# Patient Record
Sex: Female | Born: 1950 | Race: Black or African American | Hispanic: No | Marital: Single | Smoking: Never smoker
Health system: Southern US, Community
[De-identification: ages and names within clinical notes are randomized; demographics above are authoritative.]

## PROBLEM LIST (undated history)

## (undated) ENCOUNTER — Inpatient Hospital Stay: Admission: EM | Payer: Self-pay

## (undated) DIAGNOSIS — N189 Chronic kidney disease, unspecified: Secondary | ICD-10-CM

## (undated) DIAGNOSIS — Z860100 Personal history of colon polyps, unspecified: Secondary | ICD-10-CM

## (undated) DIAGNOSIS — I10 Essential (primary) hypertension: Secondary | ICD-10-CM

## (undated) DIAGNOSIS — E213 Hyperparathyroidism, unspecified: Secondary | ICD-10-CM

## (undated) DIAGNOSIS — E119 Type 2 diabetes mellitus without complications: Secondary | ICD-10-CM

## (undated) DIAGNOSIS — M1990 Unspecified osteoarthritis, unspecified site: Secondary | ICD-10-CM

## (undated) DIAGNOSIS — C541 Malignant neoplasm of endometrium: Secondary | ICD-10-CM

## (undated) DIAGNOSIS — E785 Hyperlipidemia, unspecified: Secondary | ICD-10-CM

## (undated) HISTORY — DX: Type 2 diabetes mellitus without complications: E11.9

## (undated) HISTORY — DX: Essential (primary) hypertension: I10

## (undated) HISTORY — DX: Hyperlipidemia, unspecified: E78.5

## (undated) HISTORY — PX: COLONOSCOPY: SHX174

---

## 1997-12-26 ENCOUNTER — Emergency Department (HOSPITAL_COMMUNITY): Admission: EM | Admit: 1997-12-26 | Discharge: 1997-12-26 | Payer: Self-pay | Admitting: Emergency Medicine

## 1999-08-23 ENCOUNTER — Other Ambulatory Visit: Admission: RE | Admit: 1999-08-23 | Discharge: 1999-08-23 | Payer: Self-pay | Admitting: Obstetrics and Gynecology

## 1999-09-07 ENCOUNTER — Ambulatory Visit (HOSPITAL_COMMUNITY): Admission: RE | Admit: 1999-09-07 | Discharge: 1999-09-07 | Payer: Self-pay | Admitting: Obstetrics and Gynecology

## 1999-09-07 ENCOUNTER — Encounter: Payer: Self-pay | Admitting: Obstetrics and Gynecology

## 2000-06-20 ENCOUNTER — Encounter: Payer: Self-pay | Admitting: Orthopedic Surgery

## 2000-06-20 ENCOUNTER — Encounter: Admission: RE | Admit: 2000-06-20 | Discharge: 2000-06-20 | Payer: Self-pay | Admitting: Orthopedic Surgery

## 2000-06-29 ENCOUNTER — Encounter: Payer: Self-pay | Admitting: Orthopedic Surgery

## 2000-06-29 ENCOUNTER — Encounter: Admission: RE | Admit: 2000-06-29 | Discharge: 2000-06-29 | Payer: Self-pay | Admitting: Orthopedic Surgery

## 2000-08-21 ENCOUNTER — Other Ambulatory Visit: Admission: RE | Admit: 2000-08-21 | Discharge: 2000-08-21 | Payer: Self-pay | Admitting: Obstetrics and Gynecology

## 2000-09-15 ENCOUNTER — Ambulatory Visit (HOSPITAL_COMMUNITY): Admission: RE | Admit: 2000-09-15 | Discharge: 2000-09-15 | Payer: Self-pay | Admitting: Obstetrics and Gynecology

## 2000-09-15 ENCOUNTER — Encounter: Payer: Self-pay | Admitting: Obstetrics and Gynecology

## 2000-11-07 ENCOUNTER — Encounter: Admission: RE | Admit: 2000-11-07 | Discharge: 2000-11-15 | Payer: Self-pay | Admitting: Internal Medicine

## 2001-10-26 ENCOUNTER — Encounter: Admission: RE | Admit: 2001-10-26 | Discharge: 2001-11-28 | Payer: Self-pay | Admitting: Internal Medicine

## 2002-10-25 ENCOUNTER — Other Ambulatory Visit: Admission: RE | Admit: 2002-10-25 | Discharge: 2002-10-25 | Payer: Self-pay | Admitting: Obstetrics and Gynecology

## 2002-11-13 ENCOUNTER — Ambulatory Visit (HOSPITAL_COMMUNITY): Admission: RE | Admit: 2002-11-13 | Discharge: 2002-11-13 | Payer: Self-pay | Admitting: Obstetrics and Gynecology

## 2002-11-13 ENCOUNTER — Encounter: Payer: Self-pay | Admitting: Obstetrics and Gynecology

## 2008-07-15 ENCOUNTER — Encounter (INDEPENDENT_AMBULATORY_CARE_PROVIDER_SITE_OTHER): Payer: Self-pay | Admitting: *Deleted

## 2010-09-06 ENCOUNTER — Encounter: Payer: Self-pay | Admitting: Internal Medicine

## 2012-01-12 ENCOUNTER — Encounter: Payer: Self-pay | Admitting: Internal Medicine

## 2014-06-04 ENCOUNTER — Other Ambulatory Visit (HOSPITAL_COMMUNITY): Payer: Self-pay | Admitting: Internal Medicine

## 2014-06-04 DIAGNOSIS — Z1231 Encounter for screening mammogram for malignant neoplasm of breast: Secondary | ICD-10-CM

## 2014-06-05 ENCOUNTER — Ambulatory Visit (HOSPITAL_COMMUNITY)
Admission: RE | Admit: 2014-06-05 | Discharge: 2014-06-05 | Disposition: A | Discharge status: Home / Self Care | Payer: 59 | Source: Ambulatory Visit | Attending: Internal Medicine | Admitting: Internal Medicine

## 2014-06-05 DIAGNOSIS — Z1231 Encounter for screening mammogram for malignant neoplasm of breast: Secondary | ICD-10-CM | POA: Insufficient documentation

## 2014-07-03 ENCOUNTER — Encounter: Payer: Self-pay | Admitting: Internal Medicine

## 2014-08-11 ENCOUNTER — Encounter: Payer: Self-pay | Admitting: Internal Medicine

## 2014-08-28 ENCOUNTER — Encounter: Payer: 59 | Admitting: Internal Medicine

## 2015-02-14 ENCOUNTER — Ambulatory Visit (INDEPENDENT_AMBULATORY_CARE_PROVIDER_SITE_OTHER): Payer: 59

## 2015-02-14 ENCOUNTER — Ambulatory Visit (INDEPENDENT_AMBULATORY_CARE_PROVIDER_SITE_OTHER): Payer: 59 | Admitting: Internal Medicine

## 2015-02-14 VITALS — BP 130/80 | HR 110 | Temp 98.6°F | Ht 65.5 in | Wt 209.0 lb

## 2015-02-14 DIAGNOSIS — M79675 Pain in left toe(s): Secondary | ICD-10-CM

## 2015-02-14 DIAGNOSIS — B353 Tinea pedis: Secondary | ICD-10-CM

## 2015-02-14 DIAGNOSIS — L03032 Cellulitis of left toe: Secondary | ICD-10-CM

## 2015-02-14 MED ORDER — KETOCONAZOLE 2 % EX CREA
TOPICAL_CREAM | CUTANEOUS | Status: DC
Start: 2015-02-14 — End: 2020-08-06

## 2015-02-14 MED ORDER — DOXYCYCLINE HYCLATE 100 MG PO TABS: 100.0000 mg | ORAL_TABLET | Freq: Two times a day (BID) | ORAL | Status: DC

## 2015-02-14 NOTE — Progress Notes (Addendum)
Subjective:  This chart was scribed for Tami Lin, MD by [facility], medical scribe at [facility].The patient was seen in exam room 09 and the patient's care was started at 8:47 AM.   Patient ID: Krista Johnson, female    DOB: Nov 24, 1950, 64 y.o.   MRN: [id number] Chief Complaint  Patient presents with  . Wound Check    area between left 4/5 toes-pt states "split" about 1 mo ago, treating with Neosporin-will not heal  . Foot Pain  . Diabetes   HPI  HPI Comments: Krista Johnson is a 64 y.o. female with a hx of DM who presents to [facility] complaining of a "split" at the interdigital space of the 4th and 5th toes on her left foot. This occurred one month ago. Soreness began one week ago. Pain while wearing shoes. Treating with neosporin for little relief. No numbness in her feet. Aching last night affected sleep. No fever chills/no injury. Has some web space sxt R 5toe as well  DM stable on meds w/ prim care followup.  Past Medical History  Diagnosis Date  . Allergy   . Diabetes mellitus without complication ([place])   . Hypertension    Prior to Admission medications   Medication Sig Start Date End Date Taking? Authorizing Provider  aspirin 81 MG tablet Take 81 mg by mouth daily.   Yes Historical Provider, MD  glipiZIDE (GLUCOTROL) 5 MG tablet Take 5 mg by mouth 2 (two) times daily before a meal.   Yes Historical Provider, MD  lisinopril (PRINIVIL,ZESTRIL) 5 MG tablet Take 5 mg by mouth daily.   Yes Historical Provider, MD  metFORMIN (GLUCOPHAGE) 500 MG tablet Take 500 mg by mouth 2 (two) times daily with a meal.   Yes Historical Provider, MD   Allergies  Allergen Reactions  . Sulfa Antibiotics Nausea Only   Review of Systems  Constitutional: Negative for fever, activity change, fatigue and unexpected weight change.  Musculoskeletal: Negative for back pain and arthralgias.  Neurological: Negative for numbness.      Objective:    BP 130/80 mmHg  Pulse 110  Temp(Src) 98.6 F (37 C) (Oral)  Ht 5' 5.5" (1.664 m)  Wt 209 lb (94.802 kg)  BMI 34.24 kg/m2  SpO2 98% Physical Exam  Constitutional: She is oriented to person, place, and time. She appears well-developed and well-nourished. No distress.  HENT:  Head: Normocephalic and atraumatic.  Eyes: Pupils are equal, round, and reactive to light.  Neck: Normal range of motion.  Cardiovascular: Normal rate and regular rhythm.   Pulmonary/Chest: Effort normal. No respiratory distress.  Musculoskeletal: Normal range of motion.  Swelling induration with hyperpigmentation around the left 5th MTP dorsally with significant tenderness. There is a deep fungal split in the webspace with white eschar-no pus expressible. No erythema. MTP flexes w/out pain Both ankles with inversion  Neurological: She is alert and oriented to person, place, and time.  Sensation intact toe and plantar aspect both feet with good bloodflow and no edema  Skin: Skin is warm and dry.  Psychiatric: She has a normal mood and affect. Her behavior is normal.  Nursing note and vitals reviewed. BP 130/80 mmHg  Pulse 110  Temp(Src) 98.6 F (37 C) (Oral)  Ht 5' 5.5" (1.664 m)  Wt 209 lb (94.802 kg)  BMI 34.24 kg/m2  SpO2 98% [facility] reading (PRIMARY) by  Dr. Cyan Moultrie=no bony abnormality 5th toe.      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 ketoconazole (NIZORAL) 2 % cream    Sig: Apply to web space twice a day for 1 week then daily for 1 month    Dispense:  15 g    Refill:  0  . doxycycline (VIBRA-TABS) 100 MG tablet    Sig: Take 1 tablet (100 mg total) by mouth 2 (two) times daily.    Dispense:  14 tablet    Refill:  0  We disc possible podiatry ref if not respondind and she may need debridement and then consideration for orthotics  I have completed the patient  encounter in its entirety as documented by the scribe, with editing by me where necessary. Josyah Achor P. Laney Pastor, M.D.  By signing my name below, I, Nadim Abuhashem, attest that this documentation has been prepared under the direction and in the presence of Tami Lin, MD.  Electronically Signed: Lora Havens, medical scribe. 02/14/2015, 8:49 AM.

## 2015-05-26 ENCOUNTER — Other Ambulatory Visit: Payer: Self-pay | Admitting: Internal Medicine

## 2015-05-26 DIAGNOSIS — E2839 Other primary ovarian failure: Secondary | ICD-10-CM

## 2016-05-13 DIAGNOSIS — N3000 Acute cystitis without hematuria: Secondary | ICD-10-CM | POA: Diagnosis not present

## 2016-05-13 DIAGNOSIS — Z7984 Long term (current) use of oral hypoglycemic drugs: Secondary | ICD-10-CM | POA: Diagnosis not present

## 2016-05-13 DIAGNOSIS — E1165 Type 2 diabetes mellitus with hyperglycemia: Secondary | ICD-10-CM | POA: Diagnosis not present

## 2016-05-13 DIAGNOSIS — Z6833 Body mass index (BMI) 33.0-33.9, adult: Secondary | ICD-10-CM | POA: Diagnosis not present

## 2016-05-13 DIAGNOSIS — Z79899 Other long term (current) drug therapy: Secondary | ICD-10-CM | POA: Diagnosis not present

## 2016-05-13 DIAGNOSIS — J309 Allergic rhinitis, unspecified: Secondary | ICD-10-CM | POA: Diagnosis not present

## 2016-05-13 DIAGNOSIS — Z1211 Encounter for screening for malignant neoplasm of colon: Secondary | ICD-10-CM | POA: Diagnosis not present

## 2016-05-19 DIAGNOSIS — Z7984 Long term (current) use of oral hypoglycemic drugs: Secondary | ICD-10-CM | POA: Diagnosis not present

## 2016-05-19 DIAGNOSIS — E1165 Type 2 diabetes mellitus with hyperglycemia: Secondary | ICD-10-CM | POA: Diagnosis not present

## 2016-05-19 DIAGNOSIS — Z79899 Other long term (current) drug therapy: Secondary | ICD-10-CM | POA: Diagnosis not present

## 2016-07-04 ENCOUNTER — Encounter: Payer: Self-pay | Admitting: Family Medicine

## 2016-08-30 DIAGNOSIS — M25561 Pain in right knee: Secondary | ICD-10-CM | POA: Diagnosis not present

## 2016-08-30 DIAGNOSIS — Z6833 Body mass index (BMI) 33.0-33.9, adult: Secondary | ICD-10-CM | POA: Diagnosis not present

## 2016-08-30 DIAGNOSIS — Z7984 Long term (current) use of oral hypoglycemic drugs: Secondary | ICD-10-CM | POA: Diagnosis not present

## 2016-08-30 DIAGNOSIS — E1165 Type 2 diabetes mellitus with hyperglycemia: Secondary | ICD-10-CM | POA: Diagnosis not present

## 2016-09-02 ENCOUNTER — Other Ambulatory Visit: Payer: Self-pay | Admitting: Family Medicine

## 2016-09-02 ENCOUNTER — Ambulatory Visit
Admission: RE | Admit: 2016-09-02 | Discharge: 2016-09-02 | Disposition: A | Discharge status: Home / Self Care | Payer: Medicare Other | Source: Ambulatory Visit | Attending: Family Medicine | Admitting: Family Medicine

## 2016-09-02 DIAGNOSIS — M25561 Pain in right knee: Secondary | ICD-10-CM

## 2016-09-20 DIAGNOSIS — G8929 Other chronic pain: Secondary | ICD-10-CM | POA: Insufficient documentation

## 2016-09-20 DIAGNOSIS — E119 Type 2 diabetes mellitus without complications: Secondary | ICD-10-CM | POA: Diagnosis not present

## 2016-09-20 DIAGNOSIS — M1711 Unilateral primary osteoarthritis, right knee: Secondary | ICD-10-CM | POA: Diagnosis not present

## 2016-09-20 DIAGNOSIS — M25562 Pain in left knee: Secondary | ICD-10-CM | POA: Insufficient documentation

## 2016-09-20 DIAGNOSIS — M25561 Pain in right knee: Secondary | ICD-10-CM

## 2016-10-04 DIAGNOSIS — M1711 Unilateral primary osteoarthritis, right knee: Secondary | ICD-10-CM | POA: Diagnosis not present

## 2016-10-04 DIAGNOSIS — G8929 Other chronic pain: Secondary | ICD-10-CM | POA: Diagnosis not present

## 2017-04-12 DIAGNOSIS — H25813 Combined forms of age-related cataract, bilateral: Secondary | ICD-10-CM | POA: Diagnosis not present

## 2017-04-12 DIAGNOSIS — E113293 Type 2 diabetes mellitus with mild nonproliferative diabetic retinopathy without macular edema, bilateral: Secondary | ICD-10-CM | POA: Diagnosis not present

## 2017-06-07 DIAGNOSIS — R112 Nausea with vomiting, unspecified: Secondary | ICD-10-CM | POA: Diagnosis not present

## 2017-07-12 ENCOUNTER — Encounter: Payer: Self-pay | Admitting: Podiatry

## 2017-07-12 ENCOUNTER — Ambulatory Visit (INDEPENDENT_AMBULATORY_CARE_PROVIDER_SITE_OTHER): Payer: Medicare Other | Admitting: Podiatry

## 2017-07-12 VITALS — BP 136/94 | HR 94

## 2017-07-12 DIAGNOSIS — B351 Tinea unguium: Secondary | ICD-10-CM

## 2017-07-12 DIAGNOSIS — M79675 Pain in left toe(s): Secondary | ICD-10-CM

## 2017-07-12 DIAGNOSIS — M79674 Pain in right toe(s): Secondary | ICD-10-CM | POA: Diagnosis not present

## 2017-07-12 DIAGNOSIS — E1159 Type 2 diabetes mellitus with other circulatory complications: Secondary | ICD-10-CM | POA: Diagnosis not present

## 2017-07-12 NOTE — Progress Notes (Signed)
Complaint:  Visit Type: Patient presents to the office for preventative foot care services. Complaint: Patient states" my nails have grown long and thick and become painful to walk and wear shoes" Patient has been diagnosed with DM with no foot complications. The patient presents for preventative foot care services. No changes to ROS  Podiatric Exam: Vascular: dorsalis pedis  are palpable bilateral. Posterior tibial pulses are not palpable. Capillary return is immediate. Temperature gradient is WNL. Skin turgor WNL  Sensorium: Diminished  Semmes Weinstein monofilament test. Normal tactile sensation bilaterally. Nail Exam: Pt has thick disfigured discolored nails with subungual debris noted bilateral entire nail hallux through fifth toenails Ulcer Exam: There is no evidence of ulcer or pre-ulcerative changes or infection. Orthopedic Exam: Muscle tone and strength are WNL. No limitations in general ROM. No crepitus or effusions noted. Foot type and digits show no abnormalities. Bony prominences are unremarkable. Lip[oma heels  B/L Skin: No Porokeratosis. No infection or ulcers  Diagnosis:  Onychomycosis, , Pain in right toe, pain in left toes  Treatment & Plan Procedures and Treatment: Consent by patient was obtained for treatment procedures.   Debridement of mycotic and hypertrophic toenails, 1 through 5 bilateral and clearing of subungual debris. No ulceration, no infection noted.  Return Visit-Office Procedure: Patient instructed to return to the office for a follow up visit 3 months for continued evaluation and treatment.    Gardiner Barefoot DPM

## 2017-07-27 DIAGNOSIS — M1711 Unilateral primary osteoarthritis, right knee: Secondary | ICD-10-CM | POA: Insufficient documentation

## 2017-07-27 DIAGNOSIS — G8929 Other chronic pain: Secondary | ICD-10-CM | POA: Diagnosis not present

## 2017-10-11 ENCOUNTER — Encounter: Payer: Self-pay | Admitting: Podiatry

## 2017-10-11 ENCOUNTER — Ambulatory Visit (INDEPENDENT_AMBULATORY_CARE_PROVIDER_SITE_OTHER): Payer: Medicare Other | Admitting: Podiatry

## 2017-10-11 DIAGNOSIS — B351 Tinea unguium: Secondary | ICD-10-CM | POA: Diagnosis not present

## 2017-10-11 DIAGNOSIS — M79674 Pain in right toe(s): Secondary | ICD-10-CM | POA: Diagnosis not present

## 2017-10-11 DIAGNOSIS — M79675 Pain in left toe(s): Secondary | ICD-10-CM | POA: Diagnosis not present

## 2017-10-11 DIAGNOSIS — E1159 Type 2 diabetes mellitus with other circulatory complications: Secondary | ICD-10-CM

## 2017-10-11 NOTE — Progress Notes (Signed)
Complaint:  Visit Type: Patient returns to my office for continued preventative foot care services. Complaint: Patient states" my nails have grown long and thick and become painful to walk and wear shoes" Patient has been diagnosed with DM with no foot complications. The patient presents for preventative foot care services. No changes to ROS  Podiatric Exam: Vascular: dorsalis pedis and posterior tibial pulses are palpable bilateral. Capillary return is immediate. Temperature gradient is WNL. Skin turgor WNL  Sensorium: Normal Semmes Weinstein monofilament test. Normal tactile sensation bilaterally. Nail Exam: Pt has thick disfigured discolored nails with subungual debris noted bilateral entire nail hallux through fifth toenails Ulcer Exam: There is no evidence of ulcer or pre-ulcerative changes or infection. Orthopedic Exam: Muscle tone and strength are WNL. No limitations in general ROM. No crepitus or effusions noted. Foot type and digits show no abnormalities. Bony prominences are unremarkable. Skin: No Porokeratosis. No infection or ulcers  Diagnosis:  Onychomycosis, , Pain in right toe, pain in left toes  Treatment & Plan Procedures and Treatment: Consent by patient was obtained for treatment procedures.   Debridement of mycotic and hypertrophic toenails, 1 through 5 bilateral and clearing of subungual debris. No ulceration, no infection noted. Padding dispensed fourth toe right Return Visit-Office Procedure: Patient instructed to return to the office for a follow up visit 3 months for continued evaluation and treatment.    Gardiner Barefoot DPM

## 2017-10-18 DIAGNOSIS — Z0000 Encounter for general adult medical examination without abnormal findings: Secondary | ICD-10-CM | POA: Diagnosis not present

## 2017-10-18 DIAGNOSIS — Z6832 Body mass index (BMI) 32.0-32.9, adult: Secondary | ICD-10-CM | POA: Diagnosis not present

## 2017-10-18 DIAGNOSIS — E785 Hyperlipidemia, unspecified: Secondary | ICD-10-CM | POA: Diagnosis not present

## 2017-10-18 DIAGNOSIS — Z1159 Encounter for screening for other viral diseases: Secondary | ICD-10-CM | POA: Diagnosis not present

## 2017-10-18 DIAGNOSIS — Z79899 Other long term (current) drug therapy: Secondary | ICD-10-CM | POA: Diagnosis not present

## 2017-10-18 DIAGNOSIS — Z1211 Encounter for screening for malignant neoplasm of colon: Secondary | ICD-10-CM | POA: Diagnosis not present

## 2017-10-18 DIAGNOSIS — E1165 Type 2 diabetes mellitus with hyperglycemia: Secondary | ICD-10-CM | POA: Diagnosis not present

## 2017-10-18 DIAGNOSIS — I10 Essential (primary) hypertension: Secondary | ICD-10-CM | POA: Diagnosis not present

## 2017-10-18 DIAGNOSIS — E11319 Type 2 diabetes mellitus with unspecified diabetic retinopathy without macular edema: Secondary | ICD-10-CM | POA: Diagnosis not present

## 2017-10-18 DIAGNOSIS — N950 Postmenopausal bleeding: Secondary | ICD-10-CM | POA: Diagnosis not present

## 2017-10-18 DIAGNOSIS — E210 Primary hyperparathyroidism: Secondary | ICD-10-CM | POA: Diagnosis not present

## 2017-10-23 ENCOUNTER — Encounter: Payer: Self-pay | Admitting: Gastroenterology

## 2017-10-31 ENCOUNTER — Other Ambulatory Visit: Payer: Self-pay | Admitting: Family Medicine

## 2017-10-31 DIAGNOSIS — Z1231 Encounter for screening mammogram for malignant neoplasm of breast: Secondary | ICD-10-CM

## 2017-10-31 DIAGNOSIS — E2839 Other primary ovarian failure: Secondary | ICD-10-CM

## 2017-11-14 DIAGNOSIS — R87619 Unspecified abnormal cytological findings in specimens from cervix uteri: Secondary | ICD-10-CM | POA: Diagnosis not present

## 2017-11-14 DIAGNOSIS — Z6830 Body mass index (BMI) 30.0-30.9, adult: Secondary | ICD-10-CM | POA: Diagnosis not present

## 2017-11-14 DIAGNOSIS — N950 Postmenopausal bleeding: Secondary | ICD-10-CM | POA: Diagnosis not present

## 2017-11-14 DIAGNOSIS — Z01419 Encounter for gynecological examination (general) (routine) without abnormal findings: Secondary | ICD-10-CM | POA: Diagnosis not present

## 2017-11-14 DIAGNOSIS — Z124 Encounter for screening for malignant neoplasm of cervix: Secondary | ICD-10-CM | POA: Diagnosis not present

## 2017-12-05 DIAGNOSIS — N950 Postmenopausal bleeding: Secondary | ICD-10-CM | POA: Diagnosis not present

## 2017-12-05 DIAGNOSIS — R9389 Abnormal findings on diagnostic imaging of other specified body structures: Secondary | ICD-10-CM | POA: Diagnosis not present

## 2017-12-05 DIAGNOSIS — R87619 Unspecified abnormal cytological findings in specimens from cervix uteri: Secondary | ICD-10-CM | POA: Diagnosis not present

## 2017-12-05 DIAGNOSIS — C541 Malignant neoplasm of endometrium: Secondary | ICD-10-CM | POA: Diagnosis not present

## 2017-12-05 DIAGNOSIS — D259 Leiomyoma of uterus, unspecified: Secondary | ICD-10-CM | POA: Diagnosis not present

## 2017-12-05 DIAGNOSIS — N879 Dysplasia of cervix uteri, unspecified: Secondary | ICD-10-CM | POA: Diagnosis not present

## 2017-12-08 DIAGNOSIS — C541 Malignant neoplasm of endometrium: Secondary | ICD-10-CM | POA: Diagnosis not present

## 2017-12-11 ENCOUNTER — Ambulatory Visit (AMBULATORY_SURGERY_CENTER): Payer: Self-pay

## 2017-12-11 VITALS — Ht 66.0 in | Wt 180.4 lb

## 2017-12-11 DIAGNOSIS — Z1211 Encounter for screening for malignant neoplasm of colon: Secondary | ICD-10-CM

## 2017-12-11 NOTE — Progress Notes (Signed)
No egg or soy allergy known to patient  No issues with past sedation with any surgeries  or procedures, no intubation problems  No diet pills per patient No home 02 use per patient  No blood thinners per patient  Pt denies issues with constipation  No A fib or A flutter  EMMI video sent to pt's e mail  Pt. declines 

## 2017-12-12 ENCOUNTER — Ambulatory Visit
Admission: RE | Admit: 2017-12-12 | Discharge: 2017-12-12 | Disposition: A | Discharge status: Home / Self Care | Payer: Medicare Other | Source: Ambulatory Visit | Attending: Family Medicine | Admitting: Family Medicine

## 2017-12-12 ENCOUNTER — Emergency Department (HOSPITAL_COMMUNITY)
Admission: EM | Admit: 2017-12-12 | Discharge: 2017-12-13 | Disposition: A | Discharge status: Home / Self Care | Payer: Medicare Other | Attending: Emergency Medicine | Admitting: Emergency Medicine

## 2017-12-12 ENCOUNTER — Inpatient Hospital Stay: Payer: Medicare Other

## 2017-12-12 ENCOUNTER — Inpatient Hospital Stay: Payer: Medicare Other | Attending: Gynecology | Admitting: Gynecology

## 2017-12-12 ENCOUNTER — Encounter: Payer: Self-pay | Admitting: Gynecology

## 2017-12-12 ENCOUNTER — Telehealth: Payer: Self-pay

## 2017-12-12 ENCOUNTER — Other Ambulatory Visit: Payer: Self-pay

## 2017-12-12 ENCOUNTER — Encounter (HOSPITAL_COMMUNITY): Payer: Self-pay

## 2017-12-12 VITALS — BP 119/74 | HR 108 | Temp 98.0°F | Resp 20 | Ht 66.0 in | Wt 181.7 lb

## 2017-12-12 DIAGNOSIS — Z79899 Other long term (current) drug therapy: Secondary | ICD-10-CM | POA: Diagnosis not present

## 2017-12-12 DIAGNOSIS — Z780 Asymptomatic menopausal state: Secondary | ICD-10-CM | POA: Diagnosis not present

## 2017-12-12 DIAGNOSIS — R739 Hyperglycemia, unspecified: Secondary | ICD-10-CM | POA: Insufficient documentation

## 2017-12-12 DIAGNOSIS — C541 Malignant neoplasm of endometrium: Secondary | ICD-10-CM

## 2017-12-12 DIAGNOSIS — I10 Essential (primary) hypertension: Secondary | ICD-10-CM | POA: Insufficient documentation

## 2017-12-12 DIAGNOSIS — Z1231 Encounter for screening mammogram for malignant neoplasm of breast: Secondary | ICD-10-CM

## 2017-12-12 DIAGNOSIS — E119 Type 2 diabetes mellitus without complications: Secondary | ICD-10-CM | POA: Insufficient documentation

## 2017-12-12 DIAGNOSIS — E1165 Type 2 diabetes mellitus with hyperglycemia: Secondary | ICD-10-CM | POA: Diagnosis not present

## 2017-12-12 DIAGNOSIS — D4959 Neoplasm of unspecified behavior of other genitourinary organ: Secondary | ICD-10-CM

## 2017-12-12 DIAGNOSIS — E2839 Other primary ovarian failure: Secondary | ICD-10-CM

## 2017-12-12 LAB — BASIC METABOLIC PANEL - CANCER CENTER ONLY
Anion gap: 17 — ABNORMAL HIGH (ref 5–15)
BUN: 44 mg/dL — ABNORMAL HIGH (ref 8–23)
CO2: 22 mmol/L (ref 22–32)
Calcium: 11 mg/dL — ABNORMAL HIGH (ref 8.9–10.3)
Chloride: 93 mmol/L — ABNORMAL LOW (ref 98–111)
Creatinine: 2.3 mg/dL — ABNORMAL HIGH (ref 0.44–1.00)
GFR, Est AFR Am: 24 mL/min — ABNORMAL LOW (ref >60)
GFR, Estimated: 21 mL/min — ABNORMAL LOW (ref >60)
Glucose, Bld: 477 mg/dL — ABNORMAL HIGH (ref 70–99)
Potassium: 4 mmol/L (ref 3.5–5.1)
Sodium: 132 mmol/L — ABNORMAL LOW (ref 135–145)

## 2017-12-12 LAB — BASIC METABOLIC PANEL
Anion gap: 17 — ABNORMAL HIGH (ref 5–15)
BUN: 44 mg/dL — ABNORMAL HIGH (ref 8–23)
CO2: 22 mmol/L (ref 22–32)
Calcium: 10.6 mg/dL — ABNORMAL HIGH (ref 8.9–10.3)
Chloride: 94 mmol/L — ABNORMAL LOW (ref 98–111)
Creatinine, Ser: 1.83 mg/dL — ABNORMAL HIGH (ref 0.44–1.00)
GFR calc Af Amer: 32 mL/min — ABNORMAL LOW (ref >60)
GFR calc non Af Amer: 27 mL/min — ABNORMAL LOW (ref >60)
Glucose, Bld: 454 mg/dL — ABNORMAL HIGH (ref 70–99)
Potassium: 3.7 mmol/L (ref 3.5–5.1)
Sodium: 133 mmol/L — ABNORMAL LOW (ref 135–145)

## 2017-12-12 LAB — URINALYSIS, ROUTINE W REFLEX MICROSCOPIC
Bacteria, UA: NONE SEEN
Bilirubin Urine: NEGATIVE
Glucose, UA: >=500 mg/dL — AB
Ketones, ur: 20 mg/dL — AB
Leukocytes, UA: NEGATIVE
Nitrite: NEGATIVE
Protein, ur: NEGATIVE mg/dL
Specific Gravity, Urine: 1.022 (ref 1.005–1.030)
pH: 5 (ref 5.0–8.0)

## 2017-12-12 LAB — CBC
HCT: 37 % (ref 36.0–46.0)
Hemoglobin: 12.2 g/dL (ref 12.0–15.0)
MCH: 27.2 pg (ref 26.0–34.0)
MCHC: 33 g/dL (ref 30.0–36.0)
MCV: 82.6 fL (ref 78.0–100.0)
Platelets: 287 10*3/uL (ref 150–400)
RBC: 4.48 MIL/uL (ref 3.87–5.11)
RDW: 13 % (ref 11.5–15.5)
WBC: 5.8 10*3/uL (ref 4.0–10.5)

## 2017-12-12 LAB — CBG MONITORING, ED
Glucose-Capillary: 411 mg/dL — ABNORMAL HIGH (ref 70–99)
Glucose-Capillary: 384 mg/dL — ABNORMAL HIGH (ref 70–99)
Glucose-Capillary: 231 mg/dL — ABNORMAL HIGH (ref 70–99)

## 2017-12-12 LAB — I-STAT CHEM 8, ED
BUN: 34 mg/dL — ABNORMAL HIGH (ref 8–23)
Calcium, Ion: 1.28 mmol/L (ref 1.15–1.40)
Chloride: 102 mmol/L (ref 98–111)
Creatinine, Ser: 1.5 mg/dL — ABNORMAL HIGH (ref 0.44–1.00)
Glucose, Bld: 223 mg/dL — ABNORMAL HIGH (ref 70–99)
HCT: 35 % — ABNORMAL LOW (ref 36.0–46.0)
Hemoglobin: 11.9 g/dL — ABNORMAL LOW (ref 12.0–15.0)
Potassium: 3 mmol/L — ABNORMAL LOW (ref 3.5–5.1)
Sodium: 138 mmol/L (ref 135–145)
TCO2: 22 mmol/L (ref 22–32)

## 2017-12-12 MED ORDER — INSULIN ASPART 100 UNIT/ML IV SOLN
4.0000 [IU] | Freq: Once | INTRAVENOUS | Status: AC
  Administered 2017-12-12: 4 [IU] via INTRAVENOUS
  Filled 2017-12-12: qty 0.04

## 2017-12-12 MED ORDER — INSULIN ASPART 100 UNIT/ML ~~LOC~~ SOLN
4.0000 [IU] | Freq: Once | SUBCUTANEOUS | Status: AC
  Administered 2017-12-12: 4 [IU] via SUBCUTANEOUS
  Filled 2017-12-12: qty 1

## 2017-12-12 MED ORDER — SODIUM CHLORIDE 0.9 % IV BOLUS
1000.0000 mL | Freq: Once | INTRAVENOUS | Status: AC
  Administered 2017-12-12: 1000 mL via INTRAVENOUS

## 2017-12-12 MED ORDER — INSULIN ASPART 100 UNIT/ML IV SOLN
3.0000 [IU] | Freq: Once | INTRAVENOUS | Status: AC
  Administered 2017-12-13: 3 [IU] via INTRAVENOUS
  Filled 2017-12-12: qty 0.03

## 2017-12-12 MED ORDER — BLOOD GLUCOSE MONITOR KIT: PACK | 0 refills | Status: DC

## 2017-12-12 NOTE — Patient Instructions (Signed)
Plan to have a CT scan of the chest, abdomen, and pelvis to evaluate for spread of the cancer.  We will contact you with the results.                  Preparing for your Surgery  Plan for surgery on December 19, 2017 with Dr. Everitt Amber at [facility] will be scheduled for a robotic assisted total laparoscopic hysterectomy, bilateral salpingo-oophorectomy, sentinel lymph node biopsy.  Pre-operative Testing -You will receive a phone call from presurgical testing at [facility] to arrange for a pre-operative testing appointment before your surgery.  This appointment normally occurs one to two weeks before your scheduled surgery.   -Bring your insurance card, copy of an advanced directive if applicable, medication list  -At that visit, you will be asked to sign a consent for a possible blood transfusion in case a transfusion becomes necessary during surgery.  The need for a blood transfusion is rare but having consent is a necessary part of your care.    -STOP TAKING ASPIRIN NOW. You should not be taking blood thinners or aspirin at least ten days prior to surgery unless instructed by your surgeon.  Day Before Surgery at [place] will be asked to take in a light diet the day before surgery.  Avoid carbonated beverages.  You will be advised to have nothing to eat or drink after midnight the evening before.    Eat a light diet the day before surgery.  Examples including soups, broths, toast, yogurt, mashed potatoes.  Things to avoid include carbonated beverages (fizzy beverages), raw fruits and raw vegetables, or beans.   If your bowels are filled with gas, your surgeon will have difficulty visualizing your pelvic organs which increases your surgical risks.  Your role in recovery Your role is to become active as soon as directed by your doctor, while still giving yourself time to heal.  Rest when you feel tired. You will be asked to do the following in order to speed your  recovery:  - Cough and breathe deeply. This helps toclear and expand your lungs and can prevent pneumonia. You may be given a spirometer to practice deep breathing. A staff member will show you how to use the spirometer. - Do mild physical activity. Walking or moving your legs help your circulation and body functions return to normal. A staff member will help you when you try to walk and will provide you with simple exercises. Do not try to get up or walk alone the first time. - Actively manage your pain. Managing your pain lets you move in comfort. We will ask you to rate your pain on a scale of zero to 10. It is your responsibility to tell your doctor or nurse where and how much you hurt so your pain can be treated.  Special Considerations -If you are diabetic, you may be placed on insulin after surgery to have closer control over your blood sugars to promote healing and recovery.  This does not mean that you will be discharged on insulin.  If applicable, your oral antidiabetics will be resumed when you are tolerating a solid diet.  -Your final pathology results from surgery should be available by the Friday after surgery and the results will be relayed to you when available.  -Dr. Lahoma Crocker is the Surgeon that assists your GYN Oncologist with surgery.  The next day after your surgery you will either see your GYN Oncologist  or Dr. Lahoma Crocker.   Blood Transfusion Information WHAT IS A BLOOD TRANSFUSION? A transfusion is the replacement of blood or some of its parts. Blood is made up of multiple cells which provide different functions.  Red blood cells carry oxygen and are used for blood loss replacement.  White blood cells fight against infection.  Platelets control bleeding.  Plasma helps clot blood.  Other blood products are available for specialized needs, such as hemophilia or other clotting disorders. BEFORE THE TRANSFUSION  Who gives blood for transfusions?   You  may be able to donate blood to be used at a later date on yourself (autologous donation).  Relatives can be asked to donate blood. This is generally not any safer than if you have received blood from a stranger. The same precautions are taken to ensure safety when a relative's blood is donated.  Healthy volunteers who are fully evaluated to make sure their blood is safe. This is blood bank blood. Transfusion therapy is the safest it has ever been in the practice of medicine. Before blood is taken from a donor, a complete history is taken to make sure that person has no history of diseases nor engages in risky social behavior (examples are intravenous drug use or sexual activity with multiple partners). The donor's travel history is screened to minimize risk of transmitting infections, such as malaria. The donated blood is tested for signs of infectious diseases, such as HIV and hepatitis. The blood is then tested to be sure it is compatible with you in order to minimize the chance of a transfusion reaction. If you or a relative donates blood, this is often done in anticipation of surgery and is not appropriate for emergency situations. It takes many days to process the donated blood. RISKS AND COMPLICATIONS Although transfusion therapy is very safe and saves many lives, the main dangers of transfusion include:   Getting an infectious disease.  Developing a transfusion reaction. This is an allergic reaction to something in the blood you were given. Every precaution is taken to prevent this. The decision to have a blood transfusion has been considered carefully by your caregiver before blood is given. Blood is not given unless the benefits outweigh the risks.

## 2017-12-12 NOTE — ED Provider Notes (Signed)
[facility] DEPT Provider Note   CSN: [id number] Arrival date & time: 12/12/17  1721     History   Chief Complaint Chief Complaint  Patient presents with  . Hyperglycemia    HPI Krista Johnson is a 67 y.o. female with PMH/o DM, HLD, HTN who presents for evaluation of high blood sugar.  Patient reports that she is diabetic and was previously on metformin and glipizide.  She reports that she was told to stop her metformin and glipizide and transition to [place] in preparation for hysterectomy which she is getting next week and because her kidney function was elevated.  Patient reports she has been off of her medications for approximately 5 to 6 weeks.  She reports that today, she was getting preop labs and states that her blood sugar was reading as high and they told her to come to the emergency department for further evaluation.  Patient reports she never got the [place] filled because the co-pay was too expensive.  She has not been on any medications since discontinuing her metformin and glipizide.  She has not had any abdominal pain or nausea/vomiting.  She reports that she has noticed she has felt fatigued than usual but otherwise has been in her normal state of health.  Patient denies any fevers, chest pain, difficulty breathing, abdominal pain, nausea/vomiting, dysuria, hematuria.  The history is provided by the patient.    Past Medical History:  Diagnosis Date  . Allergy    as achild  . Diabetes mellitus without complication ([place])   . Hyperlipidemia   . Hypertension     Patient Active Problem List   Diagnosis Date Noted  . Primary osteoarthritis of right knee 07/27/2017  . Chronic pain of right knee 09/20/2016    Past Surgical History:  Procedure Laterality Date  . COLONOSCOPY       OB History   None      Home Medications    Prior to Admission medications   Medication Sig Start Date End Date Taking? Authorizing Provider    lisinopril-hydrochlorothiazide (PRINZIDE,ZESTORETIC) 20-25 MG tablet Take 1 tablet by mouth daily.  07/09/17  Yes [provider]  simvastatin (ZOCOR) 10 MG tablet Take 10 mg by mouth daily.  07/09/17  Yes [provider]  VITAMIN D, ERGOCALCIFEROL, PO Take 2,000 Units by mouth every 7 (seven) days.  10/23/17  Yes [provider]  blood glucose meter kit and supplies KIT Dispense based on patient and insurance preference. Use up to four times daily as directed. (FOR ICD-9 250.00, 250.01). 12/12/17   Volanda Napoleon, PA-C  ketoconazole (NIZORAL) 2 % cream Apply to web space twice a day for 1 week then daily for 1 month Patient not taking: Reported on 12/12/2017 02/14/15   Leandrew Koyanagi, MD    Family History Family History  Problem Relation Age of Onset  . Diabetes Mother   . Hypertension Mother   . Diabetes Sister   . Hypertension Sister   . Diabetes Maternal Uncle   . Colon cancer Paternal Aunt   . Diabetes Paternal Aunt   . Stomach cancer Neg Hx   . Rectal cancer Neg Hx   . Esophageal cancer Neg Hx   . Colon polyps Neg Hx     Social History Social History   Tobacco Use  . Smoking status: Never Smoker  . Smokeless tobacco: Never Used  Substance Use Topics  . Alcohol use: Not Currently    Alcohol/week: 0.0 standard  drinks    Frequency: Never    Comment: Only when she was younger, no drink in forty years  . Drug use: Never     Allergies   Sulfa antibiotics and Sulfamethoxazole   Review of Systems Review of Systems  Constitutional: Positive for fatigue. Negative for fever.  Respiratory: Negative for cough and shortness of breath.   Cardiovascular: Negative for chest pain.  Gastrointestinal: Negative for abdominal pain, nausea and vomiting.  Genitourinary: Negative for dysuria and hematuria.  Neurological: Negative for headaches.  All other systems reviewed and are negative.    Physical Exam Updated Vital Signs BP 134/73 (BP  Location: Right Arm)   Pulse 94   Temp 97.8 F (36.6 C) (Oral)   Resp 17   Ht _0  (1.676 m)   Wt 82.1 kg   SpO2 100%   BMI 29.21 kg/m   Physical Exam  Constitutional: She is oriented to person, place, and time. She appears well-developed and well-nourished.  HENT:  Head: Normocephalic and atraumatic.  Mouth/Throat: Oropharynx is clear and moist and mucous membranes are normal.  Eyes: Pupils are equal, round, and reactive to light. Conjunctivae, EOM and lids are normal.  Neck: Full passive range of motion without pain.  Cardiovascular: Normal rate, regular rhythm, normal heart sounds and normal pulses. Exam reveals no gallop and no friction rub.  No murmur heard. Pulmonary/Chest: Effort normal and breath sounds normal.  Lungs clear to auscultation bilaterally.  Symmetric chest rise.  No wheezing, rales, rhonchi.  Abdominal: Soft. Normal appearance. There is no tenderness. There is no rigidity and no guarding.  Abdomen is soft, non-distended, non-tender. No rigidity, No guarding. No peritoneal signs.  Musculoskeletal: Normal range of motion.  Neurological: She is alert and oriented to person, place, and time.  Skin: Skin is warm and dry. Capillary refill takes less than 2 seconds.  Psychiatric: She has a normal mood and affect. Her speech is normal.  Nursing note and vitals reviewed.    ED Treatments / Results  Labs (all labs ordered are listed, but only abnormal results are displayed) Labs Reviewed  BASIC METABOLIC PANEL - Abnormal; Notable for the following components:      Result Value   Sodium 133 (*)    Chloride 94 (*)    Glucose, Bld 454 (*)    BUN 44 (*)    Creatinine, Ser 1.83 (*)    Calcium 10.6 (*)    GFR calc non Af Amer 27 (*)    GFR calc Af Amer 32 (*)    Anion gap 17 (*)    All other components within normal limits  URINALYSIS, ROUTINE W REFLEX MICROSCOPIC - Abnormal; Notable for the following components:   Color, Urine STRAW (*)    Glucose, UA >=500  (*)    Hgb urine dipstick MODERATE (*)    Ketones, ur 20 (*)    All other components within normal limits  CBG MONITORING, ED - Abnormal; Notable for the following components:   Glucose-Capillary 411 (*)    All other components within normal limits  CBG MONITORING, ED - Abnormal; Notable for the following components:   Glucose-Capillary 384 (*)    All other components within normal limits  CBG MONITORING, ED - Abnormal; Notable for the following components:   Glucose-Capillary 231 (*)    All other components within normal limits  I-STAT CHEM 8, ED - Abnormal; Notable for the following components:   Potassium 3.0 (*)    BUN 34 (*)  Creatinine, Ser 1.50 (*)    Glucose, Bld 223 (*)    Hemoglobin 11.9 (*)    HCT 35.0 (*)    All other components within normal limits  CBC  CBG MONITORING, ED    EKG None  Radiology Dg Bone Density (dxa)  Result Date: 12/12/2017 EXAM: DUAL X-RAY ABSORPTIOMETRY (DXA) FOR BONE MINERAL DENSITY IMPRESSION: Referring Physician:  Jonathon Jordan Your patient completed a BMD test using Lunar IDXA DXA system ( analysis version: 16 ) manufactured by [employer]. PATIENT: Name: Youlanda, Tomassetti Patient ID:  [id number] Birth Date: Apr 30, 1950 Height:     65.5 in.     Weight:     178.9 lbs. Sex:         Female Measured:   12/12/2017 Indications: Estrogen Deficient, Family Hist. (Parent hip fracture), Low Calcium Intake (269.3), Postmenopausal Fractures: None Treatments: Vitamin D (E933.5) ASSESSMENT: The BMD measured at Femur Neck Right is 1.033 g/cm2 with a T-score of 0.0. This patient is considered NORMAL according to [facility]) criteria. The scan quality is good. Site Region Measured Date Measured Age YA T-score BMD Significant CHANGE DualFemur Neck Right 12/12/2017    67.0         0.0     1.033 g/cm2 AP Spine  L1-L4      12/12/2017    67.0         0.4     1.230 g/cm2 DualFemur Total Mean 12/12/2017    67.0         0.3     1.044 g/cm2 World Health  Organization [facility]) criteria for post-menopausal, Caucasian Women: Normal       T-score at or above -1 SD Osteopenia   T-score between -1 and -2.5 SD Osteoporosis T-score at or below -2.5 SD RECOMMENDATION: 1. All patients should optimize calcium and vitamin D intake. 2. Consider FDA approved medical therapies in postmenopausal women and men aged 43 years and older, based on the following: a. A hip or vertebral (clinical or morphometric) fracture b. T- score < or = -2.5 at the femoral neck or spine after appropriate evaluation to exclude secondary causes c. Low bone mass (T-score between -1.0 and -2.5 at the femoral neck or spine) and a 10 year probability of a hip fracture > or = 3% or a 10 year probability of a major osteoporosis-related fracture > or = 20% based on the [place]-adapted WHO algorithm d. Clinician judgment and/or patient preferences may indicate treatment for people with 10-year fracture probabilities above or below these levels FOLLOW-UP: People with diagnosed cases of osteoporosis or at high risk for fracture should have regular bone mineral density tests. For patients eligible for Medicare, routine testing is allowed once every 2 years. The testing frequency can be increased to one year for patients who have rapidly progressing disease, those who are receiving or discontinuing medical therapy to restore bone mass, or have additional risk factors. I have reviewed this report and agree with the above findings. Mark A. Thornton Papas, M.D. [facility] Radiology Electronically Signed   By: Lavonia [name] M.D.   On: 12/12/2017 12:08   Mm 3d Screen Breast Bilateral  Result Date: 12/12/2017 CLINICAL DATA:  Screening. EXAM: DIGITAL SCREENING BILATERAL MAMMOGRAM WITH TOMO AND CAD COMPARISON:  Previous exam(s). ACR Breast Density Category b: There are scattered areas of fibroglandular density. FINDINGS: In the right axilla, a possible mass warrants further evaluation. In the left breast, no findings suspicious for  malignancy. Images were processed with CAD.  IMPRESSION: Further evaluation is suggested for possible mass in the right axilla. RECOMMENDATION: Ultrasound of the right axilla. (Code:[id number]) The patient will be contacted regarding the findings, and additional imaging will be scheduled. BI-RADS CATEGORY  0: Incomplete. Need additional imaging evaluation and/or prior mammograms for comparison. Electronically Signed   By: Lovey Newcomer M.D.   On: 12/12/2017 16:28    Procedures Procedures (including critical care time)  Medications Ordered in ED Medications  insulin aspart (novoLOG) injection 3 Units (has no administration in time range)  sodium chloride 0.9 % bolus 1,000 mL (0 mLs Intravenous Stopped 12/12/17 2105)  insulin aspart (novoLOG) injection 4 Units (4 Units Subcutaneous Given 12/12/17 2016)  sodium chloride 0.9 % bolus 1,000 mL (0 mLs Intravenous Stopped 12/12/17 2339)  insulin aspart (novoLOG) injection 4 Units (4 Units Intravenous Given 12/12/17 2131)     Initial Impression / Assessment and Plan / ED Course  I have reviewed the triage vital signs and the nursing notes.  Pertinent labs & imaging results that were available during my care of the patient were reviewed by me and considered in my medical decision making (see chart for details).  Clinical Course as of Dec 14 39  Tue Aug 27, 525  [id number] 67 year old female here with elevated blood sugars.  Her creatinine was also quite elevated.  She is gotten some IV fluids and otherwise is nontoxic-appearing so I think we could repeat her lab work and show that her creatinine is improved she might build to go home.   [MB]    Clinical Course User Index [MB] Hayden Rasmussen, MD    67 y.o. F with PMH/o HTN, HLD who presents for evaluation of hyperglycemia.  Patient states she was previously on metformin and glipizide but was told to transition to [place].  She states that she was told that her kidney function was starting to elevate to  start the metformin.  She reports she was unable to get the medication due to insurance issues.  No fever, abdominal pain, nausea/vomiting. Patient is afebrile, non-toxic appearing, sitting comfortably on examination table. Vital signs reviewed and stable.  Will plan to check basic labs.  BMP from earlier today shows bicarb of 57.  Glucose of 477.  BUN is 44 and creatinine is 2.30.  BMP here shows bicarb of 22, glucose of 454.  BUN is 44, creatinine is 1.83.  CBC shows no leukocytosis or anemia.  UA does show ketones.  Her BUN and creatinine is slightly elevated.  It showed earlier today from her blood work, her creatinine was 2.3.  Patient does not appear to be in DKA as her bicarb is normal.  I suspect her hyperglycemia secondary to medication noncompliance rather than infectious or DKA etiology. Discussed patient with Dr. Melina Copa.  Will give additional insulin and fluids and repeat her her BMP.  If creatinine, BUN and glucose continued to improve, patient can be recently discharged home.  Insulin provided in the ED.  Repeat BMP shows potassium 3.0.  BUN is improved to 34 creatinine is improved at 1.50.  Glucose is down to 223.  I discussed treatment options with pharmacy.  Given concern for elevated creatinine, there are no oral medications besides Tyler Aas that could be indicated for patient.  He recommends starting patient on insulin.  Discussed results with patient.  She is not currently having any abdominal pain, nausea/vomiting.  She currently not having any symptoms.  Vital signs are stable.  Repeat abdominal exam is benign.  At this  time, given improvement in his creatinine, BUN and improvement in glucose, feel that patient can be recently discharged home.  We discussed at length regarding starting her on insulin.  Patient is agreeable.  We will plan to give her additional dose of insulin prior to discharge.  Instructed her to call her primary care doctor tomorrow to get prescribed insulin so that she  can start taking it immediately.  Structured to return to emergency department immediately if she cannot get her prescription or she has any worsening or concerning symptoms.  This time, patient is hemodynamically stable and does not appear to be in DKA. Patient had ample opportunity for questions and discussion. All patient's questions were answered with full understanding. Strict return precautions discussed. Patient expresses understanding and agreement to plan.   Final Clinical Impressions(s) / ED Diagnoses   Final diagnoses:  Hyperglycemia    ED Discharge Orders         Ordered    blood glucose meter kit and supplies KIT     12/12/17 2352           Volanda Napoleon, PA-C 12/13/17 0041    Hayden Rasmussen, MD 12/13/17 [phone]

## 2017-12-12 NOTE — ED Triage Notes (Addendum)
Patient reports that she went to have pre surgery labs completed and was told her blood sugar was high. Patient states she has not had any diabetic meds x 6 weeks because she was told to stop Metformin and Glipizide for procedures. Patient states that she was prescribed tresiba but could not afford the co pay and PCP was suppose to call in something else, but has not.  CBG in triage-411.

## 2017-12-12 NOTE — Addendum Note (Signed)
Addended by: Baruch Merl on: 12/12/2017 04:56 PM   Modules accepted: Orders

## 2017-12-12 NOTE — Telephone Encounter (Signed)
Told Krista Johnson that her blood sugar was 477 today at 1530 and her kidney function.was decreased.  Her Creatine was 2.30. Krista Johnson stated that her PCP took her off her diabetic meds and wanted her to take Trulicity.  Pt has not taken as she cannot afford the co-pay. [name], NP recommends her to go to the ED at [facility].   Pt agreeable to go to the ED.

## 2017-12-12 NOTE — Progress Notes (Signed)
Consult Note: Gyn-Onc   Krista Johnson 67 y.o. female  No chief complaint on file.   Assessment : High-grade endometrial carcinoma.  Plan: The natural history of endometrial carcinoma was discussed with the patient.  I would recommend she undergo robotic hysterectomy, BSO and sentinel node biopsies.  Risks of surgery were discussed and all questions were answered.  Patient is aware that based on final pathology adjuvant therapy with either radiation and/or chemotherapy might be recommended.  We will plan a preoperative CT scan of the chest abdomen pelvis.  Patient understands that Dr. Everitt Amber will be her primary surgeon on December 19, 2017.  HPI: 67 year old Afro-American female seen in consultation at the request of Dr. Lorriane Shire a good regarding management of a newly diagnosed high-grade endometrial carcinoma.  The patient noted some postmenopausal bleeding and was promptly seen by Dr. 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  Patient denies any other pelvic symptoms and denies any GI or GU problems.  Review of Systems:10 point review of systems is negative except as noted in interval history.   Vitals: Blood pressure 119/74, pulse (!) 108, temperature 98 F (36.7 C), temperature source Oral, resp. rate 20, height 5\' 6"  (1.676 m), weight 181 lb 11.2 oz (82.4 kg), SpO2 100 %.  Physical Exam: General : The patient is a healthy woman in no acute distress.  HEENT: normocephalic, extraoccular movements normal; neck is supple without thyromegally  Lynphnodes: Supraclavicular and inguinal nodes not enlarged  Abdomen: Soft, non-tender, no ascites, no organomegally, no masses, no hernias  Pelvic:  EGBUS: Normal female  Vagina: Normal, no lesions  Urethra and Bladder: Normal, non-tender  Cervix: Normal no lesions are noted Uterus: Difficult to assess given the patient's habitus. Bi-manual examination:  Non-tender; no adenxal masses or nodularity    Lower extremities: No edema or varicosities. Normal range of motion      Allergies  Allergen Reactions  . Sulfa Antibiotics Nausea Only  . Sulfamethoxazole Nausea Only    Past Medical History:  Diagnosis Date  . Allergy    as achild  . Diabetes mellitus without complication ([place])   . Hyperlipidemia   . Hypertension     Past Surgical History:  Procedure Laterality Date  . COLONOSCOPY      Current Outpatient Medications  Medication Sig Dispense Refill  . aspirin 81 MG tablet Take 81 mg by mouth daily.    Marland Kitchen ketoconazole (NIZORAL) 2 % cream Apply to web space twice a day for 1 week then daily for 1 month (Patient taking differently: as needed. Apply to web space twice a day for 1 week then daily for 1 month, as needed.) 15 g 0  . lisinopril-hydrochlorothiazide (PRINZIDE,ZESTORETIC) 20-25 MG tablet     . simvastatin (ZOCOR) 10 MG tablet     . Vitamin D, Ergocalciferol, (DRISDOL) 50000 units CAPS capsule TAKE 1 CAPSULE BY MOUTH ONCE A WEEK FOR 8 WEEKS ONLY  0  . Dulaglutide (TRULICITY Lead) Inject into the skin.     No current facility-administered medications for this visit.     Social History   Socioeconomic History  . Marital status: Single    Spouse name: Not on file  . Number of children: Not on file  . Years of education: Not on file  . Highest education level: Not on file  Occupational History  . Not on file  Social Needs  . Financial resource strain: Not on file  . Food  insecurity:    Worry: Not on file    Inability: Not on file  . Transportation needs:    Medical: Not on file    Non-medical: Not on file  Tobacco Use  . Smoking status: Never Smoker  . Smokeless tobacco: Never Used  Substance and Sexual Activity  . Alcohol use: Not Currently    Alcohol/week: 0.0 standard drinks    Frequency: Never    Comment: Only when she was younger, no drink in forty years  . Drug use: Never  . Sexual activity: Not on  file  Lifestyle  . Physical activity:    Days per week: Not on file    Minutes per session: Not on file  . Stress: Not on file  Relationships  . Social connections:    Talks on phone: Not on file    Gets together: Not on file    Attends religious service: Not on file    Active member of club or organization: Not on file    Attends meetings of clubs or organizations: Not on file    Relationship status: Not on file  . Intimate partner violence:    Fear of current or ex partner: Not on file    Emotionally abused: Not on file    Physically abused: Not on file    Forced sexual activity: Not on file  Other Topics Concern  . Not on file  Social History Narrative  . Not on file    Family History  Problem Relation Age of Onset  . Diabetes Mother   . Hypertension Mother   . Diabetes Sister   . Hypertension Sister   . Diabetes Maternal Uncle   . Colon cancer Paternal Aunt   . Diabetes Paternal Aunt   . Stomach cancer Neg Hx   . Rectal cancer Neg Hx   . Esophageal cancer Neg Hx   . Colon polyps Neg Hx       Marti Sleigh, MD 12/12/2017, 2:56 PM

## 2017-12-12 NOTE — Discharge Instructions (Signed)
As we discussed, your blood sugar here today was high.  Because your kidney function is elevated, metformin is no longer good option.  Additionally, the trace [place] is expensive.  Because of this, you will need to be started on insulin.  Please call your primary care doctor tomorrow morning to get a prescription for insulin.  Tell them that she was seen in the emergency department last night for high blood sugar.  Return to emergency department for any abdominal pain, nausea/vomiting, fever, chest pain, difficulty breathing or any other worsening or concerning symptoms.

## 2017-12-13 ENCOUNTER — Telehealth: Payer: Self-pay | Admitting: Oncology

## 2017-12-13 ENCOUNTER — Encounter: Payer: Self-pay | Admitting: Oncology

## 2017-12-13 ENCOUNTER — Telehealth: Payer: Self-pay | Admitting: *Deleted

## 2017-12-13 ENCOUNTER — Other Ambulatory Visit: Payer: Self-pay | Admitting: Family Medicine

## 2017-12-13 DIAGNOSIS — N289 Disorder of kidney and ureter, unspecified: Secondary | ICD-10-CM | POA: Diagnosis not present

## 2017-12-13 DIAGNOSIS — C541 Malignant neoplasm of endometrium: Secondary | ICD-10-CM

## 2017-12-13 DIAGNOSIS — E1165 Type 2 diabetes mellitus with hyperglycemia: Secondary | ICD-10-CM | POA: Diagnosis not present

## 2017-12-13 DIAGNOSIS — R928 Other abnormal and inconclusive findings on diagnostic imaging of breast: Secondary | ICD-10-CM

## 2017-12-13 DIAGNOSIS — R739 Hyperglycemia, unspecified: Secondary | ICD-10-CM | POA: Diagnosis not present

## 2017-12-13 NOTE — Telephone Encounter (Signed)
Patient called asking if she can restart her aspirin and when to stop it before surgery. Left her a message saying that she can restart the aspirin now and stop it 10 days before surgery which is tentatively planned for 01/23/18.

## 2017-12-13 NOTE — Telephone Encounter (Signed)
Called patient to verify that Dr. Myer Peer is her PCP.  She said yes and that Dr. Stephanie Acre was recommending Tyler Aas to manage her diabetes but she is not able to afford it.  She said it is $600 per month.  Advised her that I will call Dr. Stephanie Acre' office to try to get her an appointment today.  Called Dr. Stephanie Acre' office to schedule appointment for patient today to manage patient's blood sugar.  Appointment scheduled for 4:30 pm with Dr. Orland Mustard.  Also spoke to Dr. Stephanie Acre' nurse, Summer, and advised her of the situation with affording Tyler Aas and that patient will need to be optimized for surgery in a month for endometrial cancer.  She said that she will let Dr. Stephanie Acre know and that they have samples of Tyler Aas that they can provide until patient is changed to a medication that her insurance will cover.  She recommended that she keep the appointment with Dr. Orland Mustard today.  Called Selina back and advised of appointment time today at 4:30 pm with Dr. Orland Mustard.  Also notified her that the CT scan has been changed to 12/22/17 at 3 pm.  Discussed that it is OK for her to have the colonoscopy on 12/25/17 and that an appointment with Dr. Denman George has been scheduled for 01/12/18.  She verbalized agreement and understanding.

## 2017-12-13 NOTE — Telephone Encounter (Signed)
[facility] consulted in regards to medication assistance.  Pt has insurance coverage and is not eligible for [facility] program.

## 2017-12-15 ENCOUNTER — Ambulatory Visit (HOSPITAL_COMMUNITY): Payer: Medicare Other

## 2017-12-15 ENCOUNTER — Telehealth: Payer: Self-pay | Admitting: Oncology

## 2017-12-15 NOTE — Telephone Encounter (Signed)
Left a message for medical records at [facility] for patient's office notes and lab work.

## 2017-12-19 ENCOUNTER — Telehealth: Payer: Self-pay | Admitting: Oncology

## 2017-12-19 ENCOUNTER — Telehealth: Payer: Self-pay | Admitting: Endocrinology

## 2017-12-19 NOTE — Telephone Encounter (Signed)
NEW Patient has appt on 9/10 (rescheduled from 9/30). Patient went to the emergency room 8/27 because blood sugars were high (running in the 500's). PCP put her on Treciba (sugars are now 300-400, eyes blurry, dizzy, thirsty). Patient would like to come in sooner than 9/10. PA from the hospital told patient she needs treatment asap.

## 2017-12-19 NOTE — Telephone Encounter (Signed)
315 tomorrow

## 2017-12-19 NOTE — Telephone Encounter (Signed)
Krista Johnson called and said her blood sugars are still running high (in the 300's) with taking [place].  She is worried about blood sugar control for her upcoming surgery.  She has an appointment with Dr. Loanne Drilling in endocrinology on 12/26/17 but would like to be seen sooner to get her blood sugar under control.   Called Dr. Cordelia Pen office and they said [name] has called them and a note was sent to the doctor to see if her appointment can be moved up.  Called Petra back and let her know that a message has been sent to Dr. Loanne Drilling.  She also was wondering if she should have the CT scan on Friday with her high blood sugar.  Advised her that she still needs to have the CT scan on Friday.  She verbalized understanding and agreement.

## 2017-12-19 NOTE — Telephone Encounter (Signed)
Please advise on below  

## 2017-12-20 NOTE — Telephone Encounter (Signed)
Please schedule

## 2017-12-21 NOTE — Telephone Encounter (Signed)
LMTCB-appt available Friday 12/21/17

## 2017-12-22 ENCOUNTER — Telehealth: Payer: Self-pay | Admitting: Gastroenterology

## 2017-12-22 ENCOUNTER — Ambulatory Visit (HOSPITAL_COMMUNITY)
Admission: RE | Admit: 2017-12-22 | Discharge: 2017-12-22 | Disposition: A | Discharge status: Home / Self Care | Payer: Medicare Other | Source: Ambulatory Visit | Attending: Gynecologic Oncology | Admitting: Gynecologic Oncology

## 2017-12-22 DIAGNOSIS — R911 Solitary pulmonary nodule: Secondary | ICD-10-CM | POA: Diagnosis not present

## 2017-12-22 DIAGNOSIS — R590 Localized enlarged lymph nodes: Secondary | ICD-10-CM | POA: Insufficient documentation

## 2017-12-22 DIAGNOSIS — I700 Atherosclerosis of aorta: Secondary | ICD-10-CM | POA: Diagnosis not present

## 2017-12-22 DIAGNOSIS — D4959 Neoplasm of unspecified behavior of other genitourinary organ: Secondary | ICD-10-CM

## 2017-12-22 DIAGNOSIS — C541 Malignant neoplasm of endometrium: Secondary | ICD-10-CM | POA: Insufficient documentation

## 2017-12-22 LAB — POCT I-STAT CREATININE: Creatinine, Ser: 1.9 mg/dL — ABNORMAL HIGH (ref 0.44–1.00)

## 2017-12-22 MED ORDER — IOHEXOL 300 MG/ML  SOLN
75.0000 mL | Freq: Once | INTRAMUSCULAR | Status: AC | PRN
  Administered 2017-12-22: 60 mL via INTRAVENOUS

## 2017-12-22 NOTE — Telephone Encounter (Signed)
I spoke with the previist nurses and called the patient.  I explained to the patient that she is to hold her novalog the evening dose the day before, and hold the morning of the procedure.   She is to take half of the tresiba the evening before.  If she takes it in the morning, she should take it the morning before the procedure.   She wrote it down, and understands her instructions.

## 2017-12-25 ENCOUNTER — Ambulatory Visit (AMBULATORY_SURGERY_CENTER): Payer: Medicare Other | Admitting: Gastroenterology

## 2017-12-25 ENCOUNTER — Encounter: Payer: Self-pay | Admitting: Gastroenterology

## 2017-12-25 VITALS — BP 100/50 | HR 98 | Temp 97.8°F | Resp 20 | Ht 66.0 in | Wt 180.0 lb

## 2017-12-25 DIAGNOSIS — I10 Essential (primary) hypertension: Secondary | ICD-10-CM | POA: Diagnosis not present

## 2017-12-25 DIAGNOSIS — Z86010 Personal history of colonic polyps: Secondary | ICD-10-CM

## 2017-12-25 DIAGNOSIS — D120 Benign neoplasm of cecum: Secondary | ICD-10-CM

## 2017-12-25 DIAGNOSIS — K635 Polyp of colon: Secondary | ICD-10-CM | POA: Diagnosis not present

## 2017-12-25 DIAGNOSIS — D127 Benign neoplasm of rectosigmoid junction: Secondary | ICD-10-CM | POA: Diagnosis not present

## 2017-12-25 DIAGNOSIS — D122 Benign neoplasm of ascending colon: Secondary | ICD-10-CM

## 2017-12-25 DIAGNOSIS — Z1211 Encounter for screening for malignant neoplasm of colon: Secondary | ICD-10-CM

## 2017-12-25 DIAGNOSIS — E119 Type 2 diabetes mellitus without complications: Secondary | ICD-10-CM | POA: Diagnosis not present

## 2017-12-25 MED ORDER — SODIUM CHLORIDE 0.9 % IV SOLN: 500.0000 mL | Freq: Once | INTRAVENOUS | Status: DC

## 2017-12-25 NOTE — Progress Notes (Signed)
Pt's states no medical or surgical changes since previsit or office visit. 

## 2017-12-25 NOTE — Progress Notes (Signed)
Called to room to assist during endoscopic procedure.  Patient ID and intended procedure confirmed with present staff. Received instructions for my participation in the procedure from the performing physician.  

## 2017-12-25 NOTE — Progress Notes (Signed)
Report given to PACU, vss 

## 2017-12-25 NOTE — Patient Instructions (Signed)
Please call Dr Woodward Ku office, [phone] to schedule hemorrhoid banding.  YOU HAD AN ENDOSCOPIC PROCEDURE TODAY AT [facility]:   Refer to the procedure report that was given to you for any specific questions about what was found during the examination.  If the procedure report does not answer your questions, please call your gastroenterologist to clarify.  If you requested that your care partner not be given the details of your procedure findings, then the procedure report has been included in a sealed envelope for you to review at your convenience later.  YOU SHOULD EXPECT: Some feelings of bloating in the abdomen. Passage of more gas than usual.  Walking can help get rid of the air that was put into your GI tract during the procedure and reduce the bloating. If you had a lower endoscopy (such as a colonoscopy or flexible sigmoidoscopy) you may notice spotting of blood in your stool or on the toilet paper. If you underwent a bowel prep for your procedure, you may not have a normal bowel movement for a few days.  Please Note:  You might notice some irritation and congestion in your nose or some drainage.  This is from the oxygen used during your procedure.  There is no need for concern and it should clear up in a day or so.  SYMPTOMS TO REPORT IMMEDIATELY:   Following lower endoscopy (colonoscopy or flexible sigmoidoscopy):  Excessive amounts of blood in the stool  Significant tenderness or worsening of abdominal pains  Swelling of the abdomen that is new, acute  Fever of 100F or higher   For urgent or emergent issues, a gastroenterologist can be reached at any hour by calling [phone].   DIET:  We do recommend a small meal at first, but then you may proceed to your regular diet.  Drink plenty of fluids but you should avoid alcoholic beverages for 24 hours.  ACTIVITY:  You should plan to take it easy for the rest of today and you should NOT DRIVE or use heavy  machinery until tomorrow (because of the sedation medicines used during the test).    FOLLOW UP: Our staff will call the number listed on your records the next business day following your procedure to check on you and address any questions or concerns that you may have regarding the information given to you following your procedure. If we do not reach you, we will leave a message.  However, if you are feeling well and you are not experiencing any problems, there is no need to return our call.  We will assume that you have returned to your regular daily activities without incident.  If any biopsies were taken you will be contacted by phone or by letter within the next 1-3 weeks.  Please call us at [phone] if you have not heard about the biopsies in 3 weeks.    SIGNATURES/CONFIDENTIALITY: You and/or your care partner have signed paperwork which will be entered into your electronic medical record.  These signatures attest to the fact that that the information above on your After Visit Summary has been reviewed and is understood.  Full responsibility of the confidentiality of this discharge information lies with you and/or your care-partner.

## 2017-12-25 NOTE — Op Note (Addendum)
[place] Patient Name: Krista Johnson Procedure Date: 12/25/2017 8:29 AM MRN: [id number] Endoscopist: Mauri Pole , MD Age: 67 Referring MD:  Date of Birth: April 11, 1951 Gender: Female Account #: [id number] Procedure:                Colonoscopy Indications:              High risk colon cancer surveillance: Personal                            history of colonic polyps Medicines:                Monitored Anesthesia Care Procedure:                Pre-Anesthesia Assessment:                           - Prior to the procedure, a History and Physical                            was performed, and patient medications and                            allergies were reviewed. The patient's tolerance of                            previous anesthesia was also reviewed. The risks                            and benefits of the procedure and the sedation                            options and risks were discussed with the patient.                            All questions were answered, and informed consent                            was obtained. Prior Anticoagulants: The patient has                            taken no previous anticoagulant or antiplatelet                            agents. ASA Grade Assessment: II - A patient with                            mild systemic disease. After reviewing the risks                            and benefits, the patient was deemed in                            satisfactory condition to undergo the procedure.  After obtaining informed consent, the colonoscope                            was passed under direct vision. Throughout the                            procedure, the patient's blood pressure, pulse, and                            oxygen saturations were monitored continuously. The                            Colonoscope was introduced through the anus and                            advanced to the the cecum, identified  by                            appendiceal orifice and ileocecal valve. The                            colonoscopy was performed without difficulty. The                            patient tolerated the procedure well. The quality                            of the bowel preparation was adequate. The                            ileocecal valve, appendiceal orifice, and rectum                            were photographed. Scope In: 8:36:50 AM Scope Out: 8:58:29 AM Scope Withdrawal Time: 0 hours 16 minutes 17 seconds  Total Procedure Duration: 0 hours 21 minutes 39 seconds  Findings:                 The perianal and digital rectal examinations were                            normal.                           Two sessile polyps were found in the ascending                            colon and cecum. The polyps were 4 to 6 mm in size.                            These polyps were removed with a cold snare.                            Resection and retrieval were complete.  Two sessile polyps were found in the recto-sigmoid                            colon. The polyps were 1 to 2 mm in size. These                            polyps were removed with a cold biopsy forceps.                            Resection and retrieval were complete.                           Non-bleeding internal hemorrhoids were found during                            retroflexion. The hemorrhoids were large. Complications:            No immediate complications. Estimated Blood Loss:     Estimated blood loss was minimal. Impression:               - Two 4 to 6 mm polyps in the ascending colon and                            in the cecum, removed with a cold snare. Resected                            and retrieved.                           - Two 1 to 2 mm polyps at the recto-sigmoid colon,                            removed with a cold biopsy forceps. Resected and                            retrieved.                            - Non-bleeding internal hemorrhoids. Recommendation:           - Patient has a contact number available for                            emergencies. The signs and symptoms of potential                            delayed complications were discussed with the                            patient. Return to normal activities tomorrow.                            Written discharge instructions were provided to the  patient.                           - Resume previous diet.                           - Continue present medications.                           - Await pathology results.                           - Repeat colonoscopy in 3 - 5 years for                            surveillance based on pathology results.                           - For future colonoscopy the patient will require                            an extended preparation. If there are any                            questions, please contact the gastroenterologist.                           -Follow-up in office for hemorrhoidal band ligation                            as needed for symptomatic hemorrhoids, advised                            patient to call to schedule an appointment Mauri Pole, MD 12/25/2017 9:02:31 AM This report has been signed electronically.

## 2017-12-26 ENCOUNTER — Encounter: Payer: Self-pay | Admitting: Endocrinology

## 2017-12-26 ENCOUNTER — Encounter

## 2017-12-26 ENCOUNTER — Ambulatory Visit (INDEPENDENT_AMBULATORY_CARE_PROVIDER_SITE_OTHER): Payer: Medicare Other | Admitting: Endocrinology

## 2017-12-26 ENCOUNTER — Telehealth: Payer: Self-pay | Admitting: *Deleted

## 2017-12-26 ENCOUNTER — Encounter: Payer: Medicare Other | Attending: Endocrinology | Admitting: Nutrition

## 2017-12-26 DIAGNOSIS — Z713 Dietary counseling and surveillance: Secondary | ICD-10-CM | POA: Insufficient documentation

## 2017-12-26 DIAGNOSIS — E1122 Type 2 diabetes mellitus with diabetic chronic kidney disease: Secondary | ICD-10-CM | POA: Insufficient documentation

## 2017-12-26 DIAGNOSIS — N183 Chronic kidney disease, stage 3 (moderate): Secondary | ICD-10-CM

## 2017-12-26 DIAGNOSIS — Z794 Long term (current) use of insulin: Secondary | ICD-10-CM | POA: Insufficient documentation

## 2017-12-26 DIAGNOSIS — E119 Type 2 diabetes mellitus without complications: Secondary | ICD-10-CM

## 2017-12-26 DIAGNOSIS — E1165 Type 2 diabetes mellitus with hyperglycemia: Secondary | ICD-10-CM

## 2017-12-26 LAB — POCT GLYCOSYLATED HEMOGLOBIN (HGB A1C): Hemoglobin A1C: 14.9 % — AB (ref 4.0–5.6)

## 2017-12-26 MED ORDER — INSULIN NPH (HUMAN) (ISOPHANE) 100 UNIT/ML ~~LOC~~ SUSP: 30.0000 [IU] | SUBCUTANEOUS | 11 refills | Status: DC

## 2017-12-26 MED ORDER — LISINOPRIL-HYDROCHLOROTHIAZIDE 20-12.5 MG PO TABS: 1.0000 | ORAL_TABLET | Freq: Every day | ORAL | 11 refills | Status: DC

## 2017-12-26 NOTE — Patient Instructions (Addendum)
I have sent a prescription to your pharmacy, to reduce the HCTZ part of the lisinopril/HCTZ. Please come back for a follow-up appointment in 1 month. good diet and exercise significantly improve the control of your diabetes.  please let me know if you wish to be referred to a dietician.  high blood sugar is very risky to your health.  you should see an eye doctor and dentist every year.  It is very important to get all recommended vaccinations.  Controlling your blood pressure and cholesterol drastically reduces the damage diabetes does to your body.  Those who smoke should quit.  Please discuss these with your doctor.  check your blood sugar twice a day.  vary the time of day when you check, between before the 3 meals, and at bedtime.  also check if you have symptoms of your blood sugar being too high or too low.  please keep a record of the readings and bring it to your next appointment here (or you can bring the meter itself).  You can write it on any piece of paper.  please call us sooner if your blood sugar goes below 70, or if you have a lot of readings over 200. I have sent a prescription to your pharmacy, to change both insulins to "NPH," 30 units each morning.   On this type of insulin schedule, you should eat meals on a regular schedule.  If a meal is missed or significantly delayed, your blood sugar could go low. Please see Vaughan Basta, to go over checking your blood sugar, and drawing from the vial.

## 2017-12-26 NOTE — Progress Notes (Signed)
Patient was shown how to use the One Touch Verio IQ.  She tested her blood sugar and it was 42.  She reported feeling very weak and hungry.  She was given 4 ounces of juice.  Dr. Loanne Drilling notified.   She was instructed on how to use the vial and syringe, and she re demonstrated how to draw up the syringe with 30 units correctly, and had no final questions.  She questioned if she can continue using the [place] until gone.  After speaking with Dr. Loanne Drilling, she was told that she can take only the Tresiba, no Novolog, and written instructions were given for this.  She also re vebalized this correctly.

## 2017-12-26 NOTE — Patient Instructions (Signed)
Continue to take the [place] 30u daily until it is gone Stop all Novolog Once the [place] is gone, take 30u of NPH  With a syringe in the morning, once a day.

## 2017-12-26 NOTE — Telephone Encounter (Signed)
  Follow up Call-  Call back number 12/25/2017  Post procedure Call Back phone  # [phone]  Permission to leave phone message Yes  Some recent data might be hidden     Patient questions:  Do you have a fever, pain , or abdominal swelling? No. Pain Score  0 *  Have you tolerated food without any problems? Yes.    Have you been able to return to your normal activities? Yes.    Do you have any questions about your discharge instructions: Diet   No. Medications  No. Follow up visit  No.  Do you have questions or concerns about your Care? No.  Actions: * If pain score is 4 or above: No action needed, pain <4.

## 2017-12-26 NOTE — Progress Notes (Signed)
Subjective:    Patient ID: Krista Johnson, female    DOB: 1950/08/19, 67 y.o.   MRN: [id number]  HPI pt is referred by Dr Stephanie Acre, for diabetes.  Pt states DM was dx'ed in 2004; she has mild if any neuropathy of the lower extremities; she has associated renal insuff; she has been on insulin since last week; pt says her diet and exercise are good; she has never had GDM (G0), pancreatitis, pancreatic surgery, severe hypoglycemia or DKA.  She was rx'ed tresiba 10/d, and novolog 10 units 3 times a day (just before each meal).  However, she says she cannot afford insulin.  She does not check cbg's.  Last steroid injection was 5 mos ago. Past Medical History:  Diagnosis Date  . Allergy    as achild  . Diabetes mellitus without complication ([place])   . Hyperlipidemia   . Hypertension     Past Surgical History:  Procedure Laterality Date  . COLONOSCOPY      Social History   Socioeconomic History  . Marital status: Single    Spouse name: Not on file  . Number of children: Not on file  . Years of education: Not on file  . Highest education level: Not on file  Occupational History  . Not on file  Social Needs  . Financial resource strain: Not on file  . Food insecurity:    Worry: Not on file    Inability: Not on file  . Transportation needs:    Medical: Not on file    Non-medical: Not on file  Tobacco Use  . Smoking status: Never Smoker  . Smokeless tobacco: Never Used  Substance and Sexual Activity  . Alcohol use: Not Currently    Alcohol/week: 0.0 standard drinks    Frequency: Never    Comment: Only when she was younger, no drink in forty years  . Drug use: Never  . Sexual activity: Not on file  Lifestyle  . Physical activity:    Days per week: Not on file    Minutes per session: Not on file  . Stress: Not on file  Relationships  . Social connections:    Talks on phone: Not on file    Gets together: Not on file    Attends religious service: Not on file    Active  member of club or organization: Not on file    Attends meetings of clubs or organizations: Not on file    Relationship status: Not on file  . Intimate partner violence:    Fear of current or ex partner: Not on file    Emotionally abused: Not on file    Physically abused: Not on file    Forced sexual activity: Not on file  Other Topics Concern  . Not on file  Social History Narrative  . Not on file    Current Outpatient Medications on File Prior to Visit  Medication Sig Dispense Refill  . aspirin EC 81 MG tablet Take 81 mg by mouth daily.    . blood glucose meter kit and supplies KIT Dispense based on patient and insurance preference. Use up to four times daily as directed. (FOR ICD-9 250.00, 250.01). 1 each 0  . ketoconazole (NIZORAL) 2 % cream Apply to web space twice a day for 1 week then daily for 1 month 15 g 0  . ONETOUCH DELICA LANCETS 71G MISC 3 (three) times daily. as directed  5  . ONETOUCH VERIO test strip 3 (three) times  daily. as directed  5  . simvastatin (ZOCOR) 10 MG tablet Take 10 mg by mouth daily.     Marland Kitchen VITAMIN D, ERGOCALCIFEROL, PO Take 2,000 Units by mouth every 7 (seven) days.   0   No current facility-administered medications on file prior to visit.     Allergies  Allergen Reactions  . Sulfa Antibiotics Nausea Only  . Sulfamethoxazole Nausea Only    Family History  Problem Relation Age of Onset  . Diabetes Mother   . Hypertension Mother   . Diabetes Sister   . Hypertension Sister   . Diabetes Maternal Uncle   . Colon cancer Paternal Aunt   . Diabetes Paternal Aunt   . Stomach cancer Neg Hx   . Rectal cancer Neg Hx   . Esophageal cancer Neg Hx   . Colon polyps Neg Hx     BP 108/70 (BP Location: Left Arm, Patient Position: Sitting)   Ht _0  (1.676 m)   Wt 233 lb 6.4 oz (105.9 kg)   BMI 37.67 kg/m      Review of Systems denies blurry vision, headache, chest pain, sob, n/v, muscle cramps, excessive diaphoresis, memory loss, depression,  cold intolerance, rhinorrhea, and easy bruising.   She has regained a few lbs (she had lost 15 lbs over a few months).  She has frequent urination.      Objective:   Physical Exam VS: see vs page GEN: no distress HEAD: head: no deformity eyes: no periorbital swelling, no proptosis external nose and ears are normal mouth: no lesion seen NECK: supple, thyroid is not enlarged CHEST WALL: no deformity LUNGS: clear to auscultation CV: reg rate and rhythm, no murmur ABD: abdomen is soft, nontender.  no hepatosplenomegaly.  not distended.  no hernia MUSCULOSKELETAL: muscle bulk and strength are grossly normal.  no obvious joint swelling.  gait is normal and steady EXTEMITIES: no deformity.  no ulcer on the feet.  feet are of normal color and temp.  Trace bilat leg edema.  There is bilateral onychomycosis of the toenails.   PULSES: dorsalis pedis intact bilat.  no carotid bruit NEURO:  cn 2-12 grossly intact.   readily moves all 4's.  sensation is intact to touch on the feet SKIN:  Normal texture and temperature.  No rash or suspicious lesion is visible.   NODES:  None palpable at the neck PSYCH: alert, well-oriented.  Does not appear anxious nor depressed.  I have reviewed outside records, and summarized: Pt was noted to have elevated a1c, and referred here.  She takes zestoretic for HTN.  She was recently rx'ed high-dose ergocalciferol, for vit-D def.  After that course of rx, she takes 2000 units qd.  outside test results are reviewed: PTH=87 25-OH vit-D=12 Ca++=10.8 TSH=1.55 Creat=1.4  Lab Results  Component Value Date   HGBA1C 14.9 (A) 12/26/2017      Assessment & Plan:  Insulin-requiring type 2 DM, with renal insuff: severe exacerbation HTN: well-controlled.  She can tolerate reduction of HCTZ Hypercalcemia: could be due to HCTZ, but we'll reduce before stopping. 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  Patient Instructions  I have sent a prescription to your pharmacy, to reduce the HCTZ part of the lisinopril/HCTZ. Please come back for a follow-up appointment in 1 month. good diet and exercise significantly improve the control of your diabetes.  please let me know if you wish to be referred to a dietician.  high blood sugar is very risky to your health.  you should see an eye doctor and dentist every year.  It is very important to get all recommended vaccinations.  Controlling your blood pressure and cholesterol drastically reduces the damage diabetes does to your body.  Those who smoke should quit.  Please discuss these with your doctor.  check your blood sugar twice a day.  vary the time of day when you check, between before the 3 meals, and at bedtime.  also check if you have symptoms of your blood sugar being too high or too low.  please keep a record of the readings and bring it to your next appointment here (or you can bring the meter itself).  You can write it on any piece of paper.  please call us sooner if your blood sugar goes below 70, or if you have a lot of readings over 200. I have sent a prescription to your pharmacy, to change both insulins to "NPH," 30 units each morning.   On this type of insulin schedule, you should eat meals on a regular schedule.  If a meal is missed or significantly delayed, your blood sugar could go low. Please see Vaughan Basta, to go over checking your blood sugar, and drawing from the vial.

## 2017-12-27 ENCOUNTER — Telehealth: Payer: Self-pay | Admitting: Gynecologic Oncology

## 2017-12-27 DIAGNOSIS — D4959 Neoplasm of unspecified behavior of other genitourinary organ: Secondary | ICD-10-CM

## 2017-12-27 NOTE — Telephone Encounter (Signed)
Patient returned call.  States she is doing well.  States her blood sugar was 72 this am.  She is checking her blood sugar in the am and pm.  States she was unable to afford one of the diabetic medications prescribed for her but now she was told yesterday by her diabetic MD to only take [place] once daily. Informed patient of CT scan results. Advised her of the concern for distant metastatic disease on CT with the recommendation to proceed with a PET scan.  Patient states she does not want to deal with doctors this week and would like to wait.  Stressed the importance and briefly discussed that if distant disease was positive on PET, the plan for surgery upfront may change.  "I'll be alright."  Agreeable with proceeding with the PET scan after discussion.  Advised she would contacted with the date and time.  Verbalizing understanding.

## 2017-12-27 NOTE — Telephone Encounter (Signed)
Left message for patient asking her to please call the office. Plan to discuss CT scan and Dr. C-P's recommendation for PET scan.

## 2017-12-28 ENCOUNTER — Telehealth: Payer: Self-pay | Admitting: *Deleted

## 2017-12-28 NOTE — Telephone Encounter (Signed)
Spoke with the patient regarding her PET scan on September 24th. Gave the follow instructions, arrive at 9:30am for a 10am appt, nothing to eat/drink after midnight, and hold morning diabetic meds. Then gave the appt date/time of September 27th at 10:45am to see Dr. Denman George. Patient verbalized understanding

## 2018-01-01 ENCOUNTER — Encounter: Payer: Self-pay | Admitting: Gastroenterology

## 2018-01-04 ENCOUNTER — Telehealth: Payer: Self-pay | Admitting: Endocrinology

## 2018-01-04 ENCOUNTER — Telehealth: Payer: Self-pay

## 2018-01-04 MED ORDER — INSULIN ASPART PROT & ASPART (70-30 MIX) 100 UNIT/ML PEN: 25.0000 [IU] | PEN_INJECTOR | Freq: Every day | SUBCUTANEOUS | 11 refills | Status: DC

## 2018-01-04 MED ORDER — INSULIN NPH ISOPHANE & REGULAR (70-30) 100 UNIT/ML ~~LOC~~ SUSP: 25.0000 [IU] | Freq: Every day | SUBCUTANEOUS | 11 refills | Status: DC

## 2018-01-04 NOTE — Telephone Encounter (Signed)
Ok, we should change NPH to "70/30," 25 units each day with breakfast.  This insulin will work more during the day, and less overnight.  Please call or message [place] next week, to tell us how the blood sugar is doing.

## 2018-01-04 NOTE — Telephone Encounter (Signed)
Same price as NPH

## 2018-01-04 NOTE — Telephone Encounter (Signed)
Patient called today and stated when she wakes up in the morning her blood sugar is low now that she is taking tresiba 30 units at night-patient is feeling shaky, hungry and very nervous when she wakes up  but it goes up at night around 9 pm these are some of the readings from the last few days Morning readings 37 9/13 74 9/14 48 9/16  68 9/18   68 today  Night readings 197 9/13 207 9/14 187 9/15 224 9/18  Please advise if there needs to be a dosage change

## 2018-01-04 NOTE — Telephone Encounter (Signed)
Pt stated that she has to see if she can afford novolin 70/30 first

## 2018-01-04 NOTE — Telephone Encounter (Signed)
Please advise 

## 2018-01-04 NOTE — Telephone Encounter (Signed)
Patient requests a RX for Novolin Flex Pen 70/30 sent to [facility] on [address] ($8.58)  Patient wants to know if she should still take [place] at tonight and begin the Novalin Flex Pen 70/30 tomorrow morning? Please call patient at ph# [phone]

## 2018-01-04 NOTE — Telephone Encounter (Signed)
I have sent a prescription to your pharmacy If you are on tresiba, you should skip a day before starting novolog 70/30.

## 2018-01-05 MED ORDER — INSULIN ISOPHANE & REGULAR (HUMAN 70-30)100 UNIT/ML KWIKPEN: 25.0000 [IU] | PEN_INJECTOR | Freq: Every day | SUBCUTANEOUS | 11 refills | Status: DC

## 2018-01-05 NOTE — Telephone Encounter (Signed)
Pt prefers pens

## 2018-01-05 NOTE — Telephone Encounter (Signed)
Ok, I changed to novolin 70/30 pen, and resent.  I'll see you next time.

## 2018-01-05 NOTE — Telephone Encounter (Signed)
Pt stated it should be novolin 70/30

## 2018-01-05 NOTE — Telephone Encounter (Signed)
Does she prefer pen or vial?

## 2018-01-05 NOTE — Telephone Encounter (Signed)
Pt stated that had been on novolog previously and she would like to know what the difference is between novolog and novolin pens and to make sure novolin was sent

## 2018-01-08 ENCOUNTER — Telehealth: Payer: Self-pay | Admitting: Oncology

## 2018-01-08 NOTE — Telephone Encounter (Signed)
Krista Johnson called and wanted to clarify the instructions for the PET scan tomorrow.  Advised her to not eat or drink anything after midnight tonight and then to hold her morning dose of Novolin 70/30.  She verbalized understanding and agreement.

## 2018-01-09 ENCOUNTER — Encounter (HOSPITAL_COMMUNITY)
Admission: RE | Admit: 2018-01-09 | Discharge: 2018-01-09 | Disposition: A | Discharge status: Home / Self Care | Payer: Medicare Other | Source: Ambulatory Visit | Attending: Gynecologic Oncology | Admitting: Gynecologic Oncology

## 2018-01-09 ENCOUNTER — Ambulatory Visit: Payer: Medicare Other | Admitting: Gynecology

## 2018-01-09 DIAGNOSIS — D4959 Neoplasm of unspecified behavior of other genitourinary organ: Secondary | ICD-10-CM | POA: Diagnosis not present

## 2018-01-09 DIAGNOSIS — C541 Malignant neoplasm of endometrium: Secondary | ICD-10-CM | POA: Diagnosis not present

## 2018-01-09 LAB — GLUCOSE, CAPILLARY: Glucose-Capillary: 169 mg/dL — ABNORMAL HIGH (ref 70–99)

## 2018-01-09 MED ORDER — FLUDEOXYGLUCOSE F - 18 (FDG) INJECTION
9.1000 | Freq: Once | INTRAVENOUS | Status: AC | PRN
  Administered 2018-01-09: 9.1 via INTRAVENOUS

## 2018-01-11 ENCOUNTER — Telehealth: Payer: Self-pay | Admitting: Oncology

## 2018-01-11 NOTE — Telephone Encounter (Signed)
Krista Johnson called and asked what to expect from her appointment with Dr. Denman George tomorrow.  Advised her that it is to discuss her PET scan results and that Dr. Denman George may do a pelvic exam.  She may also order labs after the appointment.  [name] verbalized understanding and agreement.

## 2018-01-12 ENCOUNTER — Ambulatory Visit: Payer: Medicare Other | Admitting: Gynecologic Oncology

## 2018-01-12 ENCOUNTER — Encounter: Payer: Self-pay | Admitting: Gynecologic Oncology

## 2018-01-12 ENCOUNTER — Inpatient Hospital Stay: Payer: Medicare Other | Attending: Gynecology | Admitting: Gynecologic Oncology

## 2018-01-12 VITALS — BP 119/74 | HR 106 | Resp 20 | Wt 195.6 lb

## 2018-01-12 DIAGNOSIS — R590 Localized enlarged lymph nodes: Secondary | ICD-10-CM | POA: Diagnosis not present

## 2018-01-12 DIAGNOSIS — C541 Malignant neoplasm of endometrium: Secondary | ICD-10-CM | POA: Diagnosis not present

## 2018-01-12 NOTE — Patient Instructions (Addendum)
Preparing for your Surgery  Plan for surgery on January 23, 2018 with Dr. Everitt Amber at [place] will be scheduled for a robotic assisted total hysterectomy, bilateral salpingo-oophorectomy, sentinel lymph node biopsy.  We will also work on getting you an appointment to have the axillary lymph node biopsied but this will not interfere with surgery.  STOP TAKING ASPIRIN NOW.  Pre-operative Testing -You will receive a phone call from presurgical testing at [facility] to arrange for a pre-operative testing appointment before your surgery.  This appointment normally occurs one to two weeks before your scheduled surgery.   -Bring your insurance card, copy of an advanced directive if applicable, medication list  -At that visit, you will be asked to sign a consent for a possible blood transfusion in case a transfusion becomes necessary during surgery.  The need for a blood transfusion is rare but having consent is a necessary part of your care.     -You should not be taking blood thinners or aspirin at least ten days prior to surgery unless instructed by your surgeon.  Day Before Surgery at [place] will be asked to take in a light diet the day before surgery.  Avoid carbonated beverages.  You will be advised to have nothing to eat or drink after midnight the evening before.    Eat a light diet the day before surgery.  Examples including soups, broths, toast, yogurt, mashed potatoes.  Things to avoid include carbonated beverages (fizzy beverages), raw fruits and raw vegetables, or beans.   If your bowels are filled with gas, your surgeon will have difficulty visualizing your pelvic organs which increases your surgical risks.  Your role in recovery Your role is to become active as soon as directed by your doctor, while still giving yourself time to heal.  Rest when you feel tired. You will be asked to do the following in order to speed your recovery:  -  Cough and breathe deeply. This helps toclear and expand your lungs and can prevent pneumonia. You may be given a spirometer to practice deep breathing. A staff member will show you how to use the spirometer. - Do mild physical activity. Walking or moving your legs help your circulation and body functions return to normal. A staff member will help you when you try to walk and will provide you with simple exercises. Do not try to get up or walk alone the first time. - Actively manage your pain. Managing your pain lets you move in comfort. We will ask you to rate your pain on a scale of zero to 10. It is your responsibility to tell your doctor or nurse where and how much you hurt so your pain can be treated.  Special Considerations -If you are diabetic, you may be placed on insulin after surgery to have closer control over your blood sugars to promote healing and recovery.  This does not mean that you will be discharged on insulin.  If applicable, your oral antidiabetics will be resumed when you are tolerating a solid diet.  -Your final pathology results from surgery should be available by the Friday after surgery and the results will be relayed to you when available.  -Dr. Lahoma Crocker is the Surgeon that assists your GYN Oncologist with surgery.  The next day after your surgery you will either see your GYN Oncologist or Dr. Lahoma Crocker.   Blood Transfusion Information WHAT IS A BLOOD TRANSFUSION? A transfusion is the replacement  of blood or some of its parts. Blood is made up of multiple cells which provide different functions.  Red blood cells carry oxygen and are used for blood loss replacement.  White blood cells fight against infection.  Platelets control bleeding.  Plasma helps clot blood.  Other blood products are available for specialized needs, such as hemophilia or other clotting disorders. BEFORE THE TRANSFUSION  Who gives blood for transfusions?   You may be able to  donate blood to be used at a later date on yourself (autologous donation).  Relatives can be asked to donate blood. This is generally not any safer than if you have received blood from a stranger. The same precautions are taken to ensure safety when a relative's blood is donated.  Healthy volunteers who are fully evaluated to make sure their blood is safe. This is blood bank blood. Transfusion therapy is the safest it has ever been in the practice of medicine. Before blood is taken from a donor, a complete history is taken to make sure that person has no history of diseases nor engages in risky social behavior (examples are intravenous drug use or sexual activity with multiple partners). The donor's travel history is screened to minimize risk of transmitting infections, such as malaria. The donated blood is tested for signs of infectious diseases, such as HIV and hepatitis. The blood is then tested to be sure it is compatible with you in order to minimize the chance of a transfusion reaction. If you or a relative donates blood, this is often done in anticipation of surgery and is not appropriate for emergency situations. It takes many days to process the donated blood. RISKS AND COMPLICATIONS Although transfusion therapy is very safe and saves many lives, the main dangers of transfusion include:   Getting an infectious disease.  Developing a transfusion reaction. This is an allergic reaction to something in the blood you were given. Every precaution is taken to prevent this. The decision to have a blood transfusion has been considered carefully by your caregiver before blood is given. Blood is not given unless the benefits outweigh the risks.

## 2018-01-12 NOTE — H&P (View-Only) (Signed)
Follow-up Note: Gyn-Onc   Krista Johnson 67 y.o. female  Chief Complaint  Patient presents with  . Endometrial carcinoma (HCC)    High Grade    Assessment : High-grade endometrial carcinoma. Poorly controlled diabetes. Axillary lymphadenopathy on PET imaging.   Plan:  1/ endometrial cancer :  A detailed discussion was held with the patient with regard to to her endometrial cancer diagnosis. We discussed the standard management options for uterine cancer which includes surgery followed possibly by adjuvant therapy depending on the results of surgery. The options for surgical management include a hysterectomy and removal of the tubes and ovaries possibly with removal of pelvic and para-aortic lymph nodes.If feasible, a minimally invasive approach including a robotic hysterectomy or laparoscopic hysterectomy have benefits including shorter hospital stay, recovery time and better wound healing than with open surgery. The patient has been counseled about these surgical options and the risks of surgery in general including infection, bleeding, damage to surrounding structures (including bowel, bladder, ureters, nerves or vessels), and the postoperative risks of PE/ DVT, and lymphedema. I extensively reviewed the additional risks of robotic hysterectomy including possible need for conversion to open laparotomy.  I discussed positioning during surgery of trendelenberg and risks of minor facial swelling and care we take in preoperative positioning.  After counseling and consideration of her options, she desires to proceed with robotic assisted total hysterectomy with bilateral sapingo-oophorectomy and SLN biopsy. This is scheduled for early October.  She will likely need adjuvant therapy given the high grade nature of her cancer.  2/ Axillary lymphadenopathy: Possible mets from cancer, though this would be unusual for an endometrial cancer given the absence of other sites of disease (particularly  nodal) on imaging.  Recommend biopsy of the axilla (either would be appropriate) to rule out metastatic disease.  3/ poorly controlled diabetes. On a better regimen now and keeping blood glucose tightly controlled. I discussed how poorly controlled DM can result in perioperative complictions (particularly wound healing and infection).  HPI: 67 year old Afro-American female seen in consultation at the request of Dr. Lorriane Shire a good regarding management of a newly diagnosed high-grade endometrial carcinoma.  The patient noted some postmenopausal bleeding and was promptly seen by Dr. 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  Patient denies any other pelvic symptoms and denies any GI or GU problems.  Interval Hx: On preop labs she was noted to have extreme hyperglycemia, was admitted to hospital and was placed on an insulin regimen. She has done well since that time.  Preop imaging with a CT abd/pelvis showed a concerning area in the peripheral liver, T12 and axillary node enlargement.  This was followed by a PET on 01/09/18 which showed no abnormal liver enhancement and no T12 enhancement (suggesting that there was not metastatic disease at these sites), however there was PET avid and enlarged bilateral axillary nodes. There were no other concerning sites for metastatic disease, including no other enlarged lymph nodes.   Review of Systems:10 point review of systems is negative except as noted in interval history.   Vitals: Blood pressure 119/74, pulse (!) 106, resp. rate 20, weight 195 lb 9.6 oz (88.7 kg), SpO2 100 %.  Physical Exam: General : The patient is a healthy woman in no acute distress.  HEENT: normocephalic, extraoccular movements normal; neck is supple without thyromegally  Lynphnodes: Supraclavicular and inguinal nodes not enlarged  Abdomen: Soft, non-tender, no ascites, no organomegally, no  masses, no hernias  Pelvic:  EGBUS: Normal female  Vagina: Normal, no lesions  Urethra and Bladder: Normal, non-tender  Cervix: Normal no lesions are noted Uterus: Difficult to assess given the patient's habitus. Narrow vagina.  Bi-manual examination: Non-tender; no adenxal masses or nodularity    Lower extremities: No edema or varicosities. Normal range of motion      Allergies  Allergen Reactions  . Sulfa Antibiotics Nausea Only  . Sulfamethoxazole Nausea Only    Past Medical History:  Diagnosis Date  . Allergy    as achild  . Diabetes mellitus without complication (HCC)   . Hyperlipidemia   . Hypertension     Past Surgical History:  Procedure Laterality Date  . COLONOSCOPY      Current Outpatient Medications  Medication Sig Dispense Refill  . aspirin EC 81 MG tablet Take 81 mg by mouth daily.    . blood glucose meter kit and supplies KIT Dispense based on patient and insurance preference. Use up to four times daily as directed. (FOR ICD-9 250.00, 250.01). 1 each 0  . Insulin Isophane & Regular Human (NOVOLIN 70/30 FLEXPEN) (70-30) 100 UNIT/ML PEN Inject 25 Units into the skin daily with breakfast. and pen needles 1/day 15 mL 11  . ketoconazole (NIZORAL) 2 % cream Apply to web space twice a day for 1 week then daily for 1 month (Patient taking differently: Apply 1 application topically daily as needed for irritation. Apply to web space twice a day for 1 week then daily for 1 month) 15 g 0  . lisinopril-hydrochlorothiazide (PRINZIDE,ZESTORETIC) 20-12.5 MG tablet Take 1 tablet by mouth daily. 30 tablet 11  . ONETOUCH DELICA LANCETS 33G MISC 3 (three) times daily. as directed  5  . ONETOUCH VERIO test strip 3 (three) times daily. as directed  5  . simvastatin (ZOCOR) 10 MG tablet Take 10 mg by mouth at bedtime.     . Vitamin D, Ergocalciferol, 2000 units CAPS Take 2,000 Units by mouth daily.   0   No current facility-administered medications for this visit.     Social  History   Socioeconomic History  . Marital status: Single    Spouse name: Not on file  . Number of children: Not on file  . Years of education: Not on file  . Highest education level: Not on file  Occupational History  . Not on file  Social Needs  . Financial resource strain: Not on file  . Food insecurity:    Worry: Not on file    Inability: Not on file  . Transportation needs:    Medical: Not on file    Non-medical: Not on file  Tobacco Use  . Smoking status: Never Smoker  . Smokeless tobacco: Never Used  Substance and Sexual Activity  . Alcohol use: Not Currently    Alcohol/week: 0.0 standard drinks    Frequency: Never    Comment: Only when she was younger, no drink in forty years  . Drug use: Never  . Sexual activity: Not on file  Lifestyle  . Physical activity:    Days per week: Not on file    Minutes per session: Not on file  . Stress: Not on file  Relationships  . Social connections:    Talks on phone: Not on file    Gets together: Not on file    Attends religious service: Not on file    Active member of club or organization: Not on file    Attends meetings of clubs or organizations:   Not on file    Relationship status: Not on file  . Intimate partner violence:    Fear of current or ex partner: Not on file    Emotionally abused: Not on file    Physically abused: Not on file    Forced sexual activity: Not on file  Other Topics Concern  . Not on file  Social History Narrative  . Not on file    Family History  Problem Relation Age of Onset  . Diabetes Mother   . Hypertension Mother   . Diabetes Sister   . Hypertension Sister   . Diabetes Maternal Uncle   . Colon cancer Paternal Aunt   . Diabetes Paternal Aunt   . Stomach cancer Neg Hx   . Rectal cancer Neg Hx   . Esophageal cancer Neg Hx   . Colon polyps Neg Hx       Thereasa Solo, MD 01/12/2018, 5:47 PM

## 2018-01-12 NOTE — Progress Notes (Signed)
Follow-up Note: Gyn-Onc   Krista Johnson 67 y.o. female  Chief Complaint  Patient presents with  . Endometrial carcinoma (HCC)    High Grade    Assessment : High-grade endometrial carcinoma. Poorly controlled diabetes. Axillary lymphadenopathy on PET imaging.   Plan:  1/ endometrial cancer :  A detailed discussion was held with the patient with regard to to her endometrial cancer diagnosis. We discussed the standard management options for uterine cancer which includes surgery followed possibly by adjuvant therapy depending on the results of surgery. The options for surgical management include a hysterectomy and removal of the tubes and ovaries possibly with removal of pelvic and para-aortic lymph nodes.If feasible, a minimally invasive approach including a robotic hysterectomy or laparoscopic hysterectomy have benefits including shorter hospital stay, recovery time and better wound healing than with open surgery. The patient has been counseled about these surgical options and the risks of surgery in general including infection, bleeding, damage to surrounding structures (including bowel, bladder, ureters, nerves or vessels), and the postoperative risks of PE/ DVT, and lymphedema. I extensively reviewed the additional risks of robotic hysterectomy including possible need for conversion to open laparotomy.  I discussed positioning during surgery of trendelenberg and risks of minor facial swelling and care we take in preoperative positioning.  After counseling and consideration of her options, she desires to proceed with robotic assisted total hysterectomy with bilateral sapingo-oophorectomy and SLN biopsy. This is scheduled for early October.  She will likely need adjuvant therapy given the high grade nature of her cancer.  2/ Axillary lymphadenopathy: Possible mets from cancer, though this would be unusual for an endometrial cancer given the absence of other sites of disease (particularly  nodal) on imaging.  Recommend biopsy of the axilla (either would be appropriate) to rule out metastatic disease.  3/ poorly controlled diabetes. On a better regimen now and keeping blood glucose tightly controlled. I discussed how poorly controlled DM can result in perioperative complictions (particularly wound healing and infection).  HPI: 67 year old Afro-American female seen in consultation at the request of Dr. Lorriane Shire a good regarding management of a newly diagnosed high-grade endometrial carcinoma.  The patient noted some postmenopausal bleeding and was promptly seen by Dr. 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  Patient denies any other pelvic symptoms and denies any GI or GU problems.  Interval Hx: On preop labs she was noted to have extreme hyperglycemia, was admitted to hospital and was placed on an insulin regimen. She has done well since that time.  Preop imaging with a CT abd/pelvis showed a concerning area in the peripheral liver, T12 and axillary node enlargement.  This was followed by a PET on 01/09/18 which showed no abnormal liver enhancement and no T12 enhancement (suggesting that there was not metastatic disease at these sites), however there was PET avid and enlarged bilateral axillary nodes. There were no other concerning sites for metastatic disease, including no other enlarged lymph nodes.   Review of Systems:10 point review of systems is negative except as noted in interval history.   Vitals: Blood pressure 119/74, pulse (!) 106, resp. rate 20, weight 195 lb 9.6 oz (88.7 kg), SpO2 100 %.  Physical Exam: General : The patient is a healthy woman in no acute distress.  HEENT: normocephalic, extraoccular movements normal; neck is supple without thyromegally  Lynphnodes: Supraclavicular and inguinal nodes not enlarged  Abdomen: Soft, non-tender, no ascites, no organomegally, no  masses, no hernias  Pelvic:  EGBUS: Normal female  Vagina: Normal, no lesions  Urethra and Bladder: Normal, non-tender  Cervix: Normal no lesions are noted Uterus: Difficult to assess given the patient's habitus. Narrow vagina.  Bi-manual examination: Non-tender; no adenxal masses or nodularity    Lower extremities: No edema or varicosities. Normal range of motion      Allergies  Allergen Reactions  . Sulfa Antibiotics Nausea Only  . Sulfamethoxazole Nausea Only    Past Medical History:  Diagnosis Date  . Allergy    as achild  . Diabetes mellitus without complication (HCC)   . Hyperlipidemia   . Hypertension     Past Surgical History:  Procedure Laterality Date  . COLONOSCOPY      Current Outpatient Medications  Medication Sig Dispense Refill  . aspirin EC 81 MG tablet Take 81 mg by mouth daily.    . blood glucose meter kit and supplies KIT Dispense based on patient and insurance preference. Use up to four times daily as directed. (FOR ICD-9 250.00, 250.01). 1 each 0  . Insulin Isophane & Regular Human (NOVOLIN 70/30 FLEXPEN) (70-30) 100 UNIT/ML PEN Inject 25 Units into the skin daily with breakfast. and pen needles 1/day 15 mL 11  . ketoconazole (NIZORAL) 2 % cream Apply to web space twice a day for 1 week then daily for 1 month (Patient taking differently: Apply 1 application topically daily as needed for irritation. Apply to web space twice a day for 1 week then daily for 1 month) 15 g 0  . lisinopril-hydrochlorothiazide (PRINZIDE,ZESTORETIC) 20-12.5 MG tablet Take 1 tablet by mouth daily. 30 tablet 11  . ONETOUCH DELICA LANCETS 33G MISC 3 (three) times daily. as directed  5  . ONETOUCH VERIO test strip 3 (three) times daily. as directed  5  . simvastatin (ZOCOR) 10 MG tablet Take 10 mg by mouth at bedtime.     . Vitamin D, Ergocalciferol, 2000 units CAPS Take 2,000 Units by mouth daily.   0   No current facility-administered medications for this visit.     Social  History   Socioeconomic History  . Marital status: Single    Spouse name: Not on file  . Number of children: Not on file  . Years of education: Not on file  . Highest education level: Not on file  Occupational History  . Not on file  Social Needs  . Financial resource strain: Not on file  . Food insecurity:    Worry: Not on file    Inability: Not on file  . Transportation needs:    Medical: Not on file    Non-medical: Not on file  Tobacco Use  . Smoking status: Never Smoker  . Smokeless tobacco: Never Used  Substance and Sexual Activity  . Alcohol use: Not Currently    Alcohol/week: 0.0 standard drinks    Frequency: Never    Comment: Only when she was younger, no drink in forty years  . Drug use: Never  . Sexual activity: Not on file  Lifestyle  . Physical activity:    Days per week: Not on file    Minutes per session: Not on file  . Stress: Not on file  Relationships  . Social connections:    Talks on phone: Not on file    Gets together: Not on file    Attends religious service: Not on file    Active member of club or organization: Not on file    Attends meetings of clubs or organizations:   Not on file    Relationship status: Not on file  . Intimate partner violence:    Fear of current or ex partner: Not on file    Emotionally abused: Not on file    Physically abused: Not on file    Forced sexual activity: Not on file  Other Topics Concern  . Not on file  Social History Narrative  . Not on file    Family History  Problem Relation Age of Onset  . Diabetes Mother   . Hypertension Mother   . Diabetes Sister   . Hypertension Sister   . Diabetes Maternal Uncle   . Colon cancer Paternal Aunt   . Diabetes Paternal Aunt   . Stomach cancer Neg Hx   . Rectal cancer Neg Hx   . Esophageal cancer Neg Hx   . Colon polyps Neg Hx       Thereasa Solo, MD 01/12/2018, 5:47 PM

## 2018-01-15 ENCOUNTER — Ambulatory Visit: Payer: Medicare Other | Admitting: Endocrinology

## 2018-01-17 ENCOUNTER — Encounter: Payer: Self-pay | Admitting: Oncology

## 2018-01-17 ENCOUNTER — Encounter: Payer: Self-pay | Admitting: Podiatry

## 2018-01-17 ENCOUNTER — Ambulatory Visit (INDEPENDENT_AMBULATORY_CARE_PROVIDER_SITE_OTHER): Payer: Medicare Other | Admitting: Podiatry

## 2018-01-17 DIAGNOSIS — B351 Tinea unguium: Secondary | ICD-10-CM | POA: Diagnosis not present

## 2018-01-17 DIAGNOSIS — M79674 Pain in right toe(s): Secondary | ICD-10-CM | POA: Diagnosis not present

## 2018-01-17 DIAGNOSIS — M79675 Pain in left toe(s): Secondary | ICD-10-CM | POA: Diagnosis not present

## 2018-01-17 DIAGNOSIS — E1159 Type 2 diabetes mellitus with other circulatory complications: Secondary | ICD-10-CM | POA: Diagnosis not present

## 2018-01-17 DIAGNOSIS — R590 Localized enlarged lymph nodes: Secondary | ICD-10-CM

## 2018-01-17 NOTE — Progress Notes (Signed)
Complaint:  Visit Type: Patient returns to my office for continued preventative foot care services. Complaint: Patient states" my nails have grown long and thick and become painful to walk and wear shoes" Patient has been diagnosed with DM with no foot complications. The patient presents for preventative foot care services. No changes to ROS  Podiatric Exam: Vascular: dorsalis pedis and posterior tibial pulses are palpable bilateral. Capillary return is immediate. Temperature gradient is WNL. Skin turgor WNL  Sensorium: Normal Semmes Weinstein monofilament test. Normal tactile sensation bilaterally. Nail Exam: Pt has thick disfigured discolored nails with subungual debris noted bilateral entire nail hallux through fifth toenails Ulcer Exam: There is no evidence of ulcer or pre-ulcerative changes or infection. Orthopedic Exam: Muscle tone and strength are WNL. No limitations in general ROM. No crepitus or effusions noted. Foot type and digits show no abnormalities. Bony prominences are unremarkable. Skin: No Porokeratosis. No infection or ulcers  Diagnosis:  Onychomycosis, , Pain in right toe, pain in left toes  Treatment & Plan Procedures and Treatment: Consent by patient was obtained for treatment procedures.   Debridement of mycotic and hypertrophic toenails, 1 through 5 bilateral and clearing of subungual debris. No ulceration, no infection noted. Padding dispensed fourth toe right Return Visit-Office Procedure: Patient instructed to return to the office for a follow up visit 3 months for continued evaluation and treatment.    Gardiner Barefoot DPM

## 2018-01-17 NOTE — Patient Instructions (Addendum)
Krista Johnson  11-Dec-1950   Your procedure is scheduled on:  01-23-2018   Report to [facility] Main  Entrance,   Report to admitting at  9:00 AM    Call this number if you have problems the morning of surgery [phone]     Remember: Do not eat food or drink liquids :After Midnight. BRUSH YOUR TEETH MORNING OF SURGERY AND RINSE YOUR MOUTH OUT, NO CHEWING GUM CANDY OR MINTS.               Eat a light diet the day before surgery.  Examples including soups, broths, toast, yogurt, mashed potatoes.  Things to avoid include carbonated beverages (fizzy beverages), raw fruits and raw vegetables, or beans.              If your bowels are filled with gas, your surgeon will have difficulty visualizing your pelvic organs which increases your surgical risks.   Take these medicines the morning of surgery with A SIP OF WATER:   none                                You may not have any metal on your body including hair pins and              piercings               Do not wear jewelry, make-up, lotions, powders or perfumes, deodorant                         Do not wear nail polish.  Do not shave  48 hours prior to surgery.                 Do not bring valuables to the hospital. [place].  Contacts, dentures or bridgework may not be worn into surgery.  Leave suitcase in the car. After surgery it may be brought to your room.              Special Instructions: N/A              Please read over the following fact sheets you were given: _____________________________________________________________________   How to Manage Your Diabetes Before and After Surgery  Why is it important to control my blood sugar before and after surgery? . Improving blood sugar levels before and after surgery helps healing and can limit problems. . A way of improving blood sugar control is eating a healthy diet by: o  Eating less sugar and  carbohydrates o  Increasing activity/exercise o  Talking with your doctor about reaching your blood sugar goals . High blood sugars (greater than 180 mg/dL) can raise your risk of infections and slow your recovery, so you will need to focus on controlling your diabetes during the weeks before surgery. . Make sure that the doctor who takes care of your diabetes knows about your planned surgery including the date and location.  How do I manage my blood sugar before surgery? . Check your blood sugar at least 4 times a day, starting 2 days before surgery, to make sure that the level is not too high or low. o Check your blood sugar the morning  of your surgery when you wake up and every 2 hours until you get to the Short Stay unit. . If your blood sugar is less than 70 mg/dL, you will need to treat for low blood sugar: o Do not take insulin. o Treat a low blood sugar (less than 70 mg/dL) with  cup of clear juice (cranberry or apple), 4 glucose tablets, OR glucose gel. o Recheck blood sugar in 15 minutes after treatment (to make sure it is greater than 70 mg/dL). If your blood sugar is not greater than 70 mg/dL on recheck, call [phone] for further instructions. . Report your blood sugar to the short stay nurse when you get to Short Stay.  . If you are admitted to the hospital after surgery: o Your blood sugar will be checked by the staff and you will probably be given insulin after surgery (instead of oral diabetes medicines) to make sure you have good blood sugar levels. o The goal for blood sugar control after surgery is 80-180 mg/dL.   WHAT DO I DO ABOUT MY DIABETES MEDICATION?  Marland Kitchen Do not take oral diabetes medicines (pills) the morning of surgery.     . THE MORNING OF SURGERY,   Do not take any insulin   . If your CBG is greater than 220 mg/dL, you may take  of your sliding scale  . (correction) dose of insulin.    ______________________________________________________________           [facility] - Preparing for Surgery Before surgery, you can play an important role.  Because skin is not sterile, your skin needs to be as free of germs as possible.  You can reduce the number of germs on your skin by washing with CHG (chlorahexidine gluconate) soap before surgery.  CHG is an antiseptic cleaner which kills germs and bonds with the skin to continue killing germs even after washing. Please DO NOT use if you have an allergy to CHG or antibacterial soaps.  If your skin becomes reddened/irritated stop using the CHG and inform your nurse when you arrive at Short Stay. Do not shave (including legs and underarms) for at least 48 hours prior to the first CHG shower.  You may shave your face/neck. Please follow these instructions carefully:  1.  Shower with CHG Soap the night before surgery and the  morning of Surgery.  2.  If you choose to wash your hair, wash your hair first as usual with your  normal  shampoo.  3.  After you shampoo, rinse your hair and body thoroughly to remove the  shampoo.                                       4.  Use CHG as you would any other liquid soap.  You can apply chg directly  to the skin and wash                       Gently with a scrungie or clean washcloth.  5.  Apply the CHG Soap to your body ONLY FROM THE NECK DOWN.   Do not use on face/ open                           Wound or open sores. Avoid contact with eyes, ears mouth and genitals (private parts).  Wash face,  Genitals (private parts) with your normal soap.             6.  Wash thoroughly, paying special attention to the area where your surgery  will be performed.  7.  Thoroughly rinse your body with warm water from the neck down.  8.  DO NOT shower/wash with your normal soap after using and rinsing off  the CHG Soap.             9.  Pat yourself dry with a clean towel.            10.  Wear clean pajamas.            11.  Place clean sheets on your bed the night of your  first shower and do not  sleep with pets. Day of Surgery : Do not apply any lotions/deodorants the morning of surgery.  Please wear clean clothes to the hospital/surgery center.  FAILURE TO FOLLOW THESE INSTRUCTIONS MAY RESULT IN THE CANCELLATION OF YOUR SURGERY PATIENT SIGNATURE_________________________________  NURSE SIGNATURE__________________________________     ________________________________________________Incentive Spirometer  An incentive spirometer is a tool that can help keep your lungs clear and active. This tool measures how well you are filling your lungs with each breath. Taking long deep breaths may help reverse or decrease the chance of developing breathing (pulmonary) problems (especially infection) following:  A long period of time when you are unable to move or be active. BEFORE THE PROCEDURE   If the spirometer includes an indicator to show your best effort, your nurse or respiratory therapist will set it to a desired goal.  If possible, sit up straight or lean slightly forward. Try not to slouch.  Hold the incentive spirometer in an upright position. INSTRUCTIONS FOR USE  1. Sit on the edge of your bed if possible, or sit up as far as you can in bed or on a chair. 2. Hold the incentive spirometer in an upright position. 3. Breathe out normally. 4. Place the mouthpiece in your mouth and seal your lips tightly around it. 5. Breathe in slowly and as deeply as possible, raising the piston or the ball toward the top of the column. 6. Hold your breath for 3-5 seconds or for as long as possible. Allow the piston or ball to fall to the bottom of the column. 7. Remove the mouthpiece from your mouth and breathe out normally. 8. Rest for a few seconds and repeat Steps 1 through 7 at least 10 times every 1-2 hours when you are awake. Take your time and take a few normal breaths between deep breaths. 9. The spirometer may include an indicator to show your best effort. Use  the indicator as a goal to work toward during each repetition. 10. After each set of 10 deep breaths, practice coughing to be sure your lungs are clear. If you have an incision (the cut made at the time of surgery), support your incision when coughing by placing a pillow or rolled up towels firmly against it. Once you are able to get out of bed, walk around indoors and cough well. You may stop using the incentive spirometer when instructed by your caregiver.  RISKS AND COMPLICATIONS  Take your time so you do not get dizzy or light-headed.  If you are in pain, you may need to take or ask for pain medication before doing incentive spirometry. It is harder to take a deep breath if you are having pain. AFTER USE  Rest and breathe slowly and easily.  It can be helpful to keep track of a log of your progress. Your caregiver can provide you with a simple table to help with this. If you are using the spirometer at home, follow these instructions: [place] IF:   You are having difficultly using the spirometer.  You have trouble using the spirometer as often as instructed.  Your pain medication is not giving enough relief while using the spirometer.  You develop fever of 100.5 F (38.1 C) or higher. SEEK IMMEDIATE MEDICAL CARE IF:   You cough up bloody sputum that had not been present before.  You develop fever of 102 F (38.9 C) or greater.  You develop worsening pain at or near the incision site. MAKE SURE YOU:   Understand these instructions.  Will watch your condition.  Will get help right away if you are not doing well or get worse. Document Released: 08/15/2006 Document Revised: 06/27/2011 Document Reviewed: 10/16/2006 ExitCare Patient Information 2014 ExitCare, [place].     ___________________________________WHAT IS A BLOOD TRANSFUSION? Blood Transfusion Information  A transfusion is the replacement of blood or some of its parts. Blood is made up of multiple cells  which provide different functions.  Red blood cells carry oxygen and are used for blood loss replacement.  White blood cells fight against infection.  Platelets control bleeding.  Plasma helps clot blood.  Other blood products are available for specialized needs, such as hemophilia or other clotting disorders. BEFORE THE TRANSFUSION  Who gives blood for transfusions?   Healthy volunteers who are fully evaluated to make sure their blood is safe. This is blood bank blood. Transfusion therapy is the safest it has ever been in the practice of medicine. Before blood is taken from a donor, a complete history is taken to make sure that person has no history of diseases nor engages in risky social behavior (examples are intravenous drug use or sexual activity with multiple partners). The donor's travel history is screened to minimize risk of transmitting infections, such as malaria. The donated blood is tested for signs of infectious diseases, such as HIV and hepatitis. The blood is then tested to be sure it is compatible with you in order to minimize the chance of a transfusion reaction. If you or a relative donates blood, this is often done in anticipation of surgery and is not appropriate for emergency situations. It takes many days to process the donated blood. RISKS AND COMPLICATIONS Although transfusion therapy is very safe and saves many lives, the main dangers of transfusion include:   Getting an infectious disease.  Developing a transfusion reaction. This is an allergic reaction to something in the blood you were given. Every precaution is taken to prevent this. The decision to have a blood transfusion has been considered carefully by your caregiver before blood is given. Blood is not given unless the benefits outweigh the risks. AFTER THE TRANSFUSION  Right after receiving a blood transfusion, you will usually feel much better and more energetic. This is especially true if your red blood  cells have gotten low (anemic). The transfusion raises the level of the red blood cells which carry oxygen, and this usually causes an energy increase.  The nurse administering the transfusion will monitor you carefully for complications. HOME CARE INSTRUCTIONS  No special instructions are needed after a transfusion. You may find your energy is better. Speak with your caregiver about any limitations on activity for underlying diseases you may have.  SEEK MEDICAL CARE IF:   Your condition is not improving after your transfusion.  You develop redness or irritation at the intravenous (IV) site. SEEK IMMEDIATE MEDICAL CARE IF:  Any of the following symptoms occur over the next 12 hours:  Shaking chills.  You have a temperature by mouth above 102 F (38.9 C), not controlled by medicine.  Chest, back, or muscle pain.  People around you feel you are not acting correctly or are confused.  Shortness of breath or difficulty breathing.  Dizziness and fainting.  You get a rash or develop hives.  You have a decrease in urine output.  Your urine turns a dark color or changes to pink, red, or brown. Any of the following symptoms occur over the next 10 days:  You have a temperature by mouth above 102 F (38.9 C), not controlled by medicine.  Shortness of breath.  Weakness after normal activity.  The white part of the eye turns yellow (jaundice).  You have a decrease in the amount of urine or are urinating less often.  Your urine turns a dark color or changes to pink, red, or brown. Document Released: 04/01/2000 Document Revised: 06/27/2011 Document Reviewed: 11/19/2007 [facility] Patient Information 2014 [place], [place].  _______________________________________________________________________

## 2018-01-18 ENCOUNTER — Encounter (HOSPITAL_COMMUNITY): Payer: Self-pay

## 2018-01-18 ENCOUNTER — Other Ambulatory Visit: Payer: Self-pay

## 2018-01-18 ENCOUNTER — Encounter (HOSPITAL_COMMUNITY)
Admission: RE | Admit: 2018-01-18 | Discharge: 2018-01-18 | Disposition: A | Discharge status: Home / Self Care | Payer: Medicare Other | Source: Ambulatory Visit | Attending: Gynecologic Oncology | Admitting: Gynecologic Oncology

## 2018-01-18 DIAGNOSIS — C541 Malignant neoplasm of endometrium: Secondary | ICD-10-CM | POA: Insufficient documentation

## 2018-01-18 DIAGNOSIS — E785 Hyperlipidemia, unspecified: Secondary | ICD-10-CM | POA: Diagnosis not present

## 2018-01-18 DIAGNOSIS — Z79899 Other long term (current) drug therapy: Secondary | ICD-10-CM | POA: Insufficient documentation

## 2018-01-18 DIAGNOSIS — Z01818 Encounter for other preprocedural examination: Secondary | ICD-10-CM | POA: Diagnosis not present

## 2018-01-18 DIAGNOSIS — I10 Essential (primary) hypertension: Secondary | ICD-10-CM | POA: Insufficient documentation

## 2018-01-18 DIAGNOSIS — Z7982 Long term (current) use of aspirin: Secondary | ICD-10-CM | POA: Insufficient documentation

## 2018-01-18 DIAGNOSIS — E119 Type 2 diabetes mellitus without complications: Secondary | ICD-10-CM | POA: Insufficient documentation

## 2018-01-18 DIAGNOSIS — Z794 Long term (current) use of insulin: Secondary | ICD-10-CM | POA: Diagnosis not present

## 2018-01-18 HISTORY — DX: Chronic kidney disease, unspecified: N18.9

## 2018-01-18 HISTORY — DX: Personal history of colon polyps, unspecified: Z86.0100

## 2018-01-18 HISTORY — DX: Personal history of colonic polyps: Z86.010

## 2018-01-18 HISTORY — DX: Unspecified osteoarthritis, unspecified site: M19.90

## 2018-01-18 HISTORY — DX: Hyperparathyroidism, unspecified: E21.3

## 2018-01-18 HISTORY — DX: Malignant neoplasm of endometrium: C54.1

## 2018-01-18 LAB — CBC
HCT: 34.4 % — ABNORMAL LOW (ref 36.0–46.0)
Hemoglobin: 10.9 g/dL — ABNORMAL LOW (ref 12.0–15.0)
MCH: 27.6 pg (ref 26.0–34.0)
MCHC: 31.7 g/dL (ref 30.0–36.0)
MCV: 87.1 fL (ref 78.0–100.0)
Platelets: 329 10*3/uL (ref 150–400)
RBC: 3.95 MIL/uL (ref 3.87–5.11)
RDW: 14.1 % (ref 11.5–15.5)
WBC: 4.1 10*3/uL (ref 4.0–10.5)

## 2018-01-18 LAB — COMPREHENSIVE METABOLIC PANEL
ALT: 15 U/L (ref 0–44)
AST: 18 U/L (ref 15–41)
Albumin: 3.6 g/dL (ref 3.5–5.0)
Alkaline Phosphatase: 59 U/L (ref 38–126)
Anion gap: 10 (ref 5–15)
BUN: 21 mg/dL (ref 8–23)
CO2: 23 mmol/L (ref 22–32)
Calcium: 10 mg/dL (ref 8.9–10.3)
Chloride: 110 mmol/L (ref 98–111)
Creatinine, Ser: 1.41 mg/dL — ABNORMAL HIGH (ref 0.44–1.00)
GFR calc Af Amer: 44 mL/min — ABNORMAL LOW (ref >60)
GFR calc non Af Amer: 38 mL/min — ABNORMAL LOW (ref >60)
Glucose, Bld: 199 mg/dL — ABNORMAL HIGH (ref 70–99)
Potassium: 4.2 mmol/L (ref 3.5–5.1)
Sodium: 143 mmol/L (ref 135–145)
Total Bilirubin: 0.5 mg/dL (ref 0.3–1.2)
Total Protein: 6.5 g/dL (ref 6.5–8.1)

## 2018-01-18 LAB — URINALYSIS, ROUTINE W REFLEX MICROSCOPIC
Bilirubin Urine: NEGATIVE
Glucose, UA: >=500 mg/dL — AB
Hgb urine dipstick: NEGATIVE
Ketones, ur: NEGATIVE mg/dL
Leukocytes, UA: NEGATIVE
Nitrite: NEGATIVE
Protein, ur: NEGATIVE mg/dL
Specific Gravity, Urine: 1.016 (ref 1.005–1.030)
pH: 6 (ref 5.0–8.0)

## 2018-01-18 LAB — ABO/RH: ABO/RH(D): AB POS

## 2018-01-18 LAB — GLUCOSE, CAPILLARY: Glucose-Capillary: 209 mg/dL — ABNORMAL HIGH (ref 70–99)

## 2018-01-18 NOTE — Progress Notes (Addendum)
Routed CMP and UA results dated 01-18-2018 to dr Denman George in epic.  ADDENDUM:  A1c result dated 01-18-2018 sent to dr Denman George in epic.  Pt surgery case time change today from 1100 to 0730.  Called and lvm via pt cell phone to arrive at 0530 day of surgery 01-23-2018.  ADDENDUM:  01-22-2018 Received call back via phone from pt confirming she received message to arrive at [facility].

## 2018-01-19 LAB — HEMOGLOBIN A1C
Hgb A1c MFr Bld: 11.5 % — ABNORMAL HIGH (ref 4.8–5.6)
Mean Plasma Glucose: 283 mg/dL

## 2018-01-22 ENCOUNTER — Encounter: Payer: Self-pay | Admitting: Endocrinology

## 2018-01-22 ENCOUNTER — Ambulatory Visit (INDEPENDENT_AMBULATORY_CARE_PROVIDER_SITE_OTHER): Payer: Medicare Other | Admitting: Endocrinology

## 2018-01-22 DIAGNOSIS — Z794 Long term (current) use of insulin: Secondary | ICD-10-CM

## 2018-01-22 DIAGNOSIS — E1122 Type 2 diabetes mellitus with diabetic chronic kidney disease: Secondary | ICD-10-CM

## 2018-01-22 DIAGNOSIS — N183 Chronic kidney disease, stage 3 unspecified: Secondary | ICD-10-CM

## 2018-01-22 LAB — BASIC METABOLIC PANEL
BUN: 21 mg/dL (ref 6–23)
CO2: 27 mEq/L (ref 19–32)
Calcium: 10.4 mg/dL (ref 8.4–10.5)
Chloride: 105 mEq/L (ref 96–112)
Creatinine, Ser: 1.17 mg/dL (ref 0.40–1.20)
GFR: 59.31 mL/min — ABNORMAL LOW (ref >60.00)
Glucose, Bld: 161 mg/dL — ABNORMAL HIGH (ref 70–99)
Potassium: 4.2 mEq/L (ref 3.5–5.1)
Sodium: 139 mEq/L (ref 135–145)

## 2018-01-22 LAB — VITAMIN D 25 HYDROXY (VIT D DEFICIENCY, FRACTURES): VITD: 30.54 ng/mL (ref 30.00–100.00)

## 2018-01-22 NOTE — Progress Notes (Signed)
Subjective:    Patient ID: Krista Johnson, female    DOB: [birth year], 67 y.o.   MRN: [id number]  HPI Pt returns for f/u of diabetes mellitus: DM type: Insulin-requiring type 2.   Dx'ed: 2202 Complications: renal insuff Therapy: insulin since mid-2019 GDM: never (G0) DKA: never Severe hypoglycemia: never Pancreatitis: never Pancreatic imaging: normal on 2019 CT.   Other: she takes qd insulin, after poor results with multiple daily injections. Interval history: She will have TAH tomorrow AM.  Meter is downloaded today, and the printout is scanned into the record.  cbg varies from [place].  It is in general higher as the day goes on.  pt states she feels well in general.  Pt also has vit-D def and Hyperparathyroidism (poss primary and secondary). Past Medical History:  Diagnosis Date  . Arthritis    right knee--- last cortisone injection 04/ 2019  . CKD (chronic kidney disease)   . Diabetes mellitus without complication [facility])    endocrinologist-  dr Loanne Drilling  . Endometrial cancer ([place])   . History of colon polyps   . Hyperlipidemia   . Hyperparathyroidism ([place])   . Hypertension     Past Surgical History:  Procedure Laterality Date  . COLONOSCOPY  last one 12-25-2017    Social History   Socioeconomic History  . Marital status: Single    Spouse name: Not on file  . Number of children: Not on file  . Years of education: Not on file  . Highest education level: Not on file  Occupational History  . Not on file  Social Needs  . Financial resource strain: Not on file  . Food insecurity:    Worry: Not on file    Inability: Not on file  . Transportation needs:    Medical: Not on file    Non-medical: Not on file  Tobacco Use  . Smoking status: Never Smoker  . Smokeless tobacco: Never Used  Substance and Sexual Activity  . Alcohol use: Not Currently    Alcohol/week: 0.0 standard drinks    Frequency: Never  . Drug use: Never  . Sexual activity: Not on file  Lifestyle    . Physical activity:    Days per week: Not on file    Minutes per session: Not on file  . Stress: Not on file  Relationships  . Social connections:    Talks on phone: Not on file    Gets together: Not on file    Attends religious service: Not on file    Active member of club or organization: Not on file    Attends meetings of clubs or organizations: Not on file    Relationship status: Not on file  . Intimate partner violence:    Fear of current or ex partner: Not on file    Emotionally abused: Not on file    Physically abused: Not on file    Forced sexual activity: Not on file  Other Topics Concern  . Not on file  Social History Narrative  . Not on file    Current Facility-Administered Medications on File Prior to Visit  Medication Dose Route Frequency Provider Last Rate Last Dose  . acetaminophen (TYLENOL) tablet 1,000 mg  1,000 mg Oral Q6H Precious Haws B, MD   1,000 mg at 01/23/18 1759  . dextrose 5 % and 0.45 % NaCl with KCl 20 mEq/L infusion   Intravenous Continuous Precious Haws B, MD 50 mL/hr at 01/23/18 1206    . [START  ON 01/24/2018] enoxaparin (LOVENOX) injection 40 mg  40 mg Subcutaneous Q24H Precious Haws B, MD      . gabapentin (NEURONTIN) capsule 300 mg  300 mg Oral BID Precious Haws B, MD   300 mg at 01/23/18 1207  . lisinopril (PRINIVIL,ZESTRIL) tablet 20 mg  20 mg Oral Daily Everitt Amber, MD   20 mg at 01/23/18 1207   And  . hydrochlorothiazide (MICROZIDE) capsule 12.5 mg  12.5 mg Oral Daily Everitt Amber, MD   12.5 mg at 01/23/18 1207  . HYDROmorphone (DILAUDID) injection 0.5 mg  0.5 mg Intravenous Q2H PRN Precious Haws B, MD      . insulin aspart (novoLOG) injection 0-15 Units  0-15 Units Subcutaneous Q4H Precious Haws B, MD   8 Units at 01/23/18 1651  . insulin aspart protamine- aspart (NOVOLOG MIX 70/30) injection 15 Units  15 Units Subcutaneous BID WC Precious Haws B, MD   15 Units at 01/23/18 1753  . ketorolac (TORADOL) 15 MG/ML injection 15 mg  15 mg  Intravenous Q6H PRN Precious Haws B, MD   15 mg at 01/23/18 1100  . ketorolac (TORADOL) 15 MG/ML injection           . ondansetron (ZOFRAN) tablet 4 mg  4 mg Oral Q6H PRN Precious Haws B, MD       Or  . ondansetron [facility]) injection 4 mg  4 mg Intravenous Q6H PRN Precious Haws B, MD      . oxyCODONE (Oxy IR/ROXICODONE) immediate release tablet 5-10 mg  5-10 mg Oral Q4H PRN Precious Haws B, MD      . scopolamine (TRANSDERM-SCOP) 1 MG/3DAYS 1.5 mg  1 patch Transdermal On Call to OR [name] D, NP   1.5 mg at 01/23/18 0637  . simvastatin (ZOCOR) tablet 10 mg  10 mg Oral QHS [name] Caprice, MD       Current Outpatient Medications on File Prior to Visit  Medication Sig Dispense Refill  . aspirin EC 81 MG tablet Take 81 mg by mouth daily.    . blood glucose meter kit and supplies KIT Dispense based on patient and insurance preference. Use up to four times daily as directed. (FOR ICD-9 250.00, 250.01). 1 each 0  . Insulin Isophane & Regular Human (NOVOLIN 70/30 FLEXPEN) (70-30) 100 UNIT/ML PEN Inject 25 Units into the skin daily with breakfast. and pen needles 1/day 15 mL 11  . ketoconazole (NIZORAL) 2 % cream Apply to web space twice a day for 1 week then daily for 1 month (Patient taking differently: Apply 1 application topically daily as needed for irritation. Apply to web space twice a day for 1 week then daily for 1 month) 15 g 0  . lisinopril-hydrochlorothiazide (PRINZIDE,ZESTORETIC) 20-12.5 MG tablet Take 1 tablet by mouth daily. (Patient taking differently: Take 1 tablet by mouth every morning. ) 30 tablet 11  . ONETOUCH DELICA LANCETS 78G MISC 3 (three) times daily. as directed  5  . ONETOUCH VERIO test strip 3 (three) times daily. as directed  5  . simvastatin (ZOCOR) 10 MG tablet Take 10 mg by mouth at bedtime.     . Vitamin D, Ergocalciferol, 2000 units CAPS Take 2,000 Units by mouth daily.   0    Allergies  Allergen Reactions  . Sulfa Antibiotics Nausea Only  .  Sulfamethoxazole Nausea Only    Family History  Problem Relation Age of Onset  . Diabetes Mother   . Hypertension Mother   . Diabetes Sister   .  Hypertension Sister   . Diabetes Maternal Uncle   . Colon cancer Paternal Aunt   . Diabetes Paternal Aunt   . Stomach cancer Neg Hx   . Rectal cancer Neg Hx   . Esophageal cancer Neg Hx   . Colon polyps Neg Hx     BP 126/86 (BP Location: Left Arm, Patient Position: Sitting)   Pulse 99   Ht _0  (1.676 m)   Wt 190 lb 12.8 oz (86.5 kg)   SpO2 99%   BMI 30.80 kg/m    Review of Systems Denies LOC.      Objective:   Physical Exam VITAL SIGNS:  See vs page GENERAL: no distress Pulses: foot pulses are intact bilaterally.   MSK: no deformity of the feet or ankles.  CV: trace bilat edema of the legs.   Skin:  no ulcer on the feet or ankles.  normal color and temp on the feet and ankles.   Neuro: sensation is intact to touch on the feet and ankles.    Lab Results  Component Value Date   HGBA1C 11.5 (H) 01/18/2018       Assessment & Plan:  Insulin-requiring type 2 DM: improved glycemic control.   Hyperparathyroidism: recheck today.  Renal insuff: in this context, she may need a faster-acting qd insulin, but we'll continue the same for now.   Preoperative care: she should skip insulin tomorrow AM.   Patient Instructions  Please come back for a follow-up appointment in 1 month.  check your blood sugar twice a day.  vary the time of day when you check, between before the 3 meals, and at bedtime.  also check if you have symptoms of your blood sugar being too high or too low.  please keep a record of the readings and bring it to your next appointment here (or you can bring the meter itself).  You can write it on any piece of paper.  please call us sooner if your blood sugar goes below 70, or if you have a lot of readings over 200. Please skip the insulin tomorrow morning On this type of insulin schedule, you should eat meals on a  regular schedule.  If a meal is missed or significantly delayed, your blood sugar could go low. blood tests are requested for you today.  We'll let you know about the results.

## 2018-01-22 NOTE — Patient Instructions (Addendum)
Please come back for a follow-up appointment in 1 month.  check your blood sugar twice a day.  vary the time of day when you check, between before the 3 meals, and at bedtime.  also check if you have symptoms of your blood sugar being too high or too low.  please keep a record of the readings and bring it to your next appointment here (or you can bring the meter itself).  You can write it on any piece of paper.  please call us sooner if your blood sugar goes below 70, or if you have a lot of readings over 200. Please skip the insulin tomorrow morning On this type of insulin schedule, you should eat meals on a regular schedule.  If a meal is missed or significantly delayed, your blood sugar could go low. blood tests are requested for you today.  We'll let you know about the results.

## 2018-01-22 NOTE — Anesthesia Preprocedure Evaluation (Addendum)
Anesthesia Evaluation  Patient identified by MRN, date of birth, ID band Patient awake    Reviewed: Allergy & Precautions, NPO status , Patient's Chart, lab work & pertinent test results  History of Anesthesia Complications Negative for: history of anesthetic complications  Airway Mallampati: I  TM Distance: >3 FB Neck ROM: Full    Dental no notable dental hx.    Pulmonary neg pulmonary ROS,    Pulmonary exam normal        Cardiovascular hypertension, Pt. 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), Poorly Controlled, Type 2, Insulin Dependent  Renal/GU Renal InsufficiencyRenal disease (Cr 1.41)  negative genitourinary   Musculoskeletal  (+) Arthritis , Osteoarthritis,    Abdominal (+) + obese,   Peds  Hematology negative hematology ROS (+)   Anesthesia Other Findings   Reproductive/Obstetrics                            Anesthesia Physical Anesthesia Plan  ASA: III  Anesthesia Plan: General   Post-op Pain Management:    Induction: Intravenous  PONV Risk Score and Plan: 4 or greater and Ondansetron, Dexamethasone, Treatment may vary due to age or medical condition and Scopolamine patch - Pre-op  Airway Management Planned: Oral ETT  Additional Equipment: None  Intra-op Plan:   Post-operative Plan: Extubation in OR  Informed Consent: I have reviewed the patients History and Physical, chart, labs and discussed the procedure including the risks, benefits and alternatives for the proposed anesthesia with the patient or authorized representative who has indicated his/her understanding and acceptance.     Plan Discussed with:   Anesthesia Plan Comments:        Anesthesia Quick Evaluation

## 2018-01-23 ENCOUNTER — Ambulatory Visit (HOSPITAL_COMMUNITY): Payer: Medicare Other | Admitting: Anesthesiology

## 2018-01-23 ENCOUNTER — Ambulatory Visit (HOSPITAL_COMMUNITY)
Admission: RE | Admit: 2018-01-23 | Discharge: 2018-01-24 | Disposition: A | Discharge status: Home / Self Care | Payer: Medicare Other | Source: Ambulatory Visit | Attending: Gynecologic Oncology | Admitting: Gynecologic Oncology

## 2018-01-23 ENCOUNTER — Encounter (HOSPITAL_COMMUNITY)
Admission: RE | Disposition: A | Discharge status: Home / Self Care | Payer: Self-pay | Source: Ambulatory Visit | Attending: Gynecologic Oncology

## 2018-01-23 ENCOUNTER — Other Ambulatory Visit: Payer: Self-pay

## 2018-01-23 ENCOUNTER — Encounter (HOSPITAL_COMMUNITY): Payer: Self-pay | Admitting: *Deleted

## 2018-01-23 DIAGNOSIS — E119 Type 2 diabetes mellitus without complications: Secondary | ICD-10-CM

## 2018-01-23 DIAGNOSIS — C7982 Secondary malignant neoplasm of genital organs: Secondary | ICD-10-CM | POA: Diagnosis not present

## 2018-01-23 DIAGNOSIS — C7962 Secondary malignant neoplasm of left ovary: Secondary | ICD-10-CM | POA: Diagnosis not present

## 2018-01-23 DIAGNOSIS — N183 Chronic kidney disease, stage 3 unspecified: Secondary | ICD-10-CM

## 2018-01-23 DIAGNOSIS — Z7982 Long term (current) use of aspirin: Secondary | ICD-10-CM | POA: Insufficient documentation

## 2018-01-23 DIAGNOSIS — C541 Malignant neoplasm of endometrium: Secondary | ICD-10-CM

## 2018-01-23 DIAGNOSIS — E1165 Type 2 diabetes mellitus with hyperglycemia: Secondary | ICD-10-CM | POA: Diagnosis not present

## 2018-01-23 DIAGNOSIS — D259 Leiomyoma of uterus, unspecified: Secondary | ICD-10-CM | POA: Insufficient documentation

## 2018-01-23 DIAGNOSIS — C542 Malignant neoplasm of myometrium: Secondary | ICD-10-CM | POA: Diagnosis not present

## 2018-01-23 DIAGNOSIS — C786 Secondary malignant neoplasm of retroperitoneum and peritoneum: Secondary | ICD-10-CM | POA: Diagnosis not present

## 2018-01-23 DIAGNOSIS — I10 Essential (primary) hypertension: Secondary | ICD-10-CM | POA: Insufficient documentation

## 2018-01-23 DIAGNOSIS — R590 Localized enlarged lymph nodes: Secondary | ICD-10-CM | POA: Insufficient documentation

## 2018-01-23 DIAGNOSIS — Z794 Long term (current) use of insulin: Secondary | ICD-10-CM | POA: Diagnosis not present

## 2018-01-23 DIAGNOSIS — E1122 Type 2 diabetes mellitus with diabetic chronic kidney disease: Secondary | ICD-10-CM

## 2018-01-23 DIAGNOSIS — Z79899 Other long term (current) drug therapy: Secondary | ICD-10-CM | POA: Insufficient documentation

## 2018-01-23 DIAGNOSIS — IMO0001 Reserved for inherently not codable concepts without codable children: Secondary | ICD-10-CM

## 2018-01-23 DIAGNOSIS — C7961 Secondary malignant neoplasm of right ovary: Secondary | ICD-10-CM | POA: Diagnosis not present

## 2018-01-23 HISTORY — PX: ROBOTIC ASSISTED TOTAL HYSTERECTOMY WITH BILATERAL SALPINGO OOPHERECTOMY: SHX6086

## 2018-01-23 HISTORY — PX: SENTINEL NODE BIOPSY: SHX6608

## 2018-01-23 LAB — GLUCOSE, CAPILLARY
Glucose-Capillary: 134 mg/dL — ABNORMAL HIGH (ref 70–99)
Glucose-Capillary: 137 mg/dL — ABNORMAL HIGH (ref 70–99)
Glucose-Capillary: 181 mg/dL — ABNORMAL HIGH (ref 70–99)
Glucose-Capillary: 190 mg/dL — ABNORMAL HIGH (ref 70–99)
Glucose-Capillary: 248 mg/dL — ABNORMAL HIGH (ref 70–99)
Glucose-Capillary: 291 mg/dL — ABNORMAL HIGH (ref 70–99)

## 2018-01-23 LAB — TYPE AND SCREEN
ABO/RH(D): AB POS
Antibody Screen: NEGATIVE

## 2018-01-23 SURGERY — HYSTERECTOMY, TOTAL, ROBOT-ASSISTED, LAPAROSCOPIC, WITH BILATERAL SALPINGO-OOPHORECTOMY
Anesthesia: General

## 2018-01-23 MED ORDER — CELECOXIB 200 MG PO CAPS
400.0000 mg | ORAL_CAPSULE | ORAL | Status: AC
  Administered 2018-01-23: 400 mg via ORAL
  Filled 2018-01-23: qty 2

## 2018-01-23 MED ORDER — HYDROCHLOROTHIAZIDE 12.5 MG PO CAPS
12.5000 mg | ORAL_CAPSULE | Freq: Every day | ORAL | Status: DC
  Administered 2018-01-23 – 2018-01-24 (×2): 12.5 mg via ORAL
  Filled 2018-01-23 (×2): qty 1

## 2018-01-23 MED ORDER — ONDANSETRON HCL 4 MG/2ML IJ SOLN
INTRAMUSCULAR | Status: AC
  Filled 2018-01-23: qty 2

## 2018-01-23 MED ORDER — SUGAMMADEX SODIUM 200 MG/2ML IV SOLN
INTRAVENOUS | Status: DC | PRN
  Administered 2018-01-23: 200 mg via INTRAVENOUS

## 2018-01-23 MED ORDER — STERILE WATER FOR INJECTION IJ SOLN
INTRAMUSCULAR | Status: DC | PRN
  Administered 2018-01-23: 10 mL

## 2018-01-23 MED ORDER — ACETAMINOPHEN 500 MG PO TABS
1000.0000 mg | ORAL_TABLET | ORAL | Status: AC
  Administered 2018-01-23: 1000 mg via ORAL
  Filled 2018-01-23: qty 2

## 2018-01-23 MED ORDER — MIDAZOLAM HCL 2 MG/2ML IJ SOLN
INTRAMUSCULAR | Status: AC
  Filled 2018-01-23: qty 2

## 2018-01-23 MED ORDER — LISINOPRIL 20 MG PO TABS
20.0000 mg | ORAL_TABLET | Freq: Every day | ORAL | Status: DC
  Administered 2018-01-23 – 2018-01-24 (×2): 20 mg via ORAL
  Filled 2018-01-23 (×2): qty 1

## 2018-01-23 MED ORDER — CEFAZOLIN SODIUM-DEXTROSE 2-4 GM/100ML-% IV SOLN
2.0000 g | INTRAVENOUS | Status: AC
  Administered 2018-01-23: 2 g via INTRAVENOUS
  Filled 2018-01-23: qty 100

## 2018-01-23 MED ORDER — DEXAMETHASONE SODIUM PHOSPHATE 10 MG/ML IJ SOLN
INTRAMUSCULAR | Status: DC | PRN
  Administered 2018-01-23: 10 mg via INTRAVENOUS

## 2018-01-23 MED ORDER — LACTATED RINGERS IR SOLN
Status: DC | PRN
  Administered 2018-01-23: 1000 mL

## 2018-01-23 MED ORDER — INSULIN ASPART PROT & ASPART (70-30 MIX) 100 UNIT/ML ~~LOC~~ SUSP
15.0000 [IU] | Freq: Two times a day (BID) | SUBCUTANEOUS | Status: DC
  Administered 2018-01-23 – 2018-01-24 (×2): 15 [IU] via SUBCUTANEOUS
  Filled 2018-01-23 (×2): qty 10

## 2018-01-23 MED ORDER — SIMVASTATIN 10 MG PO TABS
10.0000 mg | ORAL_TABLET | Freq: Every day | ORAL | Status: DC
  Administered 2018-01-23: 10 mg via ORAL
  Filled 2018-01-23: qty 1

## 2018-01-23 MED ORDER — KETOROLAC TROMETHAMINE 15 MG/ML IJ SOLN
15.0000 mg | Freq: Four times a day (QID) | INTRAMUSCULAR | Status: DC | PRN
  Administered 2018-01-23 – 2018-01-24 (×2): 15 mg via INTRAVENOUS
  Filled 2018-01-23: qty 1

## 2018-01-23 MED ORDER — STERILE WATER FOR IRRIGATION IR SOLN
Status: DC | PRN
  Administered 2018-01-23: 1000 mL

## 2018-01-23 MED ORDER — KETOROLAC TROMETHAMINE 15 MG/ML IJ SOLN
INTRAMUSCULAR | Status: AC
  Filled 2018-01-23: qty 1

## 2018-01-23 MED ORDER — FENTANYL CITRATE (PF) 100 MCG/2ML IJ SOLN
25.0000 ug | INTRAMUSCULAR | Status: DC | PRN
  Administered 2018-01-23: 50 ug via INTRAVENOUS
  Administered 2018-01-23 (×2): 25 ug via INTRAVENOUS

## 2018-01-23 MED ORDER — OXYCODONE HCL 5 MG PO TABS: 5.0000 mg | ORAL_TABLET | Freq: Once | ORAL | Status: DC | PRN

## 2018-01-23 MED ORDER — DEXAMETHASONE SODIUM PHOSPHATE 10 MG/ML IJ SOLN
INTRAMUSCULAR | Status: AC
  Filled 2018-01-23: qty 1

## 2018-01-23 MED ORDER — FENTANYL CITRATE (PF) 100 MCG/2ML IJ SOLN
INTRAMUSCULAR | Status: AC
  Filled 2018-01-23: qty 2

## 2018-01-23 MED ORDER — SCOPOLAMINE 1 MG/3DAYS TD PT72
1.0000 | MEDICATED_PATCH | TRANSDERMAL | Status: DC
  Administered 2018-01-23: 1.5 mg via TRANSDERMAL
  Filled 2018-01-23: qty 1

## 2018-01-23 MED ORDER — LACTATED RINGERS IV SOLN
INTRAVENOUS | Status: DC
  Administered 2018-01-23 (×2): via INTRAVENOUS

## 2018-01-23 MED ORDER — MIDAZOLAM HCL 2 MG/2ML IJ SOLN
INTRAMUSCULAR | Status: DC | PRN
  Administered 2018-01-23: 2 mg via INTRAVENOUS

## 2018-01-23 MED ORDER — ONDANSETRON HCL 4 MG/2ML IJ SOLN: 4.0000 mg | Freq: Four times a day (QID) | INTRAMUSCULAR | Status: DC | PRN

## 2018-01-23 MED ORDER — ENOXAPARIN SODIUM 40 MG/0.4ML ~~LOC~~ SOLN
40.0000 mg | SUBCUTANEOUS | Status: DC
  Administered 2018-01-24: 40 mg via SUBCUTANEOUS
  Filled 2018-01-23: qty 0.4

## 2018-01-23 MED ORDER — FENTANYL CITRATE (PF) 100 MCG/2ML IJ SOLN
INTRAMUSCULAR | Status: AC
  Administered 2018-01-23: 25 ug via INTRAVENOUS
  Filled 2018-01-23: qty 2

## 2018-01-23 MED ORDER — ACETAMINOPHEN 500 MG PO TABS
1000.0000 mg | ORAL_TABLET | Freq: Four times a day (QID) | ORAL | Status: DC
  Administered 2018-01-23 – 2018-01-24 (×4): 1000 mg via ORAL
  Filled 2018-01-23 (×5): qty 2

## 2018-01-23 MED ORDER — SUGAMMADEX SODIUM 200 MG/2ML IV SOLN
INTRAVENOUS | Status: AC
  Filled 2018-01-23: qty 2

## 2018-01-23 MED ORDER — ENOXAPARIN SODIUM 40 MG/0.4ML ~~LOC~~ SOLN
40.0000 mg | SUBCUTANEOUS | Status: AC
  Administered 2018-01-23: 40 mg via SUBCUTANEOUS
  Filled 2018-01-23: qty 0.4

## 2018-01-23 MED ORDER — ONDANSETRON HCL 4 MG PO TABS: 4.0000 mg | ORAL_TABLET | Freq: Four times a day (QID) | ORAL | Status: DC | PRN

## 2018-01-23 MED ORDER — INDOCYANINE GREEN 25 MG IV SOLR
INTRAVENOUS | Status: DC | PRN
  Administered 2018-01-23: 2.5 mg

## 2018-01-23 MED ORDER — STERILE WATER FOR INJECTION IJ SOLN
INTRAMUSCULAR | Status: AC
  Filled 2018-01-23: qty 10

## 2018-01-23 MED ORDER — ONDANSETRON HCL 4 MG/2ML IJ SOLN: 4.0000 mg | Freq: Once | INTRAMUSCULAR | Status: DC | PRN

## 2018-01-23 MED ORDER — GABAPENTIN 300 MG PO CAPS
300.0000 mg | ORAL_CAPSULE | Freq: Two times a day (BID) | ORAL | Status: DC
  Administered 2018-01-23 – 2018-01-24 (×3): 300 mg via ORAL
  Filled 2018-01-23 (×3): qty 1

## 2018-01-23 MED ORDER — PROPOFOL 10 MG/ML IV BOLUS
INTRAVENOUS | Status: DC | PRN
  Administered 2018-01-23: 150 mg via INTRAVENOUS

## 2018-01-23 MED ORDER — KCL IN DEXTROSE-NACL 20-5-0.45 MEQ/L-%-% IV SOLN
INTRAVENOUS | Status: DC
  Administered 2018-01-23: 12:00:00 via INTRAVENOUS
  Filled 2018-01-23: qty 1000

## 2018-01-23 MED ORDER — LIDOCAINE HCL (CARDIAC) PF 100 MG/5ML IV SOSY
PREFILLED_SYRINGE | INTRAVENOUS | Status: AC
  Filled 2018-01-23: qty 5

## 2018-01-23 MED ORDER — SUCCINYLCHOLINE CHLORIDE 200 MG/10ML IV SOSY
PREFILLED_SYRINGE | INTRAVENOUS | Status: AC
  Filled 2018-01-23: qty 10

## 2018-01-23 MED ORDER — HYDROMORPHONE HCL 1 MG/ML IJ SOLN: 0.5000 mg | INTRAMUSCULAR | Status: DC | PRN

## 2018-01-23 MED ORDER — OXYCODONE HCL 5 MG PO TABS: 5.0000 mg | ORAL_TABLET | ORAL | Status: DC | PRN

## 2018-01-23 MED ORDER — LIDOCAINE 2% (20 MG/ML) 5 ML SYRINGE
INTRAMUSCULAR | Status: DC | PRN
  Administered 2018-01-23: 100 mg via INTRAVENOUS

## 2018-01-23 MED ORDER — HEMOSTATIC AGENTS (NO CHARGE) OPTIME
TOPICAL | Status: DC | PRN
  Administered 2018-01-23: 1 via TOPICAL

## 2018-01-23 MED ORDER — GABAPENTIN 300 MG PO CAPS
300.0000 mg | ORAL_CAPSULE | ORAL | Status: AC
  Administered 2018-01-23: 300 mg via ORAL
  Filled 2018-01-23: qty 1

## 2018-01-23 MED ORDER — ROCURONIUM BROMIDE 100 MG/10ML IV SOLN
INTRAVENOUS | Status: AC
  Filled 2018-01-23: qty 1

## 2018-01-23 MED ORDER — ROCURONIUM BROMIDE 100 MG/10ML IV SOLN
INTRAVENOUS | Status: DC | PRN
  Administered 2018-01-23: 50 mg via INTRAVENOUS
  Administered 2018-01-23: 10 mg via INTRAVENOUS

## 2018-01-23 MED ORDER — INSULIN ASPART 100 UNIT/ML ~~LOC~~ SOLN
0.0000 [IU] | SUBCUTANEOUS | Status: DC
  Administered 2018-01-23: 5 [IU] via SUBCUTANEOUS
  Administered 2018-01-23: 8 [IU] via SUBCUTANEOUS
  Administered 2018-01-23: 3 [IU] via SUBCUTANEOUS
  Administered 2018-01-24: 2 [IU] via SUBCUTANEOUS

## 2018-01-23 MED ORDER — OXYCODONE HCL 5 MG/5ML PO SOLN
5.0000 mg | Freq: Once | ORAL | Status: DC | PRN
  Filled 2018-01-23: qty 5

## 2018-01-23 MED ORDER — FENTANYL CITRATE (PF) 100 MCG/2ML IJ SOLN
INTRAMUSCULAR | Status: DC | PRN
  Administered 2018-01-23 (×2): 50 ug via INTRAVENOUS

## 2018-01-23 MED ORDER — LISINOPRIL-HYDROCHLOROTHIAZIDE 20-12.5 MG PO TABS: 1.0000 | ORAL_TABLET | Freq: Every morning | ORAL | Status: DC

## 2018-01-23 MED ORDER — ONDANSETRON HCL 4 MG/2ML IJ SOLN
INTRAMUSCULAR | Status: DC | PRN
  Administered 2018-01-23: 4 mg via INTRAVENOUS

## 2018-01-23 MED ORDER — PROPOFOL 10 MG/ML IV BOLUS
INTRAVENOUS | Status: AC
  Filled 2018-01-23: qty 20

## 2018-01-23 MED ORDER — DIPHENHYDRAMINE HCL 25 MG PO CAPS
25.0000 mg | ORAL_CAPSULE | Freq: Once | ORAL | Status: AC
  Administered 2018-01-23: 25 mg via ORAL
  Filled 2018-01-23: qty 1

## 2018-01-23 MED ORDER — DEXAMETHASONE SODIUM PHOSPHATE 4 MG/ML IJ SOLN: 4.0000 mg | INTRAMUSCULAR | Status: DC

## 2018-01-23 SURGICAL SUPPLY — 48 items
APPLICATOR SURGIFLO ENDO (HEMOSTASIS) ×2 IMPLANT
BAG LAPAROSCOPIC 12 15 PORT 16 (BASKET) IMPLANT
BAG RETRIEVAL 12/15 (BASKET)
COVER BACK TABLE 60X90IN (DRAPES) ×2 IMPLANT
COVER TIP SHEARS 8 DVNC (MISCELLANEOUS) ×1 IMPLANT
COVER TIP SHEARS 8MM DA VINCI (MISCELLANEOUS) ×1
DERMABOND ADVANCED (GAUZE/BANDAGES/DRESSINGS) ×1
DERMABOND ADVANCED .7 DNX12 (GAUZE/BANDAGES/DRESSINGS) ×1 IMPLANT
DRAPE ARM DVNC X/XI (DISPOSABLE) ×4 IMPLANT
DRAPE COLUMN DVNC XI (DISPOSABLE) ×1 IMPLANT
DRAPE DA VINCI XI ARM (DISPOSABLE) ×4
DRAPE DA VINCI XI COLUMN (DISPOSABLE) ×1
DRAPE SHEET LG 3/4 BI-LAMINATE (DRAPES) ×2 IMPLANT
DRAPE SURG IRRIG POUCH 19X23 (DRAPES) ×2 IMPLANT
ELECT REM PT RETURN 15FT ADLT (MISCELLANEOUS) ×2 IMPLANT
GAUZE SPONGE 4X4 16PLY XRAY LF (GAUZE/BANDAGES/DRESSINGS) ×2 IMPLANT
GLOVE BIO SURGEON STRL SZ 6 (GLOVE) ×8 IMPLANT
GLOVE BIO SURGEON STRL SZ 6.5 (GLOVE) IMPLANT
GOWN STRL REUS W/ TWL LRG LVL3 (GOWN DISPOSABLE) ×2 IMPLANT
GOWN STRL REUS W/TWL LRG LVL3 (GOWN DISPOSABLE) ×2
HOLDER FOLEY CATH W/STRAP (MISCELLANEOUS) ×2 IMPLANT
IRRIG SUCT STRYKERFLOW 2 WTIP (MISCELLANEOUS) ×2
IRRIGATION SUCT STRKRFLW 2 WTP (MISCELLANEOUS) ×1 IMPLANT
KIT PROCEDURE DA VINCI SI (MISCELLANEOUS) ×1
KIT PROCEDURE DVNC SI (MISCELLANEOUS) ×1 IMPLANT
MANIPULATOR UTERINE 4.5 ZUMI (MISCELLANEOUS) ×2 IMPLANT
NEEDLE SPNL 18GX3.5 QUINCKE PK (NEEDLE) ×2 IMPLANT
OBTURATOR OPTICAL STANDARD 8MM (TROCAR) ×1
OBTURATOR OPTICAL STND 8 DVNC (TROCAR) ×1
OBTURATOR OPTICALSTD 8 DVNC (TROCAR) ×1 IMPLANT
PACK ROBOT GYN CUSTOM WL (TRAY / TRAY PROCEDURE) ×2 IMPLANT
PAD POSITIONING PINK XL (MISCELLANEOUS) ×2 IMPLANT
PORT ACCESS TROCAR AIRSEAL 12 (TROCAR) ×1 IMPLANT
PORT ACCESS TROCAR AIRSEAL 5M (TROCAR) ×1
POUCH SPECIMEN RETRIEVAL 10MM (ENDOMECHANICALS) IMPLANT
SEAL CANN UNIV 5-8 DVNC XI (MISCELLANEOUS) ×4 IMPLANT
SEAL XI 5MM-8MM UNIVERSAL (MISCELLANEOUS) ×4
SET TRI-LUMEN FLTR TB AIRSEAL (TUBING) ×2 IMPLANT
SURGIFLO W/THROMBIN 8M KIT (HEMOSTASIS) ×2 IMPLANT
SUT PROLENE 5 0 CC 1 (SUTURE) ×2 IMPLANT
SUT VIC AB 0 CT1 27 (SUTURE)
SUT VIC AB 0 CT1 27XBRD ANTBC (SUTURE) IMPLANT
SYR 10ML LL (SYRINGE) ×2 IMPLANT
TOWEL OR NON WOVEN STRL DISP B (DISPOSABLE) ×2 IMPLANT
TRAP SPECIMEN MUCOUS 40CC (MISCELLANEOUS) IMPLANT
TRAY FOLEY MTR SLVR 16FR STAT (SET/KITS/TRAYS/PACK) ×2 IMPLANT
UNDERPAD 30X30 (UNDERPADS AND DIAPERS) ×2 IMPLANT
WATER STERILE IRR 1000ML POUR (IV SOLUTION) ×2 IMPLANT

## 2018-01-23 NOTE — Transfer of Care (Signed)
Immediate Anesthesia Transfer of Care Note  Patient: Krista Johnson  Procedure(s) Performed: XI ROBOTIC ASSISTED TOTAL HYSTERECTOMY WITH BILATERAL SALPINGO OOPHORECTOMY (N/A ) SENTINEL NODE BIOPSY (N/A )  Patient Location: PACU  Anesthesia Type:General  Level of Consciousness: awake, drowsy and patient cooperative  Airway & Oxygen Therapy: Patient Spontanous Breathing and Patient connected to face mask oxygen  Post-op Assessment: Report given to RN and Post -op Vital signs reviewed and stable  Post vital signs: Reviewed and stable  Last Vitals:  Vitals Value Taken Time  BP 150/82 01/23/2018 10:06 AM  Temp    Pulse 65 01/23/2018 10:08 AM  Resp 17 01/23/2018 10:08 AM  SpO2 100 % 01/23/2018 10:08 AM  Vitals shown include unvalidated device data.  Last Pain:  Vitals:   01/23/18 0607  TempSrc: Oral         Complications: No apparent anesthesia complications

## 2018-01-23 NOTE — Anesthesia Procedure Notes (Signed)
Procedure Name: Intubation Date/Time: 01/23/2018 7:34 AM Performed by: Dione Booze, CRNA Pre-anesthesia Checklist: Suction available, Patient being monitored, Emergency Drugs available and Patient identified Patient Re-evaluated:Patient Re-evaluated prior to induction Oxygen Delivery Method: Circle system utilized Preoxygenation: Pre-oxygenation with 100% oxygen Induction Type: IV induction Ventilation: Mask ventilation without difficulty Laryngoscope Size: Mac and 4 Grade View: Grade I Tube type: Oral Tube size: 7.5 mm Number of attempts: 1 Airway Equipment and Method: Stylet Placement Confirmation: ETT inserted through vocal cords under direct vision,  breath sounds checked- equal and bilateral and positive ETCO2 Secured at: 22 cm Tube secured with: Tape Dental Injury: Teeth and Oropharynx as per pre-operative assessment

## 2018-01-23 NOTE — Interval H&P Note (Signed)
History and Physical Interval Note:  01/23/2018 7:06 AM  [name]  has presented today for surgery, with the diagnosis of endometrial cancer  The various methods of treatment have been discussed with the patient and family. After consideration of risks, benefits and other options for treatment, the patient has consented to  Procedure(s): XI ROBOTIC ASSISTED TOTAL HYSTERECTOMY WITH BILATERAL SALPINGO OOPHORECTOMY (N/A) SENTINEL NODE BIOPSY (N/A) as a surgical intervention .  The patient's history has been reviewed, patient examined, no change in status, stable for surgery.  I have reviewed the patient's chart and labs.  Questions were answered to the patient's satisfaction.     Thereasa Solo

## 2018-01-23 NOTE — Anesthesia Postprocedure Evaluation (Signed)
Anesthesia Post Note  Patient: Krista Johnson  Procedure(s) Performed: XI ROBOTIC ASSISTED TOTAL HYSTERECTOMY WITH BILATERAL SALPINGO OOPHORECTOMY (N/A ) SENTINEL NODE BIOPSY (N/A )     Patient location during evaluation: PACU Anesthesia Type: General Level of consciousness: awake and alert Pain management: pain level controlled Vital Signs Assessment: post-procedure vital signs reviewed and stable Respiratory status: spontaneous breathing, nonlabored ventilation, respiratory function stable and patient connected to nasal cannula oxygen Cardiovascular status: blood pressure returned to baseline and stable Postop Assessment: no apparent nausea or vomiting Anesthetic complications: no    Last Vitals:  Vitals:   01/23/18 1113 01/23/18 1131  BP: (!) 162/94 (!) 147/97  Pulse: 70 78  Resp: 18 15  Temp:  36.6 C  SpO2: 100% 100%    Last Pain:  Vitals:   01/23/18 1131  TempSrc: Oral  PainSc:                  Lidia Collum

## 2018-01-23 NOTE — Op Note (Signed)
OPERATIVE NOTE 01/23/18  Surgeon: Donaciano Eva   Assistants: Dr Precious Haws (an MD assistant was necessary for tissue manipulation, management of robotic instrumentation, retraction and positioning due to the complexity of the case and hospital policies).   Anesthesia: General endotracheal anesthesia  ASA Class: 3   Pre-operative Diagnosis: endometrial cancer grade 3  Post-operative Diagnosis: same,   Operation: Robotic-assisted laparoscopic total hysterectomy with bilateral salpingoophorectomy, SLN injection, mapping and biopsy, right pelvic and para-aortic lymphadenectomy  Surgeon: Donaciano Eva  Assistant Surgeon: Precious Haws MD  Anesthesia: GET  Urine Output: 300cc  Operative Findings:  : 10-12cm bulky uterus with frank serosal involvement on posterior cul de sac peritoneum and anterior peritoneum which adhesed the bladder to the anterior uterus. Unilateral mapping on left pelvis. No grossly suspicious nodes. Normal omentum and diaphragms.   Estimated Blood Loss:  less than 50 mL      Total IV Fluids: 900 ml         Specimens: uterus, cervix, bilateral tubes and ovaries, posterior cul de sac biopsy, right pelvic and para-aortic lymph nodes, left external iliac sentinel lymph node         Complications:  None; patient tolerated the procedure well.         Disposition: PACU - hemodynamically stable.  Procedure Details  The patient was seen in the Holding Room. The risks, benefits, complications, treatment options, and expected outcomes were discussed with the patient.  The patient concurred with the proposed plan, giving informed consent.  The site of surgery properly noted/marked. The patient was identified as Krista Johnson and the procedure verified as a Robotic-assisted hysterectomy with bilateral salpingo oophorectomy with SLN biopsy. A Time Out was held and the above information confirmed.  After induction of anesthesia, the patient was draped and  prepped in the usual sterile manner. Pt was placed in supine position after anesthesia and draped and prepped in the usual sterile manner. The abdominal drape was placed after the CholoraPrep had been allowed to dry for 3 minutes.  Her arms were tucked to her side with all appropriate precautions.  The shoulders were stabilized with padded shoulder blocks applied to the acromium processes.  The patient was placed in the semi-lithotomy position in [place].  The perineum was prepped with Betadine. The patient was then prepped. Foley catheter was placed.  A sterile speculum was placed in the vagina.  The cervix was grasped with a single-tooth tenaculum. 2mg  total of ICG was injected into the cervical stroma at 2 and 9 o'clock with 1cc injected at a 1cm and 98mm depth (concentration 0.5mg /ml) in all locations. The cervix was dilated with Kennon Rounds dilators.  The ZUMI uterine manipulator with a medium colpotomizer ring was placed without difficulty.  A pneum occluder balloon was placed over the manipulator.  OG tube placement was confirmed and to suction.   Next, a 5 mm skin incision was made 1 cm below the subcostal margin in the midclavicular line.  The 5 mm Optiview port and scope was used for direct entry.  Opening pressure was under 10 mm CO2.  The abdomen was insufflated and the findings were noted as above.   At this point and all points during the procedure, the patient's intra-abdominal pressure did not exceed 15 mmHg. Next, a 10 mm skin incision was made in the umbilicus and a right and left port was placed about 10 cm lateral to the robot port on the right and left side.  A fourth arm was  placed in the left lower quadrant 2 cm above and superior and medial to the anterior superior iliac spine.  All ports were placed under direct visualization.  The patient was placed in steep Trendelenburg.  Bowel was folded away into the upper abdomen.  The robot was docked in the normal manner.  The right and left  peritoneum were opened parallel to the IP ligament to open the retroperitoneal spaces bilaterally. The SLN mapping was performed in bilateral pelvic basins. The para rectal and paravesical spaces were opened up entirely with careful dissection below the level of the ureters bilaterally and to the depth of the uterine artery origin in order to skeletonize the uterine "web" and ensure visualization of all parametrial channels. The para-aortic basins were carefully exposed and evaluated for isolated para-aortic SLN's. Lymphatic channels were identified travelling to the following visualized sentinel lymph node's: left external iliac SLN. These SLN's were separated from their surrounding lymphatic tissue, removed and sent for permanent pathology. Due to unilateral mapping to the left, a right sided pelvic and para-aortic lymhadenectomy was performed in accordance with the NCCN algorithm.  The right paravesical space was developed with monopolar and sharp dissection. It was held open with tension on the median umbilical ligament with the forth arm. The paravesical space was opened with blunt and sharp dissection to mobilize the ureter off of the medial surface of the internal iliac artery. The medial leaf of the broad ligament containing the ureter was held medially (opening the pararectal space) by the assistant's grasper. The right pelvic lymphadenectomy was performed by skeletonizing the internal iliac artery at the bifurcation with the external iliac artery. The obturator nerve was identified in the base of lateral paravesical space. The ureter was mobilized medially off of the dissection by developing the pararectal space. The genitofemoral nerve was identified, skeletonized and mobilized laterally off of the external iliac artery. An enbloc resection of lymph nodes was performed within the following boundaries: the mid portion of the common iliac proximally, the circumflex iliac vein distally, the obturator nerve  posteriorally, the genitofemoral nerve laterally. The nodal basin (including obturator space) were confirmed to be empty of nodes and hemostatic. The nodes were placed in an endocatch bag and retrieved vaginally.  The para-aortic lymph node dissection was then performed on the right. The peritoneum overlying the lateral surface of the common iliac and vena cava was opened vertically towards the duodenum. It was elevated as a shelf and the ureter was identified in this retroperitoneum. It was mobilized and retracted laterally with the 4th arm. The right para-aortic dissection took place by separating an enbloc segment of node containing fat from the mid portion of the common iliac distally, the retroperitoneal duodenum superiorally, the genitofemoral nerve laterally and the aorta medially.  Hemostasis was confirmed and was reinforced with Floseal.  The hysterectomy was started after the round ligament on the right side was incised and the retroperitoneum was entered and the pararectal space was developed.  The ureter was noted to be on the medial leaf of the broad ligament.  The peritoneum above the ureter was incised and stretched and the infundibulopelvic ligament was skeletonized, cauterized and cut.  The posterior peritoneum was taken down to the level of the KOH ring.  The anterior peritoneum was also taken down.  The bladder flap was created to the level of the KOH ring.  The uterine artery on the right side was skeletonized, cauterized and cut in the normal manner.  A similar procedure was  performed on the left.  The colpotomy was made and the uterus, cervix, bilateral ovaries and tubes were amputated and delivered through the vagina.  Pedicles were inspected and excellent hemostasis was achieved.    The colpotomy at the vaginal cuff was closed with Vicryl on a CT1 needle in a running manner.  Irrigation was used and excellent hemostasis was achieved.  At this point in the procedure was completed.   Robotic instruments were removed under direct visulaization.  The robot was undocked. The 10 mm ports were closed with Vicryl on a UR-5 needle and the fascia was closed with 0 Vicryl on a UR-5 needle.  The skin was closed with 4-0 Vicryl in a subcuticular manner.  Dermabond was applied.  Sponge, lap and needle counts correct x 2.  The patient was taken to the recovery room in stable condition.  The vagina was swabbed with  minimal bleeding noted.   All instrument and needle counts were correct x  3.   The patient was transferred to the recovery room in a stable condition.  Donaciano Eva, MD

## 2018-01-24 ENCOUNTER — Encounter (HOSPITAL_COMMUNITY): Payer: Self-pay | Admitting: Gynecologic Oncology

## 2018-01-24 DIAGNOSIS — C541 Malignant neoplasm of endometrium: Secondary | ICD-10-CM | POA: Diagnosis not present

## 2018-01-24 LAB — BASIC METABOLIC PANEL
Anion gap: 9 (ref 5–15)
BUN: 25 mg/dL — ABNORMAL HIGH (ref 8–23)
CO2: 23 mmol/L (ref 22–32)
Calcium: 9.8 mg/dL (ref 8.9–10.3)
Chloride: 109 mmol/L (ref 98–111)
Creatinine, Ser: 1.41 mg/dL — ABNORMAL HIGH (ref 0.44–1.00)
GFR calc Af Amer: 44 mL/min — ABNORMAL LOW (ref >60)
GFR calc non Af Amer: 38 mL/min — ABNORMAL LOW (ref >60)
Glucose, Bld: 164 mg/dL — ABNORMAL HIGH (ref 70–99)
Potassium: 4.1 mmol/L (ref 3.5–5.1)
Sodium: 141 mmol/L (ref 135–145)

## 2018-01-24 LAB — FRUCTOSAMINE: Fructosamine: 296 umol/L — ABNORMAL HIGH (ref 190–270)

## 2018-01-24 LAB — CBC
HCT: 31.4 % — ABNORMAL LOW (ref 36.0–46.0)
Hemoglobin: 9.7 g/dL — ABNORMAL LOW (ref 12.0–15.0)
MCH: 27.6 pg (ref 26.0–34.0)
MCHC: 30.9 g/dL (ref 30.0–36.0)
MCV: 89.2 fL (ref 80.0–100.0)
Platelets: 250 10*3/uL (ref 150–400)
RBC: 3.52 MIL/uL — ABNORMAL LOW (ref 3.87–5.11)
RDW: 13.4 % (ref 11.5–15.5)
WBC: 7.9 10*3/uL (ref 4.0–10.5)
nRBC: 0 % (ref 0.0–0.2)

## 2018-01-24 LAB — PTH, INTACT AND CALCIUM
Calcium: 10.4 mg/dL (ref 8.6–10.4)
PTH: 102 pg/mL — ABNORMAL HIGH (ref 14–64)

## 2018-01-24 LAB — GLUCOSE, CAPILLARY
Glucose-Capillary: 146 mg/dL — ABNORMAL HIGH (ref 70–99)
Glucose-Capillary: 129 mg/dL — ABNORMAL HIGH (ref 70–99)
Glucose-Capillary: 196 mg/dL — ABNORMAL HIGH (ref 70–99)

## 2018-01-24 MED ORDER — TRAMADOL HCL 50 MG PO TABS: 50.0000 mg | ORAL_TABLET | Freq: Four times a day (QID) | ORAL | 0 refills | Status: DC | PRN

## 2018-01-24 MED ORDER — SENNOSIDES-DOCUSATE SODIUM 8.6-50 MG PO TABS: 1.0000 | ORAL_TABLET | Freq: Every evening | ORAL | 0 refills | Status: DC | PRN

## 2018-01-24 NOTE — Discharge Summary (Signed)
Physician Discharge Summary  Patient ID: STELLAR GENSEL MRN: [id number] DOB/AGE: 1950-08-01 67 y.o.  Admit date: 01/23/2018 Discharge date: 01/24/2018  Admission Diagnoses: Endometrial cancer [facility])  Discharge Diagnoses:  Principal Problem:   Endometrial cancer [facility])   Discharged Condition:  The patient is in good condition and stable for discharge.   Hospital Course: On 01/23/2018, the patient underwent the following: Procedure(s): XI ROBOTIC ASSISTED TOTAL HYSTERECTOMY WITH BILATERAL SALPINGO OOPHORECTOMY SENTINEL NODE BIOPSY.  The postoperative course was uneventful.  She was discharged to home on postoperative day 1 tolerating a regular diet, ambulating, pain controlled, voiding, passing flatus. She will be set up to meet with Dr. [name], Medical Oncologist, to discuss chemotherapy on an outpatient basis.  Consults: None  Significant Diagnostic Studies: None  Treatments: surgery: see above  Discharge Exam: Blood pressure 123/73, pulse 80, temperature 98.8 F (37.1 C), temperature source Oral, resp. rate 15, height 5' 6" (1.676 m), weight 188 lb 15 oz (85.7 kg), SpO2 100 %. General appearance: alert, cooperative and no distress Resp: clear to auscultation bilaterally Cardio: regular rate and rhythm, S1, S2 normal, no murmur, click, rub or gallop GI: soft, non-tender; bowel sounds normal; no masses,  no organomegaly Extremities: extremities normal, atraumatic, no cyanosis or edema Incision/Wound: Lap sites to the abdomen intact with dermabond, no active drainage or bleeding noted, mild ecchymosis noted around left mid abd lap site.  Disposition: Discharge disposition: 01-Home or Self Care       Discharge Instructions    Call MD for:  difficulty breathing, headache or visual disturbances   Complete by:  As directed    Call MD for:  extreme fatigue   Complete by:  As directed    Call MD for:  hives   Complete by:  As directed    Call MD for:  persistant dizziness or  light-headedness   Complete by:  As directed    Call MD for:  persistant nausea and vomiting   Complete by:  As directed    Call MD for:  redness, tenderness, or signs of infection (pain, swelling, redness, odor or green/yellow discharge around incision site)   Complete by:  As directed    Call MD for:  severe uncontrolled pain   Complete by:  As directed    Call MD for:  temperature >100.4   Complete by:  As directed    Diet - low sodium heart healthy   Complete by:  As directed    Driving Restrictions   Complete by:  As directed    No driving for 1 week.  Do not take narcotics and drive.   Increase activity slowly   Complete by:  As directed    Lifting restrictions   Complete by:  As directed    No lifting greater than 10 lbs.   Sexual Activity Restrictions   Complete by:  As directed    No sexual activity, nothing in the vagina, for 8 weeks.     Allergies as of 01/24/2018      Reactions   Sulfa Antibiotics Nausea Only   Sulfamethoxazole Nausea Only      Medication List    STOP taking these medications   aspirin EC 81 MG tablet     TAKE these medications   blood glucose meter kit and supplies Kit Dispense based on patient and insurance preference. Use up to four times daily as directed. (FOR ICD-9 250.00, 250.01).   Insulin Isophane & Regular Human (70-30) 100 UNIT/ML PEN  Commonly known as:  HUMULIN 70/30 MIX Inject 25 Units into the skin daily with breakfast. and pen needles 1/day   ketoconazole 2 % cream Commonly known as:  NIZORAL Apply to web space twice a day for 1 week then daily for 1 month What changed:    how much to take  how to take this  when to take this  reasons to take this   lisinopril-hydrochlorothiazide 20-12.5 MG tablet Commonly known as:  PRINZIDE,ZESTORETIC Take 1 tablet by mouth daily. What changed:  when to take this   [facility] DELICA LANCETS 38B Misc 3 (three) times daily. as directed   ONETOUCH VERIO test strip Generic drug:   glucose blood 3 (three) times daily. as directed   senna-docusate 8.6-50 MG tablet Commonly known as:  Senokot-S Take 1 tablet by mouth at bedtime as needed for mild constipation or moderate constipation.   simvastatin 10 MG tablet Commonly known as:  ZOCOR Take 10 mg by mouth at bedtime.   traMADol 50 MG tablet Commonly known as:  ULTRAM Take 1 tablet (50 mg total) by mouth every 6 (six) hours as needed for severe pain.   Vitamin D (Ergocalciferol) 2000 units Caps Take 2,000 Units by mouth daily.      Follow-up Information    Everitt Amber, MD Follow up on 02/08/2018.   Specialty:  Gynecologic Oncology Why:  at 11:30am at the [facility] for post-op follow up Contact information: [address] [place] [address] [phone]           Greater than thirty minutes were spend for face to face discharge instructions and discharge orders/summary in EPIC.   Signed: Dorothyann Gibbs 01/24/2018, 8:53 AM

## 2018-01-24 NOTE — Discharge Instructions (Signed)
01/23/2018  Return to work: 4 weeks  Activity: 1. Be up and out of the bed during the day.  Take a nap if needed.  You may walk up steps but be careful and use the hand rail.  Stair climbing will tire you more than you think, you may need to stop part way and rest.   2. No lifting or straining for 6 weeks.  3. No driving for 1 weeks.  Do Not drive if you are taking narcotic pain medicine.  4. Shower daily.  Use soap and water on your incision and pat dry; don't rub.   5. No sexual activity and nothing in the vagina for 8 weeks.  Medications:  - Take tylenol first line for pain control. Take these regularly (every 6 hours) to decrease the build up of pain.  - If necessary, for severe pain not relieved by tylenol, take tramadol.  - While taking tramadol you should take sennakot every night to reduce the likelihood of constipation. If this causes diarrhea, stop its use.  Diet: 1. Low sodium Heart Healthy Diet is recommended.  2. It is safe to use a laxative if you have difficulty moving your bowels.   Wound Care: 1. Keep clean and dry.  Shower daily.  Reasons to call the Doctor:   Fever - Oral temperature greater than 100.4 degrees Fahrenheit  Foul-smelling vaginal discharge  Difficulty urinating  Nausea and vomiting  Increased pain at the site of the incision that is unrelieved with pain medicine.  Difficulty breathing with or without chest pain  New calf pain especially if only on one side  Sudden, continuing increased vaginal bleeding with or without clots.   Follow-up: 1. See Everitt Amber in 3 weeks.  Contacts: For questions or concerns you should contact:  Dr. Everitt Amber at [phone] After hours and on week-ends call [phone] and ask to speak to the physician on call for Gynecologic Oncology   Tramadol tablets What is this medicine? TRAMADOL (TRA ma dole) is a pain reliever. It is used to treat moderate to severe pain in adults. This medicine may be  used for other purposes; ask your health care provider or pharmacist if you have questions. COMMON BRAND NAME(S): Ultram What should I tell my health care provider before I take this medicine? They need to know if you have any of these conditions: -brain tumor -depression -drug abuse or addiction -head injury -if you frequently drink alcohol containing drinks -kidney disease or trouble passing urine -liver disease -lung disease, asthma, or breathing problems -seizures or epilepsy -suicidal thoughts, plans, or attempt; a previous suicide attempt by you or a family member -an unusual or allergic reaction to tramadol, codeine, other medicines, foods, dyes, or preservatives -pregnant or trying to get pregnant -breast-feeding How should I use this medicine? Take this medicine by mouth with a full glass of water. Follow the directions on the prescription label. You can take it with or without food. If it upsets your stomach, take it with food. Do not take your medicine more often than directed. A special MedGuide will be given to you by the pharmacist with each prescription and refill. Be sure to read this information carefully each time. Talk to your pediatrician regarding the use of this medicine in children. Special care may be needed. Overdosage: If you think you have taken too much of this medicine contact a poison control center or emergency room at once. NOTE: This medicine is only for you. Do  not share this medicine with others. What if I miss a dose? If you miss a dose, take it as soon as you can. If it is almost time for your next dose, take only that dose. Do not take double or extra doses. What may interact with this medicine? Do not take this medication with any of the following medicines: -MAOIs like Carbex, Eldepryl, Marplan, Nardil, and Parnate This medicine may also interact with the following medications: -alcohol -antihistamines for allergy, cough and cold -certain  medicines for anxiety or sleep -certain medicines for depression like amitriptyline, fluoxetine, sertraline -certain medicines for migraine headache like almotriptan, eletriptan, frovatriptan, naratriptan, rizatriptan, sumatriptan, zolmitriptan -certain medicines for seizures like carbamazepine, oxcarbazepine, phenobarbital, primidone -certain medicines that treat or prevent blood clots like warfarin -digoxin -furazolidone -general anesthetics like halothane, isoflurane, methoxyflurane, propofol -linezolid -local anesthetics like lidocaine, pramoxine, tetracaine -medicines that relax muscles for surgery -other narcotic medicines for pain or cough -phenothiazines like chlorpromazine, mesoridazine, prochlorperazine, thioridazine -procarbazine This list may not describe all possible interactions. Give your health care provider a list of all the medicines, herbs, non-prescription drugs, or dietary supplements you use. Also tell them if you smoke, drink alcohol, or use illegal drugs. Some items may interact with your medicine. What should I watch for while using this medicine? Tell your doctor or health care professional if your pain does not go away, if it gets worse, or if you have new or a different type of pain. You may develop tolerance to the medicine. Tolerance means that you will need a higher dose of the medicine for pain relief. Tolerance is normal and is expected if you take this medicine for a long time. Do not suddenly stop taking your medicine because you may develop a severe reaction. Your body becomes used to the medicine. This does NOT mean you are addicted. Addiction is a behavior related to getting and using a drug for a non-medical reason. If you have pain, you have a medical reason to take pain medicine. Your doctor will tell you how much medicine to take. If your doctor wants you to stop the medicine, the dose will be slowly lowered over time to avoid any side effects. There are  different types of narcotic medicines (opiates). If you take more than one type at the same time or if you are taking another medicine that also causes drowsiness, you may have more side effects. Give your health care provider a list of all medicines you use. Your doctor will tell you how much medicine to take. Do not take more medicine than directed. Call emergency for help if you have problems breathing or unusual sleepiness. You may get drowsy or dizzy. Do not drive, use machinery, or do anything that needs mental alertness until you know how this medicine affects you. Do not stand or sit up quickly, especially if you are an older patient. This reduces the risk of dizzy or fainting spells. Alcohol can increase or decrease the effects of this medicine. Avoid alcoholic drinks. You may have constipation. Try to have a bowel movement at least every 2 to 3 days. If you do not have a bowel movement for 3 days, call your doctor or health care professional. Your mouth may get dry. Chewing sugarless gum or sucking hard candy, and drinking plenty of water may help. Contact your doctor if the problem does not go away or is severe. What side effects may I notice from receiving this medicine? Side effects that you should report  to your doctor or health care professional as soon as possible: -allergic reactions like skin rash, itching or hives, swelling of the face, lips, or tongue -breathing problems -confusion -seizures -signs and symptoms of low blood pressure like dizziness; feeling faint or lightheaded, falls; unusually weak or tired -trouble passing urine or change in the amount of urine Side effects that usually do not require medical attention (report to your doctor or health care professional if they continue or are bothersome): -constipation -dry mouth -nausea, vomiting -tiredness This list may not describe all possible side effects. Call your doctor for medical advice about side effects. You may  report side effects to FDA at 1-800-FDA-1088. Where should I keep my medicine? Keep out of the reach of children. This medicine may cause accidental overdose and death if it taken by other adults, children, or pets. Mix any unused medicine with a substance like cat litter or coffee grounds. Then throw the medicine away in a sealed container like a sealed bag or a coffee can with a lid. Do not use the medicine after the expiration date. Store at room temperature between 15 and 30 degrees C (59 and 86 degrees F). NOTE: This sheet is a summary. It may not cover all possible information. If you have questions about this medicine, talk to your doctor, pharmacist, or health care provider.  2018 Elsevier/Gold Standard (2014-12-28 09:00:04)

## 2018-01-24 NOTE — Progress Notes (Signed)
Discharge instructions discussed with patient, verbalized agreement and understanding 

## 2018-01-25 ENCOUNTER — Telehealth: Payer: Self-pay | Admitting: Oncology

## 2018-01-25 NOTE — Telephone Encounter (Signed)
Called Krista Johnson and asked if we could move up her lymph node biopsy from 02/12/18 to next week.  She said that would be fine.  Rescheduled appointment to 02/01/18 at 11:00.  Called patient back and advised her of appointment date and time and to not eat or drink anything after 7 am on 02/01/18.  She verbalized understanding.  Also notified her of appointment with Dr. Alvy Bimler on 02/08/18 at 12:15 pm.

## 2018-01-30 ENCOUNTER — Telehealth: Payer: Self-pay | Admitting: Gynecologic Oncology

## 2018-01-30 DIAGNOSIS — C541 Malignant neoplasm of endometrium: Secondary | ICD-10-CM

## 2018-01-30 NOTE — Telephone Encounter (Signed)
Informed about stage 3A serous cancer. Recommendation is for adjuvant chemotherapy and vaginal brachytherapy.  Thereasa Solo, MD

## 2018-01-31 ENCOUNTER — Other Ambulatory Visit: Payer: Self-pay

## 2018-01-31 ENCOUNTER — Encounter: Payer: Self-pay | Admitting: Radiation Oncology

## 2018-01-31 ENCOUNTER — Other Ambulatory Visit: Payer: Self-pay | Admitting: Radiology

## 2018-01-31 ENCOUNTER — Telehealth: Payer: Self-pay | Admitting: Endocrinology

## 2018-01-31 NOTE — Progress Notes (Signed)
GYN Location of Tumor / Histology: Diagnosis 1. Lymph node, sentinel, biopsy, left external iliac - NO CARCINOMA IDENTIFIED IN ONE LYMPH NODE (0/1) - SEE COMMENT 2. Lymph nodes, regional resection, right para aortic - NO CARCINOMA IDENTIFIED IN FOUR LYMPH NODES (0/4) - SEE COMMENT 3. Lymph nodes, regional resection, right pelvic - NO CARCINOMA IDENTIFIED IN EIGHT LYMPH NODES (0/8) - SEE COMMENT 4. Cul-de-sac biopsy - METASTATIC CARCINOMA 5. 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  Patient denies any other pelvic symptoms and denies any GI or GU problems.   Past/Anticipated interventions by Gyn/Onc surgery, if any: 01/23/18:  Operation: Robotic-assisted laparoscopic total hysterectomy with bilateral salpingoophorectomy, SLN injection, mapping and biopsy, right pelvic and para-aortic lymphadenectomy  Surgeon: Donaciano Eva  Past/Anticipated interventions by medical oncology, if any: Consult Dr. Alvy Bimler scheduled 02/08/18.  Axillary Lymph Node Biopsy 02/01/18 results pending  Weight changes, if any: No  Bowel/Bladder complaints, if any: Pt denies any bladder complaints. Pt is taking Senekot every other day to maintain regular bowel movements.   Nausea/Vomiting, if any: No  Pain issues, if any:  Pt has  had post-surgical "soreness" in abdomin and right groin area but has not needed to take any pain medications.   SAFETY ISSUES:  Prior radiation? No  Pacemaker/ICD? No  Possible current pregnancy? N/A, pt is post-surgical  Is the patient on methotrexate? No  Current Complaints / other details:  Pt presents today for initial consult with Dr. Sondra Come for Radiation Oncology. Pt is unaccompanied. Pt is talkative.   BP 135/77 (BP Location: Left Arm, Patient Position: Sitting)   Pulse 96   Temp 98.1 F (36.7 C) (Oral)   Resp 20   Ht 5\' 6"  (1.676 m)   Wt 190 lb 9.6 oz (86.5 kg)   SpO2 98%   PF (!) 20 L/min   BMI 30.76 kg/m   Wt Readings from Last 3 Encounters:  02/05/18 190 lb 9.6 oz (86.5 kg)  01/24/18 188 lb 15 oz (85.7 kg)  01/22/18 190 lb 12.8 oz (86.5 kg)   Loma Sousa, RN BSN

## 2018-01-31 NOTE — Telephone Encounter (Signed)
Patient is requesting that the lab results done (calcium, Vitamin D and A1C) on 01/22/18 be sent/faxed to patient's PCP Dr. Jonathon Jordan.

## 2018-02-01 ENCOUNTER — Ambulatory Visit (HOSPITAL_COMMUNITY)
Admission: RE | Admit: 2018-02-01 | Discharge: 2018-02-01 | Disposition: A | Discharge status: Home / Self Care | Payer: Medicare Other | Source: Ambulatory Visit | Attending: Gynecologic Oncology | Admitting: Gynecologic Oncology

## 2018-02-01 ENCOUNTER — Encounter (HOSPITAL_COMMUNITY): Payer: Self-pay

## 2018-02-01 ENCOUNTER — Other Ambulatory Visit: Payer: Self-pay

## 2018-02-01 DIAGNOSIS — Z8249 Family history of ischemic heart disease and other diseases of the circulatory system: Secondary | ICD-10-CM | POA: Diagnosis not present

## 2018-02-01 DIAGNOSIS — N281 Cyst of kidney, acquired: Secondary | ICD-10-CM | POA: Diagnosis not present

## 2018-02-01 DIAGNOSIS — E213 Hyperparathyroidism, unspecified: Secondary | ICD-10-CM | POA: Diagnosis not present

## 2018-02-01 DIAGNOSIS — C801 Malignant (primary) neoplasm, unspecified: Secondary | ICD-10-CM | POA: Diagnosis not present

## 2018-02-01 DIAGNOSIS — M1990 Unspecified osteoarthritis, unspecified site: Secondary | ICD-10-CM | POA: Insufficient documentation

## 2018-02-01 DIAGNOSIS — Z90722 Acquired absence of ovaries, bilateral: Secondary | ICD-10-CM | POA: Diagnosis not present

## 2018-02-01 DIAGNOSIS — C773 Secondary and unspecified malignant neoplasm of axilla and upper limb lymph nodes: Secondary | ICD-10-CM | POA: Insufficient documentation

## 2018-02-01 DIAGNOSIS — C541 Malignant neoplasm of endometrium: Secondary | ICD-10-CM | POA: Diagnosis not present

## 2018-02-01 DIAGNOSIS — E785 Hyperlipidemia, unspecified: Secondary | ICD-10-CM | POA: Diagnosis not present

## 2018-02-01 DIAGNOSIS — Z9079 Acquired absence of other genital organ(s): Secondary | ICD-10-CM | POA: Insufficient documentation

## 2018-02-01 DIAGNOSIS — E1122 Type 2 diabetes mellitus with diabetic chronic kidney disease: Secondary | ICD-10-CM | POA: Diagnosis not present

## 2018-02-01 DIAGNOSIS — N189 Chronic kidney disease, unspecified: Secondary | ICD-10-CM | POA: Insufficient documentation

## 2018-02-01 DIAGNOSIS — Z86010 Personal history of colonic polyps: Secondary | ICD-10-CM | POA: Diagnosis not present

## 2018-02-01 DIAGNOSIS — Z90710 Acquired absence of both cervix and uterus: Secondary | ICD-10-CM | POA: Insufficient documentation

## 2018-02-01 DIAGNOSIS — Z794 Long term (current) use of insulin: Secondary | ICD-10-CM | POA: Diagnosis not present

## 2018-02-01 DIAGNOSIS — R590 Localized enlarged lymph nodes: Secondary | ICD-10-CM | POA: Diagnosis not present

## 2018-02-01 DIAGNOSIS — Z882 Allergy status to sulfonamides status: Secondary | ICD-10-CM | POA: Insufficient documentation

## 2018-02-01 DIAGNOSIS — I129 Hypertensive chronic kidney disease with stage 1 through stage 4 chronic kidney disease, or unspecified chronic kidney disease: Secondary | ICD-10-CM | POA: Insufficient documentation

## 2018-02-01 DIAGNOSIS — Z79899 Other long term (current) drug therapy: Secondary | ICD-10-CM | POA: Insufficient documentation

## 2018-02-01 DIAGNOSIS — Z90711 Acquired absence of uterus with remaining cervical stump: Secondary | ICD-10-CM | POA: Diagnosis not present

## 2018-02-01 LAB — CBC
HCT: 38.2 % (ref 36.0–46.0)
Hemoglobin: 11.6 g/dL — ABNORMAL LOW (ref 12.0–15.0)
MCH: 27.2 pg (ref 26.0–34.0)
MCHC: 30.4 g/dL (ref 30.0–36.0)
MCV: 89.7 fL (ref 80.0–100.0)
Platelets: 372 10*3/uL (ref 150–400)
RBC: 4.26 MIL/uL (ref 3.87–5.11)
RDW: 12.9 % (ref 11.5–15.5)
WBC: 5.6 10*3/uL (ref 4.0–10.5)
nRBC: 0 % (ref 0.0–0.2)

## 2018-02-01 LAB — PROTIME-INR
INR: 0.9
Prothrombin Time: 12 seconds (ref 11.4–15.2)

## 2018-02-01 LAB — APTT: aPTT: 29 seconds (ref 24–36)

## 2018-02-01 LAB — GLUCOSE, CAPILLARY: Glucose-Capillary: 168 mg/dL — ABNORMAL HIGH (ref 70–99)

## 2018-02-01 MED ORDER — FENTANYL CITRATE (PF) 100 MCG/2ML IJ SOLN
INTRAMUSCULAR | Status: AC
  Filled 2018-02-01: qty 2

## 2018-02-01 MED ORDER — FENTANYL CITRATE (PF) 100 MCG/2ML IJ SOLN
INTRAMUSCULAR | Status: AC | PRN
  Administered 2018-02-01 (×2): 50 ug via INTRAVENOUS

## 2018-02-01 MED ORDER — SODIUM CHLORIDE 0.9 % IV SOLN
INTRAVENOUS | Status: DC
  Administered 2018-02-01: 12:00:00 via INTRAVENOUS

## 2018-02-01 MED ORDER — LIDOCAINE HCL 1 % IJ SOLN
INTRAMUSCULAR | Status: AC
  Filled 2018-02-01: qty 20

## 2018-02-01 MED ORDER — MIDAZOLAM HCL 2 MG/2ML IJ SOLN
INTRAMUSCULAR | Status: AC
  Filled 2018-02-01: qty 2

## 2018-02-01 MED ORDER — MIDAZOLAM HCL 2 MG/2ML IJ SOLN
INTRAMUSCULAR | Status: AC | PRN
  Administered 2018-02-01 (×2): 1 mg via INTRAVENOUS

## 2018-02-01 NOTE — Discharge Instructions (Signed)
Moderate Conscious Sedation, Adult, Care After °These instructions provide you with information about caring for yourself after your procedure. Your health care provider may also give you more specific instructions. Your treatment has been planned according to current medical practices, but problems sometimes occur. Call your health care provider if you have any problems or questions after your procedure. °What can I expect after the procedure? °After your procedure, it is common: °· To feel sleepy for several hours. °· To feel clumsy and have poor balance for several hours. °· To have poor judgment for several hours. °· To vomit if you eat too soon. ° °Follow these instructions at home: °For at least 24 hours after the procedure: ° °· Do not: °? Participate in activities where you could fall or become injured. °? Drive. °? Use heavy machinery. °? Drink alcohol. °? Take sleeping pills or medicines that cause drowsiness. °? Make important decisions or sign legal documents. °? Take care of children on your own. °· Rest. °Eating and drinking °· Follow the diet recommended by your health care provider. °· If you vomit: °? Drink water, juice, or soup when you can drink without vomiting. °? Make sure you have little or no nausea before eating solid foods. °General instructions °· Have a responsible adult stay with you until you are awake and alert. °· Take over-the-counter and prescription medicines only as told by your health care provider. °· If you smoke, do not smoke without supervision. °· Keep all follow-up visits as told by your health care provider. This is important. °Contact a health care provider if: °· You keep feeling nauseous or you keep vomiting. °· You feel light-headed. °· You develop a rash. °· You have a fever. °Get help right away if: °· You have trouble breathing. °This information is not intended to replace advice given to you by your health care provider. Make sure you discuss any questions you have  with your health care provider. °Document Released: 01/23/2013 Document Revised: 09/07/2015 Document Reviewed: 07/25/2015 °Elsevier Interactive Patient Education © 2018 [employer] ° ° °Needle Biopsy, Care After °These instructions give you information about caring for yourself after your procedure. Your doctor may also give you more specific instructions. Call your doctor if you have any problems or questions after your procedure. °Follow these instructions at home: °· Rest as told by your doctor. °· Take medicines only as told by your doctor. °· There are many different ways to close and cover the biopsy site, including stitches (sutures), skin glue, and adhesive strips. Follow instructions from your doctor about: °? How to take care of your biopsy site. °? When and how you should change your bandage (dressing).  You may remove your dressing tomorrow.  You may shower tomorrow. °? When you should remove your dressing. °? Removing whatever was used to close your biopsy site. °· Check your biopsy site every day for signs of infection. Watch for: °? Redness, swelling, or pain. °? Fluid, blood, or pus. °Contact a doctor if: °· You have a fever. °· You have redness, swelling, or pain at the biopsy site, and it lasts longer than a few days. °· You have fluid, blood, or pus coming from the biopsy site. °· You feel sick to your stomach (nauseous). °· You throw up (vomit). °Get help right away if: °· You are short of breath. °· You have trouble breathing. °· Your chest hurts. °· You feel dizzy or you pass out (faint). °· You have bleeding that does   not stop with pressure or a bandage. °· You cough up blood. °· Your belly (abdomen) hurts. °This information is not intended to replace advice given to you by your health care provider. Make sure you discuss any questions you have with your health care provider. °Document Released: 03/17/2008 Document Revised: 09/10/2015 Document Reviewed: 03/31/2014 °Elsevier Interactive  Patient Education © 2018 [employer] ° °

## 2018-02-01 NOTE — H&P (Signed)
Chief Complaint: Patient was seen in consultation today for axillary lymphadenopathy.  Referring Physician(s): Cross,Melissa D  Supervising Physician: Markus Daft  Patient Status: [facility] - Out-pt  History of Present Illness: Krista Johnson is a 67 y.o. female with a past medical history of hypertension, hyperlipidemia, colon polyps, CKD, endometrial cancer, diabetes mellitus, hyperparathyroidism, and arthritis. She was diagnosed with endometrial cancer after noting postmenopausal bleeding. She had an endometrial biopsy by Dr. Leo Grosser which revealed poorly differentiated endometrial cancer. Her cancer has been managed by Dr. Denman George and [name], NP. She underwent a robotic-assisted laparoscopic total hysterectomy with bilateral salpingoophorectomy in the OR 01/23/2018 with Dr. Denman George. During workup, she was noted to have bilateral axillary lymphadenopathy on PET scan 01/09/2018 that could be concerning for metastatic disease.  NM PET 01/09/2018: 1. Moderate hypermetabolism corresponding to enlarging axillary node since 12/22/2017. Highly suspicious for an atypical distribution of metastatic disease. 2. No hypermetabolism to suggest hepatic or T12 osseous metastasis. 3. Hypermetabolic endometrial primary.  IR requested by [name], NP for possible image-guided axillary lymph node biopsy to evaluate for metastatic disease. Patient awake and alert sitting in bed with no complaints at this time. Denies fever, chills, chest pain, dyspnea, abdominal pain, dizziness, or headache.   Past Medical History:  Diagnosis Date  . Arthritis    right knee--- last cortisone injection 04/ 2019  . CKD (chronic kidney disease)   . Diabetes mellitus without complication [facility])    endocrinologist-  dr Loanne Drilling  . Endometrial cancer ([place])   . History of colon polyps   . Hyperlipidemia   . Hyperparathyroidism ([place])   . Hypertension     Past Surgical History:  Procedure Laterality Date  .  COLONOSCOPY  last one 12-25-2017  . ROBOTIC ASSISTED TOTAL HYSTERECTOMY WITH BILATERAL SALPINGO OOPHERECTOMY N/A 01/23/2018   Procedure: XI ROBOTIC ASSISTED TOTAL HYSTERECTOMY WITH BILATERAL SALPINGO OOPHORECTOMY;  Surgeon: Everitt Amber, MD;  Location: [facility] ORS;  Service: Gynecology;  Laterality: N/A;  . SENTINEL NODE BIOPSY N/A 01/23/2018   Procedure: SENTINEL NODE BIOPSY;  Surgeon: Everitt Amber, MD;  Location: [facility] ORS;  Service: Gynecology;  Laterality: N/A;    Allergies: Sulfa antibiotics and Sulfamethoxazole  Medications: Prior to Admission medications   Medication Sig Start Date End Date Taking? Authorizing Provider  blood glucose meter kit and supplies KIT Dispense based on patient and insurance preference. Use up to four times daily as directed. (FOR ICD-9 250.00, 250.01). 12/12/17   Volanda Napoleon, PA-C  Insulin Isophane & Regular Human (NOVOLIN 70/30 FLEXPEN) (70-30) 100 UNIT/ML PEN Inject 25 Units into the skin daily with breakfast. and pen needles 1/day 01/05/18   Renato Shin, MD  ketoconazole (NIZORAL) 2 % cream Apply to web space twice a day for 1 week then daily for 1 month Patient taking differently: Apply 1 application topically daily as needed for irritation. Apply to web space twice a day for 1 week then daily for 1 month 02/14/15   Leandrew Koyanagi, MD  lisinopril-hydrochlorothiazide (PRINZIDE,ZESTORETIC) 20-12.5 MG tablet Take 1 tablet by mouth daily. Patient taking differently: Take 1 tablet by mouth every morning.  12/26/17   Renato Shin, MD  [facility] DELICA LANCETS 11H MISC 3 (three) times daily. as directed 12/14/17   [provider]  [facility] VERIO test strip 3 (three) times daily. as directed 12/13/17   [provider]  senna-docusate (SENOKOT-S) 8.6-50 MG tablet Take 1 tablet by mouth at bedtime as needed for mild constipation or moderate constipation. 01/24/18  Cross, Melissa D, NP  simvastatin (ZOCOR) 10 MG tablet Take 10 mg by mouth at bedtime.   07/09/17   [provider]  traMADol (ULTRAM) 50 MG tablet Take 1 tablet (50 mg total) by mouth every 6 (six) hours as needed for severe pain. 01/24/18   [name] D, NP  Vitamin D, Ergocalciferol, 2000 units CAPS Take 2,000 Units by mouth daily.  10/23/17   [provider]     Family History  Problem Relation Age of Onset  . Diabetes Mother   . Hypertension Mother   . Diabetes Sister   . Hypertension Sister   . Diabetes Maternal Uncle   . Colon cancer Paternal Aunt   . Diabetes Paternal Aunt   . Stomach cancer Neg Hx   . Rectal cancer Neg Hx   . Esophageal cancer Neg Hx   . Colon polyps Neg Hx     Social History   Socioeconomic History  . Marital status: Single    Spouse name: Not on file  . Number of children: Not on file  . Years of education: Not on file  . Highest education level: Not on file  Occupational History  . Not on file  Social Needs  . Financial resource strain: Not on file  . Food insecurity:    Worry: Not on file    Inability: Not on file  . Transportation needs:    Medical: Not on file    Non-medical: Not on file  Tobacco Use  . Smoking status: Never Smoker  . Smokeless tobacco: Never Used  Substance and Sexual Activity  . Alcohol use: Not Currently    Alcohol/week: 0.0 standard drinks    Frequency: Never  . Drug use: Never  . Sexual activity: Not on file  Lifestyle  . Physical activity:    Days per week: Not on file    Minutes per session: Not on file  . Stress: Not on file  Relationships  . Social connections:    Talks on phone: Not on file    Gets together: Not on file    Attends religious service: Not on file    Active member of club or organization: Not on file    Attends meetings of clubs or organizations: Not on file    Relationship status: Not on file  Other Topics Concern  . Not on file  Social History Narrative  . Not on file     Review of Systems: A 12 point ROS discussed and pertinent positives are  indicated in the HPI above.  All other systems are negative.  Review of Systems  Constitutional: Negative for chills and fever.  Respiratory: Negative for shortness of breath and wheezing.   Cardiovascular: Negative for chest pain and palpitations.  Gastrointestinal: Negative for abdominal pain.  Neurological: Negative for dizziness and headaches.  Psychiatric/Behavioral: Negative for behavioral problems and confusion.    Vital Signs: BP 116/83   Pulse 94   Temp 98.4 F (36.9 C) (Oral)   SpO2 100%   Physical Exam  Constitutional: She is oriented to person, place, and time. She appears well-developed and well-nourished. No distress.  Cardiovascular: Normal rate, regular rhythm and normal heart sounds.  No murmur heard. Pulmonary/Chest: Effort normal and breath sounds normal. No respiratory distress. She has no wheezes.  Neurological: She is alert and oriented to person, place, and time.  Skin: Skin is warm and dry.  Psychiatric: She has a normal mood and affect. Her behavior is normal. Judgment  and thought content normal.  Nursing note and vitals reviewed.    MD Evaluation Airway: WNL Heart: WNL Abdomen: WNL Chest/ Lungs: WNL ASA  Classification: 2 Mallampati/Airway Score: One   Imaging: Nm Pet Image Initial (pi) Skull Base To Thigh  Result Date: 01/09/2018 CLINICAL DATA:  Initial treatment strategy for high-grade endometrial cancer with concern for metastatic disease. Staging. EXAM: EXAM NUCLEAR MEDICINE PET SKULL BASE TO THIGH TECHNIQUE: 9.1 mCi F-18 FDG was injected intravenously. Full-ring PET imaging was performed from the skull base to thigh after the radiotracer. CT data was obtained and used for attenuation correction and anatomic localization. Fasting blood glucose: 169 mg/dl COMPARISON:  Chest abdomen and pelvic CTs of 12/22/2017. FINDINGS: Mediastinal blood pool activity: SUV max 2.6 NECK: No areas of abnormal hypermetabolism. Incidental CT findings: No cervical  adenopathy. CHEST: Hypermetabolism to correspond to the previously described axillary lymph nodes. 1.5 cm right axillary node measures a S.U.V. max of 8.4 on image 46/4. 1.2 cm on the prior CT. A left axillary node measures 1.2 cm and a S.U.V. max of 11.6 on image 59/4. 8 mm on the prior CT. No other thoracic hypermetabolism. Incidental CT findings: Chest findings deferred to prior diagnostic CT. Lad coronary artery calcification. Nodule along the right minor fissure is detailed on prior CT. ABDOMEN/PELVIS: Mildly heterogeneous hepatic activity, felt to be within normal variation. No correlate for the peripheral areas of heterogeneous enhancement. No abdominopelvic nodal hypermetabolism. The central uterine mass is hypermetabolic, including at a S.U.V. max of 13.7. Incidental CT findings: Deferred to recent diagnostic CT. Abdominal aortic atherosclerosis. Right renal cyst. Trace cul-de-sac fluid. SKELETON: No hypermetabolism to correspond to the T12 mixed sclerotic and lucent lesion. Incidental CT findings: none IMPRESSION: 1. Moderate hypermetabolism corresponding to enlarging axillary node since 12/22/2017. Highly suspicious for an atypical distribution of metastatic disease. 2. No hypermetabolism to suggest hepatic or T12 osseous metastasis. 3. Hypermetabolic endometrial primary. Electronically Signed   By: Abigail Miyamoto M.D.   On: 01/09/2018 14:50    Labs:  CBC: Recent Labs    12/12/17 1759 12/12/17 2249 01/18/18 0940 01/24/18 0435  WBC 5.8  --  4.1 7.9  HGB 12.2 11.9* 10.9* 9.7*  HCT 37.0 35.0* 34.4* 31.4*  PLT 287  --  329 250    COAGS: No results for input(s): INR, APTT in the last 8760 hours.  BMP: Recent Labs    12/12/17 1537  12/12/17 1759 12/12/17 2249 12/22/17 1505 01/18/18 0940 01/22/18 1109 01/24/18 0435  NA 132*  --  133* 138  --  143 139 141  K 4.0  --  3.7 3.0*  --  4.2 4.2 4.1  CL 93*  --  94* 102  --  110 105 109  CO2 22  --  22  --   --  _0 GLUCOSE 477*   --  454* 223*  --  199* 161* 164*  BUN 44*  --  44* 34*  --  21 21 25*  CALCIUM 11.0*  --  10.6*  --   --  10.0 10.4  10.4 9.8  CREATININE 2.30*   < > 1.83* 1.50* 1.90* 1.41* 1.17 1.41*  GFRNONAA 21*  --  27*  --   --  38*  --  38*  GFRAA 24*  --  32*  --   --  44*  --  44*   < > = values in this interval not displayed.    LIVER FUNCTION TESTS: Recent Labs  01/18/18 0940  BILITOT 0.5  AST 18  ALT 15  ALKPHOS 59  PROT 6.5  ALBUMIN 3.6    TUMOR MARKERS: No results for input(s): AFPTM, CEA, CA199, CHROMGRNA in the last 8760 hours.  Assessment and Plan:  Axillary lymphadenopathy. Plan for image-guided axillary lymph node biopsy today with Dr. Anselm Pancoast. Patient is NPO. Afebrile and WBCs WNL. She does not take blood thinners. INR 0/90 seconds this AM.  Risks and benefits discussed with the patient including, but not limited to bleeding, infection, damage to adjacent structures or low yield requiring additional tests. All of the patient's questions were answered, patient is agreeable to proceed. Consent signed and in chart.   Thank you for this interesting consult.  I greatly enjoyed meeting JULIANNY MILSTEIN and look forward to participating in their care.  A copy of this report was sent to the requesting provider on this date.  Electronically Signed: Earley Abide, PA-C 02/01/2018, 11:47 AM   I spent a total of 30 Minutes in face to face in clinical consultation, greater than 50% of which was counseling/coordinating care for axillary lymphadenopathy.

## 2018-02-01 NOTE — Telephone Encounter (Signed)
faxed

## 2018-02-01 NOTE — Procedures (Signed)
US guided core biopsy of right axillary lymph node.  6 cores.  Minimal blood loss and no immediate complication.

## 2018-02-05 ENCOUNTER — Encounter: Payer: Self-pay | Admitting: Oncology

## 2018-02-05 ENCOUNTER — Encounter: Payer: Self-pay | Admitting: Radiation Oncology

## 2018-02-05 ENCOUNTER — Ambulatory Visit
Admission: RE | Admit: 2018-02-05 | Discharge: 2018-02-05 | Disposition: A | Discharge status: Home / Self Care | Payer: Medicare Other | Source: Ambulatory Visit | Attending: Radiation Oncology | Admitting: Radiation Oncology

## 2018-02-05 ENCOUNTER — Other Ambulatory Visit: Payer: Self-pay

## 2018-02-05 VITALS — BP 135/77 | HR 96 | Temp 98.1°F | Resp 20 | Ht 66.0 in | Wt 190.6 lb

## 2018-02-05 DIAGNOSIS — C541 Malignant neoplasm of endometrium: Secondary | ICD-10-CM

## 2018-02-05 DIAGNOSIS — N189 Chronic kidney disease, unspecified: Secondary | ICD-10-CM | POA: Diagnosis not present

## 2018-02-05 DIAGNOSIS — M1990 Unspecified osteoarthritis, unspecified site: Secondary | ICD-10-CM | POA: Diagnosis not present

## 2018-02-05 DIAGNOSIS — Z794 Long term (current) use of insulin: Secondary | ICD-10-CM | POA: Diagnosis not present

## 2018-02-05 DIAGNOSIS — Z90710 Acquired absence of both cervix and uterus: Secondary | ICD-10-CM | POA: Diagnosis not present

## 2018-02-05 DIAGNOSIS — E785 Hyperlipidemia, unspecified: Secondary | ICD-10-CM | POA: Diagnosis not present

## 2018-02-05 DIAGNOSIS — Z79899 Other long term (current) drug therapy: Secondary | ICD-10-CM | POA: Diagnosis not present

## 2018-02-05 DIAGNOSIS — Z8542 Personal history of malignant neoplasm of other parts of uterus: Secondary | ICD-10-CM | POA: Insufficient documentation

## 2018-02-05 DIAGNOSIS — I129 Hypertensive chronic kidney disease with stage 1 through stage 4 chronic kidney disease, or unspecified chronic kidney disease: Secondary | ICD-10-CM | POA: Insufficient documentation

## 2018-02-05 DIAGNOSIS — Z882 Allergy status to sulfonamides status: Secondary | ICD-10-CM | POA: Diagnosis not present

## 2018-02-05 DIAGNOSIS — R590 Localized enlarged lymph nodes: Secondary | ICD-10-CM | POA: Diagnosis not present

## 2018-02-05 DIAGNOSIS — Z90711 Acquired absence of uterus with remaining cervical stump: Secondary | ICD-10-CM | POA: Diagnosis not present

## 2018-02-05 NOTE — Progress Notes (Signed)
Radiation Oncology         [phone] ________________________________  Initial Outpatient Consultation  Name: Krista Johnson MRN: [id number]  Date: 02/05/2018  DOB: 1950/11/19  CC:Jonathon Jordan, MD  Everitt Amber, MD   REFERRING PHYSICIAN: Everitt Amber, MD  DIAGNOSIS: Pathologic stage: pT3a, pN0. Endometrial cancer. Figo Stage: IIIA   HISTORY OF PRESENT ILLNESS::Krista Johnson is a 67 y.o. female who is presenting to the office today for evaluation of endometrial cancer. She initially presented to Dr. Leo Grosser with postmenopausal bleeding. She had some spotting that she noticed in the spring/summer prior to meeting with Dr. Leo Grosser. She had a pap smear performed by Dr. Leo Grosser with abnormalities noted. Currently she denies pain and bleeding. She denies LE swelling but states she has some soreness in right leg and right shoulder. She denies nausea, vomiting and any other pain.  Overall she stated that she feels great. She has recently upped her water intake to 64 oz per day after starting insulin injections in September.   She underwent total hysterectomy and sentinel node biopsy on 01/23/18. The sentinel node biopsy showed: Lymph node, sentinel, biopsy, left external iliac with no carcinoma identified in one lymph node (0/1).  Lymph nodes, regional resection, right para aortic with no carcinoma identified in four lymph nodes (0/4). Lymph nodes, regional resection, right pelvic with no carcinoma identified in eight lymph nodes (0/8). Cul-de-sac biopsy which showed metastatic carcinoma. Uterus +/- tubes/ovaries, neoplastic, cervix, bilateral fallopian tubes and ovaries. 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 Cervix with benign nabothian cysts, no carcinoma identified. Bilateral ovaries with metastatic carcinoma present on ovarian surface. Bilateral fallopian tubes with intraluminal  carcinoma.  Prior to the hysterectomy, she underwent an endometrial biopsy performed by Dr. Leo Grosser showing poorly differentiated endometrial carcinoma and negative endocervical curettage.     PREVIOUS RADIATION THERAPY: No  PAST MEDICAL HISTORY:  has a past medical history of Arthritis, CKD (chronic kidney disease), Diabetes mellitus without complication ([place]), Endometrial cancer ([place]), History of colon polyps, Hyperlipidemia, Hyperparathyroidism ([place]), and Hypertension.    PAST SURGICAL HISTORY: Past Surgical History:  Procedure Laterality Date  . COLONOSCOPY  last one 12-25-2017  . ROBOTIC ASSISTED TOTAL HYSTERECTOMY WITH BILATERAL SALPINGO OOPHERECTOMY N/A 01/23/2018   Procedure: XI ROBOTIC ASSISTED TOTAL HYSTERECTOMY WITH BILATERAL SALPINGO OOPHORECTOMY;  Surgeon: Everitt Amber, MD;  Location: [facility];  Service: Gynecology;  Laterality: N/A;  . SENTINEL NODE BIOPSY N/A 01/23/2018   Procedure: SENTINEL NODE BIOPSY;  Surgeon: Everitt Amber, MD;  Location: [facility];  Service: Gynecology;  Laterality: N/A;    FAMILY HISTORY: family history includes Colon cancer in her paternal aunt; Diabetes in her maternal uncle, mother, paternal aunt, and sister; Hypertension in her mother and sister.  SOCIAL HISTORY:  reports that she has never smoked. She has never used smokeless tobacco. She reports that she drank alcohol. She reports that she does not use drugs.  ALLERGIES: Sulfa antibiotics and Sulfamethoxazole  MEDICATIONS:  Current Outpatient Medications  Medication Sig Dispense Refill  . acetaminophen (TYLENOL) 500 MG tablet Take 500 mg by mouth every 6 (six) hours as needed.    . blood glucose meter kit and supplies KIT Dispense based on patient and insurance preference. Use up to four times daily as directed. (FOR ICD-9 250.00, 250.01). 1 each 0  . Insulin Isophane & Regular Human (NOVOLIN 70/30 FLEXPEN) (70-30) 100 UNIT/ML PEN Inject 25 Units into the skin daily with breakfast. and pen  needles  1/day 15 mL 11  . ketoconazole (NIZORAL) 2 % cream Apply to web space twice a day for 1 week then daily for 1 month (Patient taking differently: Apply 1 application topically daily as needed for irritation. Apply to web space twice a day for 1 week then daily for 1 month) 15 g 0  . lisinopril-hydrochlorothiazide (PRINZIDE,ZESTORETIC) 20-12.5 MG tablet Take 1 tablet by mouth daily. (Patient taking differently: Take 1 tablet by mouth every morning. ) 30 tablet 11  . ONETOUCH DELICA LANCETS 86V MISC 3 (three) times daily. as directed  5  . ONETOUCH VERIO test strip 3 (three) times daily. as directed  5  . senna-docusate (SENOKOT-S) 8.6-50 MG tablet Take 1 tablet by mouth at bedtime as needed for mild constipation or moderate constipation. 20 tablet 0  . simvastatin (ZOCOR) 10 MG tablet Take 10 mg by mouth at bedtime.     . Vitamin D, Ergocalciferol, 2000 units CAPS Take 2,000 Units by mouth daily.   0  . traMADol (ULTRAM) 50 MG tablet Take 1 tablet (50 mg total) by mouth every 6 (six) hours as needed for severe pain. (Patient not taking: Reported on 02/05/2018) 15 tablet 0   No current facility-administered medications for this encounter.     REVIEW OF SYSTEMS:  A 10+ POINT REVIEW OF SYSTEMS WAS OBTAINED including neurology, dermatology, psychiatry, cardiac, respiratory, lymph, extremities, GI, GU, musculoskeletal, constitutional, reproductive, HEENT. All pertinent positives are noted in the HPI. All others are negative.    PHYSICAL EXAM:  height is _0  (1.676 m) and weight is 190 lb 9.6 oz (86.5 kg). Her oral temperature is 98.1 F (36.7 C). Her blood pressure is 135/77 and her pulse is 96. Her respiration is 20 and oxygen saturation is 98%.   General: Alert and oriented, in no acute distress HEENT: Head is normocephalic. Extraocular movements are intact. Oropharynx is clear. Neck: Neck is supple, no palpable cervical or supraclavicular lymphadenopathy. Heart: Regular in rate and rhythm with no  murmurs, rubs, or gallops. Chest: Clear to auscultation bilaterally, with no rhonchi, wheezes, or rales. Abdomen: Soft, nontender, nondistended, with no rigidity or guarding. Extremities: No cyanosis or edema. Lymphatics: see Neck Exam Skin: No concerning lesions. Musculoskeletal: symmetric strength and muscle tone throughout. Neurologic: Cranial nerves II through XII are grossly intact. No obvious focalities. Speech is fluent. Coordination is intact. Psychiatric: Judgment and insight are intact. Affect is appropriate. Pelvic exam deferred today in light of recent surgery.  ECOG = 1  0 - Asymptomatic (Fully active, able to carry on all predisease activities without restriction)  1 - Symptomatic but completely ambulatory (Restricted in physically strenuous activity but ambulatory and able to carry out work of a light or sedentary nature. For example, light housework, office work)  2 - Symptomatic, <50% in bed during the day (Ambulatory and capable of all self care but unable to carry out any work activities. Up and about more than 50% of waking hours)  3 - Symptomatic, >50% in bed, but not bedbound (Capable of only limited self-care, confined to bed or chair 50% or more of waking hours)  4 - Bedbound (Completely disabled. Cannot carry on any self-care. Totally confined to bed or chair)  5 - Death   Eustace Pen MM, Creech RH, Tormey DC, et al. [phone]). "Toxicity and response criteria of the [facility] Group". [place] Oncol. 5 (6): 649-55  LABORATORY DATA:  Lab Results  Component Value Date   WBC 5.6 02/01/2018  HGB 11.6 (L) 02/01/2018   HCT 38.2 02/01/2018   MCV 89.7 02/01/2018   PLT 372 02/01/2018   Lab Results  Component Value Date   NA 141 01/24/2018   K 4.1 01/24/2018   CL 109 01/24/2018   CO2 23 01/24/2018   GLUCOSE 164 (H) 01/24/2018   CREATININE 1.41 (H) 01/24/2018   CALCIUM 9.8 01/24/2018      RADIOGRAPHY: Nm Pet Image Initial (pi) Skull Base To  Thigh  Result Date: 01/09/2018 CLINICAL DATA:  Initial treatment strategy for high-grade endometrial cancer with concern for metastatic disease. Staging. EXAM: EXAM NUCLEAR MEDICINE PET SKULL BASE TO THIGH TECHNIQUE: 9.1 mCi F-18 FDG was injected intravenously. Full-ring PET imaging was performed from the skull base to thigh after the radiotracer. CT data was obtained and used for attenuation correction and anatomic localization. Fasting blood glucose: 169 mg/dl COMPARISON:  Chest abdomen and pelvic CTs of 12/22/2017. FINDINGS: Mediastinal blood pool activity: SUV max 2.6 NECK: No areas of abnormal hypermetabolism. Incidental CT findings: No cervical adenopathy. CHEST: Hypermetabolism to correspond to the previously described axillary lymph nodes. 1.5 cm right axillary node measures a S.U.V. max of 8.4 on image 46/4. 1.2 cm on the prior CT. A left axillary node measures 1.2 cm and a S.U.V. max of 11.6 on image 59/4. 8 mm on the prior CT. No other thoracic hypermetabolism. Incidental CT findings: Chest findings deferred to prior diagnostic CT. Lad coronary artery calcification. Nodule along the right minor fissure is detailed on prior CT. ABDOMEN/PELVIS: Mildly heterogeneous hepatic activity, felt to be within normal variation. No correlate for the peripheral areas of heterogeneous enhancement. No abdominopelvic nodal hypermetabolism. The central uterine mass is hypermetabolic, including at a S.U.V. max of 13.7. Incidental CT findings: Deferred to recent diagnostic CT. Abdominal aortic atherosclerosis. Right renal cyst. Trace cul-de-sac fluid. SKELETON: No hypermetabolism to correspond to the T12 mixed sclerotic and lucent lesion. Incidental CT findings: none IMPRESSION: 1. Moderate hypermetabolism corresponding to enlarging axillary node since 12/22/2017. Highly suspicious for an atypical distribution of metastatic disease. 2. No hypermetabolism to suggest hepatic or T12 osseous metastasis. 3. Hypermetabolic  endometrial primary. Electronically Signed   By: Abigail Miyamoto M.D.   On: 01/09/2018 14:50   [place] Core Biopsy (soft Tissue)  Result Date: 02/01/2018 INDICATION: 67 year old with endometrial cancer and suspicious bilateral axillary lymphadenopathy. EXAM: RIGHT AXILLARY LYMPH NODE BIOPSY WITH ULTRASOUND GUIDANCE MEDICATIONS: None. ANESTHESIA/SEDATION: Moderate (conscious) sedation was employed during this procedure. A total of Versed 2.0 mg and Fentanyl 100 mcg was administered intravenously. Moderate Sedation Time: 22 minutes. The patient's level of consciousness and vital signs were monitored continuously by radiology nursing throughout the procedure under my direct supervision. FLUOROSCOPY TIME:  None COMPLICATIONS: None immediate. PROCEDURE: Informed written consent was obtained from the patient after a thorough discussion of the procedural risks, benefits and alternatives. All questions were addressed. A timeout was performed prior to the initiation of the procedure. The axillary regions were evaluated with ultrasound. The right axillary lymph node was selected for biopsy. The right axillary region was prepped with chlorhexidine and sterile field was created. Skin and soft tissues were anesthetized with 1% lidocaine. 68 gauge core biopsy needle was directed into the right axillary lymph node with real-time ultrasound guidance. A total of 6 core biopsies were obtained. Specimens placed in saline. Bandage placed over the puncture site. FINDINGS: Round hypoechoic lymph node in the right axilla measuring 2.3 x 2.2 x 2.4 cm. No significant bleeding or hematoma formation following the core biopsies.  Round hypoechoic left axillary lymph node measures 1.7 x 1.4 x 1.9 cm. IMPRESSION: Ultrasound-guided core biopsies of a suspicious right axillary lymph node. Electronically Signed   By: Markus Daft M.D.   On: 02/01/2018 15:55      IMPRESSION: Pathologic stage: pT3a, pN0. Endometrial cancer. Figo Stage: IIIA. Results  from the patient's right axillary node biopsy are pending at this time.  Patient will meet with Dr. Alvy Bimler later this week for a discussion of adjuvant chemo.  given the pathologic findings I would agree with Dr. Serita Grit recommendations for vaginal cuff radiation treatments.  Today, I talked to the patient  about the findings and work-up thus far.  We discussed the natural history of endometrial cancer.  and general treatment, highlighting the role of radiotherapy in particular brachytherapy in the management.  We discussed the available radiation techniques, and focused on the details of logistics and delivery.  We reviewed the anticipated acute and late sequelae associated with radiation in this setting.  The patient was encouraged to ask questions that I answered to the best of my ability.  A patient consent form was discussed and signed.  We retained a copy for our records.  The patient would like to proceed with radiation and will be scheduled for CT simulation.   PLAN: Patient will proceed with adjuvant chemotherapy. We will initiate radiation in her second or third cycle of chemotherapy. Anticipate 5 high-dose rate treatments directed at the vaginal cuff. If the patient's axillary lymph node biopsy is positive we may have to rethink the patient's overall treatment plan. ------------------------------------------------  Blair Promise, PhD, MD      This document serves as a record of services personally performed by Gery Pray, MD. It was created on his behalf by Mary-Margaret Loma Messing, a trained medical scribe. The creation of this record is based on the scribe's personal observations and the provider's statements to them. This document has been checked and approved by the attending provider.

## 2018-02-08 ENCOUNTER — Telehealth: Payer: Self-pay | Admitting: Hematology and Oncology

## 2018-02-08 ENCOUNTER — Telehealth: Payer: Self-pay

## 2018-02-08 ENCOUNTER — Encounter: Payer: Self-pay | Admitting: Gynecologic Oncology

## 2018-02-08 ENCOUNTER — Telehealth: Payer: Self-pay | Admitting: Oncology

## 2018-02-08 ENCOUNTER — Inpatient Hospital Stay: Payer: Medicare Other | Attending: Gynecology | Admitting: Gynecologic Oncology

## 2018-02-08 ENCOUNTER — Encounter: Payer: Self-pay | Admitting: Hematology and Oncology

## 2018-02-08 ENCOUNTER — Inpatient Hospital Stay (HOSPITAL_BASED_OUTPATIENT_CLINIC_OR_DEPARTMENT_OTHER): Payer: Medicare Other | Admitting: Hematology and Oncology

## 2018-02-08 VITALS — BP 138/88 | HR 106 | Temp 97.4°F | Resp 20 | Ht 66.0 in | Wt 189.0 lb

## 2018-02-08 DIAGNOSIS — Z90722 Acquired absence of ovaries, bilateral: Secondary | ICD-10-CM

## 2018-02-08 DIAGNOSIS — E1165 Type 2 diabetes mellitus with hyperglycemia: Secondary | ICD-10-CM

## 2018-02-08 DIAGNOSIS — N183 Chronic kidney disease, stage 3 unspecified: Secondary | ICD-10-CM

## 2018-02-08 DIAGNOSIS — Z794 Long term (current) use of insulin: Secondary | ICD-10-CM

## 2018-02-08 DIAGNOSIS — C541 Malignant neoplasm of endometrium: Secondary | ICD-10-CM | POA: Diagnosis not present

## 2018-02-08 DIAGNOSIS — C786 Secondary malignant neoplasm of retroperitoneum and peritoneum: Secondary | ICD-10-CM

## 2018-02-08 DIAGNOSIS — Z90710 Acquired absence of both cervix and uterus: Secondary | ICD-10-CM

## 2018-02-08 DIAGNOSIS — C773 Secondary and unspecified malignant neoplasm of axilla and upper limb lymph nodes: Secondary | ICD-10-CM | POA: Diagnosis not present

## 2018-02-08 DIAGNOSIS — Z7189 Other specified counseling: Secondary | ICD-10-CM

## 2018-02-08 DIAGNOSIS — IMO0001 Reserved for inherently not codable concepts without codable children: Secondary | ICD-10-CM

## 2018-02-08 MED ORDER — ONDANSETRON HCL 8 MG PO TABS: 8.0000 mg | ORAL_TABLET | Freq: Three times a day (TID) | ORAL | 1 refills | Status: DC | PRN

## 2018-02-08 MED ORDER — LIDOCAINE-PRILOCAINE 2.5-2.5 % EX CREA: TOPICAL_CREAM | CUTANEOUS | 3 refills | Status: DC

## 2018-02-08 MED ORDER — PROCHLORPERAZINE MALEATE 10 MG PO TABS: 10.0000 mg | ORAL_TABLET | Freq: Four times a day (QID) | ORAL | 1 refills | Status: DC | PRN

## 2018-02-08 MED ORDER — DEXAMETHASONE 4 MG PO TABS: ORAL_TABLET | ORAL | 0 refills | Status: DC

## 2018-02-08 NOTE — Telephone Encounter (Signed)
10/24.  Adjusted a couple of appts.  Called patient and mailed calendar.

## 2018-02-08 NOTE — Telephone Encounter (Signed)
Called per Dr. Alvy Bimler. Lab appt canceled on 11/5, labs to be drawn in IR with port placement. Injection appt canceled on 11/8. She verbalized understanding.

## 2018-02-08 NOTE — Telephone Encounter (Signed)
Also added on ER/PR testing.

## 2018-02-08 NOTE — Patient Instructions (Signed)
Please follow-up with your medical oncologist, Dr Alvy Bimler as planned. You will receive 6 doses of chemotherapy.  Dr Denman George will see you after completing chemotherapy and radiation.

## 2018-02-08 NOTE — Telephone Encounter (Signed)
Gave patient avs and calendar.  Decoupled lab appt for 12/2 infusion due to availability.  Added uden

## 2018-02-08 NOTE — Patient Instructions (Signed)
Carboplatin injection What is this medicine? CARBOPLATIN (KAR boe pla tin) is a chemotherapy drug. It targets fast dividing cells, like cancer cells, and causes these cells to die. This medicine is used to treat ovarian cancer and many other cancers. This medicine may be used for other purposes; ask your health care provider or pharmacist if you have questions. COMMON BRAND NAME(S): Paraplatin What should I tell my health care provider before I take this medicine? They need to know if you have any of these conditions: -blood disorders -hearing problems -kidney disease -recent or ongoing radiation therapy -an unusual or allergic reaction to carboplatin, cisplatin, other chemotherapy, other medicines, foods, dyes, or preservatives -pregnant or trying to get pregnant -breast-feeding How should I use this medicine? This drug is usually given as an infusion into a vein. It is administered in a hospital or clinic by a specially trained health care professional. Talk to your pediatrician regarding the use of this medicine in children. Special care may be needed. Overdosage: If you think you have taken too much of this medicine contact a poison control center or emergency room at once. NOTE: This medicine is only for you. Do not share this medicine with others. What if I miss a dose? It is important not to miss a dose. Call your doctor or health care professional if you are unable to keep an appointment. What may interact with this medicine? -medicines for seizures -medicines to increase blood counts like filgrastim, pegfilgrastim, sargramostim -some antibiotics like amikacin, gentamicin, neomycin, streptomycin, tobramycin -vaccines Talk to your doctor or health care professional before taking any of these medicines: -acetaminophen -aspirin -ibuprofen -ketoprofen -naproxen This list may not describe all possible interactions. Give your health care provider a list of all the medicines, herbs,  non-prescription drugs, or dietary supplements you use. Also tell them if you smoke, drink alcohol, or use illegal drugs. Some items may interact with your medicine. What should I watch for while using this medicine? Your condition will be monitored carefully while you are receiving this medicine. You will need important blood work done while you are taking this medicine. This drug may make you feel generally unwell. This is not uncommon, as chemotherapy can affect healthy cells as well as cancer cells. Report any side effects. Continue your course of treatment even though you feel ill unless your doctor tells you to stop. In some cases, you may be given additional medicines to help with side effects. Follow all directions for their use. Call your doctor or health care professional for advice if you get a fever, chills or sore throat, or other symptoms of a cold or flu. Do not treat yourself. This drug decreases your body's ability to fight infections. Try to avoid being around people who are sick. This medicine may increase your risk to bruise or bleed. Call your doctor or health care professional if you notice any unusual bleeding. Be careful brushing and flossing your teeth or using a toothpick because you may get an infection or bleed more easily. If you have any dental work done, tell your dentist you are receiving this medicine. Avoid taking products that contain aspirin, acetaminophen, ibuprofen, naproxen, or ketoprofen unless instructed by your doctor. These medicines may hide a fever. Do not become pregnant while taking this medicine. Women should inform their doctor if they wish to become pregnant or think they might be pregnant. There is a potential for serious side effects to an unborn child. Talk to your health care professional or   pharmacist for more information. Do not breast-feed an infant while taking this medicine. What side effects may I notice from receiving this medicine? Side effects  that you should report to your doctor or health care professional as soon as possible: -allergic reactions like skin rash, itching or hives, swelling of the face, lips, or tongue -signs of infection - fever or chills, cough, sore throat, pain or difficulty passing urine -signs of decreased platelets or bleeding - bruising, pinpoint red spots on the skin, black, tarry stools, nosebleeds -signs of decreased red blood cells - unusually weak or tired, fainting spells, lightheadedness -breathing problems -changes in hearing -changes in vision -chest pain -high blood pressure -low blood counts - This drug may decrease the number of white blood cells, red blood cells and platelets. You may be at increased risk for infections and bleeding. -nausea and vomiting -pain, swelling, redness or irritation at the injection site -pain, tingling, numbness in the hands or feet -problems with balance, talking, walking -trouble passing urine or change in the amount of urine Side effects that usually do not require medical attention (report to your doctor or health care professional if they continue or are bothersome): -hair loss -loss of appetite -metallic taste in the mouth or changes in taste This list may not describe all possible side effects. Call your doctor for medical advice about side effects. You may report side effects to FDA at 1-800-FDA-1088. Where should I keep my medicine? This drug is given in a hospital or clinic and will not be stored at home. NOTE: This sheet is a summary. It may not cover all possible information. If you have questions about this medicine, talk to your doctor, pharmacist, or health care provider.  2018 Elsevier/Gold Standard (2007-07-10 14:38:05) Paclitaxel injection What is this medicine? PACLITAXEL (PAK li TAX el) is a chemotherapy drug. It targets fast dividing cells, like cancer cells, and causes these cells to die. This medicine is used to treat ovarian cancer, breast  cancer, and other cancers. This medicine may be used for other purposes; ask your health care provider or pharmacist if you have questions. COMMON BRAND NAME(S): Onxol, Taxol What should I tell my health care provider before I take this medicine? They need to know if you have any of these conditions: -blood disorders -irregular heartbeat -infection (especially a virus infection such as chickenpox, cold sores, or herpes) -liver disease -previous or ongoing radiation therapy -an unusual or allergic reaction to paclitaxel, alcohol, polyoxyethylated castor oil, other chemotherapy agents, other medicines, foods, dyes, or preservatives -pregnant or trying to get pregnant -breast-feeding How should I use this medicine? This drug is given as an infusion into a vein. It is administered in a hospital or clinic by a specially trained health care professional. Talk to your pediatrician regarding the use of this medicine in children. Special care may be needed. Overdosage: If you think you have taken too much of this medicine contact a poison control center or emergency room at once. NOTE: This medicine is only for you. Do not share this medicine with others. What if I miss a dose? It is important not to miss your dose. Call your doctor or health care professional if you are unable to keep an appointment. What may interact with this medicine? Do not take this medicine with any of the following medications: -disulfiram -metronidazole This medicine may also interact with the following medications: -cyclosporine -diazepam -ketoconazole -medicines to increase blood counts like filgrastim, pegfilgrastim, sargramostim -other chemotherapy drugs like cisplatin,   doxorubicin, epirubicin, etoposide, teniposide, vincristine -quinidine -testosterone -vaccines -verapamil Talk to your doctor or health care professional before taking any of these  medicines: -acetaminophen -aspirin -ibuprofen -ketoprofen -naproxen This list may not describe all possible interactions. Give your health care provider a list of all the medicines, herbs, non-prescription drugs, or dietary supplements you use. Also tell them if you smoke, drink alcohol, or use illegal drugs. Some items may interact with your medicine. What should I watch for while using this medicine? Your condition will be monitored carefully while you are receiving this medicine. You will need important blood work done while you are taking this medicine. This medicine can cause serious allergic reactions. To reduce your risk you will need to take other medicine(s) before treatment with this medicine. If you experience allergic reactions like skin rash, itching or hives, swelling of the face, lips, or tongue, tell your doctor or health care professional right away. In some cases, you may be given additional medicines to help with side effects. Follow all directions for their use. This drug may make you feel generally unwell. This is not uncommon, as chemotherapy can affect healthy cells as well as cancer cells. Report any side effects. Continue your course of treatment even though you feel ill unless your doctor tells you to stop. Call your doctor or health care professional for advice if you get a fever, chills or sore throat, or other symptoms of a cold or flu. Do not treat yourself. This drug decreases your body's ability to fight infections. Try to avoid being around people who are sick. This medicine may increase your risk to bruise or bleed. Call your doctor or health care professional if you notice any unusual bleeding. Be careful brushing and flossing your teeth or using a toothpick because you may get an infection or bleed more easily. If you have any dental work done, tell your dentist you are receiving this medicine. Avoid taking products that contain aspirin, acetaminophen, ibuprofen,  naproxen, or ketoprofen unless instructed by your doctor. These medicines may hide a fever. Do not become pregnant while taking this medicine. Women should inform their doctor if they wish to become pregnant or think they might be pregnant. There is a potential for serious side effects to an unborn child. Talk to your health care professional or pharmacist for more information. Do not breast-feed an infant while taking this medicine. Men are advised not to father a child while receiving this medicine. This product may contain alcohol. Ask your pharmacist or healthcare provider if this medicine contains alcohol. Be sure to tell all healthcare providers you are taking this medicine. Certain medicines, like metronidazole and disulfiram, can cause an unpleasant reaction when taken with alcohol. The reaction includes flushing, headache, nausea, vomiting, sweating, and increased thirst. The reaction can last from 30 minutes to several hours. What side effects may I notice from receiving this medicine? Side effects that you should report to your doctor or health care professional as soon as possible: -allergic reactions like skin rash, itching or hives, swelling of the face, lips, or tongue -low blood counts - This drug may decrease the number of white blood cells, red blood cells and platelets. You may be at increased risk for infections and bleeding. -signs of infection - fever or chills, cough, sore throat, pain or difficulty passing urine -signs of decreased platelets or bleeding - bruising, pinpoint red spots on the skin, black, tarry stools, nosebleeds -signs of decreased red blood cells - unusually weak or   tired, fainting spells, lightheadedness -breathing problems -chest pain -high or low blood pressure -mouth sores -nausea and vomiting -pain, swelling, redness or irritation at the injection site -pain, tingling, numbness in the hands or feet -slow or irregular heartbeat -swelling of the ankle,  feet, hands Side effects that usually do not require medical attention (report to your doctor or health care professional if they continue or are bothersome): -bone pain -complete hair loss including hair on your head, underarms, pubic hair, eyebrows, and eyelashes -changes in the color of fingernails -diarrhea -loosening of the fingernails -loss of appetite -muscle or joint pain -red flush to skin -sweating This list may not describe all possible side effects. Call your doctor for medical advice about side effects. You may report side effects to FDA at 1-800-FDA-1088. Where should I keep my medicine? This drug is given in a hospital or clinic and will not be stored at home. NOTE: This sheet is a summary. It may not cover all possible information. If you have questions about this medicine, talk to your doctor, pharmacist, or health care provider.  2018 Elsevier/Gold Standard (2015-02-03 19:58:00)  

## 2018-02-08 NOTE — Progress Notes (Signed)
START ON PATHWAY REGIMEN - Uterine     A cycle is every 21 days:     Paclitaxel      Carboplatin   **Always confirm dose/schedule in your pharmacy ordering system**  Patient Characteristics: Papillary Serous and Clear Cell Histology, Newly Diagnosed, Resected Histology: Papillary Serous and Clear Cell Histology Therapeutic Status: Newly Diagnosed AJCC T Category: T3 AJCC N Category: N0 AJCC M Category: M1 AJCC 8 Stage Grouping: IVB Surgical Status: Resected Intent of Therapy: Non-Curative / Palliative Intent, Discussed with Patient

## 2018-02-08 NOTE — Assessment & Plan Note (Addendum)
Unfortunately, the patient is found to have stage IV metastatic disease to the axillary lymph node We discussed the treatment of choice would be combination chemotherapy with carboplatin and Taxol with a visit of the addition of bevacizumab in the future I recommend minimum 3 cycles of treatment before repeat imaging study I will get her scheduled for port placement and chemo education class  We reviewed the NCCN guidelines We discussed the role of chemotherapy. The intent is of palliative intent.  We discussed some of the risks, benefits, side-effects of carboplatin & Taxol. Treatment is intravenous, every 3 weeks x 6 cycles  Some of the short term side-effects included, though not limited to, including weight loss, life threatening infections, risk of allergic reactions, need for transfusions of blood products, nausea, vomiting, change in bowel habits, loss of hair, admission to hospital for various reasons, and risks of death.   Long term side-effects are also discussed including risks of infertility, permanent damage to nerve function, hearing loss, chronic fatigue, kidney damage with possibility needing hemodialysis, and rare secondary malignancy including bone marrow disorders.  The patient is aware that the response rates discussed earlier is not guaranteed.  After a long discussion, patient made an informed decision to proceed with the prescribed plan of care.   Patient education material was dispensed. We discussed premedication with dexamethasone before chemotherapy. I plan to reduce oral premedication along with IV premedication due to poorly controlled diabetes mellitus I also plan to reduce Taxol slightly due to anticipated high risk of peripheral neuropathy With her young age, I do not anticipate prophylactic G-CSF support We will get pathologist to add additional molecular markers with ER, PR, HER-2/neu status

## 2018-02-08 NOTE — Telephone Encounter (Signed)
Requested Her 2 neu testing on Accession: [id number] with [facility] in pathology.

## 2018-02-08 NOTE — Progress Notes (Signed)
Follow-up Note: Gyn-Onc   Krista Johnson 67 y.o. female  Chief Complaint  Patient presents with  . 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  Plan:  I discussed the advanced nature of her cancer. I discussed that due to involvement of distant sites, we recommend systemic therapy with chemotherapy (carboplatin and paclitaxel).  She may benefit from vaginal brachytherapy for local control.   HPI: 67 year old Afro-American female seen in consultation at the request of Dr. Lorriane Shire a good regarding management of a newly diagnosed high-grade endometrial carcinoma.  The patient noted some postmenopausal bleeding and was promptly seen by Dr. 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  Patient denies any other pelvic symptoms and denies any GI or GU problems.  Interval Hx: On preop labs she was noted to have extreme hyperglycemia, was admitted to hospital and was placed on an insulin regimen. She has done well since that time.  Preop imaging with a CT abd/pelvis showed a concerning area in the peripheral liver, T12 and axillary node enlargement.  This was followed by a PET on 01/09/18 which showed no abnormal liver enhancement and no T12 enhancement (suggesting that there was not metastatic disease at these sites), however there was PET avid and enlarged bilateral axillary nodes. There were no other concerning sites for metastatic disease, including no other enlarged lymph nodes.   On 01/23/18 she underwent robotic assisted total hysterectomy, BSO, left SLN biopsy and right pelvic and para-aortic lymphadenectomy. Final pathology confirmed a stage IIIA high grade serous carcinoma with full thickness myometrial involvement, serosal involvement, ovarian involvement and peritoneal cul de sac implants positive for metastatic carcinoma.    On 02/01/18 she underwent biopsy of the right axillary node which was positive for metastatic carcinoma consistent with her endometrial primary.  Review of Systems:10 point review of systems is negative except as noted in interval history.   Vitals: Blood pressure 138/88, pulse (!) 106, temperature (!) 97.4 F (36.3 C), temperature source Oral, resp. rate 20, height '5\' 6"'  (1.676 m), weight 189 lb (85.7 kg), SpO2 100 %.  Physical Exam: General : The patient is a healthy woman in no acute distress.  HEENT: normocephalic, extraoccular movements normal; neck is supple without thyromegally  Lynphnodes: Supraclavicular and inguinal nodes not enlarged  Abdomen: Soft, non-tender, no ascites, no organomegally, no masses, no hernias. Well healed incisions.  Pelvic:  Vaginal cuff in tact, no bleeding.   Bi-manual examination: Non-tender; no adenxal masses or nodularity    Lower extremities: No edema or varicosities. Normal range of motion      Allergies  Allergen Reactions  . Sulfa Antibiotics Nausea Only  . Sulfamethoxazole Nausea Only    Past Medical History:  Diagnosis Date  . Arthritis    right knee--- last cortisone injection 04/ 2019  . CKD (chronic kidney disease)   . Diabetes mellitus without complication [facility])    endocrinologist-  dr Loanne Drilling  . Endometrial cancer ([place])   . History of colon polyps   . Hyperlipidemia   . Hyperparathyroidism ([place])   . Hypertension     Past Surgical History:  Procedure Laterality Date  . COLONOSCOPY  last one 12-25-2017  . ROBOTIC ASSISTED TOTAL HYSTERECTOMY WITH BILATERAL SALPINGO OOPHERECTOMY N/A 01/23/2018   Procedure: XI ROBOTIC ASSISTED TOTAL HYSTERECTOMY WITH BILATERAL SALPINGO OOPHORECTOMY;  Surgeon: Everitt Amber, MD;  Location: [facility]S;  Service: Gynecology;  Laterality: N/A;  .  SENTINEL NODE BIOPSY N/A 01/23/2018   Procedure: SENTINEL NODE BIOPSY;  Surgeon: Everitt Amber, MD;  Location: [facility];  Service: Gynecology;  Laterality: N/A;     Current Outpatient Medications  Medication Sig Dispense Refill  . blood glucose meter kit and supplies KIT Dispense based on patient and insurance preference. Use up to four times daily as directed. (FOR ICD-9 250.00, 250.01). 1 each 0  . Insulin Isophane & Regular Human (NOVOLIN 70/30 FLEXPEN) (70-30) 100 UNIT/ML PEN Inject 25 Units into the skin daily with breakfast. and pen needles 1/day (Patient taking differently: Inject 25 Units into the skin daily with breakfast. and pen needles 1/day) 15 mL 11  . ketoconazole (NIZORAL) 2 % cream Apply to web space twice a day for 1 week then daily for 1 month (Patient taking differently: Apply 1 application topically daily as needed for irritation. Apply to web space twice a day for 1 week then daily for 1 month) 15 g 0  . lisinopril-hydrochlorothiazide (PRINZIDE,ZESTORETIC) 20-12.5 MG tablet Take 1 tablet by mouth daily. (Patient taking differently: Take 1 tablet by mouth every morning. ) 30 tablet 11  . ONETOUCH DELICA LANCETS 55H MISC 3 (three) times daily. as directed  5  . ONETOUCH VERIO test strip 3 (three) times daily. as directed  5  . senna-docusate (SENOKOT-S) 8.6-50 MG tablet Take 1 tablet by mouth at bedtime as needed for mild constipation or moderate constipation. 20 tablet 0  . simvastatin (ZOCOR) 10 MG tablet Take 10 mg by mouth at bedtime.     . Vitamin D, Ergocalciferol, 2000 units CAPS Take 2,000 Units by mouth daily.   0   No current facility-administered medications for this visit.     Social History   Socioeconomic History  . Marital status: Single    Spouse name: Not on file  . Number of children: Not on file  . Years of education: Not on file  . Highest education level: Not on file  Occupational History  . Not on file  Social Needs  . Financial resource strain: Not on file  . Food insecurity:    Worry: Not on file    Inability: Not on file  . Transportation needs:    Medical: Not on file    Non-medical: Not on file   Tobacco Use  . Smoking status: Never Smoker  . Smokeless tobacco: Never Used  Substance and Sexual Activity  . Alcohol use: Not Currently    Alcohol/week: 0.0 standard drinks    Frequency: Never  . Drug use: Never  . Sexual activity: Not on file  Lifestyle  . Physical activity:    Days per week: Not on file    Minutes per session: Not on file  . Stress: Not on file  Relationships  . Social connections:    Talks on phone: Not on file    Gets together: Not on file    Attends religious service: Not on file    Active member of club or organization: Not on file    Attends meetings of clubs or organizations: Not on file    Relationship status: Not on file  . Intimate partner violence:    Fear of current or ex partner: Not on file    Emotionally abused: Not on file    Physically abused: Not on file    Forced sexual activity: Not on file  Other Topics Concern  . Not on file  Social History Narrative  . Not on file  Family History  Problem Relation Age of Onset  . Diabetes Mother   . Hypertension Mother   . Diabetes Sister   . Hypertension Sister   . Diabetes Maternal Uncle   . Colon cancer Paternal Aunt   . Diabetes Paternal Aunt   . Stomach cancer Neg Hx   . Rectal cancer Neg Hx   . Esophageal cancer Neg Hx   . Colon polyps Neg Hx      30 minutes of direct face to face counseling time was spent with the patient. This included discussion about prognosis, therapy recommendations and postoperative side effects and are beyond the scope of routine postoperative care.   Thereasa Solo, MD 02/08/2018, 12:03 PM

## 2018-02-09 DIAGNOSIS — Z7189 Other specified counseling: Secondary | ICD-10-CM | POA: Insufficient documentation

## 2018-02-09 DIAGNOSIS — N1831 Chronic kidney disease, stage 3a: Secondary | ICD-10-CM | POA: Insufficient documentation

## 2018-02-09 DIAGNOSIS — N183 Chronic kidney disease, stage 3 unspecified: Secondary | ICD-10-CM | POA: Insufficient documentation

## 2018-02-09 DIAGNOSIS — C779 Secondary and unspecified malignant neoplasm of lymph node, unspecified: Secondary | ICD-10-CM | POA: Insufficient documentation

## 2018-02-09 DIAGNOSIS — N1832 Chronic kidney disease, stage 3b: Secondary | ICD-10-CM | POA: Insufficient documentation

## 2018-02-09 NOTE — Progress Notes (Signed)
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 The intent is of palliative intent.  We discussed some of the risks, benefits, side-effects of carboplatin & Taxol. Treatment is intravenous, every 3 weeks x 6 cycles  Some of the short term side-effects included, though not limited to, including weight loss, life threatening infections, risk of allergic reactions, need for transfusions of blood products, nausea, vomiting, change in bowel habits, loss of hair, admission to hospital for various reasons, and risks of death.   Long term side-effects are also discussed including risks of infertility, permanent damage to nerve function, hearing loss, chronic fatigue, kidney damage with possibility needing hemodialysis, and rare secondary malignancy including bone marrow disorders.  The patient is aware that the response rates discussed earlier is not guaranteed.  After a long discussion, patient made an informed decision to proceed with the prescribed plan of care.   Patient education material was dispensed. We discussed premedication with dexamethasone before chemotherapy. I plan to reduce oral premedication along with IV premedication due to poorly controlled diabetes mellitus I also plan to reduce Taxol slightly due to anticipated high risk of peripheral neuropathy With her young age, I do not anticipate prophylactic G-CSF  support We will get pathologist to add additional molecular markers with ER, PR, HER-2/neu status  Metastasis to lymph nodes (HCC) The lymph nodes are palpable on exam We will follow clinically and a plan to repeat imaging study after 3 cycles of therapy  Chronic kidney disease (CKD), stage III (moderate) ([place]) She had recent acute on chronic renal failure We discussed importance of adequate hydration I will adjust the dose of her treatment accordingly  Diabetes mellitus, insulin dependent (IDDM), uncontrolled ([place]) She has poorly controlled diabetes I will reduce the dose of dexamethasone We discussed importance of dietary modification while on treatment  Goals of care, counseling/discussion The patient is aware she has incurable disease and treatment is strictly palliative. We discussed importance of Advanced Directives and Living will.   Orders Placed This Encounter  Procedures  . IR IMAGING GUIDED PORT INSERTION    Standing Status:   Future    Standing Expiration Date:   04/11/2019    Order Specific Question:   Reason for Exam (SYMPTOM  OR DIAGNOSIS REQUIRED)    Answer:   need port for chemo to start 11/6    Order Specific Question:   Preferred Imaging Location?    Answer:   [facility] [place]  . CBC with Differential ([place] Only)    Standing Status:   Standing    Number of Occurrences:   20    Standing Expiration Date:   02/09/2019  . CMP ([place] only)    Standing Status:   Standing    Number of Occurrences:   20    Standing Expiration Date:   02/09/2019     CHIEF COMPLAINTS/PURPOSE OF CONSULTATION:  Metastatic uterine cancer, for further management HISTORY OF PRESENTING ILLNESS:  Krista Johnson 67 y.o. female is here because of recent diagnosis of cancer. She is here by herself. She lives with her mother and her caregiver is Freda Munro  I have reviewed her chart and materials related to her cancer extensively and collaborated history with the  patient. 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 Leo Grosser who obtained an endometrial biopsy showing poorly differentiated endometrial carcinoma and negative endocervical curettage    12/12/2017 Imaging    MAMMOGRAM FINDINGS: In the right axilla, a possible mass warrants further evaluation. In the left breast, no findings suspicious for malignancy.  Images were processed with CAD.  IMPRESSION: Further evaluation is suggested for possible mass in the right axilla.     12/24/2017 Imaging    Ct scan chest, abdomen and pelvis 1. Marked thickening of the endometrium (42 mm) compatible with known primary endometrial malignancy. No evidence of extrauterine invasion. 2. No pelvic or retroperitoneal adenopathy. 3. Right middle lobe 4 mm solid pulmonary nodule, for which follow-up chest CT is advised in 3-6 months. 4. Several findings that are equivocal for distant metastatic disease. Vaguely nodular heterogeneous hyperenhancement in the peripheral right liver lobe, which could represent benign transient perfusional phenomena, with underlying liver lesions not entirely excluded. Mildly sclerotic T12 vertebral lesion. Mildly enlarged right axillary lymph node. The best single test to further evaluate these findings would be a PET-CT. Alternative tests include bone scan or thoracic MRI without and with IV contrast for the T12 osseous lesion, MRI abdomen without and with IV contrast for the liver findings, and diagnostic mammographic evaluation for the right axillary node. 5.  Aortic Atherosclerosis (ICD10-I70.0).     01/09/2018 PET scan    1. Moderate hypermetabolism corresponding to enlarging axillary node since 12/22/2017. Highly suspicious for  an atypical distribution of metastatic disease. 2. No hypermetabolism to suggest hepatic or T12 osseous metastasis. 3. Hypermetabolic endometrial primary.    01/23/2018 Initial Diagnosis    Endometrial cancer ([place])    01/23/2018 Pathology Results    1. Lymph node, sentinel, biopsy, left external iliac - NO CARCINOMA IDENTIFIED IN ONE LYMPH NODE (0/1) - SEE COMMENT 2. Lymph nodes, regional resection, right para aortic - NO CARCINOMA IDENTIFIED IN FOUR LYMPH NODES (0/4) - SEE COMMENT 3. Lymph nodes, regional resection, right pelvic - NO CARCINOMA IDENTIFIED IN EIGHT LYMPH NODES (0/8) - SEE COMMENT 4. Cul-de-sac biopsy - METASTATIC CARCINOMA 5. Uterus +/- tubes/ovaries, neoplastic, cervix, bilateral fallopian tubes and ovaries UTERUS: - MIXED SEROUS AND ENDOMETRIOID CARCINOMA - SEROSAL IMPLANTS PRESENT - LYMPHOVASCULAR SPACE INVASION PRESENT - LEIOMYOMATA (1.5 CM; LARGEST) - SEE ONCOLOGY TABLE AND COMMENT BELOW CERVIX: - BENIGN NABOTHIAN CYSTS - NO CARCINOMA IDENTIFIED BILATERAL OVARIES: - METASTATIC CARCINOMA PRESENT ON OVARIAN SURFACE BILATERAL FALLOPIAN TUBES: - INTRALUMINAL CARCINOMA Microscopic Comment 1. -3. Cytokeratin AE1/3 was performed on the sentinel lymph nodes to exclude micrometastasis. There is no evidence of metastatic carcinoma by immunohistochemistry. 5. 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%) Histologic Grade: N/A Myometrial invasion: Estimated less than 50% myometrial invasion (0.3 cm of myometrium involved; 1.4 cm measured thickness) Uterine Serosa Involvement: Present Cervical  stromal involvement: Not identified Extent of involvement of other organs: - Fallopian tube (left within the lumen) - Ovary, left (surface  involvement) - Cul-de-sac Lymphovascular invasion: Present Regional Lymph Nodes: Examined: 1 Sentinel 12 Non-sentinel 13 Total Lymph nodes with metastasis: 0 Isolated tumor cells (< 0.2 mm): 0 Micrometastasis: (> 0.2 mm and < 2.0 mm): 0 Macrometastasis: (> 2.0 mm): 0 Extracapsular extension: N/A Tumor block for ancillary studies: 5E, 5B MMR / MSI testing: Pending will be reported separately Pathologic Stage Classification (pTNM, AJCC 8th edition): pT3a, pN0 FIGO Stage: IIIA COMMENT: There is tumor present on the surface of the left ovary and within the lumen of the left fallopian tube. The carcinoma appears to be mixed with the largest component being high grade serous carcinoma. Dr. Lyndon Code reviewed the case and agrees with the above diagnosis.    01/23/2018 Surgery    Surgeon: Donaciano Eva   Operation: Robotic-assisted laparoscopic total hysterectomy with bilateral salpingoophorectomy, SLN injection, mapping and biopsy, right pelvic and para-aortic lymphadenectomy  Operative Findings:  : 10-12cm bulky uterus with frank serosal involvement on posterior cul de sac peritoneum and anterior peritoneum which adhesed the bladder to the anterior uterus. Unilateral mapping on left pelvis. No grossly suspicious nodes. Normal omentum and diaphragms.     02/01/2018 Pathology Results    Lymph node, needle/core biopsy, right axilla - METASTATIC CARCINOMA - SEE COMMENT Microscopic Comment The neoplastic cells are positive for cytokeratin 7 and Pax-8 but negative for cytokeratin 5/6, cytokeratin 20, Gata-3, p63, p53 and GCDFP. Overall, the immunoprofile is consistent with metastasis from the patient's known gynecologic carcinoma.    02/01/2018 Procedure    Ultrasound-guided core biopsies of a suspicious right axillary lymph node.    She is recovering well from surgery.  Denies recent weight loss, nausea or changes in bowel habits.  MEDICAL HISTORY:  Past Medical History:  Diagnosis  Date  . Arthritis    right knee--- last cortisone injection 04/ 2019  . CKD (chronic kidney disease)   . Diabetes mellitus without complication [facility])    endocrinologist-  dr Loanne Drilling  . Endometrial cancer ([place])   . History of colon polyps   . Hyperlipidemia   . Hyperparathyroidism ([place])   . Hypertension     SURGICAL HISTORY: Past Surgical History:  Procedure Laterality Date  . COLONOSCOPY  last one 12-25-2017  . ROBOTIC ASSISTED TOTAL HYSTERECTOMY WITH BILATERAL SALPINGO OOPHERECTOMY N/A 01/23/2018   Procedure: XI ROBOTIC ASSISTED TOTAL HYSTERECTOMY WITH BILATERAL SALPINGO OOPHORECTOMY;  Surgeon: Everitt Amber, MD;  Location: [facility]S;  Service: Gynecology;  Laterality: N/A;  . SENTINEL NODE BIOPSY N/A 01/23/2018   Procedure: SENTINEL NODE BIOPSY;  Surgeon: Everitt Amber, MD;  Location: [facility] ORS;  Service: Gynecology;  Laterality: N/A;    SOCIAL HISTORY: Social History   Socioeconomic History  . Marital status: Single    Spouse name: Not on file  . Number of children: 0  . Years of education: Not on file  . Highest education level: Not on file  Occupational History  . Occupation: retired Education officer, museum  Social Needs  . Financial resource strain: Not on file  . Food insecurity:    Worry: Not on file    Inability: Not on file  . Transportation needs:    Medical: Not on file    Non-medical: Not on file  Tobacco Use  . Smoking status: Never Smoker  . Smokeless tobacco: Never Used  Substance and Sexual Activity  . Alcohol use: Not Currently  Alcohol/week: 0.0 standard drinks    Frequency: Never  . Drug use: Never  . Sexual activity: Not on file  Lifestyle  . Physical activity:    Days per week: Not on file    Minutes per session: Not on file  . Stress: Not on file  Relationships  . Social connections:    Talks on phone: Not on file    Gets together: Not on file    Attends religious service: Not on file    Active member of club or organization: Not on file    Attends  meetings of clubs or organizations: Not on file    Relationship status: Not on file  . Intimate partner violence:    Fear of current or ex partner: Not on file    Emotionally abused: Not on file    Physically abused: Not on file    Forced sexual activity: Not on file  Other Topics Concern  . Not on file  Social History Narrative  . Not on file    FAMILY HISTORY: Family History  Problem Relation Age of Onset  . Diabetes Mother   . Hypertension Mother   . Diabetes Sister   . Hypertension Sister   . Diabetes Maternal Uncle   . Colon cancer Paternal Aunt   . Diabetes Paternal Aunt   . Stomach cancer Neg Hx   . Rectal cancer Neg Hx   . Esophageal cancer Neg Hx   . Colon polyps Neg Hx     ALLERGIES:  is allergic to sulfa antibiotics and sulfamethoxazole.  MEDICATIONS:  Current Outpatient Medications  Medication Sig Dispense Refill  . blood glucose meter kit and supplies KIT Dispense based on patient and insurance preference. Use up to four times daily as directed. (FOR ICD-9 250.00, 250.01). 1 each 0  . dexamethasone (DECADRON) 4 MG tablet Take 2 tabs at the night before and 2 tabs the morning of chemotherapy, every 3 weeks, by mouth 24 tablet 0  . Insulin Isophane & Regular Human (NOVOLIN 70/30 FLEXPEN) (70-30) 100 UNIT/ML PEN Inject 25 Units into the skin daily with breakfast. and pen needles 1/day (Patient taking differently: Inject 25 Units into the skin daily with breakfast. and pen needles 1/day) 15 mL 11  . ketoconazole (NIZORAL) 2 % cream Apply to web space twice a day for 1 week then daily for 1 month (Patient taking differently: Apply 1 application topically daily as needed for irritation. Apply to web space twice a day for 1 week then daily for 1 month) 15 g 0  . lidocaine-prilocaine (EMLA) cream Apply to affected area once 30 g 3  . lisinopril-hydrochlorothiazide (PRINZIDE,ZESTORETIC) 20-12.5 MG tablet Take 1 tablet by mouth daily. (Patient taking differently: Take 1  tablet by mouth every morning. ) 30 tablet 11  . ondansetron (ZOFRAN) 8 MG tablet Take 1 tablet (8 mg total) by mouth every 8 (eight) hours as needed for refractory nausea / vomiting. Start on day 3 after chemo. 30 tablet 1  . ONETOUCH DELICA LANCETS 23F MISC 3 (three) times daily. as directed  5  . ONETOUCH VERIO test strip 3 (three) times daily. as directed  5  . prochlorperazine (COMPAZINE) 10 MG tablet Take 1 tablet (10 mg total) by mouth every 6 (six) hours as needed (Nausea or vomiting). 30 tablet 1  . senna-docusate (SENOKOT-S) 8.6-50 MG tablet Take 1 tablet by mouth at bedtime as needed for mild constipation or moderate constipation. 20 tablet 0  . simvastatin (ZOCOR) 10 MG  tablet Take 10 mg by mouth at bedtime.     . Vitamin D, Ergocalciferol, 2000 units CAPS Take 2,000 Units by mouth daily.   0   No current facility-administered medications for this visit.     REVIEW OF SYSTEMS:   Constitutional: Denies fevers, chills or abnormal night sweats Eyes: Denies blurriness of vision, double vision or watery eyes Ears, nose, mouth, throat, and face: Denies mucositis or sore throat Respiratory: Denies cough, dyspnea or wheezes Cardiovascular: Denies palpitation, chest discomfort or lower extremity swelling Gastrointestinal:  Denies nausea, heartburn or change in bowel habits Skin: Denies abnormal skin rashes Lymphatics: Denies new lymphadenopathy or easy bruising Neurological:Denies numbness, tingling or new weaknesses Behavioral/Psych: Mood is stable, no new changes  All other systems were reviewed with the patient and are negative.  PHYSICAL EXAMINATION: ECOG PERFORMANCE STATUS: 1 - Symptomatic but completely ambulatory Blood pressure is 138/88 Heart rate 106 Respiration rate 20 Temperature 97.4 GENERAL:alert, no distress and comfortable SKIN: skin color, texture, turgor are normal, no rashes or significant lesions EYES: normal, conjunctiva are pink and non-injected, sclera  clear OROPHARYNX:no exudate, no erythema and lips, buccal mucosa, and tongue normal  NECK: supple, thyroid normal size, non-tender, without nodularity LYMPH: She has palpable lymphadenopathy in the axilla bilaterally LUNGS: clear to auscultation and percussion with normal breathing effort HEART: regular rate & rhythm and no murmurs and no lower extremity edema ABDOMEN:abdomen soft, non-tender and normal bowel sounds Musculoskeletal:no cyanosis of digits and no clubbing  PSYCH: alert & oriented x 3 with fluent speech NEURO: no focal motor/sensory deficits  LABORATORY DATA:  I have reviewed the data as listed Lab Results  Component Value Date   WBC 5.6 02/01/2018   HGB 11.6 (L) 02/01/2018   HCT 38.2 02/01/2018   MCV 89.7 02/01/2018   PLT 372 02/01/2018   Recent Labs    12/12/17 1759  01/18/18 0940 01/22/18 1109 01/24/18 0435  NA 133*   < > 143 139 141  K 3.7   < > 4.2 4.2 4.1  CL 94*   < > 110 105 109  CO2 22  --  '23 27 23  ' GLUCOSE 454*   < > 199* 161* 164*  BUN 44*   < > 21 21 25*  CREATININE 1.83*   < > 1.41* 1.17 1.41*  CALCIUM 10.6*  --  10.0 10.4  10.4 9.8  GFRNONAA 27*  --  38*  --  38*  GFRAA 32*  --  44*  --  44*  PROT  --   --  6.5  --   --   ALBUMIN  --   --  3.6  --   --   AST  --   --  18  --   --   ALT  --   --  15  --   --   ALKPHOS  --   --  59  --   --   BILITOT  --   --  0.5  --   --    < > = values in this interval not displayed.    RADIOGRAPHIC STUDIES: I have personally reviewed the radiological images as listed and agreed with the findings in the report. [place] Core Biopsy (soft Tissue)  Result Date: 02/01/2018 INDICATION: 67 year old with endometrial cancer and suspicious bilateral axillary lymphadenopathy. EXAM: RIGHT AXILLARY LYMPH NODE BIOPSY WITH ULTRASOUND GUIDANCE MEDICATIONS: None. ANESTHESIA/SEDATION: Moderate (conscious) sedation was employed during this procedure. A total of Versed 2.0 mg and Fentanyl  100 mcg was administered  intravenously. Moderate Sedation Time: 22 minutes. The patient's level of consciousness and vital signs were monitored continuously by radiology nursing throughout the procedure under my direct supervision. FLUOROSCOPY TIME:  None COMPLICATIONS: None immediate. PROCEDURE: Informed written consent was obtained from the patient after a thorough discussion of the procedural risks, benefits and alternatives. All questions were addressed. A timeout was performed prior to the initiation of the procedure. The axillary regions were evaluated with ultrasound. The right axillary lymph node was selected for biopsy. The right axillary region was prepped with chlorhexidine and sterile field was created. Skin and soft tissues were anesthetized with 1% lidocaine. 58 gauge core biopsy needle was directed into the right axillary lymph node with real-time ultrasound guidance. A total of 6 core biopsies were obtained. Specimens placed in saline. Bandage placed over the puncture site. FINDINGS: Round hypoechoic lymph node in the right axilla measuring 2.3 x 2.2 x 2.4 cm. No significant bleeding or hematoma formation following the core biopsies. Round hypoechoic left axillary lymph node measures 1.7 x 1.4 x 1.9 cm. IMPRESSION: Ultrasound-guided core biopsies of a suspicious right axillary lymph node. Electronically Signed   By: Markus Daft M.D.   On: 02/01/2018 15:55    I spent 55 minutes counseling the patient face to face. The total time spent in the appointment was 60 minutes and more than 50% was on counseling.  All questions were answered. The patient knows to call the clinic with any problems, questions or concerns.  [name], MD 02/09/2018 3:22 PM

## 2018-02-09 NOTE — Assessment & Plan Note (Signed)
The lymph nodes are palpable on exam We will follow clinically and a plan to repeat imaging study after 3 cycles of therapy

## 2018-02-09 NOTE — Assessment & Plan Note (Signed)
The patient is aware she has incurable disease and treatment is strictly palliative. We discussed importance of Advanced Directives and Living will. 

## 2018-02-09 NOTE — Assessment & Plan Note (Signed)
She has poorly controlled diabetes I will reduce the dose of dexamethasone We discussed importance of dietary modification while on treatment

## 2018-02-09 NOTE — Assessment & Plan Note (Signed)
She had recent acute on chronic renal failure We discussed importance of adequate hydration I will adjust the dose of her treatment accordingly

## 2018-02-12 ENCOUNTER — Ambulatory Visit (HOSPITAL_COMMUNITY): Payer: Medicare Other

## 2018-02-12 ENCOUNTER — Encounter: Payer: Self-pay | Admitting: Oncology

## 2018-02-12 NOTE — Progress Notes (Signed)
Gynecologic Oncology Multi-Disciplinary Disposition Conference Note  Date of the Conference: 02/12/2018  Patient Name: Krista Johnson  Referring Provider: Primary GYN Oncologist:  Stage/Disposition:  Stage 4 high-grade serous endometrial carcinoma. Disposition is to adjuvant chemotherapy with vaginal brachytherapy with restaging after 3 cycles.  Possible Herceptin depending on Her2/neu status.   This Multidisciplinary conference took place involving physicians from [place], [place], Radiation Oncology, Pathology, Radiology along with the Gynecologic Oncology Nurse Practitioner and RN.  Comprehensive assessment of the patient's malignancy, staging, need for surgery, chemotherapy, radiation therapy, and need for further testing were reviewed. Supportive measures, both inpatient and following discharge were also discussed. The recommended plan of care is documented. Greater than 35 minutes were spent correlating and coordinating this patient's care.

## 2018-02-14 ENCOUNTER — Inpatient Hospital Stay: Payer: Medicare Other

## 2018-02-16 ENCOUNTER — Telehealth: Payer: Self-pay

## 2018-02-16 NOTE — Telephone Encounter (Signed)
Patient is starting chemo 02/21/18 she will be doing this 6 times/ once every 3 weeks until 6th visit has been completed weeks and wants to know if there is anything she needs to do differently since she was told this will make her blood sugar go up-please contact patient with MD advice as soon as possible at [phone] or [phone]

## 2018-02-16 NOTE — Telephone Encounter (Signed)
Please review pt message below and advise. Thanks

## 2018-02-16 NOTE — Telephone Encounter (Signed)
Pt scheduled for 11/7 @ 2:30 but stated that she starts kemo on the 11/6 and will cancel if she is not feeling

## 2018-02-16 NOTE — Telephone Encounter (Signed)
Please move up next appt to 02/22/18

## 2018-02-19 ENCOUNTER — Ambulatory Visit: Payer: Medicare Other | Admitting: Gynecologic Oncology

## 2018-02-19 ENCOUNTER — Other Ambulatory Visit: Payer: Self-pay | Admitting: Physician Assistant

## 2018-02-20 ENCOUNTER — Other Ambulatory Visit: Payer: Medicare Other

## 2018-02-20 ENCOUNTER — Encounter (HOSPITAL_COMMUNITY): Payer: Self-pay | Admitting: Interventional Radiology

## 2018-02-20 ENCOUNTER — Ambulatory Visit (HOSPITAL_COMMUNITY)
Admission: RE | Admit: 2018-02-20 | Discharge: 2018-02-20 | Disposition: A | Discharge status: Home / Self Care | Payer: Medicare Other | Source: Ambulatory Visit | Attending: Hematology and Oncology | Admitting: Hematology and Oncology

## 2018-02-20 DIAGNOSIS — E785 Hyperlipidemia, unspecified: Secondary | ICD-10-CM | POA: Insufficient documentation

## 2018-02-20 DIAGNOSIS — C541 Malignant neoplasm of endometrium: Secondary | ICD-10-CM | POA: Diagnosis not present

## 2018-02-20 DIAGNOSIS — E1122 Type 2 diabetes mellitus with diabetic chronic kidney disease: Secondary | ICD-10-CM | POA: Diagnosis not present

## 2018-02-20 DIAGNOSIS — Z882 Allergy status to sulfonamides status: Secondary | ICD-10-CM | POA: Insufficient documentation

## 2018-02-20 DIAGNOSIS — Z452 Encounter for adjustment and management of vascular access device: Secondary | ICD-10-CM | POA: Diagnosis not present

## 2018-02-20 DIAGNOSIS — M1990 Unspecified osteoarthritis, unspecified site: Secondary | ICD-10-CM | POA: Insufficient documentation

## 2018-02-20 DIAGNOSIS — Z794 Long term (current) use of insulin: Secondary | ICD-10-CM | POA: Diagnosis not present

## 2018-02-20 DIAGNOSIS — Z8249 Family history of ischemic heart disease and other diseases of the circulatory system: Secondary | ICD-10-CM | POA: Insufficient documentation

## 2018-02-20 DIAGNOSIS — I129 Hypertensive chronic kidney disease with stage 1 through stage 4 chronic kidney disease, or unspecified chronic kidney disease: Secondary | ICD-10-CM | POA: Insufficient documentation

## 2018-02-20 DIAGNOSIS — N189 Chronic kidney disease, unspecified: Secondary | ICD-10-CM | POA: Diagnosis not present

## 2018-02-20 DIAGNOSIS — Z5111 Encounter for antineoplastic chemotherapy: Secondary | ICD-10-CM | POA: Diagnosis not present

## 2018-02-20 HISTORY — PX: IR IMAGING GUIDED PORT INSERTION: IMG5740

## 2018-02-20 LAB — COMPREHENSIVE METABOLIC PANEL
ALT: 11 U/L (ref 0–44)
AST: 16 U/L (ref 15–41)
Albumin: 4.1 g/dL (ref 3.5–5.0)
Alkaline Phosphatase: 67 U/L (ref 38–126)
Anion gap: 11 (ref 5–15)
BUN: 33 mg/dL — ABNORMAL HIGH (ref 8–23)
CO2: 23 mmol/L (ref 22–32)
Calcium: 10.6 mg/dL — ABNORMAL HIGH (ref 8.9–10.3)
Chloride: 107 mmol/L (ref 98–111)
Creatinine, Ser: 1.4 mg/dL — ABNORMAL HIGH (ref 0.44–1.00)
GFR calc Af Amer: 44 mL/min — ABNORMAL LOW (ref >60)
GFR calc non Af Amer: 38 mL/min — ABNORMAL LOW (ref >60)
Glucose, Bld: 76 mg/dL (ref 70–99)
Potassium: 3.5 mmol/L (ref 3.5–5.1)
Sodium: 141 mmol/L (ref 135–145)
Total Bilirubin: 0.5 mg/dL (ref 0.3–1.2)
Total Protein: 7.8 g/dL (ref 6.5–8.1)

## 2018-02-20 LAB — CBC WITH DIFFERENTIAL/PLATELET
Abs Immature Granulocytes: 0.01 10*3/uL (ref 0.00–0.07)
Basophils Absolute: 0 10*3/uL (ref 0.0–0.1)
Basophils Relative: 0 %
Eosinophils Absolute: 0.1 10*3/uL (ref 0.0–0.5)
Eosinophils Relative: 2 %
HCT: 37 % (ref 36.0–46.0)
Hemoglobin: 11.6 g/dL — ABNORMAL LOW (ref 12.0–15.0)
Immature Granulocytes: 0 %
Lymphocytes Relative: 44 %
Lymphs Abs: 2.2 10*3/uL (ref 0.7–4.0)
MCH: 27.1 pg (ref 26.0–34.0)
MCHC: 31.4 g/dL (ref 30.0–36.0)
MCV: 86.4 fL (ref 80.0–100.0)
Monocytes Absolute: 0.3 10*3/uL (ref 0.1–1.0)
Monocytes Relative: 6 %
Neutro Abs: 2.4 10*3/uL (ref 1.7–7.7)
Neutrophils Relative %: 48 %
Platelets: 282 10*3/uL (ref 150–400)
RBC: 4.28 MIL/uL (ref 3.87–5.11)
RDW: 12.2 % (ref 11.5–15.5)
WBC: 5 10*3/uL (ref 4.0–10.5)
nRBC: 0 % (ref 0.0–0.2)

## 2018-02-20 LAB — APTT: aPTT: 27 seconds (ref 24–36)

## 2018-02-20 LAB — GLUCOSE, CAPILLARY: Glucose-Capillary: 81 mg/dL (ref 70–99)

## 2018-02-20 LAB — PROTIME-INR
INR: 1.03
Prothrombin Time: 13.4 seconds (ref 11.4–15.2)

## 2018-02-20 MED ORDER — LIDOCAINE HCL 1 % IJ SOLN
INTRAMUSCULAR | Status: AC | PRN
  Administered 2018-02-20: 5 mL

## 2018-02-20 MED ORDER — FENTANYL CITRATE (PF) 100 MCG/2ML IJ SOLN
INTRAMUSCULAR | Status: AC | PRN
  Administered 2018-02-20 (×2): 50 ug via INTRAVENOUS

## 2018-02-20 MED ORDER — SODIUM CHLORIDE 0.9 % IV SOLN
INTRAVENOUS | Status: DC
  Administered 2018-02-20: 13:00:00 via INTRAVENOUS

## 2018-02-20 MED ORDER — FENTANYL CITRATE (PF) 100 MCG/2ML IJ SOLN
INTRAMUSCULAR | Status: AC
  Filled 2018-02-20: qty 2

## 2018-02-20 MED ORDER — HEPARIN SOD (PORK) LOCK FLUSH 100 UNIT/ML IV SOLN
INTRAVENOUS | Status: AC | PRN
  Administered 2018-02-20: 500 [IU] via INTRAVENOUS

## 2018-02-20 MED ORDER — DEXTROSE 50 % IV SOLN
25.0000 mL | Freq: Once | INTRAVENOUS | Status: AC
  Administered 2018-02-20: 25 mL via INTRAVENOUS

## 2018-02-20 MED ORDER — MIDAZOLAM HCL 2 MG/2ML IJ SOLN
INTRAMUSCULAR | Status: AC | PRN
  Administered 2018-02-20 (×3): 1 mg via INTRAVENOUS

## 2018-02-20 MED ORDER — LIDOCAINE HCL 1 % IJ SOLN
INTRAMUSCULAR | Status: AC
  Filled 2018-02-20: qty 20

## 2018-02-20 MED ORDER — LIDOCAINE HCL 1 % IJ SOLN
INTRAMUSCULAR | Status: AC | PRN
  Administered 2018-02-20: 10 mL

## 2018-02-20 MED ORDER — HEPARIN SOD (PORK) LOCK FLUSH 100 UNIT/ML IV SOLN
INTRAVENOUS | Status: AC
  Filled 2018-02-20: qty 5

## 2018-02-20 MED ORDER — MIDAZOLAM HCL 2 MG/2ML IJ SOLN
INTRAMUSCULAR | Status: AC
  Filled 2018-02-20: qty 4

## 2018-02-20 MED ORDER — CEFAZOLIN SODIUM-DEXTROSE 2-4 GM/100ML-% IV SOLN
INTRAVENOUS | Status: AC
  Administered 2018-02-20: 2 g via INTRAVENOUS
  Filled 2018-02-20: qty 100

## 2018-02-20 MED ORDER — DEXTROSE 50 % IV SOLN
INTRAVENOUS | Status: AC
  Filled 2018-02-20: qty 50

## 2018-02-20 MED ORDER — CEFAZOLIN SODIUM-DEXTROSE 2-4 GM/100ML-% IV SOLN
2.0000 g | Freq: Once | INTRAVENOUS | Status: AC
  Administered 2018-02-20: 2 g via INTRAVENOUS

## 2018-02-20 NOTE — H&P (Signed)
Chief Complaint: Patient was seen in consultation today for port placement at the request of [place]  Referring Physician(s): [name]  Supervising Physician: Marybelle Killings  Patient Status: [facility] - Out-pt  History of Present Illness: Krista Johnson is a 67 y.o. female with metastatic endometrial cancer. She is to start chemotherapy tomorrow and is referred for port placement. PMHx, meds, labs, imaging, allergies reviewed. Feels well, no recent fevers, chills, illness. Has been NPO today as directed. Family at bedside.   Past Medical History:  Diagnosis Date  . Arthritis    right knee--- last cortisone injection 04/ 2019  . CKD (chronic kidney disease)   . Diabetes mellitus without complication [facility])    endocrinologist-  dr Loanne Drilling  . Endometrial cancer ([place])   . History of colon polyps   . Hyperlipidemia   . Hyperparathyroidism ([place])   . Hypertension     Past Surgical History:  Procedure Laterality Date  . COLONOSCOPY  last one 12-25-2017  . ROBOTIC ASSISTED TOTAL HYSTERECTOMY WITH BILATERAL SALPINGO OOPHERECTOMY N/A 01/23/2018   Procedure: XI ROBOTIC ASSISTED TOTAL HYSTERECTOMY WITH BILATERAL SALPINGO OOPHORECTOMY;  Surgeon: Everitt Amber, MD;  Location: [facility] ORS;  Service: Gynecology;  Laterality: N/A;  . SENTINEL NODE BIOPSY N/A 01/23/2018   Procedure: SENTINEL NODE BIOPSY;  Surgeon: Everitt Amber, MD;  Location: [facility] ORS;  Service: Gynecology;  Laterality: N/A;    Allergies: Sulfa antibiotics and Sulfamethoxazole  Medications: Prior to Admission medications   Medication Sig Start Date End Date Taking? Authorizing Provider  Insulin Isophane & Regular Human (NOVOLIN 70/30 FLEXPEN) (70-30) 100 UNIT/ML PEN Inject 25 Units into the skin daily with breakfast. and pen needles 1/day Patient taking differently: Inject 25 Units into the skin daily with breakfast. and pen needles 1/day 01/05/18  Yes Renato Shin, MD  lisinopril-hydrochlorothiazide (PRINZIDE,ZESTORETIC)  20-12.5 MG tablet Take 1 tablet by mouth daily. Patient taking differently: Take 1 tablet by mouth every morning.  12/26/17  Yes Renato Shin, MD  Vitamin D, Ergocalciferol, 2000 units CAPS Take 2,000 Units by mouth daily.  10/23/17  Yes [provider]  blood glucose meter kit and supplies KIT Dispense based on patient and insurance preference. Use up to four times daily as directed. (FOR ICD-9 250.00, 250.01). 12/12/17   Providence Lanius A, PA-C  dexamethasone (DECADRON) 4 MG tablet Take 2 tabs at the night before and 2 tabs the morning of chemotherapy, every 3 weeks, by mouth 02/08/18   [name], MD  ketoconazole (NIZORAL) 2 % cream Apply to web space twice a day for 1 week then daily for 1 month Patient taking differently: Apply 1 application topically daily as needed for irritation. Apply to web space twice a day for 1 week then daily for 1 month 02/14/15   Leandrew Koyanagi, MD  lidocaine-prilocaine (EMLA) cream Apply to affected area once 02/08/18   [name], MD  ondansetron (ZOFRAN) 8 MG tablet Take 1 tablet (8 mg total) by mouth every 8 (eight) hours as needed for refractory nausea / vomiting. Start on day 3 after chemo. 02/08/18   [name], MD  [facility] DELICA LANCETS 33L MISC 3 (three) times daily. as directed 12/14/17   [provider]  [facility] VERIO test strip 3 (three) times daily. as directed 12/13/17   [provider]  prochlorperazine (COMPAZINE) 10 MG tablet Take 1 tablet (10 mg total) by mouth every 6 (six) hours as needed (Nausea or vomiting). 02/08/18   [name], MD  senna-docusate (SENOKOT-S) 8.6-50 MG tablet  Take 1 tablet by mouth at bedtime as needed for mild constipation or moderate constipation. 01/24/18   Cross, Lenna Sciara D, NP  simvastatin (ZOCOR) 10 MG tablet Take 10 mg by mouth at bedtime.  07/09/17   [provider]     Family History  Problem Relation Age of Onset  . Diabetes Mother   . Hypertension Mother   . Diabetes  Sister   . Hypertension Sister   . Diabetes Maternal Uncle   . Colon cancer Paternal Aunt   . Diabetes Paternal Aunt   . Stomach cancer Neg Hx   . Rectal cancer Neg Hx   . Esophageal cancer Neg Hx   . Colon polyps Neg Hx     Social History   Socioeconomic History  . Marital status: Single    Spouse name: Not on file  . Number of children: 0  . Years of education: Not on file  . Highest education level: Not on file  Occupational History  . Occupation: retired Education officer, [employer]  Social Needs  . Financial resource strain: Not on file  . Food insecurity:    Worry: Not on file    Inability: Not on file  . Transportation needs:    Medical: Not on file    Non-medical: Not on file  Tobacco Use  . Smoking status: Never Smoker  . Smokeless tobacco: Never Used  Substance and Sexual Activity  . Alcohol use: Not Currently    Alcohol/week: 0.0 standard drinks    Frequency: Never  . Drug use: Never  . Sexual activity: Not on file  Lifestyle  . Physical activity:    Days per week: Not on file    Minutes per session: Not on file  . Stress: Not on file  Relationships  . Social connections:    Talks on phone: Not on file    Gets together: Not on file    Attends religious service: Not on file    Active member of club or organization: Not on file    Attends meetings of clubs or organizations: Not on file    Relationship status: Not on file  Other Topics Concern  . Not on file  Social History Narrative  . Not on file     Review of Systems: A 12 point ROS discussed and pertinent positives are indicated in the HPI above.  All other systems are negative.  Review of Systems  Vital Signs: There were no vitals taken for this visit.  Physical Exam  Constitutional: She is oriented to person, place, and time. She appears well-developed and well-nourished.  HENT:  Head: Normocephalic.  Mouth/Throat: Oropharynx is clear and moist.  Neck: Normal range of motion. No JVD present. No  tracheal deviation present.  Cardiovascular: Normal rate, regular rhythm and normal heart sounds.  Pulmonary/Chest: Effort normal and breath sounds normal. No respiratory distress.  Neurological: She is alert and oriented to person, place, and time.  Skin: Skin is warm and dry.  Psychiatric: She has a normal mood and affect. Judgment normal.     Imaging: [place] Core Biopsy (soft Tissue)  Result Date: 02/01/2018 INDICATION: 67 year old with endometrial cancer and suspicious bilateral axillary lymphadenopathy. EXAM: RIGHT AXILLARY LYMPH NODE BIOPSY WITH ULTRASOUND GUIDANCE MEDICATIONS: None. ANESTHESIA/SEDATION: Moderate (conscious) sedation was employed during this procedure. A total of Versed 2.0 mg and Fentanyl 100 mcg was administered intravenously. Moderate Sedation Time: 22 minutes. The patient's level of consciousness and vital signs were monitored continuously by radiology nursing throughout the procedure  under my direct supervision. FLUOROSCOPY TIME:  None COMPLICATIONS: None immediate. PROCEDURE: Informed written consent was obtained from the patient after a thorough discussion of the procedural risks, benefits and alternatives. All questions were addressed. A timeout was performed prior to the initiation of the procedure. The axillary regions were evaluated with ultrasound. The right axillary lymph node was selected for biopsy. The right axillary region was prepped with chlorhexidine and sterile field was created. Skin and soft tissues were anesthetized with 1% lidocaine. 72 gauge core biopsy needle was directed into the right axillary lymph node with real-time ultrasound guidance. A total of 6 core biopsies were obtained. Specimens placed in saline. Bandage placed over the puncture site. FINDINGS: Round hypoechoic lymph node in the right axilla measuring 2.3 x 2.2 x 2.4 cm. No significant bleeding or hematoma formation following the core biopsies. Round hypoechoic left axillary lymph node measures  1.7 x 1.4 x 1.9 cm. IMPRESSION: Ultrasound-guided core biopsies of a suspicious right axillary lymph node. Electronically Signed   By: Markus Daft M.D.   On: 02/01/2018 15:55    Labs:  CBC: Recent Labs    01/18/18 0940 01/24/18 0435 02/01/18 1135 02/20/18 1242  WBC 4.1 7.9 5.6 5.0  HGB 10.9* 9.7* 11.6* 11.6*  HCT 34.4* 31.4* 38.2 37.0  PLT 329 250 372 282    COAGS: Recent Labs    02/01/18 1135 02/20/18 1242  INR 0.90 1.03  APTT 29 27    BMP: Recent Labs    12/12/17 1537  12/12/17 1759 12/12/17 2249 12/22/17 1505 01/18/18 0940 01/22/18 1109 01/24/18 0435  NA 132*  --  133* 138  --  143 139 141  K 4.0  --  3.7 3.0*  --  4.2 4.2 4.1  CL 93*  --  94* 102  --  110 105 109  CO2 22  --  22  --   --  '23 27 23  ' GLUCOSE 477*  --  454* 223*  --  199* 161* 164*  BUN 44*  --  44* 34*  --  21 21 25*  CALCIUM 11.0*  --  10.6*  --   --  10.0 10.4  10.4 9.8  CREATININE 2.30*   < > 1.83* 1.50* 1.90* 1.41* 1.17 1.41*  GFRNONAA 21*  --  27*  --   --  38*  --  38*  GFRAA 24*  --  32*  --   --  44*  --  44*   < > = values in this interval not displayed.    LIVER FUNCTION TESTS: Recent Labs    01/18/18 0940  BILITOT 0.5  AST 18  ALT 15  ALKPHOS 59  PROT 6.5  ALBUMIN 3.6    TUMOR MARKERS: No results for input(s): AFPTM, CEA, CA199, CHROMGRNA in the last 8760 hours.  Assessment and Plan: Metastatic endometrial cancer For port placement Labs pending Risks and benefits of image guided port-a-catheter placement was discussed with the patient including, but not limited to bleeding, infection, pneumothorax, or fibrin sheath development and need for additional procedures.  All of the patient's questions were answered, patient is agreeable to proceed. Consent signed and in chart.    Thank you for this interesting consult.  I greatly enjoyed meeting Krista Johnson and look forward to participating in their care.  A copy of this report was sent to the requesting provider on  this date.  Electronically Signed: Ascencion Dike, PA-C 02/20/2018, 1:35 PM   I spent a  total of 20 minutes in face to face in clinical consultation, greater than 50% of which was counseling/coordinating care for port

## 2018-02-20 NOTE — Procedures (Signed)
RIJV PAC SVC RA EBL 0 Comp 0 

## 2018-02-20 NOTE — Discharge Instructions (Signed)
Moderate Conscious Sedation, Adult, Care After °These instructions provide you with information about caring for yourself after your procedure. Your health care provider may also give you more specific instructions. Your treatment has been planned according to current medical practices, but problems sometimes occur. Call your health care provider if you have any problems or questions after your procedure. °What can I expect after the procedure? °After your procedure, it is common: °· To feel sleepy for several hours. °· To feel clumsy and have poor balance for several hours. °· To have poor judgment for several hours. °· To vomit if you eat too soon. ° °Follow these instructions at home: °For at least 24 hours after the procedure: ° °· Do not: °? Participate in activities where you could fall or become injured. °? Drive. °? Use heavy machinery. °? Drink alcohol. °? Take sleeping pills or medicines that cause drowsiness. °? Make important decisions or sign legal documents. °? Take care of children on your own. °· Rest. °Eating and drinking °· Follow the diet recommended by your health care provider. °· If you vomit: °? Drink water, juice, or soup when you can drink without vomiting. °? Make sure you have little or no nausea before eating solid foods. °General instructions °· Have a responsible adult stay with you until you are awake and alert. °· Take over-the-counter and prescription medicines only as told by your health care provider. °· If you smoke, do not smoke without supervision. °· Keep all follow-up visits as told by your health care provider. This is important. °Contact a health care provider if: °· You keep feeling nauseous or you keep vomiting. °· You feel light-headed. °· You develop a rash. °· You have a fever. °Get help right away if: °· You have trouble breathing. °This information is not intended to replace advice given to you by your health care provider. Make sure you discuss any questions you have  with your health care provider. °Document Released: 01/23/2013 Document Revised: 09/07/2015 Document Reviewed: 07/25/2015 °Elsevier Interactive Patient Education © 2018 [employer] ° ° °Implanted Port Insertion, Care After °This sheet gives you information about how to care for yourself after your procedure. Your health care provider may also give you more specific instructions. If you have problems or questions, contact your health care provider. °What can I expect after the procedure? °After your procedure, it is common to have: °· Discomfort at the port insertion site. °· Bruising on the skin over the port. This should improve over 3-4 days. ° °Follow these instructions at home: °Port care °· After your port is placed, you will get a manufacturer's information card. The card has information about your port. Keep this card with you at all times. °· Take care of the port as told by your health care provider. Ask your health care provider if you or a family member can get training for taking care of the port at home. A home health care nurse may also take care of the port. °· Make sure to remember what type of port you have. °Incision care °· Follow instructions from your health care provider about how to take care of your port insertion site. Make sure you: °? Wash your hands with soap and water before you change your bandage (dressing). If soap and water are not available, use hand sanitizer. °? Change your dressing as told by your health care provider.  You may remove your dressing tomorrow. °? Leave stitches (sutures), skin glue, or adhesive strips   in place. These skin closures may need to stay in place for 2 weeks or longer. If adhesive strip edges start to loosen and curl up, you may trim the loose edges. Do not remove adhesive strips completely unless your health care provider tells you to do that.  Do not use EMLA cream for 2 weeks after port placement as this cream will remove surgical glue on your  incision.  Check your port insertion site every day for signs of infection. Check for: ? More redness, swelling, or pain. ? More fluid or blood. ? Warmth. ? Pus or a bad smell. General instructions  Do not take baths, swim, or use a hot tub until your health care provider approves.  You may shower tomorrow.  Do not lift anything that is heavier than 10 lb (4.5 kg) for a week, or as told by your health care provider.  Ask your health care provider when it is okay to: ? Return to work or school. ? Resume usual physical activities or sports.  Do not drive for 24 hours if you were given a medicine to help you relax (sedative).  Take over-the-counter and prescription medicines only as told by your health care provider.  Wear a medical alert bracelet in case of an emergency. This will tell any health care providers that you have a port.  Keep all follow-up visits as told by your health care provider. This is important. Contact a health care provider if:  You cannot flush your port with saline as directed, or you cannot draw blood from the port.  You have a fever or chills.  You have more redness, swelling, or pain around your port insertion site.  You have more fluid or blood coming from your port insertion site.  Your port insertion site feels warm to the touch.  You have pus or a bad smell coming from the port insertion site. Get help right away if:  You have chest pain or shortness of breath.  You have bleeding from your port that you cannot control. Summary  Take care of the port as told by your health care provider.  Change your dressing as told by your health care provider.  Keep all follow-up visits as told by your health care provider. This information is not intended to replace advice given to you by your health care provider. Make sure you discuss any questions you have with your health care provider. Document Released: 01/23/2013 Document Revised: 02/24/2016  Document Reviewed: 02/24/2016 Elsevier Interactive Patient Education  2017 [employer].

## 2018-02-20 NOTE — Progress Notes (Signed)
Patient refused to have discharge instructions discussed with family.  Patient states she does not want any information shared with her sister and/or mother.  Discharge instructions reviewed with patient prior to procedure.  MD notified

## 2018-02-21 ENCOUNTER — Ambulatory Visit: Payer: Medicare Other | Admitting: Gynecologic Oncology

## 2018-02-21 ENCOUNTER — Encounter: Payer: Self-pay | Admitting: Oncology

## 2018-02-21 ENCOUNTER — Encounter: Payer: Self-pay | Admitting: Hematology and Oncology

## 2018-02-21 ENCOUNTER — Inpatient Hospital Stay: Payer: Medicare Other | Attending: Gynecology

## 2018-02-21 VITALS — BP 166/110 | HR 73 | Temp 97.6°F | Resp 18 | Wt 187.2 lb

## 2018-02-21 DIAGNOSIS — C786 Secondary malignant neoplasm of retroperitoneum and peritoneum: Secondary | ICD-10-CM | POA: Diagnosis not present

## 2018-02-21 DIAGNOSIS — C7961 Secondary malignant neoplasm of right ovary: Secondary | ICD-10-CM | POA: Insufficient documentation

## 2018-02-21 DIAGNOSIS — C7962 Secondary malignant neoplasm of left ovary: Secondary | ICD-10-CM | POA: Insufficient documentation

## 2018-02-21 DIAGNOSIS — Z90722 Acquired absence of ovaries, bilateral: Secondary | ICD-10-CM | POA: Insufficient documentation

## 2018-02-21 DIAGNOSIS — C541 Malignant neoplasm of endometrium: Secondary | ICD-10-CM | POA: Diagnosis not present

## 2018-02-21 DIAGNOSIS — Z90710 Acquired absence of both cervix and uterus: Secondary | ICD-10-CM | POA: Insufficient documentation

## 2018-02-21 DIAGNOSIS — C773 Secondary and unspecified malignant neoplasm of axilla and upper limb lymph nodes: Secondary | ICD-10-CM | POA: Insufficient documentation

## 2018-02-21 DIAGNOSIS — Z5111 Encounter for antineoplastic chemotherapy: Secondary | ICD-10-CM | POA: Insufficient documentation

## 2018-02-21 MED ORDER — SODIUM CHLORIDE 0.9 % IV SOLN
140.0000 mg/m2 | Freq: Once | INTRAVENOUS | Status: AC
  Administered 2018-02-21: 282 mg via INTRAVENOUS
  Filled 2018-02-21: qty 47

## 2018-02-21 MED ORDER — SODIUM CHLORIDE 0.9 % IV SOLN
20.0000 mg | Freq: Once | INTRAVENOUS | Status: AC
  Administered 2018-02-21: 20 mg via INTRAVENOUS
  Filled 2018-02-21: qty 2

## 2018-02-21 MED ORDER — SODIUM CHLORIDE 0.9 % IV SOLN
Freq: Once | INTRAVENOUS | Status: AC
  Administered 2018-02-21: 10:00:00 via INTRAVENOUS
  Filled 2018-02-21: qty 5

## 2018-02-21 MED ORDER — FAMOTIDINE IN NACL 20-0.9 MG/50ML-% IV SOLN: 20.0000 mg | Freq: Once | INTRAVENOUS | Status: DC

## 2018-02-21 MED ORDER — DIPHENHYDRAMINE HCL 50 MG/ML IJ SOLN
50.0000 mg | Freq: Once | INTRAMUSCULAR | Status: AC
  Administered 2018-02-21: 50 mg via INTRAVENOUS

## 2018-02-21 MED ORDER — HEPARIN SOD (PORK) LOCK FLUSH 100 UNIT/ML IV SOLN
500.0000 [IU] | Freq: Once | INTRAVENOUS | Status: AC | PRN
  Administered 2018-02-21: 500 [IU]
  Filled 2018-02-21: qty 5

## 2018-02-21 MED ORDER — PALONOSETRON HCL INJECTION 0.25 MG/5ML
0.2500 mg | Freq: Once | INTRAVENOUS | Status: AC
  Administered 2018-02-21: 0.25 mg via INTRAVENOUS

## 2018-02-21 MED ORDER — SODIUM CHLORIDE 0.9% FLUSH
10.0000 mL | INTRAVENOUS | Status: DC | PRN
  Administered 2018-02-21: 10 mL
  Filled 2018-02-21: qty 10

## 2018-02-21 MED ORDER — DIPHENHYDRAMINE HCL 50 MG/ML IJ SOLN
INTRAMUSCULAR | Status: AC
  Filled 2018-02-21: qty 1

## 2018-02-21 MED ORDER — PALONOSETRON HCL INJECTION 0.25 MG/5ML
INTRAVENOUS | Status: AC
  Filled 2018-02-21: qty 5

## 2018-02-21 MED ORDER — SODIUM CHLORIDE 0.9 % IV SOLN
470.0000 mg | Freq: Once | INTRAVENOUS | Status: AC
  Administered 2018-02-21: 470 mg via INTRAVENOUS
  Filled 2018-02-21: qty 47

## 2018-02-21 MED ORDER — SODIUM CHLORIDE 0.9 % IV SOLN
Freq: Once | INTRAVENOUS | Status: AC
  Administered 2018-02-21: 09:00:00 via INTRAVENOUS
  Filled 2018-02-21: qty 250

## 2018-02-21 NOTE — Patient Instructions (Signed)
[facility] Discharge Instructions for Patients Receiving Chemotherapy  Today you received the following chemotherapy agents Paclitaxel, Carboplatin  To help prevent nausea and vomiting after your treatment, we encourage you to take your nausea medication as directed   If you develop nausea and vomiting that is not controlled by your nausea medication, call the clinic.   BELOW ARE SYMPTOMS THAT SHOULD BE REPORTED IMMEDIATELY:  *FEVER GREATER THAN 100.5 F  *CHILLS WITH OR WITHOUT FEVER  NAUSEA AND VOMITING THAT IS NOT CONTROLLED WITH YOUR NAUSEA MEDICATION  *UNUSUAL SHORTNESS OF BREATH  *UNUSUAL BRUISING OR BLEEDING  TENDERNESS IN MOUTH AND THROAT WITH OR WITHOUT PRESENCE OF ULCERS  *URINARY PROBLEMS  *BOWEL PROBLEMS  UNUSUAL RASH Items with * indicate a potential emergency and should be followed up as soon as possible.  Feel free to call the clinic should you have any questions or concerns. The clinic phone number is [phone].  Please show the CHEMO ALERT CARD at check-in to the Emergency Department and triage nurse.   Paclitaxel injection What is this medicine? PACLITAXEL (PAK li TAX el) is a chemotherapy drug. It targets fast dividing cells, like cancer cells, and causes these cells to die. This medicine is used to treat ovarian cancer, breast cancer, and other cancers. This medicine may be used for other purposes; ask your health care provider or pharmacist if you have questions. COMMON BRAND NAME(S): Onxol, Taxol What should I tell my health care provider before I take this medicine? They need to know if you have any of these conditions: -blood disorders -irregular heartbeat -infection (especially a virus infection such as chickenpox, cold sores, or herpes) -liver disease -previous or ongoing radiation therapy -an unusual or allergic reaction to paclitaxel, alcohol, polyoxyethylated castor oil, other chemotherapy agents, other medicines, foods,  dyes, or preservatives -pregnant or trying to get pregnant -breast-feeding How should I use this medicine? This drug is given as an infusion into a vein. It is administered in a hospital or clinic by a specially trained health care professional. Talk to your pediatrician regarding the use of this medicine in children. Special care may be needed. Overdosage: If you think you have taken too much of this medicine contact a poison control center or emergency room at once. NOTE: This medicine is only for you. Do not share this medicine with others. What if I miss a dose? It is important not to miss your dose. Call your doctor or health care professional if you are unable to keep an appointment. What may interact with this medicine? Do not take this medicine with any of the following medications: -disulfiram -metronidazole This medicine may also interact with the following medications: -cyclosporine -diazepam -ketoconazole -medicines to increase blood counts like filgrastim, pegfilgrastim, sargramostim -other chemotherapy drugs like cisplatin, doxorubicin, epirubicin, etoposide, teniposide, vincristine -quinidine -testosterone -vaccines -verapamil Talk to your doctor or health care professional before taking any of these medicines: -acetaminophen -aspirin -ibuprofen -ketoprofen -naproxen This list may not describe all possible interactions. Give your health care provider a list of all the medicines, herbs, non-prescription drugs, or dietary supplements you use. Also tell them if you smoke, drink alcohol, or use illegal drugs. Some items may interact with your medicine. What should I watch for while using this medicine? Your condition will be monitored carefully while you are receiving this medicine. You will need important blood work done while you are taking this medicine. This medicine can cause serious allergic reactions. To reduce your risk you will need   to take other medicine(s)  before treatment with this medicine. If you experience allergic reactions like skin rash, itching or hives, swelling of the face, lips, or tongue, tell your doctor or health care professional right away. In some cases, you may be given additional medicines to help with side effects. Follow all directions for their use. This drug may make you feel generally unwell. This is not uncommon, as chemotherapy can affect healthy cells as well as cancer cells. Report any side effects. Continue your course of treatment even though you feel ill unless your doctor tells you to stop. Call your doctor or health care professional for advice if you get a fever, chills or sore throat, or other symptoms of a cold or flu. Do not treat yourself. This drug decreases your body's ability to fight infections. Try to avoid being around people who are sick. This medicine may increase your risk to bruise or bleed. Call your doctor or health care professional if you notice any unusual bleeding. Be careful brushing and flossing your teeth or using a toothpick because you may get an infection or bleed more easily. If you have any dental work done, tell your dentist you are receiving this medicine. Avoid taking products that contain aspirin, acetaminophen, ibuprofen, naproxen, or ketoprofen unless instructed by your doctor. These medicines may hide a fever. Do not become pregnant while taking this medicine. Women should inform their doctor if they wish to become pregnant or think they might be pregnant. There is a potential for serious side effects to an unborn child. Talk to your health care professional or pharmacist for more information. Do not breast-feed an infant while taking this medicine. Men are advised not to father a child while receiving this medicine. This product may contain alcohol. Ask your pharmacist or healthcare provider if this medicine contains alcohol. Be sure to tell all healthcare providers you are taking this  medicine. Certain medicines, like metronidazole and disulfiram, can cause an unpleasant reaction when taken with alcohol. The reaction includes flushing, headache, nausea, vomiting, sweating, and increased thirst. The reaction can last from 30 minutes to several hours. What side effects may I notice from receiving this medicine? Side effects that you should report to your doctor or health care professional as soon as possible: -allergic reactions like skin rash, itching or hives, swelling of the face, lips, or tongue -low blood counts - This drug may decrease the number of white blood cells, red blood cells and platelets. You may be at increased risk for infections and bleeding. -signs of infection - fever or chills, cough, sore throat, pain or difficulty passing urine -signs of decreased platelets or bleeding - bruising, pinpoint red spots on the skin, black, tarry stools, nosebleeds -signs of decreased red blood cells - unusually weak or tired, fainting spells, lightheadedness -breathing problems -chest pain -high or low blood pressure -mouth sores -nausea and vomiting -pain, swelling, redness or irritation at the injection site -pain, tingling, numbness in the hands or feet -slow or irregular heartbeat -swelling of the ankle, feet, hands Side effects that usually do not require medical attention (report to your doctor or health care professional if they continue or are bothersome): -bone pain -complete hair loss including hair on your head, underarms, pubic hair, eyebrows, and eyelashes -changes in the color of fingernails -diarrhea -loosening of the fingernails -loss of appetite -muscle or joint pain -red flush to skin -sweating This list may not describe all possible side effects. Call your doctor for medical advice   about side effects. You may report side effects to FDA at 1-800-FDA-1088. Where should I keep my medicine? This drug is given in a hospital or clinic and will not be  stored at home. NOTE: This sheet is a summary. It may not cover all possible information. If you have questions about this medicine, talk to your doctor, pharmacist, or health care provider.  2018 Elsevier/Gold Standard (2015-02-03 19:58:00)  Carboplatin injection What is this medicine? CARBOPLATIN (KAR boe pla tin) is a chemotherapy drug. It targets fast dividing cells, like cancer cells, and causes these cells to die. This medicine is used to treat ovarian cancer and many other cancers. This medicine may be used for other purposes; ask your health care provider or pharmacist if you have questions. COMMON BRAND NAME(S): Paraplatin What should I tell my health care provider before I take this medicine? They need to know if you have any of these conditions: -blood disorders -hearing problems -kidney disease -recent or ongoing radiation therapy -an unusual or allergic reaction to carboplatin, cisplatin, other chemotherapy, other medicines, foods, dyes, or preservatives -pregnant or trying to get pregnant -breast-feeding How should I use this medicine? This drug is usually given as an infusion into a vein. It is administered in a hospital or clinic by a specially trained health care professional. Talk to your pediatrician regarding the use of this medicine in children. Special care may be needed. Overdosage: If you think you have taken too much of this medicine contact a poison control center or emergency room at once. NOTE: This medicine is only for you. Do not share this medicine with others. What if I miss a dose? It is important not to miss a dose. Call your doctor or health care professional if you are unable to keep an appointment. What may interact with this medicine? -medicines for seizures -medicines to increase blood counts like filgrastim, pegfilgrastim, sargramostim -some antibiotics like amikacin, gentamicin, neomycin, streptomycin, tobramycin -vaccines Talk to your doctor or  health care professional before taking any of these medicines: -acetaminophen -aspirin -ibuprofen -ketoprofen -naproxen This list may not describe all possible interactions. Give your health care provider a list of all the medicines, herbs, non-prescription drugs, or dietary supplements you use. Also tell them if you smoke, drink alcohol, or use illegal drugs. Some items may interact with your medicine. What should I watch for while using this medicine? Your condition will be monitored carefully while you are receiving this medicine. You will need important blood work done while you are taking this medicine. This drug may make you feel generally unwell. This is not uncommon, as chemotherapy can affect healthy cells as well as cancer cells. Report any side effects. Continue your course of treatment even though you feel ill unless your doctor tells you to stop. In some cases, you may be given additional medicines to help with side effects. Follow all directions for their use. Call your doctor or health care professional for advice if you get a fever, chills or sore throat, or other symptoms of a cold or flu. Do not treat yourself. This drug decreases your body's ability to fight infections. Try to avoid being around people who are sick. This medicine may increase your risk to bruise or bleed. Call your doctor or health care professional if you notice any unusual bleeding. Be careful brushing and flossing your teeth or using a toothpick because you may get an infection or bleed more easily. If you have any dental work done, tell your dentist   you are receiving this medicine. Avoid taking products that contain aspirin, acetaminophen, ibuprofen, naproxen, or ketoprofen unless instructed by your doctor. These medicines may hide a fever. Do not become pregnant while taking this medicine. Women should inform their doctor if they wish to become pregnant or think they might be pregnant. There is a potential for  serious side effects to an unborn child. Talk to your health care professional or pharmacist for more information. Do not breast-feed an infant while taking this medicine. What side effects may I notice from receiving this medicine? Side effects that you should report to your doctor or health care professional as soon as possible: -allergic reactions like skin rash, itching or hives, swelling of the face, lips, or tongue -signs of infection - fever or chills, cough, sore throat, pain or difficulty passing urine -signs of decreased platelets or bleeding - bruising, pinpoint red spots on the skin, black, tarry stools, nosebleeds -signs of decreased red blood cells - unusually weak or tired, fainting spells, lightheadedness -breathing problems -changes in hearing -changes in vision -chest pain -high blood pressure -low blood counts - This drug may decrease the number of white blood cells, red blood cells and platelets. You may be at increased risk for infections and bleeding. -nausea and vomiting -pain, swelling, redness or irritation at the injection site -pain, tingling, numbness in the hands or feet -problems with balance, talking, walking -trouble passing urine or change in the amount of urine Side effects that usually do not require medical attention (report to your doctor or health care professional if they continue or are bothersome): -hair loss -loss of appetite -metallic taste in the mouth or changes in taste This list may not describe all possible side effects. Call your doctor for medical advice about side effects. You may report side effects to FDA at 1-800-FDA-1088. Where should I keep my medicine? This drug is given in a hospital or clinic and will not be stored at home. NOTE: This sheet is a summary. It may not cover all possible information. If you have questions about this medicine, talk to your doctor, pharmacist, or health care provider.  2018 Elsevier/Gold Standard  (2007-07-10 14:38:05)  

## 2018-02-21 NOTE — Progress Notes (Signed)
Went to infusion to introduce myself as Arboriculturist and to offer available resources.  Discussed the one-time $1000 [employer]. Patient interested in applying and will call me when able to provide information. She has my card for any additional financial questions or concerns.

## 2018-02-21 NOTE — Progress Notes (Signed)
Discharge instructions printed and verbally reviewed. Patient verbalized understanding. Denies questions or concerns at this time.

## 2018-02-22 ENCOUNTER — Encounter: Payer: Self-pay | Admitting: Hematology and Oncology

## 2018-02-22 ENCOUNTER — Telehealth: Payer: Self-pay

## 2018-02-22 ENCOUNTER — Ambulatory Visit (INDEPENDENT_AMBULATORY_CARE_PROVIDER_SITE_OTHER): Payer: Medicare Other | Admitting: Endocrinology

## 2018-02-22 ENCOUNTER — Encounter: Payer: Self-pay | Admitting: Endocrinology

## 2018-02-22 VITALS — BP 126/72 | HR 88 | Ht 66.0 in | Wt 187.6 lb

## 2018-02-22 DIAGNOSIS — Z794 Long term (current) use of insulin: Secondary | ICD-10-CM | POA: Diagnosis not present

## 2018-02-22 DIAGNOSIS — N183 Chronic kidney disease, stage 3 unspecified: Secondary | ICD-10-CM

## 2018-02-22 DIAGNOSIS — E1122 Type 2 diabetes mellitus with diabetic chronic kidney disease: Secondary | ICD-10-CM

## 2018-02-22 LAB — POCT GLYCOSYLATED HEMOGLOBIN (HGB A1C): Hemoglobin A1C: 7.7 % — AB (ref 4.0–5.6)

## 2018-02-22 MED ORDER — INSULIN ISOPHANE & REGULAR (HUMAN 70-30)100 UNIT/ML KWIKPEN: 30.0000 [IU] | PEN_INJECTOR | Freq: Every day | SUBCUTANEOUS | 11 refills | Status: DC

## 2018-02-22 NOTE — Progress Notes (Signed)
Patient called to inquire about financial assistance we discussed on the previous day.  Provided her with the answers to questions she had. Patient will bring proof of her income to meet with me on Mon 02/26/18. She has my card for any additional financial questions or concerns.

## 2018-02-22 NOTE — Telephone Encounter (Signed)
Called regarding first chemo treatment. She is doing well and having no problems. Instructed to call the office if needed. She verbalized understanding.

## 2018-02-22 NOTE — Telephone Encounter (Signed)
-----   Message from Smith Mince, [place] sent at 02/21/2018  4:09 PM EST ----- Regarding: Krista Johnson: First time chemo First time Taxol-Carbo. Patient tolerated well.

## 2018-02-22 NOTE — Patient Instructions (Addendum)
Please increase the insulin to 30 units each morning.   check your blood sugar twice a day.  vary the time of day when you check, between before the 3 meals, and at bedtime.  also check if you have symptoms of your blood sugar being too high or too low.  please keep a record of the readings and bring it to your next appointment here (or you can bring the meter itself).  You can write it on any piece of paper.  please call us sooner if your blood sugar goes below 70, or if you have a lot of readings over 200. On this type of insulin schedule, you should eat meals on a regular schedule.  If a meal is missed or significantly delayed, your blood sugar could go low.   Please come back for a follow-up appointment in 2 months.

## 2018-02-22 NOTE — Progress Notes (Signed)
Subjective:    Patient ID: Krista Johnson, female    DOB: 03/05/1951, 67 y.o.   MRN: [id number]  HPI Pt returns for f/u of diabetes mellitus: DM type: Insulin-requiring type 2.   Dx'ed: 2353 Complications: renal insuff Therapy: insulin since mid-2019 GDM: never (G0) DKA: never Severe hypoglycemia: never Pancreatitis: never Pancreatic imaging: normal on 2019 CT.   Other: she takes qd insulin, after poor results with multiple daily injections.   Interval history: She had 1st chemo dose yesterday, with decadron.  Pt says this did not affect cbg.  she brings her meter with her cbg's which I have reviewed today.  It varies from 80-200's.  It is in general higher as the day goes on.  Pt also has vit-D def and Hyperparathyroidism (prob a combination of primary and secondary).   Past Medical History:  Diagnosis Date  . Arthritis    right knee--- last cortisone injection 04/ 2019  . CKD (chronic kidney disease)   . Diabetes mellitus without complication [facility])    endocrinologist-  dr Loanne Drilling  . Endometrial cancer ([place])   . History of colon polyps   . Hyperlipidemia   . Hyperparathyroidism ([place])   . Hypertension     Past Surgical History:  Procedure Laterality Date  . COLONOSCOPY  last one 12-25-2017  . IR IMAGING GUIDED PORT INSERTION  02/20/2018  . ROBOTIC ASSISTED TOTAL HYSTERECTOMY WITH BILATERAL SALPINGO OOPHERECTOMY N/A 01/23/2018   Procedure: XI ROBOTIC ASSISTED TOTAL HYSTERECTOMY WITH BILATERAL SALPINGO OOPHORECTOMY;  Surgeon: Everitt Amber, MD;  Location: [facility] ORS;  Service: Gynecology;  Laterality: N/A;  . SENTINEL NODE BIOPSY N/A 01/23/2018   Procedure: SENTINEL NODE BIOPSY;  Surgeon: Everitt Amber, MD;  Location: [facility] OR[facility];  Service: Gynecology;  Laterality: N/A;    Social History   Socioeconomic History  . Marital status: Single    Spouse name: Not on file  . Number of children: 0  . Years of education: Not on file  . Highest education level: Not on file  Occupational  History  . Occupation: retired Education officer, museum  Social Needs  . Financial resource strain: Not on file  . Food insecurity:    Worry: Not on file    Inability: Not on file  . Transportation needs:    Medical: Not on file    Non-medical: Not on file  Tobacco Use  . Smoking status: Never Smoker  . Smokeless tobacco: Never Used  Substance and Sexual Activity  . Alcohol use: Not Currently    Alcohol/week: 0.0 standard drinks    Frequency: Never  . Drug use: Never  . Sexual activity: Not on file  Lifestyle  . Physical activity:    Days per week: Not on file    Minutes per session: Not on file  . Stress: Not on file  Relationships  . Social connections:    Talks on phone: Not on file    Gets together: Not on file    Attends religious service: Not on file    Active member of club or organization: Not on file    Attends meetings of clubs or organizations: Not on file    Relationship status: Not on file  . Intimate partner violence:    Fear of current or ex partner: Not on file    Emotionally abused: Not on file    Physically abused: Not on file    Forced sexual activity: Not on file  Other Topics Concern  . Not on file  Social  History Narrative  . Not on file    Current Outpatient Medications on File Prior to Visit  Medication Sig Dispense Refill  . blood glucose meter kit and supplies KIT Dispense based on patient and insurance preference. Use up to four times daily as directed. (FOR ICD-9 250.00, 250.01). 1 each 0  . dexamethasone (DECADRON) 4 MG tablet Take 2 tabs at the night before and 2 tabs the morning of chemotherapy, every 3 weeks, by mouth 24 tablet 0  . ketoconazole (NIZORAL) 2 % cream Apply to web space twice a day for 1 week then daily for 1 month (Patient taking differently: Apply 1 application topically daily as needed for irritation. Apply to web space twice a day for 1 week then daily for 1 month) 15 g 0  . lidocaine-prilocaine (EMLA) cream Apply to affected area  once 30 g 3  . lisinopril-hydrochlorothiazide (PRINZIDE,ZESTORETIC) 20-12.5 MG tablet Take 1 tablet by mouth daily. (Patient taking differently: Take 1 tablet by mouth every morning. ) 30 tablet 11  . ondansetron (ZOFRAN) 8 MG tablet Take 1 tablet (8 mg total) by mouth every 8 (eight) hours as needed for refractory nausea / vomiting. Start on day 3 after chemo. 30 tablet 1  . ONETOUCH DELICA LANCETS 69G MISC 3 (three) times daily. as directed  5  . ONETOUCH VERIO test strip 3 (three) times daily. as directed  5  . prochlorperazine (COMPAZINE) 10 MG tablet Take 1 tablet (10 mg total) by mouth every 6 (six) hours as needed (Nausea or vomiting). 30 tablet 1  . senna-docusate (SENOKOT-S) 8.6-50 MG tablet Take 1 tablet by mouth at bedtime as needed for mild constipation or moderate constipation. 20 tablet 0  . simvastatin (ZOCOR) 10 MG tablet Take 10 mg by mouth at bedtime.     . Vitamin D, Ergocalciferol, 2000 units CAPS Take 2,000 Units by mouth daily.   0   No current facility-administered medications on file prior to visit.     Allergies  Allergen Reactions  . Sulfa Antibiotics Nausea Only  . Sulfamethoxazole Nausea Only    Family History  Problem Relation Age of Onset  . Diabetes Mother   . Hypertension Mother   . Diabetes Sister   . Hypertension Sister   . Diabetes Maternal Uncle   . Colon cancer Paternal Aunt   . Diabetes Paternal Aunt   . Stomach cancer Neg Hx   . Rectal cancer Neg Hx   . Esophageal cancer Neg Hx   . Colon polyps Neg Hx     BP 126/72 (BP Location: Left Arm, Patient Position: Sitting, Cuff Size: Large)   Pulse 88   Ht '5\' 6"'  (1.676 m)   Wt 187 lb 9.6 oz (85.1 kg)   SpO2 94%   BMI 30.28 kg/m    Review of Systems She denies hypoglycemia.  She has lost a few lbs.      Objective:   Physical Exam VITAL SIGNS:  See vs page.  GENERAL: no distress.  Pulses: foot pulses are intact bilaterally.   MSK: no deformity of the feet or ankles.  CV: 1+ bilat edema  of the legs.   Skin:  no ulcer on the feet or ankles.  normal color and temp on the feet and ankles.   Neuro: sensation is intact to touch on the feet and ankles.     Lab Results  Component Value Date   CREATININE 1.40 (H) 02/20/2018   BUN 33 (H) 02/20/2018   NA 141  02/20/2018   K 3.5 02/20/2018   CL 107 02/20/2018   CO2 23 02/20/2018      Assessment & Plan:  Insulin-requiring type 2 DM: she needs increased rx Endometrial cancer.  We discussed poss effect of steroids on glycemic control.  She'll continue to monitor. Renal insuff: in this setting, she does ot need a PM insulin dose  Patient Instructions  Please increase the insulin to 30 units each morning.   check your blood sugar twice a day.  vary the time of day when you check, between before the 3 meals, and at bedtime.  also check if you have symptoms of your blood sugar being too high or too low.  please keep a record of the readings and bring it to your next appointment here (or you can bring the meter itself).  You can write it on any piece of paper.  please call us sooner if your blood sugar goes below 70, or if you have a lot of readings over 200. On this type of insulin schedule, you should eat meals on a regular schedule.  If a meal is missed or significantly delayed, your blood sugar could go low.   Please come back for a follow-up appointment in 2 months.

## 2018-02-23 ENCOUNTER — Ambulatory Visit: Payer: Medicare Other

## 2018-02-23 LAB — GLUCOSE, CAPILLARY: Glucose-Capillary: 42 mg/dL — CL (ref 70–99)

## 2018-02-26 ENCOUNTER — Telehealth: Payer: Self-pay | Admitting: Dietician

## 2018-02-26 ENCOUNTER — Encounter: Payer: Self-pay | Admitting: Hematology and Oncology

## 2018-02-26 ENCOUNTER — Ambulatory Visit: Payer: Medicare Other | Admitting: Endocrinology

## 2018-02-26 ENCOUNTER — Encounter: Payer: Medicare Other | Attending: Endocrinology | Admitting: Dietician

## 2018-02-26 ENCOUNTER — Encounter: Payer: Self-pay | Admitting: Dietician

## 2018-02-26 ENCOUNTER — Inpatient Hospital Stay: Payer: Medicare Other

## 2018-02-26 DIAGNOSIS — N183 Chronic kidney disease, stage 3 (moderate): Secondary | ICD-10-CM | POA: Diagnosis not present

## 2018-02-26 DIAGNOSIS — Z794 Long term (current) use of insulin: Secondary | ICD-10-CM | POA: Diagnosis not present

## 2018-02-26 DIAGNOSIS — E1165 Type 2 diabetes mellitus with hyperglycemia: Secondary | ICD-10-CM

## 2018-02-26 DIAGNOSIS — E1122 Type 2 diabetes mellitus with diabetic chronic kidney disease: Secondary | ICD-10-CM | POA: Insufficient documentation

## 2018-02-26 DIAGNOSIS — Z713 Dietary counseling and surveillance: Secondary | ICD-10-CM | POA: Insufficient documentation

## 2018-02-26 DIAGNOSIS — IMO0001 Reserved for inherently not codable concepts without codable children: Secondary | ICD-10-CM

## 2018-02-26 NOTE — Telephone Encounter (Signed)
Patient left her blood glucose meter and notebook in my office.  Called patient who will come to pick it up.  Antonieta Iba, RD, LDN, CDE

## 2018-02-26 NOTE — Patient Instructions (Signed)
Use meal plan. Eat consistent meals at consistent times. Walk as you feel able. Have nutritious easy to eat food available.  Aim for 3 Carb Choices per meal (45 grams) +/- 1 either way  Aim for 0-1 Carbs per snack if hungry  Include protein in moderation with your meals and snacks Consider reading food labels for Total Carbohydrate and Fat Grams of foods Consider  increasing your activity level by walking for 30 minutes daily as tolerated Continue checking BG at alternate times per day as directed by MD  Continue taking medication as directed by MD

## 2018-02-26 NOTE — Progress Notes (Signed)
Met with patient today whom brought me proof of income for the one-time $1000 [employer].  Patient approved. She has a copy of the approval letter and expense sheet as well as the Outpatient pharmacy information. We discussed the expenses and how they are paid. Patient submitted a bill to be paid today.    She has my card for any additional financial questions or concerns.

## 2018-02-26 NOTE — Progress Notes (Signed)
Diabetes Self-Management Education  Visit Type: First/Initial  Appt. Start Time: 0945 Appt. End Time: 4076  02/26/2018  [name], identified by name and date of birth, is a 67 y.o. female with a diagnosis of Diabetes: Type 2.   ASSESSMENT Today she would like to learn how much to eat with diabetes and cancer treatment.  Her appetite is fair.  Overall, she needs to increase her intake to include more calories.  Discussed that weight loss is not a goal at this time.    History includes HTN, HLD, stage 3 CKD, vitamin D deficiency, hyperparathyroidism, endometrial cancer on chemo with decadron. She is doing well with chemo.  No nausea but does have constipation. Labs include eGFR 44, BUN 33, Creatine 1.4, potassium 3.5, calcium 10.6, alk phos 67 02/20/18 Medications include:  Novolin 70/30- 30 units each am Usual weight 180-191 lbs.  Patient states that she has lost weight since chemo.  Patient lives alone.  Her 25 yo mother is staying with her sister but was with her prior to the illness.  Her sister cooks for her.  She is retired.  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 She walked for 13 years but stopped in August when she was found to have cancer.  4 times per week for 1 hour (2-3 miles).  She started back yesterday when she feels well.  Height _0  (1.676 m), weight 182 lb (82.6 kg). Body mass index is 29.38 kg/m.  Diabetes Self-Management Education - 02/26/18 0900      Visit Information   Visit Type  First/Initial      Initial Visit   Diabetes Type  Type 2    Are you currently following a meal plan?  Yes    What type of meal plan do you follow?  carb mod, lower sodium     Are you taking your medications as prescribed?  Yes    Date Diagnosed  2004   insulin since 2019     Health Coping   How would you rate your overall health?  Fair      Psychosocial Assessment   Patient Belief/Attitude  about Diabetes  Motivated to manage diabetes    Self-care barriers  Debilitated state due to current medical condition    Self-management support  Doctor's office;Friends;Family    Other persons present  Patient    Patient Concerns  Nutrition/Meal planning;Glycemic Control;Problem Solving    Special Needs  None    Preferred Learning Style  No preference indicated    Learning Readiness  Ready    How often do you need to have someone help you when you read instructions, pamphlets, or other written materials from your doctor or pharmacy?  1 - Never    What is the last grade level you completed in school?  >6 years college      Pre-Education Assessment   Patient understands the diabetes disease and treatment process.  Needs Review    Patient understands incorporating nutritional management into lifestyle.  Needs Review    Patient undertands incorporating physical activity into lifestyle.  Needs Review    Patient understands using medications safely.  Needs Review    Patient understands monitoring blood glucose, interpreting and using results  Needs Review    Patient understands prevention, detection, and treatment of acute complications.  Needs Review    Patient understands prevention, detection, and treatment of chronic complications.  Needs Review  Patient understands how to develop strategies to address psychosocial issues.  Needs Review    Patient understands how to develop strategies to promote health/change behavior.  Needs Review      Complications   Last HgB A1C per patient/outside source  7.7 %   02/22/18 increased from 6.9%   How often do you check your blood sugar?  1-2 times/day    Fasting Blood glucose range (mg/dL)  130-179    Postprandial Blood glucose range (mg/dL)  70-129;130-179;180-200;>200   higher since decadron   Number of hypoglycemic episodes per month  0    Number of hyperglycemic episodes per week  1    Can you tell when your blood sugar is high?  No    Have you  had a dilated eye exam in the past 12 months?  Yes    Have you had a dental exam in the past 12 months?  Yes    Are you checking your feet?  Yes    How many days per week are you checking your feet?  7      Dietary Intake   Breakfast  1/4 cup honey nut cheerios, 7 slices apples, 1/2 slice cheese toast or beef brat or [place] sausage OR white fish, 1/3 cup cauliflower casserole, 1/3 cup slaw OR scrambled eggs   8-9   Snack (morning)  yogurt or crackers or none    Lunch  2.5 ounces yogurt, 2 handfulls cinnamon granola yogurt, 1/2 cup skinny popcorn OR cheese toast OR spinach salad, chicken tender, collards    Snack (afternoon)  yogurt or crackers, occasional potato chips    Dinner  2 fried chicken wings, 3-4 potatoes OR cream of chicken soup with PB crackers    Snack (evening)  2 PB crackers    Beverage(s)  3-5 bottles of water daily, diet gingerale, diet tea      Exercise   Exercise Type  Light (walking / raking leaves);ADL's   has not been walking since August but walked for 13 years previouslyu   How many days per week to you exercise?  0    How many minutes per day do you exercise?  0    Total minutes per week of exercise  0      Patient Education   Previous Diabetes Education  Yes (please comment)   years ago   Disease state   Definition of diabetes, type 1 and 2, and the diagnosis of diabetes    Nutrition management   Role of diet in the treatment of diabetes and the relationship between the three main macronutrients and blood glucose level;Food label reading, portion sizes and measuring food.;Meal timing in regards to the patients' current diabetes medication.;Meal options for control of blood glucose level and chronic complications.    Physical activity and exercise   Helped patient identify appropriate exercises in relation to his/her diabetes, diabetes complications and other health issue.    Medications  Reviewed patients medication for diabetes, action, purpose, timing of dose  and side effects.;Reviewed medication adjustment guidelines for hyperglycemia and sick days.    Monitoring  Purpose and frequency of SMBG.;Identified appropriate SMBG and/or A1C goals.    Acute complications  Taught treatment of hypoglycemia - the 15 rule.    Psychosocial adjustment  Role of stress on diabetes;Identified and addressed patients feelings and concerns about diabetes      Individualized Goals (developed by patient)   Nutrition  General guidelines for healthy choices and portions discussed  Physical Activity  Exercise 3-5 times per week;30 minutes per day    Medications  take my medication as prescribed    Monitoring   test my blood glucose as discussed    Reducing Risk  examine blood glucose patterns;treat hypoglycemia with 15 grams of carbs if blood glucose less than 7m/dL    Health Coping  discuss diabetes with (comment)   MD, RD, CDE     Post-Education Assessment   Patient understands the diabetes disease and treatment process.  Demonstrates understanding / competency    Patient understands incorporating nutritional management into lifestyle.  Demonstrates understanding / competency    Patient undertands incorporating physical activity into lifestyle.  Demonstrates understanding / competency    Patient understands using medications safely.  Demonstrates understanding / competency    Patient understands monitoring blood glucose, interpreting and using results  Demonstrates understanding / competency    Patient understands prevention, detection, and treatment of acute complications.  Demonstrates understanding / competency    Patient understands prevention, detection, and treatment of chronic complications.  Demonstrates understanding / competency    Patient understands how to develop strategies to address psychosocial issues.  Demonstrates understanding / competency    Patient understands how to develop strategies to promote health/change behavior.  Demonstrates  understanding / competency      Outcomes   Expected Outcomes  Demonstrated interest in learning. Expect positive outcomes    Future DMSE  PRN    Program Status  Completed       Individualized Plan for Diabetes Self-Management Training:   Learning Objective:  Patient will have a greater understanding of diabetes self-management. Patient education plan is to attend individual and/or group sessions per assessed needs and concerns.   Plan:   There are no Patient Instructions on file for this visit.  Expected Outcomes:  Demonstrated interest in learning. Expect positive outcomes  Education material provided: ADA Diabetes: Your Take Control Guide, Meal plan card and Snack sheet  If problems or questions, patient to contact team via:  Phone  Future DSME appointment: PRN

## 2018-03-01 ENCOUNTER — Telehealth: Payer: Self-pay | Admitting: *Deleted

## 2018-03-05 ENCOUNTER — Telehealth: Payer: Self-pay | Admitting: Oncology

## 2018-03-05 NOTE — Telephone Encounter (Signed)
Krista Johnson called and wanted to verify when her radiation appointments will start.  Discussed that they will be scheduled to start after her second round of chemo on 03/19/2018.

## 2018-03-06 ENCOUNTER — Telehealth: Payer: Self-pay | Admitting: *Deleted

## 2018-03-06 NOTE — Telephone Encounter (Signed)
CALLED PATIENT TO INFORM OF NEW HDR [facility], SPOKE WITH PATIENT AND SHE IS AWARE OF THESE APPTS. °

## 2018-03-16 ENCOUNTER — Other Ambulatory Visit: Payer: Medicare Other

## 2018-03-16 ENCOUNTER — Inpatient Hospital Stay: Payer: Medicare Other

## 2018-03-16 DIAGNOSIS — C541 Malignant neoplasm of endometrium: Secondary | ICD-10-CM

## 2018-03-16 DIAGNOSIS — C7962 Secondary malignant neoplasm of left ovary: Secondary | ICD-10-CM | POA: Diagnosis not present

## 2018-03-16 DIAGNOSIS — C773 Secondary and unspecified malignant neoplasm of axilla and upper limb lymph nodes: Secondary | ICD-10-CM | POA: Diagnosis not present

## 2018-03-16 DIAGNOSIS — Z5111 Encounter for antineoplastic chemotherapy: Secondary | ICD-10-CM | POA: Diagnosis not present

## 2018-03-16 DIAGNOSIS — C786 Secondary malignant neoplasm of retroperitoneum and peritoneum: Secondary | ICD-10-CM | POA: Diagnosis not present

## 2018-03-16 DIAGNOSIS — C7961 Secondary malignant neoplasm of right ovary: Secondary | ICD-10-CM | POA: Diagnosis not present

## 2018-03-16 LAB — CMP (CANCER CENTER ONLY)
ALT: 14 U/L (ref 0–44)
AST: 17 U/L (ref 15–41)
Albumin: 3.4 g/dL — ABNORMAL LOW (ref 3.5–5.0)
Alkaline Phosphatase: 92 U/L (ref 38–126)
Anion gap: 9 (ref 5–15)
BUN: 24 mg/dL — ABNORMAL HIGH (ref 8–23)
CO2: 23 mmol/L (ref 22–32)
Calcium: 9.9 mg/dL (ref 8.9–10.3)
Chloride: 107 mmol/L (ref 98–111)
Creatinine: 1.21 mg/dL — ABNORMAL HIGH (ref 0.44–1.00)
GFR, Est AFR Am: 54 mL/min — ABNORMAL LOW (ref >60)
GFR, Estimated: 46 mL/min — ABNORMAL LOW (ref >60)
Glucose, Bld: 182 mg/dL — ABNORMAL HIGH (ref 70–99)
Potassium: 3.9 mmol/L (ref 3.5–5.1)
Sodium: 139 mmol/L (ref 135–145)
Total Bilirubin: 0.2 mg/dL — ABNORMAL LOW (ref 0.3–1.2)
Total Protein: 6.9 g/dL (ref 6.5–8.1)

## 2018-03-16 LAB — CBC WITH DIFFERENTIAL (CANCER CENTER ONLY)
Abs Immature Granulocytes: 0.03 10*3/uL (ref 0.00–0.07)
Basophils Absolute: 0 10*3/uL (ref 0.0–0.1)
Basophils Relative: 0 %
Eosinophils Absolute: 0.1 10*3/uL (ref 0.0–0.5)
Eosinophils Relative: 1 %
HCT: 31.8 % — ABNORMAL LOW (ref 36.0–46.0)
Hemoglobin: 10.3 g/dL — ABNORMAL LOW (ref 12.0–15.0)
Immature Granulocytes: 1 %
Lymphocytes Relative: 35 %
Lymphs Abs: 1.8 10*3/uL (ref 0.7–4.0)
MCH: 27.3 pg (ref 26.0–34.0)
MCHC: 32.4 g/dL (ref 30.0–36.0)
MCV: 84.4 fL (ref 80.0–100.0)
Monocytes Absolute: 0.3 10*3/uL (ref 0.1–1.0)
Monocytes Relative: 5 %
Neutro Abs: 3 10*3/uL (ref 1.7–7.7)
Neutrophils Relative %: 58 %
Platelet Count: 136 10*3/uL — ABNORMAL LOW (ref 150–400)
RBC: 3.77 MIL/uL — ABNORMAL LOW (ref 3.87–5.11)
RDW: 12.9 % (ref 11.5–15.5)
WBC Count: 5.2 10*3/uL (ref 4.0–10.5)
nRBC: 0 % (ref 0.0–0.2)

## 2018-03-16 MED ORDER — SODIUM CHLORIDE 0.9% FLUSH
10.0000 mL | Freq: Once | INTRAVENOUS | Status: AC
  Administered 2018-03-16: 10 mL
  Filled 2018-03-16: qty 10

## 2018-03-19 ENCOUNTER — Telehealth: Payer: Self-pay | Admitting: Hematology and Oncology

## 2018-03-19 ENCOUNTER — Encounter: Payer: Self-pay | Admitting: Hematology and Oncology

## 2018-03-19 ENCOUNTER — Inpatient Hospital Stay: Payer: Medicare Other | Attending: Gynecology

## 2018-03-19 ENCOUNTER — Inpatient Hospital Stay (HOSPITAL_BASED_OUTPATIENT_CLINIC_OR_DEPARTMENT_OTHER): Payer: Medicare Other | Admitting: Hematology and Oncology

## 2018-03-19 VITALS — HR 100

## 2018-03-19 DIAGNOSIS — E1165 Type 2 diabetes mellitus with hyperglycemia: Secondary | ICD-10-CM

## 2018-03-19 DIAGNOSIS — Z79899 Other long term (current) drug therapy: Secondary | ICD-10-CM

## 2018-03-19 DIAGNOSIS — C541 Malignant neoplasm of endometrium: Secondary | ICD-10-CM | POA: Diagnosis not present

## 2018-03-19 DIAGNOSIS — Z90722 Acquired absence of ovaries, bilateral: Secondary | ICD-10-CM | POA: Insufficient documentation

## 2018-03-19 DIAGNOSIS — T451X5S Adverse effect of antineoplastic and immunosuppressive drugs, sequela: Secondary | ICD-10-CM | POA: Diagnosis not present

## 2018-03-19 DIAGNOSIS — Z5111 Encounter for antineoplastic chemotherapy: Secondary | ICD-10-CM | POA: Diagnosis not present

## 2018-03-19 DIAGNOSIS — C773 Secondary and unspecified malignant neoplasm of axilla and upper limb lymph nodes: Secondary | ICD-10-CM | POA: Diagnosis not present

## 2018-03-19 DIAGNOSIS — D61818 Other pancytopenia: Secondary | ICD-10-CM | POA: Insufficient documentation

## 2018-03-19 DIAGNOSIS — E1122 Type 2 diabetes mellitus with diabetic chronic kidney disease: Secondary | ICD-10-CM | POA: Diagnosis not present

## 2018-03-19 DIAGNOSIS — K5909 Other constipation: Secondary | ICD-10-CM | POA: Insufficient documentation

## 2018-03-19 DIAGNOSIS — C786 Secondary malignant neoplasm of retroperitoneum and peritoneum: Secondary | ICD-10-CM

## 2018-03-19 DIAGNOSIS — N183 Chronic kidney disease, stage 3 unspecified: Secondary | ICD-10-CM

## 2018-03-19 DIAGNOSIS — Z794 Long term (current) use of insulin: Secondary | ICD-10-CM

## 2018-03-19 DIAGNOSIS — Z90710 Acquired absence of both cervix and uterus: Secondary | ICD-10-CM | POA: Insufficient documentation

## 2018-03-19 DIAGNOSIS — D61810 Antineoplastic chemotherapy induced pancytopenia: Secondary | ICD-10-CM | POA: Insufficient documentation

## 2018-03-19 DIAGNOSIS — IMO0001 Reserved for inherently not codable concepts without codable children: Secondary | ICD-10-CM

## 2018-03-19 MED ORDER — SODIUM CHLORIDE 0.9 % IV SOLN
140.0000 mg/m2 | Freq: Once | INTRAVENOUS | Status: AC
  Administered 2018-03-19: 282 mg via INTRAVENOUS
  Filled 2018-03-19: qty 47

## 2018-03-19 MED ORDER — SODIUM CHLORIDE 0.9 % IV SOLN
516.0000 mg | Freq: Once | INTRAVENOUS | Status: AC
  Administered 2018-03-19: 520 mg via INTRAVENOUS
  Filled 2018-03-19: qty 52

## 2018-03-19 MED ORDER — HEPARIN SOD (PORK) LOCK FLUSH 100 UNIT/ML IV SOLN
500.0000 [IU] | Freq: Once | INTRAVENOUS | Status: AC | PRN
  Administered 2018-03-19: 500 [IU]
  Filled 2018-03-19: qty 5

## 2018-03-19 MED ORDER — FAMOTIDINE IN NACL 20-0.9 MG/50ML-% IV SOLN
20.0000 mg | Freq: Once | INTRAVENOUS | Status: AC
  Administered 2018-03-19: 20 mg via INTRAVENOUS

## 2018-03-19 MED ORDER — DIPHENHYDRAMINE HCL 50 MG/ML IJ SOLN
50.0000 mg | Freq: Once | INTRAMUSCULAR | Status: AC
  Administered 2018-03-19: 50 mg via INTRAVENOUS

## 2018-03-19 MED ORDER — FAMOTIDINE IN NACL 20-0.9 MG/50ML-% IV SOLN
INTRAVENOUS | Status: AC
  Filled 2018-03-19: qty 50

## 2018-03-19 MED ORDER — PALONOSETRON HCL INJECTION 0.25 MG/5ML
INTRAVENOUS | Status: AC
  Filled 2018-03-19: qty 5

## 2018-03-19 MED ORDER — SODIUM CHLORIDE 0.9% FLUSH
10.0000 mL | INTRAVENOUS | Status: DC | PRN
  Administered 2018-03-19: 10 mL
  Filled 2018-03-19: qty 10

## 2018-03-19 MED ORDER — SODIUM CHLORIDE 0.9 % IV SOLN
Freq: Once | INTRAVENOUS | Status: AC
  Administered 2018-03-19: 10:00:00 via INTRAVENOUS
  Filled 2018-03-19: qty 250

## 2018-03-19 MED ORDER — DIPHENHYDRAMINE HCL 50 MG/ML IJ SOLN
INTRAMUSCULAR | Status: AC
  Filled 2018-03-19: qty 1

## 2018-03-19 MED ORDER — PALONOSETRON HCL INJECTION 0.25 MG/5ML
0.2500 mg | Freq: Once | INTRAVENOUS | Status: AC
  Administered 2018-03-19: 0.25 mg via INTRAVENOUS

## 2018-03-19 MED ORDER — SODIUM CHLORIDE 0.9 % IV SOLN
Freq: Once | INTRAVENOUS | Status: AC
  Administered 2018-03-19: 11:00:00 via INTRAVENOUS
  Filled 2018-03-19: qty 5

## 2018-03-19 NOTE — Assessment & Plan Note (Signed)
She has stable mild pancytopenia.  We will continue treatment without dose adjustment

## 2018-03-19 NOTE — Telephone Encounter (Signed)
Per 12/2 los, made appts.

## 2018-03-19 NOTE — Assessment & Plan Note (Signed)
Likely due to side effects of medications We discussed laxative therapy

## 2018-03-19 NOTE — Progress Notes (Signed)
[place] OFFICE PROGRESS NOTE  Patient Care Team: Jonathon Jordan, MD as PCP - General (Family Medicine)  ASSESSMENT & PLAN:  Endometrial cancer [facility]) She tolerated treatment very well without major side effects except for some mild constipation, weight gain and mild hyperglycemia We will proceed with treatment without dose adjustment Plan repeat imaging study after 3 cycles of chemotherapy  Chronic kidney disease (CKD), stage III (moderate) ([place]) She had recent stable mild chronic renal failure We discussed importance of adequate hydration  Diabetes mellitus, insulin dependent (IDDM), uncontrolled ([place]) She has poorly controlled diabetes I will continue on reduced dose of dexamethasone We discussed importance of dietary modification while on treatment  Pancytopenia, acquired ([place]) She has stable mild pancytopenia.  We will continue treatment without dose adjustment  Other constipation Likely due to side effects of medications We discussed laxative therapy   No orders of the defined types were placed in this encounter.   INTERVAL HISTORY: Please see below for problem oriented charting. 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 Leo Grosser who obtained an endometrial biopsy showing poorly differentiated endometrial carcinoma and negative endocervical curettage    12/12/2017 Imaging    MAMMOGRAM FINDINGS: In the right  axilla, a possible mass warrants further evaluation. In the left breast, no findings suspicious for malignancy.  Images were processed with CAD.  IMPRESSION: Further evaluation is suggested for possible mass in the right axilla.     12/24/2017 Imaging    Ct scan chest, abdomen and pelvis 1. Marked thickening of the endometrium (42 mm) compatible with known primary endometrial malignancy. No evidence of extrauterine invasion. 2. No pelvic or retroperitoneal adenopathy. 3. Right middle lobe 4 mm solid pulmonary nodule, for which follow-up chest CT is advised in 3-6 months. 4. Several findings that are equivocal for distant metastatic disease. Vaguely nodular heterogeneous hyperenhancement in the peripheral right liver lobe, which could represent benign transient perfusional phenomena, with underlying liver lesions not entirely excluded. Mildly sclerotic T12 vertebral lesion. Mildly enlarged right axillary lymph node. The best single test to further evaluate these findings would be a PET-CT. Alternative tests include bone scan or thoracic MRI without and with IV contrast for the T12 osseous lesion, MRI abdomen without and with IV contrast for the liver findings, and diagnostic mammographic evaluation for the right axillary node. 5.  Aortic Atherosclerosis (ICD10-I70.0).     01/09/2018 PET scan    1. Moderate hypermetabolism corresponding to enlarging axillary node since 12/22/2017. Highly suspicious for an atypical distribution of metastatic disease. 2. No hypermetabolism to suggest hepatic or T12 osseous metastasis. 3. Hypermetabolic endometrial primary.    01/23/2018 Initial Diagnosis    Endometrial cancer ([place])    01/23/2018 Pathology Results    1. Lymph node, sentinel, biopsy, left external iliac - NO CARCINOMA IDENTIFIED IN ONE LYMPH NODE (0/1) - SEE COMMENT 2. Lymph nodes, regional resection, right para aortic - NO CARCINOMA IDENTIFIED IN FOUR LYMPH NODES (0/4) - SEE COMMENT 3.  Lymph nodes,  regional resection, right pelvic - NO CARCINOMA IDENTIFIED IN EIGHT LYMPH NODES (0/8) - SEE COMMENT 4. Cul-de-sac biopsy - METASTATIC CARCINOMA 5. Uterus +/- tubes/ovaries, neoplastic, cervix, bilateral fallopian tubes and ovaries UTERUS: - MIXED SEROUS AND ENDOMETRIOID CARCINOMA - SEROSAL IMPLANTS PRESENT - LYMPHOVASCULAR SPACE INVASION PRESENT - LEIOMYOMATA (1.5 CM; LARGEST) - SEE ONCOLOGY TABLE AND COMMENT BELOW CERVIX: - BENIGN NABOTHIAN CYSTS - NO CARCINOMA IDENTIFIED BILATERAL OVARIES: - METASTATIC CARCINOMA PRESENT ON OVARIAN SURFACE BILATERAL FALLOPIAN TUBES: - INTRALUMINAL CARCINOMA Microscopic Comment 1. -3. Cytokeratin AE1/3 was performed on the sentinel lymph nodes to exclude micrometastasis. There is no evidence of metastatic carcinoma by immunohistochemistry. 5. 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%) Histologic Grade: N/A Myometrial invasion: Estimated less than 50% myometrial invasion (0.3 cm of myometrium involved; 1.4 cm measured thickness) Uterine Serosa Involvement: Present Cervical stromal involvement: Not identified Extent of involvement of other organs: - Fallopian tube (left within the lumen) - Ovary, left (surface involvement) - Cul-de-sac Lymphovascular invasion: Present Regional Lymph Nodes: Examined: 1 Sentinel 12 Non-sentinel 13 Total Lymph nodes with metastasis: 0 Isolated tumor cells (< 0.2 mm): 0 Micrometastasis: (> 0.2 mm and < 2.0 mm): 0 Macrometastasis: (> 2.0 mm): 0 Extracapsular extension: N/A Tumor block for ancillary studies: 5E, 5B MMR / MSI testing: Pending will be reported separately Pathologic Stage Classification (pTNM, AJCC 8th edition): pT3a, pN0 FIGO Stage: IIIA COMMENT: There  is tumor present on the surface of the left ovary and within the lumen of the left fallopian tube. The carcinoma appears to be mixed with the largest component being high grade serous carcinoma. Dr. Lyndon Code reviewed the case and agrees with the above diagnosis.    01/23/2018 Surgery    Surgeon: Donaciano Eva   Operation: Robotic-assisted laparoscopic total hysterectomy with bilateral salpingoophorectomy, SLN injection, mapping and biopsy, right pelvic and para-aortic lymphadenectomy  Operative Findings:  : 10-12cm bulky uterus with frank serosal involvement on posterior cul de sac peritoneum and anterior peritoneum which adhesed the bladder to the anterior uterus. Unilateral mapping on left pelvis. No grossly suspicious nodes. Normal omentum and diaphragms.     02/01/2018 Pathology Results    Lymph node, needle/core biopsy, right axilla - METASTATIC CARCINOMA - SEE COMMENT Microscopic Comment The neoplastic cells are positive for cytokeratin 7 and Pax-8 but negative for cytokeratin 5/6, cytokeratin 20, Gata-3, p63, p53 and GCDFP. Overall, the immunoprofile is consistent with metastasis from the patient's known gynecologic carcinoma.    02/01/2018 Procedure    Ultrasound-guided core biopsies of a suspicious right axillary lymph node.    02/21/2018 -  Chemotherapy    The patient had carboplatin & Taxol     REVIEW OF SYSTEMS:   Constitutional: Denies fevers, chills or abnormal weight loss Eyes: Denies blurriness of vision Ears, nose, mouth, throat, and face: Denies mucositis or sore throat Respiratory: Denies cough, dyspnea or wheezes Cardiovascular: Denies palpitation, chest discomfort or lower extremity swelling Skin: Denies abnormal skin rashes Lymphatics: Denies new lymphadenopathy or easy bruising Neurological:Denies numbness, tingling or new weaknesses Behavioral/Psych: Mood is stable, no new changes  All other systems were reviewed with the patient and are negative.  I  have reviewed the past medical history, past surgical history, social history and family history with the patient and they are unchanged from previous note.  ALLERGIES:  is allergic to sulfa antibiotics and sulfamethoxazole.  MEDICATIONS:  Current Outpatient Medications  Medication Sig Dispense Refill  . blood glucose meter kit and supplies KIT Dispense based on patient and insurance preference. Use up to four times daily as directed. (FOR ICD-9 250.00, 250.01). 1 each 0  . dexamethasone (DECADRON) 4 MG tablet Take 2 tabs at the night before and 2 tabs the morning of chemotherapy, every 3 weeks, by mouth 24 tablet 0  . Insulin Isophane & Regular Human (NOVOLIN 70/30 FLEXPEN) (70-30) 100 UNIT/ML PEN Inject 30 Units into the skin daily with breakfast. and pen needles 1/day 15 mL 11  . ketoconazole (NIZORAL) 2 % cream Apply to web space twice a day for 1 week then daily for 1 month (Patient taking differently: Apply 1 application topically daily as needed for irritation. Apply to web space twice a day for 1 week then daily for 1 month) 15 g 0  . lidocaine-prilocaine (EMLA) cream Apply to affected area once 30 g 3  . lisinopril-hydrochlorothiazide (PRINZIDE,ZESTORETIC) 20-12.5 MG tablet Take 1 tablet by mouth daily. (Patient taking differently: Take 1 tablet by mouth every morning. ) 30 tablet 11  . ondansetron (ZOFRAN) 8 MG tablet Take 1 tablet (8 mg total) by mouth every 8 (eight) hours as needed for refractory nausea / vomiting. Start on day 3 after chemo. (Patient not taking: Reported on 02/26/2018) 30 tablet 1  . ONETOUCH DELICA LANCETS 14H MISC 3 (three) times daily. as directed  5  . ONETOUCH VERIO test strip 3 (three) times daily. as directed  5  . prochlorperazine (COMPAZINE) 10 MG tablet Take 1 tablet (10 mg total) by mouth every 6 (six) hours as needed (Nausea or vomiting). (Patient not taking: Reported on 02/26/2018) 30 tablet 1  . senna-docusate (SENOKOT-S) 8.6-50 MG tablet Take 1 tablet by  mouth at bedtime as needed for mild constipation or moderate constipation. 20 tablet 0  . simvastatin (ZOCOR) 10 MG tablet Take 10 mg by mouth at bedtime.     . Vitamin D, Ergocalciferol, 2000 units CAPS Take 2,000 Units by mouth daily.   0   No current facility-administered medications for this visit.     PHYSICAL EXAMINATION: ECOG PERFORMANCE STATUS: 1 - Symptomatic but completely ambulatory  Vitals:   03/19/18 0856  BP: (!) 145/88  Pulse: (!) 109  Resp: 18  Temp: 97.7 F (36.5 C)  SpO2: 100%   Filed Weights   03/19/18 0856  Weight: 192 lb 12.8 oz (87.5 kg)    GENERAL:alert, no distress and comfortable SKIN: skin color, texture, turgor are normal, no rashes or significant lesions EYES: normal, Conjunctiva are pink and non-injected, sclera clear OROPHARYNX:no exudate, no erythema and lips, buccal mucosa, and tongue normal  NECK: supple, thyroid normal size, non-tender, without nodularity LYMPH:  no palpable lymphadenopathy in the cervical, axillary or inguinal LUNGS: clear to auscultation and percussion with normal breathing effort HEART: regular rate & rhythm and no murmurs and no lower extremity edema ABDOMEN:abdomen soft, non-tender and normal bowel sounds Musculoskeletal:no cyanosis of digits and no clubbing  NEURO: alert & oriented x 3 with fluent speech, no focal motor/sensory deficits  LABORATORY DATA:  I have reviewed the data as listed    Component Value Date/Time   NA 139 03/16/2018 0747   K 3.9 03/16/2018 0747   CL 107 03/16/2018 0747   CO2 23 03/16/2018 0747   GLUCOSE 182 (H) 03/16/2018 0747   BUN 24 (H) 03/16/2018 0747   CREATININE 1.21 (H) 03/16/2018 0747   CALCIUM 9.9 03/16/2018 0747  PROT 6.9 03/16/2018 0747   ALBUMIN 3.4 (L) 03/16/2018 0747   AST 17 03/16/2018 0747   ALT 14 03/16/2018 0747   ALKPHOS 92 03/16/2018 0747   BILITOT 0.2 (L) 03/16/2018 0747   GFRNONAA 46 (L) 03/16/2018 0747   GFRAA 54 (L) 03/16/2018 0747    No results found for:  SPEP, UPEP  Lab Results  Component Value Date   WBC 5.2 03/16/2018   NEUTROABS 3.0 03/16/2018   HGB 10.3 (L) 03/16/2018   HCT 31.8 (L) 03/16/2018   MCV 84.4 03/16/2018   PLT 136 (L) 03/16/2018      Chemistry      Component Value Date/Time   NA 139 03/16/2018 0747   K 3.9 03/16/2018 0747   CL 107 03/16/2018 0747   CO2 23 03/16/2018 0747   BUN 24 (H) 03/16/2018 0747   CREATININE 1.21 (H) 03/16/2018 0747      Component Value Date/Time   CALCIUM 9.9 03/16/2018 0747   ALKPHOS 92 03/16/2018 0747   AST 17 03/16/2018 0747   ALT 14 03/16/2018 0747   BILITOT 0.2 (L) 03/16/2018 0747       RADIOGRAPHIC STUDIES: I have personally reviewed the radiological images as listed and agreed with the findings in the report. Ir Imaging Guided Port Insertion  Result Date: 02/20/2018 CLINICAL DATA:  Endometrial carcinoma EXAM: TUNNEL POWER PORT PLACEMENT WITH SUBCUTANEOUS POCKET UTILIZING ULTRASOUND & FLOUROSCOPY FLUOROSCOPY TIME:  30 seconds.  3.2 mGy. MEDICATIONS AND MEDICAL HISTORY: Versed 4 mg, Fentanyl 100 mcg. Additional Medications: 2 g Ancef. Antibiotics were given within 2 hours of the procedure. ANESTHESIA/SEDATION: Moderate sedation time: 31 minutes. Nursing monitored the the patient during the procedure. PROCEDURE: After written informed consent was obtained, patient was placed in the supine position on angiographic table. The right neck and chest was prepped and draped in a sterile fashion. Lidocaine was utilized for local anesthesia. The right jugular vein was noted to be patent initially with ultrasound. Under sonographic guidance, a micropuncture needle was inserted into the right IJ vein (Ultrasound and fluoroscopic image documentation was performed). The needle was removed over an 018 wire which was exchanged for a Amplatz. This was advanced into the IVC. An 8-French dilator was advanced over the Amplatz. A small incision was made in the right upper chest over the anterior right second  rib. Utilizing blunt dissection, a subcutaneous pocket was created in the caudal direction. The pocket was irrigated with a copious amount of sterile normal saline. The port catheter was tunneled from the chest incision, and out the neck incision. The reservoir was inserted into the subcutaneous pocket and secured with two 3-0 Ethilon stitches. A peel-away sheath was advanced over the Amplatz wire. The port catheter was cut to measure length and inserted through the peel-away sheath. The peel-away sheath was removed. The chest incision was closed with 3-0 Vicryl interrupted stitches for the subcutaneous tissue and a running of 4-0 Vicryl subcuticular stitch for the skin. The neck incision was closed with a 4-0 Vicryl subcuticular stitch. Derma-bond was applied to both surgical incisions. The port reservoir was flushed and instilled with heparinized saline. No complications. FINDINGS: A right IJ vein Port-A-Cath is in place with its tip at the cavoatrial junction. COMPLICATIONS: None IMPRESSION: Successful 8 French right internal jugular vein power port placement with its tip at the SVC/RA junction. Electronically Signed   By: Marybelle Killings M.D.   On: 02/20/2018 15:39    All questions were answered. The patient knows to call the  clinic with any problems, questions or concerns. No barriers to learning was detected.  I spent 15 minutes counseling the patient face to face. The total time spent in the appointment was 20 minutes and more than 50% was on counseling and review of test results  [name], MD 03/19/2018 9:27 AM

## 2018-03-19 NOTE — Assessment & Plan Note (Signed)
She tolerated treatment very well without major side effects except for some mild constipation, weight gain and mild hyperglycemia We will proceed with treatment without dose adjustment Plan repeat imaging study after 3 cycles of chemotherapy

## 2018-03-19 NOTE — Patient Instructions (Signed)
[facility] Discharge Instructions for Patients Receiving Chemotherapy  Today you received the following chemotherapy agents Paclitaxel (TAXOL) & Carboplatin (PARAPLATIN).  To help prevent nausea and vomiting after your treatment, we encourage you to take your nausea medication as prescribed.  If you develop nausea and vomiting that is not controlled by your nausea medication, call the clinic.   BELOW ARE SYMPTOMS THAT SHOULD BE REPORTED IMMEDIATELY:  *FEVER GREATER THAN 100.5 F  *CHILLS WITH OR WITHOUT FEVER  NAUSEA AND VOMITING THAT IS NOT CONTROLLED WITH YOUR NAUSEA MEDICATION  *UNUSUAL SHORTNESS OF BREATH  *UNUSUAL BRUISING OR BLEEDING  TENDERNESS IN MOUTH AND THROAT WITH OR WITHOUT PRESENCE OF ULCERS  *URINARY PROBLEMS  *BOWEL PROBLEMS  UNUSUAL RASH Items with * indicate a potential emergency and should be followed up as soon as possible.  Feel free to call the clinic should you have any questions or concerns. The clinic phone number is [phone].  Please show the CHEMO ALERT CARD at check-in to the Emergency Department and triage nurse.   

## 2018-03-19 NOTE — Assessment & Plan Note (Signed)
She had recent stable mild chronic renal failure We discussed importance of adequate hydration

## 2018-03-19 NOTE — Assessment & Plan Note (Signed)
She has poorly controlled diabetes I will continue on reduced dose of dexamethasone We discussed importance of dietary modification while on treatment

## 2018-03-21 ENCOUNTER — Ambulatory Visit: Payer: Medicare Other

## 2018-03-23 ENCOUNTER — Telehealth: Payer: Self-pay | Admitting: *Deleted

## 2018-03-23 NOTE — Telephone Encounter (Signed)
CALLED PATIENT TO REMIND OF NEW HDR [facility], SPOKE WITH PATIENT AND SHE IS AWARE OF THESE APPTS 

## 2018-03-26 ENCOUNTER — Ambulatory Visit
Admission: RE | Admit: 2018-03-26 | Discharge: 2018-03-26 | Disposition: A | Discharge status: Home / Self Care | Payer: Medicare Other | Source: Ambulatory Visit | Attending: Radiation Oncology | Admitting: Radiation Oncology

## 2018-03-26 ENCOUNTER — Encounter: Payer: Self-pay | Admitting: Oncology

## 2018-03-26 ENCOUNTER — Encounter: Payer: Self-pay | Admitting: Radiation Oncology

## 2018-03-26 ENCOUNTER — Other Ambulatory Visit: Payer: Self-pay

## 2018-03-26 VITALS — BP 114/76 | HR 112 | Temp 97.9°F | Resp 20 | Ht 66.0 in | Wt 187.2 lb

## 2018-03-26 DIAGNOSIS — C541 Malignant neoplasm of endometrium: Secondary | ICD-10-CM | POA: Insufficient documentation

## 2018-03-26 DIAGNOSIS — Z79899 Other long term (current) drug therapy: Secondary | ICD-10-CM | POA: Diagnosis not present

## 2018-03-26 DIAGNOSIS — Z510 Encounter for antineoplastic radiation therapy: Secondary | ICD-10-CM | POA: Insufficient documentation

## 2018-03-26 DIAGNOSIS — Z794 Long term (current) use of insulin: Secondary | ICD-10-CM | POA: Diagnosis not present

## 2018-03-26 DIAGNOSIS — Z9221 Personal history of antineoplastic chemotherapy: Secondary | ICD-10-CM | POA: Diagnosis not present

## 2018-03-26 DIAGNOSIS — Z4689 Encounter for fitting and adjustment of other specified devices: Secondary | ICD-10-CM | POA: Diagnosis not present

## 2018-03-26 DIAGNOSIS — C773 Secondary and unspecified malignant neoplasm of axilla and upper limb lymph nodes: Secondary | ICD-10-CM | POA: Diagnosis not present

## 2018-03-26 NOTE — Progress Notes (Signed)
Radiation Oncology         [phone] ________________________________  Name: Krista Johnson MRN: [id number]  Date: 03/26/2018  DOB: 17-Aug-1950   Vaginal Brachytherapy Procedure Note  CC: Jonathon Jordan, MD Everitt Amber, MD    ICD-10-CM   1. Endometrial cancer (H[facility]) C54.1     Diagnosis: Pathologic stage: (pT3a, pN0, M1) Endometrial cancer. Figo Stage: IVB  Radiation Treatment Dates: 03/26/18 - 04/23/18  Narrative: She returns today for vaginal cylinder fitting. She has fnished her second round of chemotherapy at this point and has four left. She reports associated aching from this. She denies soreness and any other symptoms.   Since we have last seen her, the pathologic results from her 02/01/18 lymph node, needle/core biopsy, right axilla have been returned, which did show metastatic carcinoma. Based on the serous histology and LVI she's at risk for vaginal cuff recurrence. Patient's case was presented at the multidisciplinary gynecologic oncology conference. 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  ALLERGIES: is allergic to sulfa antibiotics and sulfamethoxazole.  Meds: Current Outpatient Medications  Medication Sig Dispense Refill  . blood glucose meter kit and supplies KIT Dispense based on patient and insurance preference. Use up to four times daily as directed. (FOR ICD-9 250.00, 250.01). 1 each 0  . dexamethasone (DECADRON) 4 MG tablet Take 2 tabs at the night before and 2 tabs the morning of chemotherapy, every 3 weeks, by mouth 24 tablet 0  . Insulin Isophane & Regular Human (NOVOLIN 70/30 FLEXPEN) (70-30) 100 UNIT/ML PEN Inject 30 Units into the skin daily with breakfast. and pen needles 1/day 15 mL 11  . ketoconazole (NIZORAL) 2 % cream Apply to web space twice a day for 1 week then daily for 1 month (Patient taking  differently: Apply 1 application topically daily as needed for irritation. Apply to web space twice a day for 1 week then daily for 1 month) 15 g 0  . lidocaine-prilocaine (EMLA) cream Apply to affected area once 30 g 3  . lisinopril-hydrochlorothiazide (PRINZIDE,ZESTORETIC) 20-12.5 MG tablet Take 1 tablet by mouth daily. (Patient taking differently: Take 1 tablet by mouth every morning. ) 30 tablet 11  . ondansetron (ZOFRAN) 8 MG tablet Take 1 tablet (8 mg total) by mouth every 8 (eight) hours as needed for refractory nausea / vomiting. Start on day 3 after chemo. 30 tablet 1  . ONETOUCH DELICA LANCETS 52W MISC 3 (three) times daily. as directed  5  . ONETOUCH VERIO test strip 3 (three) times daily. as directed  5  . prochlorperazine (COMPAZINE) 10 MG tablet Take 1 tablet (10 mg total) by mouth every 6 (six) hours as needed (Nausea or vomiting). 30 tablet 1  . senna-docusate (SENOKOT-S) 8.6-50 MG tablet Take 1 tablet by mouth at bedtime as needed for mild constipation or moderate constipation. 20 tablet 0  . simvastatin (ZOCOR) 10 MG tablet Take 10 mg by mouth at bedtime.     . Vitamin D, Ergocalciferol, 2000 units CAPS Take 2,000 Units by mouth daily.   0   No current facility-administered medications for this encounter.     Physical Findings: The patient is in no acute distress. Patient is alert and oriented.  height is '5\' 6"'  (1.676 m) and weight is 187 lb 3.2 oz (84.9 kg). Her oral temperature is 97.9 F (36.6 C). Her blood pressure is 114/76 and  her pulse is 112 (abnormal). Her respiration is 20 and oxygen saturation is 100%.   No palpable cervical, supraclavicular or axillary lymphoadenopathy. The heart has a regular rate and rhythm. The lungs are clear to auscultation. Abdomen soft and non-tender.  On pelvic examination the external genitalia were unremarkable. A speculum exam was performed. Vaginal cuff intact, no mucosal lesions. On bimanual exam there were no pelvic masses  appreciated.  Lab Findings: Lab Results  Component Value Date   WBC 5.2 03/16/2018   HGB 10.3 (L) 03/16/2018   HCT 31.8 (L) 03/16/2018   MCV 84.4 03/16/2018   PLT 136 (L) 03/16/2018    Radiographic Findings: No results found.  Impression: Pathologic stage:( pT3a, pN0, M1) Endometrial cancer. Figo Stage: IVB  Patient was fitted for a vaginal cylinder. The patient will be treated with a 2.5 cm diameter cylinder with a treatment length of 4.0 cm. This distended the vaginal vault without undue discomfort. The patient tolerated the procedure well.  The patient was successfully fitted for a vaginal cylinder. The patient is appropriate to begin vaginal brachytherapy.   Plan: The patient will proceed with CT simulation and vaginal brachytherapy today.    _______________________________   Blair Promise, PhD, MD This document serves as a record of services personally performed by Gery Pray, MD. It was created on his behalf by Mary-Margaret Loma Messing, a trained medical scribe. The creation of this record is based on the scribe's personal observations and the provider's statements to them. This document has been checked and approved by the attending provider.

## 2018-03-26 NOTE — Progress Notes (Signed)
Pt presents today for new [place] HDR with Dr. Sondra Come. Pt denies c/o pain today. Pt states she is having difficulty sleeping due to steroid use with chemotherapy. Pt denies fatigue. Pt denies dysuria/hematuria. Pt denies vaginal bleeding/discharge. Pt denies rectal bleeding, diarrhea. Pt does endorse some constipation which is relieved by OTC stool softener.   BP 114/76 (BP Location: Left Arm, Patient Position: Sitting)   Pulse (!) 112   Temp 97.9 F (36.6 C) (Oral)   Resp 20   Ht 5\' 6"  (1.676 m)   Wt 187 lb 3.2 oz (84.9 kg)   SpO2 100%   BMI 30.21 kg/m   Wt Readings from Last 3 Encounters:  03/26/18 187 lb 3.2 oz (84.9 kg)  03/19/18 192 lb 12.8 oz (87.5 kg)  02/26/18 182 lb (82.6 kg)   Loma Sousa, RN BSN

## 2018-03-26 NOTE — Progress Notes (Signed)
  Radiation Oncology         [phone] ________________________________  Name: Krista Johnson MRN: [id number]  Date: 03/26/2018  DOB: [birth year]  SIMULATION AND TREATMENT PLANNING NOTE HDR BRACHYTHERAPY  DIAGNOSIS:  Pathologic stage: pT3a, pN0, M1) Endometrial cancer. Figo Stage: IVB  NARRATIVE:  The patient was brought to the [facility].  Identity was confirmed.  All relevant records and images related to the planned course of therapy were reviewed.  The patient freely provided informed written consent to proceed with treatment after reviewing the details related to the planned course of therapy. The consent form was witnessed and verified by the simulation staff.  Then, the patient was set-up in a stable reproducible  supine position for radiation therapy.  CT images were obtained.  Surface markings were placed.  The CT images were loaded into the planning software.  Then the target and avoidance structures were contoured.  Treatment planning then occurred.  The radiation prescription was entered and confirmed.   I have requested : Brachytherapy Isodose Plan and Dosimetry Calculations to plan the radiation distribution.    PLAN:  The patient will receive 30 Gy in 5 fractions.  Patient will be treated with a 2.5 cm diameter cylinder with a treatment length of 4 cm. Iridium 192 will be the high-dose-rate source.    ________________________________  Blair Promise, PhD, MD  This document serves as a record of services personally performed by Gery Pray, MD. It was created on his behalf by Mary-Margaret Loma Messing, a trained medical scribe. The creation of this record is based on the scribe's personal observations and the provider's statements to them. This document has been checked and approved by the attending provider.

## 2018-03-26 NOTE — Progress Notes (Signed)
  Radiation Oncology         [phone] ________________________________  Name: ROSIA SYME MRN: [id number]  Date: 03/26/2018  DOB: [birth year]  CC: Jonathon Jordan, MD  Everitt Amber, MD  HDR BRACHYTHERAPY NOTE  DIAGNOSIS: Pathologic stage: pT3a, pN0, M1) Endometrial cancer. Figo Stage: IVB   Simple treatment device note: Patient had construction of her custom vaginal cylinder. She will be treated with a 2.5 cm diameter segmented cylinder. This conforms to her anatomy without undue discomfort.  Vaginal brachytherapy procedure node: The patient was brought to the [facility] suite. Identity was confirmed. All relevant records and images related to the planned course of therapy were reviewed. The patient freely provided informed written consent to proceed with treatment after reviewing the details related to the planned course of therapy. The consent form was witnessed and verified by the simulation staff. Then, the patient was set-up in a stable reproducible supine position for radiation therapy. Pelvic exam revealed the vaginal cuff to be intact. The patient's custom vaginal cylinder was placed in the proximal vagina. This was affixed to the CT/MR stabilization plate to prevent slippage. Patient tolerated the placement well.  Verification simulation note:  A fiducial marker was placed within the vaginal cylinder. An AP and lateral film was then obtained through the pelvis area. This documented accurate position of the vaginal cylinder for treatment.  HDR BRACHYTHERAPY TREATMENT  The remote afterloading device was affixed to the vaginal cylinder by catheter. Patient then proceeded to undergo her first high-dose-rate treatment directed at the proximal vagina. The patient was prescribed a dose of 6.0 gray to be delivered to the mucosal surface. Treatment length was 4 cm. Patient was treated with 1 channel using 9 dwell positions. Treatment time was 277.3 seconds. Iridium 192 was the high-dose-rate  source for treatment. The patient tolerated the treatment well. After completion of her therapy, a radiation survey was performed documenting return of the iridium source into the GammaMed safe.   PLAN: She will return next Monday for her second high-dose-rate treatment. ________________________________  Blair Promise, PhD, MD  This document serves as a record of services personally performed by Gery Pray, MD. It was created on his behalf by Mary-Margaret Loma Messing, a trained medical scribe. The creation of this record is based on the scribe's personal observations and the provider's statements to them. This document has been checked and approved by the attending provider.

## 2018-03-28 ENCOUNTER — Ambulatory Visit: Payer: Medicare Other | Admitting: Endocrinology

## 2018-03-28 ENCOUNTER — Telehealth: Payer: Self-pay | Admitting: Endocrinology

## 2018-03-28 ENCOUNTER — Ambulatory Visit (INDEPENDENT_AMBULATORY_CARE_PROVIDER_SITE_OTHER): Payer: Medicare Other | Admitting: Endocrinology

## 2018-03-28 ENCOUNTER — Encounter: Payer: Self-pay | Admitting: Endocrinology

## 2018-03-28 VITALS — BP 140/78 | HR 86 | Ht 66.0 in | Wt 190.0 lb

## 2018-03-28 DIAGNOSIS — E1165 Type 2 diabetes mellitus with hyperglycemia: Secondary | ICD-10-CM

## 2018-03-28 DIAGNOSIS — E1122 Type 2 diabetes mellitus with diabetic chronic kidney disease: Secondary | ICD-10-CM | POA: Diagnosis not present

## 2018-03-28 DIAGNOSIS — C541 Malignant neoplasm of endometrium: Secondary | ICD-10-CM

## 2018-03-28 DIAGNOSIS — Z794 Long term (current) use of insulin: Secondary | ICD-10-CM

## 2018-03-28 DIAGNOSIS — E162 Hypoglycemia, unspecified: Secondary | ICD-10-CM | POA: Diagnosis not present

## 2018-03-28 DIAGNOSIS — N183 Chronic kidney disease, stage 3 (moderate): Secondary | ICD-10-CM

## 2018-03-28 DIAGNOSIS — IMO0001 Reserved for inherently not codable concepts without codable children: Secondary | ICD-10-CM

## 2018-03-28 DIAGNOSIS — E113293 Type 2 diabetes mellitus with mild nonproliferative diabetic retinopathy without macular edema, bilateral: Secondary | ICD-10-CM | POA: Diagnosis not present

## 2018-03-28 DIAGNOSIS — H35033 Hypertensive retinopathy, bilateral: Secondary | ICD-10-CM | POA: Diagnosis not present

## 2018-03-28 LAB — GLUCOSE, POCT (MANUAL RESULT ENTRY): POC Glucose: 41 mg/dl — AB (ref 70–99)

## 2018-03-28 MED ORDER — INSULIN ISOPHANE & REGULAR (HUMAN 70-30)100 UNIT/ML KWIKPEN: 25.0000 [IU] | PEN_INJECTOR | Freq: Every day | SUBCUTANEOUS | 11 refills | Status: DC

## 2018-03-28 NOTE — Telephone Encounter (Signed)
Patient stated that she would like a call back, she is concerned about her blood sugar being low.   Please advise  Thanks!

## 2018-03-28 NOTE — Patient Instructions (Addendum)
Please reduce the insulin to 25 units each morning.  However, take 35 units on chemo days.  check your blood sugar twice a day.  vary the time of day when you check, between before the 3 meals, and at bedtime.  also check if you have symptoms of your blood sugar being too high or too low.  please keep a record of the readings and bring it to your next appointment here (or you can bring the meter itself).  You can write it on any piece of paper.  please call us sooner if your blood sugar goes below 70, or if you have a lot of readings over 200. On this type of insulin schedule, you should eat meals on a regular schedule.  If a meal is missed or significantly delayed, your blood sugar could go low.   Please come back for a follow-up appointment in 1 month.

## 2018-03-28 NOTE — Progress Notes (Signed)
Subjective:    Patient ID: Krista Johnson, female    DOB: 12-Mar-1951, 67 y.o.   MRN: [id number]  HPI Pt returns for f/u of diabetes mellitus: DM type: Insulin-requiring type 2.   Dx'ed: 3235 Complications: renal insuff Therapy: insulin since mid-2019 GDM: never (G0) DKA: never Severe hypoglycemia: never Pancreatitis: never Pancreatic imaging: normal on 2019 CT.   Other: she takes qd insulin, after poor results with multiple daily injections.   Interval history: pt had her first XRT dose 2 days ago.  Today, she had cbg of 47 at lunch.  She had a usual breakfast.  she brings her meter with her cbg's which I have reviewed today.  It varies from 47-172.  There is no trend throughout the day.  However, she says it goes up to the 300's on chemo days.  She takes 30 units qam. Pt also has vit-D def and Hyperparathyroidism (prob a combination of primary and secondary).   Past Medical History:  Diagnosis Date  . Arthritis    right knee--- last cortisone injection 04/ 2019  . CKD (chronic kidney disease)   . Diabetes mellitus without complication [facility])    endocrinologist-  dr Loanne Drilling  . Endometrial cancer ([place])   . History of colon polyps   . Hyperlipidemia   . Hyperparathyroidism ([place])   . Hypertension     Past Surgical History:  Procedure Laterality Date  . COLONOSCOPY  last one 12-25-2017  . IR IMAGING GUIDED PORT INSERTION  02/20/2018  . ROBOTIC ASSISTED TOTAL HYSTERECTOMY WITH BILATERAL SALPINGO OOPHERECTOMY N/A 01/23/2018   Procedure: XI ROBOTIC ASSISTED TOTAL HYSTERECTOMY WITH BILATERAL SALPINGO OOPHORECTOMY;  Surgeon: Everitt Amber, MD;  Location: [facility];  Service: Gynecology;  Laterality: N/A;  . SENTINEL NODE BIOPSY N/A 01/23/2018   Procedure: SENTINEL NODE BIOPSY;  Surgeon: Everitt Amber, MD;  Location: [facility];  Service: Gynecology;  Laterality: N/A;    Social History   Socioeconomic History  . Marital status: Single    Spouse name: Not on file  . Number of children: 0  .  Years of education: Not on file  . Highest education level: Not on file  Occupational History  . Occupation: retired Education officer, [employer]  Social Needs  . Financial resource strain: Not on file  . Food insecurity:    Worry: Not on file    Inability: Not on file  . Transportation needs:    Medical: Not on file    Non-medical: Not on file  Tobacco Use  . Smoking status: Never Smoker  . Smokeless tobacco: Never Used  Substance and Sexual Activity  . Alcohol use: Not Currently    Alcohol/week: 0.0 standard drinks    Frequency: Never  . Drug use: Never  . Sexual activity: Not on file  Lifestyle  . Physical activity:    Days per week: Not on file    Minutes per session: Not on file  . Stress: Not on file  Relationships  . Social connections:    Talks on phone: Not on file    Gets together: Not on file    Attends religious service: Not on file    Active member of club or organization: Not on file    Attends meetings of clubs or organizations: Not on file    Relationship status: Not on file  . Intimate partner violence:    Fear of current or ex partner: Not on file    Emotionally abused: Not on file    Physically abused:  Not on file    Forced sexual activity: Not on file  Other Topics Concern  . Not on file  Social History Narrative  . Not on file    Current Outpatient Medications on File Prior to Visit  Medication Sig Dispense Refill  . blood glucose meter kit and supplies KIT Dispense based on patient and insurance preference. Use up to four times daily as directed. (FOR ICD-9 250.00, 250.01). 1 each 0  . dexamethasone (DECADRON) 4 MG tablet Take 2 tabs at the night before and 2 tabs the morning of chemotherapy, every 3 weeks, by mouth 24 tablet 0  . ketoconazole (NIZORAL) 2 % cream Apply to web space twice a day for 1 week then daily for 1 month (Patient taking differently: Apply 1 application topically daily as needed for irritation. Apply to web space twice a day for 1 week  then daily for 1 month) 15 g 0  . lidocaine-prilocaine (EMLA) cream Apply to affected area once 30 g 3  . lisinopril-hydrochlorothiazide (PRINZIDE,ZESTORETIC) 20-12.5 MG tablet Take 1 tablet by mouth daily. (Patient taking differently: Take 1 tablet by mouth every morning. ) 30 tablet 11  . ondansetron (ZOFRAN) 8 MG tablet Take 1 tablet (8 mg total) by mouth every 8 (eight) hours as needed for refractory nausea / vomiting. Start on day 3 after chemo. 30 tablet 1  . ONETOUCH DELICA LANCETS 36R MISC 3 (three) times daily. as directed  5  . ONETOUCH VERIO test strip 3 (three) times daily. as directed  5  . prochlorperazine (COMPAZINE) 10 MG tablet Take 1 tablet (10 mg total) by mouth every 6 (six) hours as needed (Nausea or vomiting). 30 tablet 1  . senna-docusate (SENOKOT-S) 8.6-50 MG tablet Take 1 tablet by mouth at bedtime as needed for mild constipation or moderate constipation. 20 tablet 0  . simvastatin (ZOCOR) 10 MG tablet Take 10 mg by mouth at bedtime.     . Vitamin D, Ergocalciferol, 2000 units CAPS Take 2,000 Units by mouth daily.   0   No current facility-administered medications on file prior to visit.     Allergies  Allergen Reactions  . Sulfa Antibiotics Nausea Only  . Sulfamethoxazole Nausea Only    Family History  Problem Relation Age of Onset  . Diabetes Mother   . Hypertension Mother   . Diabetes Sister   . Hypertension Sister   . Diabetes Maternal Uncle   . Colon cancer Paternal Aunt   . Diabetes Paternal Aunt   . Stomach cancer Neg Hx   . Rectal cancer Neg Hx   . Esophageal cancer Neg Hx   . Colon polyps Neg Hx     BP 140/78 (BP Location: Left Arm, Patient Position: Sitting, Cuff Size: Normal)   Pulse 86   Ht _0  (1.676 m)   Wt 190 lb (86.2 kg)   SpO2 96%   BMI 30.67 kg/m    Review of Systems She denies LOC    Objective:   Physical Exam VITAL SIGNS:  See vs page.  GENERAL: no distress.  Pulses: foot pulses are intact bilaterally.   MSK: no  deformity of the feet or ankles.  CV: 1+ bilat edema of the legs.   Skin:  no ulcer on the feet or ankles.  normal color and temp on the feet and ankles.   Neuro: sensation is intact to touch on the feet and ankles.    Lab Results  Component Value Date   HGBA1C 7.7 (A)  02/22/2018       Assessment & Plan:  Insulin-requiring type 2 DM, with renal insuff. this is the best control this pt should aim for, given this regimen, which does match insulin to her changing needs throughout the day Hypoglycemia: this limits aggressiveness of glycemic control. Endometrial cancer: she needs to adjust insulin for chemo tx  Patient Instructions  Please reduce the insulin to 25 units each morning.  However, take 35 units on chemo days.  check your blood sugar twice a day.  vary the time of day when you check, between before the 3 meals, and at bedtime.  also check if you have symptoms of your blood sugar being too high or too low.  please keep a record of the readings and bring it to your next appointment here (or you can bring the meter itself).  You can write it on any piece of paper.  please call us sooner if your blood sugar goes below 70, or if you have a lot of readings over 200. On this type of insulin schedule, you should eat meals on a regular schedule.  If a meal is missed or significantly delayed, your blood sugar could go low.   Please come back for a follow-up appointment in 1 month.

## 2018-03-28 NOTE — Telephone Encounter (Signed)
Called pt to inquire further. States "this is the first time I have ever had an experience like this." States she got up this morning, checked CBG and it was 119. Took Novolin 70/30 30 units, ate liver and onions, corn bread and some blackberries. She then proceeded to her eye doctor appt. Nothing happened during her appt that would be out of the norm. When she returned home at 1146, she checked her CBG. States she felt really dizzy. CBG result of 48. Drank apple juice, checked again at 1206 and CBG was 40. Drank OJ and ate 3 pieces of peppermint chocolate candy. Checked again at 1256. CBG result 43. Pt proceeded to eat roast beef, carrots, potatoes, greens and cornbread. Asked pt to check her CBG while on the phone with me (Time was 123pm). Results are 47. Denies any symptoms of hypoglycemia. Further added she completed chemo on Monday of last week and began radiation on Monday of this week. Pt is aware to proceed to ED if she experiences any s/sx of hypoglycemia. Please advise

## 2018-03-30 ENCOUNTER — Telehealth: Payer: Self-pay | Admitting: *Deleted

## 2018-03-30 NOTE — Telephone Encounter (Signed)
Called patient to remind of HDR Tx. for 04-02-18 @ 8 am, lvm for a return call

## 2018-04-02 ENCOUNTER — Ambulatory Visit
Admission: RE | Admit: 2018-04-02 | Discharge: 2018-04-02 | Disposition: A | Discharge status: Home / Self Care | Payer: Medicare Other | Source: Ambulatory Visit | Attending: Radiation Oncology | Admitting: Radiation Oncology

## 2018-04-02 DIAGNOSIS — Z510 Encounter for antineoplastic radiation therapy: Secondary | ICD-10-CM | POA: Diagnosis not present

## 2018-04-02 DIAGNOSIS — C541 Malignant neoplasm of endometrium: Secondary | ICD-10-CM | POA: Diagnosis not present

## 2018-04-02 NOTE — Progress Notes (Addendum)
  Radiation Oncology         [phone] ________________________________  Name: SPARKLE AUBE MRN: [id number]  Date: 04/02/2018  DOB: 09-25-1950  CC: Jonathon Jordan, MD  Everitt Amber, MD  HDR BRACHYTHERAPY NOTE  DIAGNOSIS: Pathologic stage: pT3a, pN0, M1) Endometrial cancer. Figo Stage: IVB   Simple treatment device note: Patient had construction of her custom vaginal cylinder. She will be treated with a 2.5 cm diameter segmented cylinder. This conforms to her anatomy without undue discomfort.  Vaginal brachytherapy procedure node: The patient was brought to the [place] suite. Identity was confirmed. All relevant records and images related to the planned course of therapy were reviewed. The patient freely provided informed written consent to proceed with treatment after reviewing the details related to the planned course of therapy. The consent form was witnessed and verified by the simulation staff. Then, the patient was set-up in a stable reproducible supine position for radiation therapy. Pelvic exam revealed the vaginal cuff to be intact. The patient's custom vaginal cylinder was placed in the proximal vagina. This was affixed to the CT/MR stabilization plate to prevent slippage. Patient tolerated the placement well.  Verification simulation note:  A fiducial marker was placed within the vaginal cylinder. An AP and lateral film was then obtained through the pelvis area. This documented accurate position of the vaginal cylinder for treatment.  HDR BRACHYTHERAPY TREATMENT  The remote afterloading device was affixed to the vaginal cylinder by catheter. Patient then proceeded to undergo her second high-dose-rate treatment directed at the proximal vagina. The patient was prescribed a dose of 6.0 gray to be delivered to the mucosal surface. Treatment length was 4 cm. Patient was treated with 1 channel using 9 dwell positions. Treatment time was 296.2 seconds. Iridium 192 was the high-dose-rate  source for treatment. The patient tolerated the treatment well. After completion of her therapy, a radiation survey was performed documenting return of the iridium source into the Gamma[employer] safe.   PLAN: She will return early January for 3rd treatment. Chemotherapy in the interim. ________________________________  Blair Promise, PhD, MD  This document serves as a record of services personally performed by Gery Pray, MD. It was created on his behalf by Mary-Margaret Loma Messing, a trained medical scribe. The creation of this record is based on the scribe's personal observations and the provider's statements to them. This document has been checked and approved by the attending provider.

## 2018-04-03 NOTE — Addendum Note (Signed)
Encounter addended by: Gery Pray, MD on: 04/03/2018 8:07 AM  Actions taken: Clinical Note Signed

## 2018-04-09 ENCOUNTER — Inpatient Hospital Stay: Payer: Medicare Other

## 2018-04-09 ENCOUNTER — Ambulatory Visit: Payer: Medicare Other | Admitting: Radiation Oncology

## 2018-04-09 DIAGNOSIS — Z90722 Acquired absence of ovaries, bilateral: Secondary | ICD-10-CM | POA: Diagnosis not present

## 2018-04-09 DIAGNOSIS — C541 Malignant neoplasm of endometrium: Secondary | ICD-10-CM

## 2018-04-09 DIAGNOSIS — C773 Secondary and unspecified malignant neoplasm of axilla and upper limb lymph nodes: Secondary | ICD-10-CM | POA: Diagnosis not present

## 2018-04-09 DIAGNOSIS — C786 Secondary malignant neoplasm of retroperitoneum and peritoneum: Secondary | ICD-10-CM | POA: Diagnosis not present

## 2018-04-09 DIAGNOSIS — Z5111 Encounter for antineoplastic chemotherapy: Secondary | ICD-10-CM | POA: Diagnosis not present

## 2018-04-09 DIAGNOSIS — Z90710 Acquired absence of both cervix and uterus: Secondary | ICD-10-CM | POA: Diagnosis not present

## 2018-04-09 LAB — CBC WITH DIFFERENTIAL (CANCER CENTER ONLY)
Abs Immature Granulocytes: 0.03 10*3/uL (ref 0.00–0.07)
Basophils Absolute: 0 10*3/uL (ref 0.0–0.1)
Basophils Relative: 0 %
Eosinophils Absolute: 0 10*3/uL (ref 0.0–0.5)
Eosinophils Relative: 0 %
HCT: 31.2 % — ABNORMAL LOW (ref 36.0–46.0)
Hemoglobin: 10 g/dL — ABNORMAL LOW (ref 12.0–15.0)
Immature Granulocytes: 0 %
Lymphocytes Relative: 18 %
Lymphs Abs: 1.2 10*3/uL (ref 0.7–4.0)
MCH: 27 pg (ref 26.0–34.0)
MCHC: 32.1 g/dL (ref 30.0–36.0)
MCV: 84.3 fL (ref 80.0–100.0)
Monocytes Absolute: 0.4 10*3/uL (ref 0.1–1.0)
Monocytes Relative: 6 %
Neutro Abs: 5.1 10*3/uL (ref 1.7–7.7)
Neutrophils Relative %: 76 %
Platelet Count: 132 10*3/uL — ABNORMAL LOW (ref 150–400)
RBC: 3.7 MIL/uL — ABNORMAL LOW (ref 3.87–5.11)
RDW: 13.6 % (ref 11.5–15.5)
WBC Count: 6.7 10*3/uL (ref 4.0–10.5)
nRBC: 0 % (ref 0.0–0.2)

## 2018-04-09 LAB — CMP (CANCER CENTER ONLY)
ALT: 13 U/L (ref 0–44)
AST: 13 U/L — ABNORMAL LOW (ref 15–41)
Albumin: 3.3 g/dL — ABNORMAL LOW (ref 3.5–5.0)
Alkaline Phosphatase: 90 U/L (ref 38–126)
Anion gap: 9 (ref 5–15)
BUN: 25 mg/dL — ABNORMAL HIGH (ref 8–23)
CO2: 23 mmol/L (ref 22–32)
Calcium: 10 mg/dL (ref 8.9–10.3)
Chloride: 104 mmol/L (ref 98–111)
Creatinine: 1.43 mg/dL — ABNORMAL HIGH (ref 0.44–1.00)
GFR, Est AFR Am: 44 mL/min — ABNORMAL LOW (ref >60)
GFR, Estimated: 38 mL/min — ABNORMAL LOW (ref >60)
Glucose, Bld: 222 mg/dL — ABNORMAL HIGH (ref 70–99)
Potassium: 4.1 mmol/L (ref 3.5–5.1)
Sodium: 136 mmol/L (ref 135–145)
Total Bilirubin: 0.4 mg/dL (ref 0.3–1.2)
Total Protein: 7.2 g/dL (ref 6.5–8.1)

## 2018-04-09 MED ORDER — HEPARIN SOD (PORK) LOCK FLUSH 100 UNIT/ML IV SOLN
250.0000 [IU] | Freq: Once | INTRAVENOUS | Status: AC
  Administered 2018-04-09: 250 [IU]
  Filled 2018-04-09: qty 5

## 2018-04-09 MED ORDER — SODIUM CHLORIDE 0.9% FLUSH
10.0000 mL | Freq: Once | INTRAVENOUS | Status: AC
  Administered 2018-04-09: 10 mL
  Filled 2018-04-09: qty 10

## 2018-04-10 ENCOUNTER — Encounter: Payer: Self-pay | Admitting: Hematology and Oncology

## 2018-04-10 ENCOUNTER — Inpatient Hospital Stay: Payer: Medicare Other

## 2018-04-10 ENCOUNTER — Inpatient Hospital Stay (HOSPITAL_BASED_OUTPATIENT_CLINIC_OR_DEPARTMENT_OTHER): Payer: Medicare Other | Admitting: Hematology and Oncology

## 2018-04-10 ENCOUNTER — Telehealth: Payer: Self-pay | Admitting: Hematology and Oncology

## 2018-04-10 VITALS — HR 89

## 2018-04-10 VITALS — BP 124/67 | HR 109 | Temp 97.9°F | Resp 18 | Ht 66.0 in | Wt 189.4 lb

## 2018-04-10 DIAGNOSIS — Z79899 Other long term (current) drug therapy: Secondary | ICD-10-CM

## 2018-04-10 DIAGNOSIS — N183 Chronic kidney disease, stage 3 unspecified: Secondary | ICD-10-CM

## 2018-04-10 DIAGNOSIS — IMO0001 Reserved for inherently not codable concepts without codable children: Secondary | ICD-10-CM

## 2018-04-10 DIAGNOSIS — T451X5S Adverse effect of antineoplastic and immunosuppressive drugs, sequela: Secondary | ICD-10-CM

## 2018-04-10 DIAGNOSIS — Z90722 Acquired absence of ovaries, bilateral: Secondary | ICD-10-CM

## 2018-04-10 DIAGNOSIS — Z5111 Encounter for antineoplastic chemotherapy: Secondary | ICD-10-CM | POA: Diagnosis not present

## 2018-04-10 DIAGNOSIS — Z90710 Acquired absence of both cervix and uterus: Secondary | ICD-10-CM | POA: Diagnosis not present

## 2018-04-10 DIAGNOSIS — C786 Secondary malignant neoplasm of retroperitoneum and peritoneum: Secondary | ICD-10-CM | POA: Diagnosis not present

## 2018-04-10 DIAGNOSIS — K5909 Other constipation: Secondary | ICD-10-CM | POA: Diagnosis not present

## 2018-04-10 DIAGNOSIS — C541 Malignant neoplasm of endometrium: Secondary | ICD-10-CM

## 2018-04-10 DIAGNOSIS — C773 Secondary and unspecified malignant neoplasm of axilla and upper limb lymph nodes: Secondary | ICD-10-CM

## 2018-04-10 DIAGNOSIS — D61818 Other pancytopenia: Secondary | ICD-10-CM | POA: Diagnosis not present

## 2018-04-10 DIAGNOSIS — Z794 Long term (current) use of insulin: Secondary | ICD-10-CM | POA: Diagnosis not present

## 2018-04-10 DIAGNOSIS — E1122 Type 2 diabetes mellitus with diabetic chronic kidney disease: Secondary | ICD-10-CM

## 2018-04-10 DIAGNOSIS — E1165 Type 2 diabetes mellitus with hyperglycemia: Secondary | ICD-10-CM

## 2018-04-10 MED ORDER — SODIUM CHLORIDE 0.9% FLUSH
10.0000 mL | INTRAVENOUS | Status: DC | PRN
  Administered 2018-04-10: 10 mL
  Filled 2018-04-10: qty 10

## 2018-04-10 MED ORDER — FAMOTIDINE IN NACL 20-0.9 MG/50ML-% IV SOLN
INTRAVENOUS | Status: AC
  Filled 2018-04-10: qty 50

## 2018-04-10 MED ORDER — DIPHENHYDRAMINE HCL 50 MG/ML IJ SOLN
INTRAMUSCULAR | Status: AC
  Filled 2018-04-10: qty 1

## 2018-04-10 MED ORDER — SODIUM CHLORIDE 0.9 % IV SOLN
Freq: Once | INTRAVENOUS | Status: AC
  Administered 2018-04-10: 09:00:00 via INTRAVENOUS
  Filled 2018-04-10: qty 250

## 2018-04-10 MED ORDER — HEPARIN SOD (PORK) LOCK FLUSH 100 UNIT/ML IV SOLN
500.0000 [IU] | Freq: Once | INTRAVENOUS | Status: AC | PRN
  Administered 2018-04-10: 500 [IU]
  Filled 2018-04-10: qty 5

## 2018-04-10 MED ORDER — FAMOTIDINE IN NACL 20-0.9 MG/50ML-% IV SOLN
20.0000 mg | Freq: Once | INTRAVENOUS | Status: AC
  Administered 2018-04-10: 20 mg via INTRAVENOUS

## 2018-04-10 MED ORDER — PALONOSETRON HCL INJECTION 0.25 MG/5ML
INTRAVENOUS | Status: AC
  Filled 2018-04-10: qty 5

## 2018-04-10 MED ORDER — DIPHENHYDRAMINE HCL 50 MG/ML IJ SOLN
50.0000 mg | Freq: Once | INTRAMUSCULAR | Status: AC
  Administered 2018-04-10: 50 mg via INTRAVENOUS

## 2018-04-10 MED ORDER — SODIUM CHLORIDE 0.9 % IV SOLN
470.0000 mg | Freq: Once | INTRAVENOUS | Status: AC
  Administered 2018-04-10: 470 mg via INTRAVENOUS
  Filled 2018-04-10: qty 47

## 2018-04-10 MED ORDER — SODIUM CHLORIDE 0.9 % IV SOLN
Freq: Once | INTRAVENOUS | Status: AC
  Administered 2018-04-10: 10:00:00 via INTRAVENOUS
  Filled 2018-04-10: qty 5

## 2018-04-10 MED ORDER — PALONOSETRON HCL INJECTION 0.25 MG/5ML
0.2500 mg | Freq: Once | INTRAVENOUS | Status: AC
  Administered 2018-04-10: 0.25 mg via INTRAVENOUS

## 2018-04-10 MED ORDER — SODIUM CHLORIDE 0.9 % IV SOLN
140.0000 mg/m2 | Freq: Once | INTRAVENOUS | Status: AC
  Administered 2018-04-10: 282 mg via INTRAVENOUS
  Filled 2018-04-10: qty 47

## 2018-04-10 NOTE — Assessment & Plan Note (Signed)
She had recent stable mild chronic renal failure We discussed importance of adequate hydration

## 2018-04-10 NOTE — Telephone Encounter (Signed)
Per 12/24 no new orders

## 2018-04-10 NOTE — Assessment & Plan Note (Signed)
She has stable mild pancytopenia.  We will continue treatment without dose adjustment

## 2018-04-10 NOTE — Assessment & Plan Note (Signed)
She has stable diabetes while on treatment and takes additional doses of insulin as needed for the first few days after chemo I will continue on reduced dose of dexamethasone We discussed importance of dietary modification while on treatment

## 2018-04-10 NOTE — Patient Instructions (Signed)
[facility] Discharge Instructions for Patients Receiving Chemotherapy  Today you received the following chemotherapy agents Paclitaxel (TAXOL) & Carboplatin (PARAPLATIN).  To help prevent nausea and vomiting after your treatment, we encourage you to take your nausea medication as prescribed.  If you develop nausea and vomiting that is not controlled by your nausea medication, call the clinic.   BELOW ARE SYMPTOMS THAT SHOULD BE REPORTED IMMEDIATELY:  *FEVER GREATER THAN 100.5 F  *CHILLS WITH OR WITHOUT FEVER  NAUSEA AND VOMITING THAT IS NOT CONTROLLED WITH YOUR NAUSEA MEDICATION  *UNUSUAL SHORTNESS OF BREATH  *UNUSUAL BRUISING OR BLEEDING  TENDERNESS IN MOUTH AND THROAT WITH OR WITHOUT PRESENCE OF ULCERS  *URINARY PROBLEMS  *BOWEL PROBLEMS  UNUSUAL RASH Items with * indicate a potential emergency and should be followed up as soon as possible.  Feel free to call the clinic should you have any questions or concerns. The clinic phone number is [phone].  Please show the CHEMO ALERT CARD at check-in to the Emergency Department and triage nurse.   

## 2018-04-10 NOTE — Assessment & Plan Note (Signed)
Overall, she tolerated treatment well without major side effects Her previously palpable lymphadenopathy in the axilla has resolved As previously discussed, I plan to repeat imaging study after cycle 3 of treatment for objective assessment of response to therapy.  She agreed to proceed

## 2018-04-10 NOTE — Progress Notes (Signed)
[place] OFFICE PROGRESS NOTE  Patient Care Team: Jonathon Jordan, MD as PCP - General (Family Medicine)  ASSESSMENT & PLAN:  Endometrial cancer ([place]) Overall, she tolerated treatment well without major side effects Her previously palpable lymphadenopathy in the axilla has resolved As previously discussed, I plan to repeat imaging study after cycle 3 of treatment for objective assessment of response to therapy.  She agreed to proceed  Chronic kidney disease (CKD), stage III (moderate) ([place]) She had recent stable mild chronic renal failure We discussed importance of adequate hydration  Pancytopenia, acquired ([place]) She has stable mild pancytopenia.  We will continue treatment without dose adjustment  Diabetes mellitus, insulin dependent (IDDM), uncontrolled ([place]) She has stable diabetes while on treatment and takes additional doses of insulin as needed for the first few days after chemo I will continue on reduced dose of dexamethasone We discussed importance of dietary modification while on treatment   Orders Placed This Encounter  Procedures  . NM PET Image Restag (PS) Skull Base To Thigh    Standing Status:   Future    Standing Expiration Date:   04/11/2019    Order Specific Question:   If indicated for the ordered procedure, I authorize the administration of a radiopharmaceutical per Radiology protocol    Answer:   Yes    Order Specific Question:   Preferred imaging location?    Answer:   [facility]    Order Specific Question:   Radiology Contrast Protocol - do NOT remove file path    Answer:   [email or web address]    INTERVAL HISTORY: Please see below for problem oriented charting. 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 Leo Grosser who obtained an endometrial biopsy showing poorly differentiated endometrial carcinoma and negative endocervical curettage    12/12/2017 Imaging    MAMMOGRAM FINDINGS: In the right axilla, a possible mass warrants further evaluation. In the left breast, no findings suspicious for malignancy.  Images were processed with CAD.  IMPRESSION: Further evaluation is suggested for possible mass in the right axilla.     12/24/2017 Imaging    Ct scan chest, abdomen and pelvis 1. Marked thickening of the endometrium (42 mm) compatible with known primary endometrial malignancy. No evidence of extrauterine invasion. 2. No pelvic or retroperitoneal adenopathy. 3. Right middle lobe 4 mm solid pulmonary nodule, for which follow-up chest CT is advised in 3-6 months. 4. Several findings that are equivocal for distant metastatic disease. Vaguely nodular heterogeneous hyperenhancement in the peripheral right liver lobe, which could represent benign transient perfusional phenomena, with underlying liver lesions not entirely excluded. Mildly sclerotic T12 vertebral lesion. Mildly enlarged right axillary lymph node. The best single test to further evaluate these findings would be a PET-CT. Alternative tests include bone scan or thoracic MRI without and with IV contrast for the T12 osseous lesion, MRI abdomen without and with IV contrast for the liver findings, and diagnostic mammographic evaluation for the right  axillary node. 5.  Aortic Atherosclerosis (ICD10-I70.0).     01/09/2018 PET scan    1. Moderate hypermetabolism  corresponding to enlarging axillary node since 12/22/2017. Highly suspicious for an atypical distribution of metastatic disease. 2. No hypermetabolism to suggest hepatic or T12 osseous metastasis. 3. Hypermetabolic endometrial primary.    01/23/2018 Initial Diagnosis    Endometrial cancer ([place])    01/23/2018 Pathology Results    1. Lymph node, sentinel, biopsy, left external iliac - NO CARCINOMA IDENTIFIED IN ONE LYMPH NODE (0/1) - SEE COMMENT 2. Lymph nodes, regional resection, right para aortic - NO CARCINOMA IDENTIFIED IN FOUR LYMPH NODES (0/4) - SEE COMMENT 3. Lymph nodes, regional resection, right pelvic - NO CARCINOMA IDENTIFIED IN EIGHT LYMPH NODES (0/8) - SEE COMMENT 4. Cul-de-sac biopsy - METASTATIC CARCINOMA 5. Uterus +/- tubes/ovaries, neoplastic, cervix, bilateral fallopian tubes and ovaries UTERUS: - MIXED SEROUS AND ENDOMETRIOID CARCINOMA - SEROSAL IMPLANTS PRESENT - LYMPHOVASCULAR SPACE INVASION PRESENT - LEIOMYOMATA (1.5 CM; LARGEST) - SEE ONCOLOGY TABLE AND COMMENT BELOW CERVIX: - BENIGN NABOTHIAN CYSTS - NO CARCINOMA IDENTIFIED BILATERAL OVARIES: - METASTATIC CARCINOMA PRESENT ON OVARIAN SURFACE BILATERAL FALLOPIAN TUBES: - INTRALUMINAL CARCINOMA Microscopic Comment 1. -3. Cytokeratin AE1/3 was performed on the sentinel lymph nodes to exclude micrometastasis. There is no evidence of metastatic carcinoma by immunohistochemistry. 5. 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%) Histologic Grade: N/A Myometrial invasion: Estimated less than 50% myometrial invasion (0.3 cm of myometrium involved; 1.4 cm measured thickness) Uterine Serosa Involvement: Present Cervical stromal involvement: Not identified Extent of involvement of other  organs: - Fallopian tube (left within the lumen) - Ovary, left (surface involvement) - Cul-de-sac Lymphovascular invasion: Present Regional Lymph Nodes: Examined: 1 Sentinel 12 Non-sentinel 13 Total Lymph nodes with metastasis: 0 Isolated tumor cells (< 0.2 mm): 0 Micrometastasis: (> 0.2 mm and < 2.0 mm): 0 Macrometastasis: (> 2.0 mm): 0 Extracapsular extension: N/A Tumor block for ancillary studies: 5E, 5B MMR / MSI testing: Pending will be reported separately Pathologic Stage Classification (pTNM, AJCC 8th edition): pT3a, pN0 FIGO Stage: IIIA COMMENT: There is tumor present on the surface of the left ovary and within the lumen of the left fallopian tube. The carcinoma appears to be mixed with the largest component being high grade serous carcinoma. Dr. Lyndon Code reviewed the case and agrees with the above diagnosis.    01/23/2018 Surgery    Surgeon: Donaciano Eva   Operation: Robotic-assisted laparoscopic total hysterectomy with bilateral salpingoophorectomy, SLN injection, mapping and biopsy, right pelvic and para-aortic lymphadenectomy  Operative Findings:  : 10-12cm bulky uterus with frank serosal involvement on posterior cul de sac peritoneum and anterior peritoneum which adhesed the bladder to the anterior uterus. Unilateral mapping on left pelvis. No grossly suspicious nodes. Normal omentum and diaphragms.     02/01/2018 Pathology Results    Lymph node, needle/core biopsy, right axilla - METASTATIC CARCINOMA - SEE COMMENT Microscopic Comment The neoplastic cells are positive for cytokeratin 7 and Pax-8 but negative for cytokeratin 5/6, cytokeratin 20, Gata-3, p63, p53 and GCDFP. Overall, the immunoprofile is consistent with metastasis from the patient's known gynecologic carcinoma.    02/01/2018 Procedure    Ultrasound-guided core biopsies of a suspicious right axillary lymph node.    02/21/2018 -  Chemotherapy    The patient had carboplatin & Taxol     REVIEW OF  SYSTEMS:   Constitutional: Denies fevers, chills or abnormal weight loss Eyes: Denies blurriness of vision Ears, nose, mouth, throat, and face: Denies mucositis or sore throat Respiratory: Denies cough, dyspnea or wheezes Cardiovascular: Denies palpitation, chest discomfort or lower extremity swelling Gastrointestinal:  Denies nausea, heartburn or change in bowel habits Skin: Denies abnormal skin rashes Lymphatics: Denies new lymphadenopathy or easy bruising Neurological:Denies numbness, tingling or new weaknesses Behavioral/Psych: Mood is stable, no new changes  All other systems were reviewed with the patient and are negative.  I have reviewed the past medical history, past surgical history, social history and family history with the patient and they are unchanged from previous note.  ALLERGIES:  is allergic to sulfa antibiotics and sulfamethoxazole.  MEDICATIONS:  Current Outpatient Medications  Medication Sig Dispense Refill  . blood glucose meter kit and supplies KIT Dispense based on patient and insurance preference. Use up to four times daily as directed. (FOR ICD-9 250.00, 250.01). 1 each 0  . dexamethasone (DECADRON) 4 MG tablet Take 2 tabs at the night before and 2 tabs the morning of chemotherapy, every 3 weeks, by mouth 24 tablet 0  . Insulin Isophane & Regular Human (NOVOLIN 70/30 FLEXPEN) (70-30) 100 UNIT/ML PEN Inject 25 Units into the skin daily with breakfast. Take 35 units on chemo days.  also pen needles 1/day 15 mL 11  . ketoconazole (NIZORAL) 2 % cream Apply to web space twice a day for 1 week then daily for 1 month (Patient taking differently: Apply 1 application topically daily as needed for irritation. Apply to web space twice a day for 1 week then daily for 1 month) 15 g 0  . lidocaine-prilocaine (EMLA) cream Apply to affected area once 30 g 3  . lisinopril-hydrochlorothiazide (PRINZIDE,ZESTORETIC) 20-12.5 MG tablet Take 1 tablet by mouth daily. (Patient taking  differently: Take 1 tablet by mouth every morning. ) 30 tablet 11  . ondansetron (ZOFRAN) 8 MG tablet Take 1 tablet (8 mg total) by mouth every 8 (eight) hours as needed for refractory nausea / vomiting. Start on day 3 after chemo. 30 tablet 1  . ONETOUCH DELICA LANCETS 58X MISC 3 (three) times daily. as directed  5  . ONETOUCH VERIO test strip 3 (three) times daily. as directed  5  . prochlorperazine (COMPAZINE) 10 MG tablet Take 1 tablet (10 mg total) by mouth every 6 (six) hours as needed (Nausea or vomiting). 30 tablet 1  . senna-docusate (SENOKOT-S) 8.6-50 MG tablet Take 1 tablet by mouth at bedtime as needed for mild constipation or moderate constipation. 20 tablet 0  . simvastatin (ZOCOR) 10 MG tablet Take 10 mg by mouth at bedtime.     . Vitamin D, Ergocalciferol, 2000 units CAPS Take 2,000 Units by mouth daily.   0   No current facility-administered medications for this visit.     PHYSICAL EXAMINATION: ECOG PERFORMANCE STATUS: 1 - Symptomatic but completely ambulatory  Vitals:   04/10/18 0900  BP: 124/67  Pulse: (!) 109  Resp: 18  Temp: 97.9 F (36.6 C)  SpO2: 100%   Filed Weights   04/10/18 0900  Weight: 189 lb 6.4 oz (85.9 kg)    GENERAL:alert, no distress and comfortable SKIN: skin color, texture, turgor are normal, no rashes or significant lesions EYES: normal, Conjunctiva are pink and non-injected, sclera clear OROPHARYNX:no exudate, no erythema and lips, buccal mucosa, and tongue normal  NECK: supple, thyroid normal size, non-tender, without nodularity LYMPH:  no palpable lymphadenopathy in the cervical, axillary or  inguinal LUNGS: clear to auscultation and percussion with normal breathing effort HEART: regular rate & rhythm and no murmurs and no lower extremity edema ABDOMEN:abdomen soft, non-tender and normal bowel sounds Musculoskeletal:no cyanosis of digits and no clubbing  NEURO: alert & oriented x 3 with fluent speech, no focal motor/sensory  deficits  LABORATORY DATA:  I have reviewed the data as listed    Component Value Date/Time   NA 136 04/09/2018 1008   K 4.1 04/09/2018 1008   CL 104 04/09/2018 1008   CO2 23 04/09/2018 1008   GLUCOSE 222 (H) 04/09/2018 1008   BUN 25 (H) 04/09/2018 1008   CREATININE 1.43 (H) 04/09/2018 1008   CALCIUM 10.0 04/09/2018 1008   PROT 7.2 04/09/2018 1008   ALBUMIN 3.3 (L) 04/09/2018 1008   AST 13 (L) 04/09/2018 1008   ALT 13 04/09/2018 1008   ALKPHOS 90 04/09/2018 1008   BILITOT 0.4 04/09/2018 1008   GFRNONAA 38 (L) 04/09/2018 1008   GFRAA 44 (L) 04/09/2018 1008    No results found for: SPEP, UPEP  Lab Results  Component Value Date   WBC 6.7 04/09/2018   NEUTROABS 5.1 04/09/2018   HGB 10.0 (L) 04/09/2018   HCT 31.2 (L) 04/09/2018   MCV 84.3 04/09/2018   PLT 132 (L) 04/09/2018      Chemistry      Component Value Date/Time   NA 136 04/09/2018 1008   K 4.1 04/09/2018 1008   CL 104 04/09/2018 1008   CO2 23 04/09/2018 1008   BUN 25 (H) 04/09/2018 1008   CREATININE 1.43 (H) 04/09/2018 1008      Component Value Date/Time   CALCIUM 10.0 04/09/2018 1008   ALKPHOS 90 04/09/2018 1008   AST 13 (L) 04/09/2018 1008   ALT 13 04/09/2018 1008   BILITOT 0.4 04/09/2018 1008     All questions were answered. The patient knows to call the clinic with any problems, questions or concerns. No barriers to learning was detected.  I spent 15 minutes counseling the patient face to face. The total time spent in the appointment was 20 minutes and more than 50% was on counseling and review of test results  [name], MD 04/10/2018 9:10 AM

## 2018-04-12 LAB — GLUCOSE, CAPILLARY: Glucose-Capillary: 236 mg/dL — ABNORMAL HIGH (ref 70–99)

## 2018-04-16 ENCOUNTER — Ambulatory Visit: Payer: Medicare Other | Admitting: Radiation Oncology

## 2018-04-17 ENCOUNTER — Telehealth: Payer: Self-pay | Admitting: *Deleted

## 2018-04-17 NOTE — Telephone Encounter (Signed)
Called patient to remind of HDR Tx. for 04-19-18 @ 9 am, lvm for a return call

## 2018-04-19 ENCOUNTER — Ambulatory Visit
Admission: RE | Admit: 2018-04-19 | Discharge: 2018-04-19 | Disposition: A | Discharge status: Home / Self Care | Payer: Medicare Other | Source: Ambulatory Visit | Attending: Radiation Oncology | Admitting: Radiation Oncology

## 2018-04-19 DIAGNOSIS — C541 Malignant neoplasm of endometrium: Secondary | ICD-10-CM | POA: Diagnosis not present

## 2018-04-19 DIAGNOSIS — Z510 Encounter for antineoplastic radiation therapy: Secondary | ICD-10-CM | POA: Diagnosis not present

## 2018-04-19 NOTE — Progress Notes (Signed)
  Radiation Oncology         [phone] ________________________________  Name: Krista Johnson MRN: [id number]  Date: 04/19/2018  DOB: Aug 24, 1950  CC: Jonathon Jordan, MD  Everitt Amber, MD  HDR BRACHYTHERAPY NOTE  DIAGNOSIS: 68 y.o. female with Pathologic stage: pT3a, pN0, M1) Endometrial cancer. Figo Stage: IVB   Simple treatment device note: Patient had construction of her custom vaginal cylinder. She will be treated with a 2.5 cm diameter segmented cylinder. This conforms to her anatomy without undue discomfort.  Vaginal brachytherapy procedure node: The patient was brought to the [place] suite. Identity was confirmed. All relevant records and images related to the planned course of therapy were reviewed. The patient freely provided informed written consent to proceed with treatment after reviewing the details related to the planned course of therapy. The consent form was witnessed and verified by the simulation staff. Then, the patient was set-up in a stable reproducible supine position for radiation therapy. Pelvic exam revealed the vaginal cuff to be intact. The patient's custom vaginal cylinder was placed in the proximal vagina. This was affixed to the CT/MR stabilization plate to prevent slippage. Patient tolerated the placement well.  Verification simulation note:  A fiducial marker was placed within the vaginal cylinder. An AP and lateral film was then obtained through the pelvis area. This documented accurate position of the vaginal cylinder for treatment.  HDR BRACHYTHERAPY TREATMENT  The remote afterloading device was affixed to the vaginal cylinder by catheter. Patient then proceeded to undergo her third high-dose-rate treatment directed at the proximal vagina. The patient was prescribed a dose of 6 gray to be delivered to the mucosal surface. Treatment length was 4 cm. Patient was treated with 1 channel using 9 dwell positions. Treatment time was 347.3 seconds. Iridium 192 was the  high-dose-rate source for treatment. The patient tolerated the treatment well. After completion of her therapy, a radiation survey was performed documenting return of the iridium source into the GammaMed safe.   PLAN: The patient will return on 04/23/18 for her fourth HDR treatment.  ________________________________  Blair Promise, PhD, MD   This document serves as a record of services personally performed by Gery Pray, MD. It was created on his behalf by Rae Lips, a trained medical scribe. The creation of this record is based on the scribe's personal observations and the provider's statements to them. This document has been checked and approved by the attending provider.

## 2018-04-20 ENCOUNTER — Telehealth: Payer: Self-pay | Admitting: *Deleted

## 2018-04-20 NOTE — Telephone Encounter (Signed)
CALLED PATIENT TO REMIND OF HDR TX. FOR 04-23-18 @ 9 AM, SPOKE WITH PATIENT AND SHE IS AWARE OF THIS Krista Johnson.

## 2018-04-23 ENCOUNTER — Ambulatory Visit
Admission: RE | Admit: 2018-04-23 | Discharge: 2018-04-23 | Disposition: A | Discharge status: Home / Self Care | Payer: Medicare Other | Source: Ambulatory Visit | Attending: Radiation Oncology | Admitting: Radiation Oncology

## 2018-04-23 ENCOUNTER — Ambulatory Visit: Payer: Medicare Other | Admitting: Radiation Oncology

## 2018-04-23 DIAGNOSIS — C541 Malignant neoplasm of endometrium: Secondary | ICD-10-CM

## 2018-04-23 DIAGNOSIS — Z510 Encounter for antineoplastic radiation therapy: Secondary | ICD-10-CM | POA: Diagnosis not present

## 2018-04-23 NOTE — Progress Notes (Signed)
  Radiation Oncology         [phone] ________________________________  Name: Krista Johnson MRN: [id number]  Date: 04/23/2018  DOB: 05/16/1950  CC: Jonathon Jordan, MD  Everitt Amber, MD  HDR BRACHYTHERAPY NOTE  DIAGNOSIS: 68 y.o. female with Pathologic stage: pT3a, pN0, M1) Endometrial cancer. Figo Stage: IVB   Simple treatment device note: Patient had construction of her custom vaginal cylinder. She will be treated with a 2.5 cm diameter segmented cylinder. This conforms to her anatomy without undue discomfort.  Vaginal brachytherapy procedure node: The patient was brought to the [place] suite. Identity was confirmed. All relevant records and images related to the planned course of therapy were reviewed. The patient freely provided informed written consent to proceed with treatment after reviewing the details related to the planned course of therapy. The consent form was witnessed and verified by the simulation staff. Then, the patient was set-up in a stable reproducible supine position for radiation therapy. Pelvic exam revealed the vaginal cuff to be intact. The patient's custom vaginal cylinder was placed in the proximal vagina. This was affixed to the CT/MR stabilization plate to prevent slippage. Patient tolerated the placement well.  Verification simulation note:  A fiducial marker was placed within the vaginal cylinder. An AP and lateral film was then obtained through the pelvis area. This documented accurate position of the vaginal cylinder for treatment.  HDR BRACHYTHERAPY TREATMENT  The remote afterloading device was affixed to the vaginal cylinder by catheter. Patient then proceeded to undergo her fourth high-dose-rate treatment directed at the proximal vagina. The patient was prescribed a dose of 6 gray to be delivered to the mucosal surface. Treatment length was 4 cm. Patient was treated with 1 channel using 9 dwell positions. Treatment time was 159.0 seconds. Iridium 192 was the  high-dose-rate source for treatment. The patient tolerated the treatment well. After completion of her therapy, a radiation survey was performed documenting return of the iridium source into the GammaMed safe.   PLAN: The patient will return on 04/30/18 for her fifth HDR treatment.  ________________________________  Blair Promise, PhD, MD   This document serves as a record of services personally performed by Gery Pray, MD. It was created on his behalf by Mary-Margaret Loma Messing, a trained medical scribe. The creation of this record is based on the scribe's personal observations and the provider's statements to them. This document has been checked and approved by the attending provider.

## 2018-04-25 ENCOUNTER — Ambulatory Visit (INDEPENDENT_AMBULATORY_CARE_PROVIDER_SITE_OTHER): Payer: Medicare Other | Admitting: Podiatry

## 2018-04-25 ENCOUNTER — Encounter: Payer: Self-pay | Admitting: Podiatry

## 2018-04-25 DIAGNOSIS — M79675 Pain in left toe(s): Secondary | ICD-10-CM

## 2018-04-25 DIAGNOSIS — E1159 Type 2 diabetes mellitus with other circulatory complications: Secondary | ICD-10-CM

## 2018-04-25 DIAGNOSIS — B351 Tinea unguium: Secondary | ICD-10-CM

## 2018-04-25 DIAGNOSIS — M79674 Pain in right toe(s): Secondary | ICD-10-CM

## 2018-04-25 NOTE — Progress Notes (Signed)
Complaint:  Visit Type: Patient returns to my office for continued preventative foot care services. Complaint: Patient states" my nails have grown long and thick and become painful to walk and wear shoes" Patient has been diagnosed with DM with no foot complications. The patient presents for preventative foot care services. No changes to ROS  Podiatric Exam: Vascular: dorsalis pedis and posterior tibial pulses are palpable bilateral. Capillary return is immediate. Temperature gradient is WNL. Skin turgor WNL  Sensorium: Normal Semmes Weinstein monofilament test. Normal tactile sensation bilaterally. Nail Exam: Pt has thick disfigured discolored nails with subungual debris noted bilateral entire nail hallux through fifth toenails Ulcer Exam: There is no evidence of ulcer or pre-ulcerative changes or infection. Orthopedic Exam: Muscle tone and strength are WNL. No limitations in general ROM. No crepitus or effusions noted. Foot type and digits show no abnormalities. Bony prominences are unremarkable. Skin: No Porokeratosis. No infection or ulcers  Diagnosis:  Onychomycosis, , Pain in right toe, pain in left toes  Treatment & Plan Procedures and Treatment: Consent by patient was obtained for treatment procedures.   Debridement of mycotic and hypertrophic toenails, 1 through 5 bilateral and clearing of subungual debris. No ulceration, no infection noted.  Return Visit-Office Procedure: Patient instructed to return to the office for a follow up visit 3 months for continued evaluation and treatment.    Uriyah Raska DPM 

## 2018-04-26 ENCOUNTER — Ambulatory Visit (HOSPITAL_COMMUNITY)
Admission: RE | Admit: 2018-04-26 | Discharge: 2018-04-26 | Disposition: A | Discharge status: Home / Self Care | Payer: Medicare Other | Source: Ambulatory Visit | Attending: Hematology and Oncology | Admitting: Hematology and Oncology

## 2018-04-26 DIAGNOSIS — C773 Secondary and unspecified malignant neoplasm of axilla and upper limb lymph nodes: Secondary | ICD-10-CM | POA: Diagnosis not present

## 2018-04-26 DIAGNOSIS — C541 Malignant neoplasm of endometrium: Secondary | ICD-10-CM | POA: Diagnosis not present

## 2018-04-26 LAB — GLUCOSE, CAPILLARY: Glucose-Capillary: 173 mg/dL — ABNORMAL HIGH (ref 70–99)

## 2018-04-26 MED ORDER — FLUDEOXYGLUCOSE F - 18 (FDG) INJECTION
10.3000 | Freq: Once | INTRAVENOUS | Status: AC | PRN
  Administered 2018-04-26: 10.3 via INTRAVENOUS

## 2018-04-27 ENCOUNTER — Telehealth: Payer: Self-pay | Admitting: *Deleted

## 2018-04-27 NOTE — Telephone Encounter (Signed)
CALLED PATIENT TO REMIND OF HDR TX. FOR 04-30-18 @ 9 AM, SPOKE WITH PATIENT AND SHE IS AWARE OF THIS [place].

## 2018-04-30 ENCOUNTER — Ambulatory Visit
Admission: RE | Admit: 2018-04-30 | Discharge: 2018-04-30 | Disposition: A | Discharge status: Home / Self Care | Payer: Medicare Other | Source: Ambulatory Visit | Attending: Radiation Oncology | Admitting: Radiation Oncology

## 2018-04-30 ENCOUNTER — Inpatient Hospital Stay: Payer: Medicare Other

## 2018-04-30 ENCOUNTER — Inpatient Hospital Stay: Payer: Medicare Other | Attending: Gynecology

## 2018-04-30 DIAGNOSIS — K769 Liver disease, unspecified: Secondary | ICD-10-CM | POA: Insufficient documentation

## 2018-04-30 DIAGNOSIS — Z794 Long term (current) use of insulin: Secondary | ICD-10-CM | POA: Insufficient documentation

## 2018-04-30 DIAGNOSIS — E1165 Type 2 diabetes mellitus with hyperglycemia: Secondary | ICD-10-CM | POA: Insufficient documentation

## 2018-04-30 DIAGNOSIS — C541 Malignant neoplasm of endometrium: Secondary | ICD-10-CM

## 2018-04-30 DIAGNOSIS — Z79899 Other long term (current) drug therapy: Secondary | ICD-10-CM | POA: Diagnosis not present

## 2018-04-30 DIAGNOSIS — Z5111 Encounter for antineoplastic chemotherapy: Secondary | ICD-10-CM | POA: Diagnosis not present

## 2018-04-30 DIAGNOSIS — C773 Secondary and unspecified malignant neoplasm of axilla and upper limb lymph nodes: Secondary | ICD-10-CM | POA: Diagnosis not present

## 2018-04-30 DIAGNOSIS — Z510 Encounter for antineoplastic radiation therapy: Secondary | ICD-10-CM | POA: Diagnosis not present

## 2018-04-30 DIAGNOSIS — N183 Chronic kidney disease, stage 3 (moderate): Secondary | ICD-10-CM | POA: Insufficient documentation

## 2018-04-30 DIAGNOSIS — C786 Secondary malignant neoplasm of retroperitoneum and peritoneum: Secondary | ICD-10-CM | POA: Insufficient documentation

## 2018-04-30 DIAGNOSIS — E1122 Type 2 diabetes mellitus with diabetic chronic kidney disease: Secondary | ICD-10-CM | POA: Diagnosis not present

## 2018-04-30 DIAGNOSIS — D638 Anemia in other chronic diseases classified elsewhere: Secondary | ICD-10-CM | POA: Insufficient documentation

## 2018-04-30 DIAGNOSIS — R590 Localized enlarged lymph nodes: Secondary | ICD-10-CM | POA: Insufficient documentation

## 2018-04-30 LAB — CMP (CANCER CENTER ONLY)
ALT: 11 U/L (ref 0–44)
AST: 14 U/L — ABNORMAL LOW (ref 15–41)
Albumin: 3.3 g/dL — ABNORMAL LOW (ref 3.5–5.0)
Alkaline Phosphatase: 87 U/L (ref 38–126)
Anion gap: 9 (ref 5–15)
BUN: 35 mg/dL — ABNORMAL HIGH (ref 8–23)
CO2: 24 mmol/L (ref 22–32)
Calcium: 10.2 mg/dL (ref 8.9–10.3)
Chloride: 107 mmol/L (ref 98–111)
Creatinine: 1.5 mg/dL — ABNORMAL HIGH (ref 0.44–1.00)
GFR, Est AFR Am: 41 mL/min — ABNORMAL LOW (ref >60)
GFR, Estimated: 36 mL/min — ABNORMAL LOW (ref >60)
Glucose, Bld: 158 mg/dL — ABNORMAL HIGH (ref 70–99)
Potassium: 4.1 mmol/L (ref 3.5–5.1)
Sodium: 140 mmol/L (ref 135–145)
Total Bilirubin: 0.3 mg/dL (ref 0.3–1.2)
Total Protein: 7.4 g/dL (ref 6.5–8.1)

## 2018-04-30 LAB — CBC WITH DIFFERENTIAL (CANCER CENTER ONLY)
Abs Immature Granulocytes: 0.03 10*3/uL (ref 0.00–0.07)
Basophils Absolute: 0 10*3/uL (ref 0.0–0.1)
Basophils Relative: 0 %
Eosinophils Absolute: 0 10*3/uL (ref 0.0–0.5)
Eosinophils Relative: 1 %
HCT: 27.9 % — ABNORMAL LOW (ref 36.0–46.0)
Hemoglobin: 9.2 g/dL — ABNORMAL LOW (ref 12.0–15.0)
Immature Granulocytes: 1 %
Lymphocytes Relative: 31 %
Lymphs Abs: 1.6 10*3/uL (ref 0.7–4.0)
MCH: 27.5 pg (ref 26.0–34.0)
MCHC: 33 g/dL (ref 30.0–36.0)
MCV: 83.3 fL (ref 80.0–100.0)
Monocytes Absolute: 0.3 10*3/uL (ref 0.1–1.0)
Monocytes Relative: 6 %
Neutro Abs: 3.1 10*3/uL (ref 1.7–7.7)
Neutrophils Relative %: 61 %
Platelet Count: 237 10*3/uL (ref 150–400)
RBC: 3.35 MIL/uL — ABNORMAL LOW (ref 3.87–5.11)
RDW: 14.4 % (ref 11.5–15.5)
WBC Count: 5.1 10*3/uL (ref 4.0–10.5)
nRBC: 0 % (ref 0.0–0.2)

## 2018-04-30 MED ORDER — SODIUM CHLORIDE 0.9% FLUSH
10.0000 mL | Freq: Once | INTRAVENOUS | Status: AC
  Administered 2018-04-30: 10 mL
  Filled 2018-04-30: qty 10

## 2018-04-30 MED ORDER — HEPARIN SOD (PORK) LOCK FLUSH 100 UNIT/ML IV SOLN
250.0000 [IU] | Freq: Once | INTRAVENOUS | Status: AC
  Administered 2018-04-30: 250 [IU]
  Filled 2018-04-30: qty 5

## 2018-04-30 NOTE — Progress Notes (Signed)
  Radiation Oncology         [phone] ________________________________  Name: Krista Johnson MRN: [id number]  Date: 04/30/2018  DOB: 05/20/1950  CC: Jonathon Jordan, MD  Everitt Amber, MD  HDR BRACHYTHERAPY NOTE  DIAGNOSIS: 68 y.o. female with Pathologic stage: pT3a, pN0, M1) Endometrial cancer. Figo Stage: IVB   Simple treatment device note: Patient had construction of her custom vaginal cylinder. She will be treated with a 2.5 cm diameter segmented cylinder. This conforms to her anatomy without undue discomfort.  Vaginal brachytherapy procedure node: The patient was brought to the [place] suite. Identity was confirmed. All relevant records and images related to the planned course of therapy were reviewed. The patient freely provided informed written consent to proceed with treatment after reviewing the details related to the planned course of therapy. The consent form was witnessed and verified by the simulation staff. Then, the patient was set-up in a stable reproducible supine position for radiation therapy. Pelvic exam revealed the vaginal cuff to be intact. The patient's custom vaginal cylinder was placed in the proximal vagina. This was affixed to the CT/MR stabilization plate to prevent slippage. Patient tolerated the placement well.  Verification simulation note:  A fiducial marker was placed within the vaginal cylinder. An AP and lateral film was then obtained through the pelvis area. This documented accurate position of the vaginal cylinder for treatment.  HDR BRACHYTHERAPY TREATMENT  The remote afterloading device was affixed to the vaginal cylinder by catheter. Patient then proceeded to undergo her fifth high-dose-rate treatment directed at the proximal vagina. The patient was prescribed a dose of 6 gray to be delivered to the mucosal surface. Treatment length was 4 cm. Patient was treated with 1 channel using 9 dwell positions. Treatment time was 169.70 seconds. Iridium 192 was the  high-dose-rate source for treatment. The patient tolerated the treatment well. After completion of her therapy, a radiation survey was performed documenting return of the iridium source into the Gamma[employer] safe.   PLAN: This is the patient's final HDR treatment. Follow-up in 1 month in radiation oncology. ________________________________  Blair Promise, PhD, MD   This document serves as a record of services personally performed by Gery Pray, MD. It was created on his behalf by Mary-Margaret Loma Messing, a trained medical scribe. The creation of this record is based on the scribe's personal observations and the provider's statements to them. This document has been checked and approved by the attending provider.

## 2018-05-01 ENCOUNTER — Encounter: Payer: Self-pay | Admitting: Hematology and Oncology

## 2018-05-01 ENCOUNTER — Inpatient Hospital Stay: Payer: Medicare Other

## 2018-05-01 ENCOUNTER — Inpatient Hospital Stay (HOSPITAL_BASED_OUTPATIENT_CLINIC_OR_DEPARTMENT_OTHER): Payer: Medicare Other | Admitting: Hematology and Oncology

## 2018-05-01 ENCOUNTER — Telehealth: Payer: Self-pay | Admitting: Hematology and Oncology

## 2018-05-01 VITALS — HR 94

## 2018-05-01 DIAGNOSIS — D638 Anemia in other chronic diseases classified elsewhere: Secondary | ICD-10-CM

## 2018-05-01 DIAGNOSIS — C773 Secondary and unspecified malignant neoplasm of axilla and upper limb lymph nodes: Secondary | ICD-10-CM

## 2018-05-01 DIAGNOSIS — K769 Liver disease, unspecified: Secondary | ICD-10-CM

## 2018-05-01 DIAGNOSIS — C541 Malignant neoplasm of endometrium: Secondary | ICD-10-CM

## 2018-05-01 DIAGNOSIS — R590 Localized enlarged lymph nodes: Secondary | ICD-10-CM

## 2018-05-01 DIAGNOSIS — E1122 Type 2 diabetes mellitus with diabetic chronic kidney disease: Secondary | ICD-10-CM

## 2018-05-01 DIAGNOSIS — Z794 Long term (current) use of insulin: Secondary | ICD-10-CM | POA: Diagnosis not present

## 2018-05-01 DIAGNOSIS — E1165 Type 2 diabetes mellitus with hyperglycemia: Secondary | ICD-10-CM

## 2018-05-01 DIAGNOSIS — C786 Secondary malignant neoplasm of retroperitoneum and peritoneum: Secondary | ICD-10-CM

## 2018-05-01 DIAGNOSIS — IMO0001 Reserved for inherently not codable concepts without codable children: Secondary | ICD-10-CM

## 2018-05-01 DIAGNOSIS — Z5111 Encounter for antineoplastic chemotherapy: Secondary | ICD-10-CM | POA: Diagnosis not present

## 2018-05-01 DIAGNOSIS — Z79899 Other long term (current) drug therapy: Secondary | ICD-10-CM

## 2018-05-01 DIAGNOSIS — N183 Chronic kidney disease, stage 3 unspecified: Secondary | ICD-10-CM

## 2018-05-01 MED ORDER — DIPHENHYDRAMINE HCL 50 MG/ML IJ SOLN
INTRAMUSCULAR | Status: AC
  Filled 2018-05-01: qty 1

## 2018-05-01 MED ORDER — SODIUM CHLORIDE 0.9 % IV SOLN
Freq: Once | INTRAVENOUS | Status: AC
  Administered 2018-05-01: 11:00:00 via INTRAVENOUS
  Filled 2018-05-01: qty 5

## 2018-05-01 MED ORDER — SODIUM CHLORIDE 0.9 % IV SOLN
445.2000 mg | Freq: Once | INTRAVENOUS | Status: AC
  Administered 2018-05-01: 450 mg via INTRAVENOUS
  Filled 2018-05-01: qty 45

## 2018-05-01 MED ORDER — SODIUM CHLORIDE 0.9 % IV SOLN
140.0000 mg/m2 | Freq: Once | INTRAVENOUS | Status: AC
  Administered 2018-05-01: 282 mg via INTRAVENOUS
  Filled 2018-05-01: qty 47

## 2018-05-01 MED ORDER — DIPHENHYDRAMINE HCL 50 MG/ML IJ SOLN
50.0000 mg | Freq: Once | INTRAMUSCULAR | Status: AC
  Administered 2018-05-01: 50 mg via INTRAVENOUS

## 2018-05-01 MED ORDER — FAMOTIDINE IN NACL 20-0.9 MG/50ML-% IV SOLN
20.0000 mg | Freq: Once | INTRAVENOUS | Status: AC
  Administered 2018-05-01: 20 mg via INTRAVENOUS

## 2018-05-01 MED ORDER — FAMOTIDINE IN NACL 20-0.9 MG/50ML-% IV SOLN
INTRAVENOUS | Status: AC
  Filled 2018-05-01: qty 50

## 2018-05-01 MED ORDER — PALONOSETRON HCL INJECTION 0.25 MG/5ML
0.2500 mg | Freq: Once | INTRAVENOUS | Status: AC
  Administered 2018-05-01: 0.25 mg via INTRAVENOUS

## 2018-05-01 MED ORDER — SODIUM CHLORIDE 0.9 % IV SOLN
Freq: Once | INTRAVENOUS | Status: AC
  Administered 2018-05-01: 11:00:00 via INTRAVENOUS
  Filled 2018-05-01: qty 250

## 2018-05-01 MED ORDER — HEPARIN SOD (PORK) LOCK FLUSH 100 UNIT/ML IV SOLN
500.0000 [IU] | Freq: Once | INTRAVENOUS | Status: AC | PRN
  Administered 2018-05-01: 500 [IU]
  Filled 2018-05-01: qty 5

## 2018-05-01 MED ORDER — PALONOSETRON HCL INJECTION 0.25 MG/5ML
INTRAVENOUS | Status: AC
  Filled 2018-05-01: qty 5

## 2018-05-01 MED ORDER — SODIUM CHLORIDE 0.9% FLUSH
10.0000 mL | INTRAVENOUS | Status: DC | PRN
  Administered 2018-05-01: 10 mL
  Filled 2018-05-01: qty 10

## 2018-05-01 NOTE — Assessment & Plan Note (Signed)
I have reviewed imaging study with the patient She have stable disease on imaging She will continue her treatment for 3 more cycles before repeat PET scan again

## 2018-05-01 NOTE — Assessment & Plan Note (Signed)
She had recent stable mild chronic renal failure We discussed importance of adequate hydration

## 2018-05-01 NOTE — Assessment & Plan Note (Signed)
This is likely anemia of chronic disease. The patient denies recent history of bleeding such as epistaxis, hematuria or hematochezia. She is asymptomatic from the anemia. We will observe for now.  

## 2018-05-01 NOTE — Telephone Encounter (Signed)
Gave avs and calendar ° °

## 2018-05-01 NOTE — Assessment & Plan Note (Signed)
She has persistent lymphadenopathy, stable It is not bothering her Observe only for now

## 2018-05-01 NOTE — Patient Instructions (Signed)
[facility] Discharge Instructions for Patients Receiving Chemotherapy  Today you received the following chemotherapy agents Paclitaxel (TAXOL) & Carboplatin (PARAPLATIN).  To help prevent nausea and vomiting after your treatment, we encourage you to take your nausea medication as prescribed.  If you develop nausea and vomiting that is not controlled by your nausea medication, call the clinic.   BELOW ARE SYMPTOMS THAT SHOULD BE REPORTED IMMEDIATELY:  *FEVER GREATER THAN 100.5 F  *CHILLS WITH OR WITHOUT FEVER  NAUSEA AND VOMITING THAT IS NOT CONTROLLED WITH YOUR NAUSEA MEDICATION  *UNUSUAL SHORTNESS OF BREATH  *UNUSUAL BRUISING OR BLEEDING  TENDERNESS IN MOUTH AND THROAT WITH OR WITHOUT PRESENCE OF ULCERS  *URINARY PROBLEMS  *BOWEL PROBLEMS  UNUSUAL RASH Items with * indicate a potential emergency and should be followed up as soon as possible.  Feel free to call the clinic should you have any questions or concerns. The clinic phone number is [phone].  Please show the CHEMO ALERT CARD at check-in to the Emergency Department and triage nurse.   

## 2018-05-01 NOTE — Progress Notes (Signed)
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[facility]) She has persistent lymphadenopathy, stable It is not bothering her Observe only for now  Diabetes mellitus, insulin dependent (IDDM), uncontrolled ([place]) She has stable diabetes while on treatment and takes additional doses of insulin as needed for the first few days after chemo I will continue on reduced dose of dexamethasone We discussed importance of dietary modification while on treatment  Chronic kidney disease (CKD), stage III (moderate) ([place]) She had recent stable mild chronic renal failure We discussed importance of adequate hydration  Anemia, chronic disease This is likely anemia of chronic disease. The patient denies recent history of bleeding such as epistaxis, hematuria or hematochezia. She is asymptomatic from the anemia. We will observe for now.     No orders of the defined types were placed in this encounter.   INTERVAL HISTORY: Please see below for problem oriented charting. 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 Leo Grosser who obtained an endometrial biopsy showing poorly differentiated endometrial carcinoma and negative endocervical curettage    12/12/2017 Imaging    MAMMOGRAM FINDINGS: In the right axilla, a possible mass warrants further evaluation. In the left breast, no findings suspicious for malignancy.  Images were processed with CAD.  IMPRESSION: Further evaluation is suggested for possible mass in the right axilla.     12/24/2017 Imaging    Ct scan chest, abdomen and pelvis 1. Marked thickening of the endometrium (42 mm) compatible with known primary endometrial malignancy. No evidence of extrauterine invasion. 2. No pelvic or retroperitoneal adenopathy. 3. Right middle lobe 4 mm solid pulmonary nodule, for which follow-up chest CT is advised in 3-6 months. 4. Several findings that are equivocal for distant metastatic disease. Vaguely nodular heterogeneous hyperenhancement in the peripheral right liver lobe, which could represent benign transient perfusional phenomena, with underlying liver lesions not entirely excluded. Mildly sclerotic T12 vertebral lesion. Mildly enlarged right axillary lymph node. The best single test to further evaluate these findings would be a PET-CT. Alternative tests include bone scan or thoracic MRI without and with IV contrast for the T12 osseous lesion, MRI abdomen without and with IV contrast for the liver findings, and diagnostic mammographic evaluation for the right axillary node. 5.  Aortic Atherosclerosis (ICD10-I70.0).     01/09/2018 PET scan    1. Moderate hypermetabolism corresponding to enlarging axillary node since 12/22/2017. Highly suspicious for an atypical distribution of metastatic disease. 2. No hypermetabolism to suggest hepatic or T12 osseous metastasis. 3. Hypermetabolic endometrial primary.    01/23/2018 Initial Diagnosis    Endometrial cancer ([place])  01/23/2018 Pathology Results    1. Lymph node, sentinel, biopsy, left  external iliac - NO CARCINOMA IDENTIFIED IN ONE LYMPH NODE (0/1) - SEE COMMENT 2. Lymph nodes, regional resection, right para aortic - NO CARCINOMA IDENTIFIED IN FOUR LYMPH NODES (0/4) - SEE COMMENT 3. Lymph nodes, regional resection, right pelvic - NO CARCINOMA IDENTIFIED IN EIGHT LYMPH NODES (0/8) - SEE COMMENT 4. Cul-de-sac biopsy - METASTATIC CARCINOMA 5. Uterus +/- tubes/ovaries, neoplastic, cervix, bilateral fallopian tubes and ovaries UTERUS: - MIXED SEROUS AND ENDOMETRIOID CARCINOMA - SEROSAL IMPLANTS PRESENT - LYMPHOVASCULAR SPACE INVASION PRESENT - LEIOMYOMATA (1.5 CM; LARGEST) - SEE ONCOLOGY TABLE AND COMMENT BELOW CERVIX: - BENIGN NABOTHIAN CYSTS - NO CARCINOMA IDENTIFIED BILATERAL OVARIES: - METASTATIC CARCINOMA PRESENT ON OVARIAN SURFACE BILATERAL FALLOPIAN TUBES: - INTRALUMINAL CARCINOMA Microscopic Comment 1. -3. Cytokeratin AE1/3 was performed on the sentinel lymph nodes to exclude micrometastasis. There is no evidence of metastatic carcinoma by immunohistochemistry. 5. 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%) Histologic Grade: N/A Myometrial invasion: Estimated less than 50% myometrial invasion (0.3 cm of myometrium involved; 1.4 cm measured thickness) Uterine Serosa Involvement: Present Cervical stromal involvement: Not identified Extent of involvement of other organs: - Fallopian tube (left within the lumen) - Ovary, left (surface involvement) - Cul-de-sac Lymphovascular invasion: Present Regional Lymph Nodes: Examined: 1 Sentinel 12 Non-sentinel 13 Total Lymph nodes with metastasis: 0 Isolated tumor cells (< 0.2 mm): 0 Micrometastasis: (> 0.2 mm and < 2.0 mm): 0 Macrometastasis: (> 2.0 mm): 0 Extracapsular  extension: N/A Tumor block for ancillary studies: 5E, 5B MMR / MSI testing: Pending will be reported separately Pathologic Stage Classification (pTNM, AJCC 8th edition): pT3a, pN0 FIGO Stage: IIIA COMMENT: There is tumor present on the surface of the left ovary and within the lumen of the left fallopian tube. The carcinoma appears to be mixed with the largest component being high grade serous carcinoma. Dr. Lyndon Code reviewed the case and agrees with the above diagnosis.    01/23/2018 Surgery    Surgeon: Donaciano Eva   Operation: Robotic-assisted laparoscopic total hysterectomy with bilateral salpingoophorectomy, SLN injection, mapping and biopsy, right pelvic and para-aortic lymphadenectomy  Operative Findings:  : 10-12cm bulky uterus with frank serosal involvement on posterior cul de sac peritoneum and anterior peritoneum which adhesed the bladder to the anterior uterus. Unilateral mapping on left pelvis. No grossly suspicious nodes. Normal omentum and diaphragms.     02/01/2018 Pathology Results    Lymph node, needle/core biopsy, right axilla - METASTATIC CARCINOMA - SEE COMMENT Microscopic Comment The neoplastic cells are positive for cytokeratin 7 and Pax-8 but negative for cytokeratin 5/6, cytokeratin 20, Gata-3, p63, p53 and GCDFP. Overall, the immunoprofile is consistent with metastasis from the patient's known gynecologic carcinoma.    02/01/2018 Procedure    Ultrasound-guided core biopsies of a suspicious right axillary lymph node.    02/21/2018 -  Chemotherapy    The patient had carboplatin & Taxol    04/26/2018 PET scan    1. Interval resection of the hypermetabolic endometrial primary. No discernible hypermetabolic pelvic sidewall or peritoneal metastases. 2. Stable hypermetabolic bilateral axillary lymphadenopathy. The degree of hypermetabolism in these lymph nodes remains highly suspicious for neoplasm. 3. Interval development of low level FDG uptake in 2 stable, small  lymph nodes in the left groin region. This may be reactive, but close attention recommended  to exclude metastatic involvement. 4. Stable non hypermetabolic low-density liver lesions and the mixed lucent and sclerotic T12 lesion is stable without hypermetabolism today.     REVIEW OF SYSTEMS:   Constitutional: Denies fevers, chills or abnormal weight loss Eyes: Denies blurriness of vision Ears, nose, mouth, throat, and face: Denies mucositis or sore throat Respiratory: Denies cough, dyspnea or wheezes Cardiovascular: Denies palpitation, chest discomfort or lower extremity swelling Gastrointestinal:  Denies nausea, heartburn or change in bowel habits Skin: Denies abnormal skin rashes Lymphatics: Denies new lymphadenopathy or easy bruising Neurological:Denies numbness, tingling or new weaknesses Behavioral/Psych: Mood is stable, no new changes  All other systems were reviewed with the patient and are negative.  I have reviewed the past medical history, past surgical history, social history and family history with the patient and they are unchanged from previous note.  ALLERGIES:  is allergic to sulfa antibiotics and sulfamethoxazole.  MEDICATIONS:  Current Outpatient Medications  Medication Sig Dispense Refill  . blood glucose meter kit and supplies KIT Dispense based on patient and insurance preference. Use up to four times daily as directed. (FOR ICD-9 250.00, 250.01). 1 each 0  . dexamethasone (DECADRON) 4 MG tablet Take 2 tabs at the night before and 2 tabs the morning of chemotherapy, every 3 weeks, by mouth 24 tablet 0  . Insulin Isophane & Regular Human (NOVOLIN 70/30 FLEXPEN) (70-30) 100 UNIT/ML PEN Inject 25 Units into the skin daily with breakfast. Take 35 units on chemo days.  also pen needles 1/day 15 mL 11  . ketoconazole (NIZORAL) 2 % cream Apply to web space twice a day for 1 week then daily for 1 month (Patient taking differently: Apply 1 application topically daily as needed  for irritation. Apply to web space twice a day for 1 week then daily for 1 month) 15 g 0  . lidocaine-prilocaine (EMLA) cream Apply to affected area once 30 g 3  . lisinopril-hydrochlorothiazide (PRINZIDE,ZESTORETIC) 20-12.5 MG tablet Take 1 tablet by mouth daily. (Patient taking differently: Take 1 tablet by mouth every morning. ) 30 tablet 11  . ondansetron (ZOFRAN) 8 MG tablet Take 1 tablet (8 mg total) by mouth every 8 (eight) hours as needed for refractory nausea / vomiting. Start on day 3 after chemo. 30 tablet 1  . ONETOUCH DELICA LANCETS 40J MISC 3 (three) times daily. as directed  5  . ONETOUCH VERIO test strip 3 (three) times daily. as directed  5  . prochlorperazine (COMPAZINE) 10 MG tablet Take 1 tablet (10 mg total) by mouth every 6 (six) hours as needed (Nausea or vomiting). 30 tablet 1  . RELION PEN NEEDLES 32G X 4 MM MISC See admin instructions.  11  . senna-docusate (SENOKOT-S) 8.6-50 MG tablet Take 1 tablet by mouth at bedtime as needed for mild constipation or moderate constipation. 20 tablet 0  . simvastatin (ZOCOR) 10 MG tablet Take 10 mg by mouth at bedtime.     . Vitamin D, Ergocalciferol, 2000 units CAPS Take 2,000 Units by mouth daily.   0   No current facility-administered medications for this visit.    Facility-Administered Medications Ordered in Other Visits  Medication Dose Route Frequency Provider Last Rate Last Dose  . CARBOplatin (PARAPLATIN) 450 mg in sodium chloride 0.9 % 250 mL chemo infusion  450 mg Intravenous Once Alvy Bimler, Xcaret Morad, MD      . famotidine (PEPCID) IVPB 20 mg premix  20 mg Intravenous Once Alvy Bimler, Taevyn Hausen, MD 200 mL/hr at 05/01/18 1106 20 mg at  05/01/18 1106  . fosaprepitant (EMEND) 150 mg, dexamethasone (DECADRON) 4 mg in sodium chloride 0.9 % 145 mL IVPB   Intravenous Once Alvy Bimler, Myriah Boggus, MD      . heparin lock flush 100 unit/mL  500 Units Intracatheter Once PRN Alvy Bimler, Loki Wuthrich, MD      . PACLitaxel (TAXOL) 282 mg in sodium chloride 0.9 % 250 mL chemo infusion  (> 46m/m2)  140 mg/m2 (Treatment Plan Recorded) Intravenous Once GAlvy Bimler Phylisha Dix, MD      . sodium chloride flush (NS) 0.9 % injection 10 mL  10 mL Intracatheter PRN GAlvy Bimler Delayza Lungren, MD        PHYSICAL EXAMINATION: ECOG PERFORMANCE STATUS: 1 - Symptomatic but completely ambulatory  Vitals:   05/01/18 1017  BP: 110/74  Pulse: (!) 108  Resp: 18  Temp: 97.9 F (36.6 C)  SpO2: 100%   Filed Weights   05/01/18 1017  Weight: 189 lb 12.8 oz (86.1 kg)    GENERAL:alert, no distress and comfortable SKIN: skin color, texture, turgor are normal, no rashes or significant lesions EYES: normal, Conjunctiva are pink and non-injected, sclera clear OROPHARYNX:no exudate, no erythema and lips, buccal mucosa, and tongue normal  NECK: supple, thyroid normal size, non-tender, without nodularity LYMPH:  no palpable lymphadenopathy in the cervical, axillary or inguinal LUNGS: clear to auscultation and percussion with normal breathing effort HEART: regular rate & rhythm and no murmurs and no lower extremity edema ABDOMEN:abdomen soft, non-tender and normal bowel sounds Musculoskeletal:no cyanosis of digits and no clubbing  NEURO: alert & oriented x 3 with fluent speech, no focal motor/sensory deficits  LABORATORY DATA:  I have reviewed the data as listed    Component Value Date/Time   NA 140 04/30/2018 1004   K 4.1 04/30/2018 1004   CL 107 04/30/2018 1004   CO2 24 04/30/2018 1004   GLUCOSE 158 (H) 04/30/2018 1004   BUN 35 (H) 04/30/2018 1004   CREATININE 1.50 (H) 04/30/2018 1004   CALCIUM 10.2 04/30/2018 1004   PROT 7.4 04/30/2018 1004   ALBUMIN 3.3 (L) 04/30/2018 1004   AST 14 (L) 04/30/2018 1004   ALT 11 04/30/2018 1004   ALKPHOS 87 04/30/2018 1004   BILITOT 0.3 04/30/2018 1004   GFRNONAA 36 (L) 04/30/2018 1004   GFRAA 41 (L) 04/30/2018 1004    No results found for: SPEP, UPEP  Lab Results  Component Value Date   WBC 5.1 04/30/2018   NEUTROABS 3.1 04/30/2018   HGB 9.2 (L) 04/30/2018    HCT 27.9 (L) 04/30/2018   MCV 83.3 04/30/2018   PLT 237 04/30/2018      Chemistry      Component Value Date/Time   NA 140 04/30/2018 1004   K 4.1 04/30/2018 1004   CL 107 04/30/2018 1004   CO2 24 04/30/2018 1004   BUN 35 (H) 04/30/2018 1004   CREATININE 1.50 (H) 04/30/2018 1004      Component Value Date/Time   CALCIUM 10.2 04/30/2018 1004   ALKPHOS 87 04/30/2018 1004   AST 14 (L) 04/30/2018 1004   ALT 11 04/30/2018 1004   BILITOT 0.3 04/30/2018 1004       RADIOGRAPHIC STUDIES: I have personally reviewed the radiological images as listed and agreed with the findings in the report. Nm Pet Image Restag (ps) Skull Base To Thigh  Result Date: 04/26/2018 CLINICAL DATA:  Subsequent treatment strategy for endometrial cancer. EXAM: NUCLEAR MEDICINE PET SKULL BASE TO THIGH TECHNIQUE: 10.3 mCi F-18 FDG was injected intravenously. Full-ring PET imaging was  performed from the skull base to thigh after the radiotracer. CT data was obtained and used for attenuation correction and anatomic localization. Fasting blood glucose: 173 mg/dl COMPARISON:  01/09/2018. FINDINGS: Mediastinal blood pool activity: SUV max 3.3 NECK: No hypermetabolic lymph nodes in the neck. Incidental CT findings: none CHEST: Similar appearance of the right axillary lymph node now measuring 17 mm short axis compared to 15 mm previously. SUV max = 8 on today's study, unchanged from prior. Index lymph node in the left axilla is 12 mm short axis today, stable since prior. Lymph node remains hypermetabolic with SUV max = 8 on today's exam compared to 11.6 previously. Incidental CT findings: Right Port-A-Cath tip is positioned in the distal SVC. 6 mm anterior right perifissural nodule (30/8) is stable in the interval. No new pulmonary nodule or mass. ABDOMEN/PELVIS: Interval resection of the large hypermetabolic uterine mass seen previously. No definite hypermetabolic peritoneal disease in the pelvis. There is a focus of FDG accumulation  along the right wall of the distal rectum, nonspecific with no mass lesion evident at this location by CT. Similar appearance of lymph nodes in the groin regions. 10 mm short axis left groin node (167/4) is unchanged in size in the interval but demonstrates FDG accumulation today with SUV max = 3.6. 7 mm short axis left groin node on 162/4) is also stable in size with low level FDG uptake today ( SUV max = 3.2. Incidental CT findings: Stable appearance of small low-density liver lesions demonstrate no convincing hypermetabolism on PET sequences. Stable right renal cyst. SKELETON: No focal hypermetabolic activity to suggest skeletal metastasis. Incidental CT findings: The mixed lucent and sclerotic lesion in the T12 vertebral body is stable and shows no hypermetabolism. IMPRESSION: 1. Interval resection of the hypermetabolic endometrial primary. No discernible hypermetabolic pelvic sidewall or peritoneal metastases. 2. Stable hypermetabolic bilateral axillary lymphadenopathy. The degree of hypermetabolism in these lymph nodes remains highly suspicious for neoplasm. 3. Interval development of low level FDG uptake in 2 stable, small lymph nodes in the left groin region. This may be reactive, but close attention recommended to exclude metastatic involvement. 4. Stable non hypermetabolic low-density liver lesions and the mixed lucent and sclerotic T12 lesion is stable without hypermetabolism today. Electronically Signed   By: Misty Stanley M.D.   On: 04/26/2018 20:11    All questions were answered. The patient knows to call the clinic with any problems, questions or concerns. No barriers to learning was detected.  I spent 25 minutes counseling the patient face to face. The total time spent in the appointment was 30 minutes and more than 50% was on counseling and review of test results  [name], MD 05/01/2018 11:11 AM

## 2018-05-01 NOTE — Progress Notes (Signed)
Ok to treat today with creatinine at 1.5. No new orders.

## 2018-05-01 NOTE — Assessment & Plan Note (Signed)
She has stable diabetes while on treatment and takes additional doses of insulin as needed for the first few days after chemo I will continue on reduced dose of dexamethasone We discussed importance of dietary modification while on treatment

## 2018-05-02 ENCOUNTER — Encounter: Payer: Self-pay | Admitting: Endocrinology

## 2018-05-02 ENCOUNTER — Ambulatory Visit (INDEPENDENT_AMBULATORY_CARE_PROVIDER_SITE_OTHER): Payer: Medicare Other | Admitting: Endocrinology

## 2018-05-02 VITALS — BP 120/78 | HR 90 | Ht 66.0 in | Wt 187.2 lb

## 2018-05-02 DIAGNOSIS — N183 Chronic kidney disease, stage 3 unspecified: Secondary | ICD-10-CM

## 2018-05-02 DIAGNOSIS — Z794 Long term (current) use of insulin: Secondary | ICD-10-CM

## 2018-05-02 DIAGNOSIS — E1122 Type 2 diabetes mellitus with diabetic chronic kidney disease: Secondary | ICD-10-CM | POA: Diagnosis not present

## 2018-05-02 LAB — POCT GLYCOSYLATED HEMOGLOBIN (HGB A1C): Hemoglobin A1C: 7.7 % — AB (ref 4.0–5.6)

## 2018-05-02 NOTE — Progress Notes (Signed)
Subjective:    Patient ID: Krista Johnson, female    DOB: 1951-01-18, 68 y.o.   MRN: [id number]  HPI Pt returns for f/u of diabetes mellitus: DM type: Insulin-requiring type 2.   Dx'ed: 4037 Complications: renal insuff Therapy: insulin since mid-2019 GDM: never (G0) DKA: never Severe hypoglycemia: never Pancreatitis: never Pancreatic imaging: normal on 2019 CT.   Other: she takes qd insulin, after poor results with multiple daily injections.   Interval history: she brings her meter with her cbg's which I have reviewed today.  It varies from 73-200's  There is no trend throughout the day.  However, she says it goes up to the 300's on chemo days.  She takes 30 units qam.   She has 2 more doses of chemo to go (she has finished XRT).  She denies hypoglycemia.   Pt also has vit-D def and Hyperparathyroidism (prob a combination of primary and secondary).  She takes vit-D, 2000 units per day.   Past Medical History:  Diagnosis Date  . Arthritis    right knee--- last cortisone injection 04/ 2019  . CKD (chronic kidney disease)   . Diabetes mellitus without complication [facility])    endocrinologist-  dr Loanne Drilling  . Endometrial cancer ([place])   . History of colon polyps   . Hyperlipidemia   . Hyperparathyroidism ([place])   . Hypertension     Past Surgical History:  Procedure Laterality Date  . COLONOSCOPY  last one 12-25-2017  . IR IMAGING GUIDED PORT INSERTION  02/20/2018  . ROBOTIC ASSISTED TOTAL HYSTERECTOMY WITH BILATERAL SALPINGO OOPHERECTOMY N/A 01/23/2018   Procedure: XI ROBOTIC ASSISTED TOTAL HYSTERECTOMY WITH BILATERAL SALPINGO OOPHORECTOMY;  Surgeon: Everitt Amber, MD;  Location: [facility];  Service: Gynecology;  Laterality: N/A;  . SENTINEL NODE BIOPSY N/A 01/23/2018   Procedure: SENTINEL NODE BIOPSY;  Surgeon: Everitt Amber, MD;  Location: [facility];  Service: Gynecology;  Laterality: N/A;    Social History   Socioeconomic History  . Marital status: Single    Spouse name: Not on file  .  Number of children: 0  . Years of education: Not on file  . Highest education level: Not on file  Occupational History  . Occupation: retired Education officer, museum  Social Needs  . Financial resource strain: Not on file  . Food insecurity:    Worry: Not on file    Inability: Not on file  . Transportation needs:    Medical: Not on file    Non-medical: Not on file  Tobacco Use  . Smoking status: Never Smoker  . Smokeless tobacco: Never Used  Substance and Sexual Activity  . Alcohol use: Not Currently    Alcohol/week: 0.0 standard drinks    Frequency: Never  . Drug use: Never  . Sexual activity: Not on file  Lifestyle  . Physical activity:    Days per week: Not on file    Minutes per session: Not on file  . Stress: Not on file  Relationships  . Social connections:    Talks on phone: Not on file    Gets together: Not on file    Attends religious service: Not on file    Active member of club or organization: Not on file    Attends meetings of clubs or organizations: Not on file    Relationship status: Not on file  . Intimate partner violence:    Fear of current or ex partner: Not on file    Emotionally abused: Not on file  Physically abused: Not on file    Forced sexual activity: Not on file  Other Topics Concern  . Not on file  Social History Narrative  . Not on file    Current Outpatient Medications on File Prior to Visit  Medication Sig Dispense Refill  . blood glucose meter kit and supplies KIT Dispense based on patient and insurance preference. Use up to four times daily as directed. (FOR ICD-9 250.00, 250.01). 1 each 0  . dexamethasone (DECADRON) 4 MG tablet Take 2 tabs at the night before and 2 tabs the morning of chemotherapy, every 3 weeks, by mouth 24 tablet 0  . Insulin Isophane & Regular Human (NOVOLIN 70/30 FLEXPEN) (70-30) 100 UNIT/ML PEN Inject 25 Units into the skin daily with breakfast. Take 35 units on chemo days.  also pen needles 1/day 15 mL 11  .  ketoconazole (NIZORAL) 2 % cream Apply to web space twice a day for 1 week then daily for 1 month (Patient taking differently: Apply 1 application topically daily as needed for irritation. Apply to web space twice a day for 1 week then daily for 1 month) 15 g 0  . lidocaine-prilocaine (EMLA) cream Apply to affected area once 30 g 3  . lisinopril-hydrochlorothiazide (PRINZIDE,ZESTORETIC) 20-12.5 MG tablet Take 1 tablet by mouth daily. (Patient taking differently: Take 1 tablet by mouth every morning. ) 30 tablet 11  . ONETOUCH DELICA LANCETS 77A MISC 3 (three) times daily. as directed  5  . ONETOUCH VERIO test strip 3 (three) times daily. as directed  5  . RELION PEN NEEDLES 32G X 4 MM MISC See admin instructions.  11  . senna-docusate (SENOKOT-S) 8.6-50 MG tablet Take 1 tablet by mouth at bedtime as needed for mild constipation or moderate constipation. 20 tablet 0  . simvastatin (ZOCOR) 10 MG tablet Take 10 mg by mouth at bedtime.     . Vitamin D, Ergocalciferol, 2000 units CAPS Take 2,000 Units by mouth daily.   0  . ondansetron (ZOFRAN) 8 MG tablet Take 1 tablet (8 mg total) by mouth every 8 (eight) hours as needed for refractory nausea / vomiting. Start on day 3 after chemo. (Patient not taking: Reported on 05/02/2018) 30 tablet 1  . prochlorperazine (COMPAZINE) 10 MG tablet Take 1 tablet (10 mg total) by mouth every 6 (six) hours as needed (Nausea or vomiting). (Patient not taking: Reported on 05/02/2018) 30 tablet 1   No current facility-administered medications on file prior to visit.     Allergies  Allergen Reactions  . Sulfa Antibiotics Nausea Only  . Sulfamethoxazole Nausea Only    Family History  Problem Relation Age of Onset  . Diabetes Mother   . Hypertension Mother   . Diabetes Sister   . Hypertension Sister   . Diabetes Maternal Uncle   . Colon cancer Paternal Aunt   . Diabetes Paternal Aunt   . Stomach cancer Neg Hx   . Rectal cancer Neg Hx   . Esophageal cancer Neg Hx     . Colon polyps Neg Hx     BP 120/78 (BP Location: Right Arm, Patient Position: Sitting, Cuff Size: Normal)   Pulse 90   Ht '5\' 6"'  (1.676 m)   Wt 187 lb 3.2 oz (84.9 kg)   BMI 30.21 kg/m    Review of Systems No weight change    Objective:   Physical Exam VITAL SIGNS:  See vs page GENERAL: no distress Pulses: dorsalis pedis intact bilat.   MSK: no  deformity of the feet CV: no leg edema Skin:  no ulcer on the feet.  normal color and temp on the feet. Neuro: sensation is intact to touch on the feet  A1c=7.7%      Assessment & Plan:  Insulin-requiring type 2 DM: this is the best control this pt should aim for, given this regimen, which does match insulin to her changing needs throughout the day Lung cancer: chemo if affecting glycemic control, but she will be finished soon.  Renal insuff: in this context, she does not need a PM insulin dose.   Patient Instructions  Please continue the same insulin: 25 units each morning.  However, take 35 units on chemo days.  check your blood sugar twice a day.  vary the time of day when you check, between before the 3 meals, and at bedtime.  also check if you have symptoms of your blood sugar being too high or too low.  please keep a record of the readings and bring it to your next appointment here (or you can bring the meter itself).  You can write it on any piece of paper.  please call us sooner if your blood sugar goes below 70, or if you have a lot of readings over 200. On this type of insulin schedule, you should eat meals on a regular schedule.  If a meal is missed or significantly delayed, your blood sugar could go low.   Please come back for a follow-up appointment in 2 months.

## 2018-05-02 NOTE — Patient Instructions (Addendum)
Please continue the same insulin: 25 units each morning.  However, take 35 units on chemo days.  check your blood sugar twice a day.  vary the time of day when you check, between before the 3 meals, and at bedtime.  also check if you have symptoms of your blood sugar being too high or too low.  please keep a record of the readings and bring it to your next appointment here (or you can bring the meter itself).  You can write it on any piece of paper.  please call us sooner if your blood sugar goes below 70, or if you have a lot of readings over 200. On this type of insulin schedule, you should eat meals on a regular schedule.  If a meal is missed or significantly delayed, your blood sugar could go low.   Please come back for a follow-up appointment in 2 months.

## 2018-05-08 ENCOUNTER — Ambulatory Visit (HOSPITAL_COMMUNITY): Payer: Medicare Other

## 2018-05-16 ENCOUNTER — Inpatient Hospital Stay: Payer: Medicare Other

## 2018-05-16 ENCOUNTER — Telehealth: Payer: Self-pay | Admitting: Oncology

## 2018-05-16 DIAGNOSIS — K769 Liver disease, unspecified: Secondary | ICD-10-CM | POA: Diagnosis not present

## 2018-05-16 DIAGNOSIS — R351 Nocturia: Secondary | ICD-10-CM

## 2018-05-16 DIAGNOSIS — C773 Secondary and unspecified malignant neoplasm of axilla and upper limb lymph nodes: Secondary | ICD-10-CM | POA: Diagnosis not present

## 2018-05-16 DIAGNOSIS — C541 Malignant neoplasm of endometrium: Secondary | ICD-10-CM

## 2018-05-16 DIAGNOSIS — C786 Secondary malignant neoplasm of retroperitoneum and peritoneum: Secondary | ICD-10-CM | POA: Diagnosis not present

## 2018-05-16 DIAGNOSIS — R590 Localized enlarged lymph nodes: Secondary | ICD-10-CM | POA: Diagnosis not present

## 2018-05-16 DIAGNOSIS — Z5111 Encounter for antineoplastic chemotherapy: Secondary | ICD-10-CM | POA: Diagnosis not present

## 2018-05-16 LAB — URINALYSIS, COMPLETE (UACMP) WITH MICROSCOPIC
Bilirubin Urine: NEGATIVE
Glucose, UA: 50 mg/dL — AB
Ketones, ur: NEGATIVE mg/dL
Nitrite: POSITIVE — AB
Protein, ur: 100 mg/dL — AB
Specific Gravity, Urine: 1.016 (ref 1.005–1.030)
WBC, UA: >50 WBC/hpf — ABNORMAL HIGH (ref 0–5)
pH: 5 (ref 5.0–8.0)

## 2018-05-16 NOTE — Telephone Encounter (Signed)
Krista Johnson called and said she is having to urinate every 2-3 hours during the night and is also having burning at the end of urinating during the day.  She is wondering if this is from radiation or if she has a UTI.  Asked if she would be able to come to the cancer center for a UA/Culture today per Dr. Sondra Come and she said she is available at 2:30.

## 2018-05-17 ENCOUNTER — Telehealth: Payer: Self-pay | Admitting: Oncology

## 2018-05-17 ENCOUNTER — Other Ambulatory Visit: Payer: Self-pay | Admitting: Radiation Oncology

## 2018-05-17 MED ORDER — NITROFURANTOIN MONOHYD MACRO 100 MG PO CAPS: 100.0000 mg | ORAL_CAPSULE | Freq: Two times a day (BID) | ORAL | 0 refills | Status: DC

## 2018-05-17 NOTE — Telephone Encounter (Addendum)
Called Krista Johnson and advised her that she does have a UTI.  Let her know that Dr. Sondra Come has sent in a prescription for Macrobid to [facility] on [address].  She verbalized agreement and understanding.  She also mentioned that she had left 2 voicemail messages in Radiation Oncology on Monday, 05/14/18 about her symptoms and did not receive a call back. Apologized and advised her to call me if she needs anything.

## 2018-05-18 ENCOUNTER — Telehealth: Payer: Self-pay | Admitting: Oncology

## 2018-05-18 LAB — URINE CULTURE: Culture: >=100000 — AB

## 2018-05-18 NOTE — Telephone Encounter (Signed)
Left a message for Kymari advising her the antibiotic she is taking (MacroBid) will cover the infections that she has per Dr. Sondra Come.

## 2018-05-21 ENCOUNTER — Ambulatory Visit
Admission: RE | Admit: 2018-05-21 | Discharge: 2018-05-21 | Disposition: A | Discharge status: Home / Self Care | Payer: Medicare Other | Source: Ambulatory Visit | Attending: Radiation Oncology | Admitting: Radiation Oncology

## 2018-05-21 ENCOUNTER — Encounter: Payer: Self-pay | Admitting: Radiation Oncology

## 2018-05-21 ENCOUNTER — Other Ambulatory Visit: Payer: Self-pay

## 2018-05-21 ENCOUNTER — Telehealth: Payer: Self-pay | Admitting: *Deleted

## 2018-05-21 VITALS — BP 127/81 | HR 110 | Temp 97.6°F | Resp 20 | Ht 66.0 in | Wt 184.6 lb

## 2018-05-21 DIAGNOSIS — C541 Malignant neoplasm of endometrium: Secondary | ICD-10-CM | POA: Diagnosis not present

## 2018-05-21 DIAGNOSIS — K769 Liver disease, unspecified: Secondary | ICD-10-CM | POA: Insufficient documentation

## 2018-05-21 DIAGNOSIS — Z794 Long term (current) use of insulin: Secondary | ICD-10-CM | POA: Insufficient documentation

## 2018-05-21 DIAGNOSIS — Z79899 Other long term (current) drug therapy: Secondary | ICD-10-CM | POA: Diagnosis not present

## 2018-05-21 DIAGNOSIS — Z923 Personal history of irradiation: Secondary | ICD-10-CM | POA: Insufficient documentation

## 2018-05-21 DIAGNOSIS — R590 Localized enlarged lymph nodes: Secondary | ICD-10-CM | POA: Insufficient documentation

## 2018-05-21 DIAGNOSIS — Z792 Long term (current) use of antibiotics: Secondary | ICD-10-CM | POA: Diagnosis not present

## 2018-05-21 DIAGNOSIS — N281 Cyst of kidney, acquired: Secondary | ICD-10-CM | POA: Diagnosis not present

## 2018-05-21 DIAGNOSIS — N390 Urinary tract infection, site not specified: Secondary | ICD-10-CM | POA: Diagnosis not present

## 2018-05-21 DIAGNOSIS — Z08 Encounter for follow-up examination after completed treatment for malignant neoplasm: Secondary | ICD-10-CM | POA: Diagnosis not present

## 2018-05-21 NOTE — Progress Notes (Signed)
Pt presents today for f/u with Dr. Sondra Come. Pt is unaccompanied. Pt reports that antibiotic has almost completely resolved dysuria. Pt denies c/o pain. Pt has chemotherapy tomorrow. Pt education given on vaginal dilator with good understanding. Pt denies hematuria. Pt denies vaginal bleeding/discharge. Pt reports hemmoroids that rarely bleed. Pt denies diarrhea/constipation. Pt denies abdominal bloating.   BP 127/81 (BP Location: Right Arm, Patient Position: Sitting)   Pulse (!) 110   Temp 97.6 F (36.4 C) (Oral)   Resp 20   Ht 5\' 6"  (1.676 m)   Wt 184 lb 9.6 oz (83.7 kg)   SpO2 100%   BMI 29.80 kg/m   Wt Readings from Last 3 Encounters:  05/21/18 184 lb 9.6 oz (83.7 kg)  05/02/18 187 lb 3.2 oz (84.9 kg)  05/01/18 189 lb 12.8 oz (86.1 kg)   Loma Sousa, RN BSN

## 2018-05-21 NOTE — Progress Notes (Signed)
  Radiation Oncology         (336) [phone] ________________________________  Name: Krista Johnson MRN: [id number]  Date: 05/21/2018  DOB: 04/05/1951  End of Treatment Note  Diagnosis:   Pathologic stage: pT3a, pN0, M1) Endometrial cancer. Figo Stage: IVB     Indication for treatment:  Risk for vaginal cuff recurrence       Radiation treatment dates:   03/26/18, 04/02/18, 04/19/18, 04/23/18 04/30/18  Site/dose: proximal Vagina, 6 Gy in 5 fractions for a total dose of 30 Gy  Beams/energy:   HDR Ir-Vaginal, Iridium HDR, patient was treated with a 2.5 cm diameter segmented cylinder, Rx length of 4 cm, prescription was 6 gray to the mucosal surface  Narrative: The patient tolerated radiation treatment relatively well.     At the beginning of treatment, pt reported difficulty sleeping due to steroid use with chemotherapy, but she denied fatigue. Pt denied dysuria and hematuria throughout treatments. She had some constipation which was relieved by OTC stool softener.  Overall the pt was without complaints.   Plan: The patient has completed radiation treatment. The patient will return to radiation oncology clinic for routine followup in one month. I advised them to call or return sooner if they have any questions or concerns related to their recovery or treatment.  -----------------------------------  Blair Promise, PhD, MD  This document serves as a record of services personally performed by Gery Pray, MD. It was created on his behalf by Mary-Margaret Loma Messing, a trained medical scribe. The creation of this record is based on the scribe's personal observations and the provider's statements to them. This document has been checked and approved by the attending provider.

## 2018-05-21 NOTE — Telephone Encounter (Signed)
Entered in error

## 2018-05-21 NOTE — Progress Notes (Signed)
Radiation Oncology         (336) [phone] ________________________________  Name: Krista Johnson MRN: [id number]  Date: 05/21/2018  DOB: April 16, 1951  Follow-Up Visit Note  CC: Jonathon Jordan, MD  Jonathon Jordan, MD    ICD-10-CM   1. Endometrial cancer ([facility]) C54.1     Diagnosis:   Pathologic stage: pT3a, pN0, M1, Endometrial cancer. Figo Stage: IVB   Interval Since Last Radiation:  3 weeks  03/26/18, 04/02/18, 04/19/18, 04/23/18 04/30/18  Narrative:  The patient returns today for routine follow-up. She was having significant urinary frequency and dysuria, which a urinalysis revealed was E.coli. Her symptoms have since resolved since being on antibiotics.   On review of systems, she reports feeling really good following her recent treatment completion. she denies hematuria and any other symptoms. Pertinent positives are listed and detailed within the above HPI.                 ALLERGIES:  is allergic to sulfa antibiotics and sulfamethoxazole.  Meds: Current Outpatient Medications  Medication Sig Dispense Refill  . blood glucose meter kit and supplies KIT Dispense based on patient and insurance preference. Use up to four times daily as directed. (FOR ICD-9 250.00, 250.01). 1 each 0  . dexamethasone (DECADRON) 4 MG tablet Take 2 tabs at the night before and 2 tabs the morning of chemotherapy, every 3 weeks, by mouth 24 tablet 0  . Insulin Isophane & Regular Human (NOVOLIN 70/30 FLEXPEN) (70-30) 100 UNIT/ML PEN Inject 25 Units into the skin daily with breakfast. Take 35 units on chemo days.  also pen needles 1/day 15 mL 11  . ketoconazole (NIZORAL) 2 % cream Apply to web space twice a day for 1 week then daily for 1 month (Patient taking differently: Apply 1 application topically daily as needed for irritation. Apply to web space twice a day for 1 week then daily for 1 month) 15 g 0  . lidocaine-prilocaine (EMLA) cream Apply to affected area once 30 g 3  . lisinopril-hydrochlorothiazide  (PRINZIDE,ZESTORETIC) 20-12.5 MG tablet Take 1 tablet by mouth daily. (Patient taking differently: Take 1 tablet by mouth every morning. ) 30 tablet 11  . nitrofurantoin, macrocrystal-monohydrate, (MACROBID) 100 MG capsule Take 1 capsule (100 mg total) by mouth 2 (two) times daily. 14 capsule 0  . ondansetron (ZOFRAN) 8 MG tablet Take 1 tablet (8 mg total) by mouth every 8 (eight) hours as needed for refractory nausea / vomiting. Start on day 3 after chemo. 30 tablet 1  . ONETOUCH DELICA LANCETS 28B MISC 3 (three) times daily. as directed  5  . ONETOUCH VERIO test strip 3 (three) times daily. as directed  5  . prochlorperazine (COMPAZINE) 10 MG tablet Take 1 tablet (10 mg total) by mouth every 6 (six) hours as needed (Nausea or vomiting). 30 tablet 1  . RELION PEN NEEDLES 32G X 4 MM MISC See admin instructions.  11  . senna-docusate (SENOKOT-S) 8.6-50 MG tablet Take 1 tablet by mouth at bedtime as needed for mild constipation or moderate constipation. 20 tablet 0  . simvastatin (ZOCOR) 10 MG tablet Take 10 mg by mouth at bedtime.     . Vitamin D, Ergocalciferol, 2000 units CAPS Take 2,000 Units by mouth daily.   0   No current facility-administered medications for this encounter.     Physical Findings: The patient is in no acute distress. Patient is alert and oriented.  height is 5' 6" (1.676 m) and weight is 184 lb  9.6 oz (83.7 kg). Her oral temperature is 97.6 F (36.4 C). Her blood pressure is 127/81 and her pulse is 110 (abnormal). Her respiration is 20 and oxygen saturation is 100%. .  No significant changes. Lungs are clear to auscultation bilaterally. Heart has regular rate and rhythm. No palpable cervical, supraclavicular, or axillary adenopathy. Abdomen soft, non-tender, normal bowel sounds. Pelvic exam deferred in light of recent completion of treatment.   Lab Findings: Lab Results  Component Value Date   WBC 5.1 04/30/2018   HGB 9.2 (L) 04/30/2018   HCT 27.9 (L) 04/30/2018   MCV  83.3 04/30/2018   PLT 237 04/30/2018    Radiographic Findings: Nm Pet Image Restag (ps) Skull Base To Thigh  Result Date: 04/26/2018 CLINICAL DATA:  Subsequent treatment strategy for endometrial cancer. EXAM: NUCLEAR MEDICINE PET SKULL BASE TO THIGH TECHNIQUE: 10.3 mCi F-18 FDG was injected intravenously. Full-ring PET imaging was performed from the skull base to thigh after the radiotracer. CT data was obtained and used for attenuation correction and anatomic localization. Fasting blood glucose: 173 mg/dl COMPARISON:  01/09/2018. FINDINGS: Mediastinal blood pool activity: SUV max 3.3 NECK: No hypermetabolic lymph nodes in the neck. Incidental CT findings: none CHEST: Similar appearance of the right axillary lymph node now measuring 17 mm short axis compared to 15 mm previously. SUV max = 8 on today's study, unchanged from prior. Index lymph node in the left axilla is 12 mm short axis today, stable since prior. Lymph node remains hypermetabolic with SUV max = 8 on today's exam compared to 11.6 previously. Incidental CT findings: Right Port-A-Cath tip is positioned in the distal SVC. 6 mm anterior right perifissural nodule (30/8) is stable in the interval. No new pulmonary nodule or mass. ABDOMEN/PELVIS: Interval resection of the large hypermetabolic uterine mass seen previously. No definite hypermetabolic peritoneal disease in the pelvis. There is a focus of FDG accumulation along the right wall of the distal rectum, nonspecific with no mass lesion evident at this location by CT. Similar appearance of lymph nodes in the groin regions. 10 mm short axis left groin node (167/4) is unchanged in size in the interval but demonstrates FDG accumulation today with SUV max = 3.6. 7 mm short axis left groin node on 162/4) is also stable in size with low level FDG uptake today ( SUV max = 3.2. Incidental CT findings: Stable appearance of small low-density liver lesions demonstrate no convincing hypermetabolism on PET  sequences. Stable right renal cyst. SKELETON: No focal hypermetabolic activity to suggest skeletal metastasis. Incidental CT findings: The mixed lucent and sclerotic lesion in the T12 vertebral body is stable and shows no hypermetabolism. IMPRESSION: 1. Interval resection of the hypermetabolic endometrial primary. No discernible hypermetabolic pelvic sidewall or peritoneal metastases. 2. Stable hypermetabolic bilateral axillary lymphadenopathy. The degree of hypermetabolism in these lymph nodes remains highly suspicious for neoplasm. 3. Interval development of low level FDG uptake in 2 stable, small lymph nodes in the left groin region. This may be reactive, but close attention recommended to exclude metastatic involvement. 4. Stable non hypermetabolic low-density liver lesions and the mixed lucent and sclerotic T12 lesion is stable without hypermetabolism today. Electronically Signed   By: Misty Stanley M.D.   On: 04/26/2018 20:11    Impression:  Clinically stable after HDR. She is doing much better after being placed on antibiotics for a urinary tract infection.  Plan: Patient was given vaginal dilator and instructions on its use. Follow-up with Dr. Denman George in 2 months. Follow-up in  radiation oncology in 5 months.  ____________________________________   Blair Promise, PhD, MD    This document serves as a record of services personally performed by Gery Pray, MD. It was created on his behalf by Mary-Margaret Loma Messing, a trained medical scribe. The creation of this record is based on the scribe's personal observations and the provider's statements to them. This document has been checked and approved by the attending provider.

## 2018-05-21 NOTE — Telephone Encounter (Signed)
Shirley from radiation called and scheduled a two month follow up with Dr. Denman George. Enid Derry will contact the patient

## 2018-05-22 ENCOUNTER — Inpatient Hospital Stay (HOSPITAL_BASED_OUTPATIENT_CLINIC_OR_DEPARTMENT_OTHER): Payer: Medicare Other | Admitting: Hematology and Oncology

## 2018-05-22 ENCOUNTER — Encounter: Payer: Self-pay | Admitting: Hematology and Oncology

## 2018-05-22 ENCOUNTER — Inpatient Hospital Stay: Payer: Medicare Other | Attending: Gynecology

## 2018-05-22 ENCOUNTER — Inpatient Hospital Stay: Payer: Medicare Other

## 2018-05-22 ENCOUNTER — Other Ambulatory Visit: Payer: Self-pay | Admitting: Hematology and Oncology

## 2018-05-22 VITALS — HR 94

## 2018-05-22 DIAGNOSIS — N183 Chronic kidney disease, stage 3 unspecified: Secondary | ICD-10-CM

## 2018-05-22 DIAGNOSIS — C7962 Secondary malignant neoplasm of left ovary: Secondary | ICD-10-CM

## 2018-05-22 DIAGNOSIS — Z794 Long term (current) use of insulin: Secondary | ICD-10-CM | POA: Insufficient documentation

## 2018-05-22 DIAGNOSIS — Z79899 Other long term (current) drug therapy: Secondary | ICD-10-CM | POA: Diagnosis not present

## 2018-05-22 DIAGNOSIS — D6489 Other specified anemias: Secondary | ICD-10-CM

## 2018-05-22 DIAGNOSIS — E1165 Type 2 diabetes mellitus with hyperglycemia: Secondary | ICD-10-CM

## 2018-05-22 DIAGNOSIS — C7982 Secondary malignant neoplasm of genital organs: Secondary | ICD-10-CM | POA: Diagnosis not present

## 2018-05-22 DIAGNOSIS — C541 Malignant neoplasm of endometrium: Secondary | ICD-10-CM

## 2018-05-22 DIAGNOSIS — Z923 Personal history of irradiation: Secondary | ICD-10-CM

## 2018-05-22 DIAGNOSIS — Z9221 Personal history of antineoplastic chemotherapy: Secondary | ICD-10-CM | POA: Insufficient documentation

## 2018-05-22 DIAGNOSIS — C773 Secondary and unspecified malignant neoplasm of axilla and upper limb lymph nodes: Secondary | ICD-10-CM

## 2018-05-22 DIAGNOSIS — D61818 Other pancytopenia: Secondary | ICD-10-CM

## 2018-05-22 DIAGNOSIS — Z5111 Encounter for antineoplastic chemotherapy: Secondary | ICD-10-CM | POA: Diagnosis not present

## 2018-05-22 LAB — CBC WITH DIFFERENTIAL (CANCER CENTER ONLY)
Abs Immature Granulocytes: 0.05 10*3/uL (ref 0.00–0.07)
Basophils Absolute: 0 10*3/uL (ref 0.0–0.1)
Basophils Relative: 0 %
Eosinophils Absolute: 0 10*3/uL (ref 0.0–0.5)
Eosinophils Relative: 0 %
HCT: 25.6 % — ABNORMAL LOW (ref 36.0–46.0)
Hemoglobin: 8.5 g/dL — ABNORMAL LOW (ref 12.0–15.0)
Immature Granulocytes: 1 %
Lymphocytes Relative: 8 %
Lymphs Abs: 0.8 10*3/uL (ref 0.7–4.0)
MCH: 28 pg (ref 26.0–34.0)
MCHC: 33.2 g/dL (ref 30.0–36.0)
MCV: 84.2 fL (ref 80.0–100.0)
Monocytes Absolute: 0.2 10*3/uL (ref 0.1–1.0)
Monocytes Relative: 2 %
Neutro Abs: 9.6 10*3/uL — ABNORMAL HIGH (ref 1.7–7.7)
Neutrophils Relative %: 89 %
Platelet Count: 129 10*3/uL — ABNORMAL LOW (ref 150–400)
RBC: 3.04 MIL/uL — ABNORMAL LOW (ref 3.87–5.11)
RDW: 16.7 % — ABNORMAL HIGH (ref 11.5–15.5)
WBC Count: 10.8 10*3/uL — ABNORMAL HIGH (ref 4.0–10.5)
nRBC: 0 % (ref 0.0–0.2)

## 2018-05-22 LAB — CMP (CANCER CENTER ONLY)
ALT: 17 U/L (ref 0–44)
AST: 15 U/L (ref 15–41)
Albumin: 3.6 g/dL (ref 3.5–5.0)
Alkaline Phosphatase: 91 U/L (ref 38–126)
Anion gap: 9 (ref 5–15)
BUN: 29 mg/dL — ABNORMAL HIGH (ref 8–23)
CO2: 22 mmol/L (ref 22–32)
Calcium: 10.4 mg/dL — ABNORMAL HIGH (ref 8.9–10.3)
Chloride: 105 mmol/L (ref 98–111)
Creatinine: 1.3 mg/dL — ABNORMAL HIGH (ref 0.44–1.00)
GFR, Est AFR Am: 49 mL/min — ABNORMAL LOW (ref >60)
GFR, Estimated: 42 mL/min — ABNORMAL LOW (ref >60)
Glucose, Bld: 178 mg/dL — ABNORMAL HIGH (ref 70–99)
Potassium: 4.1 mmol/L (ref 3.5–5.1)
Sodium: 136 mmol/L (ref 135–145)
Total Bilirubin: 0.6 mg/dL (ref 0.3–1.2)
Total Protein: 7.6 g/dL (ref 6.5–8.1)

## 2018-05-22 MED ORDER — DIPHENHYDRAMINE HCL 50 MG/ML IJ SOLN
INTRAMUSCULAR | Status: AC
  Filled 2018-05-22: qty 1

## 2018-05-22 MED ORDER — FAMOTIDINE IN NACL 20-0.9 MG/50ML-% IV SOLN
20.0000 mg | Freq: Once | INTRAVENOUS | Status: AC
  Administered 2018-05-22: 20 mg via INTRAVENOUS

## 2018-05-22 MED ORDER — SODIUM CHLORIDE 0.9 % IV SOLN
450.0000 mg | Freq: Once | INTRAVENOUS | Status: AC
  Administered 2018-05-22: 450 mg via INTRAVENOUS
  Filled 2018-05-22: qty 45

## 2018-05-22 MED ORDER — SODIUM CHLORIDE 0.9% FLUSH
10.0000 mL | Freq: Once | INTRAVENOUS | Status: AC
  Administered 2018-05-22: 10 mL
  Filled 2018-05-22: qty 10

## 2018-05-22 MED ORDER — HEPARIN SOD (PORK) LOCK FLUSH 100 UNIT/ML IV SOLN
500.0000 [IU] | Freq: Once | INTRAVENOUS | Status: AC | PRN
  Administered 2018-05-22: 500 [IU]
  Filled 2018-05-22: qty 5

## 2018-05-22 MED ORDER — PALONOSETRON HCL INJECTION 0.25 MG/5ML
0.2500 mg | Freq: Once | INTRAVENOUS | Status: AC
  Administered 2018-05-22: 0.25 mg via INTRAVENOUS

## 2018-05-22 MED ORDER — FAMOTIDINE IN NACL 20-0.9 MG/50ML-% IV SOLN
INTRAVENOUS | Status: AC
  Filled 2018-05-22: qty 50

## 2018-05-22 MED ORDER — SODIUM CHLORIDE 0.9 % IV SOLN
140.0000 mg/m2 | Freq: Once | INTRAVENOUS | Status: AC
  Administered 2018-05-22: 282 mg via INTRAVENOUS
  Filled 2018-05-22: qty 47

## 2018-05-22 MED ORDER — SODIUM CHLORIDE 0.9 % IV SOLN
Freq: Once | INTRAVENOUS | Status: AC
  Administered 2018-05-22: 13:00:00 via INTRAVENOUS
  Filled 2018-05-22: qty 250

## 2018-05-22 MED ORDER — SODIUM CHLORIDE 0.9% FLUSH
10.0000 mL | INTRAVENOUS | Status: DC | PRN
  Administered 2018-05-22: 10 mL
  Filled 2018-05-22: qty 10

## 2018-05-22 MED ORDER — DIPHENHYDRAMINE HCL 50 MG/ML IJ SOLN
50.0000 mg | Freq: Once | INTRAMUSCULAR | Status: AC
  Administered 2018-05-22: 50 mg via INTRAVENOUS

## 2018-05-22 MED ORDER — SODIUM CHLORIDE 0.9 % IV SOLN
Freq: Once | INTRAVENOUS | Status: AC
  Administered 2018-05-22: 13:00:00 via INTRAVENOUS
  Filled 2018-05-22: qty 5

## 2018-05-22 MED ORDER — PALONOSETRON HCL INJECTION 0.25 MG/5ML
INTRAVENOUS | Status: AC
  Filled 2018-05-22: qty 5

## 2018-05-22 NOTE — Assessment & Plan Note (Signed)
She has mild fluctuation of her renal function Risk factor modification and increase oral fluid as tolerated

## 2018-05-22 NOTE — Assessment & Plan Note (Signed)
She have stable disease on recent imaging She will continue her treatment before repeat PET scan again, due around April 2020

## 2018-05-22 NOTE — Assessment & Plan Note (Signed)
Her last imaging study showed stable disease I plan to proceed with treatment this month and next month and repeat imaging study again around April 2020

## 2018-05-22 NOTE — Progress Notes (Signed)
[place] OFFICE PROGRESS NOTE  Patient Care Team: Jonathon Jordan, MD as PCP - General (Family Medicine)  ASSESSMENT & PLAN:  Endometrial cancer [facility]) Her last imaging study showed stable disease I plan to proceed with treatment this month and next month and repeat imaging study again around April 2020  Pancytopenia, acquired [facility]) She has mild worsening pancytopenia likely due to fluctuation of her renal function I will keep carboplatin at previous dose around 450 mg  Chronic kidney disease (CKD), stage III (moderate) ([place]) She has mild fluctuation of her renal function Risk factor modification and increase oral fluid as tolerated   No orders of the defined types were placed in this encounter.   INTERVAL HISTORY: Please see below for problem oriented charting. She returns for further follow-up and chemotherapy Since her last time I saw her, she feels well Denies peripheral neuropathy No recent nausea, vomiting or changes in bowel habits Energy level is fair The patient denies any recent signs or symptoms of bleeding such as spontaneous epistaxis, hematuria or hematochezia. 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 Leo Grosser who obtained an endometrial biopsy showing poorly differentiated endometrial carcinoma and negative endocervical curettage    12/12/2017 Imaging    MAMMOGRAM FINDINGS: In the right axilla, a possible mass warrants further evaluation. In the left breast, no findings suspicious for malignancy.  Images were processed with CAD.  IMPRESSION: Further evaluation is suggested for possible mass in the right axilla.      12/24/2017 Imaging    Ct scan chest, abdomen and pelvis 1. Marked thickening of the endometrium (42 mm) compatible with known primary endometrial malignancy. No evidence of extrauterine invasion. 2. No pelvic or retroperitoneal adenopathy. 3. Right middle lobe 4 mm solid pulmonary nodule, for which follow-up chest CT is advised in 3-6 months. 4. Several findings that are equivocal for distant metastatic disease. Vaguely nodular heterogeneous hyperenhancement in the peripheral right liver lobe, which could represent benign transient perfusional phenomena, with underlying liver lesions not entirely excluded. Mildly sclerotic T12 vertebral lesion. Mildly enlarged right axillary lymph node. The best single test to further evaluate these findings would be a PET-CT. Alternative tests include bone scan or thoracic MRI without and with IV contrast for the T12 osseous lesion, MRI abdomen without and with IV contrast for the liver findings, and diagnostic mammographic evaluation for the right axillary node. 5.  Aortic Atherosclerosis (ICD10-I70.0).     01/09/2018 PET scan    1. Moderate hypermetabolism corresponding to enlarging axillary node since 12/22/2017. Highly suspicious for an atypical distribution of metastatic disease. 2. No hypermetabolism to suggest hepatic or T12 osseous metastasis. 3. Hypermetabolic endometrial primary.    01/23/2018 Initial Diagnosis    Endometrial cancer ([place])    01/23/2018 Pathology Results    1. Lymph node, sentinel, biopsy, left external iliac - NO CARCINOMA IDENTIFIED IN ONE LYMPH NODE (0/1) - SEE COMMENT 2. Lymph nodes, regional resection, right para aortic - NO CARCINOMA IDENTIFIED IN FOUR LYMPH NODES (0/4) - SEE COMMENT 3. Lymph nodes, regional resection, right pelvic - NO CARCINOMA IDENTIFIED IN EIGHT LYMPH NODES (0/8) - SEE COMMENT 4. Cul-de-sac biopsy - METASTATIC CARCINOMA 5.  Uterus +/- tubes/ovaries, neoplastic, cervix, bilateral fallopian tubes and  ovaries UTERUS: - MIXED SEROUS AND ENDOMETRIOID CARCINOMA - SEROSAL IMPLANTS PRESENT - LYMPHOVASCULAR SPACE INVASION PRESENT - LEIOMYOMATA (1.5 CM; LARGEST) - SEE ONCOLOGY TABLE AND COMMENT BELOW CERVIX: - BENIGN NABOTHIAN CYSTS - NO CARCINOMA IDENTIFIED BILATERAL OVARIES: - METASTATIC CARCINOMA PRESENT ON OVARIAN SURFACE BILATERAL FALLOPIAN TUBES: - INTRALUMINAL CARCINOMA Microscopic Comment 1. -3. Cytokeratin AE1/3 was performed on the sentinel lymph nodes to exclude micrometastasis. There is no evidence of metastatic carcinoma by immunohistochemistry. 5. 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%) Histologic Grade: N/A Myometrial invasion: Estimated less than 50% myometrial invasion (0.3 cm of myometrium involved; 1.4 cm measured thickness) Uterine Serosa Involvement: Present Cervical stromal involvement: Not identified Extent of involvement of other organs: - Fallopian tube (left within the lumen) - Ovary, left (surface involvement) - Cul-de-sac Lymphovascular invasion: Present Regional Lymph Nodes: Examined: 1 Sentinel 12 Non-sentinel 13 Total Lymph nodes with metastasis: 0 Isolated tumor cells (< 0.2 mm): 0 Micrometastasis: (> 0.2 mm and < 2.0 mm): 0 Macrometastasis: (> 2.0 mm): 0 Extracapsular extension: N/A Tumor block for ancillary studies: 5E, 5B MMR / MSI testing: Pending will be reported separately Pathologic Stage Classification (pTNM, AJCC 8th edition): pT3a, pN0 FIGO Stage: IIIA COMMENT: There is tumor present on the surface of the left ovary and within the lumen of the left fallopian tube. The carcinoma appears to be mixed with the largest component being high grade serous carcinoma. Dr. Lyndon Code reviewed the case and agrees with the  above diagnosis.    01/23/2018 Surgery    Surgeon: Donaciano Eva   Operation: Robotic-assisted laparoscopic total hysterectomy with bilateral salpingoophorectomy, SLN injection, mapping and biopsy, right pelvic and para-aortic lymphadenectomy  Operative Findings:  : 10-12cm bulky uterus with frank serosal involvement on posterior cul de sac peritoneum and anterior peritoneum which adhesed the bladder to the anterior uterus. Unilateral mapping on left pelvis. No grossly suspicious nodes. Normal omentum and diaphragms.     02/01/2018 Pathology Results    Lymph node, needle/core biopsy, right axilla - METASTATIC CARCINOMA - SEE COMMENT Microscopic Comment The neoplastic cells are positive for cytokeratin 7 and Pax-8 but negative for cytokeratin 5/6, cytokeratin 20, Gata-3, p63, p53 and GCDFP. Overall, the immunoprofile is consistent with metastasis from the patient's known gynecologic carcinoma.    02/01/2018 Procedure    Ultrasound-guided core biopsies of a suspicious right axillary lymph node.    02/21/2018 -  Chemotherapy    The patient had carboplatin & Taxol    04/26/2018 PET scan    1. Interval resection of the hypermetabolic endometrial primary. No discernible hypermetabolic pelvic sidewall or peritoneal metastases. 2. Stable hypermetabolic bilateral axillary lymphadenopathy. The degree of hypermetabolism in these lymph nodes remains highly suspicious for neoplasm. 3. Interval development of low level FDG uptake in 2 stable, small lymph nodes in the left groin region. This may be reactive, but close attention recommended to exclude metastatic involvement. 4. Stable non hypermetabolic low-density liver lesions and the mixed lucent and sclerotic T12 lesion is stable without hypermetabolism today.     REVIEW OF SYSTEMS:   Constitutional: Denies fevers, chills or abnormal weight loss Eyes: Denies blurriness of vision Ears, nose, mouth, throat, and face: Denies mucositis or  sore throat Respiratory: Denies cough, dyspnea or wheezes Cardiovascular: Denies palpitation, chest discomfort or lower extremity swelling  Gastrointestinal:  Denies nausea, heartburn or change in bowel habits Skin: Denies abnormal skin rashes Lymphatics: Denies new lymphadenopathy or easy bruising Neurological:Denies numbness, tingling or new weaknesses Behavioral/Psych: Mood is stable, no new changes  All other systems were reviewed with the patient and are negative.  I have reviewed the past medical history, past surgical history, social history and family history with the patient and they are unchanged from previous note.  ALLERGIES:  is allergic to sulfa antibiotics and sulfamethoxazole.  MEDICATIONS:  Current Outpatient Medications  Medication Sig Dispense Refill  . blood glucose meter kit and supplies KIT Dispense based on patient and insurance preference. Use up to four times daily as directed. (FOR ICD-9 250.00, 250.01). 1 each 0  . dexamethasone (DECADRON) 4 MG tablet Take 2 tabs at the night before and 2 tabs the morning of chemotherapy, every 3 weeks, by mouth 24 tablet 0  . Insulin Isophane & Regular Human (NOVOLIN 70/30 FLEXPEN) (70-30) 100 UNIT/ML PEN Inject 25 Units into the skin daily with breakfast. Take 35 units on chemo days.  also pen needles 1/day 15 mL 11  . ketoconazole (NIZORAL) 2 % cream Apply to web space twice a day for 1 week then daily for 1 month (Patient taking differently: Apply 1 application topically daily as needed for irritation. Apply to web space twice a day for 1 week then daily for 1 month) 15 g 0  . lidocaine-prilocaine (EMLA) cream Apply to affected area once 30 g 3  . lisinopril-hydrochlorothiazide (PRINZIDE,ZESTORETIC) 20-12.5 MG tablet Take 1 tablet by mouth daily. (Patient taking differently: Take 1 tablet by mouth every morning. ) 30 tablet 11  . nitrofurantoin, macrocrystal-monohydrate, (MACROBID) 100 MG capsule Take 1 capsule (100 mg total) by  mouth 2 (two) times daily. 14 capsule 0  . ondansetron (ZOFRAN) 8 MG tablet Take 1 tablet (8 mg total) by mouth every 8 (eight) hours as needed for refractory nausea / vomiting. Start on day 3 after chemo. 30 tablet 1  . ONETOUCH DELICA LANCETS 16R MISC 3 (three) times daily. as directed  5  . ONETOUCH VERIO test strip 3 (three) times daily. as directed  5  . prochlorperazine (COMPAZINE) 10 MG tablet Take 1 tablet (10 mg total) by mouth every 6 (six) hours as needed (Nausea or vomiting). 30 tablet 1  . RELION PEN NEEDLES 32G X 4 MM MISC See admin instructions.  11  . senna-docusate (SENOKOT-S) 8.6-50 MG tablet Take 1 tablet by mouth at bedtime as needed for mild constipation or moderate constipation. 20 tablet 0  . simvastatin (ZOCOR) 10 MG tablet Take 10 mg by mouth at bedtime.     . Vitamin D, Ergocalciferol, 2000 units CAPS Take 2,000 Units by mouth daily.   0   No current facility-administered medications for this visit.     PHYSICAL EXAMINATION: ECOG PERFORMANCE STATUS: 1 - Symptomatic but completely ambulatory  Vitals:   05/22/18 1140  BP: 120/76  Pulse: (!) 101  Resp: 17  Temp: 97.9 F (36.6 C)  SpO2: 100%   Filed Weights   05/22/18 1140  Weight: 189 lb 9.6 oz (86 kg)    GENERAL:alert, no distress and comfortable SKIN: skin color, texture, turgor are normal, no rashes or significant lesions EYES: normal, Conjunctiva are pink and non-injected, sclera clear OROPHARYNX:no exudate, no erythema and lips, buccal mucosa, and tongue normal  NECK: supple, thyroid normal size, non-tender, without nodularity LYMPH:  no palpable lymphadenopathy in the cervical, axillary or inguinal LUNGS: clear to  auscultation and percussion with normal breathing effort HEART: regular rate & rhythm and no murmurs and no lower extremity edema ABDOMEN:abdomen soft, non-tender and normal bowel sounds Musculoskeletal:no cyanosis of digits and no clubbing  NEURO: alert & oriented x 3 with fluent speech,  no focal motor/sensory deficits  LABORATORY DATA:  I have reviewed the data as listed    Component Value Date/Time   NA 136 05/22/2018 1100   K 4.1 05/22/2018 1100   CL 105 05/22/2018 1100   CO2 22 05/22/2018 1100   GLUCOSE 178 (H) 05/22/2018 1100   BUN 29 (H) 05/22/2018 1100   CREATININE 1.30 (H) 05/22/2018 1100   CALCIUM 10.4 (H) 05/22/2018 1100   PROT 7.6 05/22/2018 1100   ALBUMIN 3.6 05/22/2018 1100   AST 15 05/22/2018 1100   ALT 17 05/22/2018 1100   ALKPHOS 91 05/22/2018 1100   BILITOT 0.6 05/22/2018 1100   GFRNONAA 42 (L) 05/22/2018 1100   GFRAA 49 (L) 05/22/2018 1100    No results found for: SPEP, UPEP  Lab Results  Component Value Date   WBC 10.8 (H) 05/22/2018   NEUTROABS 9.6 (H) 05/22/2018   HGB 8.5 (L) 05/22/2018   HCT 25.6 (L) 05/22/2018   MCV 84.2 05/22/2018   PLT 129 (L) 05/22/2018      Chemistry      Component Value Date/Time   NA 136 05/22/2018 1100   K 4.1 05/22/2018 1100   CL 105 05/22/2018 1100   CO2 22 05/22/2018 1100   BUN 29 (H) 05/22/2018 1100   CREATININE 1.30 (H) 05/22/2018 1100      Component Value Date/Time   CALCIUM 10.4 (H) 05/22/2018 1100   ALKPHOS 91 05/22/2018 1100   AST 15 05/22/2018 1100   ALT 17 05/22/2018 1100   BILITOT 0.6 05/22/2018 1100       RADIOGRAPHIC STUDIES: I have personally reviewed the radiological images as listed and agreed with the findings in the report. Nm Pet Image Restag (ps) Skull Base To Thigh  Result Date: 04/26/2018 CLINICAL DATA:  Subsequent treatment strategy for endometrial cancer. EXAM: NUCLEAR MEDICINE PET SKULL BASE TO THIGH TECHNIQUE: 10.3 mCi F-18 FDG was injected intravenously. Full-ring PET imaging was performed from the skull base to thigh after the radiotracer. CT data was obtained and used for attenuation correction and anatomic localization. Fasting blood glucose: 173 mg/dl COMPARISON:  01/09/2018. FINDINGS: Mediastinal blood pool activity: SUV max 3.3 NECK: No hypermetabolic lymph  nodes in the neck. Incidental CT findings: none CHEST: Similar appearance of the right axillary lymph node now measuring 17 mm short axis compared to 15 mm previously. SUV max = 8 on today's study, unchanged from prior. Index lymph node in the left axilla is 12 mm short axis today, stable since prior. Lymph node remains hypermetabolic with SUV max = 8 on today's exam compared to 11.6 previously. Incidental CT findings: Right Port-A-Cath tip is positioned in the distal SVC. 6 mm anterior right perifissural nodule (30/8) is stable in the interval. No new pulmonary nodule or mass. ABDOMEN/PELVIS: Interval resection of the large hypermetabolic uterine mass seen previously. No definite hypermetabolic peritoneal disease in the pelvis. There is a focus of FDG accumulation along the right wall of the distal rectum, nonspecific with no mass lesion evident at this location by CT. Similar appearance of lymph nodes in the groin regions. 10 mm short axis left groin node (167/4) is unchanged in size in the interval but demonstrates FDG accumulation today with SUV max = 3.6. 7  mm short axis left groin node on 162/4) is also stable in size with low level FDG uptake today ( SUV max = 3.2. Incidental CT findings: Stable appearance of small low-density liver lesions demonstrate no convincing hypermetabolism on PET sequences. Stable right renal cyst. SKELETON: No focal hypermetabolic activity to suggest skeletal metastasis. Incidental CT findings: The mixed lucent and sclerotic lesion in the T12 vertebral body is stable and shows no hypermetabolism. IMPRESSION: 1. Interval resection of the hypermetabolic endometrial primary. No discernible hypermetabolic pelvic sidewall or peritoneal metastases. 2. Stable hypermetabolic bilateral axillary lymphadenopathy. The degree of hypermetabolism in these lymph nodes remains highly suspicious for neoplasm. 3. Interval development of low level FDG uptake in 2 stable, small lymph nodes in the left  groin region. This may be reactive, but close attention recommended to exclude metastatic involvement. 4. Stable non hypermetabolic low-density liver lesions and the mixed lucent and sclerotic T12 lesion is stable without hypermetabolism today. Electronically Signed   By: Misty Stanley M.D.   On: 04/26/2018 20:11    All questions were answered. The patient knows to call the clinic with any problems, questions or concerns. No barriers to learning was detected.  I spent 15 minutes counseling the patient face to face. The total time spent in the appointment was 20 minutes and more than 50% was on counseling and review of test results  [name], MD 05/22/2018 12:29 PM

## 2018-05-22 NOTE — Assessment & Plan Note (Signed)
She has mild, progressive pancytopenia likely due to side effects of treatment I noted fluctuation of her renal function I will keep her carboplatin dose around 450 mg  Currently, she is not symptomatic

## 2018-05-22 NOTE — Patient Instructions (Signed)
   [facility] Discharge Instructions for Patients Receiving Chemotherapy  Today you received the following chemotherapy agents Taxol and Carboplatin   To help prevent nausea and vomiting after your treatment, we encourage you to take your nausea medication as directed.    If you develop nausea and vomiting that is not controlled by your nausea medication, call the clinic.   BELOW ARE SYMPTOMS THAT SHOULD BE REPORTED IMMEDIATELY:  *FEVER GREATER THAN 100.5 F  *CHILLS WITH OR WITHOUT FEVER  NAUSEA AND VOMITING THAT IS NOT CONTROLLED WITH YOUR NAUSEA MEDICATION  *UNUSUAL SHORTNESS OF BREATH  *UNUSUAL BRUISING OR BLEEDING  TENDERNESS IN MOUTH AND THROAT WITH OR WITHOUT PRESENCE OF ULCERS  *URINARY PROBLEMS  *BOWEL PROBLEMS  UNUSUAL RASH Items with * indicate a potential emergency and should be followed up as soon as possible.  Feel free to call the clinic should you have any questions or concerns. The clinic phone number is [phone].  Please show the CHEMO ALERT CARD at check-in to the Emergency Department and triage nurse.   

## 2018-05-22 NOTE — Assessment & Plan Note (Signed)
She has mild worsening pancytopenia likely due to fluctuation of her renal function I will keep carboplatin at previous dose around 450 mg

## 2018-05-22 NOTE — Assessment & Plan Note (Signed)
She has intermittent changes of renal function I recommend increase fluid intake as tolerated

## 2018-06-13 ENCOUNTER — Inpatient Hospital Stay (HOSPITAL_BASED_OUTPATIENT_CLINIC_OR_DEPARTMENT_OTHER): Payer: Medicare Other | Admitting: Hematology and Oncology

## 2018-06-13 ENCOUNTER — Other Ambulatory Visit: Payer: Self-pay | Admitting: Hematology and Oncology

## 2018-06-13 ENCOUNTER — Encounter: Payer: Self-pay | Admitting: Hematology and Oncology

## 2018-06-13 ENCOUNTER — Inpatient Hospital Stay: Payer: Medicare Other

## 2018-06-13 VITALS — BP 134/73 | HR 117 | Temp 97.8°F | Resp 18 | Ht 66.0 in | Wt 184.2 lb

## 2018-06-13 DIAGNOSIS — D638 Anemia in other chronic diseases classified elsewhere: Secondary | ICD-10-CM

## 2018-06-13 DIAGNOSIS — Z923 Personal history of irradiation: Secondary | ICD-10-CM | POA: Diagnosis not present

## 2018-06-13 DIAGNOSIS — Z794 Long term (current) use of insulin: Secondary | ICD-10-CM | POA: Diagnosis not present

## 2018-06-13 DIAGNOSIS — Z79899 Other long term (current) drug therapy: Secondary | ICD-10-CM | POA: Diagnosis not present

## 2018-06-13 DIAGNOSIS — C7982 Secondary malignant neoplasm of genital organs: Secondary | ICD-10-CM | POA: Diagnosis not present

## 2018-06-13 DIAGNOSIS — D61818 Other pancytopenia: Secondary | ICD-10-CM | POA: Diagnosis not present

## 2018-06-13 DIAGNOSIS — C541 Malignant neoplasm of endometrium: Secondary | ICD-10-CM

## 2018-06-13 DIAGNOSIS — D6489 Other specified anemias: Secondary | ICD-10-CM

## 2018-06-13 DIAGNOSIS — Z9221 Personal history of antineoplastic chemotherapy: Secondary | ICD-10-CM

## 2018-06-13 DIAGNOSIS — C7962 Secondary malignant neoplasm of left ovary: Secondary | ICD-10-CM

## 2018-06-13 DIAGNOSIS — Z5111 Encounter for antineoplastic chemotherapy: Secondary | ICD-10-CM | POA: Diagnosis not present

## 2018-06-13 DIAGNOSIS — C773 Secondary and unspecified malignant neoplasm of axilla and upper limb lymph nodes: Secondary | ICD-10-CM

## 2018-06-13 DIAGNOSIS — IMO0001 Reserved for inherently not codable concepts without codable children: Secondary | ICD-10-CM

## 2018-06-13 DIAGNOSIS — N183 Chronic kidney disease, stage 3 unspecified: Secondary | ICD-10-CM

## 2018-06-13 DIAGNOSIS — E1165 Type 2 diabetes mellitus with hyperglycemia: Secondary | ICD-10-CM | POA: Diagnosis not present

## 2018-06-13 LAB — CMP (CANCER CENTER ONLY)
ALT: 10 U/L (ref 0–44)
AST: 13 U/L — ABNORMAL LOW (ref 15–41)
Albumin: 3.5 g/dL (ref 3.5–5.0)
Alkaline Phosphatase: 89 U/L (ref 38–126)
Anion gap: 12 (ref 5–15)
BUN: 35 mg/dL — ABNORMAL HIGH (ref 8–23)
CO2: 19 mmol/L — ABNORMAL LOW (ref 22–32)
Calcium: 10.2 mg/dL (ref 8.9–10.3)
Chloride: 105 mmol/L (ref 98–111)
Creatinine: 1.62 mg/dL — ABNORMAL HIGH (ref 0.44–1.00)
GFR, Est AFR Am: 38 mL/min — ABNORMAL LOW (ref >60)
GFR, Estimated: 33 mL/min — ABNORMAL LOW (ref >60)
Glucose, Bld: 342 mg/dL — ABNORMAL HIGH (ref 70–99)
Potassium: 4.3 mmol/L (ref 3.5–5.1)
Sodium: 136 mmol/L (ref 135–145)
Total Bilirubin: 0.4 mg/dL (ref 0.3–1.2)
Total Protein: 7.8 g/dL (ref 6.5–8.1)

## 2018-06-13 LAB — CBC WITH DIFFERENTIAL (CANCER CENTER ONLY)
Abs Immature Granulocytes: 0.04 10*3/uL (ref 0.00–0.07)
Basophils Absolute: 0 10*3/uL (ref 0.0–0.1)
Basophils Relative: 0 %
Eosinophils Absolute: 0 10*3/uL (ref 0.0–0.5)
Eosinophils Relative: 0 %
HCT: 22.6 % — ABNORMAL LOW (ref 36.0–46.0)
Hemoglobin: 7.3 g/dL — ABNORMAL LOW (ref 12.0–15.0)
Immature Granulocytes: 1 %
Lymphocytes Relative: 16 %
Lymphs Abs: 0.7 10*3/uL (ref 0.7–4.0)
MCH: 29.2 pg (ref 26.0–34.0)
MCHC: 32.3 g/dL (ref 30.0–36.0)
MCV: 90.4 fL (ref 80.0–100.0)
Monocytes Absolute: 0.1 10*3/uL (ref 0.1–1.0)
Monocytes Relative: 2 %
Neutro Abs: 3.3 10*3/uL (ref 1.7–7.7)
Neutrophils Relative %: 81 %
Platelet Count: 196 10*3/uL (ref 150–400)
RBC: 2.5 MIL/uL — ABNORMAL LOW (ref 3.87–5.11)
RDW: 17.1 % — ABNORMAL HIGH (ref 11.5–15.5)
WBC Count: 4 10*3/uL (ref 4.0–10.5)
nRBC: 0 % (ref 0.0–0.2)

## 2018-06-13 LAB — ABO/RH: ABO/RH(D): AB POS

## 2018-06-13 LAB — PREPARE RBC (CROSSMATCH)

## 2018-06-13 MED ORDER — FAMOTIDINE IN NACL 20-0.9 MG/50ML-% IV SOLN: 20.0000 mg | Freq: Once | INTRAVENOUS | Status: DC

## 2018-06-13 MED ORDER — SODIUM CHLORIDE 0.9 % IV SOLN
105.0000 mg/m2 | Freq: Once | INTRAVENOUS | Status: AC
  Administered 2018-06-13: 210 mg via INTRAVENOUS
  Filled 2018-06-13: qty 35

## 2018-06-13 MED ORDER — SODIUM CHLORIDE 0.9 % IV SOLN
Freq: Once | INTRAVENOUS | Status: AC
  Administered 2018-06-13: 11:00:00 via INTRAVENOUS
  Filled 2018-06-13: qty 5

## 2018-06-13 MED ORDER — SODIUM CHLORIDE 0.9 % IV SOLN
Freq: Once | INTRAVENOUS | Status: AC
  Administered 2018-06-13: 10:00:00 via INTRAVENOUS
  Filled 2018-06-13: qty 250

## 2018-06-13 MED ORDER — PALONOSETRON HCL INJECTION 0.25 MG/5ML
INTRAVENOUS | Status: AC
  Filled 2018-06-13: qty 5

## 2018-06-13 MED ORDER — SODIUM CHLORIDE 0.9% FLUSH
10.0000 mL | Freq: Once | INTRAVENOUS | Status: AC
  Administered 2018-06-13: 10 mL
  Filled 2018-06-13: qty 10

## 2018-06-13 MED ORDER — DIPHENHYDRAMINE HCL 50 MG/ML IJ SOLN
INTRAMUSCULAR | Status: AC
  Filled 2018-06-13: qty 1

## 2018-06-13 MED ORDER — DIPHENHYDRAMINE HCL 50 MG/ML IJ SOLN
50.0000 mg | Freq: Once | INTRAMUSCULAR | Status: AC
  Administered 2018-06-13: 50 mg via INTRAVENOUS

## 2018-06-13 MED ORDER — SODIUM CHLORIDE 0.9 % IV SOLN
400.0000 mg | Freq: Once | INTRAVENOUS | Status: AC
  Administered 2018-06-13: 400 mg via INTRAVENOUS
  Filled 2018-06-13: qty 40

## 2018-06-13 MED ORDER — SODIUM CHLORIDE 0.9 % IV SOLN
20.0000 mg | Freq: Once | INTRAVENOUS | Status: AC
  Administered 2018-06-13: 20 mg via INTRAVENOUS
  Filled 2018-06-13: qty 2

## 2018-06-13 MED ORDER — INSULIN REGULAR HUMAN 100 UNIT/ML IJ SOLN
10.0000 [IU] | Freq: Once | INTRAMUSCULAR | Status: AC
  Administered 2018-06-13: 10 [IU] via SUBCUTANEOUS
  Filled 2018-06-13: qty 10

## 2018-06-13 MED ORDER — HEPARIN SOD (PORK) LOCK FLUSH 100 UNIT/ML IV SOLN
500.0000 [IU] | Freq: Once | INTRAVENOUS | Status: AC | PRN
  Administered 2018-06-13: 500 [IU]
  Filled 2018-06-13: qty 5

## 2018-06-13 MED ORDER — SODIUM CHLORIDE 0.9% FLUSH
10.0000 mL | INTRAVENOUS | Status: DC | PRN
  Administered 2018-06-13: 10 mL
  Filled 2018-06-13: qty 10

## 2018-06-13 MED ORDER — PALONOSETRON HCL INJECTION 0.25 MG/5ML
0.2500 mg | Freq: Once | INTRAVENOUS | Status: AC
  Administered 2018-06-13: 0.25 mg via INTRAVENOUS

## 2018-06-13 NOTE — Progress Notes (Signed)
[facility] OFFICE PROGRESS NOTE  Patient Care Team: Jonathon Jordan, MD as PCP - General (Family Medicine)  ASSESSMENT & PLAN:  Endometrial cancer [facility]) She tolerated chemotherapy poorly with severe pancytopenia and mild acute renal failure We will proceed with treatment today with dose adjustment I plan to repeat imaging study in a month after today's dose for objective assessment of response of therapy  Pancytopenia, acquired ([place]) This is due to side effects of chemo I plan to prescribe further dose reduction of treatment We discussed some of the risks, benefits, and alternatives of blood transfusions. The patient is symptomatic from anemia and the hemoglobin level is critically low.  Some of the side-effects to be expected including risks of transfusion reactions, chills, infection, syndrome of volume overload and risk of hospitalization from various reasons and the patient is willing to proceed and went ahead to sign consent today. After today's chemotherapy, she will receive blood transfusion as well  Diabetes mellitus, insulin dependent (IDDM), uncontrolled ([place]) She has severe hyperglycemia due to side effects of steroid I will prescribe insulin therapy  Chronic kidney disease (CKD), stage III (moderate) (H[facility]) She has mild acute on chronic renal failure likely secondary to mild dehydration from hyperglycemia and anemia Will transfuse and prescribe dose reduction as above   Orders Placed This Encounter  Procedures  . CT CHEST W CONTRAST    Standing Status:   Future    Standing Expiration Date:   06/14/2019    Order Specific Question:   If indicated for the ordered procedure, I authorize the administration of contrast media per Radiology protocol    Answer:   Yes    Order Specific Question:   Preferred imaging location?    Answer:   [facility]    Order Specific Question:   Radiology Contrast Protocol - do NOT remove file path    Answer:    \\[email or web address]  . CT ABDOMEN PELVIS W CONTRAST    Standing Status:   Future    Standing Expiration Date:   06/14/2019    Order Specific Question:   If indicated for the ordered procedure, I authorize the administration of contrast media per Radiology protocol    Answer:   Yes    Order Specific Question:   Preferred imaging location?    Answer:   [facility]    Order Specific Question:   Radiology Contrast Protocol - do NOT remove file path    Answer:   \\[email or web address]  . Sample to Blood Bank    Standing Status:   Standing    Number of Occurrences:   33    Standing Expiration Date:   06/14/2019  . Type and screen    Standing Status:   Future    Number of Occurrences:   1    Standing Expiration Date:   06/14/2019  . Prepare RBC    Standing Status:   Standing    Number of Occurrences:   1    Order Specific Question:   # of Units    Answer:   2 units    Order Specific Question:   Transfusion Indications    Answer:   Symptomatic Anemia    Order Specific Question:   Special Requirements    Answer:   Irradiated    Order Specific Question:   If emergent release call blood bank    Answer:   Not emergent release    INTERVAL HISTORY: Please see below for problem oriented charting.  She returns for cycle 6 of chemotherapy She denies excessive fatigue, shortness of breath or dizziness No peripheral neuropathy No recent nausea or vomiting Her appetite is fair The patient denies any recent signs or symptoms of bleeding such as spontaneous epistaxis, hematuria or hematochezia.   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 Leo Grosser who obtained an  endometrial biopsy showing poorly differentiated endometrial carcinoma and negative endocervical curettage    12/12/2017 Imaging    MAMMOGRAM FINDINGS: In the right axilla, a possible mass warrants further evaluation. In the left breast, no findings suspicious for malignancy.  Images were processed with CAD.  IMPRESSION: Further evaluation is suggested for possible mass in the right axilla.     12/24/2017 Imaging    Ct scan chest, abdomen and pelvis 1. Marked thickening of the endometrium (42 mm) compatible with known primary endometrial malignancy. No evidence of extrauterine invasion. 2. No pelvic or retroperitoneal adenopathy. 3. Right middle lobe 4 mm solid pulmonary nodule, for which follow-up chest CT is advised in 3-6 months. 4. Several findings that are equivocal for distant metastatic disease. Vaguely nodular heterogeneous hyperenhancement in the peripheral right liver lobe, which could represent benign transient perfusional phenomena, with underlying liver lesions not entirely excluded. Mildly sclerotic T12 vertebral lesion. Mildly enlarged right axillary lymph node. The best single test to further evaluate these findings would be a PET-CT. Alternative tests include bone scan or thoracic MRI without and with IV contrast for the T12 osseous lesion, MRI abdomen without and with IV contrast for the liver findings, and diagnostic mammographic evaluation for the right axillary node. 5.  Aortic Atherosclerosis (ICD10-I70.0).     01/09/2018 PET scan    1. Moderate hypermetabolism corresponding to enlarging axillary node since 12/22/2017. Highly suspicious for an atypical distribution of metastatic disease. 2. No hypermetabolism to suggest hepatic or T12 osseous metastasis. 3. Hypermetabolic endometrial primary.    01/23/2018 Initial Diagnosis    Endometrial cancer ([place])    01/23/2018 Pathology Results    1. Lymph node, sentinel, biopsy, left external iliac - NO CARCINOMA IDENTIFIED IN  ONE LYMPH NODE (0/1) - SEE COMMENT 2. Lymph nodes, regional resection, right para aortic - NO CARCINOMA IDENTIFIED IN FOUR LYMPH NODES (0/4) - SEE COMMENT 3. Lymph nodes, regional resection, right pelvic - NO CARCINOMA IDENTIFIED IN EIGHT LYMPH NODES (0/8) - SEE COMMENT 4. Cul-de-sac biopsy - METASTATIC CARCINOMA 5. Uterus +/- tubes/ovaries, neoplastic, cervix, bilateral fallopian tubes and ovaries UTERUS: - MIXED SEROUS AND ENDOMETRIOID CARCINOMA - SEROSAL IMPLANTS PRESENT - LYMPHOVASCULAR SPACE INVASION PRESENT - LEIOMYOMATA (1.5 CM; LARGEST) - SEE ONCOLOGY TABLE AND COMMENT BELOW CERVIX: - BENIGN NABOTHIAN CYSTS - NO CARCINOMA IDENTIFIED BILATERAL OVARIES: - METASTATIC CARCINOMA PRESENT ON OVARIAN SURFACE BILATERAL FALLOPIAN TUBES: - INTRALUMINAL CARCINOMA Microscopic Comment 1. -3. Cytokeratin AE1/3 was performed on the sentinel lymph nodes to exclude micrometastasis. There is no evidence of metastatic carcinoma by immunohistochemistry. 5. 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%) Histologic Grade: N/A Myometrial invasion: Estimated less than 50% myometrial invasion (0.3 cm of myometrium involved; 1.4 cm measured thickness) Uterine Serosa  Involvement: Present Cervical stromal involvement: Not identified Extent of involvement of other organs: - Fallopian tube (left within the lumen) - Ovary, left (surface involvement) - Cul-de-sac Lymphovascular invasion: Present Regional Lymph Nodes: Examined: 1 Sentinel 12 Non-sentinel 13 Total Lymph nodes with metastasis: 0 Isolated tumor cells (< 0.2 mm): 0 Micrometastasis: (> 0.2 mm and < 2.0 mm): 0 Macrometastasis: (> 2.0 mm): 0 Extracapsular extension: N/A Tumor block for ancillary studies: 5E,  5B MMR / MSI testing: Pending will be reported separately Pathologic Stage Classification (pTNM, AJCC 8th edition): pT3a, pN0 FIGO Stage: IIIA COMMENT: There is tumor present on the surface of the left ovary and within the lumen of the left fallopian tube. The carcinoma appears to be mixed with the largest component being high grade serous carcinoma. Dr. Lyndon Code reviewed the case and agrees with the above diagnosis.    01/23/2018 Surgery    Surgeon: Donaciano Eva   Operation: Robotic-assisted laparoscopic total hysterectomy with bilateral salpingoophorectomy, SLN injection, mapping and biopsy, right pelvic and para-aortic lymphadenectomy  Operative Findings:  : 10-12cm bulky uterus with frank serosal involvement on posterior cul de sac peritoneum and anterior peritoneum which adhesed the bladder to the anterior uterus. Unilateral mapping on left pelvis. No grossly suspicious nodes. Normal omentum and diaphragms.     02/01/2018 Pathology Results    Lymph node, needle/core biopsy, right axilla - METASTATIC CARCINOMA - SEE COMMENT Microscopic Comment The neoplastic cells are positive for cytokeratin 7 and Pax-8 but negative for cytokeratin 5/6, cytokeratin 20, Gata-3, p63, p53 and GCDFP. Overall, the immunoprofile is consistent with metastasis from the patient's known gynecologic carcinoma.    02/01/2018 Procedure    Ultrasound-guided core biopsies of a suspicious right axillary lymph node.    02/21/2018 -  Chemotherapy    The patient had carboplatin & Taxol    03/26/2018 - 04/30/2018 Radiation Therapy    Radiation treatment dates:   03/26/18, 04/02/18, 04/19/18, 04/23/18 04/30/18  Site/dose: proximal Vagina, 6 Gy in 5 fractions for a total dose of 30 Gy     04/26/2018 PET scan    1. Interval resection of the hypermetabolic endometrial primary. No discernible hypermetabolic pelvic sidewall or peritoneal metastases. 2. Stable hypermetabolic bilateral axillary lymphadenopathy. The degree of  hypermetabolism in these lymph nodes remains highly suspicious for neoplasm. 3. Interval development of low level FDG uptake in 2 stable, small lymph nodes in the left groin region. This may be reactive, but close attention recommended to exclude metastatic involvement. 4. Stable non hypermetabolic low-density liver lesions and the mixed lucent and sclerotic T12 lesion is stable without hypermetabolism today.     REVIEW OF SYSTEMS:   Constitutional: Denies fevers, chills or abnormal weight loss Eyes: Denies blurriness of vision Ears, nose, mouth, throat, and face: Denies mucositis or sore throat Respiratory: Denies cough, dyspnea or wheezes Cardiovascular: Denies palpitation, chest discomfort or lower extremity swelling Gastrointestinal:  Denies nausea, heartburn or change in bowel habits Skin: Denies abnormal skin rashes Lymphatics: Denies new lymphadenopathy or easy bruising Neurological:Denies numbness, tingling or new weaknesses Behavioral/Psych: Mood is stable, no new changes  All other systems were reviewed with the patient and are negative.  I have reviewed the past medical history, past surgical history, social history and family history with the patient and they are unchanged from previous note.  ALLERGIES:  is allergic to sulfa antibiotics and sulfamethoxazole.  MEDICATIONS:  Current Outpatient Medications  Medication Sig Dispense Refill  . blood glucose meter kit and supplies KIT Dispense based on  patient and insurance preference. Use up to four times daily as directed. (FOR ICD-9 250.00, 250.01). 1 each 0  . dexamethasone (DECADRON) 4 MG tablet Take 2 tabs at the night before and 2 tabs the morning of chemotherapy, every 3 weeks, by mouth 24 tablet 0  . Insulin Isophane & Regular Human (NOVOLIN 70/30 FLEXPEN) (70-30) 100 UNIT/ML PEN Inject 25 Units into the skin daily with breakfast. Take 35 units on chemo days.  also pen needles 1/day 15 mL 11  . ketoconazole (NIZORAL) 2 %  cream Apply to web space twice a day for 1 week then daily for 1 month (Patient taking differently: Apply 1 application topically daily as needed for irritation. Apply to web space twice a day for 1 week then daily for 1 month) 15 g 0  . lidocaine-prilocaine (EMLA) cream Apply to affected area once 30 g 3  . lisinopril-hydrochlorothiazide (PRINZIDE,ZESTORETIC) 20-12.5 MG tablet Take 1 tablet by mouth daily. (Patient taking differently: Take 1 tablet by mouth every morning. ) 30 tablet 11  . nitrofurantoin, macrocrystal-monohydrate, (MACROBID) 100 MG capsule Take 1 capsule (100 mg total) by mouth 2 (two) times daily. 14 capsule 0  . ondansetron (ZOFRAN) 8 MG tablet Take 1 tablet (8 mg total) by mouth every 8 (eight) hours as needed for refractory nausea / vomiting. Start on day 3 after chemo. 30 tablet 1  . ONETOUCH DELICA LANCETS 56Y MISC 3 (three) times daily. as directed  5  . ONETOUCH VERIO test strip 3 (three) times daily. as directed  5  . prochlorperazine (COMPAZINE) 10 MG tablet Take 1 tablet (10 mg total) by mouth every 6 (six) hours as needed (Nausea or vomiting). 30 tablet 1  . RELION PEN NEEDLES 32G X 4 MM MISC See admin instructions.  11  . senna-docusate (SENOKOT-S) 8.6-50 MG tablet Take 1 tablet by mouth at bedtime as needed for mild constipation or moderate constipation. 20 tablet 0  . simvastatin (ZOCOR) 10 MG tablet Take 10 mg by mouth at bedtime.     . Vitamin D, Ergocalciferol, 2000 units CAPS Take 2,000 Units by mouth daily.   0   No current facility-administered medications for this visit.    Facility-Administered Medications Ordered in Other Visits  Medication Dose Route Frequency Provider Last Rate Last Dose  . CARBOplatin (PARAPLATIN) 400 mg in sodium chloride 0.9 % 250 mL chemo infusion  400 mg Intravenous Once Alvy Bimler, Hollye Pritt, MD      . fosaprepitant (EMEND) 150 mg, dexamethasone (DECADRON) 4 mg in sodium chloride 0.9 % 145 mL IVPB   Intravenous Once Alvy Bimler, Jarrius Huaracha, MD      .  heparin lock flush 100 unit/mL  500 Units Intracatheter Once PRN Alvy Bimler, Saleha Kalp, MD      . PACLitaxel (TAXOL) 210 mg in sodium chloride 0.9 % 250 mL chemo infusion (> 33m/m2)  105 mg/m2 (Treatment Plan Recorded) Intravenous Once Alawna Graybeal, MD      . sodium chloride flush (NS) 0.9 % injection 10 mL  10 mL Intracatheter PRN GAlvy Bimler Magon Croson, MD        PHYSICAL EXAMINATION: ECOG PERFORMANCE STATUS: 1 - Symptomatic but completely ambulatory  Vitals:   06/13/18 0855  BP: 134/73  Pulse: (!) 117  Resp: 18  Temp: 97.8 F (36.6 C)  SpO2: 100%   Filed Weights   06/13/18 0855  Weight: 184 lb 3.2 oz (83.6 kg)    GENERAL:alert, no distress and comfortable SKIN: skin color, texture, turgor are normal, no rashes or significant  lesions EYES: normal, Conjunctiva are pink and non-injected, sclera clear OROPHARYNX:no exudate, no erythema and lips, buccal mucosa, and tongue normal  NECK: supple, thyroid normal size, non-tender, without nodularity LYMPH:  no palpable lymphadenopathy in the cervical, axillary or inguinal LUNGS: clear to auscultation and percussion with normal breathing effort HEART: tachycardia, regular rate & rhythm and no murmurs and no lower extremity edema ABDOMEN:abdomen soft, non-tender and normal bowel sounds Musculoskeletal:no cyanosis of digits and no clubbing  NEURO: alert & oriented x 3 with fluent speech, no focal motor/sensory deficits  LABORATORY DATA:  I have reviewed the data as listed    Component Value Date/Time   NA 136 06/13/2018 0830   K 4.3 06/13/2018 0830   CL 105 06/13/2018 0830   CO2 19 (L) 06/13/2018 0830   GLUCOSE 342 (H) 06/13/2018 0830   BUN 35 (H) 06/13/2018 0830   CREATININE 1.62 (H) 06/13/2018 0830   CALCIUM 10.2 06/13/2018 0830   PROT 7.8 06/13/2018 0830   ALBUMIN 3.5 06/13/2018 0830   AST 13 (L) 06/13/2018 0830   ALT 10 06/13/2018 0830   ALKPHOS 89 06/13/2018 0830   BILITOT 0.4 06/13/2018 0830   GFRNONAA 33 (L) 06/13/2018 0830   GFRAA 38  (L) 06/13/2018 0830    No results found for: SPEP, UPEP  Lab Results  Component Value Date   WBC 4.0 06/13/2018   NEUTROABS 3.3 06/13/2018   HGB 7.3 (L) 06/13/2018   HCT 22.6 (L) 06/13/2018   MCV 90.4 06/13/2018   PLT 196 06/13/2018      Chemistry      Component Value Date/Time   NA 136 06/13/2018 0830   K 4.3 06/13/2018 0830   CL 105 06/13/2018 0830   CO2 19 (L) 06/13/2018 0830   BUN 35 (H) 06/13/2018 0830   CREATININE 1.62 (H) 06/13/2018 0830      Component Value Date/Time   CALCIUM 10.2 06/13/2018 0830   ALKPHOS 89 06/13/2018 0830   AST 13 (L) 06/13/2018 0830   ALT 10 06/13/2018 0830   BILITOT 0.4 06/13/2018 0830      All questions were answered. The patient knows to call the clinic with any problems, questions or concerns. No barriers to learning was detected.  I spent 30 minutes counseling the patient face to face. The total time spent in the appointment was 40 minutes and more than 50% was on counseling and review of test results  [name], MD 06/13/2018 10:34 AM

## 2018-06-13 NOTE — Assessment & Plan Note (Signed)
This is due to side effects of chemo I plan to prescribe further dose reduction of treatment We discussed some of the risks, benefits, and alternatives of blood transfusions. The patient is symptomatic from anemia and the hemoglobin level is critically low.  Some of the side-effects to be expected including risks of transfusion reactions, chills, infection, syndrome of volume overload and risk of hospitalization from various reasons and the patient is willing to proceed and went ahead to sign consent today. After today's chemotherapy, she will receive blood transfusion as well

## 2018-06-13 NOTE — Assessment & Plan Note (Signed)
She has severe hyperglycemia due to side effects of steroid I will prescribe insulin therapy

## 2018-06-13 NOTE — Assessment & Plan Note (Signed)
She has mild acute on chronic renal failure likely secondary to mild dehydration from hyperglycemia and anemia Will transfuse and prescribe dose reduction as above

## 2018-06-13 NOTE — Patient Instructions (Signed)
   [facility] Discharge Instructions for Patients Receiving Chemotherapy  Today you received the following chemotherapy agents Taxol and Carboplatin   To help prevent nausea and vomiting after your treatment, we encourage you to take your nausea medication as directed.    If you develop nausea and vomiting that is not controlled by your nausea medication, call the clinic.   BELOW ARE SYMPTOMS THAT SHOULD BE REPORTED IMMEDIATELY:  *FEVER GREATER THAN 100.5 F  *CHILLS WITH OR WITHOUT FEVER  NAUSEA AND VOMITING THAT IS NOT CONTROLLED WITH YOUR NAUSEA MEDICATION  *UNUSUAL SHORTNESS OF BREATH  *UNUSUAL BRUISING OR BLEEDING  TENDERNESS IN MOUTH AND THROAT WITH OR WITHOUT PRESENCE OF ULCERS  *URINARY PROBLEMS  *BOWEL PROBLEMS  UNUSUAL RASH Items with * indicate a potential emergency and should be followed up as soon as possible.  Feel free to call the clinic should you have any questions or concerns. The clinic phone number is [phone].  Please show the CHEMO ALERT CARD at check-in to the Emergency Department and triage nurse.   

## 2018-06-13 NOTE — Progress Notes (Signed)
Per Dr. Alvy Bimler, okay to treat today with Hgb 7.3 and creatinine 1.62.

## 2018-06-13 NOTE — Assessment & Plan Note (Signed)
She tolerated chemotherapy poorly with severe pancytopenia and mild acute renal failure We will proceed with treatment today with dose adjustment I plan to repeat imaging study in a month after today's dose for objective assessment of response of therapy

## 2018-06-15 ENCOUNTER — Inpatient Hospital Stay: Payer: Medicare Other

## 2018-06-15 ENCOUNTER — Encounter: Payer: Self-pay | Admitting: *Deleted

## 2018-06-15 VITALS — BP 140/82 | HR 95 | Temp 98.8°F | Resp 16

## 2018-06-15 DIAGNOSIS — C541 Malignant neoplasm of endometrium: Secondary | ICD-10-CM | POA: Diagnosis not present

## 2018-06-15 DIAGNOSIS — N183 Chronic kidney disease, stage 3 (moderate): Secondary | ICD-10-CM | POA: Diagnosis not present

## 2018-06-15 DIAGNOSIS — C7982 Secondary malignant neoplasm of genital organs: Secondary | ICD-10-CM | POA: Diagnosis not present

## 2018-06-15 DIAGNOSIS — Z5111 Encounter for antineoplastic chemotherapy: Secondary | ICD-10-CM | POA: Diagnosis not present

## 2018-06-15 DIAGNOSIS — D61818 Other pancytopenia: Secondary | ICD-10-CM

## 2018-06-15 DIAGNOSIS — C7962 Secondary malignant neoplasm of left ovary: Secondary | ICD-10-CM | POA: Diagnosis not present

## 2018-06-15 DIAGNOSIS — D638 Anemia in other chronic diseases classified elsewhere: Secondary | ICD-10-CM

## 2018-06-15 MED ORDER — HEPARIN SOD (PORK) LOCK FLUSH 100 UNIT/ML IV SOLN
500.0000 [IU] | Freq: Every day | INTRAVENOUS | Status: AC | PRN
  Administered 2018-06-15: 500 [IU]
  Filled 2018-06-15: qty 5

## 2018-06-15 MED ORDER — DIPHENHYDRAMINE HCL 25 MG PO CAPS
ORAL_CAPSULE | ORAL | Status: AC
  Filled 2018-06-15: qty 1

## 2018-06-15 MED ORDER — ACETAMINOPHEN 325 MG PO TABS
ORAL_TABLET | ORAL | Status: AC
  Filled 2018-06-15: qty 2

## 2018-06-15 MED ORDER — DIPHENHYDRAMINE HCL 25 MG PO CAPS
25.0000 mg | ORAL_CAPSULE | Freq: Once | ORAL | Status: AC
  Administered 2018-06-15: 25 mg via ORAL

## 2018-06-15 MED ORDER — SODIUM CHLORIDE 0.9% FLUSH
10.0000 mL | INTRAVENOUS | Status: AC | PRN
  Administered 2018-06-15: 10 mL
  Filled 2018-06-15: qty 10

## 2018-06-15 MED ORDER — SODIUM CHLORIDE 0.9 % IV SOLN
Freq: Once | INTRAVENOUS | Status: AC
  Administered 2018-06-15: 13:00:00 via INTRAVENOUS
  Filled 2018-06-15: qty 250

## 2018-06-15 MED ORDER — ACETAMINOPHEN 325 MG PO TABS
650.0000 mg | ORAL_TABLET | Freq: Once | ORAL | Status: AC
  Administered 2018-06-15: 650 mg via ORAL

## 2018-06-15 NOTE — Progress Notes (Signed)
[place] Work  Clinical Social Work was referred by financial advocate for resources to assist with medical bills.  CSW met with patient in infusion room.  Krista Johnson reported a few medical bills from [facility] and was wondering if there was any assistance, as she has limited income.  CSW is not aware of resources but provided patient with information for [facility] [place].      Krista Maine, LCSW  Clinical Social Worker [facility]

## 2018-06-15 NOTE — Patient Instructions (Signed)
Blood Transfusion, Adult, Care After This sheet gives you information about how to care for yourself after your procedure. Your doctor may also give you more specific instructions. If you have problems or questions, contact your doctor. Follow these instructions at home:   Take over-the-counter and prescription medicines only as told by your doctor.  Go back to your normal activities as told by your doctor.  Follow instructions from your doctor about how to take care of the area where an IV tube was put into your vein (insertion site). Make sure you: ? Wash your hands with soap and water before you change your bandage (dressing). If there is no soap and water, use hand sanitizer. ? Change your bandage as told by your doctor.  Check your IV insertion site every day for signs of infection. Check for: ? More redness, swelling, or pain. ? More fluid or blood. ? Warmth. ? Pus or a bad smell. Contact a doctor if:  You have more redness, swelling, or pain around the IV insertion site.  You have more fluid or blood coming from the IV insertion site.  Your IV insertion site feels warm to the touch.  You have pus or a bad smell coming from the IV insertion site.  Your pee (urine) turns pink, red, or brown.  You feel weak after doing your normal activities. Get help right away if:  You have signs of a serious allergic or body defense (immune) system reaction, including: ? Itchiness. ? Hives. ? Trouble breathing. ? Anxiety. ? Pain in your chest or lower back. ? Fever, flushing, and chills. ? Fast pulse. ? Rash. ? Watery poop (diarrhea). ? Throwing up (vomiting). ? Dark pee. ? Serious headache. ? Dizziness. ? Stiff neck. ? Yellow color in your face or the white parts of your eyes (jaundice). Summary  After a blood transfusion, return to your normal activities as told by your doctor.  Every day, check for signs of infection where the IV tube was put into your vein.  Some  signs of infection are warm skin, more redness and pain, more fluid or blood, and pus or a bad smell where the needle went in.  Contact your doctor if you feel weak or have any unusual symptoms. This information is not intended to replace advice given to you by your health care provider. Make sure you discuss any questions you have with your health care provider. Document Released: 04/25/2014 Document Revised: 11/27/2015 Document Reviewed: 11/27/2015 Elsevier Interactive Patient Education  2019 [employer]  

## 2018-06-16 LAB — TYPE AND SCREEN
ABO/RH(D): AB POS
Antibody Screen: NEGATIVE
Unit division: 0
Unit division: 0

## 2018-06-16 LAB — BPAM RBC
Blood Product Expiration Date: 202003172359
Blood Product Expiration Date: 202003172359
ISSUE DATE / TIME: 202002281312
ISSUE DATE / TIME: 202002281312
Unit Type and Rh: 6200
Unit Type and Rh: 6200

## 2018-07-04 ENCOUNTER — Encounter: Payer: Self-pay | Admitting: Endocrinology

## 2018-07-04 ENCOUNTER — Other Ambulatory Visit: Payer: Self-pay

## 2018-07-04 ENCOUNTER — Ambulatory Visit (INDEPENDENT_AMBULATORY_CARE_PROVIDER_SITE_OTHER): Payer: Medicare Other | Admitting: Endocrinology

## 2018-07-04 VITALS — BP 120/68 | HR 94 | Ht 66.0 in | Wt 185.0 lb

## 2018-07-04 DIAGNOSIS — N183 Chronic kidney disease, stage 3 unspecified: Secondary | ICD-10-CM

## 2018-07-04 DIAGNOSIS — Z794 Long term (current) use of insulin: Secondary | ICD-10-CM | POA: Diagnosis not present

## 2018-07-04 DIAGNOSIS — E1122 Type 2 diabetes mellitus with diabetic chronic kidney disease: Secondary | ICD-10-CM

## 2018-07-04 LAB — POCT GLYCOSYLATED HEMOGLOBIN (HGB A1C): Hemoglobin A1C: 7.1 % — AB (ref 4.0–5.6)

## 2018-07-04 MED ORDER — INSULIN ISOPHANE & REGULAR (HUMAN 70-30)100 UNIT/ML KWIKPEN: 22.0000 [IU] | PEN_INJECTOR | Freq: Every day | SUBCUTANEOUS | 11 refills | Status: DC

## 2018-07-04 NOTE — Progress Notes (Signed)
Subjective:    Patient ID: Krista Johnson, female    DOB: 1951/03/26, 68 y.o.   MRN: [id number]  HPI Pt returns for f/u of diabetes mellitus: DM type: Insulin-requiring type 2.   Dx'ed: 3810 Complications: renal insuff Therapy: insulin since mid-2019 GDM: never (G0) DKA: never Severe hypoglycemia: never Pancreatitis: never Pancreatic imaging: normal on 2019 CT.   Other: she takes qd insulin, after poor results with multiple daily injections.   Interval history: Meter is downloaded today, and the printout is scanned into the record.  cbg varies from 52-166.  It is in general lowest in the afternoon.  She is finished with her chemo days.  She takes 25 units qam.  She had a blood transfusion 2 weeks ago. Pt also has vit-D def and Hyperparathyroidism (prob a combination of primary and secondary).  She takes vit-D, 2000 units per day.   Past Medical History:  Diagnosis Date  . Arthritis    right knee--- last cortisone injection 04/ 2019  . CKD (chronic kidney disease)   . Diabetes mellitus without complication [facility])    endocrinologist-  dr Loanne Drilling  . Endometrial cancer ([place])   . History of colon polyps   . Hyperlipidemia   . Hyperparathyroidism ([place])   . Hypertension     Past Surgical History:  Procedure Laterality Date  . COLONOSCOPY  last one 12-25-2017  . IR IMAGING GUIDED PORT INSERTION  02/20/2018  . ROBOTIC ASSISTED TOTAL HYSTERECTOMY WITH BILATERAL SALPINGO OOPHERECTOMY N/A 01/23/2018   Procedure: XI ROBOTIC ASSISTED TOTAL HYSTERECTOMY WITH BILATERAL SALPINGO OOPHORECTOMY;  Surgeon: Everitt Amber, MD;  Location: [facility];  Service: Gynecology;  Laterality: N/A;  . SENTINEL NODE BIOPSY N/A 01/23/2018   Procedure: SENTINEL NODE BIOPSY;  Surgeon: Everitt Amber, MD;  Location: [facility];  Service: Gynecology;  Laterality: N/A;    Social History   Socioeconomic History  . Marital status: Single    Spouse name: Not on file  . Number of children: 0  . Years of education: Not on file   . Highest education level: Not on file  Occupational History  . Occupation: retired Education officer, [employer]  Social Needs  . Financial resource strain: Not on file  . Food insecurity:    Worry: Not on file    Inability: Not on file  . Transportation needs:    Medical: Not on file    Non-medical: Not on file  Tobacco Use  . Smoking status: Never Smoker  . Smokeless tobacco: Never Used  Substance and Sexual Activity  . Alcohol use: Not Currently    Alcohol/week: 0.0 standard drinks    Frequency: Never  . Drug use: Never  . Sexual activity: Not on file  Lifestyle  . Physical activity:    Days per week: Not on file    Minutes per session: Not on file  . Stress: Not on file  Relationships  . Social connections:    Talks on phone: Not on file    Gets together: Not on file    Attends religious service: Not on file    Active member of club or organization: Not on file    Attends meetings of clubs or organizations: Not on file    Relationship status: Not on file  . Intimate partner violence:    Fear of current or ex partner: Not on file    Emotionally abused: Not on file    Physically abused: Not on file    Forced sexual activity: Not on file  Other Topics Concern  . Not on file  Social History Narrative  . Not on file    Current Outpatient Medications on File Prior to Visit  Medication Sig Dispense Refill  . ketoconazole (NIZORAL) 2 % cream Apply to web space twice a day for 1 week then daily for 1 month (Patient taking differently: Apply 1 application topically daily as needed for irritation. Apply to web space twice a day for 1 week then daily for 1 month) 15 g 0  . lidocaine-prilocaine (EMLA) cream Apply to affected area once 30 g 3  . lisinopril-hydrochlorothiazide (PRINZIDE,ZESTORETIC) 20-12.5 MG tablet Take 1 tablet by mouth daily. (Patient taking differently: Take 1 tablet by mouth every morning. ) 30 tablet 11  . ONETOUCH DELICA LANCETS 09U MISC 3 (three) times daily. as  directed  5  . ONETOUCH VERIO test strip 3 (three) times daily. as directed  5  . RELION PEN NEEDLES 32G X 4 MM MISC See admin instructions.  11  . senna-docusate (SENOKOT-S) 8.6-50 MG tablet Take 1 tablet by mouth at bedtime as needed for mild constipation or moderate constipation. 20 tablet 0  . simvastatin (ZOCOR) 10 MG tablet Take 10 mg by mouth at bedtime.     . Vitamin D, Ergocalciferol, 2000 units CAPS Take 2,000 Units by mouth daily.   0   No current facility-administered medications on file prior to visit.     Allergies  Allergen Reactions  . Sulfa Antibiotics Nausea Only  . Sulfamethoxazole Nausea Only    Family History  Problem Relation Age of Onset  . Diabetes Mother   . Hypertension Mother   . Diabetes Sister   . Hypertension Sister   . Diabetes Maternal Uncle   . Colon cancer Paternal Aunt   . Diabetes Paternal Aunt   . Stomach cancer Neg Hx   . Rectal cancer Neg Hx   . Esophageal cancer Neg Hx   . Colon polyps Neg Hx     BP 120/68 (BP Location: Left Arm, Patient Position: Sitting, Cuff Size: Large)   Pulse 94   Ht 5\' 6"  (1.676 m)   Wt 185 lb (83.9 kg)   BMI 29.86 kg/m    Review of Systems Denies LOC.     Objective:   Physical Exam VITAL SIGNS:  See vs page GENERAL: no distress Pulses: dorsalis pedis intact bilat.   MSK: no deformity of the feet CV: no leg edema Skin:  no ulcer on the feet.  normal color and temp on the feet. Neuro: sensation is intact to touch on the feet   Lab Results  Component Value Date   HGBA1C 7.1 (A) 07/04/2018    Lab Results  Component Value Date   CREATININE 1.62 (H) 06/13/2018   BUN 35 (H) 06/13/2018   NA 136 06/13/2018   K 4.3 06/13/2018   CL 105 06/13/2018   CO2 19 (L) 06/13/2018      Assessment & Plan:  Insulin-requiring type 2 DM: overcontrolled. Hypoglycemia: This limits aggressiveness of glycemic control transfusion, new to me.  This can affect a1c Renal insuff: in this context, she should have a  fast-acting qd insulin  Patient Instructions  Please reduce the insulin to 22 units each morning.   check your blood sugar twice a day.  vary the time of day when you check, between before the 3 meals, and at bedtime.  also check if you have symptoms of your blood sugar being too high or too low.  please keep  a record of the readings and bring it to your next appointment here (or you can bring the meter itself).  You can write it on any piece of paper.  please call us sooner if your blood sugar goes below 70, or if you have a lot of readings over 200. On this type of insulin schedule, you should eat meals on a regular schedule (especially lunch).  If a meal is missed or significantly delayed, your blood sugar could go low.   Please come back for a follow-up appointment in 3 months.

## 2018-07-04 NOTE — Patient Instructions (Addendum)
Please reduce the insulin to 22 units each morning.   check your blood sugar twice a day.  vary the time of day when you check, between before the 3 meals, and at bedtime.  also check if you have symptoms of your blood sugar being too high or too low.  please keep a record of the readings and bring it to your next appointment here (or you can bring the meter itself).  You can write it on any piece of paper.  please call us sooner if your blood sugar goes below 70, or if you have a lot of readings over 200. On this type of insulin schedule, you should eat meals on a regular schedule (especially lunch).  If a meal is missed or significantly delayed, your blood sugar could go low.   Please come back for a follow-up appointment in 3 months.

## 2018-07-09 ENCOUNTER — Encounter (HOSPITAL_COMMUNITY): Payer: Self-pay

## 2018-07-09 ENCOUNTER — Inpatient Hospital Stay: Payer: Medicare Other | Attending: Gynecology

## 2018-07-09 ENCOUNTER — Other Ambulatory Visit: Payer: Self-pay

## 2018-07-09 ENCOUNTER — Ambulatory Visit (HOSPITAL_COMMUNITY)
Admission: RE | Admit: 2018-07-09 | Discharge: 2018-07-09 | Disposition: A | Discharge status: Home / Self Care | Payer: Medicare Other | Source: Ambulatory Visit | Attending: Hematology and Oncology | Admitting: Hematology and Oncology

## 2018-07-09 ENCOUNTER — Inpatient Hospital Stay: Payer: Medicare Other

## 2018-07-09 DIAGNOSIS — Z79899 Other long term (current) drug therapy: Secondary | ICD-10-CM | POA: Diagnosis not present

## 2018-07-09 DIAGNOSIS — Z9221 Personal history of antineoplastic chemotherapy: Secondary | ICD-10-CM | POA: Diagnosis not present

## 2018-07-09 DIAGNOSIS — D638 Anemia in other chronic diseases classified elsewhere: Secondary | ICD-10-CM | POA: Diagnosis not present

## 2018-07-09 DIAGNOSIS — Z794 Long term (current) use of insulin: Secondary | ICD-10-CM | POA: Insufficient documentation

## 2018-07-09 DIAGNOSIS — N183 Chronic kidney disease, stage 3 (moderate): Secondary | ICD-10-CM | POA: Insufficient documentation

## 2018-07-09 DIAGNOSIS — C541 Malignant neoplasm of endometrium: Secondary | ICD-10-CM | POA: Insufficient documentation

## 2018-07-09 DIAGNOSIS — R300 Dysuria: Secondary | ICD-10-CM | POA: Insufficient documentation

## 2018-07-09 DIAGNOSIS — Z923 Personal history of irradiation: Secondary | ICD-10-CM | POA: Insufficient documentation

## 2018-07-09 DIAGNOSIS — D61818 Other pancytopenia: Secondary | ICD-10-CM

## 2018-07-09 DIAGNOSIS — C773 Secondary and unspecified malignant neoplasm of axilla and upper limb lymph nodes: Secondary | ICD-10-CM

## 2018-07-09 LAB — CMP (CANCER CENTER ONLY)
ALT: 14 U/L (ref 0–44)
AST: 17 U/L (ref 15–41)
Albumin: 3.7 g/dL (ref 3.5–5.0)
Alkaline Phosphatase: 82 U/L (ref 38–126)
Anion gap: 11 (ref 5–15)
BUN: 47 mg/dL — ABNORMAL HIGH (ref 8–23)
CO2: 22 mmol/L (ref 22–32)
Calcium: 10.2 mg/dL (ref 8.9–10.3)
Chloride: 105 mmol/L (ref 98–111)
Creatinine: 1.39 mg/dL — ABNORMAL HIGH (ref 0.44–1.00)
GFR, Est AFR Am: 45 mL/min — ABNORMAL LOW (ref >60)
GFR, Estimated: 39 mL/min — ABNORMAL LOW (ref >60)
Glucose, Bld: 103 mg/dL — ABNORMAL HIGH (ref 70–99)
Potassium: 4 mmol/L (ref 3.5–5.1)
Sodium: 138 mmol/L (ref 135–145)
Total Bilirubin: 0.5 mg/dL (ref 0.3–1.2)
Total Protein: 7.5 g/dL (ref 6.5–8.1)

## 2018-07-09 LAB — CBC WITH DIFFERENTIAL (CANCER CENTER ONLY)
Abs Immature Granulocytes: 0.01 10*3/uL (ref 0.00–0.07)
Basophils Absolute: 0 10*3/uL (ref 0.0–0.1)
Basophils Relative: 0 %
Eosinophils Absolute: 0 10*3/uL (ref 0.0–0.5)
Eosinophils Relative: 0 %
HCT: 24.8 % — ABNORMAL LOW (ref 36.0–46.0)
Hemoglobin: 8.2 g/dL — ABNORMAL LOW (ref 12.0–15.0)
Immature Granulocytes: 0 %
Lymphocytes Relative: 33 %
Lymphs Abs: 1.5 10*3/uL (ref 0.7–4.0)
MCH: 30.4 pg (ref 26.0–34.0)
MCHC: 33.1 g/dL (ref 30.0–36.0)
MCV: 91.9 fL (ref 80.0–100.0)
Monocytes Absolute: 0.3 10*3/uL (ref 0.1–1.0)
Monocytes Relative: 6 %
Neutro Abs: 2.8 10*3/uL (ref 1.7–7.7)
Neutrophils Relative %: 61 %
Platelet Count: 113 10*3/uL — ABNORMAL LOW (ref 150–400)
RBC: 2.7 MIL/uL — ABNORMAL LOW (ref 3.87–5.11)
RDW: 16.3 % — ABNORMAL HIGH (ref 11.5–15.5)
WBC Count: 4.6 10*3/uL (ref 4.0–10.5)
nRBC: 0 % (ref 0.0–0.2)

## 2018-07-09 LAB — SAMPLE TO BLOOD BANK

## 2018-07-09 MED ORDER — SODIUM CHLORIDE 0.9% FLUSH
10.0000 mL | Freq: Once | INTRAVENOUS | Status: AC
  Administered 2018-07-09: 10 mL
  Filled 2018-07-09: qty 10

## 2018-07-09 MED ORDER — IOHEXOL 300 MG/ML  SOLN
100.0000 mL | Freq: Once | INTRAMUSCULAR | Status: AC | PRN
  Administered 2018-07-09: 100 mL via INTRAVENOUS

## 2018-07-09 MED ORDER — SODIUM CHLORIDE (PF) 0.9 % IJ SOLN
INTRAMUSCULAR | Status: AC
  Filled 2018-07-09: qty 50

## 2018-07-09 MED ORDER — HEPARIN SOD (PORK) LOCK FLUSH 100 UNIT/ML IV SOLN
INTRAVENOUS | Status: AC
  Administered 2018-07-09: 500 [IU] via INTRAVENOUS
  Filled 2018-07-09: qty 5

## 2018-07-09 MED ORDER — HEPARIN SOD (PORK) LOCK FLUSH 100 UNIT/ML IV SOLN
500.0000 [IU] | Freq: Once | INTRAVENOUS | Status: AC
  Administered 2018-07-09: 500 [IU] via INTRAVENOUS

## 2018-07-10 ENCOUNTER — Other Ambulatory Visit: Payer: Self-pay

## 2018-07-10 ENCOUNTER — Inpatient Hospital Stay: Payer: Medicare Other

## 2018-07-10 ENCOUNTER — Telehealth: Payer: Self-pay

## 2018-07-10 ENCOUNTER — Inpatient Hospital Stay (HOSPITAL_BASED_OUTPATIENT_CLINIC_OR_DEPARTMENT_OTHER): Payer: Medicare Other | Admitting: Hematology and Oncology

## 2018-07-10 ENCOUNTER — Encounter: Payer: Self-pay | Admitting: Hematology and Oncology

## 2018-07-10 DIAGNOSIS — C541 Malignant neoplasm of endometrium: Secondary | ICD-10-CM | POA: Diagnosis not present

## 2018-07-10 DIAGNOSIS — Z794 Long term (current) use of insulin: Secondary | ICD-10-CM | POA: Diagnosis not present

## 2018-07-10 DIAGNOSIS — N183 Chronic kidney disease, stage 3 unspecified: Secondary | ICD-10-CM

## 2018-07-10 DIAGNOSIS — Z9221 Personal history of antineoplastic chemotherapy: Secondary | ICD-10-CM | POA: Diagnosis not present

## 2018-07-10 DIAGNOSIS — D638 Anemia in other chronic diseases classified elsewhere: Secondary | ICD-10-CM | POA: Diagnosis not present

## 2018-07-10 DIAGNOSIS — Z79899 Other long term (current) drug therapy: Secondary | ICD-10-CM

## 2018-07-10 DIAGNOSIS — Z923 Personal history of irradiation: Secondary | ICD-10-CM

## 2018-07-10 DIAGNOSIS — R300 Dysuria: Secondary | ICD-10-CM | POA: Diagnosis not present

## 2018-07-10 LAB — URINALYSIS, COMPLETE (UACMP) WITH MICROSCOPIC
Bilirubin Urine: NEGATIVE
Glucose, UA: 50 mg/dL — AB
Ketones, ur: NEGATIVE mg/dL
Nitrite: POSITIVE — AB
Protein, ur: 100 mg/dL — AB
Specific Gravity, Urine: 1.023 (ref 1.005–1.030)
WBC, UA: >50 WBC/hpf — ABNORMAL HIGH (ref 0–5)
pH: 5 (ref 5.0–8.0)

## 2018-07-10 MED ORDER — CIPROFLOXACIN HCL 500 MG PO TABS: 500.0000 mg | ORAL_TABLET | Freq: Two times a day (BID) | ORAL | 0 refills | Status: AC

## 2018-07-10 NOTE — Progress Notes (Signed)
[place] OFFICE PROGRESS NOTE  Patient Care Team: Jonathon Jordan, MD as PCP - General (Family Medicine)  ASSESSMENT & PLAN:  Endometrial cancer [facility]) She has achieved complete response on imaging study She is recovering well from treatment I have reviewed her case with GYN oncologist Options would include observation only versus antiestrogen therapy versus maintenance treatment with bevacizumab She is undecided I will call her in 2 days for further discussion about plan of care I recommend port flush to maintain port patency every 6 weeks for at least 2 years  The risks, benefits, side effects of observation versus antiestrogen therapy versus bevacizumab were discussed and I have addressed all her questions  Anemia, chronic disease She is not symptomatic.  Observe only.  She does not need transfusion support  Chronic kidney disease (CKD), stage III (moderate) (HCC) Renal function is stable.  Continue risk factor modification  Dysuria She has persistent symptoms of dysuria Urine culture is grossly abnormal Based on her most recent culture and sensitivity, I plan to treat her with ciprofloxacin She was treated with nitrofurantoin a month ago without resolution of her symptoms   Orders Placed This Encounter  Procedures  . Urine Culture    Standing Status:   Future    Number of Occurrences:   1    Standing Expiration Date:   08/14/2019  . Urinalysis, Complete w Microscopic    Standing Status:   Future    Number of Occurrences:   1    Standing Expiration Date:   07/10/2019    INTERVAL HISTORY: Please see below for problem oriented charting. She returns to review test results She complains of UTI symptoms with dysuria, urgency and frequency No hematuria Her energy level is fair The patient denies any recent signs or symptoms of bleeding such as spontaneous epistaxis, hematuria or hematochezia. 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 Leo Grosser who obtained an endometrial biopsy showing poorly differentiated endometrial carcinoma and negative endocervical curettage    12/12/2017 Imaging    MAMMOGRAM FINDINGS: In the right axilla, a possible mass warrants further evaluation. In the left breast, no findings suspicious for malignancy.  Images were processed with CAD.  IMPRESSION: Further evaluation is suggested for possible mass in the right axilla.     12/24/2017 Imaging    Ct scan chest, abdomen and pelvis 1. Marked thickening of the endometrium (42 mm) compatible with known primary endometrial malignancy. No evidence of extrauterine invasion. 2. No pelvic or retroperitoneal adenopathy. 3. Right middle lobe 4 mm solid pulmonary nodule, for which follow-up chest CT is advised in 3-6 months. 4. Several findings that are equivocal for distant metastatic disease. Vaguely nodular heterogeneous hyperenhancement in the peripheral right liver lobe, which could represent benign transient perfusional phenomena, with underlying liver lesions not entirely excluded. Mildly sclerotic T12 vertebral lesion. Mildly enlarged right axillary lymph node. The best single test to further evaluate these findings would be a PET-CT. Alternative tests include bone scan or thoracic MRI without and with IV contrast for the T12 osseous lesion, MRI abdomen without and with IV contrast for the liver findings, and diagnostic mammographic evaluation for  the right axillary node. 5.  Aortic Atherosclerosis (ICD10-I70.0).     01/09/2018 PET scan    1. Moderate hypermetabolism corresponding to enlarging axillary node since  12/22/2017. Highly suspicious for an atypical distribution of metastatic disease. 2. No hypermetabolism to suggest hepatic or T12 osseous metastasis. 3. Hypermetabolic endometrial primary.    01/23/2018 Initial Diagnosis    Endometrial cancer ([place])    01/23/2018 Pathology Results    1. Lymph node, sentinel, biopsy, left external iliac - NO CARCINOMA IDENTIFIED IN ONE LYMPH NODE (0/1) - SEE COMMENT 2. Lymph nodes, regional resection, right para aortic - NO CARCINOMA IDENTIFIED IN FOUR LYMPH NODES (0/4) - SEE COMMENT 3. Lymph nodes, regional resection, right pelvic - NO CARCINOMA IDENTIFIED IN EIGHT LYMPH NODES (0/8) - SEE COMMENT 4. Cul-de-sac biopsy - METASTATIC CARCINOMA 5. Uterus +/- tubes/ovaries, neoplastic, cervix, bilateral fallopian tubes and ovaries UTERUS: - MIXED SEROUS AND ENDOMETRIOID CARCINOMA - SEROSAL IMPLANTS PRESENT - LYMPHOVASCULAR SPACE INVASION PRESENT - LEIOMYOMATA (1.5 CM; LARGEST) - SEE ONCOLOGY TABLE AND COMMENT BELOW CERVIX: - BENIGN NABOTHIAN CYSTS - NO CARCINOMA IDENTIFIED BILATERAL OVARIES: - METASTATIC CARCINOMA PRESENT ON OVARIAN SURFACE BILATERAL FALLOPIAN TUBES: - INTRALUMINAL CARCINOMA Microscopic Comment 1. -3. Cytokeratin AE1/3 was performed on the sentinel lymph nodes to exclude micrometastasis. There is no evidence of metastatic carcinoma by immunohistochemistry. 5. 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%) Histologic Grade: N/A Myometrial invasion: Estimated less than 50% myometrial invasion (0.3 cm of myometrium involved; 1.4 cm measured thickness) Uterine Serosa Involvement: Present Cervical stromal involvement: Not identified Extent of involvement of other organs: - Fallopian tube (left within the  lumen) - Ovary, left (surface involvement) - Cul-de-sac Lymphovascular invasion: Present Regional Lymph Nodes: Examined: 1 Sentinel 12 Non-sentinel 13 Total Lymph nodes with metastasis: 0 Isolated tumor cells (< 0.2 mm): 0 Micrometastasis: (> 0.2 mm and < 2.0 mm): 0 Macrometastasis: (> 2.0 mm): 0 Extracapsular extension: N/A Tumor block for ancillary studies: 5E, 5B MMR / MSI testing: Pending will be reported separately Pathologic Stage Classification (pTNM, AJCC 8th edition): pT3a, pN0 FIGO Stage: IIIA COMMENT: There is tumor present on the surface of the left ovary and within the lumen of the left fallopian tube. The carcinoma appears to be mixed with the largest component being high grade serous carcinoma. Dr. Lyndon Code reviewed the case and agrees with the above diagnosis.    01/23/2018 Surgery    Surgeon: Donaciano Eva   Operation: Robotic-assisted laparoscopic total hysterectomy with bilateral salpingoophorectomy, SLN injection, mapping and biopsy, right pelvic and para-aortic lymphadenectomy  Operative Findings:  : 10-12cm bulky uterus with frank serosal involvement on posterior cul de sac peritoneum and anterior peritoneum which adhesed the bladder to the anterior uterus. Unilateral mapping on left pelvis. No grossly suspicious nodes. Normal omentum and diaphragms.     02/01/2018 Pathology Results    Lymph node, needle/core biopsy, right axilla - METASTATIC CARCINOMA - SEE COMMENT Microscopic Comment The neoplastic cells are positive for cytokeratin 7 and Pax-8 but negative for cytokeratin 5/6, cytokeratin 20, Gata-3, p63, p53 and GCDFP. Overall, the immunoprofile is consistent with metastasis from the patient's known gynecologic carcinoma.    02/01/2018 Procedure    Ultrasound-guided core biopsies of a suspicious right axillary lymph node.    02/21/2018 - 06/13/2018 Chemotherapy    The patient had carboplatin & Taxol x 6  03/26/2018 - 04/30/2018 Radiation Therapy     Radiation treatment dates:   03/26/18, 04/02/18, 04/19/18, 04/23/18 04/30/18  Site/dose: proximal Vagina, 6 Gy in 5 fractions for a total dose of 30 Gy     04/26/2018 PET scan    1. Interval resection of the hypermetabolic endometrial primary. No discernible hypermetabolic pelvic sidewall or peritoneal metastases. 2. Stable hypermetabolic bilateral axillary lymphadenopathy. The degree of hypermetabolism in these lymph nodes remains highly suspicious for neoplasm. 3. Interval development of low level FDG uptake in 2 stable, small lymph nodes in the left groin region. This may be reactive, but close attention recommended to exclude metastatic involvement. 4. Stable non hypermetabolic low-density liver lesions and the mixed lucent and sclerotic T12 lesion is stable without hypermetabolism today.    07/09/2018 Imaging    Status post hysterectomy.  Mild axillary lymphadenopathy, improved. Nodal metastases not excluded.  Irregular wall thickening involving the anterior bladder, possibly reflecting radiation cystitis versus tumor.  Additional stable findings as above, including a 5 mm right middle lobe nodule and stable sclerosis involving the T12 vertebral body.     REVIEW OF SYSTEMS:   Constitutional: Denies fevers, chills or abnormal weight loss Eyes: Denies blurriness of vision Ears, nose, mouth, throat, and face: Denies mucositis or sore throat Respiratory: Denies cough, dyspnea or wheezes Cardiovascular: Denies palpitation, chest discomfort or lower extremity swelling Gastrointestinal:  Denies nausea, heartburn or change in bowel habits Skin: Denies abnormal skin rashes Lymphatics: Denies new lymphadenopathy or easy bruising Neurological:Denies numbness, tingling or new weaknesses Behavioral/Psych: Mood is stable, no new changes  All other systems were reviewed with the patient and are negative.  I have reviewed the past medical history, past surgical history, social history and  family history with the patient and they are unchanged from previous note.  ALLERGIES:  is allergic to sulfa antibiotics and sulfamethoxazole.  MEDICATIONS:  Current Outpatient Medications  Medication Sig Dispense Refill  . Insulin Isophane & Regular Human (NOVOLIN 70/30 FLEXPEN) (70-30) 100 UNIT/ML PEN Inject 22 Units into the skin daily with breakfast. also pen needles 1/day 15 mL 11  . ketoconazole (NIZORAL) 2 % cream Apply to web space twice a day for 1 week then daily for 1 month (Patient taking differently: Apply 1 application topically daily as needed for irritation. Apply to web space twice a day for 1 week then daily for 1 month) 15 g 0  . lidocaine-prilocaine (EMLA) cream Apply to affected area once 30 g 3  . lisinopril-hydrochlorothiazide (PRINZIDE,ZESTORETIC) 20-12.5 MG tablet Take 1 tablet by mouth daily. (Patient taking differently: Take 1 tablet by mouth every morning. ) 30 tablet 11  . ONETOUCH DELICA LANCETS 96E MISC 3 (three) times daily. as directed  5  . ONETOUCH VERIO test strip 3 (three) times daily. as directed  5  . RELION PEN NEEDLES 32G X 4 MM MISC See admin instructions.  11  . senna-docusate (SENOKOT-S) 8.6-50 MG tablet Take 1 tablet by mouth at bedtime as needed for mild constipation or moderate constipation. 20 tablet 0  . simvastatin (ZOCOR) 10 MG tablet Take 10 mg by mouth at bedtime.     . Vitamin D, Ergocalciferol, 2000 units CAPS Take 2,000 Units by mouth daily.   0   No current facility-administered medications for this visit.     PHYSICAL EXAMINATION: ECOG PERFORMANCE STATUS: 1 - Symptomatic but completely ambulatory  Vitals:   07/10/18 0950  BP: 126/88  Pulse: (!) 112  Resp: 18  Temp: 97.7 F (36.5  C)   Filed Weights   07/10/18 0950  Weight: 185 lb 3.2 oz (84 kg)    GENERAL:alert, no distress and comfortable Musculoskeletal:no cyanosis of digits and no clubbing  NEURO: alert & oriented x 3 with fluent speech, no focal motor/sensory  deficits  LABORATORY DATA:  I have reviewed the data as listed    Component Value Date/Time   NA 138 07/09/2018 0933   K 4.0 07/09/2018 0933   CL 105 07/09/2018 0933   CO2 22 07/09/2018 0933   GLUCOSE 103 (H) 07/09/2018 0933   BUN 47 (H) 07/09/2018 0933   CREATININE 1.39 (H) 07/09/2018 0933   CALCIUM 10.2 07/09/2018 0933   PROT 7.5 07/09/2018 0933   ALBUMIN 3.7 07/09/2018 0933   AST 17 07/09/2018 0933   ALT 14 07/09/2018 0933   ALKPHOS 82 07/09/2018 0933   BILITOT 0.5 07/09/2018 0933   GFRNONAA 39 (L) 07/09/2018 0933   GFRAA 45 (L) 07/09/2018 0933    No results found for: SPEP, UPEP  Lab Results  Component Value Date   WBC 4.6 07/09/2018   NEUTROABS 2.8 07/09/2018   HGB 8.2 (L) 07/09/2018   HCT 24.8 (L) 07/09/2018   MCV 91.9 07/09/2018   PLT 113 (L) 07/09/2018      Chemistry      Component Value Date/Time   NA 138 07/09/2018 0933   K 4.0 07/09/2018 0933   CL 105 07/09/2018 0933   CO2 22 07/09/2018 0933   BUN 47 (H) 07/09/2018 0933   CREATININE 1.39 (H) 07/09/2018 0933      Component Value Date/Time   CALCIUM 10.2 07/09/2018 0933   ALKPHOS 82 07/09/2018 0933   AST 17 07/09/2018 0933   ALT 14 07/09/2018 0933   BILITOT 0.5 07/09/2018 0933       RADIOGRAPHIC STUDIES: I have reviewed multiple imaging studies with the patient as well as her GYN oncologist I have personally reviewed the radiological images as listed and agreed with the findings in the report. Ct Chest W Contrast  Result Date: 07/09/2018 CLINICAL DATA:  Endometrial cancer, status post chemotherapy and XRT EXAM: CT CHEST, ABDOMEN, AND PELVIS WITH CONTRAST TECHNIQUE: Multidetector CT imaging of the chest, abdomen and pelvis was performed following the standard protocol during bolus administration of intravenous contrast. CONTRAST:  182m OMNIPAQUE IOHEXOL 300 MG/ML  SOLN COMPARISON:  PET-CT dated 04/26/2018 FINDINGS: CT CHEST FINDINGS Cardiovascular: The heart is normal in size. No pericardial  effusion. No evidence of thoracic aortic aneurysm. Mild coronary atherosclerosis the LAD. Right chest port terminates the cavoatrial junction. Mediastinum/Nodes: Small bilateral axillary nodes, measuring up to 9 mm on the left (previously 12 mm) and 12 mm short axis on the right (previously 17 mm. No suspicious mediastinal or hilar lymphadenopathy. Visualized thyroid is unremarkable. Lungs/Pleura: 5 mm subpleural nodule in the right middle lobe (series 6/image 69), grossly unchanged. No focal consolidation. No pleural effusion or pneumothorax. Musculoskeletal: Mild degenerative changes of the thoracic spine. Stable sclerosis involving the T12 vertebral body (sagittal image 104). CT ABDOMEN PELVIS FINDINGS Hepatobiliary: Liver is within normal limits. Gallbladder is unremarkable. No intrahepatic or extrahepatic ductal dilatation. Pancreas: Within normal limits. Spleen: 16 mm enhancing lesion in the central spleen (series 2/image 49), nonspecific. Adrenals/Urinary Tract: Adrenal glands are within normal limits. 3.4 cm right lower pole renal cyst (series 2/image 68). Left kidney is within normal limits. No hydronephrosis. Bladder is underdistended with irregular wall thickening anteriorly (series 2/image 109). Stomach/Bowel: Stomach is within normal limits. No evidence of bowel  obstruction. Appendix is not discretely visualized. Vascular/Lymphatic: No evidence of abdominal aortic aneurysm. Atherosclerotic calcifications of the abdominal aorta and branch vessels. No suspicious abdominopelvic lymphadenopathy. Small left inguinal nodes measuring up to 8 mm short axis, within normal limits. Reproductive: Status post hysterectomy. No adnexal masses. Other: No abdominopelvic ascites. No frank peritoneal nodularity or omental caking. Musculoskeletal: Mild degenerative changes of the lumbar spine. IMPRESSION: Status post hysterectomy. Mild axillary lymphadenopathy, improved. Nodal metastases not excluded. Irregular wall  thickening involving the anterior bladder, possibly reflecting radiation cystitis versus tumor. Additional stable findings as above, including a 5 mm right middle lobe nodule and stable sclerosis involving the T12 vertebral body. Electronically Signed   By: Julian Hy M.D.   On: 07/09/2018 14:17   Ct Abdomen Pelvis W Contrast  Result Date: 07/09/2018 CLINICAL DATA:  Endometrial cancer, status post chemotherapy and XRT EXAM: CT CHEST, ABDOMEN, AND PELVIS WITH CONTRAST TECHNIQUE: Multidetector CT imaging of the chest, abdomen and pelvis was performed following the standard protocol during bolus administration of intravenous contrast. CONTRAST:  18m OMNIPAQUE IOHEXOL 300 MG/ML  SOLN COMPARISON:  PET-CT dated 04/26/2018 FINDINGS: CT CHEST FINDINGS Cardiovascular: The heart is normal in size. No pericardial effusion. No evidence of thoracic aortic aneurysm. Mild coronary atherosclerosis the LAD. Right chest port terminates the cavoatrial junction. Mediastinum/Nodes: Small bilateral axillary nodes, measuring up to 9 mm on the left (previously 12 mm) and 12 mm short axis on the right (previously 17 mm. No suspicious mediastinal or hilar lymphadenopathy. Visualized thyroid is unremarkable. Lungs/Pleura: 5 mm subpleural nodule in the right middle lobe (series 6/image 69), grossly unchanged. No focal consolidation. No pleural effusion or pneumothorax. Musculoskeletal: Mild degenerative changes of the thoracic spine. Stable sclerosis involving the T12 vertebral body (sagittal image 104). CT ABDOMEN PELVIS FINDINGS Hepatobiliary: Liver is within normal limits. Gallbladder is unremarkable. No intrahepatic or extrahepatic ductal dilatation. Pancreas: Within normal limits. Spleen: 16 mm enhancing lesion in the central spleen (series 2/image 49), nonspecific. Adrenals/Urinary Tract: Adrenal glands are within normal limits. 3.4 cm right lower pole renal cyst (series 2/image 68). Left kidney is within normal limits. No  hydronephrosis. Bladder is underdistended with irregular wall thickening anteriorly (series 2/image 109). Stomach/Bowel: Stomach is within normal limits. No evidence of bowel obstruction. Appendix is not discretely visualized. Vascular/Lymphatic: No evidence of abdominal aortic aneurysm. Atherosclerotic calcifications of the abdominal aorta and branch vessels. No suspicious abdominopelvic lymphadenopathy. Small left inguinal nodes measuring up to 8 mm short axis, within normal limits. Reproductive: Status post hysterectomy. No adnexal masses. Other: No abdominopelvic ascites. No frank peritoneal nodularity or omental caking. Musculoskeletal: Mild degenerative changes of the lumbar spine. IMPRESSION: Status post hysterectomy. Mild axillary lymphadenopathy, improved. Nodal metastases not excluded. Irregular wall thickening involving the anterior bladder, possibly reflecting radiation cystitis versus tumor. Additional stable findings as above, including a 5 mm right middle lobe nodule and stable sclerosis involving the T12 vertebral body. Electronically Signed   By: SJulian HyM.D.   On: 07/09/2018 14:17    All questions were answered. The patient knows to call the clinic with any problems, questions or concerns. No barriers to learning was detected.  I spent 30 minutes counseling the patient face to face. The total time spent in the appointment was 40 minutes and more than 50% was on counseling and review of test results  NHeath Lark MD 07/10/2018 12:35 PM

## 2018-07-10 NOTE — Assessment & Plan Note (Signed)
She has persistent symptoms of dysuria Urine culture is grossly abnormal Based on her most recent culture and sensitivity, I plan to treat her with ciprofloxacin She was treated with nitrofurantoin a month ago without resolution of her symptoms

## 2018-07-10 NOTE — Telephone Encounter (Signed)
Called pt and given below msg. Prescription sent to [facility] on [address] per her request.

## 2018-07-10 NOTE — Assessment & Plan Note (Signed)
She is not symptomatic.  Observe only.  She does not need transfusion support

## 2018-07-10 NOTE — Telephone Encounter (Signed)
-----   Message from [name], MD sent at 07/10/2018 10:56 AM EDT ----- Regarding: UTI Looks like UTI I recommend cipro 500 mg BID PO x 3 days call in to her pharmacy, no refills Call back next week if not better

## 2018-07-10 NOTE — Assessment & Plan Note (Signed)
She has achieved complete response on imaging study She is recovering well from treatment I have reviewed her case with GYN oncologist Options would include observation only versus antiestrogen therapy versus maintenance treatment with bevacizumab She is undecided I will call her in 2 days for further discussion about plan of care I recommend port flush to maintain port patency every 6 weeks for at least 2 years  The risks, benefits, side effects of observation versus antiestrogen therapy versus bevacizumab were discussed and I have addressed all her questions

## 2018-07-10 NOTE — Assessment & Plan Note (Signed)
Renal function is stable.  Continue risk factor modification

## 2018-07-11 ENCOUNTER — Telehealth: Payer: Self-pay | Admitting: Oncology

## 2018-07-11 NOTE — Telephone Encounter (Signed)
Krista Johnson left a message saying that she had questions that she forgot to ask Dr. Alvy Bimler.  Called her back and she said the nurse she spoke to yesterday had answered all her questions.

## 2018-07-12 LAB — URINE CULTURE: Culture: >=100000 — AB

## 2018-07-16 ENCOUNTER — Telehealth: Payer: Self-pay | Admitting: Oncology

## 2018-07-16 ENCOUNTER — Telehealth: Payer: Self-pay | Admitting: Hematology and Oncology

## 2018-07-16 NOTE — Telephone Encounter (Signed)
I spoke with the patient over the telephone The patient is leading towards observation only I will schedule port flushes every 6 weeks and will see her in 6 months I will get GYN oncologist to see her in 3 months.

## 2018-07-16 NOTE — Progress Notes (Signed)
Screened for COVID-19 all symptoms negative

## 2018-07-16 NOTE — Telephone Encounter (Signed)
Called Krista Johnson and advised her of follow up appointment with Dr. Denman George on 10/12/18 at 11:45.  Also discussed having a CT scan before seeing Dr. Denman George.  She said that she would like to have one.  Advised her that we will call her back closer to June to schedule it.

## 2018-07-18 ENCOUNTER — Other Ambulatory Visit: Payer: Self-pay | Admitting: Hematology and Oncology

## 2018-07-18 ENCOUNTER — Ambulatory Visit: Payer: Medicare Other | Admitting: Gynecologic Oncology

## 2018-07-18 ENCOUNTER — Encounter: Payer: Self-pay | Admitting: General Practice

## 2018-07-18 DIAGNOSIS — C541 Malignant neoplasm of endometrium: Secondary | ICD-10-CM

## 2018-07-18 NOTE — Progress Notes (Signed)
[place] Team contacted patient to assess for food insecurity and other psychosocial needs during current COVID19 pandemic.  "I feel good, I feel blessed!"  Has been able to manage because she is retired - "was told my chemo was working for me", much relief.   Helps care for her elderly mother, sister cooks for her and supplies food.  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    Patient/family expressed no needs at this time.  Support Team member encouraged patient to call if changes occur or they have any other questions/concerns.   Beverely Pace, [place]

## 2018-07-19 ENCOUNTER — Telehealth: Payer: Self-pay | Admitting: Hematology and Oncology

## 2018-07-19 NOTE — Telephone Encounter (Signed)
Scheduled per sch msg. Will mail printout °

## 2018-07-25 ENCOUNTER — Ambulatory Visit: Payer: Medicare Other | Admitting: Podiatry

## 2018-08-02 DIAGNOSIS — M25562 Pain in left knee: Secondary | ICD-10-CM | POA: Diagnosis not present

## 2018-08-02 DIAGNOSIS — G8929 Other chronic pain: Secondary | ICD-10-CM | POA: Diagnosis not present

## 2018-08-20 ENCOUNTER — Other Ambulatory Visit: Payer: Self-pay | Admitting: Hematology and Oncology

## 2018-08-20 DIAGNOSIS — C541 Malignant neoplasm of endometrium: Secondary | ICD-10-CM

## 2018-08-27 ENCOUNTER — Inpatient Hospital Stay: Payer: Medicare Other | Attending: Gynecology

## 2018-08-27 ENCOUNTER — Other Ambulatory Visit: Payer: Self-pay

## 2018-08-27 DIAGNOSIS — Z9221 Personal history of antineoplastic chemotherapy: Secondary | ICD-10-CM | POA: Insufficient documentation

## 2018-08-27 DIAGNOSIS — Z923 Personal history of irradiation: Secondary | ICD-10-CM | POA: Diagnosis not present

## 2018-08-27 DIAGNOSIS — Z452 Encounter for adjustment and management of vascular access device: Secondary | ICD-10-CM | POA: Diagnosis not present

## 2018-08-27 DIAGNOSIS — C541 Malignant neoplasm of endometrium: Secondary | ICD-10-CM | POA: Diagnosis not present

## 2018-08-27 DIAGNOSIS — C773 Secondary and unspecified malignant neoplasm of axilla and upper limb lymph nodes: Secondary | ICD-10-CM

## 2018-08-27 MED ORDER — HEPARIN SOD (PORK) LOCK FLUSH 100 UNIT/ML IV SOLN
500.0000 [IU] | Freq: Once | INTRAVENOUS | Status: AC
  Administered 2018-08-27: 500 [IU]
  Filled 2018-08-27: qty 5

## 2018-08-27 MED ORDER — SODIUM CHLORIDE 0.9% FLUSH
10.0000 mL | Freq: Once | INTRAVENOUS | Status: AC
  Administered 2018-08-27: 10 mL
  Filled 2018-08-27: qty 10

## 2018-08-30 ENCOUNTER — Ambulatory Visit: Payer: Medicare Other | Admitting: Podiatry

## 2018-09-11 ENCOUNTER — Telehealth (HOSPITAL_COMMUNITY): Payer: Self-pay

## 2018-09-11 ENCOUNTER — Other Ambulatory Visit: Payer: Self-pay

## 2018-09-11 DIAGNOSIS — Z794 Long term (current) use of insulin: Secondary | ICD-10-CM

## 2018-09-11 DIAGNOSIS — E1122 Type 2 diabetes mellitus with diabetic chronic kidney disease: Secondary | ICD-10-CM

## 2018-09-11 DIAGNOSIS — N183 Chronic kidney disease, stage 3 unspecified: Secondary | ICD-10-CM

## 2018-09-11 MED ORDER — ONETOUCH DELICA LANCETS 33G MISC: 1.0000 | Freq: Two times a day (BID) | 2 refills | Status: DC

## 2018-09-12 ENCOUNTER — Telehealth: Payer: Self-pay | Admitting: Oncology

## 2018-09-12 NOTE — Telephone Encounter (Signed)
Called Krista Johnson and notified her of appointment for CT scan on 10/10/18 at 9:30 (arrival at 9:15, NPO 4 hours before, drink contrast at 7:30 and 8:30).  Also notified her of appointment with Dr. Denman George on 10/12/18 at 11:45.  She verbalized agreement and understanding.

## 2018-09-17 ENCOUNTER — Telehealth: Payer: Self-pay | Admitting: Endocrinology

## 2018-09-17 NOTE — Telephone Encounter (Signed)
Please move up f/u to next available.

## 2018-09-17 NOTE — Telephone Encounter (Signed)
Please advise 

## 2018-09-17 NOTE — Telephone Encounter (Signed)
Patient states that they put her Cortizone shot in a different knee and her blood sugar went up. Her blood sugars are now normal and now would like to get clearance to get the other shot in the other knee.  Please Advise, Thanks

## 2018-09-17 NOTE — Telephone Encounter (Signed)
At Dr. Cordelia Pen request, called pt to reschedule 10/16/18 appt to a sooner date and time. Will address her concerns/request at that visit. LVM requesting returned call.

## 2018-09-18 ENCOUNTER — Other Ambulatory Visit: Payer: Self-pay

## 2018-09-18 ENCOUNTER — Telehealth: Payer: Self-pay | Admitting: Hematology and Oncology

## 2018-09-18 NOTE — Telephone Encounter (Signed)
Scheduled appt per sch msg. Called and spoke with patient. Confirmed date and time °

## 2018-09-18 NOTE — Telephone Encounter (Signed)
Appt rescheduled to 09/20/18

## 2018-09-19 ENCOUNTER — Encounter: Payer: Self-pay | Admitting: Podiatry

## 2018-09-19 ENCOUNTER — Ambulatory Visit (INDEPENDENT_AMBULATORY_CARE_PROVIDER_SITE_OTHER): Payer: Medicare Other | Admitting: Podiatry

## 2018-09-19 VITALS — Temp 97.9°F

## 2018-09-19 DIAGNOSIS — G620 Drug-induced polyneuropathy: Secondary | ICD-10-CM

## 2018-09-19 DIAGNOSIS — B351 Tinea unguium: Secondary | ICD-10-CM | POA: Diagnosis not present

## 2018-09-19 DIAGNOSIS — T451X5A Adverse effect of antineoplastic and immunosuppressive drugs, initial encounter: Secondary | ICD-10-CM

## 2018-09-19 DIAGNOSIS — M79675 Pain in left toe(s): Secondary | ICD-10-CM | POA: Diagnosis not present

## 2018-09-19 DIAGNOSIS — Z794 Long term (current) use of insulin: Secondary | ICD-10-CM

## 2018-09-19 DIAGNOSIS — M79674 Pain in right toe(s): Secondary | ICD-10-CM | POA: Diagnosis not present

## 2018-09-19 DIAGNOSIS — E119 Type 2 diabetes mellitus without complications: Secondary | ICD-10-CM

## 2018-09-19 NOTE — Patient Instructions (Signed)
Diabetes Mellitus and Foot Care  Foot care is an important part of your health, especially when you have diabetes. Diabetes may cause you to have problems because of poor blood flow (circulation) to your feet and legs, which can cause your skin to:   Become thinner and drier.   Break more easily.   Heal more slowly.   Peel and crack.  You may also have nerve damage (neuropathy) in your legs and feet, causing decreased feeling in them. This means that you may not notice minor injuries to your feet that could lead to more serious problems. Noticing and addressing any potential problems early is the best way to prevent future foot problems.  How to care for your feet  Foot hygiene   Wash your feet daily with warm water and mild soap. Do not use hot water. Then, pat your feet and the areas between your toes until they are completely dry. Do not soak your feet as this can dry your skin.   Trim your toenails straight across. Do not dig under them or around the cuticle. File the edges of your nails with an emery board or nail file.   Apply a moisturizing lotion or petroleum jelly to the skin on your feet and to dry, brittle toenails. Use lotion that does not contain alcohol and is unscented. Do not apply lotion between your toes.  Shoes and socks   Wear clean socks or stockings every day. Make sure they are not too tight. Do not wear knee-high stockings since they may decrease blood flow to your legs.   Wear shoes that fit properly and have enough cushioning. Always look in your shoes before you put them on to be sure there are no objects inside.   To break in new shoes, wear them for just a few hours a day. This prevents injuries on your feet.  Wounds, scrapes, corns, and calluses   Check your feet daily for blisters, cuts, bruises, sores, and redness. If you cannot see the bottom of your feet, use a mirror or ask someone for help.   Do not cut corns or calluses or try to remove them with medicine.   If you  find a minor scrape, cut, or break in the skin on your feet, keep it and the skin around it clean and dry. You may clean these areas with mild soap and water. Do not clean the area with peroxide, alcohol, or iodine.   If you have a wound, scrape, corn, or callus on your foot, look at it several times a day to make sure it is healing and not infected. Check for:  ? Redness, swelling, or pain.  ? Fluid or blood.  ? Warmth.  ? Pus or a bad smell.  General instructions   Do not cross your legs. This may decrease blood flow to your feet.   Do not use heating pads or hot water bottles on your feet. They may burn your skin. If you have lost feeling in your feet or legs, you may not know this is happening until it is too late.   Protect your feet from hot and cold by wearing shoes, such as at the beach or on hot pavement.   Schedule a complete foot exam at least once a year (annually) or more often if you have foot problems. If you have foot problems, report any cuts, sores, or bruises to your health care provider immediately.  Contact a health care provider if:     You have a medical condition that increases your risk of infection and you have any cuts, sores, or bruises on your feet.   You have an injury that is not healing.   You have redness on your legs or feet.   You feel burning or tingling in your legs or feet.   You have pain or cramps in your legs and feet.   Your legs or feet are numb.   Your feet always feel cold.   You have pain around a toenail.  Get help right away if:   You have a wound, scrape, corn, or callus on your foot and:  ? You have pain, swelling, or redness that gets worse.  ? You have fluid or blood coming from the wound, scrape, corn, or callus.  ? Your wound, scrape, corn, or callus feels warm to the touch.  ? You have pus or a bad smell coming from the wound, scrape, corn, or callus.  ? You have a fever.  ? You have a red line going up your leg.  Summary   Check your feet every day  for cuts, sores, red spots, swelling, and blisters.   Moisturize feet and legs daily.   Wear shoes that fit properly and have enough cushioning.   If you have foot problems, report any cuts, sores, or bruises to your health care provider immediately.   Schedule a complete foot exam at least once a year (annually) or more often if you have foot problems.  This information is not intended to replace advice given to you by your health care provider. Make sure you discuss any questions you have with your health care provider.  Document Released: 04/01/2000 Document Revised: 05/17/2017 Document Reviewed: 05/06/2016  Elsevier Interactive Patient Education  2019 [employer] Inc[employer]

## 2018-09-20 ENCOUNTER — Other Ambulatory Visit: Payer: Self-pay

## 2018-09-20 ENCOUNTER — Encounter: Payer: Self-pay | Admitting: Endocrinology

## 2018-09-20 ENCOUNTER — Ambulatory Visit (INDEPENDENT_AMBULATORY_CARE_PROVIDER_SITE_OTHER): Payer: Medicare Other | Admitting: Endocrinology

## 2018-09-20 VITALS — BP 124/82 | HR 105 | Ht 66.0 in | Wt 197.0 lb

## 2018-09-20 DIAGNOSIS — IMO0001 Reserved for inherently not codable concepts without codable children: Secondary | ICD-10-CM

## 2018-09-20 DIAGNOSIS — E1165 Type 2 diabetes mellitus with hyperglycemia: Secondary | ICD-10-CM

## 2018-09-20 DIAGNOSIS — Z794 Long term (current) use of insulin: Secondary | ICD-10-CM

## 2018-09-20 DIAGNOSIS — N183 Chronic kidney disease, stage 3 unspecified: Secondary | ICD-10-CM

## 2018-09-20 DIAGNOSIS — E1122 Type 2 diabetes mellitus with diabetic chronic kidney disease: Secondary | ICD-10-CM

## 2018-09-20 LAB — POCT GLYCOSYLATED HEMOGLOBIN (HGB A1C): Hemoglobin A1C: 6.9 % — AB (ref 4.0–5.6)

## 2018-09-20 MED ORDER — INSULIN ISOPHANE & REGULAR (HUMAN 70-30)100 UNIT/ML KWIKPEN: 20.0000 [IU] | PEN_INJECTOR | Freq: Every day | SUBCUTANEOUS | 11 refills | Status: DC

## 2018-09-20 NOTE — Progress Notes (Signed)
Subjective:    Patient ID: Krista Johnson, female    DOB: December 20, 1950, 68 y.o.   MRN: [id number]  HPI Pt returns for f/u of diabetes mellitus:  DM type: Insulin-requiring type 2.   Dx'ed: 4270 Complications: renal insuff Therapy: insulin since mid-2019 GDM: never (G0) DKA: never Severe hypoglycemia: never Pancreatitis: never Pancreatic imaging: normal on 2019 CT.   Other: she takes qd insulin, after poor results with multiple daily injections.   Interval history: Meter is downloaded today, and the printout is scanned into the record.  cbg varies from 62-150.  It is in general lowest in the afternoon.  She is finished with her chemo days.  She takes 25 units qam.  She had a blood transfusion 2 weeks ago.   Pt also has vit-D def and Hyperparathyroidism (prob a combination of primary and secondary).  She takes vit-D, 2000 units per day.  She had a steroid injection into the left knee 2 weeks ago.   Past Medical History:  Diagnosis Date  . Arthritis    right knee--- last cortisone injection 04/ 2019  . CKD (chronic kidney disease)   . Diabetes mellitus without complication [facility])    endocrinologist-  dr Loanne Drilling  . Endometrial cancer ([place])   . History of colon polyps   . Hyperlipidemia   . Hyperparathyroidism ([place])   . Hypertension     Past Surgical History:  Procedure Laterality Date  . COLONOSCOPY  last one 12-25-2017  . IR IMAGING GUIDED PORT INSERTION  02/20/2018  . ROBOTIC ASSISTED TOTAL HYSTERECTOMY WITH BILATERAL SALPINGO OOPHERECTOMY N/A 01/23/2018   Procedure: XI ROBOTIC ASSISTED TOTAL HYSTERECTOMY WITH BILATERAL SALPINGO OOPHORECTOMY;  Surgeon: Everitt Amber, MD;  Location: [facility];  Service: Gynecology;  Laterality: N/A;  . SENTINEL NODE BIOPSY N/A 01/23/2018   Procedure: SENTINEL NODE BIOPSY;  Surgeon: Everitt Amber, MD;  Location: [facility];  Service: Gynecology;  Laterality: N/A;    Social History   Socioeconomic History  . Marital status: Single    Spouse name: Not on  file  . Number of children: 0  . Years of education: Not on file  . Highest education level: Not on file  Occupational History  . Occupation: retired Education officer, museum  Social Needs  . Financial resource strain: Not on file  . Food insecurity:    Worry: Not on file    Inability: Not on file  . Transportation needs:    Medical: Not on file    Non-medical: Not on file  Tobacco Use  . Smoking status: Never Smoker  . Smokeless tobacco: Never Used  Substance and Sexual Activity  . Alcohol use: Not Currently    Alcohol/week: 0.0 standard drinks    Frequency: Never  . Drug use: Never  . Sexual activity: Not on file  Lifestyle  . Physical activity:    Days per week: Not on file    Minutes per session: Not on file  . Stress: Not on file  Relationships  . Social connections:    Talks on phone: Not on file    Gets together: Not on file    Attends religious service: Not on file    Active member of club or organization: Not on file    Attends meetings of clubs or organizations: Not on file    Relationship status: Not on file  . Intimate partner violence:    Fear of current or ex partner: Not on file    Emotionally abused: Not on file  Physically abused: Not on file    Forced sexual activity: Not on file  Other Topics Concern  . Not on file  Social History Narrative  . Not on file    Current Outpatient Medications on File Prior to Visit  Medication Sig Dispense Refill  . ketoconazole (NIZORAL) 2 % cream Apply to web space twice a day for 1 week then daily for 1 month (Patient taking differently: Apply 1 application topically daily as needed for irritation. Apply to web space twice a day for 1 week then daily for 1 month) 15 g 0  . lidocaine-prilocaine (EMLA) cream Apply to affected area once 30 g 3  . lisinopril-hydrochlorothiazide (PRINZIDE,ZESTORETIC) 20-12.5 MG tablet Take 1 tablet by mouth daily. (Patient taking differently: Take 1 tablet by mouth every morning. ) 30 tablet 11   . OneTouch Delica Lancets 45X MISC 1 each by Other route 2 (two) times daily. Use to monitor glucose levels BID; E11.65 100 each 2  . ONETOUCH VERIO test strip 3 (three) times daily. as directed  5  . RELION PEN NEEDLES 32G X 4 MM MISC See admin instructions.  11  . senna-docusate (SENOKOT-S) 8.6-50 MG tablet Take 1 tablet by mouth at bedtime as needed for mild constipation or moderate constipation. 20 tablet 0  . simvastatin (ZOCOR) 10 MG tablet Take 10 mg by mouth at bedtime.     . Vitamin D, Ergocalciferol, 2000 units CAPS Take 2,000 Units by mouth daily.   0   No current facility-administered medications on file prior to visit.     Allergies  Allergen Reactions  . Sulfa Antibiotics Nausea Only  . Sulfamethoxazole Nausea Only    Family History  Problem Relation Age of Onset  . Diabetes Mother   . Hypertension Mother   . Diabetes Sister   . Hypertension Sister   . Diabetes Maternal Uncle   . Colon cancer Paternal Aunt   . Diabetes Paternal Aunt   . Stomach cancer Neg Hx   . Rectal cancer Neg Hx   . Esophageal cancer Neg Hx   . Colon polyps Neg Hx     BP 124/82 (BP Location: Left Arm, Patient Position: Sitting, Cuff Size: Large)   Pulse (!) 105   Ht 5\' 6"  (1.676 m)   Wt 197 lb (89.4 kg)   SpO2 97%   BMI 31.80 kg/m   Review of Systems She denies hypoglycemia    Objective:   Physical Exam VITAL SIGNS:  See vs page GENERAL: no distress Pulses: dorsalis pedis intact bilat.   MSK: no deformity of the feet CV: no leg edema Skin:  no ulcer on the feet.  normal color and temp on the feet. Neuro: sensation is intact to touch on the feet.     Lab Results  Component Value Date   HGBA1C 6.9 (A) 09/20/2018   Lab Results  Component Value Date   CREATININE 1.39 (H) 07/09/2018   BUN 47 (H) 07/09/2018   NA 138 07/09/2018   K 4.0 07/09/2018   CL 105 07/09/2018   CO2 22 07/09/2018       Assessment & Plan:  Insulin-requiring type 2 DM: overcontrolled, this is the  best control this pt should aim for, given this regimen, which does match insulin to his changing needs throughout the day.  Hypoglycemia: This limits aggressiveness of glycemic control.  Renal insuff: in this context, he does not need a PM insulin dose.  Knee pain: steroid rx is affecting glycemic control.  Patient Instructions  Please reduce the insulin to 20 units with breakfast.   check your blood sugar twice a day.  vary the time of day when you check, between before the 3 meals, and at bedtime.  also check if you have symptoms of your blood sugar being too high or too low.  please keep a record of the readings and bring it to your next appointment here (or you can bring the meter itself).  You can write it on any piece of paper.  please call us sooner if your blood sugar goes below 70, or if you have a lot of readings over 200. On this type of insulin schedule, you should eat meals on a regular schedule (especially lunch).  If a meal is missed or significantly delayed, your blood sugar could go low.   Please come back for a follow-up appointment in 3 months.

## 2018-09-20 NOTE — Patient Instructions (Addendum)
Please reduce the insulin to 20 units with breakfast.   check your blood sugar twice a day.  vary the time of day when you check, between before the 3 meals, and at bedtime.  also check if you have symptoms of your blood sugar being too high or too low.  please keep a record of the readings and bring it to your next appointment here (or you can bring the meter itself).  You can write it on any piece of paper.  please call us sooner if your blood sugar goes below 70, or if you have a lot of readings over 200. On this type of insulin schedule, you should eat meals on a regular schedule (especially lunch).  If a meal is missed or significantly delayed, your blood sugar could go low.   Please come back for a follow-up appointment in 3 months.

## 2018-09-23 NOTE — Progress Notes (Signed)
Subjective: Krista Johnson presents for preventative foot care on today with elongated, thick toenails 1-5 b/l that she cannot cut and which interfere with daily activities.  Pain is aggravated when wearing enclosed shoe gear. She is diabetic. She also has h/o chemotherapy induced neuropathy and admits numbness of toes of both feet.  Jonathon Jordan, MD is her PCP.    Current Outpatient Medications:  .  ketoconazole (NIZORAL) 2 % cream, Apply to web space twice a day for 1 week then daily for 1 month (Patient taking differently: Apply 1 application topically daily as needed for irritation. Apply to web space twice a day for 1 week then daily for 1 month), Disp: 15 g, Rfl: 0 .  lidocaine-prilocaine (EMLA) cream, Apply to affected area once, Disp: 30 g, Rfl: 3 .  lisinopril-hydrochlorothiazide (PRINZIDE,ZESTORETIC) 20-12.5 MG tablet, Take 1 tablet by mouth daily. (Patient taking differently: Take 1 tablet by mouth every morning. ), Disp: 30 tablet, Rfl: 11 .  OneTouch Delica Lancets 21Y MISC, 1 each by Other route 2 (two) times daily. Use to monitor glucose levels BID; E11.65, Disp: 100 each, Rfl: 2 .  ONETOUCH VERIO test strip, 3 (three) times daily. as directed, Disp: , Rfl: 5 .  RELION PEN NEEDLES 32G X 4 MM MISC, See admin instructions., Disp: , Rfl: 11 .  senna-docusate (SENOKOT-S) 8.6-50 MG tablet, Take 1 tablet by mouth at bedtime as needed for mild constipation or moderate constipation., Disp: 20 tablet, Rfl: 0 .  simvastatin (ZOCOR) 10 MG tablet, Take 10 mg by mouth at bedtime. , Disp: , Rfl:  .  Vitamin D, Ergocalciferol, 2000 units CAPS, Take 2,000 Units by mouth daily. , Disp: , Rfl: 0 .  Insulin Isophane & Regular Human (NOVOLIN 70/30 FLEXPEN) (70-30) 100 UNIT/ML PEN, Inject 20 Units into the skin daily with breakfast. also pen needles 1/day, Disp: 15 mL, Rfl: 11  Allergies  Allergen Reactions  . Sulfa Antibiotics Nausea Only  . Sulfamethoxazole Nausea Only    Objective: Vitals:    09/19/18 0920  Temp: 97.9 F (36.6 C)    Vascular Examination: Capillary refill time immediate x 10 digits.  Dorsalis pedis and Posterior tibial pulses palpable b/l.  Digital hair decreased b/l feet.  Skin temperature gradient WNL b/l.  Dermatological Examination: Skin with normal turgor, texture and tone b/l.  Toenails 1-5 b/l discolored, thick, dystrophic with subungual debris and pain with palpation to nailbeds due to thickness of nails.  Musculoskeletal: Muscle strength 5/5 to all LE muscle groups  No gross bony deformities b/l.  No pain, crepitus or joint limitation noted with ROM.   Neurological: Sensation intact with 10 gram monofilament.  Vibratory sensation decreased b/l.  Proprioceptive sensation intact b/l.  Assessment: Painful onychomycosis toenails 1-5 b/l  Chemotherapy induced neuropathy  Plan: 1. Toenails 1-5 b/l were debrided in length and girth without iatrogenic bleeding. 2. Patient to continue soft, supportive shoe gear daily. 3. Patient to report any pedal injuries to medical professional immediately. 4. Follow up 3 months.  5. Patient/POA to call should there be a concern in the interim.

## 2018-09-25 DIAGNOSIS — G8929 Other chronic pain: Secondary | ICD-10-CM | POA: Diagnosis not present

## 2018-09-25 DIAGNOSIS — M25561 Pain in right knee: Secondary | ICD-10-CM | POA: Diagnosis not present

## 2018-10-01 ENCOUNTER — Other Ambulatory Visit: Payer: Medicare Other

## 2018-10-10 ENCOUNTER — Ambulatory Visit (HOSPITAL_COMMUNITY)
Admission: RE | Admit: 2018-10-10 | Discharge: 2018-10-10 | Disposition: A | Discharge status: Home / Self Care | Payer: Medicare Other | Source: Ambulatory Visit | Attending: Hematology and Oncology | Admitting: Hematology and Oncology

## 2018-10-10 ENCOUNTER — Inpatient Hospital Stay: Payer: Medicare Other | Attending: Gynecology

## 2018-10-10 ENCOUNTER — Inpatient Hospital Stay: Payer: Medicare Other

## 2018-10-10 ENCOUNTER — Other Ambulatory Visit: Payer: Self-pay

## 2018-10-10 DIAGNOSIS — C541 Malignant neoplasm of endometrium: Secondary | ICD-10-CM

## 2018-10-10 DIAGNOSIS — Z9221 Personal history of antineoplastic chemotherapy: Secondary | ICD-10-CM | POA: Insufficient documentation

## 2018-10-10 DIAGNOSIS — Z90722 Acquired absence of ovaries, bilateral: Secondary | ICD-10-CM | POA: Diagnosis not present

## 2018-10-10 DIAGNOSIS — Z90710 Acquired absence of both cervix and uterus: Secondary | ICD-10-CM | POA: Diagnosis not present

## 2018-10-10 DIAGNOSIS — C786 Secondary malignant neoplasm of retroperitoneum and peritoneum: Secondary | ICD-10-CM | POA: Diagnosis not present

## 2018-10-10 DIAGNOSIS — C773 Secondary and unspecified malignant neoplasm of axilla and upper limb lymph nodes: Secondary | ICD-10-CM | POA: Diagnosis not present

## 2018-10-10 DIAGNOSIS — Z923 Personal history of irradiation: Secondary | ICD-10-CM | POA: Insufficient documentation

## 2018-10-10 LAB — COMPREHENSIVE METABOLIC PANEL
ALT: 24 U/L (ref 0–44)
AST: 15 U/L (ref 15–41)
Albumin: 3.7 g/dL (ref 3.5–5.0)
Alkaline Phosphatase: 80 U/L (ref 38–126)
Anion gap: 11 (ref 5–15)
BUN: 29 mg/dL — ABNORMAL HIGH (ref 8–23)
CO2: 23 mmol/L (ref 22–32)
Calcium: 10.2 mg/dL (ref 8.9–10.3)
Chloride: 106 mmol/L (ref 98–111)
Creatinine, Ser: 1.46 mg/dL — ABNORMAL HIGH (ref 0.44–1.00)
GFR calc Af Amer: 43 mL/min — ABNORMAL LOW (ref >60)
GFR calc non Af Amer: 37 mL/min — ABNORMAL LOW (ref >60)
Glucose, Bld: 166 mg/dL — ABNORMAL HIGH (ref 70–99)
Potassium: 4.3 mmol/L (ref 3.5–5.1)
Sodium: 140 mmol/L (ref 135–145)
Total Bilirubin: 0.4 mg/dL (ref 0.3–1.2)
Total Protein: 7.6 g/dL (ref 6.5–8.1)

## 2018-10-10 LAB — CBC WITH DIFFERENTIAL/PLATELET
Abs Immature Granulocytes: 0.01 10*3/uL (ref 0.00–0.07)
Basophils Absolute: 0 10*3/uL (ref 0.0–0.1)
Basophils Relative: 0 %
Eosinophils Absolute: 0.1 10*3/uL (ref 0.0–0.5)
Eosinophils Relative: 1 %
HCT: 33.9 % — ABNORMAL LOW (ref 36.0–46.0)
Hemoglobin: 10.9 g/dL — ABNORMAL LOW (ref 12.0–15.0)
Immature Granulocytes: 0 %
Lymphocytes Relative: 28 %
Lymphs Abs: 1.4 10*3/uL (ref 0.7–4.0)
MCH: 28.7 pg (ref 26.0–34.0)
MCHC: 32.2 g/dL (ref 30.0–36.0)
MCV: 89.2 fL (ref 80.0–100.0)
Monocytes Absolute: 0.3 10*3/uL (ref 0.1–1.0)
Monocytes Relative: 6 %
Neutro Abs: 3.2 10*3/uL (ref 1.7–7.7)
Neutrophils Relative %: 65 %
Platelets: 158 10*3/uL (ref 150–400)
RBC: 3.8 MIL/uL — ABNORMAL LOW (ref 3.87–5.11)
RDW: 11.9 % (ref 11.5–15.5)
WBC: 4.9 10*3/uL (ref 4.0–10.5)
nRBC: 0 % (ref 0.0–0.2)

## 2018-10-10 MED ORDER — SODIUM CHLORIDE (PF) 0.9 % IJ SOLN
INTRAMUSCULAR | Status: AC
  Filled 2018-10-10: qty 50

## 2018-10-10 MED ORDER — IOHEXOL 300 MG/ML  SOLN
100.0000 mL | Freq: Once | INTRAMUSCULAR | Status: AC | PRN
  Administered 2018-10-10: 80 mL via INTRAVENOUS

## 2018-10-10 MED ORDER — SODIUM CHLORIDE 0.9% FLUSH
10.0000 mL | Freq: Once | INTRAVENOUS | Status: AC
  Administered 2018-10-10: 10 mL
  Filled 2018-10-10: qty 10

## 2018-10-10 MED ORDER — HEPARIN SOD (PORK) LOCK FLUSH 100 UNIT/ML IV SOLN
500.0000 [IU] | Freq: Once | INTRAVENOUS | Status: AC
  Administered 2018-10-10: 500 [IU] via INTRAVENOUS

## 2018-10-10 MED ORDER — HEPARIN SOD (PORK) LOCK FLUSH 100 UNIT/ML IV SOLN
INTRAVENOUS | Status: AC
  Administered 2018-10-10: 500 [IU] via INTRAVENOUS
  Filled 2018-10-10: qty 5

## 2018-10-12 ENCOUNTER — Inpatient Hospital Stay (HOSPITAL_BASED_OUTPATIENT_CLINIC_OR_DEPARTMENT_OTHER): Payer: Medicare Other | Admitting: Gynecologic Oncology

## 2018-10-12 ENCOUNTER — Other Ambulatory Visit: Payer: Self-pay

## 2018-10-12 ENCOUNTER — Encounter: Payer: Self-pay | Admitting: Gynecologic Oncology

## 2018-10-12 VITALS — BP 123/81 | HR 114 | Temp 99.1°F | Resp 19 | Ht 66.0 in | Wt 197.2 lb

## 2018-10-12 DIAGNOSIS — C786 Secondary malignant neoplasm of retroperitoneum and peritoneum: Secondary | ICD-10-CM

## 2018-10-12 DIAGNOSIS — Z9221 Personal history of antineoplastic chemotherapy: Secondary | ICD-10-CM | POA: Diagnosis not present

## 2018-10-12 DIAGNOSIS — Z90722 Acquired absence of ovaries, bilateral: Secondary | ICD-10-CM | POA: Diagnosis not present

## 2018-10-12 DIAGNOSIS — C541 Malignant neoplasm of endometrium: Secondary | ICD-10-CM

## 2018-10-12 DIAGNOSIS — C773 Secondary and unspecified malignant neoplasm of axilla and upper limb lymph nodes: Secondary | ICD-10-CM | POA: Diagnosis not present

## 2018-10-12 DIAGNOSIS — Z923 Personal history of irradiation: Secondary | ICD-10-CM | POA: Diagnosis not present

## 2018-10-12 DIAGNOSIS — Z90710 Acquired absence of both cervix and uterus: Secondary | ICD-10-CM

## 2018-10-12 NOTE — Progress Notes (Signed)
Follow-up Note: Gyn-Onc   Rheya Minogue Desmith 68 y.o. female  Chief Complaint  Patient presents with  . 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 Complete clinical response to adjuvant therapy (surgery, chemotherapy and vaginal brachytherapy) completed February, 2020.   Plan:  Continue close observation with 3 monthly surveillance visits. Will consider anti-estrogen therapy if recurrence is detected. Consider repeat CT in December, 2685  HPI: 68 year old Afro-American female seen in consultation at the request of Dr. Lorriane Shire a good regarding management of a newly diagnosed high-grade endometrial carcinoma.  The patient noted some postmenopausal bleeding and was promptly seen by Dr. 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  Patient denies any other pelvic symptoms and denies any GI or GU problems.  On preop labs she was noted to have extreme hyperglycemia, was admitted to hospital and was placed on an insulin regimen. She has done well since that time.  Preop imaging with a CT abd/pelvis showed a concerning area in the peripheral liver, T12 and axillary node enlargement.  This was followed by a PET on 01/09/18 which showed no abnormal liver enhancement and no T12 enhancement (suggesting that there was not metastatic disease at these sites), however there was PET avid and enlarged bilateral axillary nodes. There were no other concerning sites for metastatic disease, including no other enlarged lymph nodes.   On 01/23/18 she underwent robotic assisted total hysterectomy, BSO, left SLN biopsy and right pelvic and para-aortic lymphadenectomy. Final pathology confirmed a stage IIIA high grade serous carcinoma with full thickness myometrial involvement, serosal involvement, ovarian involvement  and peritoneal cul de sac implants positive for metastatic carcinoma.   On 02/01/18 she underwent biopsy of the right axillary node which was positive for metastatic carcinoma consistent with her endometrial primary.  She completed 6 cycles of carboplatin and paclitaxel chemtherapy between 02/22/19 and 06/13/18 with minimal toxicity to therapy. She completed 30Gy (5x6Gy) vaginal brachytherapy between December 9th, 2019 and January 13th, 2020.  She was offered maintenance anti-estrogen therapy but declined.  Interval Hx: Repeat CT abd/pelvis on 10/10/18 showed a stable exam from post-treatment imaging, with no apparent recurrent disease. The sclerosis of T12 appeared stable.   She has no ongoing residual toxicities of therapy and is considering returning to work.  Today we discussed survivorship issues including emotional well-being, spirituality, toxicities of chemotherapy and surgery, long term cancer surveillance.    Review of Systems:10 point review of systems is negative except as noted in interval history.   Vitals: Blood pressure 123/81, pulse (!) 114, temperature 99.1 F (37.3 C), temperature source Oral, resp. rate 19, height 5\' 6"  (1.676 m), weight 197 lb 3.2 oz (89.4 kg), SpO2 100 %.  Physical Exam: General : The patient is a healthy woman in no acute distress.  HEENT: normocephalic, extraoccular movements normal; neck is supple without thyromegally  Lynphnodes: Supraclavicular and inguinal nodes not enlarged  Abdomen: Soft, non-tender, no ascites, no organomegally, no masses, no hernias. Well healed incisions.  Pelvic:  Vaginal cuff with no lesions or palpable masses. Bi-manual examination: Non-tender; no adenxal masses or nodularity    Lower extremities: No edema or varicosities. Normal range of motion      Allergies  Allergen Reactions  . Sulfa Antibiotics Nausea Only  . Sulfamethoxazole Nausea Only    Past Medical History:  Diagnosis Date  . Arthritis  right  knee--- last cortisone injection 04/ 2019  . CKD (chronic kidney disease)   . Diabetes mellitus without complication [facility])    endocrinologist-  dr Loanne Drilling  . Endometrial cancer ([place])   . History of colon polyps   . Hyperlipidemia   . Hyperparathyroidism ([place])   . Hypertension     Past Surgical History:  Procedure Laterality Date  . COLONOSCOPY  last one 12-25-2017  . IR IMAGING GUIDED PORT INSERTION  02/20/2018  . ROBOTIC ASSISTED TOTAL HYSTERECTOMY WITH BILATERAL SALPINGO OOPHERECTOMY N/A 01/23/2018   Procedure: XI ROBOTIC ASSISTED TOTAL HYSTERECTOMY WITH BILATERAL SALPINGO OOPHORECTOMY;  Surgeon: Everitt Amber, MD;  Location: [facility];  Service: Gynecology;  Laterality: N/A;  . SENTINEL NODE BIOPSY N/A 01/23/2018   Procedure: SENTINEL NODE BIOPSY;  Surgeon: Everitt Amber, MD;  Location: [facility];  Service: Gynecology;  Laterality: N/A;    Current Outpatient Medications  Medication Sig Dispense Refill  . Insulin Isophane & Regular Human (NOVOLIN 70/30 FLEXPEN) (70-30) 100 UNIT/ML PEN Inject 20 Units into the skin daily with breakfast. also pen needles 1/day 15 mL 11  . ketoconazole (NIZORAL) 2 % cream Apply to web space twice a day for 1 week then daily for 1 month (Patient taking differently: Apply 1 application topically daily as needed for irritation. Apply to web space twice a day for 1 week then daily for 1 month) 15 g 0  . lidocaine-prilocaine (EMLA) cream Apply to affected area once 30 g 3  . lisinopril-hydrochlorothiazide (PRINZIDE,ZESTORETIC) 20-12.5 MG tablet Take 1 tablet by mouth daily. (Patient taking differently: Take 1 tablet by mouth every morning. ) 30 tablet 11  . OneTouch Delica Lancets 69I MISC 1 each by Other route 2 (two) times daily. Use to monitor glucose levels BID; E11.65 100 each 2  . ONETOUCH VERIO test strip 3 (three) times daily. as directed  5  . RELION PEN NEEDLES 32G X 4 MM MISC See admin instructions.  11  . senna-docusate (SENOKOT-S) 8.6-50 MG tablet Take 1  tablet by mouth at bedtime as needed for mild constipation or moderate constipation. 20 tablet 0  . simvastatin (ZOCOR) 10 MG tablet Take 10 mg by mouth at bedtime.     . Vitamin D, Ergocalciferol, 2000 units CAPS Take 2,000 Units by mouth daily.   0   No current facility-administered medications for this visit.     Social History   Socioeconomic History  . Marital status: Single    Spouse name: Not on file  . Number of children: 0  . Years of education: Not on file  . Highest education level: Not on file  Occupational History  . Occupation: retired Education officer, museum  Social Needs  . Financial resource strain: Not on file  . Food insecurity    Worry: Not on file    Inability: Not on file  . Transportation needs    Medical: Not on file    Non-medical: Not on file  Tobacco Use  . Smoking status: Never Smoker  . Smokeless tobacco: Never Used  Substance and Sexual Activity  . Alcohol use: Not Currently    Alcohol/week: 0.0 standard drinks    Frequency: Never  . Drug use: Never  . Sexual activity: Not on file  Lifestyle  . Physical activity    Days per week: Not on file    Minutes per session: Not on file  . Stress: Not on file  Relationships  . Social connections    Talks on phone: Not on  file    Gets together: Not on file    Attends religious service: Not on file    Active member of club or organization: Not on file    Attends meetings of clubs or organizations: Not on file    Relationship status: Not on file  . Intimate partner violence    Fear of current or ex partner: Not on file    Emotionally abused: Not on file    Physically abused: Not on file    Forced sexual activity: Not on file  Other Topics Concern  . Not on file  Social History Narrative  . Not on file    Family History  Problem Relation Age of Onset  . Diabetes Mother   . Hypertension Mother   . Diabetes Sister   . Hypertension Sister   . Diabetes Maternal Uncle   . Colon cancer Paternal Aunt    . Diabetes Paternal Aunt   . Stomach cancer Neg Hx   . Rectal cancer Neg Hx   . Esophageal cancer Neg Hx   . Colon polyps Neg Hx     Thereasa Solo, MD 10/12/2018, 11:43 AM

## 2018-10-12 NOTE — Patient Instructions (Signed)
Please notify Dr Denman George at phone number [phone] if you notice vaginal bleeding, new pelvic or abdominal pains, bloating, feeling full easy, or a change in bladder or bowel function.   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  Please return to see Dr Denman George in September, 2020.

## 2018-10-16 ENCOUNTER — Ambulatory Visit: Payer: Medicare Other | Admitting: Endocrinology

## 2018-10-22 ENCOUNTER — Ambulatory Visit: Payer: Medicare Other | Admitting: Radiation Oncology

## 2018-11-06 ENCOUNTER — Telehealth: Payer: Self-pay

## 2018-11-06 NOTE — Telephone Encounter (Signed)
Pt called to ask if ok to have her teeth cleaned. Advised pt that since she completed chemo n February and is not on any immunosuppressive medications she may have her teeth cleaned.

## 2018-11-09 ENCOUNTER — Other Ambulatory Visit: Payer: Self-pay | Admitting: Endocrinology

## 2018-11-09 MED ORDER — ONETOUCH VERIO VI STRP: ORAL_STRIP | 3 refills | Status: DC

## 2018-11-12 ENCOUNTER — Inpatient Hospital Stay: Payer: Medicare Other | Attending: Gynecology

## 2018-11-12 ENCOUNTER — Other Ambulatory Visit: Payer: Self-pay

## 2018-11-12 DIAGNOSIS — Z90722 Acquired absence of ovaries, bilateral: Secondary | ICD-10-CM | POA: Diagnosis not present

## 2018-11-12 DIAGNOSIS — Z452 Encounter for adjustment and management of vascular access device: Secondary | ICD-10-CM | POA: Insufficient documentation

## 2018-11-12 DIAGNOSIS — C786 Secondary malignant neoplasm of retroperitoneum and peritoneum: Secondary | ICD-10-CM | POA: Diagnosis not present

## 2018-11-12 DIAGNOSIS — Z923 Personal history of irradiation: Secondary | ICD-10-CM | POA: Insufficient documentation

## 2018-11-12 DIAGNOSIS — Z9221 Personal history of antineoplastic chemotherapy: Secondary | ICD-10-CM | POA: Insufficient documentation

## 2018-11-12 DIAGNOSIS — C541 Malignant neoplasm of endometrium: Secondary | ICD-10-CM | POA: Diagnosis not present

## 2018-11-12 DIAGNOSIS — C773 Secondary and unspecified malignant neoplasm of axilla and upper limb lymph nodes: Secondary | ICD-10-CM

## 2018-11-12 DIAGNOSIS — Z90710 Acquired absence of both cervix and uterus: Secondary | ICD-10-CM | POA: Insufficient documentation

## 2018-11-12 MED ORDER — HEPARIN SOD (PORK) LOCK FLUSH 100 UNIT/ML IV SOLN
250.0000 [IU] | Freq: Once | INTRAVENOUS | Status: AC
  Administered 2018-11-12: 500 [IU]
  Filled 2018-11-12: qty 5

## 2018-11-12 MED ORDER — SODIUM CHLORIDE 0.9% FLUSH
10.0000 mL | Freq: Once | INTRAVENOUS | Status: AC
  Administered 2018-11-12: 10 mL
  Filled 2018-11-12: qty 10

## 2018-12-03 DIAGNOSIS — C799 Secondary malignant neoplasm of unspecified site: Secondary | ICD-10-CM | POA: Diagnosis not present

## 2018-12-03 DIAGNOSIS — E559 Vitamin D deficiency, unspecified: Secondary | ICD-10-CM | POA: Diagnosis not present

## 2018-12-03 DIAGNOSIS — E785 Hyperlipidemia, unspecified: Secondary | ICD-10-CM | POA: Diagnosis not present

## 2018-12-03 DIAGNOSIS — C541 Malignant neoplasm of endometrium: Secondary | ICD-10-CM | POA: Diagnosis not present

## 2018-12-03 DIAGNOSIS — Z794 Long term (current) use of insulin: Secondary | ICD-10-CM | POA: Diagnosis not present

## 2018-12-03 DIAGNOSIS — E1122 Type 2 diabetes mellitus with diabetic chronic kidney disease: Secondary | ICD-10-CM | POA: Diagnosis not present

## 2018-12-03 DIAGNOSIS — D61818 Other pancytopenia: Secondary | ICD-10-CM | POA: Diagnosis not present

## 2018-12-03 DIAGNOSIS — Z79899 Other long term (current) drug therapy: Secondary | ICD-10-CM | POA: Diagnosis not present

## 2018-12-03 DIAGNOSIS — I700 Atherosclerosis of aorta: Secondary | ICD-10-CM | POA: Diagnosis not present

## 2018-12-03 DIAGNOSIS — Z0000 Encounter for general adult medical examination without abnormal findings: Secondary | ICD-10-CM | POA: Diagnosis not present

## 2018-12-03 DIAGNOSIS — I10 Essential (primary) hypertension: Secondary | ICD-10-CM | POA: Diagnosis not present

## 2018-12-03 DIAGNOSIS — E210 Primary hyperparathyroidism: Secondary | ICD-10-CM | POA: Diagnosis not present

## 2018-12-04 IMAGING — US IR BIOPSY CORE MUSCLE/SOFT TISSUE
1 series · 13 of 25 positions shown · non-contrast
Comparison: none

INDICATION: 67-year-old with endometrial cancer and suspicious bilateral
axillary lymphadenopathy.

[Series 1: ir biopsy core muscle/soft tissue · 13 of 27 slices shown]
[im 1/27]
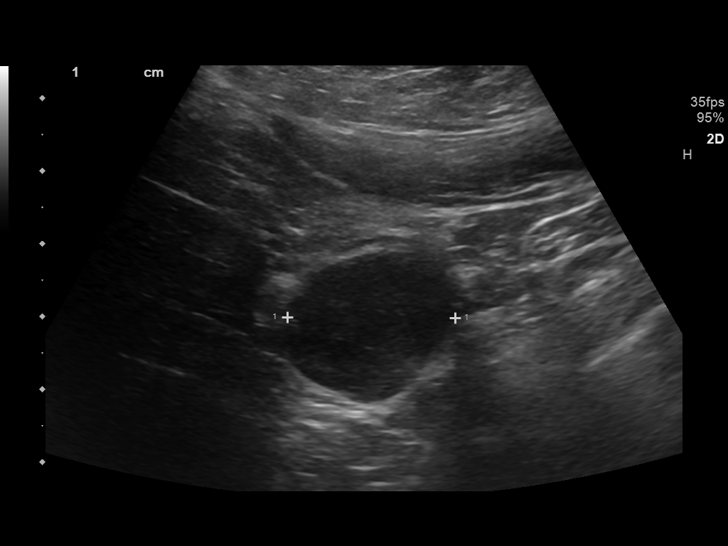
[im 3/27]
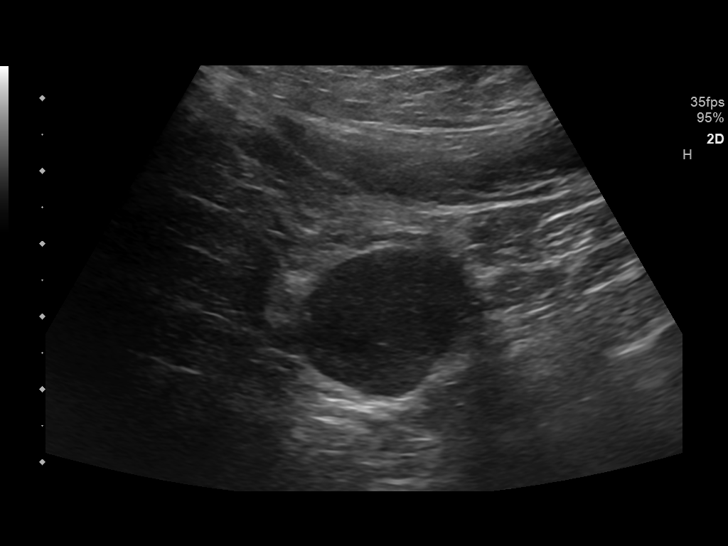
[im 5/27]
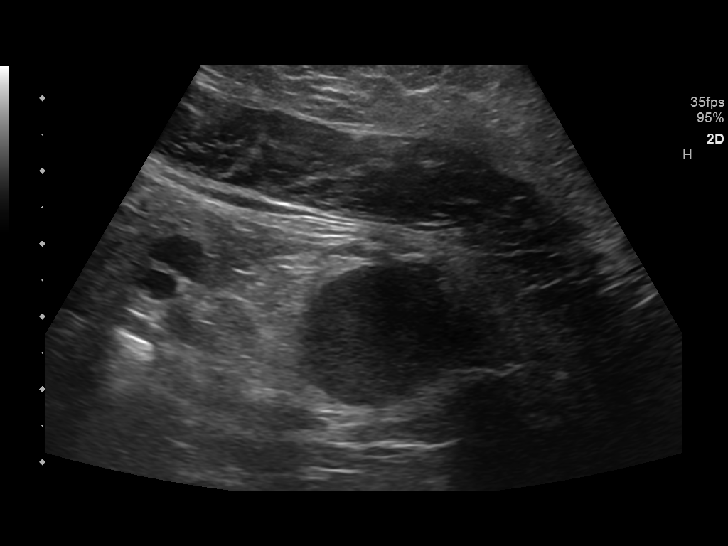
[im 7/27]
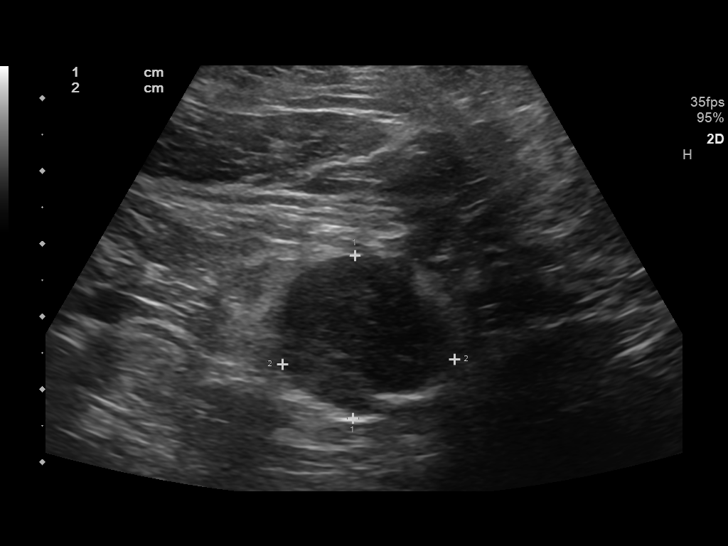
[im 9/27]
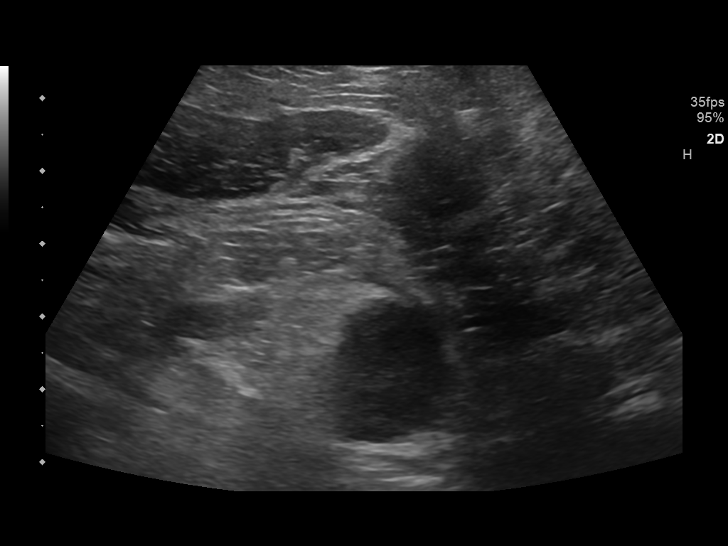
[im 11/27]
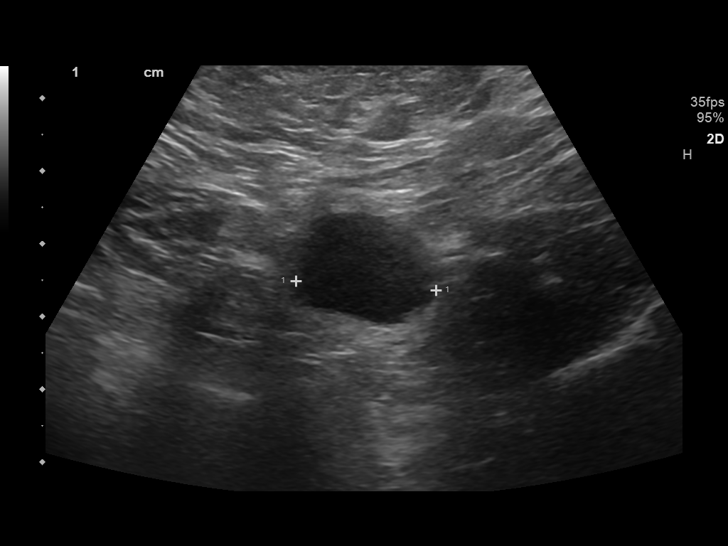
[im 14/27]
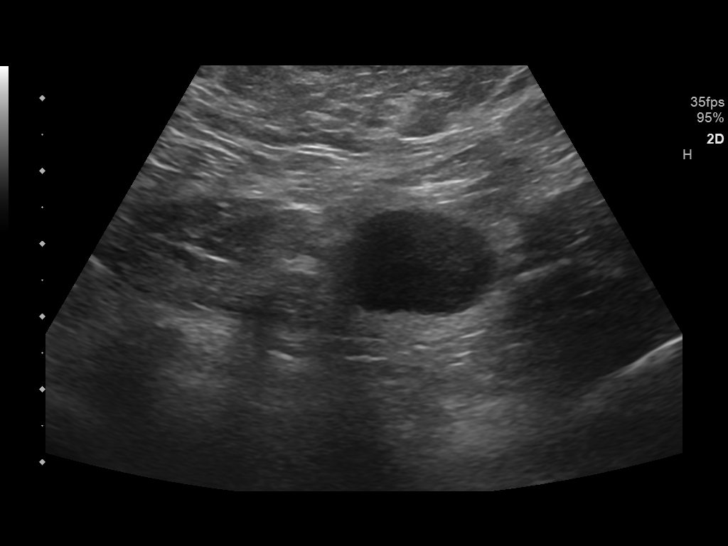
[im 16/27]
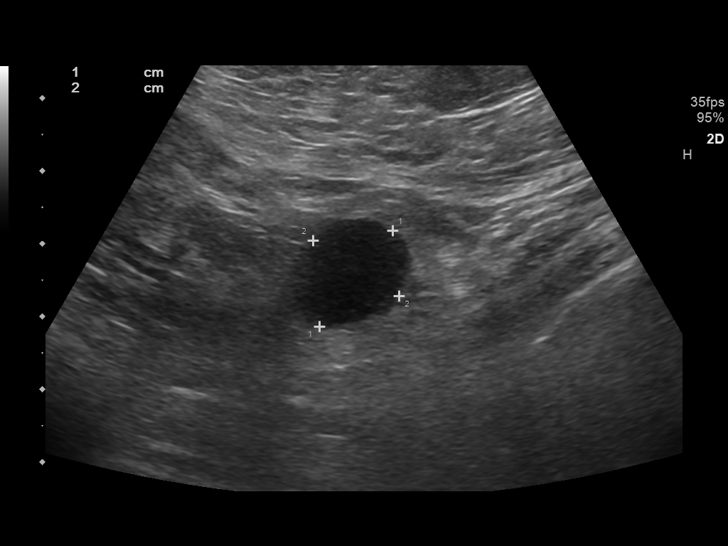
[im 18/27]
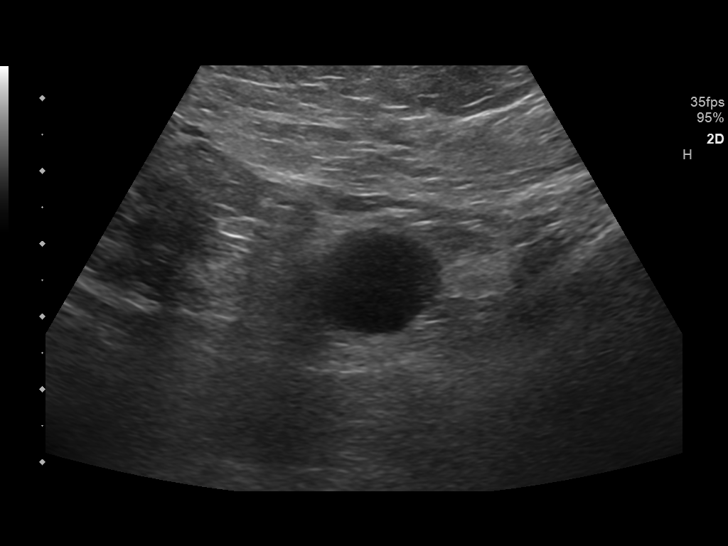
[im 20/27]
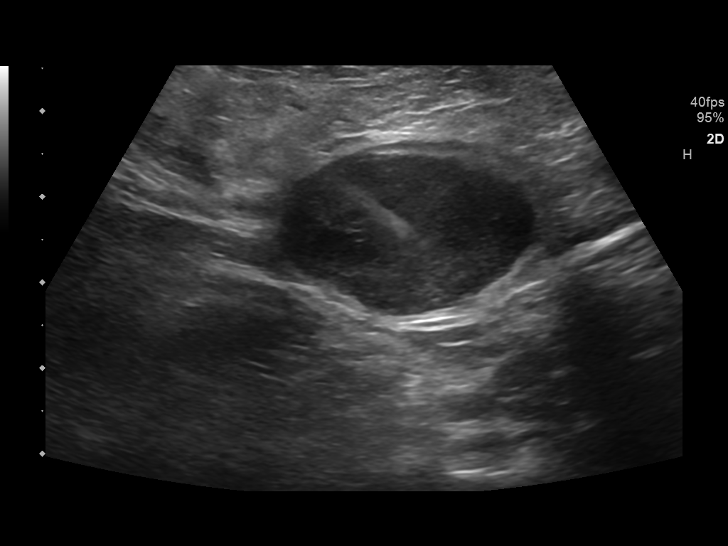
[im 22/27]
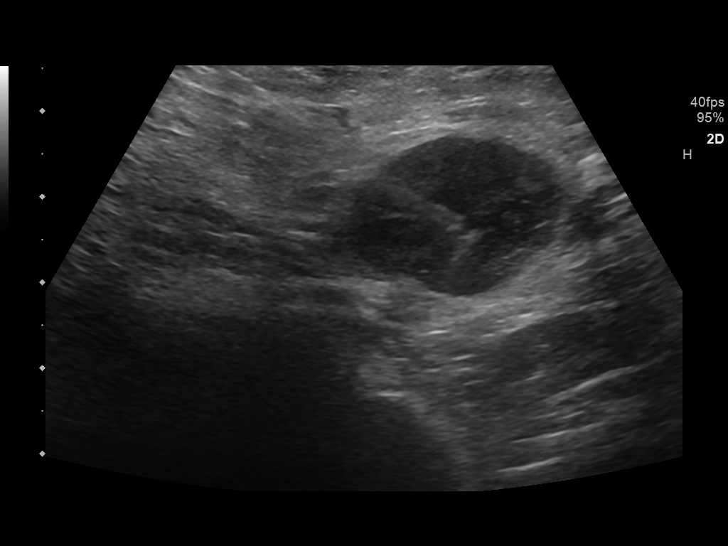
[im 24/27]
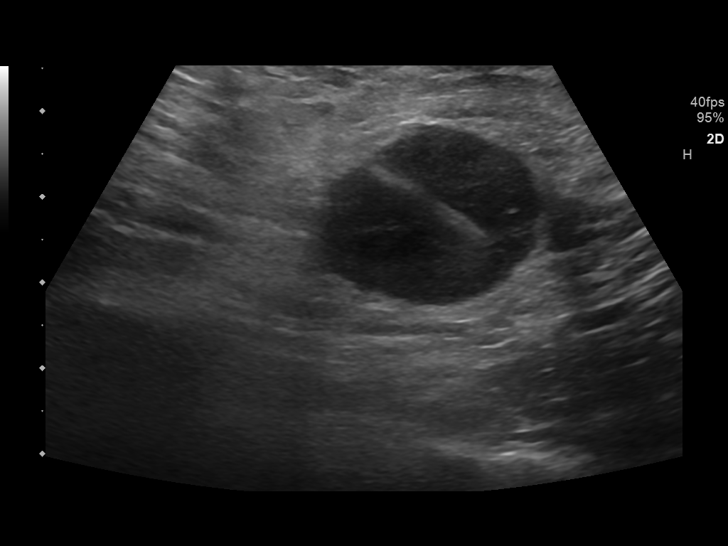
[im 27/27]
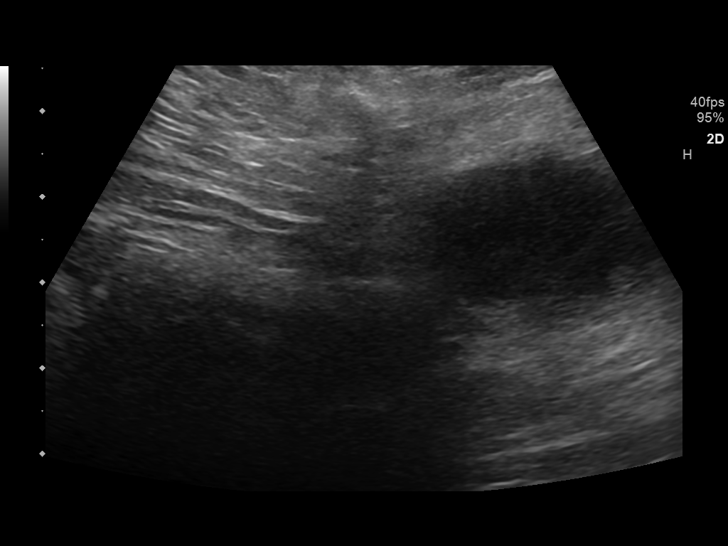

[13 of 25 positions shown; findings below may reference images not displayed]

EXAM:
RIGHT AXILLARY LYMPH NODE BIOPSY WITH ULTRASOUND GUIDANCE

MEDICATIONS:
None.

ANESTHESIA/SEDATION:
Moderate (conscious) sedation was employed during this procedure. A
total of Versed 2.0 mg and Fentanyl 100 mcg was administered
intravenously.

Moderate Sedation Time: 22 minutes. The patient's level of
consciousness and vital signs were monitored continuously by
radiology nursing throughout the procedure under my direct
supervision.

FLUOROSCOPY TIME:  None

COMPLICATIONS:
None immediate.

PROCEDURE:
Informed written consent was obtained from the patient after a
thorough discussion of the procedural risks, benefits and
alternatives. All questions were addressed. A timeout was performed
prior to the initiation of the procedure.

The axillary regions were evaluated with ultrasound. The right
axillary lymph node was selected for biopsy. The right axillary
region was prepped with chlorhexidine and sterile field was created.
Skin and soft tissues were anesthetized with 1% lidocaine. 18 gauge
core biopsy needle was directed into the right axillary lymph node
with real-time ultrasound guidance. A total of 6 core biopsies were
obtained. Specimens placed in saline. Bandage placed over the
puncture site.
FINDINGS: Round hypoechoic lymph node in the right axilla measuring 2.3 x
x 2.4 cm. No significant bleeding or hematoma formation following
the core biopsies.

Round hypoechoic left axillary lymph node measures 1.7 x 1.4 x
cm.
IMPRESSION: Ultrasound-guided core biopsies of a suspicious right axillary lymph
node.

## 2018-12-18 ENCOUNTER — Other Ambulatory Visit: Payer: Self-pay

## 2018-12-18 ENCOUNTER — Encounter: Payer: Self-pay | Admitting: Podiatry

## 2018-12-18 ENCOUNTER — Ambulatory Visit (INDEPENDENT_AMBULATORY_CARE_PROVIDER_SITE_OTHER): Payer: Medicare Other | Admitting: Podiatry

## 2018-12-18 DIAGNOSIS — M79674 Pain in right toe(s): Secondary | ICD-10-CM | POA: Diagnosis not present

## 2018-12-18 DIAGNOSIS — G620 Drug-induced polyneuropathy: Secondary | ICD-10-CM | POA: Diagnosis not present

## 2018-12-18 DIAGNOSIS — E119 Type 2 diabetes mellitus without complications: Secondary | ICD-10-CM | POA: Diagnosis not present

## 2018-12-18 DIAGNOSIS — B351 Tinea unguium: Secondary | ICD-10-CM

## 2018-12-18 DIAGNOSIS — M79675 Pain in left toe(s): Secondary | ICD-10-CM | POA: Diagnosis not present

## 2018-12-18 DIAGNOSIS — T451X5A Adverse effect of antineoplastic and immunosuppressive drugs, initial encounter: Secondary | ICD-10-CM

## 2018-12-18 DIAGNOSIS — Z794 Long term (current) use of insulin: Secondary | ICD-10-CM

## 2018-12-18 NOTE — Patient Instructions (Addendum)
Diabetes Mellitus and Foot Care Foot care is an important part of your health, especially when you have diabetes. Diabetes may cause you to have problems because of poor blood flow (circulation) to your feet and legs, which can cause your skin to:  Become thinner and drier.  Break more easily.  Heal more slowly.  Peel and crack. You may also have nerve damage (neuropathy) in your legs and feet, causing decreased feeling in them. This means that you may not notice minor injuries to your feet that could lead to more serious problems. Noticing and addressing any potential problems early is the best way to prevent future foot problems. How to care for your feet Foot hygiene  Wash your feet daily with warm water and mild soap. Do not use hot water. Then, pat your feet and the areas between your toes until they are completely dry. Do not soak your feet as this can dry your skin.  Trim your toenails straight across. Do not dig under them or around the cuticle. File the edges of your nails with an emery board or nail file.  Apply a moisturizing lotion or petroleum jelly to the skin on your feet and to dry, brittle toenails. Use lotion that does not contain alcohol and is unscented. Do not apply lotion between your toes. Shoes and socks  Wear clean socks or stockings every day. Make sure they are not too tight. Do not wear knee-high stockings since they may decrease blood flow to your legs.  Wear shoes that fit properly and have enough cushioning. Always look in your shoes before you put them on to be sure there are no objects inside.  To break in new shoes, wear them for just a few hours a day. This prevents injuries on your feet. Wounds, scrapes, corns, and calluses  Check your feet daily for blisters, cuts, bruises, sores, and redness. If you cannot see the bottom of your feet, use a mirror or ask someone for help.  Do not cut corns or calluses or try to remove them with medicine.  If you  find a minor scrape, cut, or break in the skin on your feet, keep it and the skin around it clean and dry. You may clean these areas with mild soap and water. Do not clean the area with peroxide, alcohol, or iodine.  If you have a wound, scrape, corn, or callus on your foot, look at it several times a day to make sure it is healing and not infected. Check for: ? Redness, swelling, or pain. ? Fluid or blood. ? Warmth. ? Pus or a bad smell. General instructions  Do not cross your legs. This may decrease blood flow to your feet.  Do not use heating pads or hot water bottles on your feet. They may burn your skin. If you have lost feeling in your feet or legs, you may not know this is happening until it is too late.  Protect your feet from hot and cold by wearing shoes, such as at the beach or on hot pavement.  Schedule a complete foot exam at least once a year (annually) or more often if you have foot problems. If you have foot problems, report any cuts, sores, or bruises to your health care provider immediately. Contact a health care provider if:  You have a medical condition that increases your risk of infection and you have any cuts, sores, or bruises on your feet.  You have an injury that is not   healing.  You have redness on your legs or feet.  You feel burning or tingling in your legs or feet.  You have pain or cramps in your legs and feet.  Your legs or feet are numb.  Your feet always feel cold.  You have pain around a toenail. Get help right away if:  You have a wound, scrape, corn, or callus on your foot and: ? You have pain, swelling, or redness that gets worse. ? You have fluid or blood coming from the wound, scrape, corn, or callus. ? Your wound, scrape, corn, or callus feels warm to the touch. ? You have pus or a bad smell coming from the wound, scrape, corn, or callus. ? You have a fever. ? You have a red line going up your leg. Summary  Check your feet every day  for cuts, sores, red spots, swelling, and blisters.  Moisturize feet and legs daily.  Wear shoes that fit properly and have enough cushioning.  If you have foot problems, report any cuts, sores, or bruises to your health care provider immediately.  Schedule a complete foot exam at least once a year (annually) or more often if you have foot problems. This information is not intended to replace advice given to you by your health care provider. Make sure you discuss any questions you have with your health care provider. Document Released: 04/01/2000 Document Revised: 05/17/2017 Document Reviewed: 05/06/2016 Elsevier Patient Education  2020 [employer]   Onychomycosis/Fungal Toenails  WHAT IS IT? An infection that lies within the keratin of your nail plate that is caused by a fungus.  WHY ME? Fungal infections affect all ages, sexes, races, and creeds.  There may be many factors that predispose you to a fungal infection such as age, coexisting medical conditions such as diabetes, or an autoimmune disease; stress, medications, fatigue, genetics, etc.  Bottom line: fungus thrives in a warm, moist environment and your shoes offer such a location.  IS IT CONTAGIOUS? Theoretically, yes.  You do not want to share shoes, nail clippers or files with someone who has fungal toenails.  Walking around barefoot in the same room or sleeping in the same bed is unlikely to transfer the organism.  It is important to realize, however, that fungus can spread easily from one nail to the next on the same foot.  HOW DO WE TREAT THIS?  There are several ways to treat this condition.  Treatment may depend on many factors such as age, medications, pregnancy, liver and kidney conditions, etc.  It is best to ask your doctor which options are available to you.  1. No treatment.   Unlike many other medical concerns, you can live with this condition.  However for many people this can be a painful condition and may lead to  ingrown toenails or a bacterial infection.  It is recommended that you keep the nails cut short to help reduce the amount of fungal nail. 2. Topical treatment.  These range from herbal remedies to prescription strength nail lacquers.  About 40-50% effective, topicals require twice daily application for approximately 9 to 12 months or until an entirely new nail has grown out.  The most effective topicals are medical grade medications available through physicians offices. 3. Oral antifungal medications.  With an 80-90% cure rate, the most common oral medication requires 3 to 4 months of therapy and stays in your system for a year as the new nail grows out.  Oral antifungal medications do require   blood work to make sure it is a safe drug for you.  A liver function panel will be performed prior to starting the medication and after the first month of treatment.  It is important to have the blood work performed to avoid any harmful side effects.  In general, this medication safe but blood work is required. 4. Laser Therapy.  This treatment is performed by applying a specialized laser to the affected nail plate.  This therapy is noninvasive, fast, and non-painful.  It is not covered by insurance and is therefore, out of pocket.  The results have been very good with a 80-95% cure rate.  [facility] is the only practice in the area to offer this therapy. 5. Permanent Nail Avulsion.  Removing the entire nail so that a new nail will not grow back. 

## 2018-12-25 ENCOUNTER — Other Ambulatory Visit: Payer: Self-pay

## 2018-12-25 ENCOUNTER — Inpatient Hospital Stay: Payer: Medicare Other | Attending: Gynecology

## 2018-12-25 DIAGNOSIS — Z79899 Other long term (current) drug therapy: Secondary | ICD-10-CM | POA: Diagnosis not present

## 2018-12-25 DIAGNOSIS — C786 Secondary malignant neoplasm of retroperitoneum and peritoneum: Secondary | ICD-10-CM | POA: Insufficient documentation

## 2018-12-25 DIAGNOSIS — R590 Localized enlarged lymph nodes: Secondary | ICD-10-CM | POA: Insufficient documentation

## 2018-12-25 DIAGNOSIS — C541 Malignant neoplasm of endometrium: Secondary | ICD-10-CM | POA: Insufficient documentation

## 2018-12-25 DIAGNOSIS — E119 Type 2 diabetes mellitus without complications: Secondary | ICD-10-CM | POA: Diagnosis not present

## 2018-12-25 DIAGNOSIS — Z9221 Personal history of antineoplastic chemotherapy: Secondary | ICD-10-CM | POA: Insufficient documentation

## 2018-12-25 DIAGNOSIS — M1990 Unspecified osteoarthritis, unspecified site: Secondary | ICD-10-CM | POA: Insufficient documentation

## 2018-12-25 DIAGNOSIS — Z794 Long term (current) use of insulin: Secondary | ICD-10-CM | POA: Diagnosis not present

## 2018-12-25 DIAGNOSIS — Z90722 Acquired absence of ovaries, bilateral: Secondary | ICD-10-CM | POA: Insufficient documentation

## 2018-12-25 DIAGNOSIS — E1122 Type 2 diabetes mellitus with diabetic chronic kidney disease: Secondary | ICD-10-CM | POA: Insufficient documentation

## 2018-12-25 DIAGNOSIS — E785 Hyperlipidemia, unspecified: Secondary | ICD-10-CM | POA: Diagnosis not present

## 2018-12-25 DIAGNOSIS — C773 Secondary and unspecified malignant neoplasm of axilla and upper limb lymph nodes: Secondary | ICD-10-CM | POA: Diagnosis not present

## 2018-12-25 DIAGNOSIS — Z90710 Acquired absence of both cervix and uterus: Secondary | ICD-10-CM | POA: Insufficient documentation

## 2018-12-25 DIAGNOSIS — E213 Hyperparathyroidism, unspecified: Secondary | ICD-10-CM | POA: Diagnosis not present

## 2018-12-25 DIAGNOSIS — N189 Chronic kidney disease, unspecified: Secondary | ICD-10-CM | POA: Diagnosis not present

## 2018-12-25 DIAGNOSIS — I129 Hypertensive chronic kidney disease with stage 1 through stage 4 chronic kidney disease, or unspecified chronic kidney disease: Secondary | ICD-10-CM | POA: Diagnosis not present

## 2018-12-25 DIAGNOSIS — Z923 Personal history of irradiation: Secondary | ICD-10-CM | POA: Diagnosis not present

## 2018-12-25 MED ORDER — SODIUM CHLORIDE 0.9% FLUSH
10.0000 mL | Freq: Once | INTRAVENOUS | Status: AC
  Administered 2018-12-25: 10:00:00 10 mL
  Filled 2018-12-25: qty 10

## 2018-12-25 MED ORDER — HEPARIN SOD (PORK) LOCK FLUSH 100 UNIT/ML IV SOLN
500.0000 [IU] | Freq: Once | INTRAVENOUS | Status: AC
  Administered 2018-12-25: 500 [IU]
  Filled 2018-12-25: qty 5

## 2018-12-25 NOTE — Progress Notes (Signed)
Subjective: Krista Johnson presents today with history of chemotherapy induced neuropathy. She is also diabetic. Patient seen for follow up of chronic, painful mycotic toenails  which interfere with daily activities and routine tasks.  Pain is aggravated when wearing enclosed shoe gear. Pain is getting progressively worse and relieved with periodic professional debridement.   Jonathon Jordan, MD is her PCP. She sees Dr. Loanne Drilling for management of her diabetes. Last visit was 09/20/2018.   Current Outpatient Medications:  .  Insulin Isophane & Regular Human (NOVOLIN 70/30 FLEXPEN) (70-30) 100 UNIT/ML PEN, Inject 20 Units into the skin daily with breakfast. also pen needles 1/day, Disp: 15 mL, Rfl: 11 .  ketoconazole (NIZORAL) 2 % cream, Apply to web space twice a day for 1 week then daily for 1 month (Patient taking differently: Apply 1 application topically daily as needed for irritation. Apply to web space twice a day for 1 week then daily for 1 month), Disp: 15 g, Rfl: 0 .  lidocaine-prilocaine (EMLA) cream, Apply to affected area once, Disp: 30 g, Rfl: 3 .  lisinopril-hydrochlorothiazide (PRINZIDE,ZESTORETIC) 20-12.5 MG tablet, Take 1 tablet by mouth daily. (Patient taking differently: Take 1 tablet by mouth every morning. ), Disp: 30 tablet, Rfl: 11 .  OneTouch Delica Lancets 99991111 MISC, 1 each by Other route 2 (two) times daily. Use to monitor glucose levels BID; E11.65, Disp: 100 each, Rfl: 2 .  ONETOUCH VERIO test strip, Use to check blood sugar TID, Disp: 100 each, Rfl: 3 .  RELION PEN NEEDLES 32G X 4 MM MISC, See admin instructions., Disp: , Rfl: 11 .  simvastatin (ZOCOR) 10 MG tablet, Take 10 mg by mouth at bedtime. , Disp: , Rfl:  .  Vitamin D, Ergocalciferol, 2000 units CAPS, Take 2,000 Units by mouth daily. , Disp: , Rfl: 0  Allergies  Allergen Reactions  . Sulfa Antibiotics Nausea Only  . Sulfamethoxazole Nausea Only    Objective:  Vascular Examination: Capillary refill time  immediate x 10 digits.  Dorsalis pedis pulses present b/l.  Posterior tibial pulses present b/l.  Decreased digital hair x 10 digits.  Skin temperature WNL b/l.  Dermatological Examination: Skin with normal turgor, texture and tone b/l.  Toenails 1-5 b/l discolored, thick, dystrophic with subungual debris and pain with palpation to nailbeds due to thickness of nails.  Musculoskeletal: Muscle strength 5/5 b/l to all LE muscle groups.  Neurological: Sensation intact 5/5 b/l with 10 gram monofilament.  Vibratory sensation diminished b/l.  Assessment: 1. Painful onychomycosis toenails 1-5 b/l 2. Chemotherapy induced neuropathy 3. 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 Toenails 1-5 b/l were debrided in length and girth without iatrogenic bleeding. 2. Patient to continue soft, supportive shoe gear daily. 3. Patient to report any pedal injuries to medical professional immediately. 4. Follow up 3 months.  5. Patient/POA to call should there be a concern in the interim.

## 2018-12-27 ENCOUNTER — Other Ambulatory Visit: Payer: Self-pay

## 2018-12-28 ENCOUNTER — Other Ambulatory Visit: Payer: Self-pay | Admitting: Endocrinology

## 2018-12-28 NOTE — Telephone Encounter (Signed)
Pr Dr. Cordelia Pen request, I am forwarding this refill request. Please review and refill if appropriate

## 2018-12-28 NOTE — Telephone Encounter (Signed)
Please forward refill request to pt's primary care provider.   

## 2018-12-28 NOTE — Telephone Encounter (Signed)
Would you like to refill or refer to PCP for BP management?

## 2018-12-31 ENCOUNTER — Other Ambulatory Visit: Payer: Self-pay

## 2018-12-31 ENCOUNTER — Ambulatory Visit (INDEPENDENT_AMBULATORY_CARE_PROVIDER_SITE_OTHER): Payer: Medicare Other | Admitting: Endocrinology

## 2018-12-31 ENCOUNTER — Encounter: Payer: Self-pay | Admitting: Endocrinology

## 2018-12-31 VITALS — BP 130/82 | HR 116 | Ht 66.0 in | Wt 202.2 lb

## 2018-12-31 DIAGNOSIS — Z794 Long term (current) use of insulin: Secondary | ICD-10-CM

## 2018-12-31 DIAGNOSIS — E1122 Type 2 diabetes mellitus with diabetic chronic kidney disease: Secondary | ICD-10-CM | POA: Diagnosis not present

## 2018-12-31 DIAGNOSIS — N183 Chronic kidney disease, stage 3 unspecified: Secondary | ICD-10-CM

## 2018-12-31 DIAGNOSIS — E1165 Type 2 diabetes mellitus with hyperglycemia: Secondary | ICD-10-CM | POA: Diagnosis not present

## 2018-12-31 DIAGNOSIS — IMO0001 Reserved for inherently not codable concepts without codable children: Secondary | ICD-10-CM

## 2018-12-31 LAB — POCT GLYCOSYLATED HEMOGLOBIN (HGB A1C): Hemoglobin A1C: 7.9 % — AB (ref 4.0–5.6)

## 2018-12-31 LAB — VITAMIN D 25 HYDROXY (VIT D DEFICIENCY, FRACTURES): VITD: 38.49 ng/mL (ref 30.00–100.00)

## 2018-12-31 LAB — TSH: TSH: 0.69 u[IU]/mL (ref 0.35–4.50)

## 2018-12-31 NOTE — Patient Instructions (Addendum)
Please continue the same insulin.   When you get your next steroid shot, call if the blood sugar goes over 200.   check your blood sugar twice a day.  vary the time of day when you check, between before the 3 meals, and at bedtime.  also check if you have symptoms of your blood sugar being too high or too low.  please keep a record of the readings and bring it to your next appointment here (or you can bring the meter itself).  You can write it on any piece of paper.  please call us sooner if your blood sugar goes below 70, or if you have a lot of readings over 200. On this type of insulin schedule, you should eat meals on a regular schedule (especially lunch).  If a meal is missed or significantly delayed, your blood sugar could go low.   Please come back for a follow-up appointment in 2-3 months.

## 2018-12-31 NOTE — Progress Notes (Signed)
Subjective:    Patient ID: Krista Johnson, female    DOB: 1951-04-06, 68 y.o.   MRN: [id number]  HPI Pt returns for f/u of diabetes mellitus:  DM type: Insulin-requiring type 2.   Dx'ed: [id number] Complications: renal insuff Therapy: insulin since mid-2019 GDM: never (G0) DKA: never Severe hypoglycemia: never Pancreatitis: never Pancreatic imaging: normal on 2019 CT.   Other: she takes qd insulin, after poor results with multiple daily injections; She is finished with her chemo. Interval history:she brings her meter with her cbg's which I have reviewed today.  cbg varies from 66-210.  It is still in general lowest in the afternoon.  Pt says she seldom misses the insulin.  Pt also has vit-D def and Hyperparathyroidism (prob a combination of primary and secondary).  She takes vit-D, 2000 units per day.    No recent steroids.   Past Medical History:  Diagnosis Date  . Arthritis    right knee--- last cortisone injection 04/ 2019  . CKD (chronic kidney disease)   . Diabetes mellitus without complication [facility])    endocrinologist-  dr Loanne Drilling  . Endometrial cancer ([place])   . History of colon polyps   . Hyperlipidemia   . Hyperparathyroidism ([place])   . Hypertension     Past Surgical History:  Procedure Laterality Date  . COLONOSCOPY  last one 12-25-2017  . IR IMAGING GUIDED PORT INSERTION  02/20/2018  . ROBOTIC ASSISTED TOTAL HYSTERECTOMY WITH BILATERAL SALPINGO OOPHERECTOMY N/A 01/23/2018   Procedure: XI ROBOTIC ASSISTED TOTAL HYSTERECTOMY WITH BILATERAL SALPINGO OOPHORECTOMY;  Surgeon: Everitt Amber, MD;  Location: [facility];  Service: Gynecology;  Laterality: N/A;  . SENTINEL NODE BIOPSY N/A 01/23/2018   Procedure: SENTINEL NODE BIOPSY;  Surgeon: Everitt Amber, MD;  Location: [facility];  Service: Gynecology;  Laterality: N/A;    Social History   Socioeconomic History  . Marital status: Single    Spouse name: Not on file  . Number of children: 0  . Years of education: Not on file  . Highest  education level: Not on file  Occupational History  . Occupation: retired Education officer, museum  Social Needs  . Financial resource strain: Not on file  . Food insecurity    Worry: Not on file    Inability: Not on file  . Transportation needs    Medical: Not on file    Non-medical: Not on file  Tobacco Use  . Smoking status: Never Smoker  . Smokeless tobacco: Never Used  Substance and Sexual Activity  . Alcohol use: Not Currently    Alcohol/week: 0.0 standard drinks    Frequency: Never  . Drug use: Never  . Sexual activity: Not on file  Lifestyle  . Physical activity    Days per week: Not on file    Minutes per session: Not on file  . Stress: Not on file  Relationships  . Social Herbalist on phone: Not on file    Gets together: Not on file    Attends religious service: Not on file    Active member of club or organization: Not on file    Attends meetings of clubs or organizations: Not on file    Relationship status: Not on file  . Intimate partner violence    Fear of current or ex partner: Not on file    Emotionally abused: Not on file    Physically abused: Not on file    Forced sexual activity: Not on file  Other Topics  Concern  . Not on file  Social History Narrative  . Not on file    Current Outpatient Medications on File Prior to Visit  Medication Sig Dispense Refill  . Insulin Isophane & Regular Human (NOVOLIN 70/30 FLEXPEN) (70-30) 100 UNIT/ML PEN Inject 20 Units into the skin daily with breakfast. also pen needles 1/day 15 mL 11  . ketoconazole (NIZORAL) 2 % cream Apply to web space twice a day for 1 week then daily for 1 month (Patient taking differently: Apply 1 application topically daily as needed for irritation. Apply to web space twice a day for 1 week then daily for 1 month) 15 g 0  . lidocaine-prilocaine (EMLA) cream Apply to affected area once 30 g 3  . lisinopril-hydrochlorothiazide (PRINZIDE,ZESTORETIC) 20-12.5 MG tablet Take 1 tablet by mouth  daily. (Patient taking differently: Take 1 tablet by mouth every morning. ) 30 tablet 11  . OneTouch Delica Lancets 99991111 MISC 1 each by Other route 2 (two) times daily. Use to monitor glucose levels BID; E11.65 100 each 2  . ONETOUCH VERIO test strip Use to check blood sugar TID 100 each 3  . RELION PEN NEEDLES 32G X 4 MM MISC See admin instructions.  11  . simvastatin (ZOCOR) 10 MG tablet Take 10 mg by mouth at bedtime.     . Vitamin D, Ergocalciferol, 2000 units CAPS Take 2,000 Units by mouth daily.   0   No current facility-administered medications on file prior to visit.     Allergies  Allergen Reactions  . Sulfa Antibiotics Nausea Only  . Sulfamethoxazole Nausea Only    Family History  Problem Relation Age of Onset  . Diabetes Mother   . Hypertension Mother   . Diabetes Sister   . Hypertension Sister   . Diabetes Maternal Uncle   . Colon cancer Paternal Aunt   . Diabetes Paternal Aunt   . Stomach cancer Neg Hx   . Rectal cancer Neg Hx   . Esophageal cancer Neg Hx   . Colon polyps Neg Hx     BP 130/82 (BP Location: Left Arm, Patient Position: Sitting, Cuff Size: Normal)   Pulse (!) 116   Ht 5\' 6"  (1.676 m)   Wt 202 lb 3.2 oz (91.7 kg)   SpO2 95%   BMI 32.64 kg/m   Review of Systems Denies LOC.      Objective:   Physical Exam VITAL SIGNS:  See vs page GENERAL: no distress Pulses: dorsalis pedis intact bilat.   MSK: no deformity of the feet.  CV: no leg edema.   Skin:  no ulcer on the feet.  normal color and temp on the feet.  Neuro: sensation is intact to touch on the feet.    Lab Results  Component Value Date   PTH 102 (H) 01/22/2018   CALCIUM 10.2 10/10/2018   CAION 1.28 12/12/2017     Lab Results  Component Value Date   CREATININE 1.46 (H) 10/10/2018   BUN 29 (H) 10/10/2018   NA 140 10/10/2018   K 4.3 10/10/2018   CL 106 10/10/2018   CO2 23 10/10/2018   Lab Results  Component Value Date   HGBA1C 7.9 (A) 12/31/2018       Assessment &  Plan:  Tachycardia: check TSH Insulin-requiring type 2 DM, with renal failure: this is the best control this pt should aim for, given this regimen, which does match insulin to her changing needs throughout the day Hyperparathyroidism: recheck today Vit-D def: recheck  today  Patient Instructions  Please continue the same insulin.   When you get your next steroid shot, call if the blood sugar goes over 200.   check your blood sugar twice a day.  vary the time of day when you check, between before the 3 meals, and at bedtime.  also check if you have symptoms of your blood sugar being too high or too low.  please keep a record of the readings and bring it to your next appointment here (or you can bring the meter itself).  You can write it on any piece of paper.  please call us sooner if your blood sugar goes below 70, or if you have a lot of readings over 200. On this type of insulin schedule, you should eat meals on a regular schedule (especially lunch).  If a meal is missed or significantly delayed, your blood sugar could go low.   Please come back for a follow-up appointment in 2-3 months.

## 2019-01-01 LAB — PTH, INTACT AND CALCIUM
Calcium: 9.9 mg/dL (ref 8.6–10.4)
PTH: 70 pg/mL — ABNORMAL HIGH (ref 14–64)

## 2019-01-07 ENCOUNTER — Inpatient Hospital Stay (HOSPITAL_BASED_OUTPATIENT_CLINIC_OR_DEPARTMENT_OTHER): Payer: Medicare Other | Admitting: Gynecologic Oncology

## 2019-01-07 ENCOUNTER — Other Ambulatory Visit: Payer: Self-pay

## 2019-01-07 ENCOUNTER — Telehealth: Payer: Self-pay | Admitting: Endocrinology

## 2019-01-07 ENCOUNTER — Encounter: Payer: Self-pay | Admitting: Gynecologic Oncology

## 2019-01-07 VITALS — BP 125/79 | HR 110 | Temp 98.0°F | Resp 18 | Ht 66.0 in | Wt 202.8 lb

## 2019-01-07 DIAGNOSIS — Z90710 Acquired absence of both cervix and uterus: Secondary | ICD-10-CM | POA: Diagnosis not present

## 2019-01-07 DIAGNOSIS — C786 Secondary malignant neoplasm of retroperitoneum and peritoneum: Secondary | ICD-10-CM | POA: Diagnosis not present

## 2019-01-07 DIAGNOSIS — C773 Secondary and unspecified malignant neoplasm of axilla and upper limb lymph nodes: Secondary | ICD-10-CM | POA: Diagnosis not present

## 2019-01-07 DIAGNOSIS — C541 Malignant neoplasm of endometrium: Secondary | ICD-10-CM

## 2019-01-07 DIAGNOSIS — Z90722 Acquired absence of ovaries, bilateral: Secondary | ICD-10-CM | POA: Diagnosis not present

## 2019-01-07 DIAGNOSIS — Z9221 Personal history of antineoplastic chemotherapy: Secondary | ICD-10-CM | POA: Diagnosis not present

## 2019-01-07 DIAGNOSIS — R590 Localized enlarged lymph nodes: Secondary | ICD-10-CM | POA: Diagnosis not present

## 2019-01-07 DIAGNOSIS — Z923 Personal history of irradiation: Secondary | ICD-10-CM | POA: Diagnosis not present

## 2019-01-07 NOTE — Telephone Encounter (Signed)
Left detailed VM informing pt of these changes. Advised she call if she had questions or concerns.

## 2019-01-07 NOTE — Telephone Encounter (Signed)
Please increase insulin to 25 units qam, with breakfast

## 2019-01-07 NOTE — Progress Notes (Signed)
Follow-up Note: Gyn-Onc   Krista Johnson 68 y.o. female  Chief Complaint  Patient presents with  . 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 Complete clinical response to adjuvant therapy (surgery, chemotherapy and vaginal brachytherapy) completed February, 2020.  New concerning signs for recurrence in the right axilla and left inguinal regions.  Plan:  PET now. If positive, would consider re-treatment with chemotherapy versus hormonal agent (anti-estrogen) verus clinical trial.  If PET is negative I will see her in 3 months for follow-up.   HPI: 68 year old Afro-American female seen in consultation at the request of Dr. Lorriane Shire a good regarding management of a newly diagnosed high-grade endometrial carcinoma.  The patient noted some postmenopausal bleeding and was promptly seen by Dr. 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  Patient denies any other pelvic symptoms and denies any GI or GU problems.  On preop labs she was noted to have extreme hyperglycemia, was admitted to hospital and was placed on an insulin regimen. She has done well since that time.  Preop imaging with a CT abd/pelvis showed a concerning area in the peripheral liver, T12 and axillary node enlargement.  This was followed by a PET on 01/09/18 which showed no abnormal liver enhancement and no T12 enhancement (suggesting that there was not metastatic disease at these sites), however there was PET avid and enlarged bilateral axillary nodes. There were no other concerning sites for metastatic disease, including no other enlarged lymph nodes.   On 01/23/18 she underwent robotic assisted total hysterectomy, BSO, left SLN biopsy and right pelvic and para-aortic lymphadenectomy. Final pathology confirmed a stage IIIA high grade  serous carcinoma with full thickness myometrial involvement, serosal involvement, ovarian involvement and peritoneal cul de sac implants positive for metastatic carcinoma.   On 02/01/18 she underwent biopsy of the right axillary node which was positive for metastatic carcinoma consistent with her endometrial primary.  She completed 6 cycles of carboplatin and paclitaxel chemtherapy between 02/22/19 and 06/13/18 with minimal toxicity to therapy. She completed 30Gy (5x6Gy) vaginal brachytherapy between December 9th, 2019 and January 13th, 2020.  She was offered maintenance anti-estrogen therapy but declined.  Interval Hx: Repeat CT abd/pelvis on 10/10/18 showed a stable exam from post-treatment imaging, with no apparent recurrent disease. The sclerosis of T12 appeared stable.   The patient reported tripping at work and as she stumbled she grabbed hold of a wall and "jarred my body". After that she noticed pain and swelling in her right axilla and left groin. The discomfort is slightly improved but the swelling persists.   Review of Systems:10 point review of systems is negative except as noted in interval history.   Vitals: Blood pressure 125/79, pulse (!) 110, temperature 98 F (36.7 C), temperature source Oral, resp. rate 18, height 5\' 6"  (1.676 m), weight 202 lb 12.8 oz (92 kg), SpO2 100 %.  Physical Exam: General : The patient is a healthy woman in no acute distress.  HEENT: normocephalic, extraoccular movements normal; neck is supple without thyromegally  Lynphnodes: Supraclavicular nodes not enlarged. There is a fixed right 6cm fleshy mass in the right axilla consistent with lymphadenopathy and a 4cm lymph node thickening in the left groin/inguinal chain. Highly concerning for recurrence.  Abdomen: Soft, non-tender, no ascites, no organomegally, no masses, no hernias. Well healed incisions.  Pelvic:  Vaginal cuff with no lesions or palpable  masses. Bi-manual examination: Non-tender; no  adenxal masses or nodularity    Lower extremities: No edema or varicosities. Normal range of motion      Allergies  Allergen Reactions  . Sulfa Antibiotics Nausea Only  . Sulfamethoxazole Nausea Only    Past Medical History:  Diagnosis Date  . Arthritis    right knee--- last cortisone injection 04/ 2019  . CKD (chronic kidney disease)   . Diabetes mellitus without complication [facility])    endocrinologist-  dr Loanne Drilling  . Endometrial cancer ([place])   . History of colon polyps   . Hyperlipidemia   . Hyperparathyroidism ([place])   . Hypertension     Past Surgical History:  Procedure Laterality Date  . COLONOSCOPY  last one 12-25-2017  . IR IMAGING GUIDED PORT INSERTION  02/20/2018  . ROBOTIC ASSISTED TOTAL HYSTERECTOMY WITH BILATERAL SALPINGO OOPHERECTOMY N/A 01/23/2018   Procedure: XI ROBOTIC ASSISTED TOTAL HYSTERECTOMY WITH BILATERAL SALPINGO OOPHORECTOMY;  Surgeon: Everitt Amber, MD;  Location: [facility];  Service: Gynecology;  Laterality: N/A;  . SENTINEL NODE BIOPSY N/A 01/23/2018   Procedure: SENTINEL NODE BIOPSY;  Surgeon: Everitt Amber, MD;  Location: [facility];  Service: Gynecology;  Laterality: N/A;    Current Outpatient Medications  Medication Sig Dispense Refill  . Insulin Isophane & Regular Human (NOVOLIN 70/30 FLEXPEN) (70-30) 100 UNIT/ML PEN Inject 20 Units into the skin daily with breakfast. also pen needles 1/day 15 mL 11  . ketoconazole (NIZORAL) 2 % cream Apply to web space twice a day for 1 week then daily for 1 month (Patient taking differently: Apply 1 application topically daily as needed for irritation. Apply to web space twice a day for 1 week then daily for 1 month) 15 g 0  . lidocaine-prilocaine (EMLA) cream Apply to affected area once 30 g 3  . lisinopril-hydrochlorothiazide (PRINZIDE,ZESTORETIC) 20-12.5 MG tablet Take 1 tablet by mouth daily. (Patient taking differently: Take 1 tablet by mouth every morning. ) 30 tablet 11  . OneTouch Delica Lancets 99991111 MISC 1 each by  Other route 2 (two) times daily. Use to monitor glucose levels BID; E11.65 100 each 2  . ONETOUCH VERIO test strip Use to check blood sugar TID 100 each 3  . RELION PEN NEEDLES 32G X 4 MM MISC See admin instructions.  11  . simvastatin (ZOCOR) 10 MG tablet Take 10 mg by mouth at bedtime.     . Vitamin D, Ergocalciferol, 2000 units CAPS Take 2,000 Units by mouth daily.   0   No current facility-administered medications for this visit.     Social History   Socioeconomic History  . Marital status: Single    Spouse name: Not on file  . Number of children: 0  . Years of education: Not on file  . Highest education level: Not on file  Occupational History  . Occupation: retired Education officer, [employer]  Social Needs  . Financial resource strain: Not on file  . Food insecurity    Worry: Not on file    Inability: Not on file  . Transportation needs    Medical: Not on file    Non-medical: Not on file  Tobacco Use  . Smoking status: Never Smoker  . Smokeless tobacco: Never Used  Substance and Sexual Activity  . Alcohol use: Not Currently    Alcohol/week: 0.0 standard drinks    Frequency: Never  . Drug use: Never  . Sexual activity: Not on file  Lifestyle  . Physical activity    Days per  week: Not on file    Minutes per session: Not on file  . Stress: Not on file  Relationships  . Social Herbalist on phone: Not on file    Gets together: Not on file    Attends religious service: Not on file    Active member of club or organization: Not on file    Attends meetings of clubs or organizations: Not on file    Relationship status: Not on file  . Intimate partner violence    Fear of current or ex partner: Not on file    Emotionally abused: Not on file    Physically abused: Not on file    Forced sexual activity: Not on file  Other Topics Concern  . Not on file  Social History Narrative  . Not on file    Family History  Problem Relation Age of Onset  . Diabetes Mother   .  Hypertension Mother   . Diabetes Sister   . Hypertension Sister   . Diabetes Maternal Uncle   . Colon cancer Paternal Aunt   . Diabetes Paternal Aunt   . Stomach cancer Neg Hx   . Rectal cancer Neg Hx   . Esophageal cancer Neg Hx   . Colon polyps Neg Hx     Thereasa Solo, MD 01/07/2019, 2:01 PM

## 2019-01-07 NOTE — Telephone Encounter (Signed)
Pt is still having high within the 200s in AM does goes down in the afternoon after lunch.  BS readings: 09/21am 218 9/20 pm 120; am 205 9/19 pm 120; AM 221 9/18  193 am; 132 pm

## 2019-01-07 NOTE — Patient Instructions (Signed)
Dr Denman George is concerned that the swollen lymph glands may be a sign that the cancer has returned.  She has ordered a PET scan to evaluate for glowing in these regions which is a sign of cancer.  If this is positive, Dr Alvy Bimler would discuss therapies with you such as additional chemotherapy or hormonal treatments.  If treatment is not necessary at this time, please follow-up with Dr Denman George in December, 2020.

## 2019-01-07 NOTE — Telephone Encounter (Signed)
Please advise 

## 2019-01-08 DIAGNOSIS — M1711 Unilateral primary osteoarthritis, right knee: Secondary | ICD-10-CM | POA: Diagnosis not present

## 2019-01-15 NOTE — Telephone Encounter (Signed)
Please advise 

## 2019-01-15 NOTE — Telephone Encounter (Signed)
Patient called to advise that since her cortisone shot and even with increasing her insulin to 25 units her blood sugars are still in the 220's.  Please advise at [phone]

## 2019-01-15 NOTE — Telephone Encounter (Signed)
Please increase to 30 units with breakfast

## 2019-01-15 NOTE — Telephone Encounter (Signed)
Called pt and informed of new orders. Verbalized acceptance and understanding. 

## 2019-01-16 ENCOUNTER — Other Ambulatory Visit: Payer: Self-pay

## 2019-01-16 ENCOUNTER — Telehealth: Payer: Self-pay | Admitting: Endocrinology

## 2019-01-16 ENCOUNTER — Encounter (HOSPITAL_COMMUNITY)
Admission: RE | Admit: 2019-01-16 | Discharge: 2019-01-16 | Disposition: A | Discharge status: Home / Self Care | Payer: Medicare Other | Source: Ambulatory Visit | Attending: Gynecologic Oncology | Admitting: Gynecologic Oncology

## 2019-01-16 DIAGNOSIS — R911 Solitary pulmonary nodule: Secondary | ICD-10-CM | POA: Diagnosis not present

## 2019-01-16 DIAGNOSIS — Z79899 Other long term (current) drug therapy: Secondary | ICD-10-CM | POA: Insufficient documentation

## 2019-01-16 DIAGNOSIS — C541 Malignant neoplasm of endometrium: Secondary | ICD-10-CM | POA: Insufficient documentation

## 2019-01-16 DIAGNOSIS — N189 Chronic kidney disease, unspecified: Secondary | ICD-10-CM | POA: Insufficient documentation

## 2019-01-16 DIAGNOSIS — J320 Chronic maxillary sinusitis: Secondary | ICD-10-CM | POA: Insufficient documentation

## 2019-01-16 DIAGNOSIS — R590 Localized enlarged lymph nodes: Secondary | ICD-10-CM | POA: Diagnosis not present

## 2019-01-16 DIAGNOSIS — E213 Hyperparathyroidism, unspecified: Secondary | ICD-10-CM | POA: Insufficient documentation

## 2019-01-16 DIAGNOSIS — Z794 Long term (current) use of insulin: Secondary | ICD-10-CM | POA: Diagnosis not present

## 2019-01-16 DIAGNOSIS — E785 Hyperlipidemia, unspecified: Secondary | ICD-10-CM | POA: Insufficient documentation

## 2019-01-16 DIAGNOSIS — I129 Hypertensive chronic kidney disease with stage 1 through stage 4 chronic kidney disease, or unspecified chronic kidney disease: Secondary | ICD-10-CM | POA: Diagnosis not present

## 2019-01-16 DIAGNOSIS — E1122 Type 2 diabetes mellitus with diabetic chronic kidney disease: Secondary | ICD-10-CM | POA: Insufficient documentation

## 2019-01-16 LAB — GLUCOSE, CAPILLARY: Glucose-Capillary: 187 mg/dL — ABNORMAL HIGH (ref 70–99)

## 2019-01-16 MED ORDER — FLUDEOXYGLUCOSE F - 18 (FDG) INJECTION
10.1700 | Freq: Once | INTRAVENOUS | Status: AC | PRN
  Administered 2019-01-16: 10.17 via INTRAVENOUS

## 2019-01-16 NOTE — Telephone Encounter (Signed)
Please advise 

## 2019-01-16 NOTE — Telephone Encounter (Signed)
Please continue the same

## 2019-01-16 NOTE — Telephone Encounter (Signed)
As requested, left a detailed message informing pt of Dr. Cordelia Pen advice.

## 2019-01-16 NOTE — Telephone Encounter (Signed)
Patients blood sugar came down to 135 yesterday afternoon. This morning was 180 fasting. Patient is taking a test this morning but will be able to eat at 11:00. Patient would like to know if Dr. Loanne Drilling would like to continue her on the increased dosage (30 units) or go back to the old one(25units)?  Please Advise, Thanks  Leave a detailed VM

## 2019-01-21 ENCOUNTER — Telehealth: Payer: Self-pay

## 2019-01-21 NOTE — Telephone Encounter (Signed)
I spoke with Ms Grantham and gave her the PET scan results of enlargement and increased activity in the left axillary, right axillary and left inguinal adenopathy.  I told her Dr. Harrington Challenger wants her to follow up with Dr. Alvy Bimler for further treatment.  Pt has an appt on 02/03/19.  We will see if Dr. Alvy Bimler wants to see her sooner.  We will call the patient back tomorrow .

## 2019-01-22 ENCOUNTER — Telehealth: Payer: Self-pay | Admitting: Oncology

## 2019-01-22 NOTE — Telephone Encounter (Signed)
Krista Johnson with appointment to see Dr. Alvy Bimler tomorrow at 10:30.  She verbalized understanding and agreement.

## 2019-01-23 ENCOUNTER — Inpatient Hospital Stay: Payer: Medicare Other | Attending: Gynecology | Admitting: Hematology and Oncology

## 2019-01-23 ENCOUNTER — Other Ambulatory Visit: Payer: Self-pay

## 2019-01-23 VITALS — BP 128/69 | HR 95 | Temp 98.9°F | Resp 18 | Ht 66.0 in | Wt 201.2 lb

## 2019-01-23 DIAGNOSIS — I129 Hypertensive chronic kidney disease with stage 1 through stage 4 chronic kidney disease, or unspecified chronic kidney disease: Secondary | ICD-10-CM | POA: Insufficient documentation

## 2019-01-23 DIAGNOSIS — Z5112 Encounter for antineoplastic immunotherapy: Secondary | ICD-10-CM | POA: Insufficient documentation

## 2019-01-23 DIAGNOSIS — Z9221 Personal history of antineoplastic chemotherapy: Secondary | ICD-10-CM | POA: Diagnosis not present

## 2019-01-23 DIAGNOSIS — Z7189 Other specified counseling: Secondary | ICD-10-CM | POA: Diagnosis not present

## 2019-01-23 DIAGNOSIS — C786 Secondary malignant neoplasm of retroperitoneum and peritoneum: Secondary | ICD-10-CM | POA: Insufficient documentation

## 2019-01-23 DIAGNOSIS — C773 Secondary and unspecified malignant neoplasm of axilla and upper limb lymph nodes: Secondary | ICD-10-CM | POA: Insufficient documentation

## 2019-01-23 DIAGNOSIS — C541 Malignant neoplasm of endometrium: Secondary | ICD-10-CM | POA: Diagnosis not present

## 2019-01-23 DIAGNOSIS — Z923 Personal history of irradiation: Secondary | ICD-10-CM | POA: Insufficient documentation

## 2019-01-23 DIAGNOSIS — Z794 Long term (current) use of insulin: Secondary | ICD-10-CM | POA: Diagnosis not present

## 2019-01-23 DIAGNOSIS — I700 Atherosclerosis of aorta: Secondary | ICD-10-CM | POA: Diagnosis not present

## 2019-01-23 DIAGNOSIS — Z79899 Other long term (current) drug therapy: Secondary | ICD-10-CM | POA: Insufficient documentation

## 2019-01-23 DIAGNOSIS — N183 Chronic kidney disease, stage 3 unspecified: Secondary | ICD-10-CM | POA: Insufficient documentation

## 2019-01-24 ENCOUNTER — Telehealth: Payer: Self-pay | Admitting: Hematology and Oncology

## 2019-01-24 ENCOUNTER — Other Ambulatory Visit: Payer: Self-pay | Admitting: Hematology and Oncology

## 2019-01-24 ENCOUNTER — Telehealth: Payer: Self-pay

## 2019-01-24 ENCOUNTER — Telehealth: Payer: Self-pay | Admitting: Pharmacist

## 2019-01-24 ENCOUNTER — Encounter: Payer: Self-pay | Admitting: Hematology and Oncology

## 2019-01-24 DIAGNOSIS — C541 Malignant neoplasm of endometrium: Secondary | ICD-10-CM

## 2019-01-24 DIAGNOSIS — C773 Secondary and unspecified malignant neoplasm of axilla and upper limb lymph nodes: Secondary | ICD-10-CM

## 2019-01-24 DIAGNOSIS — Z7189 Other specified counseling: Secondary | ICD-10-CM

## 2019-01-24 MED ORDER — LIDOCAINE-PRILOCAINE 2.5-2.5 % EX CREA: TOPICAL_CREAM | CUTANEOUS | 3 refills | Status: DC

## 2019-01-24 MED ORDER — ONDANSETRON HCL 8 MG PO TABS: 8.0000 mg | ORAL_TABLET | Freq: Every day | ORAL | 1 refills | Status: DC

## 2019-01-24 MED ORDER — LENVIMA (10 MG DAILY DOSE) 10 MG PO CPPK: 10.0000 mg | ORAL_CAPSULE | Freq: Every day | ORAL | 11 refills | Status: DC

## 2019-01-24 NOTE — Telephone Encounter (Signed)
Oral Oncology Patient Advocate Migdalia Dk copay is [phone]. This is not affordable for the patient.  There are no grants available for the patients disease state.  I can help the patient apply for patient assistance with [employer] patient assistance program in an attempt to get Lenvima at $0 out of pocket cost to the patient.  I explained this to the patient and she agreed to apply. The patient will come by the cancer center tomorrow, 0000000 to sign the application and bring her income documentation.  The patient verbalized understanding and great appreciation.  [place] Patient [place] Phone [phone] Fax [phone] 01/24/2019   3:32 PM

## 2019-01-24 NOTE — Telephone Encounter (Signed)
Scheduled appt per 10/8 sch message- pt aware of appt date and time   

## 2019-01-24 NOTE — Telephone Encounter (Signed)
Oral Oncology Patient Advocate Encounter  Prior Authorization for Krista Johnson has been approved.    PA# [id number] Effective dates: 01/24/19 through 07/23/19  Oral Oncology Clinic will continue to follow.   [name] Phone [phone] Fax [phone] 01/24/2019    2:59 PM

## 2019-01-24 NOTE — Telephone Encounter (Signed)
Oral Oncology Patient Advocate Encounter  Received notification from [facility] that prior authorization for Krista Johnson is required.  PA submitted on CoverMyMeds Key [id number] Status is pending  Oral Oncology Clinic will continue to follow.  [place] Patient [name] Phone [phone] Fax [phone] 01/24/2019    2:51 PM

## 2019-01-24 NOTE — Progress Notes (Signed)
DISCONTINUE ON PATHWAY REGIMEN - Uterine     A cycle is every 21 days:     Paclitaxel      Carboplatin   **Always confirm dose/schedule in your pharmacy ordering system**  REASON: Other Reason PRIOR TREATMENT: UTOS50: Carboplatin + Paclitaxel (6/175) q21 Days x 6 Cycles TREATMENT RESPONSE: Stable Disease (SD)  Uterine - No Medical Intervention - Off Treatment.  Patient Characteristics: Papillary Serous and Clear Cell Histology, Newly Diagnosed, Resected

## 2019-01-24 NOTE — Telephone Encounter (Signed)
Oral Oncology Pharmacist Encounter  Received new prescription for Lenvima (lenvatinib) for the treatment of metastatic endometrial cancer in conjunction with pembrolizumab, planned duration until disease progression or unacceptable drug toxicity. Planned start 02/01/2019, with IV therapy.  BP from 01/23/2019 reviewed, BP well controlled. Urine protein ordered by MD. No recent CMP, CMP ordered for next lab visit. patient known to have renal impairment. Lenvatinib reduced by MD to 10mg  daily. 10mg  of lenvatinib would be the reduced dose for CrC <30 and severe hepatic impairment, so okay to proceed with 10mg  dose. Recommend reviewing both the CMP and urine protein once they are obtained.   Current medication list in Epic reviewed, one DDIs with lenvatinib identified: - Ondansetron: QT-prolonging medications like lenvatinib my enhance the QTc effect of ondansetron. Patient prescribed 8mg  daily, no documented history of cardiac problems. No recent ECG, but ECG from last year showed a QTc of 476ms. No baseline medication adjusted needed. Given the PO route and the once daily dosing, would recommend a ECG if the frequency of ondansetron is increased.   Prescription has been e-scribed to the [facility] for benefits analysis and approval.  Oral Oncology Clinic will continue to follow for insurance authorization, copayment issues, initial counseling and start date.  Darl Pikes, PharmD, BCPS, [facility] Hematology/Oncology Clinical Pharmacist [facility] [place] [facility] [phone]  01/24/2019 12:23 PM

## 2019-01-24 NOTE — Progress Notes (Signed)
DISCONTINUE ON PATHWAY REGIMEN - Uterine  No Medical Intervention - Off Treatment.  REASON: Disease Progression PRIOR TREATMENT: Off Treatment  START OFF PATHWAY REGIMEN - Uterine   [id number]:Lenvatinib 20 mg PO Daily D1-21 + Pembrolizumab 200 mg IV D1 q21 Days:   A cycle is every 21 days:     Lenvatinib      Pembrolizumab   **Always confirm dose/schedule in your pharmacy ordering system**  Patient Characteristics: Papillary Serous and Clear Cell Histology, Recurrent/Progressive Disease, Second Line, Relapse ? 6 Months From Prior Therapy Histology: Papillary Serous and Clear Cell Histology Therapeutic Status: Recurrent or Progressive Disease AJCC T Category: T3 AJCC N Category: N0 AJCC M Category: M1 AJCC 8 Stage Grouping: IVB Line of Therapy: Second Line Time to Recurrence: Relapse ? 6 Months From Prior Therapy Intent of Therapy: Non-Curative / Palliative Intent, Discussed with Patient

## 2019-01-24 NOTE — Progress Notes (Signed)
[place] OFFICE PROGRESS NOTE  Patient Care Team: Jonathon Jordan, MD as PCP - General (Family Medicine)  ASSESSMENT & PLAN:  Endometrial cancer [facility]) I have reviewed multiple imaging studies with the patient She is currently symptomatic with disease progression We discussed treatment options She has reasonable good performance status right now  Options discussed including treatment with carboplatin and Taxol, pembrolizumab in combination with lenvatinib, antiestrogen therapy or everolimus plus antiestrogen therapy The risks, benefits, side effects of each treatment options were discussed and ultimately she made informed decision to pursue pembrolizumab in combination with lenvatinib   We reviewed the current guidelines Goal is palliative I recommend switching her treatment with Lenvima and pembrolizumab.   The combination was approved following review conducted under [employer], an intiative of the [place] which provides a framework for concurrent submission and review of oncology drugs among international partners. The FDA approved the combination with the [place] Therapeutic Goods Administration and [facility] [place].   Efficacy of the drugs together was investigated in Study 111/KEYNOTE-146 ([id number]), a single-arm, multicenter, open-label, multi-cohort trial that enrolled 108 patients with metastatic endometrial cancer that had progressed following at least one prior systemic therapy in any setting.  Patients took 20 mg of lenvatinib orally once daily in combination with 200 mg of pembrolizumab administered intravenously every 3 weeks until unacceptable toxicity or disease progression.  Among the 108 patients, 94 had tumors that were not MSI-H or dMMR, 11 had tumors that were MSI-H or dMMR, and in 3 patients the tumor MSI-H or dMMR status was not known.  Results  The major efficacy outcome measures were objective response rate (ORR) and  duration of response (DOR) by independent radiologic review committee using RECIST 1.1. The ORR in the 94 patients whose tumors were not MSI-H or dMMR was 38.3% (95% CI: 29%, 49%) with 10 complete responses (10.6%) and 26 partial responses (27.7%). Median DOR was not reached at the time of data cutoff and 25 patients (69% of responders) had response durations ?6 months.  In another updated publication published on JCO: DOI: 10.1200/JCO.19.02627 Journal of Clinical Oncology, Published online June 29, 2018. Lenvatinib Plus Pembrolizumab in Patients With Advanced Endometrial Cancer Abstract PURPOSE  Patients with advanced endometrial carcinoma have limited treatment options. We report final primary efficacy analysis results for a patient cohort with advanced endometrial carcinoma receiving lenvatinib plus pembrolizumab in an ongoing phase Ib/II study of selected solid tumors. METHODS  Patients took lenvatinib 20 mg once daily orally plus pembrolizumab 200 mg intravenously once every 3 weeks, in 3-week cycles. The primary end point was objective response rate (ORR) at 24 weeks ([id number]); secondary efficacy end points included duration of response (DOR), progression-free survival (PFS), and overall survival (OS). Tumor assessments were evaluated by investigators per immune-related RECIST.  RESULTS  At data cutoff, 108 patients with previously treated endometrial carcinoma were enrolled, with a median follow-up of 18.7 months. The JJKKX38 was 38.0% (95% CI, 28.8% to 47.8%). Among subgroups, the HWEXH37 (95% CI) was 63.6% (30.8% to 89.1%) in patients with microsatellite instability (MSI)-high tumors (n = 11) and 36.2% (26.5% to 46.7%) in patients with microsatellite-stable tumors (n = 94). For previously treated patients, regardless of tumor MSI status, the median DOR was 21.2 months (95% CI, 7.6 months to not estimable), median PFS was 7.4 months (95% CI, 5.3 to 8.7 months), and median OS was 16.7 months (15.0  months to not estimable). Grade 3 or 4 treatment-related adverse events occurred in 83/124 (  66.9%) patients. CONCLUSION  Lenvatinib plus pembrolizumab showed promising antitumor activity in patients with advanced endometrial carcinoma who have experienced disease progression after prior systemic therapy, regardless of tumor MSI status. The combination therapy had a manageable toxicity profile. The most common adverse reactions for endometrial cancer were fatigue, hypertension, musculoskeletal pain, diarrhea, decreased appetite, hypothyroidism, nausea, stomatitis, vomiting, decreased weight, abdominal pain, headache, constipation, urinary tract infection, dysphonia, hemorrhagic events, hypomagnesemia, palmar-plantar erythrodysesthesia, dyspnea, cough, and rash.   The most common adverse reactions for endometrial cancer were fatigue, hypertension, musculoskeletal pain, diarrhea, decreased appetite, hypothyroidism, nausea, stomatitis, vomiting, decreased weight, abdominal pain, headache, constipation, urinary tract infection, dysphonia, hemorrhagic events, hypomagnesemia, palmar-plantar erythrodysesthesia, dyspnea, cough, and rash.   After much discussion, the patient would like to proceed with the plan of care. She is advised to get her blood pressure checked twice a day while on lenvatinib I will see her prior to cycle 2 of treatment    Chronic kidney disease (CKD), stage III (moderate) ([place]) She has stable chronic kidney disease I plan to reduce lenvatinib to 10 mg upfront  Goals of care, counseling/discussion She understood the goals of care is strictly palliative in nature We discussed the importance of advanced directives and living will   Orders Placed This Encounter  Procedures  . Total Protein, Urine dipstick    Standing Status:   Standing    Number of Occurrences:   9    Standing Expiration Date:   01/24/2020    INTERVAL HISTORY: Please see below for problem oriented  charting. 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 Leo Grosser who obtained an endometrial biopsy showing poorly differentiated endometrial carcinoma and negative endocervical curettage   12/12/2017 Imaging   MAMMOGRAM FINDINGS: In the right axilla, a possible mass warrants further evaluation. In the left breast, no findings suspicious for malignancy.  Images were processed with CAD.  IMPRESSION: Further evaluation is suggested for possible mass in the right axilla.    12/24/2017 Imaging   Ct scan chest, abdomen and pelvis 1. Marked thickening of the endometrium (42 mm) compatible with known primary endometrial malignancy. No evidence of extrauterine invasion. 2. No pelvic or retroperitoneal adenopathy. 3. Right middle lobe 4 mm solid pulmonary nodule, for which follow-up chest CT is advised in 3-6 months. 4. Several findings that are equivocal for distant metastatic disease. Vaguely nodular heterogeneous hyperenhancement in the peripheral right liver lobe, which could represent benign transient perfusional phenomena, with underlying liver lesions not entirely excluded. Mildly sclerotic T12 vertebral lesion. Mildly enlarged right axillary lymph node. The best single test to further evaluate these findings would be a PET-CT. Alternative tests include bone scan or thoracic MRI without and with IV contrast for the  T12 osseous lesion, MRI abdomen without and with IV contrast for the liver findings, and diagnostic mammographic evaluation for the right axillary node. 5.  Aortic Atherosclerosis (ICD10-I70.0).    01/09/2018 PET scan   1. Moderate hypermetabolism corresponding  to enlarging axillary node since 12/22/2017. Highly suspicious for an atypical distribution of metastatic disease. 2. No hypermetabolism to suggest hepatic or T12 osseous metastasis. 3. Hypermetabolic endometrial primary.   01/23/2018 Initial Diagnosis   Endometrial cancer ([place])   01/23/2018 Pathology Results   1. Lymph node, sentinel, biopsy, left external iliac - NO CARCINOMA IDENTIFIED IN ONE LYMPH NODE (0/1) - SEE COMMENT 2. Lymph nodes, regional resection, right para aortic - NO CARCINOMA IDENTIFIED IN FOUR LYMPH NODES (0/4) - SEE COMMENT 3. Lymph nodes, regional resection, right pelvic - NO CARCINOMA IDENTIFIED IN EIGHT LYMPH NODES (0/8) - SEE COMMENT 4. Cul-de-sac biopsy - METASTATIC CARCINOMA 5. Uterus +/- tubes/ovaries, neoplastic, cervix, bilateral fallopian tubes and ovaries UTERUS: - MIXED SEROUS AND ENDOMETRIOID CARCINOMA - SEROSAL IMPLANTS PRESENT - LYMPHOVASCULAR SPACE INVASION PRESENT - LEIOMYOMATA (1.5 CM; LARGEST) - SEE ONCOLOGY TABLE AND COMMENT BELOW CERVIX: - BENIGN NABOTHIAN CYSTS - NO CARCINOMA IDENTIFIED BILATERAL OVARIES: - METASTATIC CARCINOMA PRESENT ON OVARIAN SURFACE BILATERAL FALLOPIAN TUBES: - INTRALUMINAL CARCINOMA Microscopic Comment 1. -3. Cytokeratin AE1/3 was performed on the sentinel lymph nodes to exclude micrometastasis. There is no evidence of metastatic carcinoma by immunohistochemistry. 5. 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%) Histologic  Grade: N/A Myometrial invasion: Estimated less than 50% myometrial invasion (0.3 cm of myometrium involved; 1.4 cm measured thickness) Uterine Serosa Involvement: Present Cervical stromal involvement: Not identified Extent of involvement of other organs: - Fallopian tube (left within the lumen) - Ovary, left (surface involvement) - Cul-de-sac Lymphovascular invasion: Present Regional Lymph Nodes: Examined: 1 Sentinel 12 Non-sentinel 13 Total Lymph nodes with metastasis: 0 Isolated tumor cells (< 0.2 mm): 0 Micrometastasis: (> 0.2 mm and < 2.0 mm): 0 Macrometastasis: (> 2.0 mm): 0 Extracapsular extension: N/A Tumor block for ancillary studies: 5E, 5B MMR / MSI testing: Pending will be reported separately Pathologic Stage Classification (pTNM, AJCC 8th edition): pT3a, pN0 FIGO Stage: IIIA COMMENT: There is tumor present on the surface of the left ovary and within the lumen of the left fallopian tube. The carcinoma appears to be mixed with the largest component being high grade serous carcinoma. Dr. Lyndon Code reviewed the case and agrees with the above diagnosis.   01/23/2018 Surgery   Surgeon: Donaciano Eva   Operation: Robotic-assisted laparoscopic total hysterectomy with bilateral salpingoophorectomy, SLN injection, mapping and biopsy, right pelvic and para-aortic lymphadenectomy  Operative Findings:  : 10-12cm bulky uterus with frank serosal involvement on posterior cul de sac peritoneum and anterior peritoneum which adhesed the bladder to the anterior uterus. Unilateral mapping on left pelvis. No grossly suspicious nodes. Normal omentum and diaphragms.    02/01/2018 Pathology Results   Lymph node, needle/core biopsy, right axilla - METASTATIC CARCINOMA - SEE COMMENT Microscopic Comment The neoplastic cells are positive for cytokeratin 7 and Pax-8 but negative for cytokeratin 5/6, cytokeratin 20, Gata-3, p63, p53 and GCDFP. Overall, the immunoprofile is consistent with  metastasis from the patient's known gynecologic carcinoma.   02/01/2018 Procedure   Ultrasound-guided core biopsies of a suspicious right axillary lymph node.   02/21/2018 - 06/13/2018 Chemotherapy   The patient had carboplatin & Taxol x 6   03/26/2018 - 04/30/2018 Radiation Therapy   Radiation treatment dates:   03/26/18, 04/02/18, 04/19/18, 04/23/18 04/30/18  Site/dose: proximal Vagina, 6 Gy in 5 fractions for a total dose of 30 Gy  04/26/2018 PET scan   1. Interval resection of the hypermetabolic endometrial primary. No discernible hypermetabolic pelvic sidewall or peritoneal metastases. 2. Stable hypermetabolic bilateral axillary lymphadenopathy. The degree of hypermetabolism in these lymph nodes remains highly suspicious for neoplasm. 3. Interval development of low level FDG uptake in 2 stable, small lymph nodes in the left groin region. This may be reactive, but close attention recommended to exclude metastatic involvement. 4. Stable non hypermetabolic low-density liver lesions and the mixed lucent and sclerotic T12 lesion is stable without hypermetabolism today.   07/09/2018 Imaging   Status post hysterectomy.  Mild axillary lymphadenopathy, improved. Nodal metastases not excluded.  Irregular wall thickening involving the anterior bladder, possibly reflecting radiation cystitis versus tumor.  Additional stable findings as above, including a 5 mm right middle lobe nodule and stable sclerosis involving the T12 vertebral body.   10/10/2018 Imaging   1. Stable exam. No findings within the abdomen or pelvis to suggest metastatic disease. 2. Unchanged appearance of sclerosis involving the T12 vertebra. 3.  Aortic Atherosclerosis (ICD10-I70.0).   10/10/2018 Imaging   Ct abdomen and pelvis 1. Stable exam. No findings within the abdomen or pelvis to suggest metastatic disease. 2. Unchanged appearance of sclerosis involving the T12 vertebra. 3.  Aortic Atherosclerosis (ICD10-I70.0).    01/16/2019 PET scan   1. Enlargement and increased activity in the left axillary, right axillary, and left inguinal adenopathy compatible with progressive malignancy. 2. Stable 4 mm right middle lobe subpleural nodule, not appreciably hypermetabolic. 3. Other imaging findings of potential clinical significance: Right kidney lower pole cyst. Aortic Atherosclerosis (ICD10-I70.0). Prominent stool throughout the colon favors constipation. Mild chronic left maxillary sinusitis.     01/30/2019 -  Chemotherapy   The patient had pembrolizumab (KEYTRUDA) 200 mg in sodium chloride 0.9 % 50 mL chemo infusion, 200 mg, Intravenous, Once, 0 of 6 cycles  for chemotherapy treatment.      REVIEW OF SYSTEMS:   Constitutional: Denies fevers, chills or abnormal weight loss Eyes: Denies blurriness of vision Ears, nose, mouth, throat, and face: Denies mucositis or sore throat Respiratory: Denies cough, dyspnea or wheezes Cardiovascular: Denies palpitation, chest discomfort or lower extremity swelling Gastrointestinal:  Denies nausea, heartburn or change in bowel habits Skin: Denies abnormal skin rashes Neurological:Denies numbness, tingling or new weaknesses Behavioral/Psych: Mood is stable, no new changes  All other systems were reviewed with the patient and are negative.  I have reviewed the past medical history, past surgical history, social history and family history with the patient and they are unchanged from previous note.  ALLERGIES:  is allergic to sulfa antibiotics and sulfamethoxazole.  MEDICATIONS:  Current Outpatient Medications  Medication Sig Dispense Refill  . Insulin Isophane & Regular Human (NOVOLIN 70/30 FLEXPEN) (70-30) 100 UNIT/ML PEN Inject 20 Units into the skin daily with breakfast. also pen needles 1/day 15 mL 11  . ketoconazole (NIZORAL) 2 % cream Apply to web space twice a day for 1 week then daily for 1 month (Patient taking differently: Apply 1 application topically daily as  needed for irritation. Apply to web space twice a day for 1 week then daily for 1 month) 15 g 0  . lenvatinib 10 mg daily dose (LENVIMA, 10 MG DAILY DOSE,) capsule Take 1 capsule (10 mg total) by mouth daily. 30 capsule 11  . lidocaine-prilocaine (EMLA) cream Apply to affected area once 30 g 3  . lidocaine-prilocaine (EMLA) cream Apply to affected area once 30 g 3  . lisinopril-hydrochlorothiazide (PRINZIDE,ZESTORETIC) 20-12.5 MG tablet Take  1 tablet by mouth daily. (Patient taking differently: Take 1 tablet by mouth every morning. ) 30 tablet 11  . ondansetron (ZOFRAN) 8 MG tablet Take 1 tablet (8 mg total) by mouth daily. 30 tablet 1  . OneTouch Delica Lancets 79K MISC 1 each by Other route 2 (two) times daily. Use to monitor glucose levels BID; E11.65 100 each 2  . ONETOUCH VERIO test strip Use to check blood sugar TID 100 each 3  . RELION PEN NEEDLES 32G X 4 MM MISC See admin instructions.  11  . simvastatin (ZOCOR) 10 MG tablet Take 10 mg by mouth at bedtime.     . Vitamin D, Ergocalciferol, 2000 units CAPS Take 2,000 Units by mouth daily.   0   No current facility-administered medications for this visit.     PHYSICAL EXAMINATION: ECOG PERFORMANCE STATUS: 1 - Symptomatic but completely ambulatory  Vitals:   01/23/19 1048  BP: 128/69  Pulse: 95  Resp: 18  Temp: 98.9 F (37.2 C)  SpO2: 100%   Filed Weights   01/23/19 1048  Weight: 201 lb 3.2 oz (91.3 kg)    GENERAL:alert, no distress and comfortable SKIN: skin color, texture, turgor are normal, no rashes or significant lesions EYES: normal, Conjunctiva are pink and non-injected, sclera clear OROPHARYNX:no exudate, no erythema and lips, buccal mucosa, and tongue normal  NECK: supple, thyroid normal size, non-tender, without nodularity LYMPH: She has palpable lymphadenopathy in her inguinal region and bilateral axillary region LUNGS: clear to auscultation and percussion with normal breathing effort HEART: regular rate & rhythm  and no murmurs and no lower extremity edema ABDOMEN:abdomen soft, non-tender and normal bowel sounds Musculoskeletal:no cyanosis of digits and no clubbing  NEURO: alert & oriented x 3 with fluent speech, no focal motor/sensory deficits  LABORATORY DATA:  I have reviewed the data as listed    Component Value Date/Time   NA 140 10/10/2018 0830   K 4.3 10/10/2018 0830   CL 106 10/10/2018 0830   CO2 23 10/10/2018 0830   GLUCOSE 166 (H) 10/10/2018 0830   BUN 29 (H) 10/10/2018 0830   CREATININE 1.46 (H) 10/10/2018 0830   CREATININE 1.39 (H) 07/09/2018 0933   CALCIUM 9.9 12/31/2018 0958   PROT 7.6 10/10/2018 0830   ALBUMIN 3.7 10/10/2018 0830   AST 15 10/10/2018 0830   AST 17 07/09/2018 0933   ALT 24 10/10/2018 0830   ALT 14 07/09/2018 0933   ALKPHOS 80 10/10/2018 0830   BILITOT 0.4 10/10/2018 0830   BILITOT 0.5 07/09/2018 0933   GFRNONAA 37 (L) 10/10/2018 0830   GFRNONAA 39 (L) 07/09/2018 0933   GFRAA 43 (L) 10/10/2018 0830   GFRAA 45 (L) 07/09/2018 0933    No results found for: SPEP, UPEP  Lab Results  Component Value Date   WBC 4.9 10/10/2018   NEUTROABS 3.2 10/10/2018   HGB 10.9 (L) 10/10/2018   HCT 33.9 (L) 10/10/2018   MCV 89.2 10/10/2018   PLT 158 10/10/2018      Chemistry      Component Value Date/Time   NA 140 10/10/2018 0830   K 4.3 10/10/2018 0830   CL 106 10/10/2018 0830   CO2 23 10/10/2018 0830   BUN 29 (H) 10/10/2018 0830   CREATININE 1.46 (H) 10/10/2018 0830   CREATININE 1.39 (H) 07/09/2018 0933      Component Value Date/Time   CALCIUM 9.9 12/31/2018 0958   ALKPHOS 80 10/10/2018 0830   AST 15 10/10/2018 0830   AST 17  07/09/2018 0933   ALT 24 10/10/2018 0830   ALT 14 07/09/2018 0933   BILITOT 0.4 10/10/2018 0830   BILITOT 0.5 07/09/2018 0933       RADIOGRAPHIC STUDIES: I have reviewed multiple imaging studies with the patient I have personally reviewed the radiological images as listed and agreed with the findings in the report. Nm Pet  Image Restag (ps) Skull Base To Thigh  Result Date: 01/16/2019 CLINICAL DATA:  Subsequent treatment strategy for endometrial cancer. EXAM: NUCLEAR MEDICINE PET SKULL BASE TO THIGH TECHNIQUE: 10.2 mCi F-18 FDG was injected intravenously. Full-ring PET imaging was performed from the skull base to thigh after the radiotracer. CT data was obtained and used for attenuation correction and anatomic localization. Fasting blood glucose: 187 mg/dl COMPARISON:  Multiple exams, including 04/26/2018 and CT scans from 10/10/2018 and 07/09/2018 FINDINGS: Mediastinal blood pool activity: SUV max 3.9 Liver activity: SUV max 4.8 NECK: Bilateral glottic activity is likely physiologic. No pathologic adenopathy in the neck. Incidental CT findings: Mild chronic left maxillary sinusitis. CHEST: Left axillary lymph node measuring 1.8 cm in short axis on image 48/4 has a maximum SUV of 13.4, formerly 7.9. This lymph node measured 1.2 cm on the prior PET-CT of 04/26/2018 and more recently on 07/09/2018 measured 0.9 cm. A right axillary lymph node with central hypointensity and peripheral hyperintensity measures 3.1 cm in short axis on image 40/4 and has a maximum SUV of 8.0, formerly the same. This lesion previously measured 1.7 cm in short axis on prior PET-CT of 04/26/2018, and more recently measured 1.2 cm in short axis on the CT from 07/09/2018. Incidental CT findings: Right Port-A-Cath tip: SVC. 4 mm subpleural nodule in the right middle lobe along the minor fissure, image 31/8, unchanged and not appreciably hypermetabolic. ABDOMEN/PELVIS: Left inguinal lymph node 1.8 cm in short axis on image 166/4 with maximum SUV 17.0, formerly 3.2 this lesion previously measured 0.7 cm in short axis on 04/26/2018 and more recently 0.7 cm in short axis on 06/04 20 07/2018. Incidental CT findings: Photopenic right kidney lower pole cyst. Aortoiliac atherosclerotic vascular disease. Prominent stool throughout the colon favors constipation. SKELETON:  No significant abnormal hypermetabolic activity in this region. Incidental CT findings: none IMPRESSION: 1. Enlargement and increased activity in the left axillary, right axillary, and left inguinal adenopathy compatible with progressive malignancy. 2. Stable 4 mm right middle lobe subpleural nodule, not appreciably hypermetabolic. 3. Other imaging findings of potential clinical significance: Right kidney lower pole cyst. Aortic Atherosclerosis (ICD10-I70.0). Prominent stool throughout the colon favors constipation. Mild chronic left maxillary sinusitis. Electronically Signed   By: Van Clines M.D.   On: 01/16/2019 11:53    All questions were answered. The patient knows to call the clinic with any problems, questions or concerns. No barriers to learning was detected.  I spent 40 minutes counseling the patient face to face. The total time spent in the appointment was 55 minutes and more than 50% was on counseling and review of test results  [name], MD 01/24/2019 9:15 AM

## 2019-01-24 NOTE — Assessment & Plan Note (Signed)
She has stable chronic kidney disease I plan to reduce lenvatinib to 10 mg upfront

## 2019-01-24 NOTE — Assessment & Plan Note (Signed)
I have reviewed multiple imaging studies with the patient She is currently symptomatic with disease progression We discussed treatment options She has reasonable good performance status right now  Options discussed including treatment with carboplatin and Taxol, pembrolizumab in combination with lenvatinib, antiestrogen therapy or everolimus plus antiestrogen therapy The risks, benefits, side effects of each treatment options were discussed and ultimately she made informed decision to pursue pembrolizumab in combination with lenvatinib   We reviewed the current guidelines Goal is palliative I recommend switching her treatment with Lenvima and pembrolizumab.   The combination was approved following review conducted under [employer], an intiative of the [employer] which provides a framework for concurrent submission and review of oncology drugs among international partners. The FDA approved the combination with the [place] Therapeutic Goods Administration and [facility] [place].   Efficacy of the drugs together was investigated in Study 111/KEYNOTE-146 ([id number]), a single-arm, multicenter, open-label, multi-cohort trial that enrolled 108 patients with metastatic endometrial cancer that had progressed following at least one prior systemic therapy in any setting.  Patients took 20 mg of lenvatinib orally once daily in combination with 200 mg of pembrolizumab administered intravenously every 3 weeks until unacceptable toxicity or disease progression.  Among the 108 patients, 94 had tumors that were not MSI-H or dMMR, 11 had tumors that were MSI-H or dMMR, and in 3 patients the tumor MSI-H or dMMR status was not known.  Results  The major efficacy outcome measures were objective response rate (ORR) and duration of response (DOR) by independent radiologic review committee using RECIST 1.1. The ORR in the 94 patients whose tumors were not MSI-H or dMMR was 38.3% (95% CI: 29%,  49%) with 10 complete responses (10.6%) and 26 partial responses (27.7%). Median DOR was not reached at the time of data cutoff and 25 patients (69% of responders) had response durations ?6 months.  In another updated publication published on JCO: DOI: 10.1200/JCO.19.02627 Journal of Clinical Oncology, Published online June 29, 2018. Lenvatinib Plus Pembrolizumab in Patients With Advanced Endometrial Cancer Abstract PURPOSE  Patients with advanced endometrial carcinoma have limited treatment options. We report final primary efficacy analysis results for a patient cohort with advanced endometrial carcinoma receiving lenvatinib plus pembrolizumab in an ongoing phase Ib/II study of selected solid tumors. METHODS  Patients took lenvatinib 20 mg once daily orally plus pembrolizumab 200 mg intravenously once every 3 weeks, in 3-week cycles. The primary end point was objective response rate (ORR) at 24 weeks ([id number]); secondary efficacy end points included duration of response (DOR), progression-free survival (PFS), and overall survival (OS). Tumor assessments were evaluated by investigators per immune-related RECIST.  RESULTS  At data cutoff, 108 patients with previously treated endometrial carcinoma were enrolled, with a median follow-up of 18.7 months. The CLEXN17 was 38.0% (95% CI, 28.8% to 47.8%). Among subgroups, the GYFVC94 (95% CI) was 63.6% (30.8% to 89.1%) in patients with microsatellite instability (MSI)-high tumors (n = 11) and 36.2% (26.5% to 46.7%) in patients with microsatellite-stable tumors (n = 94). For previously treated patients, regardless of tumor MSI status, the median DOR was 21.2 months (95% CI, 7.6 months to not estimable), median PFS was 7.4 months (95% CI, 5.3 to 8.7 months), and median OS was 16.7 months (15.0 months to not estimable). Grade 3 or 4 treatment-related adverse events occurred in 83/124 (66.9%) patients. CONCLUSION  Lenvatinib plus pembrolizumab showed promising  antitumor activity in patients with advanced endometrial carcinoma who have experienced disease progression after prior systemic therapy, regardless of  tumor MSI status. The combination therapy had a manageable toxicity profile. The most common adverse reactions for endometrial cancer were fatigue, hypertension, musculoskeletal pain, diarrhea, decreased appetite, hypothyroidism, nausea, stomatitis, vomiting, decreased weight, abdominal pain, headache, constipation, urinary tract infection, dysphonia, hemorrhagic events, hypomagnesemia, palmar-plantar erythrodysesthesia, dyspnea, cough, and rash.   The most common adverse reactions for endometrial cancer were fatigue, hypertension, musculoskeletal pain, diarrhea, decreased appetite, hypothyroidism, nausea, stomatitis, vomiting, decreased weight, abdominal pain, headache, constipation, urinary tract infection, dysphonia, hemorrhagic events, hypomagnesemia, palmar-plantar erythrodysesthesia, dyspnea, cough, and rash.   After much discussion, the patient would like to proceed with the plan of care. She is advised to get her blood pressure checked twice a day while on lenvatinib I will see her prior to cycle 2 of treatment

## 2019-01-24 NOTE — Assessment & Plan Note (Signed)
She understood the goals of care is strictly palliative in nature We discussed the importance of advanced directives and living will

## 2019-01-25 NOTE — Telephone Encounter (Signed)
Oral Oncology Patient Advocate Encounter  Met patient in the [place] to complete application for [employer] in an effort to reduce patient's out of pocket expense for Lenvima to $0.    Application completed and faxed to [phone].   [employer] patient assistance phone number for follow up is [phone].   This encounter will be updated until final determination.   [place] Patient [place] Phone [phone] Fax [phone] 01/25/2019    2:00 PM

## 2019-01-30 ENCOUNTER — Telehealth: Payer: Self-pay

## 2019-01-30 NOTE — Telephone Encounter (Signed)
She called and left a message to call her about treatment Friday.  Called back and told her to put cream on port prior to appts and she does not have to take any medication at home prior to treatment. She verbalized understanding.

## 2019-01-30 NOTE — Telephone Encounter (Signed)
Oral Oncology Patient Advocate Encounter  I called [employer] to follow up on the [place]. The application is in the final stages of processing and should have a determination in 2-3 business days.  I called the patient to give her this information and had to leave a voicemail.  [place] Patient [place] Phone [phone] Fax [phone] 01/30/2019   10:36 AM

## 2019-02-01 ENCOUNTER — Inpatient Hospital Stay: Payer: Medicare Other

## 2019-02-01 ENCOUNTER — Other Ambulatory Visit: Payer: Self-pay

## 2019-02-01 VITALS — BP 106/79 | HR 89 | Temp 98.7°F | Resp 18 | Wt 206.0 lb

## 2019-02-01 DIAGNOSIS — C541 Malignant neoplasm of endometrium: Secondary | ICD-10-CM | POA: Diagnosis not present

## 2019-02-01 DIAGNOSIS — C786 Secondary malignant neoplasm of retroperitoneum and peritoneum: Secondary | ICD-10-CM | POA: Diagnosis not present

## 2019-02-01 DIAGNOSIS — C773 Secondary and unspecified malignant neoplasm of axilla and upper limb lymph nodes: Secondary | ICD-10-CM

## 2019-02-01 DIAGNOSIS — Z5112 Encounter for antineoplastic immunotherapy: Secondary | ICD-10-CM | POA: Diagnosis not present

## 2019-02-01 DIAGNOSIS — Z7189 Other specified counseling: Secondary | ICD-10-CM

## 2019-02-01 DIAGNOSIS — Z923 Personal history of irradiation: Secondary | ICD-10-CM | POA: Diagnosis not present

## 2019-02-01 DIAGNOSIS — Z9221 Personal history of antineoplastic chemotherapy: Secondary | ICD-10-CM | POA: Diagnosis not present

## 2019-02-01 LAB — CBC WITH DIFFERENTIAL (CANCER CENTER ONLY)
Abs Immature Granulocytes: 0.01 10*3/uL (ref 0.00–0.07)
Basophils Absolute: 0 10*3/uL (ref 0.0–0.1)
Basophils Relative: 0 %
Eosinophils Absolute: 0.1 10*3/uL (ref 0.0–0.5)
Eosinophils Relative: 2 %
HCT: 30.8 % — ABNORMAL LOW (ref 36.0–46.0)
Hemoglobin: 10 g/dL — ABNORMAL LOW (ref 12.0–15.0)
Immature Granulocytes: 0 %
Lymphocytes Relative: 34 %
Lymphs Abs: 1.6 10*3/uL (ref 0.7–4.0)
MCH: 28.7 pg (ref 26.0–34.0)
MCHC: 32.5 g/dL (ref 30.0–36.0)
MCV: 88.3 fL (ref 80.0–100.0)
Monocytes Absolute: 0.3 10*3/uL (ref 0.1–1.0)
Monocytes Relative: 7 %
Neutro Abs: 2.7 10*3/uL (ref 1.7–7.7)
Neutrophils Relative %: 57 %
Platelet Count: 218 10*3/uL (ref 150–400)
RBC: 3.49 MIL/uL — ABNORMAL LOW (ref 3.87–5.11)
RDW: 13.2 % (ref 11.5–15.5)
WBC Count: 4.8 10*3/uL (ref 4.0–10.5)
nRBC: 0 % (ref 0.0–0.2)

## 2019-02-01 LAB — CMP (CANCER CENTER ONLY)
ALT: 14 U/L (ref 0–44)
AST: 14 U/L — ABNORMAL LOW (ref 15–41)
Albumin: 3.6 g/dL (ref 3.5–5.0)
Alkaline Phosphatase: 83 U/L (ref 38–126)
Anion gap: 12 (ref 5–15)
BUN: 32 mg/dL — ABNORMAL HIGH (ref 8–23)
CO2: 23 mmol/L (ref 22–32)
Calcium: 9.9 mg/dL (ref 8.9–10.3)
Chloride: 106 mmol/L (ref 98–111)
Creatinine: 1.44 mg/dL — ABNORMAL HIGH (ref 0.44–1.00)
GFR, Est AFR Am: 43 mL/min — ABNORMAL LOW (ref >60)
GFR, Estimated: 37 mL/min — ABNORMAL LOW (ref >60)
Glucose, Bld: 142 mg/dL — ABNORMAL HIGH (ref 70–99)
Potassium: 4 mmol/L (ref 3.5–5.1)
Sodium: 141 mmol/L (ref 135–145)
Total Bilirubin: 0.3 mg/dL (ref 0.3–1.2)
Total Protein: 7 g/dL (ref 6.5–8.1)

## 2019-02-01 LAB — TOTAL PROTEIN, URINE DIPSTICK: Protein, ur: NEGATIVE mg/dL

## 2019-02-01 MED ORDER — HEPARIN SOD (PORK) LOCK FLUSH 100 UNIT/ML IV SOLN
500.0000 [IU] | Freq: Once | INTRAVENOUS | Status: AC | PRN
  Administered 2019-02-01: 500 [IU]
  Filled 2019-02-01: qty 5

## 2019-02-01 MED ORDER — SODIUM CHLORIDE 0.9% FLUSH
10.0000 mL | Freq: Once | INTRAVENOUS | Status: AC
  Administered 2019-02-01: 10 mL
  Filled 2019-02-01: qty 10

## 2019-02-01 MED ORDER — SODIUM CHLORIDE 0.9 % IV SOLN
Freq: Once | INTRAVENOUS | Status: AC
  Administered 2019-02-01: 15:00:00 via INTRAVENOUS
  Filled 2019-02-01: qty 250

## 2019-02-01 MED ORDER — SODIUM CHLORIDE 0.9% FLUSH
10.0000 mL | INTRAVENOUS | Status: DC | PRN
  Administered 2019-02-01: 10 mL
  Filled 2019-02-01: qty 10

## 2019-02-01 MED ORDER — SODIUM CHLORIDE 0.9 % IV SOLN
200.0000 mg | Freq: Once | INTRAVENOUS | Status: AC
  Administered 2019-02-01: 200 mg via INTRAVENOUS
  Filled 2019-02-01: qty 8

## 2019-02-01 NOTE — Patient Instructions (Signed)
[facility] Discharge Instructions for Patients Receiving Chemotherapy  Today you received the following chemotherapy agents: Keytruda.  To help prevent nausea and vomiting after your treatment, we encourage you to take your nausea medication as directed.   If you develop nausea and vomiting that is not controlled by your nausea medication, call the clinic.   BELOW ARE SYMPTOMS THAT SHOULD BE REPORTED IMMEDIATELY:  *FEVER GREATER THAN 100.5 F  *CHILLS WITH OR WITHOUT FEVER  NAUSEA AND VOMITING THAT IS NOT CONTROLLED WITH YOUR NAUSEA MEDICATION  *UNUSUAL SHORTNESS OF BREATH  *UNUSUAL BRUISING OR BLEEDING  TENDERNESS IN MOUTH AND THROAT WITH OR WITHOUT PRESENCE OF ULCERS  *URINARY PROBLEMS  *BOWEL PROBLEMS  UNUSUAL RASH Items with * indicate a potential emergency and should be followed up as soon as possible.  Feel free to call the clinic should you have any questions or concerns. The clinic phone number is [phone].  Please show the CHEMO ALERT CARD at check-in to the Emergency Department and triage nurse.  Pembrolizumab injection What is this medicine? PEMBROLIZUMAB (pem broe liz ue mab) is a monoclonal antibody. It is used to treat bladder cancer, cervical cancer, endometrial cancer, esophageal cancer, head and neck cancer, hepatocellular cancer, Hodgkin lymphoma, kidney cancer, lymphoma, melanoma, Merkel cell carcinoma, lung cancer, stomach cancer, urothelial cancer, and cancers that have a certain genetic condition. This medicine may be used for other purposes; ask your health care provider or pharmacist if you have questions. COMMON BRAND NAME(S): Keytruda What should I tell my health care provider before I take this medicine? They need to know if you have any of these conditions:  diabetes  immune system problems  inflammatory bowel disease  liver disease  lung or breathing disease  lupus  received or scheduled to receive an organ  transplant or a stem-cell transplant that uses donor stem cells  an unusual or allergic reaction to pembrolizumab, other medicines, foods, dyes, or preservatives  pregnant or trying to get pregnant  breast-feeding How should I use this medicine? This medicine is for infusion into a vein. It is given by a health care professional in a hospital or clinic setting. A special MedGuide will be given to you before each treatment. Be sure to read this information carefully each time. Talk to your pediatrician regarding the use of this medicine in children. While this drug may be prescribed for selected conditions, precautions do apply. Overdosage: If you think you have taken too much of this medicine contact a poison control center or emergency room at once. NOTE: This medicine is only for you. Do not share this medicine with others. What if I miss a dose? It is important not to miss your dose. Call your doctor or health care professional if you are unable to keep an appointment. What may interact with this medicine? Interactions have not been studied. Give your health care provider a list of all the medicines, herbs, non-prescription drugs, or dietary supplements you use. Also tell them if you smoke, drink alcohol, or use illegal drugs. Some items may interact with your medicine. This list may not describe all possible interactions. Give your health care provider a list of all the medicines, herbs, non-prescription drugs, or dietary supplements you use. Also tell them if you smoke, drink alcohol, or use illegal drugs. Some items may interact with your medicine. What should I watch for while using this medicine? Your condition will be monitored carefully while you are receiving this medicine. You may need   blood work done while you are taking this medicine. Do not become pregnant while taking this medicine or for 4 months after stopping it. Women should inform their doctor if they wish to become  pregnant or think they might be pregnant. There is a potential for serious side effects to an unborn child. Talk to your health care professional or pharmacist for more information. Do not breast-feed an infant while taking this medicine or for 4 months after the last dose. What side effects may I notice from receiving this medicine? Side effects that you should report to your doctor or health care professional as soon as possible:  allergic reactions like skin rash, itching or hives, swelling of the face, lips, or tongue  bloody or black, tarry  breathing problems  changes in vision  chest pain  chills  confusion  constipation  cough  diarrhea  dizziness or feeling faint or lightheaded  fast or irregular heartbeat  fever  flushing  hair loss  joint pain  low blood counts - this medicine may decrease the number of white blood cells, red blood cells and platelets. You may be at increased risk for infections and bleeding.  muscle pain  muscle weakness  persistent headache  redness, blistering, peeling or loosening of the skin, including inside the mouth  signs and symptoms of high blood sugar such as dizziness; dry mouth; dry skin; fruity breath; nausea; stomach pain; increased hunger or thirst; increased urination  signs and symptoms of kidney injury like trouble passing urine or change in the amount of urine  signs and symptoms of liver injury like dark urine, light-colored stools, loss of appetite, nausea, right upper belly pain, yellowing of the eyes or skin  sweating  swollen lymph nodes  weight loss Side effects that usually do not require medical attention (report to your doctor or health care professional if they continue or are bothersome):  decreased appetite  muscle pain  tiredness This list may not describe all possible side effects. Call your doctor for medical advice about side effects. You may report side effects to FDA at  1-800-FDA-1088. Where should I keep my medicine? This drug is given in a hospital or clinic and will not be stored at home. NOTE: This sheet is a summary. It may not cover all possible information. If you have questions about this medicine, talk to your doctor, pharmacist, or health care provider.  2020 Elsevier/Gold Standard (2018-05-01 13:46:58)  

## 2019-02-01 NOTE — Telephone Encounter (Signed)
Oral Oncology Johnson Advocate Encounter  I called [employer] to follow up on the [facility] assistance application.  They will have an approval for the Johnson by the end of today and reach out to her to schedule shipment.  I will update and call Johnson when I have the approval letter.  Krista Johnson [place] Phone [phone] Fax [phone] 02/01/2019   2:24 PM

## 2019-02-02 LAB — T4: T4, Total: 7.5 ug/dL (ref 4.5–12.0)

## 2019-02-04 ENCOUNTER — Other Ambulatory Visit: Payer: Medicare Other

## 2019-02-04 ENCOUNTER — Ambulatory Visit: Payer: Medicare Other | Admitting: Hematology and Oncology

## 2019-02-04 ENCOUNTER — Telehealth: Payer: Self-pay | Admitting: *Deleted

## 2019-02-04 LAB — TSH: TSH: 1.358 u[IU]/mL (ref 0.308–3.960)

## 2019-02-05 NOTE — Telephone Encounter (Signed)
Oral Chemotherapy Pharmacist Encounter   I spoke with patient for overview of: Lenvima (lenvatinib) for the treatment of metastatic endometrial cancer in conjunction with pembrolizumab, planned duration until disease progression or unacceptable drug toxicity.  Patient had discussed Lenvima with the pharmacist and manufacturer contracted pharmacy and did not want me to review the medication in detail.  We did discuss that patient will take Lenvima 10mg  capsules, 1 capsule (10mg ) by mouth once daily, with or without food, at approximately the same time each day.  Lenvima start date: ~02/09/19 [name] will be delivered to patient's home this Friday, 02/08/19   I did inform patient patient that she may experience fatigue or an increase in blood pressure while on this medication regimen. Patient also informed that both the lenvatinib and the pembrolizumab may cause diarrhea, but through different mechanisms that would require different treatment strategies. She will alert the office to any change in bowel habits.  Patient inquired if she would need to start taking dexamethasone the night before and the morning of her IV treatment. I informed patient this is used as a premedication for her previous paclitaxel and would not be used in combination with current regimen. Patient also informed we would be avoiding the use of steroids while she is on immunotherapy as it may counteract the benefit of immune checkpoint inhibition.  Insurance authorization for [name] has been obtained. Test claim at the pharmacy revealed copayment $3000 for 1st fill of Lenvima.  This is not affordable to patient. There are no foundation copayment grants available for patient's diagnosis.  Oral oncology patient advocate has been working with patient to complete manufacturer assistance application with [employer] to receive her medication at $0 out of pocket cost from the drug company. Patient was able to connect with [employer]  earlier today and has scheduled her 1st shipment of [name]. Patient stated it will arrive to her mailing address this Friday.  Patient informed the enrollment for free medication from the manufacturer would end at the end of this year (04/18/19) and that we would need to reapply in Jan for her to receive her medication in 2021 through this program.  All questions answered.  [name] voiced understanding.   Patient knows to call the office with questions or concerns.  Johny Drilling, PharmD, BCPS, BCOP  02/05/2019   1:00 PM Oral Oncology Clinic [phone]

## 2019-02-05 NOTE — Telephone Encounter (Signed)
Oral Oncology Patient Advocate Encounter  I followed up with [employer] today on the [facility] patient assistance application.  [employer] has reached out to the patient several times and left voicmails including this morning. They have her scheduled to call again this afternoon. They will not fully approve and ship the medication until they speak to the patient.  The patient is aware and was give the phone number to [place] today.  This encounter will be updated until final determination.  [place] Patient [place] Phone [phone] Fax [phone] 02/05/2019   10:40 AM

## 2019-02-05 NOTE — Telephone Encounter (Signed)
Oral Chemotherapy Pharmacist Encounter   I called patient today with offer for initial counseling for lenvatinib. Patient has not yet received her 1st fill of lenvatinib and has not contacted the manufacturer to complete patient assistance application process. Patient was provided the phone number to [facility] [phone]) and instructed she needs to contact them in order to receive her 1st shipment of medication. She will contact them and then call me back for initial counseling.  Patient with several questions about the manufacturer assistance process. She was instructed to ask [place] for clarification. She also expressed dissatisfaction with the cost of the lenvatinib and this pharmacist's handling of her questions.  Johny Drilling, PharmD, BCPS, BCOP  02/05/2019   10:27 AM Oral Oncology Clinic [phone]

## 2019-02-07 ENCOUNTER — Telehealth: Payer: Self-pay

## 2019-02-07 NOTE — Telephone Encounter (Signed)
Pls advise the patient to start as soon as she has received the pill

## 2019-02-07 NOTE — Telephone Encounter (Signed)
Oral Oncology Patient Advocate Krista Johnson patient assistance application will expire 04/18/19.  I spoke to Derricka at [place] and she informed me that I could submit this same exact application that was just approved on 10/21 since we are so close to the renewal peiord. [employer] will start accepting renewal applications on [id number]  I will make a copay of this application and it is ready to fax to [place] during the renewal period.  I spoke to the patient and gave her this information. She verbalized understanding and great appreciation.  Oral oncology clinic will continue to follow.  [place] Patient [place] Phone [phone] Fax [phone] 02/07/2019   11:09 AM

## 2019-02-07 NOTE — Telephone Encounter (Signed)
Oral Oncology Patient Advocate Encounter  Received notification from [facility] Patient Assistance program that patient has been successfully enrolled into their program to receive Lenvima from the manufacturer at $0 out of pocket until 04/18/19.    I called and spoke with patient.  She knows we will have to re-apply.   Patient knows to call the office with questions or concerns.   Oral Oncology Clinic will continue to follow.  [place] Patient [place] Phone [phone] Fax [phone] 02/07/2019    11:06 AM

## 2019-02-07 NOTE — Telephone Encounter (Signed)
Telephone call to patient she will start medication tomorrow when UPS drops it off.  Patient reports she has tolerated her first infusion well. She has been able continue normal daily activities without any difficulty. Patient understands to call this office with any questions or concerns once she starts [place].

## 2019-02-08 NOTE — Telephone Encounter (Signed)
Oral Oncology Patient Advocate Encounter  The patient called me to confirm that she did receive her Lenvima in the mail today, 10/23.  She will take today then start tomorrow taking it each morning.  [place] Patient [place] Phone [phone] Fax [phone] 02/08/2019   2:40 PM

## 2019-02-14 ENCOUNTER — Telehealth: Payer: Self-pay

## 2019-02-14 NOTE — Telephone Encounter (Signed)
-----   Message from [name], MD sent at 02/14/2019  8:13 AM EDT ----- Regarding: can you check on her how she is doing with lenvima so far?

## 2019-02-14 NOTE — Telephone Encounter (Signed)
Called and given below message. She verbalized understanding. She is going great with no complaints. She wants to thank Dr. Alvy Bimler for everything. She is grateful for all of the assistance. Instructed to call the office if needed.

## 2019-02-22 ENCOUNTER — Ambulatory Visit: Payer: Medicare Other

## 2019-02-22 ENCOUNTER — Other Ambulatory Visit: Payer: Medicare Other

## 2019-02-22 ENCOUNTER — Inpatient Hospital Stay: Payer: Medicare Other

## 2019-02-22 ENCOUNTER — Encounter: Payer: Self-pay | Admitting: Hematology and Oncology

## 2019-02-22 ENCOUNTER — Inpatient Hospital Stay (HOSPITAL_BASED_OUTPATIENT_CLINIC_OR_DEPARTMENT_OTHER): Payer: Medicare Other | Admitting: Hematology and Oncology

## 2019-02-22 ENCOUNTER — Telehealth: Payer: Self-pay | Admitting: Hematology and Oncology

## 2019-02-22 ENCOUNTER — Inpatient Hospital Stay: Payer: Medicare Other | Attending: Gynecology

## 2019-02-22 ENCOUNTER — Other Ambulatory Visit: Payer: Self-pay

## 2019-02-22 VITALS — BP 142/90

## 2019-02-22 DIAGNOSIS — Z79899 Other long term (current) drug therapy: Secondary | ICD-10-CM | POA: Insufficient documentation

## 2019-02-22 DIAGNOSIS — Z7189 Other specified counseling: Secondary | ICD-10-CM

## 2019-02-22 DIAGNOSIS — Z9221 Personal history of antineoplastic chemotherapy: Secondary | ICD-10-CM | POA: Diagnosis not present

## 2019-02-22 DIAGNOSIS — I10 Essential (primary) hypertension: Secondary | ICD-10-CM | POA: Diagnosis not present

## 2019-02-22 DIAGNOSIS — Z5112 Encounter for antineoplastic immunotherapy: Secondary | ICD-10-CM | POA: Diagnosis not present

## 2019-02-22 DIAGNOSIS — C773 Secondary and unspecified malignant neoplasm of axilla and upper limb lymph nodes: Secondary | ICD-10-CM

## 2019-02-22 DIAGNOSIS — I700 Atherosclerosis of aorta: Secondary | ICD-10-CM | POA: Diagnosis not present

## 2019-02-22 DIAGNOSIS — Z794 Long term (current) use of insulin: Secondary | ICD-10-CM | POA: Diagnosis not present

## 2019-02-22 DIAGNOSIS — Z923 Personal history of irradiation: Secondary | ICD-10-CM | POA: Diagnosis not present

## 2019-02-22 DIAGNOSIS — C541 Malignant neoplasm of endometrium: Secondary | ICD-10-CM

## 2019-02-22 DIAGNOSIS — D638 Anemia in other chronic diseases classified elsewhere: Secondary | ICD-10-CM | POA: Insufficient documentation

## 2019-02-22 LAB — CMP (CANCER CENTER ONLY)
ALT: 15 U/L (ref 0–44)
AST: 15 U/L (ref 15–41)
Albumin: 3.9 g/dL (ref 3.5–5.0)
Alkaline Phosphatase: 93 U/L (ref 38–126)
Anion gap: 12 (ref 5–15)
BUN: 28 mg/dL — ABNORMAL HIGH (ref 8–23)
CO2: 26 mmol/L (ref 22–32)
Calcium: 10 mg/dL (ref 8.9–10.3)
Chloride: 104 mmol/L (ref 98–111)
Creatinine: 1.47 mg/dL — ABNORMAL HIGH (ref 0.44–1.00)
GFR, Est AFR Am: 42 mL/min — ABNORMAL LOW (ref >60)
GFR, Estimated: 36 mL/min — ABNORMAL LOW (ref >60)
Glucose, Bld: 119 mg/dL — ABNORMAL HIGH (ref 70–99)
Potassium: 3.7 mmol/L (ref 3.5–5.1)
Sodium: 142 mmol/L (ref 135–145)
Total Bilirubin: 0.3 mg/dL (ref 0.3–1.2)
Total Protein: 7.3 g/dL (ref 6.5–8.1)

## 2019-02-22 LAB — CBC WITH DIFFERENTIAL (CANCER CENTER ONLY)
Abs Immature Granulocytes: 0.01 10*3/uL (ref 0.00–0.07)
Basophils Absolute: 0 10*3/uL (ref 0.0–0.1)
Basophils Relative: 0 %
Eosinophils Absolute: 0.1 10*3/uL (ref 0.0–0.5)
Eosinophils Relative: 1 %
HCT: 34.3 % — ABNORMAL LOW (ref 36.0–46.0)
Hemoglobin: 11.4 g/dL — ABNORMAL LOW (ref 12.0–15.0)
Immature Granulocytes: 0 %
Lymphocytes Relative: 39 %
Lymphs Abs: 1.7 10*3/uL (ref 0.7–4.0)
MCH: 28.2 pg (ref 26.0–34.0)
MCHC: 33.2 g/dL (ref 30.0–36.0)
MCV: 84.9 fL (ref 80.0–100.0)
Monocytes Absolute: 0.3 10*3/uL (ref 0.1–1.0)
Monocytes Relative: 6 %
Neutro Abs: 2.3 10*3/uL (ref 1.7–7.7)
Neutrophils Relative %: 54 %
Platelet Count: 185 10*3/uL (ref 150–400)
RBC: 4.04 MIL/uL (ref 3.87–5.11)
RDW: 12.9 % (ref 11.5–15.5)
WBC Count: 4.3 10*3/uL (ref 4.0–10.5)
nRBC: 0 % (ref 0.0–0.2)

## 2019-02-22 LAB — TOTAL PROTEIN, URINE DIPSTICK: Protein, ur: <6 mg/dL — AB

## 2019-02-22 MED ORDER — SODIUM CHLORIDE 0.9% FLUSH
10.0000 mL | Freq: Once | INTRAVENOUS | Status: AC
  Administered 2019-02-22: 10 mL
  Filled 2019-02-22: qty 10

## 2019-02-22 MED ORDER — SODIUM CHLORIDE 0.9 % IV SOLN
200.0000 mg | Freq: Once | INTRAVENOUS | Status: AC
  Administered 2019-02-22: 16:00:00 200 mg via INTRAVENOUS
  Filled 2019-02-22: qty 8

## 2019-02-22 MED ORDER — SODIUM CHLORIDE 0.9% FLUSH
10.0000 mL | INTRAVENOUS | Status: DC | PRN
  Administered 2019-02-22: 10 mL
  Filled 2019-02-22: qty 10

## 2019-02-22 MED ORDER — SODIUM CHLORIDE 0.9 % IV SOLN
Freq: Once | INTRAVENOUS | Status: AC
  Administered 2019-02-22: 16:00:00 via INTRAVENOUS
  Filled 2019-02-22: qty 250

## 2019-02-22 MED ORDER — HEPARIN SOD (PORK) LOCK FLUSH 100 UNIT/ML IV SOLN
500.0000 [IU] | Freq: Once | INTRAVENOUS | Status: AC | PRN
  Administered 2019-02-22: 500 [IU]
  Filled 2019-02-22: qty 5

## 2019-02-22 NOTE — Assessment & Plan Note (Signed)
She is not symptomatic Observe only 

## 2019-02-22 NOTE — Telephone Encounter (Signed)
Confirmed November/December appointments with patient.

## 2019-02-22 NOTE — Assessment & Plan Note (Signed)
So far, she tolerated treatment well without major side effects The lymphadenopathy are not palpable on exam today She will continue treatment as scheduled Her blood pressure is noted to be mildly elevated If her blood pressure continues to trend high and she has signs of proteinuria, we might have to add an additional blood pressure medication to treat the side effects from treatment

## 2019-02-22 NOTE — Patient Instructions (Signed)
[facility] Discharge Instructions for Patients Receiving Chemotherapy  Today you received the following chemotherapy agents:  Keytruda.  To help prevent nausea and vomiting after your treatment, we encourage you to take your nausea medication as directed.   If you develop nausea and vomiting that is not controlled by your nausea medication, call the clinic.   BELOW ARE SYMPTOMS THAT SHOULD BE REPORTED IMMEDIATELY:  *FEVER GREATER THAN 100.5 F  *CHILLS WITH OR WITHOUT FEVER  NAUSEA AND VOMITING THAT IS NOT CONTROLLED WITH YOUR NAUSEA MEDICATION  *UNUSUAL SHORTNESS OF BREATH  *UNUSUAL BRUISING OR BLEEDING  TENDERNESS IN MOUTH AND THROAT WITH OR WITHOUT PRESENCE OF ULCERS  *URINARY PROBLEMS  *BOWEL PROBLEMS  UNUSUAL RASH Items with * indicate a potential emergency and should be followed up as soon as possible.  Feel free to call the clinic should you have any questions or concerns. The clinic phone number is [phone].  Please show the CHEMO ALERT CARD at check-in to the Emergency Department and triage nurse.    

## 2019-02-22 NOTE — Assessment & Plan Note (Signed)
Her blood pressure is elevated She is not symptomatic We will observe closely and adjust the blood pressure medication as needed This is likely worsened by Krista Johnson

## 2019-02-22 NOTE — Progress Notes (Signed)
[facility] OFFICE PROGRESS NOTE  Patient Care Team: Jonathon Jordan, MD as PCP - General (Family Medicine)  ASSESSMENT & PLAN:  Endometrial cancer [place]) So far, she tolerated treatment well without major side effects The lymphadenopathy are not palpable on exam today She will continue treatment as scheduled Her blood pressure is noted to be mildly elevated If her blood pressure continues to trend high and she has signs of proteinuria, we might have to add an additional blood pressure medication to treat the side effects from treatment  Essential hypertension Her blood pressure is elevated She is not symptomatic We will observe closely and adjust the blood pressure medication as needed This is likely worsened by Lenvima  Anemia, chronic disease She is not symptomatic.  Observe only.     No orders of the defined types were placed in this encounter.   INTERVAL HISTORY: Please see below for problem oriented charting. 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 Leo Grosser who obtained an endometrial biopsy showing poorly differentiated endometrial carcinoma and negative endocervical curettage   12/12/2017 Imaging   MAMMOGRAM FINDINGS: In the right axilla, a possible mass warrants further evaluation. In the left breast, no findings suspicious for malignancy.  Images were processed  with CAD.  IMPRESSION: Further evaluation is suggested for possible mass in the right axilla.    12/24/2017 Imaging   Ct scan chest, abdomen and pelvis 1. Marked thickening of the endometrium (42 mm) compatible with known primary endometrial malignancy. No evidence of extrauterine invasion. 2. No pelvic or retroperitoneal adenopathy. 3. Right middle lobe 4 mm solid pulmonary nodule, for which follow-up chest CT is advised in 3-6 months. 4. Several findings that are equivocal for distant metastatic disease. Vaguely nodular heterogeneous hyperenhancement in the peripheral right liver lobe, which could represent benign transient perfusional phenomena, with underlying liver lesions not entirely excluded. Mildly sclerotic T12 vertebral lesion. Mildly enlarged right axillary lymph node. The best single test to further evaluate these findings would be a PET-CT. Alternative tests include bone scan or thoracic MRI without and with IV contrast for the T12 osseous lesion, MRI abdomen without and with IV contrast for the liver findings, and diagnostic mammographic evaluation for the right axillary node. 5.  Aortic Atherosclerosis (ICD10-I70.0).    01/09/2018 PET scan   1. Moderate hypermetabolism corresponding to enlarging axillary node since 12/22/2017. Highly suspicious for an atypical distribution of metastatic disease. 2. No hypermetabolism to suggest hepatic or T12 osseous metastasis. 3. Hypermetabolic endometrial primary.   01/23/2018 Initial Diagnosis   Endometrial cancer ([place])   01/23/2018 Pathology Results   1. Lymph node, sentinel, biopsy, left external iliac - NO CARCINOMA IDENTIFIED IN ONE LYMPH NODE (0/1) - SEE COMMENT 2. Lymph nodes, regional resection, right para aortic - NO CARCINOMA IDENTIFIED IN FOUR LYMPH NODES (0/4) - SEE COMMENT 3. Lymph nodes, regional resection, right pelvic - NO CARCINOMA IDENTIFIED IN EIGHT LYMPH NODES (0/8) -  SEE COMMENT 4. Cul-de-sac biopsy - METASTATIC  CARCINOMA 5. Uterus +/- tubes/ovaries, neoplastic, cervix, bilateral fallopian tubes and ovaries UTERUS: - MIXED SEROUS AND ENDOMETRIOID CARCINOMA - SEROSAL IMPLANTS PRESENT - LYMPHOVASCULAR SPACE INVASION PRESENT - LEIOMYOMATA (1.5 CM; LARGEST) - SEE ONCOLOGY TABLE AND COMMENT BELOW CERVIX: - BENIGN NABOTHIAN CYSTS - NO CARCINOMA IDENTIFIED BILATERAL OVARIES: - METASTATIC CARCINOMA PRESENT ON OVARIAN SURFACE BILATERAL FALLOPIAN TUBES: - INTRALUMINAL CARCINOMA Microscopic Comment 1. -3. Cytokeratin AE1/3 was performed on the sentinel lymph nodes to exclude micrometastasis. There is no evidence of metastatic carcinoma by immunohistochemistry. 5. 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%) Histologic Grade: N/A Myometrial invasion: Estimated less than 50% myometrial invasion (0.3 cm of myometrium involved; 1.4 cm measured thickness) Uterine Serosa Involvement: Present Cervical stromal involvement: Not identified Extent of involvement of other organs: - Fallopian tube (left within the lumen) - Ovary, left (surface involvement) - Cul-de-sac Lymphovascular invasion: Present Regional Lymph Nodes: Examined: 1 Sentinel 12 Non-sentinel 13 Total Lymph nodes with metastasis: 0 Isolated tumor cells (< 0.2 mm): 0 Micrometastasis: (> 0.2 mm and < 2.0 mm): 0 Macrometastasis: (> 2.0 mm): 0 Extracapsular extension: N/A Tumor block for ancillary studies: 5E, 5B MMR / MSI testing: Pending will be reported separately Pathologic Stage Classification (pTNM, AJCC 8th edition): pT3a, pN0 FIGO Stage: IIIA COMMENT: There is tumor present on the surface of the left ovary and within the lumen of the left fallopian tube. The carcinoma appears to be mixed with the largest  component being high grade serous carcinoma. Dr. Lyndon Code reviewed the case and agrees with the above diagnosis.   01/23/2018 Surgery   Surgeon: Donaciano Eva   Operation: Robotic-assisted laparoscopic total hysterectomy with bilateral salpingoophorectomy, SLN injection, mapping and biopsy, right pelvic and para-aortic lymphadenectomy  Operative Findings:  : 10-12cm bulky uterus with frank serosal involvement on posterior cul de sac peritoneum and anterior peritoneum which adhesed the bladder to the anterior uterus. Unilateral mapping on left pelvis. No grossly suspicious nodes. Normal omentum and diaphragms.    02/01/2018 Pathology Results   Lymph node, needle/core biopsy, right axilla - METASTATIC CARCINOMA - SEE COMMENT Microscopic Comment The neoplastic cells are positive for cytokeratin 7 and Pax-8 but negative for cytokeratin 5/6, cytokeratin 20, Gata-3, p63, p53 and GCDFP. Overall, the immunoprofile is consistent with metastasis from the patient's known gynecologic carcinoma.   02/01/2018 Procedure   Ultrasound-guided core biopsies of a suspicious right axillary lymph node.   02/21/2018 - 06/13/2018 Chemotherapy   The patient had carboplatin & Taxol x 6   03/26/2018 - 04/30/2018 Radiation Therapy   Radiation treatment dates:   03/26/18, 04/02/18, 04/19/18, 04/23/18 04/30/18  Site/dose: proximal Vagina, 6 Gy in 5 fractions for a total dose of 30 Gy    04/26/2018 PET scan   1. Interval resection of the hypermetabolic endometrial primary. No discernible hypermetabolic pelvic sidewall or peritoneal metastases. 2. Stable hypermetabolic bilateral axillary lymphadenopathy. The degree of hypermetabolism in these lymph nodes remains highly suspicious for neoplasm. 3. Interval development of low level FDG uptake in 2 stable, small lymph nodes in the left groin region. This may be reactive, but close attention recommended to exclude metastatic involvement. 4. Stable non hypermetabolic  low-density liver lesions and the mixed lucent and sclerotic T12 lesion is stable without hypermetabolism today.   07/09/2018 Imaging   Status post hysterectomy.  Mild  axillary lymphadenopathy, improved. Nodal metastases not excluded.  Irregular wall thickening involving the anterior bladder, possibly reflecting radiation cystitis versus tumor.  Additional stable findings as above, including a 5 mm right middle lobe nodule and stable sclerosis involving the T12 vertebral body.   10/10/2018 Imaging   1. Stable exam. No findings within the abdomen or pelvis to suggest metastatic disease. 2. Unchanged appearance of sclerosis involving the T12 vertebra. 3.  Aortic Atherosclerosis (ICD10-I70.0).   10/10/2018 Imaging   Ct abdomen and pelvis 1. Stable exam. No findings within the abdomen or pelvis to suggest metastatic disease. 2. Unchanged appearance of sclerosis involving the T12 vertebra. 3.  Aortic Atherosclerosis (ICD10-I70.0).   01/16/2019 PET scan   1. Enlargement and increased activity in the left axillary, right axillary, and left inguinal adenopathy compatible with progressive malignancy. 2. Stable 4 mm right middle lobe subpleural nodule, not appreciably hypermetabolic. 3. Other imaging findings of potential clinical significance: Right kidney lower pole cyst. Aortic Atherosclerosis (ICD10-I70.0). Prominent stool throughout the colon favors constipation. Mild chronic left maxillary sinusitis.     02/01/2019 -  Chemotherapy   The patient had pembrolizumab (KEYTRUDA) 200 mg in sodium chloride 0.9 % 50 mL chemo infusion, 200 mg, Intravenous, Once, 1 of 6 cycles Administration: 200 mg (02/01/2019)  for chemotherapy treatment.      REVIEW OF SYSTEMS:   Constitutional: Denies fevers, chills or abnormal weight loss Eyes: Denies blurriness of vision Ears, nose, mouth, throat, and face: Denies mucositis or sore throat Respiratory: Denies cough, dyspnea or wheezes Cardiovascular:  Denies palpitation, chest discomfort or lower extremity swelling Gastrointestinal:  Denies nausea, heartburn or change in bowel habits Skin: Denies abnormal skin rashes Lymphatics: Denies new lymphadenopathy or easy bruising Neurological:Denies numbness, tingling or new weaknesses Behavioral/Psych: Mood is stable, no new changes  All other systems were reviewed with the patient and are negative.  I have reviewed the past medical history, past surgical history, social history and family history with the patient and they are unchanged from previous note.  ALLERGIES:  is allergic to sulfa antibiotics and sulfamethoxazole.  MEDICATIONS:  Current Outpatient Medications  Medication Sig Dispense Refill  . Insulin Isophane & Regular Human (NOVOLIN 70/30 FLEXPEN) (70-30) 100 UNIT/ML PEN Inject 20 Units into the skin daily with breakfast. also pen needles 1/day 15 mL 11  . ketoconazole (NIZORAL) 2 % cream Apply to web space twice a day for 1 week then daily for 1 month (Patient taking differently: Apply 1 application topically daily as needed for irritation. Apply to web space twice a day for 1 week then daily for 1 month) 15 g 0  . lenvatinib 10 mg daily dose (LENVIMA, 10 MG DAILY DOSE,) capsule Take 1 capsule (10 mg total) by mouth daily. 30 capsule 11  . lidocaine-prilocaine (EMLA) cream Apply to affected area once 30 g 3  . lidocaine-prilocaine (EMLA) cream Apply to affected area once 30 g 3  . lisinopril-hydrochlorothiazide (PRINZIDE,ZESTORETIC) 20-12.5 MG tablet Take 1 tablet by mouth daily. (Patient taking differently: Take 1 tablet by mouth every morning. ) 30 tablet 11  . ondansetron (ZOFRAN) 8 MG tablet Take 1 tablet (8 mg total) by mouth daily. 30 tablet 1  . OneTouch Delica Lancets 35T MISC 1 each by Other route 2 (two) times daily. Use to monitor glucose levels BID; E11.65 100 each 2  . ONETOUCH VERIO test strip Use to check blood sugar TID 100 each 3  . RELION PEN NEEDLES 32G X 4 MM MISC  See admin  instructions.  11  . simvastatin (ZOCOR) 10 MG tablet Take 10 mg by mouth at bedtime.     . Vitamin D, Ergocalciferol, 2000 units CAPS Take 2,000 Units by mouth daily.   0   No current facility-administered medications for this visit.     PHYSICAL EXAMINATION: ECOG PERFORMANCE STATUS: 1 - Symptomatic but completely ambulatory  Vitals:   02/22/19 1444  BP: (!) 140/97  Pulse: 89  Resp: 18  Temp: 98.9 F (37.2 C)  SpO2: 100%   Filed Weights   02/22/19 1444  Weight: 204 lb 6.4 oz (92.7 kg)    GENERAL:alert, no distress and comfortable SKIN: skin color, texture, turgor are normal, no rashes or significant lesions EYES: normal, Conjunctiva are pink and non-injected, sclera clear OROPHARYNX:no exudate, no erythema and lips, buccal mucosa, and tongue normal  NECK: supple, thyroid normal size, non-tender, without nodularity LYMPH:  no palpable lymphadenopathy in the cervical, axillary or inguinal LUNGS: clear to auscultation and percussion with normal breathing effort HEART: regular rate & rhythm and no murmurs and no lower extremity edema ABDOMEN:abdomen soft, non-tender and normal bowel sounds Musculoskeletal:no cyanosis of digits and no clubbing  NEURO: alert & oriented x 3 with fluent speech, no focal motor/sensory deficits  LABORATORY DATA:  I have reviewed the data as listed    Component Value Date/Time   NA 141 02/01/2019 1407   K 4.0 02/01/2019 1407   CL 106 02/01/2019 1407   CO2 23 02/01/2019 1407   GLUCOSE 142 (H) 02/01/2019 1407   BUN 32 (H) 02/01/2019 1407   CREATININE 1.44 (H) 02/01/2019 1407   CALCIUM 9.9 02/01/2019 1407   PROT 7.0 02/01/2019 1407   ALBUMIN 3.6 02/01/2019 1407   AST 14 (L) 02/01/2019 1407   ALT 14 02/01/2019 1407   ALKPHOS 83 02/01/2019 1407   BILITOT 0.3 02/01/2019 1407   GFRNONAA 37 (L) 02/01/2019 1407   GFRAA 43 (L) 02/01/2019 1407    No results found for: SPEP, UPEP  Lab Results  Component Value Date   WBC 4.3  02/22/2019   NEUTROABS 2.3 02/22/2019   HGB 11.4 (L) 02/22/2019   HCT 34.3 (L) 02/22/2019   MCV 84.9 02/22/2019   PLT 185 02/22/2019      Chemistry      Component Value Date/Time   NA 141 02/01/2019 1407   K 4.0 02/01/2019 1407   CL 106 02/01/2019 1407   CO2 23 02/01/2019 1407   BUN 32 (H) 02/01/2019 1407   CREATININE 1.44 (H) 02/01/2019 1407      Component Value Date/Time   CALCIUM 9.9 02/01/2019 1407   ALKPHOS 83 02/01/2019 1407   AST 14 (L) 02/01/2019 1407   ALT 14 02/01/2019 1407   BILITOT 0.3 02/01/2019 1407       All questions were answered. The patient knows to call the clinic with any problems, questions or concerns. No barriers to learning was detected.  I spent 15 minutes counseling the patient face to face. The total time spent in the appointment was 20 minutes and more than 50% was on counseling and review of test results  [name], MD 02/22/2019 2:48 PM

## 2019-02-22 NOTE — Patient Instructions (Signed)

## 2019-02-23 LAB — T4: T4, Total: 7.4 ug/dL (ref 4.5–12.0)

## 2019-02-25 LAB — TSH: TSH: 2.228 u[IU]/mL (ref 0.308–3.960)

## 2019-03-11 ENCOUNTER — Other Ambulatory Visit: Payer: Self-pay | Admitting: Endocrinology

## 2019-03-11 ENCOUNTER — Telehealth: Payer: Self-pay | Admitting: Pharmacist

## 2019-03-11 NOTE — Telephone Encounter (Signed)
Oral Chemotherapy Pharmacist Encounter   Spoke with patient today to follow up regarding patient's oral chemotherapy medication: (lenvatinib)for the treatment of metastatic endometrial cancerin conjunction with pembrolizumab, planned durationuntil disease progression or unacceptable drug toxicity.  Original Start date of oral chemotherapy: 02/08/2019  Pt reports 0 tablets/doses of Lenvima 10mg  capsules, 1 capsule (10mg ) by mouth once daily, with or without food, at approximately the same time each day, missed in the last month.   Pt reports the following side effects:   She endorses some soreness in her right hand, comes and goes, manageable  Patient denies GI upset and states she has good energy  Patient states a nurse through the [employer] patient assistance program calls her weekly for check-in and she really enjoys this service  Pertinent labs reviewed: OK for continued use.  Other Issues:  We discussed reenrollment with [employer] for calendar year 2021.  Patient informed that we have the entire application that we need in order to submit for reenrollment and there is nothing else that the patient was due at this time. Patient informed that we will continue to update her until determination from the manufacturer. Application for [employer] will be updated in a separate encounter.  All questions answered. Patient knows to call the office with questions or concerns.  Krista Johnson, PharmD, BCPS, BCOP  03/11/2019 11:18 AM Oral Oncology Clinic [phone]

## 2019-03-15 ENCOUNTER — Inpatient Hospital Stay: Payer: Medicare Other

## 2019-03-15 ENCOUNTER — Other Ambulatory Visit: Payer: Self-pay

## 2019-03-15 VITALS — BP 143/91 | HR 92 | Temp 98.3°F | Resp 20

## 2019-03-15 DIAGNOSIS — C541 Malignant neoplasm of endometrium: Secondary | ICD-10-CM | POA: Diagnosis not present

## 2019-03-15 DIAGNOSIS — Z9221 Personal history of antineoplastic chemotherapy: Secondary | ICD-10-CM | POA: Diagnosis not present

## 2019-03-15 DIAGNOSIS — Z7189 Other specified counseling: Secondary | ICD-10-CM

## 2019-03-15 DIAGNOSIS — D638 Anemia in other chronic diseases classified elsewhere: Secondary | ICD-10-CM | POA: Diagnosis not present

## 2019-03-15 DIAGNOSIS — Z5112 Encounter for antineoplastic immunotherapy: Secondary | ICD-10-CM | POA: Diagnosis not present

## 2019-03-15 DIAGNOSIS — C773 Secondary and unspecified malignant neoplasm of axilla and upper limb lymph nodes: Secondary | ICD-10-CM

## 2019-03-15 DIAGNOSIS — Z923 Personal history of irradiation: Secondary | ICD-10-CM | POA: Diagnosis not present

## 2019-03-15 DIAGNOSIS — I10 Essential (primary) hypertension: Secondary | ICD-10-CM | POA: Diagnosis not present

## 2019-03-15 LAB — CMP (CANCER CENTER ONLY)
ALT: 15 U/L (ref 0–44)
AST: 15 U/L (ref 15–41)
Albumin: 3.8 g/dL (ref 3.5–5.0)
Alkaline Phosphatase: 91 U/L (ref 38–126)
Anion gap: 12 (ref 5–15)
BUN: 33 mg/dL — ABNORMAL HIGH (ref 8–23)
CO2: 24 mmol/L (ref 22–32)
Calcium: 9.9 mg/dL (ref 8.9–10.3)
Chloride: 104 mmol/L (ref 98–111)
Creatinine: 1.5 mg/dL — ABNORMAL HIGH (ref 0.44–1.00)
GFR, Est AFR Am: 41 mL/min — ABNORMAL LOW (ref >60)
GFR, Estimated: 35 mL/min — ABNORMAL LOW (ref >60)
Glucose, Bld: 124 mg/dL — ABNORMAL HIGH (ref 70–99)
Potassium: 3.7 mmol/L (ref 3.5–5.1)
Sodium: 140 mmol/L (ref 135–145)
Total Bilirubin: 0.4 mg/dL (ref 0.3–1.2)
Total Protein: 7.2 g/dL (ref 6.5–8.1)

## 2019-03-15 LAB — CBC WITH DIFFERENTIAL (CANCER CENTER ONLY)
Abs Immature Granulocytes: 0.01 10*3/uL (ref 0.00–0.07)
Basophils Absolute: 0 10*3/uL (ref 0.0–0.1)
Basophils Relative: 0 %
Eosinophils Absolute: 0 10*3/uL (ref 0.0–0.5)
Eosinophils Relative: 1 %
HCT: 37.7 % (ref 36.0–46.0)
Hemoglobin: 12.3 g/dL (ref 12.0–15.0)
Immature Granulocytes: 0 %
Lymphocytes Relative: 34 %
Lymphs Abs: 1.5 10*3/uL (ref 0.7–4.0)
MCH: 28.1 pg (ref 26.0–34.0)
MCHC: 32.6 g/dL (ref 30.0–36.0)
MCV: 86.1 fL (ref 80.0–100.0)
Monocytes Absolute: 0.2 10*3/uL (ref 0.1–1.0)
Monocytes Relative: 6 %
Neutro Abs: 2.6 10*3/uL (ref 1.7–7.7)
Neutrophils Relative %: 59 %
Platelet Count: 184 10*3/uL (ref 150–400)
RBC: 4.38 MIL/uL (ref 3.87–5.11)
RDW: 13.2 % (ref 11.5–15.5)
WBC Count: 4.4 10*3/uL (ref 4.0–10.5)
nRBC: 0 % (ref 0.0–0.2)

## 2019-03-15 LAB — TOTAL PROTEIN, URINE DIPSTICK: Protein, ur: NEGATIVE mg/dL

## 2019-03-15 LAB — TSH: TSH: 4.441 u[IU]/mL — ABNORMAL HIGH (ref 0.308–3.960)

## 2019-03-15 MED ORDER — SODIUM CHLORIDE 0.9% FLUSH
10.0000 mL | Freq: Once | INTRAVENOUS | Status: AC
  Administered 2019-03-15: 10 mL
  Filled 2019-03-15: qty 10

## 2019-03-15 MED ORDER — HEPARIN SOD (PORK) LOCK FLUSH 100 UNIT/ML IV SOLN
500.0000 [IU] | Freq: Once | INTRAVENOUS | Status: AC | PRN
  Administered 2019-03-15: 500 [IU]
  Filled 2019-03-15: qty 5

## 2019-03-15 MED ORDER — SODIUM CHLORIDE 0.9% FLUSH
10.0000 mL | INTRAVENOUS | Status: DC | PRN
  Administered 2019-03-15: 10 mL
  Filled 2019-03-15: qty 10

## 2019-03-15 MED ORDER — SODIUM CHLORIDE 0.9 % IV SOLN
Freq: Once | INTRAVENOUS | Status: AC
  Administered 2019-03-15: 14:00:00 via INTRAVENOUS
  Filled 2019-03-15: qty 250

## 2019-03-15 MED ORDER — SODIUM CHLORIDE 0.9 % IV SOLN
200.0000 mg | Freq: Once | INTRAVENOUS | Status: AC
  Administered 2019-03-15: 200 mg via INTRAVENOUS
  Filled 2019-03-15: qty 8

## 2019-03-15 NOTE — Patient Instructions (Signed)
[facility] Discharge Instructions for Patients Receiving Chemotherapy  Today you received the following chemotherapy agents:  Keytruda.  To help prevent nausea and vomiting after your treatment, we encourage you to take your nausea medication as directed.   If you develop nausea and vomiting that is not controlled by your nausea medication, call the clinic.   BELOW ARE SYMPTOMS THAT SHOULD BE REPORTED IMMEDIATELY:  *FEVER GREATER THAN 100.5 F  *CHILLS WITH OR WITHOUT FEVER  NAUSEA AND VOMITING THAT IS NOT CONTROLLED WITH YOUR NAUSEA MEDICATION  *UNUSUAL SHORTNESS OF BREATH  *UNUSUAL BRUISING OR BLEEDING  TENDERNESS IN MOUTH AND THROAT WITH OR WITHOUT PRESENCE OF ULCERS  *URINARY PROBLEMS  *BOWEL PROBLEMS  UNUSUAL RASH Items with * indicate a potential emergency and should be followed up as soon as possible.  Feel free to call the clinic should you have any questions or concerns. The clinic phone number is [phone].  Please show the CHEMO ALERT CARD at check-in to the Emergency Department and triage nurse.    

## 2019-03-16 LAB — T4: T4, Total: 6.7 ug/dL (ref 4.5–12.0)

## 2019-03-26 ENCOUNTER — Ambulatory Visit (INDEPENDENT_AMBULATORY_CARE_PROVIDER_SITE_OTHER): Payer: Medicare Other | Admitting: Podiatry

## 2019-03-26 ENCOUNTER — Other Ambulatory Visit: Payer: Self-pay

## 2019-03-26 ENCOUNTER — Encounter: Payer: Self-pay | Admitting: Podiatry

## 2019-03-26 DIAGNOSIS — G620 Drug-induced polyneuropathy: Secondary | ICD-10-CM

## 2019-03-26 DIAGNOSIS — M79674 Pain in right toe(s): Secondary | ICD-10-CM | POA: Diagnosis not present

## 2019-03-26 DIAGNOSIS — M79675 Pain in left toe(s): Secondary | ICD-10-CM

## 2019-03-26 DIAGNOSIS — E119 Type 2 diabetes mellitus without complications: Secondary | ICD-10-CM

## 2019-03-26 DIAGNOSIS — Z794 Long term (current) use of insulin: Secondary | ICD-10-CM

## 2019-03-26 DIAGNOSIS — T451X5A Adverse effect of antineoplastic and immunosuppressive drugs, initial encounter: Secondary | ICD-10-CM

## 2019-03-26 DIAGNOSIS — B351 Tinea unguium: Secondary | ICD-10-CM | POA: Diagnosis not present

## 2019-03-27 ENCOUNTER — Telehealth: Payer: Self-pay | Admitting: Family Medicine

## 2019-03-27 ENCOUNTER — Telehealth: Payer: Self-pay

## 2019-03-27 NOTE — Telephone Encounter (Signed)
error 

## 2019-03-27 NOTE — Telephone Encounter (Signed)
Faxed completed form to [facility] at [facility] [place] Phone [phone] Fax [phone] 03/27/2019 4:15 PM

## 2019-03-29 ENCOUNTER — Other Ambulatory Visit: Payer: Self-pay

## 2019-03-31 ENCOUNTER — Encounter: Payer: Self-pay | Admitting: Podiatry

## 2019-03-31 NOTE — Progress Notes (Signed)
Subjective:  Krista Johnson presents to clinic today for follow up of painful mycotic toenails b/l.  Pain is aggravated when wearing enclosed shoe gear and relieved with periodic professional debridement.  She has h/o chemotherapy induced neuropathy.  Patient voices no new pedal concerns on today's visit.  Medications reviewed in chart.  Allergies  Allergen Reactions  . Sulfa Antibiotics Nausea Only  . Sulfamethoxazole Nausea Only   Objective:  Physical Examination:  Vascular Examination: Capillary refill time immediate b/l.  Palpable DP/PT pulses b/l.  Digital hair decreased b/l.  No edema noted b/l.  Skin temperature gradient WNL b/l.  Dermatological Examination: Skin with normal turgor, texture and tone b/l.  No open wounds b/l.  No interdigital macerations noted b/l.  Elongated, thick, discolored brittle toenails with subungual debris and pain on dorsal palpation of nailbeds 1-5 b/l.  Musculoskeletal Examination: Muscle strength 5/5 to all muscle groups b/l.  No pain, crepitus or joint discomfort with active/passive ROM.  Neurological Examination: Sensation intact 5/5 b/l with 10 gram monofilament.  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 Continue diabetic foot care principles daily. 2. Toenails 1-5 b/l were debrided in length and girth without iatrogenic laceration. 3. Continue soft, supportive shoe gear daily. 4. Report any pedal injuries to medical professional. 5. Follow up 3 months. 6. Patient/POA to call should there be a question/concern in there interim.

## 2019-04-02 ENCOUNTER — Ambulatory Visit (INDEPENDENT_AMBULATORY_CARE_PROVIDER_SITE_OTHER): Payer: Medicare Other | Admitting: Endocrinology

## 2019-04-02 ENCOUNTER — Telehealth: Payer: Self-pay

## 2019-04-02 ENCOUNTER — Encounter: Payer: Self-pay | Admitting: Endocrinology

## 2019-04-02 VITALS — BP 128/82 | HR 103 | Ht 66.0 in | Wt 203.0 lb

## 2019-04-02 DIAGNOSIS — E1121 Type 2 diabetes mellitus with diabetic nephropathy: Secondary | ICD-10-CM | POA: Diagnosis not present

## 2019-04-02 DIAGNOSIS — Z794 Long term (current) use of insulin: Secondary | ICD-10-CM

## 2019-04-02 DIAGNOSIS — E119 Type 2 diabetes mellitus without complications: Secondary | ICD-10-CM | POA: Insufficient documentation

## 2019-04-02 DIAGNOSIS — N1831 Chronic kidney disease, stage 3a: Secondary | ICD-10-CM

## 2019-04-02 LAB — POCT GLYCOSYLATED HEMOGLOBIN (HGB A1C): Hemoglobin A1C: 7.4 % — AB (ref 4.0–5.6)

## 2019-04-02 MED ORDER — NOVOLIN N FLEXPEN 100 UNIT/ML ~~LOC~~ SUPN: 30.0000 [IU] | PEN_INJECTOR | SUBCUTANEOUS | 3 refills | Status: DC

## 2019-04-02 MED ORDER — NOVOLIN 70/30 FLEXPEN (70-30) 100 UNIT/ML ~~LOC~~ SUPN: 30.0000 [IU] | PEN_INJECTOR | Freq: Every day | SUBCUTANEOUS | 0 refills | Status: DC

## 2019-04-02 MED ORDER — ONETOUCH VERIO VI STRP: 1.0000 | ORAL_STRIP | Freq: Three times a day (TID) | 3 refills | Status: DC

## 2019-04-02 NOTE — Telephone Encounter (Signed)
OK, please continue the same until you can get the new insulin

## 2019-04-02 NOTE — Patient Instructions (Addendum)
Please change the insulin to "NPH," 30 units each monring When you get your next steroid shot, call if the blood sugar goes over 200.   check your blood sugar twice a day.  vary the time of day when you check, between before the 3 meals, and at bedtime.  also check if you have symptoms of your blood sugar being too high or too low.  please keep a record of the readings and bring it to your next appointment here (or you can bring the meter itself).  You can write it on any piece of paper.  please call us sooner if your blood sugar goes below 70, or if you have a lot of readings over 200. On this type of insulin schedule, you should eat meals on a regular schedule (especially lunch).  If a meal is missed or significantly delayed, your blood sugar could go low.   Please come back for a follow-up appointment in 2 months.

## 2019-04-02 NOTE — Telephone Encounter (Signed)
Pt is asking for a refill of her old Rx. Please advise what her previous dosage was of the 70/30

## 2019-04-02 NOTE — Telephone Encounter (Signed)
Patient called in stating that her insurance will not pay for the new Rx sent to pharmacy she will not be able to get new Rx until February per pharmacy and so she will still need to take the old insulin     Insulin NPH, Human,, Isophane, (NOVOLIN N FLEXPEN) 100 UNIT/ML Kiwkpen   Insulin Isophane & Regular Human (NOVOLIN 70/30 FLEXPEN) (70-30) 100 UNIT/ML PEN [id number] DISCONTINUED     Please advise

## 2019-04-02 NOTE — Telephone Encounter (Signed)
OK, I have sent a prescription to [facility], to refill

## 2019-04-02 NOTE — Telephone Encounter (Signed)
Insulin Isophane & Regular Human (NOVOLIN 70/30 FLEXPEN) (70-30) 100 UNIT/ML PEN 10 pen 0 04/02/2019    Sig - Route: Inject 30 Units into the skin daily with breakfast. - Subcutaneous   Sent to pharmacy as: Insulin Isophane & Regular Human (NOVOLIN 70/30 FLEXPEN) (70-30) 100 UNIT/ML PEN   E-Prescribing Status: Receipt confirmed by pharmacy (04/02/2019 12:54 PM EST)

## 2019-04-02 NOTE — Progress Notes (Signed)
Subjective:    Patient ID: Krista Johnson, female    DOB: 19-Feb-1951, 68 y.o.   MRN: [id number]  HPI Pt returns for f/u of diabetes mellitus:  DM type: Insulin-requiring type 2.   Dx'ed: [id number] Complications: renal insuff Therapy: insulin since mid-2019 GDM: never (G0) DKA: never Severe hypoglycemia: never Pancreatitis: never Pancreatic imaging: normal on 2019 CT.   Other: she takes qd insulin, after poor results with multiple daily injections; She is finished with her chemo. Interval history:she brings her meter with her cbg's which I have reviewed today.  cbg varies from 55-249.  It is still in general lowest in the afternoon.  Pt says she seldom misses the insulin. She takes 30 units qam.   Pt also has vit-D def and Hyperparathyroidism (prob a combination of primary and secondary).  She takes vit-D, 2000 units per day.   No recent steroids.  Past Medical History:  Diagnosis Date  . Arthritis    right knee--- last cortisone injection 04/ 2019  . CKD (chronic kidney disease)   . Diabetes mellitus without complication [facility])    endocrinologist-  dr Loanne Drilling  . Endometrial cancer ([place])   . History of colon polyps   . Hyperlipidemia   . Hyperparathyroidism ([place])   . Hypertension     Past Surgical History:  Procedure Laterality Date  . COLONOSCOPY  last one 12-25-2017  . IR IMAGING GUIDED PORT INSERTION  02/20/2018  . ROBOTIC ASSISTED TOTAL HYSTERECTOMY WITH BILATERAL SALPINGO OOPHERECTOMY N/A 01/23/2018   Procedure: XI ROBOTIC ASSISTED TOTAL HYSTERECTOMY WITH BILATERAL SALPINGO OOPHORECTOMY;  Surgeon: Everitt Amber, MD;  Location: [facility];  Service: Gynecology;  Laterality: N/A;  . SENTINEL NODE BIOPSY N/A 01/23/2018   Procedure: SENTINEL NODE BIOPSY;  Surgeon: Everitt Amber, MD;  Location: [facility];  Service: Gynecology;  Laterality: N/A;    Social History   Socioeconomic History  . Marital status: Single    Spouse name: Not on file  . Number of children: 0  . Years of education:  Not on file  . Highest education level: Not on file  Occupational History  . Occupation: retired Education officer, museum  Tobacco Use  . Smoking status: Never Smoker  . Smokeless tobacco: Never Used  Substance and Sexual Activity  . Alcohol use: Not Currently    Alcohol/week: 0.0 standard drinks  . Drug use: Never  . Sexual activity: Not on file  Other Topics Concern  . Not on file  Social History Narrative  . Not on file   Social Determinants of Health   Financial Resource Strain:   . Difficulty of Paying Living Expenses: Not on file  Food Insecurity:   . Worried About Charity fundraiser in the Last Year: Not on file  . Ran Out of Food in the Last Year: Not on file  Transportation Needs:   . Lack of Transportation (Medical): Not on file  . Lack of Transportation (Non-Medical): Not on file  Physical Activity:   . Days of Exercise per Week: Not on file  . Minutes of Exercise per Session: Not on file  Stress:   . Feeling of Stress : Not on file  Social Connections:   . Frequency of Communication with Friends and Family: Not on file  . Frequency of Social Gatherings with Friends and Family: Not on file  . Attends Religious Services: Not on file  . Active Member of Clubs or Organizations: Not on file  . Attends Archivist Meetings: Not on  file  . Marital Status: Not on file  Intimate Partner Violence:   . Fear of Current or Ex-Partner: Not on file  . Emotionally Abused: Not on file  . Physically Abused: Not on file  . Sexually Abused: Not on file    Current Outpatient Medications on File Prior to Visit  Medication Sig Dispense Refill  . ketoconazole (NIZORAL) 2 % cream Apply to web space twice a day for 1 week then daily for 1 month (Patient taking differently: Apply 1 application topically daily as needed for irritation. Apply to web space twice a day for 1 week then daily for 1 month) 15 g 0  . lenvatinib 10 mg daily dose (LENVIMA, 10 MG DAILY DOSE,) capsule Take 1  capsule (10 mg total) by mouth daily. 30 capsule 11  . lidocaine-prilocaine (EMLA) cream Apply to affected area once 30 g 3  . lidocaine-prilocaine (EMLA) cream Apply to affected area once 30 g 3  . lisinopril-hydrochlorothiazide (PRINZIDE,ZESTORETIC) 20-12.5 MG tablet Take 1 tablet by mouth daily. (Patient taking differently: Take 1 tablet by mouth every morning. ) 30 tablet 11  . ondansetron (ZOFRAN) 8 MG tablet Take 1 tablet (8 mg total) by mouth daily. 30 tablet 1  . OneTouch Delica Lancets 99991111 MISC 1 each by Other route 2 (two) times daily. Use to monitor glucose levels BID; E11.65 100 each 2  . RELION PEN NEEDLES 32G X 4 MM MISC USE AS DIRECTED 50 each 0  . simvastatin (ZOCOR) 10 MG tablet Take 10 mg by mouth at bedtime.     . Vitamin D, Ergocalciferol, 2000 units CAPS Take 2,000 Units by mouth daily.   0   No current facility-administered medications on file prior to visit.    Allergies  Allergen Reactions  . Sulfa Antibiotics Nausea Only  . Sulfamethoxazole Nausea Only    Family History  Problem Relation Age of Onset  . Diabetes Mother   . Hypertension Mother   . Diabetes Sister   . Hypertension Sister   . Diabetes Maternal Uncle   . Colon cancer Paternal Aunt   . Diabetes Paternal Aunt   . Stomach cancer Neg Hx   . Rectal cancer Neg Hx   . Esophageal cancer Neg Hx   . Colon polyps Neg Hx     BP 128/82 (BP Location: Left Arm, Patient Position: Sitting, Cuff Size: Large)   Pulse (!) 103   Ht 5\' 6"  (1.676 m)   Wt 203 lb (92.1 kg)   SpO2 96%   BMI 32.77 kg/m    Review of Systems Denies LOC.      Objective:   Physical Exam VITAL SIGNS:  See vs page GENERAL: no distress Pulses: dorsalis pedis intact bilat.   MSK: no deformity of the feet CV: no leg edema Skin:  no ulcer on the feet.  normal color and temp on the feet. Neuro: sensation is intact to touch on the feet  Lab Results  Component Value Date   CREATININE 1.50 (H) 03/15/2019   BUN 33 (H) 03/15/2019    NA 140 03/15/2019   K 3.7 03/15/2019   CL 104 03/15/2019   CO2 24 03/15/2019   Lab Results  Component Value Date   HGBA1C 7.4 (A) 04/02/2019       Assessment & Plan:  Insulin-requiring type 2 DM: this is the best control this pt should aim for, given this regimen, which does match insulin to her changing needs throughout the day. Renal insuff: in this  setting, she needs a faster-acting qam insulin.  Hypoglycemia: this limits aggressiveness of glycemic control.    Patient Instructions  Please change the insulin to "NPH," 30 units each monring When you get your next steroid shot, call if the blood sugar goes over 200.   check your blood sugar twice a day.  vary the time of day when you check, between before the 3 meals, and at bedtime.  also check if you have symptoms of your blood sugar being too high or too low.  please keep a record of the readings and bring it to your next appointment here (or you can bring the meter itself).  You can write it on any piece of paper.  please call us sooner if your blood sugar goes below 70, or if you have a lot of readings over 200. On this type of insulin schedule, you should eat meals on a regular schedule (especially lunch).  If a meal is missed or significantly delayed, your blood sugar could go low.   Please come back for a follow-up appointment in 2 months.

## 2019-04-02 NOTE — Telephone Encounter (Signed)
Routing message to Dr. Loanne Drilling for him to review and address.

## 2019-04-05 ENCOUNTER — Inpatient Hospital Stay: Payer: Medicare Other | Attending: Gynecology | Admitting: Hematology and Oncology

## 2019-04-05 ENCOUNTER — Encounter: Payer: Self-pay | Admitting: Hematology and Oncology

## 2019-04-05 ENCOUNTER — Inpatient Hospital Stay: Payer: Medicare Other

## 2019-04-05 ENCOUNTER — Other Ambulatory Visit: Payer: Self-pay

## 2019-04-05 VITALS — BP 130/91 | HR 110 | Temp 97.8°F | Resp 18 | Ht 66.0 in | Wt 204.4 lb

## 2019-04-05 VITALS — BP 122/89 | HR 109

## 2019-04-05 DIAGNOSIS — Z9221 Personal history of antineoplastic chemotherapy: Secondary | ICD-10-CM | POA: Diagnosis not present

## 2019-04-05 DIAGNOSIS — I10 Essential (primary) hypertension: Secondary | ICD-10-CM

## 2019-04-05 DIAGNOSIS — I700 Atherosclerosis of aorta: Secondary | ICD-10-CM | POA: Diagnosis not present

## 2019-04-05 DIAGNOSIS — Z7189 Other specified counseling: Secondary | ICD-10-CM

## 2019-04-05 DIAGNOSIS — N183 Chronic kidney disease, stage 3 unspecified: Secondary | ICD-10-CM | POA: Diagnosis not present

## 2019-04-05 DIAGNOSIS — Z79899 Other long term (current) drug therapy: Secondary | ICD-10-CM | POA: Diagnosis not present

## 2019-04-05 DIAGNOSIS — I129 Hypertensive chronic kidney disease with stage 1 through stage 4 chronic kidney disease, or unspecified chronic kidney disease: Secondary | ICD-10-CM | POA: Diagnosis not present

## 2019-04-05 DIAGNOSIS — C541 Malignant neoplasm of endometrium: Secondary | ICD-10-CM

## 2019-04-05 DIAGNOSIS — C773 Secondary and unspecified malignant neoplasm of axilla and upper limb lymph nodes: Secondary | ICD-10-CM

## 2019-04-05 DIAGNOSIS — Z794 Long term (current) use of insulin: Secondary | ICD-10-CM | POA: Insufficient documentation

## 2019-04-05 DIAGNOSIS — E039 Hypothyroidism, unspecified: Secondary | ICD-10-CM

## 2019-04-05 DIAGNOSIS — Z923 Personal history of irradiation: Secondary | ICD-10-CM | POA: Diagnosis not present

## 2019-04-05 DIAGNOSIS — E038 Other specified hypothyroidism: Secondary | ICD-10-CM | POA: Diagnosis not present

## 2019-04-05 DIAGNOSIS — Z5112 Encounter for antineoplastic immunotherapy: Secondary | ICD-10-CM | POA: Diagnosis not present

## 2019-04-05 LAB — CMP (CANCER CENTER ONLY)
ALT: 15 U/L (ref 0–44)
AST: 17 U/L (ref 15–41)
Albumin: 3.8 g/dL (ref 3.5–5.0)
Alkaline Phosphatase: 86 U/L (ref 38–126)
Anion gap: 10 (ref 5–15)
BUN: 26 mg/dL — ABNORMAL HIGH (ref 8–23)
CO2: 26 mmol/L (ref 22–32)
Calcium: 10.2 mg/dL (ref 8.9–10.3)
Chloride: 105 mmol/L (ref 98–111)
Creatinine: 1.41 mg/dL — ABNORMAL HIGH (ref 0.44–1.00)
GFR, Est AFR Am: 44 mL/min — ABNORMAL LOW (ref >60)
GFR, Estimated: 38 mL/min — ABNORMAL LOW (ref >60)
Glucose, Bld: 133 mg/dL — ABNORMAL HIGH (ref 70–99)
Potassium: 3.8 mmol/L (ref 3.5–5.1)
Sodium: 141 mmol/L (ref 135–145)
Total Bilirubin: 0.4 mg/dL (ref 0.3–1.2)
Total Protein: 7.4 g/dL (ref 6.5–8.1)

## 2019-04-05 LAB — CBC WITH DIFFERENTIAL (CANCER CENTER ONLY)
Abs Immature Granulocytes: 0.01 10*3/uL (ref 0.00–0.07)
Basophils Absolute: 0 10*3/uL (ref 0.0–0.1)
Basophils Relative: 0 %
Eosinophils Absolute: 0 10*3/uL (ref 0.0–0.5)
Eosinophils Relative: 1 %
HCT: 39.1 % (ref 36.0–46.0)
Hemoglobin: 13 g/dL (ref 12.0–15.0)
Immature Granulocytes: 0 %
Lymphocytes Relative: 32 %
Lymphs Abs: 1.5 10*3/uL (ref 0.7–4.0)
MCH: 28 pg (ref 26.0–34.0)
MCHC: 33.2 g/dL (ref 30.0–36.0)
MCV: 84.1 fL (ref 80.0–100.0)
Monocytes Absolute: 0.2 10*3/uL (ref 0.1–1.0)
Monocytes Relative: 5 %
Neutro Abs: 2.9 10*3/uL (ref 1.7–7.7)
Neutrophils Relative %: 62 %
Platelet Count: 204 10*3/uL (ref 150–400)
RBC: 4.65 MIL/uL (ref 3.87–5.11)
RDW: 13.2 % (ref 11.5–15.5)
WBC Count: 4.6 10*3/uL (ref 4.0–10.5)
nRBC: 0 % (ref 0.0–0.2)

## 2019-04-05 LAB — TSH: TSH: 5.944 u[IU]/mL — ABNORMAL HIGH (ref 0.308–3.960)

## 2019-04-05 LAB — TOTAL PROTEIN, URINE DIPSTICK: Protein, ur: NEGATIVE mg/dL

## 2019-04-05 MED ORDER — SODIUM CHLORIDE 0.9 % IV SOLN
Freq: Once | INTRAVENOUS | Status: AC
  Filled 2019-04-05: qty 250

## 2019-04-05 MED ORDER — SODIUM CHLORIDE 0.9 % IV SOLN
200.0000 mg | Freq: Once | INTRAVENOUS | Status: AC
  Administered 2019-04-05: 200 mg via INTRAVENOUS
  Filled 2019-04-05: qty 8

## 2019-04-05 MED ORDER — LEVOTHYROXINE SODIUM 25 MCG PO TABS: 25.0000 ug | ORAL_TABLET | Freq: Every day | ORAL | 11 refills | Status: DC

## 2019-04-05 MED ORDER — SODIUM CHLORIDE 0.9% FLUSH
10.0000 mL | INTRAVENOUS | Status: DC | PRN
  Administered 2019-04-05: 10 mL
  Filled 2019-04-05: qty 10

## 2019-04-05 MED ORDER — SODIUM CHLORIDE 0.9% FLUSH
10.0000 mL | Freq: Once | INTRAVENOUS | Status: AC
  Administered 2019-04-05: 10 mL
  Filled 2019-04-05: qty 10

## 2019-04-05 MED ORDER — HEPARIN SOD (PORK) LOCK FLUSH 100 UNIT/ML IV SOLN
500.0000 [IU] | Freq: Once | INTRAVENOUS | Status: AC | PRN
  Administered 2019-04-05: 500 [IU]
  Filled 2019-04-05: qty 5

## 2019-04-05 NOTE — Progress Notes (Signed)
Verbal order from Dr. Alvy Bimler: okay to treat patient with elevated HR.

## 2019-04-05 NOTE — Assessment & Plan Note (Signed)
She has stable chronic kidney disease We will monitor carefully

## 2019-04-05 NOTE — Assessment & Plan Note (Signed)
She is aware that Lenvima can cause exacerbation of hypertension during treatment I recommend her to check her blood pressure twice a day We will monitor carefully for proteinuria

## 2019-04-05 NOTE — Progress Notes (Signed)
[facility] OFFICE PROGRESS NOTE  Patient Care Team: Jonathon Jordan, MD as PCP - General (Family Medicine)  ASSESSMENT & PLAN:  Endometrial cancer [facility]) Clinically, she has positive response to treatment We will proceed with treatment as scheduled I plan to order CT imaging next month for objective assessment of response to therapy  Acquired hypothyroidism She has acquired hypothyroidism secondary to side effects of Keytruda I recommend starting her on low-dose Synthroid We will be monitoring her TSH carefully  Chronic kidney disease (CKD), stage III (moderate) ([place]) She has stable chronic kidney disease We will monitor carefully  Essential hypertension She is aware that Lenvima can cause exacerbation of hypertension during treatment I recommend her to check her blood pressure twice a day We will monitor carefully for proteinuria   Orders Placed This Encounter  Procedures  . CT Chest W Contrast    Standing Status:   Future    Standing Expiration Date:   04/04/2020    Order Specific Question:   If indicated for the ordered procedure, I authorize the administration of contrast media per Radiology protocol    Answer:   Yes    Order Specific Question:   Preferred imaging location?    Answer:   [facility]    Order Specific Question:   Radiology Contrast Protocol - do NOT remove file path    Answer:   \\[email or web address]  . CT Abdomen Pelvis W Contrast    Standing Status:   Future    Standing Expiration Date:   04/04/2020    Order Specific Question:   If indicated for the ordered procedure, I authorize the administration of contrast media per Radiology protocol    Answer:   Yes    Order Specific Question:   Preferred imaging location?    Answer:   [facility]    Order Specific Question:   Radiology Contrast Protocol - do NOT remove file path    Answer:   [email or web address]    INTERVAL  HISTORY: Please see below for problem oriented charting. 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 Leo Grosser who obtained an endometrial biopsy showing poorly differentiated endometrial carcinoma and negative endocervical curettage   12/12/2017 Imaging   MAMMOGRAM FINDINGS: In the right axilla, a possible mass warrants further evaluation. In the left breast, no findings suspicious for malignancy.  Images were processed with CAD.  IMPRESSION: Further evaluation is suggested for possible mass in the right axilla.    12/24/2017 Imaging   Ct scan chest, abdomen and pelvis 1. Marked thickening of the endometrium (42 mm) compatible with known primary endometrial malignancy. No evidence of extrauterine invasion. 2. No pelvic or retroperitoneal adenopathy. 3. Right middle lobe 4 mm solid pulmonary nodule, for which follow-up chest CT is advised in 3-6 months. 4. Several findings that are equivocal for distant metastatic disease. Vaguely nodular heterogeneous hyperenhancement in the peripheral right liver lobe,  which could represent benign transient perfusional phenomena, with underlying liver lesions not entirely excluded. Mildly sclerotic T12 vertebral lesion. Mildly enlarged right axillary lymph node. The best single test to further evaluate these findings would be a PET-CT.  Alternative tests include bone scan or thoracic MRI without and with IV contrast for the T12 osseous lesion, MRI abdomen without and with IV contrast for the liver findings, and diagnostic mammographic evaluation for the right axillary node. 5.  Aortic Atherosclerosis (ICD10-I70.0).    01/09/2018 PET scan   1. Moderate hypermetabolism corresponding to enlarging axillary node since 12/22/2017. Highly suspicious for an atypical distribution of metastatic disease. 2. No hypermetabolism to suggest hepatic or T12 osseous metastasis. 3. Hypermetabolic endometrial primary.   01/23/2018 Initial Diagnosis   Endometrial cancer ([place])   01/23/2018 Pathology Results   1. Lymph node, sentinel, biopsy, left external iliac - NO CARCINOMA IDENTIFIED IN ONE LYMPH NODE (0/1) - SEE COMMENT 2. Lymph nodes, regional resection, right para aortic - NO CARCINOMA IDENTIFIED IN FOUR LYMPH NODES (0/4) - SEE COMMENT 3. Lymph nodes, regional resection, right pelvic - NO CARCINOMA IDENTIFIED IN EIGHT LYMPH NODES (0/8) - SEE COMMENT 4. Cul-de-sac biopsy - METASTATIC CARCINOMA 5. Uterus +/- tubes/ovaries, neoplastic, cervix, bilateral fallopian tubes and ovaries UTERUS: - MIXED SEROUS AND ENDOMETRIOID CARCINOMA - SEROSAL IMPLANTS PRESENT - LYMPHOVASCULAR SPACE INVASION PRESENT - LEIOMYOMATA (1.5 CM; LARGEST) - SEE ONCOLOGY TABLE AND COMMENT BELOW CERVIX: - BENIGN NABOTHIAN CYSTS - NO CARCINOMA IDENTIFIED BILATERAL OVARIES: - METASTATIC CARCINOMA PRESENT ON OVARIAN SURFACE BILATERAL FALLOPIAN TUBES: - INTRALUMINAL CARCINOMA Microscopic Comment 1. -3. Cytokeratin AE1/3 was performed on the sentinel lymph nodes to exclude micrometastasis. There is no evidence of metastatic carcinoma by immunohistochemistry. 5. 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%) Histologic Grade: N/A Myometrial invasion: Estimated less than 50% myometrial invasion (0.3 cm of myometrium involved; 1.4 cm measured thickness) Uterine Serosa Involvement: Present Cervical stromal involvement: Not identified Extent of involvement of other organs: - Fallopian tube (left within the lumen) - Ovary, left (surface involvement) - Cul-de-sac Lymphovascular invasion: Present Regional Lymph Nodes: Examined: 1 Sentinel 12 Non-sentinel 13 Total Lymph nodes with metastasis: 0 Isolated tumor cells (< 0.2 mm): 0 Micrometastasis: (> 0.2 mm and < 2.0 mm): 0 Macrometastasis: (> 2.0 mm): 0 Extracapsular extension: N/A Tumor block for ancillary studies: 5E, 5B MMR / MSI testing: Pending will be reported separately Pathologic Stage Classification (pTNM, AJCC 8th edition): pT3a, pN0 FIGO Stage: IIIA COMMENT: There is tumor present on the surface of the left ovary and within the lumen of the left fallopian tube. The carcinoma appears to be mixed with the largest component being high grade serous carcinoma. Dr. Lyndon Code reviewed the case and agrees with the above diagnosis.   01/23/2018 Surgery   Surgeon: Donaciano Eva   Operation: Robotic-assisted laparoscopic total hysterectomy with bilateral salpingoophorectomy, SLN injection, mapping and biopsy, right pelvic and para-aortic lymphadenectomy  Operative Findings:  : 10-12cm bulky uterus with frank serosal involvement on posterior cul de sac peritoneum and anterior peritoneum which adhesed the bladder to the anterior uterus. Unilateral mapping on left pelvis. No grossly suspicious nodes. Normal omentum and diaphragms.    02/01/2018 Pathology Results   Lymph node, needle/core biopsy, right axilla - METASTATIC CARCINOMA - SEE COMMENT Microscopic Comment The neoplastic cells are positive for cytokeratin 7 and Pax-8 but negative  for cytokeratin 5/6,  cytokeratin 20, Gata-3, p63, p53 and GCDFP. Overall, the immunoprofile is consistent with metastasis from the patient's known gynecologic carcinoma.   02/01/2018 Procedure   Ultrasound-guided core biopsies of a suspicious right axillary lymph node.   02/21/2018 - 06/13/2018 Chemotherapy   The patient had carboplatin & Taxol x 6   03/26/2018 - 04/30/2018 Radiation Therapy   Radiation treatment dates:   03/26/18, 04/02/18, 04/19/18, 04/23/18 04/30/18  Site/dose: proximal Vagina, 6 Gy in 5 fractions for a total dose of 30 Gy    04/26/2018 PET scan   1. Interval resection of the hypermetabolic endometrial primary. No discernible hypermetabolic pelvic sidewall or peritoneal metastases. 2. Stable hypermetabolic bilateral axillary lymphadenopathy. The degree of hypermetabolism in these lymph nodes remains highly suspicious for neoplasm. 3. Interval development of low level FDG uptake in 2 stable, small lymph nodes in the left groin region. This may be reactive, but close attention recommended to exclude metastatic involvement. 4. Stable non hypermetabolic low-density liver lesions and the mixed lucent and sclerotic T12 lesion is stable without hypermetabolism today.   07/09/2018 Imaging   Status post hysterectomy.  Mild axillary lymphadenopathy, improved. Nodal metastases not excluded.  Irregular wall thickening involving the anterior bladder, possibly reflecting radiation cystitis versus tumor.  Additional stable findings as above, including a 5 mm right middle lobe nodule and stable sclerosis involving the T12 vertebral body.   10/10/2018 Imaging   1. Stable exam. No findings within the abdomen or pelvis to suggest metastatic disease. 2. Unchanged appearance of sclerosis involving the T12 vertebra. 3.  Aortic Atherosclerosis (ICD10-I70.0).   10/10/2018 Imaging   Ct abdomen and pelvis 1. Stable exam. No findings within the abdomen or pelvis to suggest metastatic disease. 2. Unchanged  appearance of sclerosis involving the T12 vertebra. 3.  Aortic Atherosclerosis (ICD10-I70.0).   01/16/2019 PET scan   1. Enlargement and increased activity in the left axillary, right axillary, and left inguinal adenopathy compatible with progressive malignancy. 2. Stable 4 mm right middle lobe subpleural nodule, not appreciably hypermetabolic. 3. Other imaging findings of potential clinical significance: Right kidney lower pole cyst. Aortic Atherosclerosis (ICD10-I70.0). Prominent stool throughout the colon favors constipation. Mild chronic left maxillary sinusitis.     02/01/2019 -  Chemotherapy   The patient had pembrolizumab (KEYTRUDA) 200 mg in sodium chloride 0.9 % 50 mL chemo infusion, 200 mg, Intravenous, Once, 4 of 6 cycles Administration: 200 mg (02/01/2019), 200 mg (03/15/2019), 200 mg (02/22/2019)  for chemotherapy treatment.      REVIEW OF SYSTEMS:   Constitutional: Denies fevers, chills or abnormal weight loss Eyes: Denies blurriness of vision Ears, nose, mouth, throat, and face: Denies mucositis or sore throat Respiratory: Denies cough, dyspnea or wheezes Cardiovascular: Denies palpitation, chest discomfort or lower extremity swelling Gastrointestinal:  Denies nausea, heartburn or change in bowel habits Skin: Denies abnormal skin rashes Lymphatics: Denies new lymphadenopathy or easy bruising Neurological:Denies numbness, tingling or new weaknesses Behavioral/Psych: Mood is stable, no new changes  All other systems were reviewed with the patient and are negative.  I have reviewed the past medical history, past surgical history, social history and family history with the patient and they are unchanged from previous note.  ALLERGIES:  is allergic to sulfa antibiotics and sulfamethoxazole.  MEDICATIONS:  Current Outpatient Medications  Medication Sig Dispense Refill  . Insulin Isophane & Regular Human (NOVOLIN 70/30 FLEXPEN) (70-30) 100 UNIT/ML PEN Inject 30 Units into  the skin daily with breakfast. 10 pen 0  . ketoconazole (NIZORAL)  2 % cream Apply to web space twice a day for 1 week then daily for 1 month (Patient taking differently: Apply 1 application topically daily as needed for irritation. Apply to web space twice a day for 1 week then daily for 1 month) 15 g 0  . lenvatinib 10 mg daily dose (LENVIMA, 10 MG DAILY DOSE,) capsule Take 1 capsule (10 mg total) by mouth daily. 30 capsule 11  . levothyroxine (SYNTHROID) 25 MCG tablet Take 1 tablet (25 mcg total) by mouth daily before breakfast. 30 tablet 11  . lidocaine-prilocaine (EMLA) cream Apply to affected area once 30 g 3  . lidocaine-prilocaine (EMLA) cream Apply to affected area once 30 g 3  . lisinopril-hydrochlorothiazide (PRINZIDE,ZESTORETIC) 20-12.5 MG tablet Take 1 tablet by mouth daily. (Patient taking differently: Take 1 tablet by mouth every morning. ) 30 tablet 11  . ondansetron (ZOFRAN) 8 MG tablet Take 1 tablet (8 mg total) by mouth daily. 30 tablet 1  . OneTouch Delica Lancets 16X MISC 1 each by Other route 2 (two) times daily. Use to monitor glucose levels BID; E11.65 100 each 2  . ONETOUCH VERIO test strip 1 each by Other route 3 (three) times daily. And lancets 3/day 300 each 3  . RELION PEN NEEDLES 32G X 4 MM MISC USE AS DIRECTED 50 each 0  . simvastatin (ZOCOR) 10 MG tablet Take 10 mg by mouth at bedtime.     . Vitamin D, Ergocalciferol, 2000 units CAPS Take 2,000 Units by mouth daily.   0   No current facility-administered medications for this visit.   Facility-Administered Medications Ordered in Other Visits  Medication Dose Route Frequency Provider Last Rate Last Admin  . heparin lock flush 100 unit/mL  500 Units Intracatheter Once PRN Alvy Bimler, Calandria Mullings, MD      . pembrolizumab (KEYTRUDA) 200 mg in sodium chloride 0.9 % 50 mL chemo infusion  200 mg Intravenous Once Shann Lewellyn, MD      . sodium chloride flush (NS) 0.9 % injection 10 mL  10 mL Intracatheter PRN Alvy Bimler, Medhansh Brinkmeier, MD         PHYSICAL EXAMINATION: ECOG PERFORMANCE STATUS: 1 - Symptomatic but completely ambulatory  Vitals:   04/05/19 1235  BP: (!) 130/91  Pulse: (!) 110  Resp: 18  Temp: 97.8 F (36.6 C)  SpO2: 100%   Filed Weights   04/05/19 1235  Weight: 204 lb 6.4 oz (92.7 kg)    GENERAL:alert, no distress and comfortable SKIN: skin color, texture, turgor are normal, no rashes or significant lesions EYES: normal, Conjunctiva are pink and non-injected, sclera clear OROPHARYNX:no exudate, no erythema and lips, buccal mucosa, and tongue normal  NECK: supple, thyroid normal size, non-tender, without nodularity LYMPH:  no palpable lymphadenopathy in the cervical, axillary or inguinal LUNGS: clear to auscultation and percussion with normal breathing effort HEART: regular rate & rhythm and no murmurs and no lower extremity edema ABDOMEN:abdomen soft, non-tender and normal bowel sounds Musculoskeletal:no cyanosis of digits and no clubbing  NEURO: alert & oriented x 3 with fluent speech, no focal motor/sensory deficits  LABORATORY DATA:  I have reviewed the data as listed    Component Value Date/Time   NA 141 04/05/2019 1230   K 3.8 04/05/2019 1230   CL 105 04/05/2019 1230   CO2 26 04/05/2019 1230   GLUCOSE 133 (H) 04/05/2019 1230   BUN 26 (H) 04/05/2019 1230   CREATININE 1.41 (H) 04/05/2019 1230   CALCIUM 10.2 04/05/2019 1230   PROT 7.4  04/05/2019 1230   ALBUMIN 3.8 04/05/2019 1230   AST 17 04/05/2019 1230   ALT 15 04/05/2019 1230   ALKPHOS 86 04/05/2019 1230   BILITOT 0.4 04/05/2019 1230   GFRNONAA 38 (L) 04/05/2019 1230   GFRAA 44 (L) 04/05/2019 1230    No results found for: SPEP, UPEP  Lab Results  Component Value Date   WBC 4.6 04/05/2019   NEUTROABS 2.9 04/05/2019   HGB 13.0 04/05/2019   HCT 39.1 04/05/2019   MCV 84.1 04/05/2019   PLT 204 04/05/2019      Chemistry      Component Value Date/Time   NA 141 04/05/2019 1230   K 3.8 04/05/2019 1230   CL 105 04/05/2019 1230    CO2 26 04/05/2019 1230   BUN 26 (H) 04/05/2019 1230   CREATININE 1.41 (H) 04/05/2019 1230      Component Value Date/Time   CALCIUM 10.2 04/05/2019 1230   ALKPHOS 86 04/05/2019 1230   AST 17 04/05/2019 1230   ALT 15 04/05/2019 1230   BILITOT 0.4 04/05/2019 1230     All questions were answered. The patient knows to call the clinic with any problems, questions or concerns. No barriers to learning was detected.  I spent 25 minutes counseling the patient face to face. The total time spent in the appointment was 30 minutes and more than 50% was on counseling and review of test results  [name], MD 04/05/2019 2:18 PM

## 2019-04-05 NOTE — Patient Instructions (Signed)
[facility] Discharge Instructions for Patients Receiving Chemotherapy  Today you received the following chemotherapy agents Pembrolizumab (KEYTRUDA).  To help prevent nausea and vomiting after your treatment, we encourage you to take your nausea medication as prescribed.   If you develop nausea and vomiting that is not controlled by your nausea medication, call the clinic.   BELOW ARE SYMPTOMS THAT SHOULD BE REPORTED IMMEDIATELY:  *FEVER GREATER THAN 100.5 F  *CHILLS WITH OR WITHOUT FEVER  NAUSEA AND VOMITING THAT IS NOT CONTROLLED WITH YOUR NAUSEA MEDICATION  *UNUSUAL SHORTNESS OF BREATH  *UNUSUAL BRUISING OR BLEEDING  TENDERNESS IN MOUTH AND THROAT WITH OR WITHOUT PRESENCE OF ULCERS  *URINARY PROBLEMS  *BOWEL PROBLEMS  UNUSUAL RASH Items with * indicate a potential emergency and should be followed up as soon as possible.  Feel free to call the clinic should you have any questions or concerns. The clinic phone number is [phone].  Please show the CHEMO ALERT CARD at check-in to the Emergency Department and triage nurse.  Coronavirus (COVID-19) Are you at risk?  Are you at risk for the Coronavirus (COVID-19)?  To be considered HIGH RISK for Coronavirus (COVID-19), you have to meet the following criteria:  . Traveled to [place], [place], [place], [place] or [place]; or in the [place] to [place], [place], [place], or [place]; and have fever, cough, and shortness of breath within the last 2 weeks of travel OR . Been in close contact with a person diagnosed with COVID-19 within the last 2 weeks and have fever, cough, and shortness of breath . IF YOU DO NOT MEET THESE CRITERIA, YOU ARE CONSIDERED LOW RISK FOR COVID-19.  What to do if you are HIGH RISK for COVID-19?  . If you are having a medical emergency, call 911. . Seek medical care right away. Before you go to a doctor's office, urgent care or emergency department, call ahead and tell  them about your recent travel, contact with someone diagnosed with COVID-19, and your symptoms. You should receive instructions from your physician's office regarding next steps of care.  . When you arrive at healthcare provider, tell the healthcare staff immediately you have returned from visiting [place], [place], [place], [place] or [place]; or traveled in the [place] to [place], [place], [place], or [place]; in the last two weeks or you have been in close contact with a person diagnosed with COVID-19 in the last 2 weeks.   . Tell the health care staff about your symptoms: fever, cough and shortness of breath. . After you have been seen by a medical provider, you will be either: o Tested for (COVID-19) and discharged home on quarantine except to seek medical care if symptoms worsen, and asked to  - Stay home and avoid contact with others until you get your results (4-5 days)  - Avoid travel on public transportation if possible (such as bus, train, or airplane) or o Sent to the Emergency Department by EMS for evaluation, COVID-19 testing, and possible admission depending on your condition and test results.  What to do if you are LOW RISK for COVID-19?  Reduce your risk of any infection by using the same precautions used for avoiding the common cold or flu:  . Wash your hands often with soap and warm water for at least 20 seconds.  If soap and water are not readily available, use an alcohol-based hand sanitizer with at least 60% alcohol.  . If coughing or   sneezing, cover your mouth and nose by coughing or sneezing into the elbow areas of your shirt or coat, into a tissue or into your sleeve (not your hands). . Avoid shaking hands with others and consider head nods or verbal greetings only. . Avoid touching your eyes, nose, or mouth with unwashed hands.  . Avoid close contact with people who are sick. . Avoid places or events with large numbers of people in one location, like concerts or  sporting events. . Carefully consider travel plans you have or are making. . If you are planning any travel outside or inside the [place], visit the CDC's Travelers' Health webpage for the latest health notices. . If you have some symptoms but not all symptoms, continue to monitor at home and seek medical attention if your symptoms worsen. . If you are having a medical emergency, call 911.   ADDITIONAL HEALTHCARE OPTIONS FOR PATIENTS  [facility] / e-Visit: [email or web address]         [facility]: [phone]  [facility]: [phone]                   [facility]: [phone]    

## 2019-04-05 NOTE — Assessment & Plan Note (Signed)
Clinically, she has positive response to treatment We will proceed with treatment as scheduled I plan to order CT imaging next month for objective assessment of response to therapy

## 2019-04-05 NOTE — Assessment & Plan Note (Signed)
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

## 2019-04-05 NOTE — Patient Instructions (Signed)

## 2019-04-06 LAB — T4: T4, Total: 7.7 ug/dL (ref 4.5–12.0)

## 2019-04-09 ENCOUNTER — Telehealth: Payer: Self-pay | Admitting: Hematology and Oncology

## 2019-04-09 ENCOUNTER — Inpatient Hospital Stay: Payer: Medicare Other | Admitting: Gynecologic Oncology

## 2019-04-09 NOTE — Telephone Encounter (Signed)
Scheduled appt per 12/18 sch message - pt is aware of appt date and time   

## 2019-04-25 ENCOUNTER — Ambulatory Visit (HOSPITAL_COMMUNITY)
Admission: RE | Admit: 2019-04-25 | Discharge: 2019-04-25 | Disposition: A | Discharge status: Home / Self Care | Payer: Medicare Other | Source: Ambulatory Visit | Attending: Hematology and Oncology | Admitting: Hematology and Oncology

## 2019-04-25 DIAGNOSIS — C541 Malignant neoplasm of endometrium: Secondary | ICD-10-CM | POA: Diagnosis not present

## 2019-04-25 MED ORDER — HEPARIN SOD (PORK) LOCK FLUSH 100 UNIT/ML IV SOLN
INTRAVENOUS | Status: AC
  Filled 2019-04-25: qty 5

## 2019-04-25 MED ORDER — HEPARIN SOD (PORK) LOCK FLUSH 100 UNIT/ML IV SOLN
500.0000 [IU] | Freq: Once | INTRAVENOUS | Status: AC
  Administered 2019-04-25: 500 [IU] via INTRAVENOUS

## 2019-04-25 MED ORDER — IOHEXOL 300 MG/ML  SOLN
100.0000 mL | Freq: Once | INTRAMUSCULAR | Status: AC | PRN
  Administered 2019-04-25: 09:00:00 80 mL via INTRAVENOUS

## 2019-04-25 MED ORDER — SODIUM CHLORIDE (PF) 0.9 % IJ SOLN
INTRAMUSCULAR | Status: AC
  Filled 2019-04-25: qty 50

## 2019-04-26 ENCOUNTER — Inpatient Hospital Stay: Payer: Medicare Other

## 2019-04-26 ENCOUNTER — Telehealth: Payer: Self-pay | Admitting: Hematology and Oncology

## 2019-04-26 ENCOUNTER — Encounter: Payer: Self-pay | Admitting: Hematology and Oncology

## 2019-04-26 ENCOUNTER — Inpatient Hospital Stay: Payer: Medicare Other | Attending: Gynecology

## 2019-04-26 ENCOUNTER — Inpatient Hospital Stay (HOSPITAL_BASED_OUTPATIENT_CLINIC_OR_DEPARTMENT_OTHER): Payer: Medicare Other | Admitting: Hematology and Oncology

## 2019-04-26 ENCOUNTER — Other Ambulatory Visit: Payer: Self-pay

## 2019-04-26 VITALS — HR 93

## 2019-04-26 DIAGNOSIS — E039 Hypothyroidism, unspecified: Secondary | ICD-10-CM

## 2019-04-26 DIAGNOSIS — I129 Hypertensive chronic kidney disease with stage 1 through stage 4 chronic kidney disease, or unspecified chronic kidney disease: Secondary | ICD-10-CM | POA: Diagnosis not present

## 2019-04-26 DIAGNOSIS — D61818 Other pancytopenia: Secondary | ICD-10-CM | POA: Diagnosis not present

## 2019-04-26 DIAGNOSIS — Z7189 Other specified counseling: Secondary | ICD-10-CM

## 2019-04-26 DIAGNOSIS — R05 Cough: Secondary | ICD-10-CM | POA: Insufficient documentation

## 2019-04-26 DIAGNOSIS — T451X5A Adverse effect of antineoplastic and immunosuppressive drugs, initial encounter: Secondary | ICD-10-CM | POA: Diagnosis not present

## 2019-04-26 DIAGNOSIS — N183 Chronic kidney disease, stage 3 unspecified: Secondary | ICD-10-CM | POA: Insufficient documentation

## 2019-04-26 DIAGNOSIS — Z794 Long term (current) use of insulin: Secondary | ICD-10-CM | POA: Insufficient documentation

## 2019-04-26 DIAGNOSIS — R809 Proteinuria, unspecified: Secondary | ICD-10-CM | POA: Insufficient documentation

## 2019-04-26 DIAGNOSIS — C779 Secondary and unspecified malignant neoplasm of lymph node, unspecified: Secondary | ICD-10-CM | POA: Insufficient documentation

## 2019-04-26 DIAGNOSIS — C541 Malignant neoplasm of endometrium: Secondary | ICD-10-CM

## 2019-04-26 DIAGNOSIS — Z79899 Other long term (current) drug therapy: Secondary | ICD-10-CM | POA: Insufficient documentation

## 2019-04-26 DIAGNOSIS — C773 Secondary and unspecified malignant neoplasm of axilla and upper limb lymph nodes: Secondary | ICD-10-CM

## 2019-04-26 DIAGNOSIS — Z9221 Personal history of antineoplastic chemotherapy: Secondary | ICD-10-CM | POA: Diagnosis not present

## 2019-04-26 DIAGNOSIS — I10 Essential (primary) hypertension: Secondary | ICD-10-CM | POA: Diagnosis not present

## 2019-04-26 DIAGNOSIS — Z923 Personal history of irradiation: Secondary | ICD-10-CM | POA: Diagnosis not present

## 2019-04-26 DIAGNOSIS — Z5112 Encounter for antineoplastic immunotherapy: Secondary | ICD-10-CM | POA: Insufficient documentation

## 2019-04-26 LAB — CBC WITH DIFFERENTIAL (CANCER CENTER ONLY)
Abs Immature Granulocytes: 0 10*3/uL (ref 0.00–0.07)
Basophils Absolute: 0 10*3/uL (ref 0.0–0.1)
Basophils Relative: 0 %
Eosinophils Absolute: 0 10*3/uL (ref 0.0–0.5)
Eosinophils Relative: 1 %
HCT: 37.7 % (ref 36.0–46.0)
Hemoglobin: 12.5 g/dL (ref 12.0–15.0)
Immature Granulocytes: 0 %
Lymphocytes Relative: 31 %
Lymphs Abs: 1.3 10*3/uL (ref 0.7–4.0)
MCH: 28 pg (ref 26.0–34.0)
MCHC: 33.2 g/dL (ref 30.0–36.0)
MCV: 84.3 fL (ref 80.0–100.0)
Monocytes Absolute: 0.3 10*3/uL (ref 0.1–1.0)
Monocytes Relative: 6 %
Neutro Abs: 2.6 10*3/uL (ref 1.7–7.7)
Neutrophils Relative %: 62 %
Platelet Count: 193 10*3/uL (ref 150–400)
RBC: 4.47 MIL/uL (ref 3.87–5.11)
RDW: 13.3 % (ref 11.5–15.5)
WBC Count: 4.3 10*3/uL (ref 4.0–10.5)
nRBC: 0 % (ref 0.0–0.2)

## 2019-04-26 LAB — CMP (CANCER CENTER ONLY)
ALT: 13 U/L (ref 0–44)
AST: 17 U/L (ref 15–41)
Albumin: 3.7 g/dL (ref 3.5–5.0)
Alkaline Phosphatase: 90 U/L (ref 38–126)
Anion gap: 10 (ref 5–15)
BUN: 25 mg/dL — ABNORMAL HIGH (ref 8–23)
CO2: 25 mmol/L (ref 22–32)
Calcium: 9.9 mg/dL (ref 8.9–10.3)
Chloride: 104 mmol/L (ref 98–111)
Creatinine: 1.31 mg/dL — ABNORMAL HIGH (ref 0.44–1.00)
GFR, Est AFR Am: 48 mL/min — ABNORMAL LOW (ref >60)
GFR, Estimated: 42 mL/min — ABNORMAL LOW (ref >60)
Glucose, Bld: 169 mg/dL — ABNORMAL HIGH (ref 70–99)
Potassium: 3.8 mmol/L (ref 3.5–5.1)
Sodium: 139 mmol/L (ref 135–145)
Total Bilirubin: 0.4 mg/dL (ref 0.3–1.2)
Total Protein: 7.2 g/dL (ref 6.5–8.1)

## 2019-04-26 LAB — TOTAL PROTEIN, URINE DIPSTICK: Protein, ur: NEGATIVE mg/dL

## 2019-04-26 LAB — TSH: TSH: 6.464 u[IU]/mL — ABNORMAL HIGH (ref 0.308–3.960)

## 2019-04-26 MED ORDER — LEVOTHYROXINE SODIUM 50 MCG PO TABS: 50.0000 ug | ORAL_TABLET | Freq: Every day | ORAL | 11 refills | Status: DC

## 2019-04-26 MED ORDER — SODIUM CHLORIDE 0.9% FLUSH
10.0000 mL | INTRAVENOUS | Status: DC | PRN
  Filled 2019-04-26: qty 10

## 2019-04-26 MED ORDER — SODIUM CHLORIDE 0.9% FLUSH
10.0000 mL | Freq: Once | INTRAVENOUS | Status: AC
  Administered 2019-04-26: 11:00:00 10 mL
  Filled 2019-04-26: qty 10

## 2019-04-26 MED ORDER — SODIUM CHLORIDE 0.9 % IV SOLN
Freq: Once | INTRAVENOUS | Status: AC
  Filled 2019-04-26: qty 250

## 2019-04-26 MED ORDER — HEPARIN SOD (PORK) LOCK FLUSH 100 UNIT/ML IV SOLN
500.0000 [IU] | Freq: Once | INTRAVENOUS | Status: DC | PRN
  Filled 2019-04-26: qty 5

## 2019-04-26 MED ORDER — SODIUM CHLORIDE 0.9 % IV SOLN
200.0000 mg | Freq: Once | INTRAVENOUS | Status: AC
  Administered 2019-04-26: 200 mg via INTRAVENOUS
  Filled 2019-04-26: qty 8

## 2019-04-26 NOTE — Assessment & Plan Note (Signed)
She has stable chronic kidney disease We will monitor carefully

## 2019-04-26 NOTE — Assessment & Plan Note (Addendum)
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  TSH today is still high and I plan to increase it to 50 mcg.

## 2019-04-26 NOTE — Patient Instructions (Signed)
[facility] Discharge Instructions for Patients Receiving Chemotherapy  Today you received the following Immunotherapy agent: Pembrolizumab (Keytruda)  To help prevent nausea and vomiting after your treatment, we encourage you to take your nausea medication as directed by your MD.   If you develop nausea and vomiting that is not controlled by your nausea medication, call the clinic.   BELOW ARE SYMPTOMS THAT SHOULD BE REPORTED IMMEDIATELY:  *FEVER GREATER THAN 100.5 F  *CHILLS WITH OR WITHOUT FEVER  NAUSEA AND VOMITING THAT IS NOT CONTROLLED WITH YOUR NAUSEA MEDICATION  *UNUSUAL SHORTNESS OF BREATH  *UNUSUAL BRUISING OR BLEEDING  TENDERNESS IN MOUTH AND THROAT WITH OR WITHOUT PRESENCE OF ULCERS  *URINARY PROBLEMS  *BOWEL PROBLEMS  UNUSUAL RASH Items with * indicate a potential emergency and should be followed up as soon as possible.  Feel free to call the clinic should you have any questions or concerns. The clinic phone number is [phone].  Please show the CHEMO ALERT CARD at check-in to the Emergency Department and triage nurse.  Coronavirus (COVID-19) Are you at risk?  Are you at risk for the Coronavirus (COVID-19)?  To be considered HIGH RISK for Coronavirus (COVID-19), you have to meet the following criteria:  . Traveled to [place], [place], [place], [place] or [place]; or in the [place] to [place], [place], [place], or [place]; and have fever, cough, and shortness of breath within the last 2 weeks of travel OR . Been in close contact with a person diagnosed with COVID-19 within the last 2 weeks and have fever, cough, and shortness of breath . IF YOU DO NOT MEET THESE CRITERIA, YOU ARE CONSIDERED LOW RISK FOR COVID-19.  What to do if you are HIGH RISK for COVID-19?  . If you are having a medical emergency, call 911. . Seek medical care right away. Before you go to a doctor's office, urgent care or emergency department, call ahead and  tell them about your recent travel, contact with someone diagnosed with COVID-19, and your symptoms. You should receive instructions from your physician's office regarding next steps of care.  . When you arrive at healthcare provider, tell the healthcare staff immediately you have returned from visiting [place], [place], [place], [place] or [place]; or traveled in the [place] to [place], [place], [place], or [place]; in the last two weeks or you have been in close contact with a person diagnosed with COVID-19 in the last 2 weeks.   . Tell the health care staff about your symptoms: fever, cough and shortness of breath. . After you have been seen by a medical provider, you will be either: o Tested for (COVID-19) and discharged home on quarantine except to seek medical care if symptoms worsen, and asked to  - Stay home and avoid contact with others until you get your results (4-5 days)  - Avoid travel on public transportation if possible (such as bus, train, or airplane) or o Sent to the Emergency Department by EMS for evaluation, COVID-19 testing, and possible admission depending on your condition and test results.  What to do if you are LOW RISK for COVID-19?  Reduce your risk of any infection by using the same precautions used for avoiding the common cold or flu:  . Wash your hands often with soap and warm water for at least 20 seconds.  If soap and water are not readily available, use an alcohol-based hand sanitizer with at least 60% alcohol.  .   If coughing or sneezing, cover your mouth and nose by coughing or sneezing into the elbow areas of your shirt or coat, into a tissue or into your sleeve (not your hands). . Avoid shaking hands with others and consider head nods or verbal greetings only. . Avoid touching your eyes, nose, or mouth with unwashed hands.  . Avoid close contact with people who are sick. . Avoid places or events with large numbers of people in one location, like  concerts or sporting events. . Carefully consider travel plans you have or are making. . If you are planning any travel outside or inside the [place], visit the CDC's Travelers' Health webpage for the latest health notices. . If you have some symptoms but not all symptoms, continue to monitor at home and seek medical attention if your symptoms worsen. . If you are having a medical emergency, call 911.   ADDITIONAL HEALTHCARE OPTIONS FOR PATIENTS  [facility] / e-Visit: [email or web address]         [facility]: [phone]  [facility]: [phone]                   [facility]: [phone]  

## 2019-04-26 NOTE — Assessment & Plan Note (Signed)
So far, she tolerated treatment very well without major side effects I have reviewed multiple CT imaging with the patient which show positive response to therapy Continue similar treatment indefinitely I plan to space out her next future imaging to 4 to 6 months

## 2019-04-26 NOTE — Telephone Encounter (Signed)
Scheduled appt per 1/8 sch message - pt to get an updated schedule after chemo in tx area.

## 2019-04-26 NOTE — Assessment & Plan Note (Signed)
The lymphadenopathy seen on her chest has regressed in size She has no disease detected on CT abdomen In the future, I plan to only order CT imaging of the chest to spare her from taking oral contrast

## 2019-04-26 NOTE — Assessment & Plan Note (Signed)
She is aware that Lenvima can cause exacerbation of hypertension during treatment I recommend her to check her blood pressure twice a day We will monitor carefully for proteinuria, so far negative today

## 2019-04-26 NOTE — Progress Notes (Signed)
[facility] PROGRESS NOTE  Patient Care Team: Jonathon Jordan, MD as PCP - General (Family Medicine)  ASSESSMENT & PLAN:  Endometrial cancer [facility]) So far, she tolerated treatment very well without major side effects I have reviewed multiple CT imaging with the patient which show positive response to therapy Continue similar treatment indefinitely I plan to space out her next future imaging to 4 to 6 months  Metastasis to lymph nodes (HCC) The lymphadenopathy seen on her chest has regressed in size She has no disease detected on CT abdomen In the future, I plan to only order CT imaging of the chest to spare her from taking oral contrast  Chronic kidney disease (CKD), stage III (moderate) ([place]) She has stable chronic kidney disease We will monitor carefully  Essential hypertension She is aware that Lenvima can cause exacerbation of hypertension during treatment I recommend her to check her blood pressure twice a day We will monitor carefully for proteinuria, so far negative today  Acquired hypothyroidism She has acquired hypothyroidism secondary to side effects of Keytruda She was started on low-dose Synthroid recently.  TSH today is still high and I plan to increase it to 50 mcg.   No orders of the defined types were placed in this encounter.   All questions were answered. The patient knows to call the clinic with any problems, questions or concerns. The total time spent in the appointment was 30 minutes encounter with patients including review of chart and various tests results, discussions about plan of care and coordination of care plan   [name], MD 04/26/2019 1:31 PM  INTERVAL HISTORY: Please see below for problem oriented charting. 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 Leo Grosser who obtained an endometrial biopsy showing poorly differentiated endometrial carcinoma and negative endocervical curettage   12/12/2017 Imaging   MAMMOGRAM FINDINGS: In the right axilla, a possible mass warrants further evaluation. In the left breast, no findings suspicious for malignancy.  Images were processed with CAD.  IMPRESSION: Further evaluation is suggested for possible mass in the right axilla.    12/24/2017 Imaging   Ct scan chest, abdomen and pelvis 1. Marked thickening of the endometrium (42 mm) compatible with known primary endometrial malignancy. No evidence of extrauterine invasion. 2. No pelvic or retroperitoneal adenopathy. 3. Right middle lobe 4 mm solid pulmonary nodule, for which follow-up chest CT is advised in 3-6 months. 4. Several findings that are equivocal for distant metastatic disease. Vaguely nodular heterogeneous hyperenhancement in the peripheral right liver lobe, which could represent benign transient perfusional phenomena, with underlying liver lesions not entirely excluded. Mildly sclerotic T12 vertebral lesion. Mildly enlarged right axillary lymph node. The best single test to further evaluate these findings would be a PET-CT. Alternative tests include bone scan or thoracic MRI without and with IV contrast for the T12 osseous lesion, MRI abdomen without and with IV contrast for  the liver findings, and diagnostic mammographic evaluation for the right axillary node. 5.  Aortic  Atherosclerosis (ICD10-I70.0).    01/09/2018 PET scan   1. Moderate hypermetabolism corresponding to enlarging axillary node since 12/22/2017. Highly suspicious for an atypical distribution of metastatic disease. 2. No hypermetabolism to suggest hepatic or T12 osseous metastasis. 3. Hypermetabolic endometrial primary.   01/23/2018 Initial Diagnosis   Endometrial cancer ([place])   01/23/2018 Pathology Results   1. Lymph node, sentinel, biopsy, left external iliac - NO CARCINOMA IDENTIFIED IN ONE LYMPH NODE (0/1) - SEE COMMENT 2. Lymph nodes, regional resection, right para aortic - NO CARCINOMA IDENTIFIED IN FOUR LYMPH NODES (0/4) - SEE COMMENT 3. Lymph nodes, regional resection, right pelvic - NO CARCINOMA IDENTIFIED IN EIGHT LYMPH NODES (0/8) - SEE COMMENT 4. Cul-de-sac biopsy - METASTATIC CARCINOMA 5. Uterus +/- tubes/ovaries, neoplastic, cervix, bilateral fallopian tubes and ovaries UTERUS: - MIXED SEROUS AND ENDOMETRIOID CARCINOMA - SEROSAL IMPLANTS PRESENT - LYMPHOVASCULAR SPACE INVASION PRESENT - LEIOMYOMATA (1.5 CM; LARGEST) - SEE ONCOLOGY TABLE AND COMMENT BELOW CERVIX: - BENIGN NABOTHIAN CYSTS - NO CARCINOMA IDENTIFIED BILATERAL OVARIES: - METASTATIC CARCINOMA PRESENT ON OVARIAN SURFACE BILATERAL FALLOPIAN TUBES: - INTRALUMINAL CARCINOMA Microscopic Comment 1. -3. Cytokeratin AE1/3 was performed on the sentinel lymph nodes to exclude micrometastasis. There is no evidence of metastatic carcinoma by immunohistochemistry. 5. 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%) Histologic Grade: N/A Myometrial invasion: Estimated less than 50% myometrial invasion (0.3 cm of myometrium involved; 1.4 cm measured thickness) Uterine Serosa Involvement:  Present Cervical stromal involvement: Not identified Extent of involvement of other organs: - Fallopian tube (left within the lumen) - Ovary, left (surface involvement) - Cul-de-sac Lymphovascular invasion: Present Regional Lymph Nodes: Examined: 1 Sentinel 12 Non-sentinel 13 Total Lymph nodes with metastasis: 0 Isolated tumor cells (< 0.2 mm): 0 Micrometastasis: (> 0.2 mm and < 2.0 mm): 0 Macrometastasis: (> 2.0 mm): 0 Extracapsular extension: N/A Tumor block for ancillary studies: 5E, 5B MMR / MSI testing: Pending will be reported separately Pathologic Stage Classification (pTNM, AJCC 8th edition): pT3a, pN0 FIGO Stage: IIIA COMMENT: There is tumor present on the surface of the left ovary and within the lumen of the left fallopian tube. The carcinoma appears to be mixed with the largest component being high grade serous carcinoma. Dr. Lyndon Code reviewed the case and agrees with the above diagnosis.   01/23/2018 Surgery   Surgeon: Donaciano Eva   Operation: Robotic-assisted laparoscopic total hysterectomy with bilateral salpingoophorectomy, SLN injection, mapping and biopsy, right pelvic and para-aortic lymphadenectomy  Operative Findings:  : 10-12cm bulky uterus with frank serosal involvement on posterior cul de sac peritoneum and anterior peritoneum which adhesed the bladder to the anterior uterus. Unilateral mapping on left pelvis. No grossly suspicious nodes. Normal omentum and diaphragms.    02/01/2018 Pathology Results   Lymph node, needle/core biopsy, right axilla - METASTATIC CARCINOMA - SEE COMMENT Microscopic Comment The neoplastic cells are positive for cytokeratin 7 and Pax-8 but negative for cytokeratin 5/6, cytokeratin 20, Gata-3, p63, p53 and GCDFP. Overall, the immunoprofile is consistent with metastasis from the patient's known gynecologic carcinoma.   02/01/2018 Procedure   Ultrasound-guided core biopsies of a suspicious right axillary lymph node.    02/21/2018 - 06/13/2018 Chemotherapy   The patient had carboplatin & Taxol x 6   03/26/2018 - 04/30/2018 Radiation Therapy  Radiation treatment dates:   03/26/18, 04/02/18, 04/19/18, 04/23/18 04/30/18  Site/dose: proximal Vagina, 6 Gy in 5 fractions for a total dose of 30 Gy    04/26/2018 PET scan   1. Interval resection of the hypermetabolic endometrial primary. No discernible hypermetabolic pelvic sidewall or peritoneal metastases. 2. Stable hypermetabolic bilateral axillary lymphadenopathy. The degree of hypermetabolism in these lymph nodes remains highly suspicious for neoplasm. 3. Interval development of low level FDG uptake in 2 stable, small lymph nodes in the left groin region. This may be reactive, but close attention recommended to exclude metastatic involvement. 4. Stable non hypermetabolic low-density liver lesions and the mixed lucent and sclerotic T12 lesion is stable without hypermetabolism today.   07/09/2018 Imaging   Status post hysterectomy.  Mild axillary lymphadenopathy, improved. Nodal metastases not excluded.  Irregular wall thickening involving the anterior bladder, possibly reflecting radiation cystitis versus tumor.  Additional stable findings as above, including a 5 mm right middle lobe nodule and stable sclerosis involving the T12 vertebral body.   10/10/2018 Imaging   1. Stable exam. No findings within the abdomen or pelvis to suggest metastatic disease. 2. Unchanged appearance of sclerosis involving the T12 vertebra. 3.  Aortic Atherosclerosis (ICD10-I70.0).   10/10/2018 Imaging   Ct abdomen and pelvis 1. Stable exam. No findings within the abdomen or pelvis to suggest metastatic disease. 2. Unchanged appearance of sclerosis involving the T12 vertebra. 3.  Aortic Atherosclerosis (ICD10-I70.0).   01/16/2019 PET scan   1. Enlargement and increased activity in the left axillary, right axillary, and left inguinal adenopathy compatible with progressive  malignancy. 2. Stable 4 mm right middle lobe subpleural nodule, not appreciably hypermetabolic. 3. Other imaging findings of potential clinical significance: Right kidney lower pole cyst. Aortic Atherosclerosis (ICD10-I70.0). Prominent stool throughout the colon favors constipation. Mild chronic left maxillary sinusitis.     02/01/2019 -  Chemotherapy   The patient had pembrolizumab (KEYTRUDA) 200 mg in sodium chloride 0.9 % 50 mL chemo infusion, 200 mg, Intravenous, Once, 5 of 7 cycles Administration: 200 mg (02/01/2019), 200 mg (03/15/2019), 200 mg (04/05/2019), 200 mg (02/22/2019), 200 mg (04/26/2019)  for chemotherapy treatment.    04/25/2019 Imaging   Ct imaging 1. Interval response to therapy. Decrease in size of bilateral axillary and left inguinal lymph nodes. No new or progressive findings identified. 2. Stable sclerotic appearance of the T12 vertebra. 3. Aortic atherosclerosis. Lad coronary artery calcification noted.     REVIEW OF SYSTEMS:   Constitutional: Denies fevers, chills or abnormal weight loss Eyes: Denies blurriness of vision Ears, nose, mouth, throat, and face: Denies mucositis or sore throat Respiratory: Denies cough, dyspnea or wheezes Cardiovascular: Denies palpitation, chest discomfort or lower extremity swelling Gastrointestinal:  Denies nausea, heartburn or change in bowel habits Skin: Denies abnormal skin rashes Lymphatics: Denies new lymphadenopathy or easy bruising Neurological:Denies numbness, tingling or new weaknesses Behavioral/Psych: Mood is stable, no new changes  All other systems were reviewed with the patient and are negative.  I have reviewed the past medical history, past surgical history, social history and family history with the patient and they are unchanged from previous note.  ALLERGIES:  is allergic to sulfa antibiotics and sulfamethoxazole.  MEDICATIONS:  Current Outpatient Medications  Medication Sig Dispense Refill  . Insulin  Isophane & Regular Human (NOVOLIN 70/30 FLEXPEN) (70-30) 100 UNIT/ML PEN Inject 30 Units into the skin daily with breakfast. 10 pen 0  . ketoconazole (NIZORAL) 2 % cream Apply to web space twice a day for 1 week  then daily for 1 month (Patient taking differently: Apply 1 application topically daily as needed for irritation. Apply to web space twice a day for 1 week then daily for 1 month) 15 g 0  . lenvatinib 10 mg daily dose (LENVIMA, 10 MG DAILY DOSE,) capsule Take 1 capsule (10 mg total) by mouth daily. 30 capsule 11  . levothyroxine (SYNTHROID) 50 MCG tablet Take 1 tablet (50 mcg total) by mouth daily before breakfast. 30 tablet 11  . lidocaine-prilocaine (EMLA) cream Apply to affected area once 30 g 3  . lisinopril-hydrochlorothiazide (PRINZIDE,ZESTORETIC) 20-12.5 MG tablet Take 1 tablet by mouth daily. (Patient taking differently: Take 1 tablet by mouth every morning. ) 30 tablet 11  . ondansetron (ZOFRAN) 8 MG tablet Take 1 tablet (8 mg total) by mouth daily. 30 tablet 1  . OneTouch Delica Lancets 33O MISC 1 each by Other route 2 (two) times daily. Use to monitor glucose levels BID; E11.65 100 each 2  . ONETOUCH VERIO test strip 1 each by Other route 3 (three) times daily. And lancets 3/day 300 each 3  . RELION PEN NEEDLES 32G X 4 MM MISC USE AS DIRECTED 50 each 0  . simvastatin (ZOCOR) 10 MG tablet Take 10 mg by mouth at bedtime.     . Vitamin D, Ergocalciferol, 2000 units CAPS Take 2,000 Units by mouth daily.   0   No current facility-administered medications for this visit.   Facility-Administered Medications Ordered in Other Visits  Medication Dose Route Frequency Provider Last Rate Last Admin  . heparin lock flush 100 unit/mL  500 Units Intracatheter Once PRN Alvy Bimler, Elanah Osmanovic, MD      . sodium chloride flush (NS) 0.9 % injection 10 mL  10 mL Intracatheter PRN Alvy Bimler, Abreanna Drawdy, MD        PHYSICAL EXAMINATION: ECOG PERFORMANCE STATUS: 1 - Symptomatic but completely ambulatory  Vitals:    04/26/19 1104  BP: 129/76  Pulse: (!) 113  Resp: 18  Temp: 97.8 F (36.6 C)  SpO2: 100%   Filed Weights   04/26/19 1104  Weight: 203 lb (92.1 kg)    GENERAL:alert, no distress and comfortable SKIN: skin color, texture, turgor are normal, no rashes or significant lesions EYES: normal, Conjunctiva are pink and non-injected, sclera clear OROPHARYNX:no exudate, no erythema and lips, buccal mucosa, and tongue normal  NECK: supple, thyroid normal size, non-tender, without nodularity LYMPH:  no palpable lymphadenopathy in the cervical, axillary or inguinal LUNGS: clear to auscultation and percussion with normal breathing effort HEART: regular rate & rhythm and no murmurs and no lower extremity edema ABDOMEN:abdomen soft, non-tender and normal bowel sounds Musculoskeletal:no cyanosis of digits and no clubbing  NEURO: alert & oriented x 3 with fluent speech, no focal motor/sensory deficits  LABORATORY DATA:  I have reviewed the data as listed    Component Value Date/Time   NA 139 04/26/2019 1050   K 3.8 04/26/2019 1050   CL 104 04/26/2019 1050   CO2 25 04/26/2019 1050   GLUCOSE 169 (H) 04/26/2019 1050   BUN 25 (H) 04/26/2019 1050   CREATININE 1.31 (H) 04/26/2019 1050   CALCIUM 9.9 04/26/2019 1050   PROT 7.2 04/26/2019 1050   ALBUMIN 3.7 04/26/2019 1050   AST 17 04/26/2019 1050   ALT 13 04/26/2019 1050   ALKPHOS 90 04/26/2019 1050   BILITOT 0.4 04/26/2019 1050   GFRNONAA 42 (L) 04/26/2019 1050   GFRAA 48 (L) 04/26/2019 1050    No results found for: SPEP, UPEP  Lab Results  Component Value Date   WBC 4.3 04/26/2019   NEUTROABS 2.6 04/26/2019   HGB 12.5 04/26/2019   HCT 37.7 04/26/2019   MCV 84.3 04/26/2019   PLT 193 04/26/2019      Chemistry      Component Value Date/Time   NA 139 04/26/2019 1050   K 3.8 04/26/2019 1050   CL 104 04/26/2019 1050   CO2 25 04/26/2019 1050   BUN 25 (H) 04/26/2019 1050   CREATININE 1.31 (H) 04/26/2019 1050      Component Value  Date/Time   CALCIUM 9.9 04/26/2019 1050   ALKPHOS 90 04/26/2019 1050   AST 17 04/26/2019 1050   ALT 13 04/26/2019 1050   BILITOT 0.4 04/26/2019 1050       RADIOGRAPHIC STUDIES: I have reviewed multiple imaging studies with the patient I have personally reviewed the radiological images as listed and agreed with the findings in the report. CT Chest W Contrast  Result Date: 04/25/2019 CLINICAL DATA:  Restaging endometrial cancer. EXAM: CT CHEST, ABDOMEN, AND PELVIS WITH CONTRAST TECHNIQUE: Multidetector CT imaging of the chest, abdomen and pelvis was performed following the standard protocol during bolus administration of intravenous contrast. CONTRAST:  15m OMNIPAQUE IOHEXOL 300 MG/ML  SOLN COMPARISON:  PET-CT 01/16/2019 FINDINGS: CT CHEST FINDINGS Cardiovascular: Normal heart size. Mild aortic atherosclerosis. Lad coronary artery calcification. Mediastinum/Nodes: Enlarged axillary lymph node measures 1.7 cm, image 11/2. Previously 3.0 cm. Index left axillary node measures 0.8 cm, image 18/2. Previously 1.8 cm. No supraclavicular adenopathy. No mediastinal or hilar adenopathy. Thyroid gland, trachea, and esophagus demonstrate no significant findings. Lungs/Pleura: No pleural effusion, airspace consolidation or atelectasis. Stable 4 mm perifissural right middle lobe lung nodule, image 56/6. Within the left upper lobe there is a ground-glass attenuating nodule measuring 5 mm. Unchanged from CT chest 12/22/2017. Musculoskeletal: Stable sclerotic appearance of the T12 vertebra compared with 12/22/2017. CT ABDOMEN PELVIS FINDINGS Hepatobiliary: Unchanged enhancing lesion within segments 7 measuring 0.9 cm, image 48/2. No suspicious liver lesions identified. The gallbladder is normal. No biliary dilatation. Pancreas: Unremarkable. No pancreatic ductal dilatation or surrounding inflammatory changes. Spleen: Enhancing lesion within the upper pole of spleen is unchanged from 12/22/2017 measuring 1.1 cm. Favor  benign abnormality. Adrenals/Urinary Tract: Normal appearance of the adrenal glands. Simple appearing cyst arises from inferior pole of right kidney measuring 3.9 cm, image 18/7. Normal left kidney. Urinary bladder is unremarkable. Stomach/Bowel: Stomach normal. No abnormal bowel wall thickening or inflammation identified. No bowel distention. Vascular/Lymphatic: Aortic atherosclerosis. No abdominal adenopathy. Index left inguinal node measures 1 cm, image 108/2. Previously 1.8 cm. Reproductive: Status post hysterectomy. No adnexal mass identified. Other: No ascites or focal fluid collections identified. Musculoskeletal: No peritoneal nodule or omental cake. IMPRESSION: 1. Interval response to therapy. Decrease in size of bilateral axillary and left inguinal lymph nodes. No new or progressive findings identified. 2. Stable sclerotic appearance of the T12 vertebra. 3. Aortic atherosclerosis. Lad coronary artery calcification noted. Aortic Atherosclerosis (ICD10-I70.0). Electronically Signed   By: TKerby MoorsM.D.   On: 04/25/2019 10:10   CT Abdomen Pelvis W Contrast  Result Date: 04/25/2019 CLINICAL DATA:  Restaging endometrial cancer. EXAM: CT CHEST, ABDOMEN, AND PELVIS WITH CONTRAST TECHNIQUE: Multidetector CT imaging of the chest, abdomen and pelvis was performed following the standard protocol during bolus administration of intravenous contrast. CONTRAST:  853mOMNIPAQUE IOHEXOL 300 MG/ML  SOLN COMPARISON:  PET-CT 01/16/2019 FINDINGS: CT CHEST FINDINGS Cardiovascular: Normal heart size. Mild aortic atherosclerosis. Lad coronary artery calcification. Mediastinum/Nodes: Enlarged  axillary lymph node measures 1.7 cm, image 11/2. Previously 3.0 cm. Index left axillary node measures 0.8 cm, image 18/2. Previously 1.8 cm. No supraclavicular adenopathy. No mediastinal or hilar adenopathy. Thyroid gland, trachea, and esophagus demonstrate no significant findings. Lungs/Pleura: No pleural effusion, airspace  consolidation or atelectasis. Stable 4 mm perifissural right middle lobe lung nodule, image 56/6. Within the left upper lobe there is a ground-glass attenuating nodule measuring 5 mm. Unchanged from CT chest 12/22/2017. Musculoskeletal: Stable sclerotic appearance of the T12 vertebra compared with 12/22/2017. CT ABDOMEN PELVIS FINDINGS Hepatobiliary: Unchanged enhancing lesion within segments 7 measuring 0.9 cm, image 48/2. No suspicious liver lesions identified. The gallbladder is normal. No biliary dilatation. Pancreas: Unremarkable. No pancreatic ductal dilatation or surrounding inflammatory changes. Spleen: Enhancing lesion within the upper pole of spleen is unchanged from 12/22/2017 measuring 1.1 cm. Favor benign abnormality. Adrenals/Urinary Tract: Normal appearance of the adrenal glands. Simple appearing cyst arises from inferior pole of right kidney measuring 3.9 cm, image 18/7. Normal left kidney. Urinary bladder is unremarkable. Stomach/Bowel: Stomach normal. No abnormal bowel wall thickening or inflammation identified. No bowel distention. Vascular/Lymphatic: Aortic atherosclerosis. No abdominal adenopathy. Index left inguinal node measures 1 cm, image 108/2. Previously 1.8 cm. Reproductive: Status post hysterectomy. No adnexal mass identified. Other: No ascites or focal fluid collections identified. Musculoskeletal: No peritoneal nodule or omental cake. IMPRESSION: 1. Interval response to therapy. Decrease in size of bilateral axillary and left inguinal lymph nodes. No new or progressive findings identified. 2. Stable sclerotic appearance of the T12 vertebra. 3. Aortic atherosclerosis. Lad coronary artery calcification noted. Aortic Atherosclerosis (ICD10-I70.0). Electronically Signed   By: Kerby Moors M.D.   On: 04/25/2019 10:10

## 2019-04-27 LAB — T4: T4, Total: 7.5 ug/dL (ref 4.5–12.0)

## 2019-05-01 ENCOUNTER — Telehealth: Payer: Self-pay

## 2019-05-01 NOTE — Telephone Encounter (Signed)
She called and left a message to call her. Her sister has COVID and was admitted yesterday to the hospital. Krista Johnson is wanting to be tested for COVID.  Called back. She has already scheduled a COVID test for tomorrow afternoon at [facility]. Instructed to call the office if needed. She verbalized understanding.

## 2019-05-02 ENCOUNTER — Ambulatory Visit: Payer: Medicare Other | Attending: Internal Medicine

## 2019-05-02 DIAGNOSIS — Z20822 Contact with and (suspected) exposure to covid-19: Secondary | ICD-10-CM | POA: Diagnosis not present

## 2019-05-03 LAB — NOVEL CORONAVIRUS, NAA: SARS-CoV-2, NAA: NOT DETECTED

## 2019-05-06 ENCOUNTER — Telehealth: Payer: Self-pay

## 2019-05-06 ENCOUNTER — Telehealth: Payer: Self-pay | Admitting: *Deleted

## 2019-05-06 NOTE — Telephone Encounter (Signed)
Patient called about getting the COVID vaccine. Her test from last week was negative. Can she get the shot now?

## 2019-05-06 NOTE — Telephone Encounter (Signed)
I can address this when I see her next week It is not clear if she can benefit from the vaccine while on treatment

## 2019-05-06 NOTE — Telephone Encounter (Signed)
Left message for patient to return call.

## 2019-05-06 NOTE — Telephone Encounter (Signed)
Patient returned call from RN.  LPN gave message from Dr. Alvy Bimler about discussing Covid vaccine at her next appt.  Patient Is now asking if it's ok to take Vitamin C and Zinc as a preventative because that's what the doctor told her mother to do and she tested positive.  Patient is not allowed to leave her house until Monday 1/25.

## 2019-05-07 ENCOUNTER — Telehealth: Payer: Self-pay

## 2019-05-07 NOTE — Telephone Encounter (Signed)
Yes, she can take those supplement

## 2019-05-07 NOTE — Telephone Encounter (Signed)
Called her and told per Dr. Alvy Bimler it is okay to take vitamin C and Zinc. She verbalized understanding.

## 2019-05-08 NOTE — Telephone Encounter (Signed)
Telephone call to patient she will continue to take the supplements. She has not been checking her blood pressure. She knows Dr. Alvy Bimler asked her to do this and keep a log. She has not been doing this. She states she just forgets. Reminded patient to take her BP daily at least and keep a log. Patient agrees stressing she will try. Asked patient to take BP while nurse was on the phone and patient responded that she is not sure where her cuff is. Patient states she will locate the cuff and place it in a spot to remind her.

## 2019-05-10 ENCOUNTER — Other Ambulatory Visit: Payer: Self-pay | Admitting: *Deleted

## 2019-05-10 DIAGNOSIS — C541 Malignant neoplasm of endometrium: Secondary | ICD-10-CM

## 2019-05-10 NOTE — Progress Notes (Signed)
Patient called concerned that she does not have anyone else to care for her mother while she is getting treatment. She is fearful she will not be able to come Friday. Patient reports she does not have any family resources available. Referral placed for Social Work to contact patient.   Patient's mother has a doctors appt on Wednesday next week. She is going to discuss the possibility of home health nurses with her doctor to provide some care she has not been able to keep up with recently.

## 2019-05-14 ENCOUNTER — Encounter: Payer: Self-pay | Admitting: *Deleted

## 2019-05-14 ENCOUNTER — Telehealth: Payer: Self-pay

## 2019-05-14 NOTE — Progress Notes (Signed)
[place] Work  Clinical Social Work was referred by medical oncology for caregiver needs.  Clinical Social Worker contacted patient by phone  to offer support and assess for needs.  [name] reported she is receiving treatment and is the primary caregiver for her elderly mother.  She has no one to watch her mother this Friday while she receives treatment at cancer center.  She shared her mother's condition has slowly deteriorated and she has difficulty caring for her.  Ms. Candell has contacted many different agencies and has been unable to find assistance- CSW is unaware of other resources.  CSW did recommend Medicaid personal care services for patient's mother as a plan for the future.  CSW sent personal care services form to patient's mother's PCP at patient request.  Patient plans to follow up with CSW as needed.      Gwinda Maine, LCSW  Clinical Social Worker [facility]

## 2019-05-14 NOTE — Telephone Encounter (Signed)
She called and left a message to call her. Called back. She is concerned that she does not have anyone else to care for her mother while she is getting treatment on 1/29. She is afraid she will miss Friday's appt. Patient reports she does not have anyone available to help her. Sent message to social worker to call her back.

## 2019-05-16 ENCOUNTER — Telehealth: Payer: Self-pay

## 2019-05-16 NOTE — Telephone Encounter (Signed)
She called and left a message. She will be at her appts tomorrow. Her Mom was admitted to the hospital. Just FYI.

## 2019-05-17 ENCOUNTER — Encounter: Payer: Self-pay | Admitting: Hematology and Oncology

## 2019-05-17 ENCOUNTER — Inpatient Hospital Stay: Payer: Medicare Other

## 2019-05-17 ENCOUNTER — Inpatient Hospital Stay (HOSPITAL_BASED_OUTPATIENT_CLINIC_OR_DEPARTMENT_OTHER): Payer: Medicare Other | Admitting: Hematology and Oncology

## 2019-05-17 ENCOUNTER — Other Ambulatory Visit: Payer: Self-pay

## 2019-05-17 VITALS — HR 105

## 2019-05-17 DIAGNOSIS — C541 Malignant neoplasm of endometrium: Secondary | ICD-10-CM | POA: Diagnosis not present

## 2019-05-17 DIAGNOSIS — D61818 Other pancytopenia: Secondary | ICD-10-CM

## 2019-05-17 DIAGNOSIS — C773 Secondary and unspecified malignant neoplasm of axilla and upper limb lymph nodes: Secondary | ICD-10-CM

## 2019-05-17 DIAGNOSIS — I10 Essential (primary) hypertension: Secondary | ICD-10-CM

## 2019-05-17 DIAGNOSIS — C779 Secondary and unspecified malignant neoplasm of lymph node, unspecified: Secondary | ICD-10-CM | POA: Diagnosis not present

## 2019-05-17 DIAGNOSIS — Z7189 Other specified counseling: Secondary | ICD-10-CM

## 2019-05-17 DIAGNOSIS — R809 Proteinuria, unspecified: Secondary | ICD-10-CM | POA: Diagnosis not present

## 2019-05-17 DIAGNOSIS — Z5112 Encounter for antineoplastic immunotherapy: Secondary | ICD-10-CM | POA: Diagnosis not present

## 2019-05-17 DIAGNOSIS — E039 Hypothyroidism, unspecified: Secondary | ICD-10-CM | POA: Diagnosis not present

## 2019-05-17 DIAGNOSIS — Z9221 Personal history of antineoplastic chemotherapy: Secondary | ICD-10-CM | POA: Diagnosis not present

## 2019-05-17 DIAGNOSIS — Z923 Personal history of irradiation: Secondary | ICD-10-CM | POA: Diagnosis not present

## 2019-05-17 LAB — COMPREHENSIVE METABOLIC PANEL
ALT: 13 U/L (ref 0–44)
AST: 18 U/L (ref 15–41)
Albumin: 3.5 g/dL (ref 3.5–5.0)
Alkaline Phosphatase: 77 U/L (ref 38–126)
Anion gap: 12 (ref 5–15)
BUN: 34 mg/dL — ABNORMAL HIGH (ref 8–23)
CO2: 23 mmol/L (ref 22–32)
Calcium: 9.5 mg/dL (ref 8.9–10.3)
Chloride: 103 mmol/L (ref 98–111)
Creatinine, Ser: 1.47 mg/dL — ABNORMAL HIGH (ref 0.44–1.00)
GFR calc Af Amer: 42 mL/min — ABNORMAL LOW (ref >60)
GFR calc non Af Amer: 36 mL/min — ABNORMAL LOW (ref >60)
Glucose, Bld: 174 mg/dL — ABNORMAL HIGH (ref 70–99)
Potassium: 3.7 mmol/L (ref 3.5–5.1)
Sodium: 138 mmol/L (ref 135–145)
Total Bilirubin: 0.4 mg/dL (ref 0.3–1.2)
Total Protein: 7 g/dL (ref 6.5–8.1)

## 2019-05-17 LAB — CBC WITH DIFFERENTIAL/PLATELET
Abs Immature Granulocytes: 0.01 10*3/uL (ref 0.00–0.07)
Basophils Absolute: 0 10*3/uL (ref 0.0–0.1)
Basophils Relative: 0 %
Eosinophils Absolute: 0 10*3/uL (ref 0.0–0.5)
Eosinophils Relative: 1 %
HCT: 36.7 % (ref 36.0–46.0)
Hemoglobin: 11.8 g/dL — ABNORMAL LOW (ref 12.0–15.0)
Immature Granulocytes: 0 %
Lymphocytes Relative: 20 %
Lymphs Abs: 0.9 10*3/uL (ref 0.7–4.0)
MCH: 28 pg (ref 26.0–34.0)
MCHC: 32.2 g/dL (ref 30.0–36.0)
MCV: 87 fL (ref 80.0–100.0)
Monocytes Absolute: 0.2 10*3/uL (ref 0.1–1.0)
Monocytes Relative: 5 %
Neutro Abs: 3.2 10*3/uL (ref 1.7–7.7)
Neutrophils Relative %: 74 %
Platelets: 140 10*3/uL — ABNORMAL LOW (ref 150–400)
RBC: 4.22 MIL/uL (ref 3.87–5.11)
RDW: 13.4 % (ref 11.5–15.5)
WBC: 4.3 10*3/uL (ref 4.0–10.5)
nRBC: 0 % (ref 0.0–0.2)

## 2019-05-17 LAB — TOTAL PROTEIN, URINE DIPSTICK: Protein, ur: 30 mg/dL — AB

## 2019-05-17 LAB — TSH: TSH: 4.028 u[IU]/mL — ABNORMAL HIGH (ref 0.308–3.960)

## 2019-05-17 MED ORDER — SODIUM CHLORIDE 0.9% FLUSH
10.0000 mL | Freq: Once | INTRAVENOUS | Status: AC
  Administered 2019-05-17: 09:00:00 10 mL
  Filled 2019-05-17: qty 10

## 2019-05-17 MED ORDER — SODIUM CHLORIDE 0.9 % IV SOLN
200.0000 mg | Freq: Once | INTRAVENOUS | Status: AC
  Administered 2019-05-17: 200 mg via INTRAVENOUS
  Filled 2019-05-17: qty 8

## 2019-05-17 MED ORDER — SODIUM CHLORIDE 0.9 % IV SOLN
Freq: Once | INTRAVENOUS | Status: AC
  Filled 2019-05-17: qty 250

## 2019-05-17 MED ORDER — HEPARIN SOD (PORK) LOCK FLUSH 100 UNIT/ML IV SOLN
500.0000 [IU] | Freq: Once | INTRAVENOUS | Status: AC | PRN
  Administered 2019-05-17: 500 [IU]
  Filled 2019-05-17: qty 5

## 2019-05-17 MED ORDER — SODIUM CHLORIDE 0.9% FLUSH
10.0000 mL | INTRAVENOUS | Status: DC | PRN
  Administered 2019-05-17: 10 mL
  Filled 2019-05-17: qty 10

## 2019-05-17 NOTE — Assessment & Plan Note (Signed)
So far, she tolerated treatment very well without major side effects Continue similar treatment indefinitely I plan to space out her next future imaging to 4 to 6 months, due maybe around May 2021 the earliest

## 2019-05-17 NOTE — Assessment & Plan Note (Signed)
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  I will adjust that as needed

## 2019-05-17 NOTE — Progress Notes (Signed)
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   All questions were answered. The patient knows to call the clinic with any problems, questions or concerns. The total time spent in the appointment was 20 minutes encounter with patients including review of chart and various tests results, discussions about plan of care and coordination of care plan   [name], MD 05/17/2019 10:00 AM  INTERVAL HISTORY: Please see below for problem oriented charting. 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 Leo Grosser who obtained an endometrial biopsy showing poorly differentiated endometrial carcinoma and negative endocervical curettage   12/12/2017 Imaging   MAMMOGRAM FINDINGS: In the right axilla, a possible mass warrants further evaluation. In the left breast, no findings suspicious for malignancy.  Images were processed with CAD.  IMPRESSION: Further evaluation is suggested for possible mass in the right axilla.    12/24/2017 Imaging   Ct scan chest, abdomen and pelvis 1. Marked thickening of the endometrium (42 mm) compatible with known primary endometrial malignancy. No evidence of extrauterine invasion. 2. No pelvic or retroperitoneal adenopathy. 3. Right middle lobe 4 mm solid pulmonary nodule, for which follow-up chest CT is advised in 3-6 months. 4. Several findings that are equivocal for distant metastatic disease. Vaguely nodular heterogeneous hyperenhancement in the peripheral right liver lobe, which could represent benign transient perfusional phenomena, with underlying liver lesions not entirely excluded. Mildly sclerotic T12 vertebral lesion. Mildly enlarged right axillary lymph node. The best single test to further evaluate these findings would be a PET-CT. Alternative tests include bone scan or thoracic MRI without and with IV contrast for the T12 osseous lesion, MRI abdomen without and with IV contrast for the liver findings, and diagnostic mammographic evaluation for the right axillary node. 5.  Aortic Atherosclerosis (ICD10-I70.0).    01/09/2018 PET scan   1. Moderate hypermetabolism corresponding to enlarging axillary node since 12/22/2017. Highly suspicious for an atypical  distribution of metastatic disease. 2. No hypermetabolism to suggest hepatic or T12  osseous metastasis. 3. Hypermetabolic endometrial primary.   01/23/2018 Initial Diagnosis   Endometrial cancer ([place])   01/23/2018 Pathology Results   1. Lymph node, sentinel, biopsy, left external iliac - NO CARCINOMA IDENTIFIED IN ONE LYMPH NODE (0/1) - SEE COMMENT 2. Lymph nodes, regional resection, right para aortic - NO CARCINOMA IDENTIFIED IN FOUR LYMPH NODES (0/4) - SEE COMMENT 3. Lymph nodes, regional resection, right pelvic - NO CARCINOMA IDENTIFIED IN EIGHT LYMPH NODES (0/8) - SEE COMMENT 4. Cul-de-sac biopsy - METASTATIC CARCINOMA 5. Uterus +/- tubes/ovaries, neoplastic, cervix, bilateral fallopian tubes and ovaries UTERUS: - MIXED SEROUS AND ENDOMETRIOID CARCINOMA - SEROSAL IMPLANTS PRESENT - LYMPHOVASCULAR SPACE INVASION PRESENT - LEIOMYOMATA (1.5 CM; LARGEST) - SEE ONCOLOGY TABLE AND COMMENT BELOW CERVIX: - BENIGN NABOTHIAN CYSTS - NO CARCINOMA IDENTIFIED BILATERAL OVARIES: - METASTATIC CARCINOMA PRESENT ON OVARIAN SURFACE BILATERAL FALLOPIAN TUBES: - INTRALUMINAL CARCINOMA Microscopic Comment 1. -3. Cytokeratin AE1/3 was performed on the sentinel lymph nodes to exclude micrometastasis. There is no evidence of metastatic carcinoma by immunohistochemistry. 5. 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%) Histologic Grade: N/A Myometrial invasion: Estimated less than 50% myometrial invasion (0.3 cm of myometrium involved; 1.4 cm measured thickness) Uterine Serosa Involvement: Present Cervical stromal involvement: Not identified Extent of involvement of other organs: - Fallopian tube (left within the lumen) - Ovary, left (surface involvement) - Cul-de-sac Lymphovascular invasion: Present Regional Lymph Nodes: Examined: 1 Sentinel 12  Non-sentinel 13 Total Lymph nodes with metastasis: 0 Isolated tumor cells (< 0.2 mm): 0 Micrometastasis: (> 0.2 mm and < 2.0 mm): 0 Macrometastasis: (> 2.0 mm): 0 Extracapsular extension: N/A Tumor block for ancillary studies: 5E, 5B MMR / MSI testing: Pending will be reported separately Pathologic Stage Classification (pTNM, AJCC 8th edition): pT3a, pN0 FIGO Stage: IIIA COMMENT: There is tumor present on the surface of the left ovary and within the lumen of the left fallopian tube. The carcinoma appears to be mixed with the largest component being high grade serous carcinoma. Dr. Lyndon Code reviewed the case and agrees with the above diagnosis.   01/23/2018 Surgery   Surgeon: Donaciano Eva   Operation: Robotic-assisted laparoscopic total hysterectomy with bilateral salpingoophorectomy, SLN injection, mapping and biopsy, right pelvic and para-aortic lymphadenectomy  Operative Findings:  : 10-12cm bulky uterus with frank serosal involvement on posterior cul de sac peritoneum and anterior peritoneum which adhesed the bladder to the anterior uterus. Unilateral mapping on left pelvis. No grossly suspicious nodes. Normal omentum and diaphragms.    02/01/2018 Pathology Results   Lymph node, needle/core biopsy, right axilla - METASTATIC CARCINOMA - SEE COMMENT Microscopic Comment The neoplastic cells are positive for cytokeratin 7 and Pax-8 but negative for cytokeratin 5/6, cytokeratin 20, Gata-3, p63, p53 and GCDFP. Overall, the immunoprofile is consistent with metastasis from the patient's known gynecologic carcinoma.   02/01/2018 Procedure   Ultrasound-guided core biopsies of a suspicious right axillary lymph node.   02/21/2018 - 06/13/2018 Chemotherapy   The patient had carboplatin & Taxol x 6   03/26/2018 - 04/30/2018 Radiation Therapy   Radiation treatment dates:   03/26/18, 04/02/18, 04/19/18, 04/23/18 04/30/18  Site/dose: proximal Vagina, 6 Gy in 5 fractions for a total dose of 30  Gy    04/26/2018 PET scan   1. Interval resection of the  hypermetabolic endometrial primary. No discernible hypermetabolic pelvic sidewall or peritoneal metastases. 2. Stable hypermetabolic bilateral axillary lymphadenopathy. The degree of hypermetabolism in these lymph nodes remains highly suspicious for neoplasm. 3. Interval development of low level FDG uptake in 2 stable, small lymph nodes in the left groin region. This may be reactive, but close attention recommended to exclude metastatic involvement. 4. Stable non hypermetabolic low-density liver lesions and the mixed lucent and sclerotic T12 lesion is stable without hypermetabolism today.   07/09/2018 Imaging   Status post hysterectomy.  Mild axillary lymphadenopathy, improved. Nodal metastases not excluded.  Irregular wall thickening involving the anterior bladder, possibly reflecting radiation cystitis versus tumor.  Additional stable findings as above, including a 5 mm right middle lobe nodule and stable sclerosis involving the T12 vertebral body.   10/10/2018 Imaging   1. Stable exam. No findings within the abdomen or pelvis to suggest metastatic disease. 2. Unchanged appearance of sclerosis involving the T12 vertebra. 3.  Aortic Atherosclerosis (ICD10-I70.0).   10/10/2018 Imaging   Ct abdomen and pelvis 1. Stable exam. No findings within the abdomen or pelvis to suggest metastatic disease. 2. Unchanged appearance of sclerosis involving the T12 vertebra. 3.  Aortic Atherosclerosis (ICD10-I70.0).   01/16/2019 PET scan   1. Enlargement and increased activity in the left axillary, right axillary, and left inguinal adenopathy compatible with progressive malignancy. 2. Stable 4 mm right middle lobe subpleural nodule, not appreciably hypermetabolic. 3. Other imaging findings of potential clinical significance: Right kidney lower pole cyst. Aortic Atherosclerosis (ICD10-I70.0). Prominent stool throughout the colon favors  constipation. Mild chronic left maxillary sinusitis.     02/01/2019 -  Chemotherapy   The patient had pembrolizumab (KEYTRUDA) 200 mg in sodium chloride 0.9 % 50 mL chemo infusion, 200 mg, Intravenous, Once, 5 of 7 cycles Administration: 200 mg (02/01/2019), 200 mg (03/15/2019), 200 mg (04/05/2019), 200 mg (02/22/2019), 200 mg (04/26/2019)  for chemotherapy treatment.    04/25/2019 Imaging   Ct imaging 1. Interval response to therapy. Decrease in size of bilateral axillary and left inguinal lymph nodes. No new or progressive findings identified. 2. Stable sclerotic appearance of the T12 vertebra. 3. Aortic atherosclerosis. Lad coronary artery calcification noted.     REVIEW OF SYSTEMS:   Constitutional: Denies fevers, chills or abnormal weight loss Eyes: Denies blurriness of vision Ears, nose, mouth, throat, and face: Denies mucositis or sore throat  Cardiovascular: Denies palpitation, chest discomfort or lower extremity swelling Gastrointestinal:  Denies nausea, heartburn or change in bowel habits Skin: Denies abnormal skin rashes Lymphatics: Denies new lymphadenopathy or easy bruising Neurological:Denies numbness, tingling or new weaknesses Behavioral/Psych: Mood is stable, no new changes  All other systems were reviewed with the patient and are negative.  I have reviewed the past medical history, past surgical history, social history and family history with the patient and they are unchanged from previous note.  ALLERGIES:  is allergic to sulfa antibiotics and sulfamethoxazole.  MEDICATIONS:  Current Outpatient Medications  Medication Sig Dispense Refill  . Insulin Isophane & Regular Human (NOVOLIN 70/30 FLEXPEN) (70-30) 100 UNIT/ML PEN Inject 30 Units into the skin daily with breakfast. 10 pen 0  . ketoconazole (NIZORAL) 2 % cream Apply to web space twice a day for 1 week then daily for 1 month (Patient taking differently: Apply 1 application topically daily as needed for  irritation. Apply to web space twice a day for 1 week then daily for 1 month) 15 g 0  . lenvatinib 10 mg daily dose (LENVIMA, 10  MG DAILY DOSE,) capsule Take 1 capsule (10 mg total) by mouth daily. 30 capsule 11  . levothyroxine (SYNTHROID) 50 MCG tablet Take 1 tablet (50 mcg total) by mouth daily before breakfast. 30 tablet 11  . lidocaine-prilocaine (EMLA) cream Apply to affected area once 30 g 3  . lisinopril-hydrochlorothiazide (PRINZIDE,ZESTORETIC) 20-12.5 MG tablet Take 1 tablet by mouth daily. (Patient taking differently: Take 1 tablet by mouth every morning. ) 30 tablet 11  . ondansetron (ZOFRAN) 8 MG tablet Take 1 tablet (8 mg total) by mouth daily. 30 tablet 1  . OneTouch Delica Lancets 96E MISC 1 each by Other route 2 (two) times daily. Use to monitor glucose levels BID; E11.65 100 each 2  . ONETOUCH VERIO test strip 1 each by Other route 3 (three) times daily. And lancets 3/day 300 each 3  . RELION PEN NEEDLES 32G X 4 MM MISC USE AS DIRECTED 50 each 0  . simvastatin (ZOCOR) 10 MG tablet Take 10 mg by mouth at bedtime.     . Vitamin D, Ergocalciferol, 2000 units CAPS Take 2,000 Units by mouth daily.   0   No current facility-administered medications for this visit.    PHYSICAL EXAMINATION: ECOG PERFORMANCE STATUS: 1 - Symptomatic but completely ambulatory  Vitals:   05/17/19 0951  BP: 129/83  Pulse: (!) 110  Resp: 18  Temp: 97.8 F (36.6 C)  SpO2: 100%   Filed Weights   05/17/19 0951  Weight: 199 lb 3.2 oz (90.4 kg)    GENERAL:alert, no distress and comfortable SKIN: skin color, texture, turgor are normal, no rashes or significant lesions EYES: normal, Conjunctiva are pink and non-injected, sclera clear OROPHARYNX:no exudate, no erythema and lips, buccal mucosa, and tongue normal  NECK: supple, thyroid normal size, non-tender, without nodularity LYMPH:  no palpable lymphadenopathy in the cervical, axillary or inguinal LUNGS: clear to auscultation and percussion with  normal breathing effort HEART: regular rate & rhythm and no murmurs and no lower extremity edema ABDOMEN:abdomen soft, non-tender and normal bowel sounds Musculoskeletal:no cyanosis of digits and no clubbing  NEURO: alert & oriented x 3 with fluent speech, no focal motor/sensory deficits  LABORATORY DATA:  I have reviewed the data as listed    Component Value Date/Time   NA 139 04/26/2019 1050   K 3.8 04/26/2019 1050   CL 104 04/26/2019 1050   CO2 25 04/26/2019 1050   GLUCOSE 169 (H) 04/26/2019 1050   BUN 25 (H) 04/26/2019 1050   CREATININE 1.31 (H) 04/26/2019 1050   CALCIUM 9.9 04/26/2019 1050   PROT 7.2 04/26/2019 1050   ALBUMIN 3.7 04/26/2019 1050   AST 17 04/26/2019 1050   ALT 13 04/26/2019 1050   ALKPHOS 90 04/26/2019 1050   BILITOT 0.4 04/26/2019 1050   GFRNONAA 42 (L) 04/26/2019 1050   GFRAA 48 (L) 04/26/2019 1050    No results found for: SPEP, UPEP  Lab Results  Component Value Date   WBC 4.3 05/17/2019   NEUTROABS 3.2 05/17/2019   HGB 11.8 (L) 05/17/2019   HCT 36.7 05/17/2019   MCV 87.0 05/17/2019   PLT 140 (L) 05/17/2019      Chemistry      Component Value Date/Time   NA 139 04/26/2019 1050   K 3.8 04/26/2019 1050   CL 104 04/26/2019 1050   CO2 25 04/26/2019 1050   BUN 25 (H) 04/26/2019 1050   CREATININE 1.31 (H) 04/26/2019 1050      Component Value Date/Time   CALCIUM 9.9 04/26/2019  1050   ALKPHOS 90 04/26/2019 1050   AST 17 04/26/2019 1050   ALT 13 04/26/2019 1050   BILITOT 0.4 04/26/2019 1050       RADIOGRAPHIC STUDIES: I have personally reviewed the radiological images as listed and agreed with the findings in the report. CT Chest W Contrast  Result Date: 04/25/2019 CLINICAL DATA:  Restaging endometrial cancer. EXAM: CT CHEST, ABDOMEN, AND PELVIS WITH CONTRAST TECHNIQUE: Multidetector CT imaging of the chest, abdomen and pelvis was performed following the standard protocol during bolus administration of intravenous contrast. CONTRAST:  25m  OMNIPAQUE IOHEXOL 300 MG/ML  SOLN COMPARISON:  PET-CT 01/16/2019 FINDINGS: CT CHEST FINDINGS Cardiovascular: Normal heart size. Mild aortic atherosclerosis. Lad coronary artery calcification. Mediastinum/Nodes: Enlarged axillary lymph node measures 1.7 cm, image 11/2. Previously 3.0 cm. Index left axillary node measures 0.8 cm, image 18/2. Previously 1.8 cm. No supraclavicular adenopathy. No mediastinal or hilar adenopathy. Thyroid gland, trachea, and esophagus demonstrate no significant findings. Lungs/Pleura: No pleural effusion, airspace consolidation or atelectasis. Stable 4 mm perifissural right middle lobe lung nodule, image 56/6. Within the left upper lobe there is a ground-glass attenuating nodule measuring 5 mm. Unchanged from CT chest 12/22/2017. Musculoskeletal: Stable sclerotic appearance of the T12 vertebra compared with 12/22/2017. CT ABDOMEN PELVIS FINDINGS Hepatobiliary: Unchanged enhancing lesion within segments 7 measuring 0.9 cm, image 48/2. No suspicious liver lesions identified. The gallbladder is normal. No biliary dilatation. Pancreas: Unremarkable. No pancreatic ductal dilatation or surrounding inflammatory changes. Spleen: Enhancing lesion within the upper pole of spleen is unchanged from 12/22/2017 measuring 1.1 cm. Favor benign abnormality. Adrenals/Urinary Tract: Normal appearance of the adrenal glands. Simple appearing cyst arises from inferior pole of right kidney measuring 3.9 cm, image 18/7. Normal left kidney. Urinary bladder is unremarkable. Stomach/Bowel: Stomach normal. No abnormal bowel wall thickening or inflammation identified. No bowel distention. Vascular/Lymphatic: Aortic atherosclerosis. No abdominal adenopathy. Index left inguinal node measures 1 cm, image 108/2. Previously 1.8 cm. Reproductive: Status post hysterectomy. No adnexal mass identified. Other: No ascites or focal fluid collections identified. Musculoskeletal: No peritoneal nodule or omental cake. IMPRESSION: 1.  Interval response to therapy. Decrease in size of bilateral axillary and left inguinal lymph nodes. No new or progressive findings identified. 2. Stable sclerotic appearance of the T12 vertebra. 3. Aortic atherosclerosis. Lad coronary artery calcification noted. Aortic Atherosclerosis (ICD10-I70.0). Electronically Signed   By: TKerby MoorsM.D.   On: 04/25/2019 10:10   CT Abdomen Pelvis W Contrast  Result Date: 04/25/2019 CLINICAL DATA:  Restaging endometrial cancer. EXAM: CT CHEST, ABDOMEN, AND PELVIS WITH CONTRAST TECHNIQUE: Multidetector CT imaging of the chest, abdomen and pelvis was performed following the standard protocol during bolus administration of intravenous contrast. CONTRAST:  831mOMNIPAQUE IOHEXOL 300 MG/ML  SOLN COMPARISON:  PET-CT 01/16/2019 FINDINGS: CT CHEST FINDINGS Cardiovascular: Normal heart size. Mild aortic atherosclerosis. Lad coronary artery calcification. Mediastinum/Nodes: Enlarged axillary lymph node measures 1.7 cm, image 11/2. Previously 3.0 cm. Index left axillary node measures 0.8 cm, image 18/2. Previously 1.8 cm. No supraclavicular adenopathy. No mediastinal or hilar adenopathy. Thyroid gland, trachea, and esophagus demonstrate no significant findings. Lungs/Pleura: No pleural effusion, airspace consolidation or atelectasis. Stable 4 mm perifissural right middle lobe lung nodule, image 56/6. Within the left upper lobe there is a ground-glass attenuating nodule measuring 5 mm. Unchanged from CT chest 12/22/2017. Musculoskeletal: Stable sclerotic appearance of the T12 vertebra compared with 12/22/2017. CT ABDOMEN PELVIS FINDINGS Hepatobiliary: Unchanged enhancing lesion within segments 7 measuring 0.9 cm, image 48/2. No suspicious liver lesions identified.  The gallbladder is normal. No biliary dilatation. Pancreas: Unremarkable. No pancreatic ductal dilatation or surrounding inflammatory changes. Spleen: Enhancing lesion within the upper pole of spleen is unchanged from  12/22/2017 measuring 1.1 cm. Favor benign abnormality. Adrenals/Urinary Tract: Normal appearance of the adrenal glands. Simple appearing cyst arises from inferior pole of right kidney measuring 3.9 cm, image 18/7. Normal left kidney. Urinary bladder is unremarkable. Stomach/Bowel: Stomach normal. No abnormal bowel wall thickening or inflammation identified. No bowel distention. Vascular/Lymphatic: Aortic atherosclerosis. No abdominal adenopathy. Index left inguinal node measures 1 cm, image 108/2. Previously 1.8 cm. Reproductive: Status post hysterectomy. No adnexal mass identified. Other: No ascites or focal fluid collections identified. Musculoskeletal: No peritoneal nodule or omental cake. IMPRESSION: 1. Interval response to therapy. Decrease in size of bilateral axillary and left inguinal lymph nodes. No new or progressive findings identified. 2. Stable sclerotic appearance of the T12 vertebra. 3. Aortic atherosclerosis. Lad coronary artery calcification noted. Aortic Atherosclerosis (ICD10-I70.0). Electronically Signed   By: Kerby Moors M.D.   On: 04/25/2019 10:10

## 2019-05-17 NOTE — Patient Instructions (Signed)
[facility] Discharge Instructions for Patients Receiving Chemotherapy  Today you received the following Immunotherapy agent: Pembrolizumab (Keytruda)  To help prevent nausea and vomiting after your treatment, we encourage you to take your nausea medication as directed by your MD.   If you develop nausea and vomiting that is not controlled by your nausea medication, call the clinic.   BELOW ARE SYMPTOMS THAT SHOULD BE REPORTED IMMEDIATELY:  *FEVER GREATER THAN 100.5 F  *CHILLS WITH OR WITHOUT FEVER  NAUSEA AND VOMITING THAT IS NOT CONTROLLED WITH YOUR NAUSEA MEDICATION  *UNUSUAL SHORTNESS OF BREATH  *UNUSUAL BRUISING OR BLEEDING  TENDERNESS IN MOUTH AND THROAT WITH OR WITHOUT PRESENCE OF ULCERS  *URINARY PROBLEMS  *BOWEL PROBLEMS  UNUSUAL RASH Items with * indicate a potential emergency and should be followed up as soon as possible.  Feel free to call the clinic should you have any questions or concerns. The clinic phone number is [phone].  Please show the CHEMO ALERT CARD at check-in to the Emergency Department and triage nurse.  Coronavirus (COVID-19) Are you at risk?  Are you at risk for the Coronavirus (COVID-19)?  To be considered HIGH RISK for Coronavirus (COVID-19), you have to meet the following criteria:  . Traveled to [place], [place], [place], [place] or [place]; or in the [place] to [place], [place], [place], or [place]; and have fever, cough, and shortness of breath within the last 2 weeks of travel OR . Been in close contact with a person diagnosed with COVID-19 within the last 2 weeks and have fever, cough, and shortness of breath . IF YOU DO NOT MEET THESE CRITERIA, YOU ARE CONSIDERED LOW RISK FOR COVID-19.  What to do if you are HIGH RISK for COVID-19?  . If you are having a medical emergency, call 911. . Seek medical care right away. Before you go to a doctor's office, urgent care or emergency department, call ahead and  tell them about your recent travel, contact with someone diagnosed with COVID-19, and your symptoms. You should receive instructions from your physician's office regarding next steps of care.  . When you arrive at healthcare provider, tell the healthcare staff immediately you have returned from visiting [place], [place], [place], [place] or [place]; or traveled in the [place] to [place], [place], [place], or [place]; in the last two weeks or you have been in close contact with a person diagnosed with COVID-19 in the last 2 weeks.   . Tell the health care staff about your symptoms: fever, cough and shortness of breath. . After you have been seen by a medical provider, you will be either: o Tested for (COVID-19) and discharged home on quarantine except to seek medical care if symptoms worsen, and asked to  - Stay home and avoid contact with others until you get your results (4-5 days)  - Avoid travel on public transportation if possible (such as bus, train, or airplane) or o Sent to the Emergency Department by EMS for evaluation, COVID-19 testing, and possible admission depending on your condition and test results.  What to do if you are LOW RISK for COVID-19?  Reduce your risk of any infection by using the same precautions used for avoiding the common cold or flu:  . Wash your hands often with soap and warm water for at least 20 seconds.  If soap and water are not readily available, use an alcohol-based hand sanitizer with at least 60% alcohol.  .   If coughing or sneezing, cover your mouth and nose by coughing or sneezing into the elbow areas of your shirt or coat, into a tissue or into your sleeve (not your hands). . Avoid shaking hands with others and consider head nods or verbal greetings only. . Avoid touching your eyes, nose, or mouth with unwashed hands.  . Avoid close contact with people who are sick. . Avoid places or events with large numbers of people in one location, like  concerts or sporting events. . Carefully consider travel plans you have or are making. . If you are planning any travel outside or inside the [place], visit the CDC's Travelers' Health webpage for the latest health notices. . If you have some symptoms but not all symptoms, continue to monitor at home and seek medical attention if your symptoms worsen. . If you are having a medical emergency, call 911.   ADDITIONAL HEALTHCARE OPTIONS FOR PATIENTS  [facility] / e-Visit: [email or web address]         [facility]: [phone]  [facility]: [phone]                   [facility]: [phone]  

## 2019-05-17 NOTE — Assessment & Plan Note (Signed)
She is not consistent checking her blood pressure recently She has mild proteinuria today I discussed the importance of checking her blood pressure on a regular basis due to side effects of Lenvima She will try her best and we will call her next week for further follow-up

## 2019-05-17 NOTE — Assessment & Plan Note (Signed)
This is due to side effects of chemo She is not symptomatic Observe only for now

## 2019-05-17 NOTE — Progress Notes (Signed)
Per Dr. Alvy Bimler pt OK to treat today with HR of 105

## 2019-05-18 LAB — T4: T4, Total: 9.9 ug/dL (ref 4.5–12.0)

## 2019-05-20 ENCOUNTER — Telehealth: Payer: Self-pay

## 2019-05-20 ENCOUNTER — Other Ambulatory Visit: Payer: Self-pay | Admitting: Endocrinology

## 2019-05-20 NOTE — Telephone Encounter (Signed)
Please move up next appt to next available.   

## 2019-05-20 NOTE — Telephone Encounter (Signed)
Per Dr. Cordelia Pen request, please call pt to move up appt. Will address concerns during her appt.

## 2019-05-20 NOTE — Telephone Encounter (Signed)
Patient calling today stating her blood sugar is running low in the mornings around 65 and she wants to know if there should be any changes to medication-please contact patient at [phone] to get more information

## 2019-05-20 NOTE — Telephone Encounter (Signed)
Please advise 

## 2019-05-21 NOTE — Telephone Encounter (Signed)
Scheduled first available 05/29/19-FYI

## 2019-05-24 ENCOUNTER — Telehealth: Payer: Self-pay | Admitting: *Deleted

## 2019-05-24 NOTE — Telephone Encounter (Signed)
-----   Message from [name], MD sent at 05/24/2019  7:35 AM EST ----- Regarding: can you call and ask how is her BP doing?

## 2019-05-24 NOTE — Telephone Encounter (Signed)
Telephone call to patient to check status. She states she does not have a BP cuff at home. She has not started a log. She does not have a working BP cuff at home. She will go [employer] today to check her cuff and use the machine there.   Encouraged patient to start keeping a log. She states she will try but she is not good at doing these kinds of things. She states she will try harder.

## 2019-05-24 NOTE — Telephone Encounter (Signed)
Explained below to patient. She states she has been told all of this before she is "just not good at doing these kind of things" She says she will try harder. She will talk to her niece about coming to check it for her daily.

## 2019-05-24 NOTE — Telephone Encounter (Signed)
The infusion chemo alone does not work. It is not safe to take Lenvima without BP monitoring If she does not check her BP consistently, we will have to stop chemo because it is not safe We will call her again next week

## 2019-05-27 ENCOUNTER — Other Ambulatory Visit: Payer: Self-pay

## 2019-05-28 ENCOUNTER — Telehealth: Payer: Self-pay

## 2019-05-28 NOTE — Telephone Encounter (Signed)
She called and left a message. She is now checking her blood pressure daily and will record readings. Yesterday her blood pressure was 124/72.  Just FYI.

## 2019-05-29 ENCOUNTER — Encounter: Payer: Self-pay | Admitting: Endocrinology

## 2019-05-29 ENCOUNTER — Ambulatory Visit (INDEPENDENT_AMBULATORY_CARE_PROVIDER_SITE_OTHER): Payer: Medicare Other | Admitting: Endocrinology

## 2019-05-29 ENCOUNTER — Other Ambulatory Visit: Payer: Self-pay

## 2019-05-29 VITALS — BP 130/80 | HR 114 | Ht 66.0 in | Wt 199.6 lb

## 2019-05-29 DIAGNOSIS — E1121 Type 2 diabetes mellitus with diabetic nephropathy: Secondary | ICD-10-CM | POA: Diagnosis not present

## 2019-05-29 DIAGNOSIS — Z794 Long term (current) use of insulin: Secondary | ICD-10-CM

## 2019-05-29 DIAGNOSIS — E213 Hyperparathyroidism, unspecified: Secondary | ICD-10-CM | POA: Insufficient documentation

## 2019-05-29 DIAGNOSIS — N1831 Chronic kidney disease, stage 3a: Secondary | ICD-10-CM

## 2019-05-29 LAB — POCT GLYCOSYLATED HEMOGLOBIN (HGB A1C): Hemoglobin A1C: 7.2 % — AB (ref 4.0–5.6)

## 2019-05-29 MED ORDER — NOVOLIN 70/30 FLEXPEN (70-30) 100 UNIT/ML ~~LOC~~ SUPN: 26.0000 [IU] | PEN_INJECTOR | Freq: Every day | SUBCUTANEOUS | 0 refills | Status: DC

## 2019-05-29 NOTE — Patient Instructions (Addendum)
check your blood sugar twice a day.  vary the time of day when you check, between before the 3 meals, and at bedtime.  also check if you have symptoms of your blood sugar being too high or too low.  please keep a record of the readings and bring it to your next appointment here (or you can bring the meter itself).  You can write it on any piece of paper.  please call us sooner if your blood sugar goes below 70, or if you have a lot of readings over 200. On this type of insulin schedule, you should eat meals on a regular schedule (especially lunch).  If a meal is missed or significantly delayed, your blood sugar could go low.   Please reduce the insulin to 26 units with breakfast. Please continue the same levothyroxine for now. Please come back for a follow-up appointment in 2 months.

## 2019-05-29 NOTE — Progress Notes (Signed)
Subjective:    Patient ID: Krista Johnson, female    DOB: October 10, 1950, 69 y.o.   MRN: [id number]  HPI Pt returns for f/u of diabetes mellitus:  DM type: Insulin-requiring type 2.   Dx'ed: [id number] Complications: renal insuff Therapy: insulin since mid-2019 GDM: never (G0) DKA: never Severe hypoglycemia: never Pancreatitis: never Pancreatic imaging: normal on 2019 CT.   Other: she takes qd insulin, after poor results with multiple daily injections; She is finished with her chemo; she takes NPH qam, due to the pattern of her cbg's Interval history:she brings her meter with her cbg's which I have reviewed today.  cbg varies from 47-130.  There is no trend throughout the day. Pt says she seldom misses the insulin. She takes 30 units qam.   Pt also has vit-D def and Hyperparathyroidism (prob a combination of primary and secondary).  She takes vit-D, at an uncertain dosage.  No recent steroids.  Past Medical History:  Diagnosis Date  . Arthritis    right knee--- last cortisone injection 04/ 2019  . CKD (chronic kidney disease)   . Diabetes mellitus without complication [facility])    endocrinologist-  dr Loanne Drilling  . Endometrial cancer ([place])   . History of colon polyps   . Hyperlipidemia   . Hyperparathyroidism ([place])   . Hypertension     Past Surgical History:  Procedure Laterality Date  . COLONOSCOPY  last one 12-25-2017  . IR IMAGING GUIDED PORT INSERTION  02/20/2018  . ROBOTIC ASSISTED TOTAL HYSTERECTOMY WITH BILATERAL SALPINGO OOPHERECTOMY N/A 01/23/2018   Procedure: XI ROBOTIC ASSISTED TOTAL HYSTERECTOMY WITH BILATERAL SALPINGO OOPHORECTOMY;  Surgeon: Everitt Amber, MD;  Location: [facility] OR[facility];  Service: Gynecology;  Laterality: N/A;  . SENTINEL NODE BIOPSY N/A 01/23/2018   Procedure: SENTINEL NODE BIOPSY;  Surgeon: Everitt Amber, MD;  Location: [facility];  Service: Gynecology;  Laterality: N/A;    Social History   Socioeconomic History  . Marital status: Single    Spouse name: Not on file  .  Number of children: 0  . Years of education: Not on file  . Highest education level: Not on file  Occupational History  . Occupation: retired Education officer, museum  Tobacco Use  . Smoking status: Never Smoker  . Smokeless tobacco: Never Used  Substance and Sexual Activity  . Alcohol use: Not Currently    Alcohol/week: 0.0 standard drinks  . Drug use: Never  . Sexual activity: Not on file  Other Topics Concern  . Not on file  Social History Narrative  . Not on file   Social Determinants of Health   Financial Resource Strain:   . Difficulty of Paying Living Expenses: Not on file  Food Insecurity:   . Worried About Charity fundraiser in the Last Year: Not on file  . Ran Out of Food in the Last Year: Not on file  Transportation Needs:   . Lack of Transportation (Medical): Not on file  . Lack of Transportation (Non-Medical): Not on file  Physical Activity:   . Days of Exercise per Week: Not on file  . Minutes of Exercise per Session: Not on file  Stress:   . Feeling of Stress : Not on file  Social Connections:   . Frequency of Communication with Friends and Family: Not on file  . Frequency of Social Gatherings with Friends and Family: Not on file  . Attends Religious Services: Not on file  . Active Member of Clubs or Organizations: Not on file  .  Attends Archivist Meetings: Not on file  . Marital Status: Not on file  Intimate Partner Violence:   . Fear of Current or Ex-Partner: Not on file  . Emotionally Abused: Not on file  . Physically Abused: Not on file  . Sexually Abused: Not on file    Current Outpatient Medications on File Prior to Visit  Medication Sig Dispense Refill  . ketoconazole (NIZORAL) 2 % cream Apply to web space twice a day for 1 week then daily for 1 month (Patient taking differently: Apply 1 application topically daily as needed for irritation. Apply to web space twice a day for 1 week then daily for 1 month) 15 g 0  . lenvatinib 10 mg daily dose  (LENVIMA, 10 MG DAILY DOSE,) capsule Take 1 capsule (10 mg total) by mouth daily. 30 capsule 11  . levothyroxine (SYNTHROID) 50 MCG tablet Take 1 tablet (50 mcg total) by mouth daily before breakfast. 30 tablet 11  . lidocaine-prilocaine (EMLA) cream Apply to affected area once 30 g 3  . lisinopril-hydrochlorothiazide (PRINZIDE,ZESTORETIC) 20-12.5 MG tablet Take 1 tablet by mouth daily. (Patient taking differently: Take 1 tablet by mouth every morning. ) 30 tablet 11  . ondansetron (ZOFRAN) 8 MG tablet Take 1 tablet (8 mg total) by mouth daily. 30 tablet 1  . OneTouch Delica Lancets 99991111 MISC 1 each by Other route 2 (two) times daily. Use to monitor glucose levels BID; E11.65 100 each 2  . ONETOUCH VERIO test strip 1 each by Other route 3 (three) times daily. And lancets 3/day 300 each 3  . RELION PEN NEEDLES 32G X 4 MM MISC USE AS DIRECTED 50 each 1  . simvastatin (ZOCOR) 10 MG tablet Take 10 mg by mouth at bedtime.     . Vitamin D, Ergocalciferol, 2000 units CAPS Take 2,000 Units by mouth daily.   0   No current facility-administered medications on file prior to visit.    Allergies  Allergen Reactions  . Sulfa Antibiotics Nausea Only  . Sulfamethoxazole Nausea Only    Family History  Problem Relation Age of Onset  . Diabetes Mother   . Hypertension Mother   . Diabetes Sister   . Hypertension Sister   . Diabetes Maternal Uncle   . Colon cancer Paternal Aunt   . Diabetes Paternal Aunt   . Stomach cancer Neg Hx   . Rectal cancer Neg Hx   . Esophageal cancer Neg Hx   . Colon polyps Neg Hx     BP 130/80 (BP Location: Left Arm, Patient Position: Sitting, Cuff Size: Normal)   Pulse (!) 114   Ht 5\' 6"  (1.676 m)   Wt 199 lb 9.6 oz (90.5 kg)   SpO2 96%   BMI 32.22 kg/m    Review of Systems Denies LOC    Objective:   Physical Exam VITAL SIGNS:  See vs page GENERAL: no distress Pulses: dorsalis pedis intact bilat.   MSK: no deformity of the feet CV: no leg edema Skin:  no  ulcer on the feet.  normal color and temp on the feet. Neuro: sensation is intact to touch on the feet Ext: there is bilateral onychomycosis of the toenails  Lab Results  Component Value Date   CREATININE 1.47 (H) 05/17/2019   BUN 34 (H) 05/17/2019   NA 138 05/17/2019   K 3.7 05/17/2019   CL 103 05/17/2019   CO2 23 05/17/2019   Lab Results  Component Value Date   TSH 4.028 (  H) 05/17/2019   T4TOTAL 9.9 05/17/2019   Lab Results  Component Value Date   HGBA1C 7.2 (A) 05/29/2019       Assessment & Plan:  Insulin-requiring type 2 DM: overcontrolled, given this regimen, which does match insulin to her changing needs throughout the day.  renal insuff: in this setting, she needs an intermed-acting qam insulin.  Hypoglycemia: this limits aggressiveness of glycemic control.   Patient Instructions  check your blood sugar twice a day.  vary the time of day when you check, between before the 3 meals, and at bedtime.  also check if you have symptoms of your blood sugar being too high or too low.  please keep a record of the readings and bring it to your next appointment here (or you can bring the meter itself).  You can write it on any piece of paper.  please call us sooner if your blood sugar goes below 70, or if you have a lot of readings over 200. On this type of insulin schedule, you should eat meals on a regular schedule (especially lunch).  If a meal is missed or significantly delayed, your blood sugar could go low.   Please reduce the insulin to 26 units with breakfast. Please continue the same levothyroxine for now. Please come back for a follow-up appointment in 2 months.

## 2019-06-01 DIAGNOSIS — Z23 Encounter for immunization: Secondary | ICD-10-CM | POA: Diagnosis not present

## 2019-06-06 ENCOUNTER — Telehealth: Payer: Self-pay | Admitting: Hematology and Oncology

## 2019-06-06 ENCOUNTER — Telehealth: Payer: Self-pay | Admitting: *Deleted

## 2019-06-06 NOTE — Telephone Encounter (Signed)
I sent scheduling msg for next Monday

## 2019-06-06 NOTE — Telephone Encounter (Signed)
Telephone call to patient. She would like to reschedule her appointments for tomorrow. She is concerned about the weather and does not want to come in.   She reports her BP has been very good and she has been feeling great. This morning it was 118/82.   Appointments for 2/19 have been cancelled.

## 2019-06-06 NOTE — Telephone Encounter (Signed)
Scheduled appt per 2/18 sch message - pt is aware of appt date and time  - pt unsure if it will work , will check her appts on schedule and give [place] a call back if it doesn't work

## 2019-06-07 ENCOUNTER — Inpatient Hospital Stay: Payer: Medicare Other

## 2019-06-07 ENCOUNTER — Inpatient Hospital Stay: Payer: Medicare Other | Admitting: Hematology and Oncology

## 2019-06-10 ENCOUNTER — Inpatient Hospital Stay: Payer: Medicare Other

## 2019-06-10 ENCOUNTER — Inpatient Hospital Stay (HOSPITAL_BASED_OUTPATIENT_CLINIC_OR_DEPARTMENT_OTHER): Payer: Medicare Other | Admitting: Hematology and Oncology

## 2019-06-10 ENCOUNTER — Inpatient Hospital Stay: Payer: Medicare Other | Attending: Gynecology

## 2019-06-10 ENCOUNTER — Other Ambulatory Visit: Payer: Self-pay

## 2019-06-10 VITALS — HR 98

## 2019-06-10 DIAGNOSIS — Z5112 Encounter for antineoplastic immunotherapy: Secondary | ICD-10-CM | POA: Insufficient documentation

## 2019-06-10 DIAGNOSIS — N183 Chronic kidney disease, stage 3 unspecified: Secondary | ICD-10-CM

## 2019-06-10 DIAGNOSIS — Z923 Personal history of irradiation: Secondary | ICD-10-CM | POA: Diagnosis not present

## 2019-06-10 DIAGNOSIS — Z7189 Other specified counseling: Secondary | ICD-10-CM

## 2019-06-10 DIAGNOSIS — Z9221 Personal history of antineoplastic chemotherapy: Secondary | ICD-10-CM | POA: Insufficient documentation

## 2019-06-10 DIAGNOSIS — I700 Atherosclerosis of aorta: Secondary | ICD-10-CM | POA: Diagnosis not present

## 2019-06-10 DIAGNOSIS — C541 Malignant neoplasm of endometrium: Secondary | ICD-10-CM

## 2019-06-10 DIAGNOSIS — I10 Essential (primary) hypertension: Secondary | ICD-10-CM | POA: Diagnosis not present

## 2019-06-10 DIAGNOSIS — Z794 Long term (current) use of insulin: Secondary | ICD-10-CM | POA: Diagnosis not present

## 2019-06-10 DIAGNOSIS — Z79899 Other long term (current) drug therapy: Secondary | ICD-10-CM | POA: Diagnosis not present

## 2019-06-10 DIAGNOSIS — C773 Secondary and unspecified malignant neoplasm of axilla and upper limb lymph nodes: Secondary | ICD-10-CM

## 2019-06-10 DIAGNOSIS — I129 Hypertensive chronic kidney disease with stage 1 through stage 4 chronic kidney disease, or unspecified chronic kidney disease: Secondary | ICD-10-CM | POA: Diagnosis not present

## 2019-06-10 LAB — CBC WITH DIFFERENTIAL (CANCER CENTER ONLY)
Abs Immature Granulocytes: 0.02 10*3/uL (ref 0.00–0.07)
Basophils Absolute: 0 10*3/uL (ref 0.0–0.1)
Basophils Relative: 0 %
Eosinophils Absolute: 0 10*3/uL (ref 0.0–0.5)
Eosinophils Relative: 1 %
HCT: 35.4 % — ABNORMAL LOW (ref 36.0–46.0)
Hemoglobin: 11.6 g/dL — ABNORMAL LOW (ref 12.0–15.0)
Immature Granulocytes: 0 %
Lymphocytes Relative: 36 %
Lymphs Abs: 1.7 10*3/uL (ref 0.7–4.0)
MCH: 28 pg (ref 26.0–34.0)
MCHC: 32.8 g/dL (ref 30.0–36.0)
MCV: 85.5 fL (ref 80.0–100.0)
Monocytes Absolute: 0.3 10*3/uL (ref 0.1–1.0)
Monocytes Relative: 6 %
Neutro Abs: 2.7 10*3/uL (ref 1.7–7.7)
Neutrophils Relative %: 57 %
Platelet Count: 230 10*3/uL (ref 150–400)
RBC: 4.14 MIL/uL (ref 3.87–5.11)
RDW: 14.3 % (ref 11.5–15.5)
WBC Count: 4.8 10*3/uL (ref 4.0–10.5)
nRBC: 0 % (ref 0.0–0.2)

## 2019-06-10 LAB — CMP (CANCER CENTER ONLY)
ALT: 17 U/L (ref 0–44)
AST: 15 U/L (ref 15–41)
Albumin: 3.6 g/dL (ref 3.5–5.0)
Alkaline Phosphatase: 70 U/L (ref 38–126)
Anion gap: 10 (ref 5–15)
BUN: 30 mg/dL — ABNORMAL HIGH (ref 8–23)
CO2: 23 mmol/L (ref 22–32)
Calcium: 9.6 mg/dL (ref 8.9–10.3)
Chloride: 108 mmol/L (ref 98–111)
Creatinine: 1.48 mg/dL — ABNORMAL HIGH (ref 0.44–1.00)
GFR, Est AFR Am: 42 mL/min — ABNORMAL LOW (ref >60)
GFR, Estimated: 36 mL/min — ABNORMAL LOW (ref >60)
Glucose, Bld: 158 mg/dL — ABNORMAL HIGH (ref 70–99)
Potassium: 3.7 mmol/L (ref 3.5–5.1)
Sodium: 141 mmol/L (ref 135–145)
Total Bilirubin: 0.4 mg/dL (ref 0.3–1.2)
Total Protein: 7.1 g/dL (ref 6.5–8.1)

## 2019-06-10 LAB — TSH: TSH: 2.043 u[IU]/mL (ref 0.308–3.960)

## 2019-06-10 LAB — TOTAL PROTEIN, URINE DIPSTICK: Protein, ur: <30 mg/dL — AB

## 2019-06-10 MED ORDER — SODIUM CHLORIDE 0.9 % IV SOLN
200.0000 mg | Freq: Once | INTRAVENOUS | Status: AC
  Administered 2019-06-10: 15:00:00 200 mg via INTRAVENOUS
  Filled 2019-06-10: qty 8

## 2019-06-10 MED ORDER — SODIUM CHLORIDE 0.9% FLUSH
10.0000 mL | INTRAVENOUS | Status: DC | PRN
  Administered 2019-06-10: 10 mL
  Filled 2019-06-10: qty 10

## 2019-06-10 MED ORDER — HEPARIN SOD (PORK) LOCK FLUSH 100 UNIT/ML IV SOLN
500.0000 [IU] | Freq: Once | INTRAVENOUS | Status: AC | PRN
  Administered 2019-06-10: 16:00:00 500 [IU]
  Filled 2019-06-10: qty 5

## 2019-06-10 MED ORDER — SODIUM CHLORIDE 0.9% FLUSH
10.0000 mL | Freq: Once | INTRAVENOUS | Status: AC
  Administered 2019-06-10: 13:00:00 10 mL
  Filled 2019-06-10: qty 10

## 2019-06-10 MED ORDER — SODIUM CHLORIDE 0.9 % IV SOLN
Freq: Once | INTRAVENOUS | Status: AC
  Filled 2019-06-10: qty 250

## 2019-06-10 NOTE — Patient Instructions (Signed)
[facility] Discharge Instructions for Patients Receiving Chemotherapy  Today you received the following chemotherapy agents: pembrolizumab.  To help prevent nausea and vomiting after your treatment, we encourage you to take your nausea medication as directed.   If you develop nausea and vomiting that is not controlled by your nausea medication, call the clinic.   BELOW ARE SYMPTOMS THAT SHOULD BE REPORTED IMMEDIATELY:  *FEVER GREATER THAN 100.5 F  *CHILLS WITH OR WITHOUT FEVER  NAUSEA AND VOMITING THAT IS NOT CONTROLLED WITH YOUR NAUSEA MEDICATION  *UNUSUAL SHORTNESS OF BREATH  *UNUSUAL BRUISING OR BLEEDING  TENDERNESS IN MOUTH AND THROAT WITH OR WITHOUT PRESENCE OF ULCERS  *URINARY PROBLEMS  *BOWEL PROBLEMS  UNUSUAL RASH Items with * indicate a potential emergency and should be followed up as soon as possible.  Feel free to call the clinic should you have any questions or concerns. The clinic phone number is [phone].  Please show the CHEMO ALERT CARD at check-in to the Emergency Department and triage nurse.   

## 2019-06-10 NOTE — Patient Instructions (Signed)

## 2019-06-11 ENCOUNTER — Encounter: Payer: Self-pay | Admitting: Hematology and Oncology

## 2019-06-11 ENCOUNTER — Telehealth: Payer: Self-pay | Admitting: Hematology and Oncology

## 2019-06-11 LAB — T4: T4, Total: 9.3 ug/dL (ref 4.5–12.0)

## 2019-06-11 NOTE — Telephone Encounter (Signed)
Scheduled 3/15 appts per 2/23 sch msg. Left pt a voicemail with appt details and mailed out a reminder letter and calendar

## 2019-06-11 NOTE — Assessment & Plan Note (Signed)
So far, she tolerated treatment very well without major side effects Continue similar treatment indefinitely I plan to space out her next future imaging to 4 to 6 months, due maybe around May 2021 the earliest

## 2019-06-11 NOTE — Assessment & Plan Note (Signed)
She has stable chronic kidney disease We will monitor carefully

## 2019-06-11 NOTE — Assessment & Plan Note (Signed)
Her blood pressure control is improved She will continue current antihypertensives

## 2019-06-11 NOTE — Progress Notes (Signed)
[place] OFFICE PROGRESS NOTE  Patient Care Team: Jonathon Jordan, MD as PCP - General (Family Medicine)  ASSESSMENT & PLAN:  Endometrial cancer [facility]) So far, she tolerated treatment very well without major side effects Continue similar treatment indefinitely I plan to space out her next future imaging to 4 to 6 months, due maybe around May 2021 the earliest  Essential hypertension Her blood pressure control is improved She will continue current antihypertensives  Chronic kidney disease (CKD), stage III (moderate) ([place]) She has stable chronic kidney disease We will monitor carefully   No orders of the defined types were placed in this encounter.   All questions were answered. The patient knows to call the clinic with any problems, questions or concerns. The total time spent in the appointment was 20 minutes encounter with patients including review of chart and various tests results, discussions about plan of care and coordination of care plan   [name], MD 06/11/2019 9:07 AM  INTERVAL HISTORY: Please see below for problem oriented charting. 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 Leo Grosser who obtained an endometrial biopsy showing poorly differentiated endometrial carcinoma and negative endocervical curettage   12/12/2017 Imaging   MAMMOGRAM FINDINGS: In the right axilla, a  possible mass warrants further evaluation. In the left breast, no findings suspicious for malignancy.  Images were processed with CAD.  IMPRESSION: Further evaluation is suggested for possible mass in the right axilla.    12/24/2017 Imaging   Ct scan chest, abdomen and pelvis 1. Marked thickening of the endometrium (42 mm) compatible with known primary endometrial malignancy. No evidence of extrauterine invasion. 2. No pelvic or retroperitoneal adenopathy. 3. Right middle lobe 4 mm solid pulmonary nodule, for which follow-up chest CT is advised in 3-6 months. 4. Several findings that are equivocal for distant metastatic disease. Vaguely nodular heterogeneous hyperenhancement in the peripheral right liver lobe, which could represent benign transient perfusional phenomena, with underlying liver lesions not entirely excluded. Mildly sclerotic T12 vertebral lesion. Mildly enlarged right axillary lymph node. The best single test to further evaluate these findings would be a PET-CT. Alternative tests include bone scan or thoracic MRI without and with IV contrast for the T12 osseous lesion, MRI abdomen without and with IV contrast for the liver findings, and diagnostic mammographic evaluation for the right axillary node. 5.  Aortic Atherosclerosis (ICD10-I70.0).    01/09/2018 PET scan   1. Moderate hypermetabolism corresponding to enlarging axillary node since 12/22/2017. Highly suspicious for an atypical distribution of metastatic disease. 2. No hypermetabolism to suggest hepatic or T12 osseous metastasis. 3. Hypermetabolic endometrial primary.   01/23/2018 Initial Diagnosis   Endometrial cancer ([place])   01/23/2018 Pathology Results   1. Lymph node, sentinel, biopsy, left external iliac - NO CARCINOMA IDENTIFIED IN ONE LYMPH NODE (0/1) - SEE COMMENT 2. Lymph nodes, regional resection, right para aortic - NO CARCINOMA IDENTIFIED IN FOUR LYMPH NODES (0/4) - SEE COMMENT 3. Lymph nodes, regional  resection, right pelvic - NO CARCINOMA IDENTIFIED IN EIGHT LYMPH NODES (0/8) - SEE COMMENT 4. Cul-de-sac biopsy - METASTATIC CARCINOMA 5. Uterus +/- tubes/ovaries, neoplastic, cervix, bilateral fallopian tubes and ovaries UTERUS: - MIXED SEROUS AND ENDOMETRIOID CARCINOMA - SEROSAL IMPLANTS PRESENT - LYMPHOVASCULAR SPACE INVASION PRESENT - LEIOMYOMATA (1.5 CM; LARGEST) - SEE ONCOLOGY TABLE AND COMMENT BELOW CERVIX: - BENIGN NABOTHIAN CYSTS - NO CARCINOMA IDENTIFIED BILATERAL OVARIES: - METASTATIC CARCINOMA PRESENT ON OVARIAN SURFACE BILATERAL FALLOPIAN TUBES: - INTRALUMINAL CARCINOMA Microscopic Comment 1. -3. Cytokeratin AE1/3 was performed on the sentinel lymph nodes to exclude micrometastasis. There is no evidence of metastatic carcinoma by immunohistochemistry. 5. 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%) Histologic Grade: N/A Myometrial invasion: Estimated less than 50% myometrial invasion (0.3 cm of myometrium involved; 1.4 cm measured thickness) Uterine Serosa Involvement: Present Cervical stromal involvement: Not identified Extent of involvement of other organs: - Fallopian tube (left within the lumen) - Ovary, left (surface involvement) - Cul-de-sac Lymphovascular invasion: Present Regional Lymph Nodes: Examined: 1 Sentinel 12 Non-sentinel 13 Total Lymph nodes with metastasis: 0 Isolated tumor cells (< 0.2 mm): 0 Micrometastasis: (> 0.2 mm and < 2.0 mm): 0 Macrometastasis: (> 2.0 mm): 0 Extracapsular extension: N/A Tumor block for ancillary studies: 5E, 5B MMR / MSI testing: Pending will be reported separately Pathologic Stage Classification (pTNM, AJCC 8th edition): pT3a, pN0 FIGO Stage: IIIA COMMENT: There is tumor present on  the surface of the left ovary and within the lumen of the left fallopian tube. The carcinoma appears to be mixed with the largest component being high grade serous carcinoma. Dr. Lyndon Code reviewed the case and agrees with the above diagnosis.   01/23/2018 Surgery   Surgeon: Donaciano Eva   Operation: Robotic-assisted laparoscopic total hysterectomy with bilateral salpingoophorectomy, SLN injection, mapping and biopsy, right pelvic and para-aortic lymphadenectomy  Operative Findings:  : 10-12cm bulky uterus with frank serosal involvement on posterior cul de sac peritoneum and anterior peritoneum which adhesed the bladder to the anterior uterus. Unilateral mapping on left pelvis. No grossly suspicious nodes. Normal omentum and diaphragms.    02/01/2018 Pathology Results   Lymph node, needle/core biopsy, right axilla - METASTATIC CARCINOMA - SEE COMMENT Microscopic Comment The neoplastic cells are positive for cytokeratin 7 and Pax-8 but negative for cytokeratin 5/6, cytokeratin 20, Gata-3, p63, p53 and GCDFP. Overall, the immunoprofile is consistent with metastasis from the patient's known gynecologic carcinoma.   02/01/2018 Procedure   Ultrasound-guided core biopsies of a suspicious right axillary lymph node.   02/21/2018 - 06/13/2018 Chemotherapy   The patient had carboplatin & Taxol x 6   03/26/2018 - 04/30/2018 Radiation Therapy   Radiation treatment dates:   03/26/18, 04/02/18, 04/19/18, 04/23/18 04/30/18  Site/dose: proximal Vagina, 6 Gy in 5 fractions for a total dose of 30 Gy    04/26/2018 PET scan   1. Interval resection of the hypermetabolic endometrial primary. No discernible hypermetabolic pelvic sidewall or peritoneal metastases. 2. Stable hypermetabolic bilateral axillary lymphadenopathy. The degree of hypermetabolism in these lymph nodes remains highly suspicious for neoplasm. 3. Interval development of low level FDG uptake in 2 stable, small lymph nodes in the left groin  region. This may be reactive, but close attention recommended to exclude metastatic involvement. 4. Stable non hypermetabolic low-density liver lesions and the mixed lucent and sclerotic T12 lesion is stable without hypermetabolism  today.   07/09/2018 Imaging   Status post hysterectomy.  Mild axillary lymphadenopathy, improved. Nodal metastases not excluded.  Irregular wall thickening involving the anterior bladder, possibly reflecting radiation cystitis versus tumor.  Additional stable findings as above, including a 5 mm right middle lobe nodule and stable sclerosis involving the T12 vertebral body.   10/10/2018 Imaging   1. Stable exam. No findings within the abdomen or pelvis to suggest metastatic disease. 2. Unchanged appearance of sclerosis involving the T12 vertebra. 3.  Aortic Atherosclerosis (ICD10-I70.0).   10/10/2018 Imaging   Ct abdomen and pelvis 1. Stable exam. No findings within the abdomen or pelvis to suggest metastatic disease. 2. Unchanged appearance of sclerosis involving the T12 vertebra. 3.  Aortic Atherosclerosis (ICD10-I70.0).   01/16/2019 PET scan   1. Enlargement and increased activity in the left axillary, right axillary, and left inguinal adenopathy compatible with progressive malignancy. 2. Stable 4 mm right middle lobe subpleural nodule, not appreciably hypermetabolic. 3. Other imaging findings of potential clinical significance: Right kidney lower pole cyst. Aortic Atherosclerosis (ICD10-I70.0). Prominent stool throughout the colon favors constipation. Mild chronic left maxillary sinusitis.     02/01/2019 -  Chemotherapy   The patient had pembrolizumab (KEYTRUDA) 200 mg in sodium chloride 0.9 % 50 mL chemo infusion, 200 mg, Intravenous, Once, 7 of 7 cycles Administration: 200 mg (02/01/2019), 200 mg (03/15/2019), 200 mg (04/05/2019), 200 mg (05/17/2019), 200 mg (02/22/2019), 200 mg (04/26/2019), 200 mg (06/10/2019)  for chemotherapy treatment.    04/25/2019  Imaging   Ct imaging 1. Interval response to therapy. Decrease in size of bilateral axillary and left inguinal lymph nodes. No new or progressive findings identified. 2. Stable sclerotic appearance of the T12 vertebra. 3. Aortic atherosclerosis. Lad coronary artery calcification noted.     REVIEW OF SYSTEMS:   Constitutional: Denies fevers, chills or abnormal weight loss Eyes: Denies blurriness of vision Ears, nose, mouth, throat, and face: Denies mucositis or sore throat Respiratory: Denies cough, dyspnea or wheezes Cardiovascular: Denies palpitation, chest discomfort or lower extremity swelling Gastrointestinal:  Denies nausea, heartburn or change in bowel habits Skin: Denies abnormal skin rashes Lymphatics: Denies new lymphadenopathy or easy bruising Neurological:Denies numbness, tingling or new weaknesses Behavioral/Psych: Mood is stable, no new changes  All other systems were reviewed with the patient and are negative.  I have reviewed the past medical history, past surgical history, social history and family history with the patient and they are unchanged from previous note.  ALLERGIES:  is allergic to sulfa antibiotics and sulfamethoxazole.  MEDICATIONS:  Current Outpatient Medications  Medication Sig Dispense Refill  . Insulin Isophane & Regular Human (NOVOLIN 70/30 FLEXPEN) (70-30) 100 UNIT/ML PEN Inject 26 Units into the skin daily with breakfast. 10 pen 0  . ketoconazole (NIZORAL) 2 % cream Apply to web space twice a day for 1 week then daily for 1 month (Patient taking differently: Apply 1 application topically daily as needed for irritation. Apply to web space twice a day for 1 week then daily for 1 month) 15 g 0  . lenvatinib 10 mg daily dose (LENVIMA, 10 MG DAILY DOSE,) capsule Take 1 capsule (10 mg total) by mouth daily. 30 capsule 11  . levothyroxine (SYNTHROID) 50 MCG tablet Take 1 tablet (50 mcg total) by mouth daily before breakfast. 30 tablet 11  .  lidocaine-prilocaine (EMLA) cream Apply to affected area once 30 g 3  . lisinopril-hydrochlorothiazide (PRINZIDE,ZESTORETIC) 20-12.5 MG tablet Take 1 tablet by mouth daily. (Patient taking differently: Take 1 tablet  by mouth every morning. ) 30 tablet 11  . ondansetron (ZOFRAN) 8 MG tablet Take 1 tablet (8 mg total) by mouth daily. 30 tablet 1  . OneTouch Delica Lancets 98Y MISC 1 each by Other route 2 (two) times daily. Use to monitor glucose levels BID; E11.65 100 each 2  . ONETOUCH VERIO test strip 1 each by Other route 3 (three) times daily. And lancets 3/day 300 each 3  . RELION PEN NEEDLES 32G X 4 MM MISC USE AS DIRECTED 50 each 1  . simvastatin (ZOCOR) 10 MG tablet Take 10 mg by mouth at bedtime.     . Vitamin D, Ergocalciferol, 2000 units CAPS Take 2,000 Units by mouth daily.   0   No current facility-administered medications for this visit.    PHYSICAL EXAMINATION: ECOG PERFORMANCE STATUS: 0 - Asymptomatic  Vitals:   06/10/19 1405  BP: 119/70  Pulse: (!) 106  Resp: 18  Temp: 97.8 F (36.6 C)  SpO2: 97%   Filed Weights   06/10/19 1405  Weight: 198 lb 3.2 oz (89.9 kg)    GENERAL:alert, no distress and comfortable SKIN: skin color, texture, turgor are normal, no rashes or significant lesions EYES: normal, Conjunctiva are pink and non-injected, sclera clear OROPHARYNX:no exudate, no erythema and lips, buccal mucosa, and tongue normal  NECK: supple, thyroid normal size, non-tender, without nodularity LYMPH:  no palpable lymphadenopathy in the cervical, axillary or inguinal LUNGS: clear to auscultation and percussion with normal breathing effort HEART: regular rate & rhythm and no murmurs and no lower extremity edema ABDOMEN:abdomen soft, non-tender and normal bowel sounds Musculoskeletal:no cyanosis of digits and no clubbing  NEURO: alert & oriented x 3 with fluent speech, no focal motor/sensory deficits  LABORATORY DATA:  I have reviewed the data as listed     Component Value Date/Time   NA 141 06/10/2019 1337   K 3.7 06/10/2019 1337   CL 108 06/10/2019 1337   CO2 23 06/10/2019 1337   GLUCOSE 158 (H) 06/10/2019 1337   BUN 30 (H) 06/10/2019 1337   CREATININE 1.48 (H) 06/10/2019 1337   CALCIUM 9.6 06/10/2019 1337   PROT 7.1 06/10/2019 1337   ALBUMIN 3.6 06/10/2019 1337   AST 15 06/10/2019 1337   ALT 17 06/10/2019 1337   ALKPHOS 70 06/10/2019 1337   BILITOT 0.4 06/10/2019 1337   GFRNONAA 36 (L) 06/10/2019 1337   GFRAA 42 (L) 06/10/2019 1337    No results found for: SPEP, UPEP  Lab Results  Component Value Date   WBC 4.8 06/10/2019   NEUTROABS 2.7 06/10/2019   HGB 11.6 (L) 06/10/2019   HCT 35.4 (L) 06/10/2019   MCV 85.5 06/10/2019   PLT 230 06/10/2019      Chemistry      Component Value Date/Time   NA 141 06/10/2019 1337   K 3.7 06/10/2019 1337   CL 108 06/10/2019 1337   CO2 23 06/10/2019 1337   BUN 30 (H) 06/10/2019 1337   CREATININE 1.48 (H) 06/10/2019 1337      Component Value Date/Time   CALCIUM 9.6 06/10/2019 1337   ALKPHOS 70 06/10/2019 1337   AST 15 06/10/2019 1337   ALT 17 06/10/2019 1337   BILITOT 0.4 06/10/2019 1337       RADIOGRAPHIC STUDIES: I have personally reviewed the radiological images as listed and agreed with the findings in the report. No results found.

## 2019-06-26 ENCOUNTER — Ambulatory Visit: Payer: Medicare Other | Admitting: Podiatry

## 2019-06-28 ENCOUNTER — Other Ambulatory Visit: Payer: Self-pay

## 2019-06-28 ENCOUNTER — Ambulatory Visit (INDEPENDENT_AMBULATORY_CARE_PROVIDER_SITE_OTHER): Payer: Medicare Other | Admitting: Podiatry

## 2019-06-28 DIAGNOSIS — B351 Tinea unguium: Secondary | ICD-10-CM | POA: Diagnosis not present

## 2019-06-28 DIAGNOSIS — M79672 Pain in left foot: Secondary | ICD-10-CM | POA: Diagnosis not present

## 2019-06-28 DIAGNOSIS — M79675 Pain in left toe(s): Secondary | ICD-10-CM

## 2019-06-28 DIAGNOSIS — M79674 Pain in right toe(s): Secondary | ICD-10-CM | POA: Diagnosis not present

## 2019-06-28 DIAGNOSIS — T451X5A Adverse effect of antineoplastic and immunosuppressive drugs, initial encounter: Secondary | ICD-10-CM

## 2019-06-28 DIAGNOSIS — G620 Drug-induced polyneuropathy: Secondary | ICD-10-CM

## 2019-06-28 DIAGNOSIS — E119 Type 2 diabetes mellitus without complications: Secondary | ICD-10-CM

## 2019-06-28 NOTE — Patient Instructions (Signed)

## 2019-06-29 DIAGNOSIS — Z23 Encounter for immunization: Secondary | ICD-10-CM | POA: Diagnosis not present

## 2019-07-01 ENCOUNTER — Inpatient Hospital Stay: Payer: Medicare Other | Attending: Gynecology | Admitting: Hematology and Oncology

## 2019-07-01 ENCOUNTER — Inpatient Hospital Stay: Payer: Medicare Other

## 2019-07-01 ENCOUNTER — Other Ambulatory Visit: Payer: Self-pay

## 2019-07-01 VITALS — HR 98

## 2019-07-01 DIAGNOSIS — C541 Malignant neoplasm of endometrium: Secondary | ICD-10-CM

## 2019-07-01 DIAGNOSIS — E1165 Type 2 diabetes mellitus with hyperglycemia: Secondary | ICD-10-CM | POA: Insufficient documentation

## 2019-07-01 DIAGNOSIS — N183 Chronic kidney disease, stage 3 unspecified: Secondary | ICD-10-CM | POA: Insufficient documentation

## 2019-07-01 DIAGNOSIS — Z7189 Other specified counseling: Secondary | ICD-10-CM

## 2019-07-01 DIAGNOSIS — E1121 Type 2 diabetes mellitus with diabetic nephropathy: Secondary | ICD-10-CM | POA: Diagnosis not present

## 2019-07-01 DIAGNOSIS — N1831 Chronic kidney disease, stage 3a: Secondary | ICD-10-CM

## 2019-07-01 DIAGNOSIS — C773 Secondary and unspecified malignant neoplasm of axilla and upper limb lymph nodes: Secondary | ICD-10-CM

## 2019-07-01 DIAGNOSIS — E1122 Type 2 diabetes mellitus with diabetic chronic kidney disease: Secondary | ICD-10-CM | POA: Diagnosis not present

## 2019-07-01 DIAGNOSIS — Z923 Personal history of irradiation: Secondary | ICD-10-CM | POA: Insufficient documentation

## 2019-07-01 DIAGNOSIS — Z79899 Other long term (current) drug therapy: Secondary | ICD-10-CM | POA: Insufficient documentation

## 2019-07-01 DIAGNOSIS — Z9221 Personal history of antineoplastic chemotherapy: Secondary | ICD-10-CM | POA: Insufficient documentation

## 2019-07-01 DIAGNOSIS — Z5112 Encounter for antineoplastic immunotherapy: Secondary | ICD-10-CM | POA: Insufficient documentation

## 2019-07-01 DIAGNOSIS — Z794 Long term (current) use of insulin: Secondary | ICD-10-CM | POA: Diagnosis not present

## 2019-07-01 DIAGNOSIS — D638 Anemia in other chronic diseases classified elsewhere: Secondary | ICD-10-CM

## 2019-07-01 LAB — CMP (CANCER CENTER ONLY)
ALT: 9 U/L (ref 0–44)
AST: 15 U/L (ref 15–41)
Albumin: 3.6 g/dL (ref 3.5–5.0)
Alkaline Phosphatase: 72 U/L (ref 38–126)
Anion gap: 11 (ref 5–15)
BUN: 28 mg/dL — ABNORMAL HIGH (ref 8–23)
CO2: 22 mmol/L (ref 22–32)
Calcium: 9.7 mg/dL (ref 8.9–10.3)
Chloride: 105 mmol/L (ref 98–111)
Creatinine: 1.61 mg/dL — ABNORMAL HIGH (ref 0.44–1.00)
GFR, Est AFR Am: 38 mL/min — ABNORMAL LOW (ref >60)
GFR, Estimated: 33 mL/min — ABNORMAL LOW (ref >60)
Glucose, Bld: 217 mg/dL — ABNORMAL HIGH (ref 70–99)
Potassium: 3.7 mmol/L (ref 3.5–5.1)
Sodium: 138 mmol/L (ref 135–145)
Total Bilirubin: 0.6 mg/dL (ref 0.3–1.2)
Total Protein: 6.9 g/dL (ref 6.5–8.1)

## 2019-07-01 LAB — TOTAL PROTEIN, URINE DIPSTICK: Protein, ur: <30 mg/dL — AB

## 2019-07-01 LAB — CBC WITH DIFFERENTIAL (CANCER CENTER ONLY)
Abs Immature Granulocytes: 0.01 10*3/uL (ref 0.00–0.07)
Basophils Absolute: 0 10*3/uL (ref 0.0–0.1)
Basophils Relative: 0 %
Eosinophils Absolute: 0.1 10*3/uL (ref 0.0–0.5)
Eosinophils Relative: 2 %
HCT: 35.5 % — ABNORMAL LOW (ref 36.0–46.0)
Hemoglobin: 11.6 g/dL — ABNORMAL LOW (ref 12.0–15.0)
Immature Granulocytes: 0 %
Lymphocytes Relative: 26 %
Lymphs Abs: 1 10*3/uL (ref 0.7–4.0)
MCH: 28.9 pg (ref 26.0–34.0)
MCHC: 32.7 g/dL (ref 30.0–36.0)
MCV: 88.3 fL (ref 80.0–100.0)
Monocytes Absolute: 0.3 10*3/uL (ref 0.1–1.0)
Monocytes Relative: 7 %
Neutro Abs: 2.6 10*3/uL (ref 1.7–7.7)
Neutrophils Relative %: 65 %
Platelet Count: 207 10*3/uL (ref 150–400)
RBC: 4.02 MIL/uL (ref 3.87–5.11)
RDW: 14.2 % (ref 11.5–15.5)
WBC Count: 3.9 10*3/uL — ABNORMAL LOW (ref 4.0–10.5)
nRBC: 0 % (ref 0.0–0.2)

## 2019-07-01 LAB — TSH: TSH: 2.48 u[IU]/mL (ref 0.308–3.960)

## 2019-07-01 MED ORDER — SODIUM CHLORIDE 0.9% FLUSH
10.0000 mL | Freq: Once | INTRAVENOUS | Status: AC
  Administered 2019-07-01: 10 mL
  Filled 2019-07-01: qty 10

## 2019-07-01 MED ORDER — SODIUM CHLORIDE 0.9% FLUSH
10.0000 mL | INTRAVENOUS | Status: DC | PRN
  Administered 2019-07-01: 10 mL
  Filled 2019-07-01: qty 10

## 2019-07-01 MED ORDER — SODIUM CHLORIDE 0.9 % IV SOLN
200.0000 mg | Freq: Once | INTRAVENOUS | Status: AC
  Administered 2019-07-01: 200 mg via INTRAVENOUS
  Filled 2019-07-01: qty 8

## 2019-07-01 MED ORDER — SODIUM CHLORIDE 0.9 % IV SOLN
Freq: Once | INTRAVENOUS | Status: AC
  Filled 2019-07-01: qty 250

## 2019-07-01 MED ORDER — HEPARIN SOD (PORK) LOCK FLUSH 100 UNIT/ML IV SOLN
500.0000 [IU] | Freq: Once | INTRAVENOUS | Status: AC | PRN
  Administered 2019-07-01: 500 [IU]
  Filled 2019-07-01: qty 5

## 2019-07-01 NOTE — Patient Instructions (Signed)
[facility] Discharge Instructions for Patients Receiving Chemotherapy  Today you received the following chemotherapy agents: pembrolizumab.  To help prevent nausea and vomiting after your treatment, we encourage you to take your nausea medication as directed.   If you develop nausea and vomiting that is not controlled by your nausea medication, call the clinic.   BELOW ARE SYMPTOMS THAT SHOULD BE REPORTED IMMEDIATELY:  *FEVER GREATER THAN 100.5 F  *CHILLS WITH OR WITHOUT FEVER  NAUSEA AND VOMITING THAT IS NOT CONTROLLED WITH YOUR NAUSEA MEDICATION  *UNUSUAL SHORTNESS OF BREATH  *UNUSUAL BRUISING OR BLEEDING  TENDERNESS IN MOUTH AND THROAT WITH OR WITHOUT PRESENCE OF ULCERS  *URINARY PROBLEMS  *BOWEL PROBLEMS  UNUSUAL RASH Items with * indicate a potential emergency and should be followed up as soon as possible.  Feel free to call the clinic should you have any questions or concerns. The clinic phone number is [phone].  Please show the CHEMO ALERT CARD at check-in to the Emergency Department and triage nurse.   

## 2019-07-01 NOTE — Progress Notes (Signed)
Okay to proceed with treatment, Keytruda, with creatinine level of 1.61 per Dr. Alvy Bimler.

## 2019-07-02 ENCOUNTER — Ambulatory Visit: Payer: Medicare Other | Admitting: Endocrinology

## 2019-07-02 ENCOUNTER — Encounter: Payer: Self-pay | Admitting: Hematology and Oncology

## 2019-07-02 LAB — T4: T4, Total: 8.9 ug/dL (ref 4.5–12.0)

## 2019-07-02 NOTE — Assessment & Plan Note (Addendum)
This is likely multifactorial related to anemia chronic disease It is not likely due to her treatment She is not symptomatic.  Observe only.   

## 2019-07-02 NOTE — Assessment & Plan Note (Signed)
She has stable chronic kidney disease We will monitor carefully

## 2019-07-02 NOTE — Progress Notes (Signed)
[place] OFFICE PROGRESS NOTE  Patient Care Team: Jonathon Jordan, MD as PCP - General (Family Medicine)  ASSESSMENT & PLAN:  Endometrial cancer [facility]) So far, she tolerated treatment very well without major side effects Continue similar treatment indefinitely I plan to space out her next future imaging to 4 to 6 months, due maybe around May 2021 the earliest  Chronic kidney disease (CKD), stage III (moderate) ([place]) She has stable chronic kidney disease We will monitor carefully  Anemia, chronic disease This is likely multifactorial related to anemia chronic disease It is not likely due to her treatment She is not symptomatic.  Observe only.    Diabetes ([place]) I noted that she has intermittently high blood sugar We discussed the importance of getting her blood sugar under control while on treatment   No orders of the defined types were placed in this encounter.   All questions were answered. The patient knows to call the clinic with any problems, questions or concerns. The total time spent in the appointment was 20 minutes encounter with patients including review of chart and various tests results, discussions about plan of care and coordination of care plan   [name], MD 07/02/2019 5:19 PM  INTERVAL HISTORY: Please see below for problem oriented charting. 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 Leo Grosser who obtained an  endometrial biopsy showing poorly differentiated endometrial carcinoma and negative endocervical curettage   12/12/2017 Imaging   MAMMOGRAM FINDINGS: In the right axilla, a possible mass warrants further evaluation. In the left breast, no findings suspicious for malignancy.  Images were processed with CAD.  IMPRESSION: Further evaluation is suggested for possible mass in the right axilla.    12/24/2017 Imaging   Ct scan chest, abdomen and pelvis 1. Marked thickening of the endometrium (42 mm) compatible with known primary endometrial malignancy. No evidence of extrauterine invasion. 2. No pelvic or retroperitoneal adenopathy. 3. Right middle lobe 4 mm solid pulmonary nodule, for which follow-up chest CT is advised in 3-6 months. 4. Several findings that are equivocal for distant metastatic disease. Vaguely nodular heterogeneous hyperenhancement in the peripheral right liver lobe, which could represent benign transient perfusional phenomena, with underlying liver lesions not entirely excluded. Mildly sclerotic T12 vertebral lesion. Mildly enlarged right axillary lymph node. The best single test to further evaluate these findings would be a PET-CT. Alternative tests include bone scan or thoracic MRI without and with IV contrast for the T12 osseous lesion, MRI abdomen without and with IV contrast for the liver findings, and diagnostic mammographic evaluation for the right axillary node. 5.  Aortic Atherosclerosis (ICD10-I70.0).    01/09/2018 PET scan   1. Moderate hypermetabolism corresponding to enlarging axillary node since 12/22/2017. Highly suspicious for an atypical distribution of metastatic disease. 2. No hypermetabolism to suggest hepatic or T12 osseous metastasis. 3. Hypermetabolic endometrial primary.   01/23/2018 Initial Diagnosis   Endometrial cancer ([place])   01/23/2018 Pathology Results   1. Lymph node, sentinel, biopsy, left external iliac - NO CARCINOMA IDENTIFIED  IN ONE LYMPH  NODE (0/1) - SEE COMMENT 2. Lymph nodes, regional resection, right para aortic - NO CARCINOMA IDENTIFIED IN FOUR LYMPH NODES (0/4) - SEE COMMENT 3. Lymph nodes, regional resection, right pelvic - NO CARCINOMA IDENTIFIED IN EIGHT LYMPH NODES (0/8) - SEE COMMENT 4. Cul-de-sac biopsy - METASTATIC CARCINOMA 5. Uterus +/- tubes/ovaries, neoplastic, cervix, bilateral fallopian tubes and ovaries UTERUS: - MIXED SEROUS AND ENDOMETRIOID CARCINOMA - SEROSAL IMPLANTS PRESENT - LYMPHOVASCULAR SPACE INVASION PRESENT - LEIOMYOMATA (1.5 CM; LARGEST) - SEE ONCOLOGY TABLE AND COMMENT BELOW CERVIX: - BENIGN NABOTHIAN CYSTS - NO CARCINOMA IDENTIFIED BILATERAL OVARIES: - METASTATIC CARCINOMA PRESENT ON OVARIAN SURFACE BILATERAL FALLOPIAN TUBES: - INTRALUMINAL CARCINOMA Microscopic Comment 1. -3. Cytokeratin AE1/3 was performed on the sentinel lymph nodes to exclude micrometastasis. There is no evidence of metastatic carcinoma by immunohistochemistry. 5. 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%) Histologic Grade: N/A Myometrial invasion: Estimated less than 50% myometrial invasion (0.3 cm of myometrium involved; 1.4 cm measured thickness) Uterine Serosa Involvement: Present Cervical stromal involvement: Not identified Extent of involvement of other organs: - Fallopian tube (left within the lumen) - Ovary, left (surface involvement) - Cul-de-sac Lymphovascular invasion: Present Regional Lymph Nodes: Examined: 1 Sentinel 12 Non-sentinel 13 Total Lymph nodes with metastasis: 0 Isolated tumor cells (< 0.2 mm): 0 Micrometastasis: (> 0.2 mm and < 2.0 mm): 0 Macrometastasis: (> 2.0 mm): 0 Extracapsular extension: N/A Tumor block for ancillary studies: 5E, 5B MMR /  MSI testing: Pending will be reported separately Pathologic Stage Classification (pTNM, AJCC 8th edition): pT3a, pN0 FIGO Stage: IIIA COMMENT: There is tumor present on the surface of the left ovary and within the lumen of the left fallopian tube. The carcinoma appears to be mixed with the largest component being high grade serous carcinoma. Dr. Lyndon Code reviewed the case and agrees with the above diagnosis.   01/23/2018 Surgery   Surgeon: Donaciano Eva   Operation: Robotic-assisted laparoscopic total hysterectomy with bilateral salpingoophorectomy, SLN injection, mapping and biopsy, right pelvic and para-aortic lymphadenectomy  Operative Findings:  : 10-12cm bulky uterus with frank serosal involvement on posterior cul de sac peritoneum and anterior peritoneum which adhesed the bladder to the anterior uterus. Unilateral mapping on left pelvis. No grossly suspicious nodes. Normal omentum and diaphragms.    02/01/2018 Pathology Results   Lymph node, needle/core biopsy, right axilla - METASTATIC CARCINOMA - SEE COMMENT Microscopic Comment The neoplastic cells are positive for cytokeratin 7 and Pax-8 but negative for cytokeratin 5/6, cytokeratin 20, Gata-3, p63, p53 and GCDFP. Overall, the immunoprofile is consistent with metastasis from the patient's known gynecologic carcinoma.   02/01/2018 Procedure   Ultrasound-guided core biopsies of a suspicious right axillary lymph node.   02/21/2018 - 06/13/2018 Chemotherapy   The patient had carboplatin & Taxol x 6   03/26/2018 - 04/30/2018 Radiation Therapy   Radiation treatment dates:   03/26/18, 04/02/18, 04/19/18, 04/23/18 04/30/18  Site/dose: proximal Vagina, 6 Gy in 5 fractions for a total dose of 30 Gy    04/26/2018 PET scan   1. Interval resection of the hypermetabolic endometrial primary. No discernible hypermetabolic pelvic sidewall or peritoneal metastases. 2. Stable hypermetabolic bilateral axillary lymphadenopathy. The degree of  hypermetabolism in these lymph nodes remains highly suspicious for neoplasm. 3. Interval development of low level FDG uptake in 2 stable, small lymph nodes in the left  groin region. This may be reactive, but close attention recommended to exclude metastatic involvement. 4. Stable non hypermetabolic low-density liver lesions and the mixed lucent and sclerotic T12 lesion is stable without hypermetabolism today.   07/09/2018 Imaging   Status post hysterectomy.  Mild axillary lymphadenopathy, improved. Nodal metastases not excluded.  Irregular wall thickening involving the anterior bladder, possibly reflecting radiation cystitis versus tumor.  Additional stable findings as above, including a 5 mm right middle lobe nodule and stable sclerosis involving the T12 vertebral body.   10/10/2018 Imaging   1. Stable exam. No findings within the abdomen or pelvis to suggest metastatic disease. 2. Unchanged appearance of sclerosis involving the T12 vertebra. 3.  Aortic Atherosclerosis (ICD10-I70.0).   10/10/2018 Imaging   Ct abdomen and pelvis 1. Stable exam. No findings within the abdomen or pelvis to suggest metastatic disease. 2. Unchanged appearance of sclerosis involving the T12 vertebra. 3.  Aortic Atherosclerosis (ICD10-I70.0).   01/16/2019 PET scan   1. Enlargement and increased activity in the left axillary, right axillary, and left inguinal adenopathy compatible with progressive malignancy. 2. Stable 4 mm right middle lobe subpleural nodule, not appreciably hypermetabolic. 3. Other imaging findings of potential clinical significance: Right kidney lower pole cyst. Aortic Atherosclerosis (ICD10-I70.0). Prominent stool throughout the colon favors constipation. Mild chronic left maxillary sinusitis.     02/01/2019 -  Chemotherapy   The patient had pembrolizumab (KEYTRUDA) 200 mg in sodium chloride 0.9 % 50 mL chemo infusion, 200 mg, Intravenous, Once, 8 of 10 cycles Administration: 200 mg  (02/01/2019), 200 mg (03/15/2019), 200 mg (04/05/2019), 200 mg (05/17/2019), 200 mg (02/22/2019), 200 mg (04/26/2019), 200 mg (06/10/2019), 200 mg (07/01/2019)  for chemotherapy treatment.    04/25/2019 Imaging   Ct imaging 1. Interval response to therapy. Decrease in size of bilateral axillary and left inguinal lymph nodes. No new or progressive findings identified. 2. Stable sclerotic appearance of the T12 vertebra. 3. Aortic atherosclerosis. Lad coronary artery calcification noted.     REVIEW OF SYSTEMS:   Constitutional: Denies fevers, chills or abnormal weight loss Eyes: Denies blurriness of vision Ears, nose, mouth, throat, and face: Denies mucositis or sore throat Respiratory: Denies cough, dyspnea or wheezes Cardiovascular: Denies palpitation, chest discomfort or lower extremity swelling Gastrointestinal:  Denies nausea, heartburn or change in bowel habits Skin: Denies abnormal skin rashes Lymphatics: Denies new lymphadenopathy or easy bruising Neurological:Denies numbness, tingling or new weaknesses Behavioral/Psych: Mood is stable, no new changes  All other systems were reviewed with the patient and are negative.  I have reviewed the past medical history, past surgical history, social history and family history with the patient and they are unchanged from previous note.  ALLERGIES:  is allergic to sulfa antibiotics and sulfamethoxazole.  MEDICATIONS:  Current Outpatient Medications  Medication Sig Dispense Refill  . Insulin Isophane & Regular Human (NOVOLIN 70/30 FLEXPEN) (70-30) 100 UNIT/ML PEN Inject 26 Units into the skin daily with breakfast. 10 pen 0  . ketoconazole (NIZORAL) 2 % cream Apply to web space twice a day for 1 week then daily for 1 month (Patient taking differently: Apply 1 application topically daily as needed for irritation. Apply to web space twice a day for 1 week then daily for 1 month) 15 g 0  . lenvatinib 10 mg daily dose (LENVIMA, 10 MG DAILY DOSE,) capsule  Take 1 capsule (10 mg total) by mouth daily. 30 capsule 11  . levothyroxine (SYNTHROID) 50 MCG tablet Take 1 tablet (50 mcg total) by mouth daily before  breakfast. 30 tablet 11  . lidocaine-prilocaine (EMLA) cream Apply to affected area once 30 g 3  . lisinopril-hydrochlorothiazide (PRINZIDE,ZESTORETIC) 20-12.5 MG tablet Take 1 tablet by mouth daily. (Patient taking differently: Take 1 tablet by mouth every morning. ) 30 tablet 11  . ondansetron (ZOFRAN) 8 MG tablet Take 1 tablet (8 mg total) by mouth daily. 30 tablet 1  . OneTouch Delica Lancets 44C MISC 1 each by Other route 2 (two) times daily. Use to monitor glucose levels BID; E11.65 100 each 2  . ONETOUCH VERIO test strip 1 each by Other route 3 (three) times daily. And lancets 3/day 300 each 3  . RELION PEN NEEDLES 32G X 4 MM MISC USE AS DIRECTED 50 each 1  . simvastatin (ZOCOR) 10 MG tablet Take 10 mg by mouth at bedtime.     . Vitamin D, Ergocalciferol, 2000 units CAPS Take 2,000 Units by mouth daily.   0   No current facility-administered medications for this visit.    PHYSICAL EXAMINATION: ECOG PERFORMANCE STATUS: 1 - Symptomatic but completely ambulatory  Vitals:   07/01/19 0851  BP: 117/77  Pulse: (!) 119  Resp: 18  Temp: 98.2 F (36.8 C)  SpO2: 100%   Filed Weights   07/01/19 0851  Weight: 197 lb 3.2 oz (89.4 kg)    GENERAL:alert, no distress and comfortable SKIN: skin color, texture, turgor are normal, no rashes or significant lesions EYES: normal, Conjunctiva are pink and non-injected, sclera clear OROPHARYNX:no exudate, no erythema and lips, buccal mucosa, and tongue normal  NECK: supple, thyroid normal size, non-tender, without nodularity LYMPH:  no palpable lymphadenopathy in the cervical, axillary or inguinal LUNGS: clear to auscultation and percussion with normal breathing effort HEART: regular rate & rhythm and no murmurs and no lower extremity edema ABDOMEN:abdomen soft, non-tender and normal bowel  sounds Musculoskeletal:no cyanosis of digits and no clubbing  NEURO: alert & oriented x 3 with fluent speech, no focal motor/sensory deficits  LABORATORY DATA:  I have reviewed the data as listed    Component Value Date/Time   NA 138 07/01/2019 0802   K 3.7 07/01/2019 0802   CL 105 07/01/2019 0802   CO2 22 07/01/2019 0802   GLUCOSE 217 (H) 07/01/2019 0802   BUN 28 (H) 07/01/2019 0802   CREATININE 1.61 (H) 07/01/2019 0802   CALCIUM 9.7 07/01/2019 0802   PROT 6.9 07/01/2019 0802   ALBUMIN 3.6 07/01/2019 0802   AST 15 07/01/2019 0802   ALT 9 07/01/2019 0802   ALKPHOS 72 07/01/2019 0802   BILITOT 0.6 07/01/2019 0802   GFRNONAA 33 (L) 07/01/2019 0802   GFRAA 38 (L) 07/01/2019 0802    No results found for: SPEP, UPEP  Lab Results  Component Value Date   WBC 3.9 (L) 07/01/2019   NEUTROABS 2.6 07/01/2019   HGB 11.6 (L) 07/01/2019   HCT 35.5 (L) 07/01/2019   MCV 88.3 07/01/2019   PLT 207 07/01/2019      Chemistry      Component Value Date/Time   NA 138 07/01/2019 0802   K 3.7 07/01/2019 0802   CL 105 07/01/2019 0802   CO2 22 07/01/2019 0802   BUN 28 (H) 07/01/2019 0802   CREATININE 1.61 (H) 07/01/2019 0802      Component Value Date/Time   CALCIUM 9.7 07/01/2019 0802   ALKPHOS 72 07/01/2019 0802   AST 15 07/01/2019 0802   ALT 9 07/01/2019 0802   BILITOT 0.6 07/01/2019 0802

## 2019-07-02 NOTE — Assessment & Plan Note (Signed)
So far, she tolerated treatment very well without major side effects Continue similar treatment indefinitely I plan to space out her next future imaging to 4 to 6 months, due maybe around May 2021 the earliest

## 2019-07-02 NOTE — Assessment & Plan Note (Signed)
I noted that she has intermittently high blood sugar We discussed the importance of getting her blood sugar under control while on treatment

## 2019-07-04 ENCOUNTER — Encounter: Payer: Self-pay | Admitting: Podiatry

## 2019-07-04 ENCOUNTER — Telehealth: Payer: Self-pay

## 2019-07-04 NOTE — Telephone Encounter (Signed)
She called and left a message to call her.  Called back. She needing next appt date/time, given appts date/ times. She is questioning if she needs to see Dr. Alvy Bimler on 4/5 and 4/26? She thought that she would see Dr. Alvy Bimler on 4/5.

## 2019-07-04 NOTE — Telephone Encounter (Signed)
Keep it for now I can cancel if  not needed

## 2019-07-04 NOTE — Progress Notes (Addendum)
Subjective: Krista Johnson presents today for follow up of painful mycotic nails b/l that are difficult to trim. Pain interferes with ambulation. Aggravating factors include wearing enclosed shoe gear. Pain is relieved with periodic professional debridement.   She complains of left heel pain. Pain is located posterior heel. She relates it's tender to touch. She sleeps on her back at all times.  Allergies  Allergen Reactions  . Sulfa Antibiotics Nausea Only  . Sulfamethoxazole Nausea Only    Objective: There were no vitals filed for this visit.  Pt 69 y.o. year old AA  female  in NAD. AAO x 3.   Vascular Examination:  Capillary refill time to digits immediate b/l. Palpable DP pulses b/l. Palpable PT pulses b/l. Pedal hair sparse b/l. Skin temperature gradient within normal limits b/l.  Dermatological Examination: Pedal skin with normal turgor, texture and tone bilaterally. No open wounds bilaterally. No interdigital macerations bilaterally. Toenails 1-5 b/l elongated, dystrophic, thickened, crumbly with subungual debris and tenderness to dorsal palpation.  Musculoskeletal: Normal muscle strength 5/5 to all lower extremity muscle groups bilaterally, no gross bony deformities bilaterally and no pain crepitus or joint limitation noted with ROM b/l.  +Tenderness to posterior heel.   Neurological: Protective sensation intact 5/5 intact bilaterally with 10g monofilament b/l Vibratory sensation intact b/l  Assessment: 1. Pain due to onychomycosis of toenails of both feet   2. 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 Pain of left heel    Plan: -Continue diabetic foot care principles. Literature dispensed on today.  -Toenails 1-5 b/l were debrided in length and girth with sterile nail nippers and dremel without iatrogenic bleeding.  -Advised patient to float left heel when in bed. Educated her on offloading. -Patient to continue soft, supportive shoe gear daily. -Patient to report any  pedal injuries to medical professional immediately. -Patient/POA to call should there be question/concern in the interim.  Return in about 3 months (around 09/28/2019) for diabetic nail trim.

## 2019-07-16 NOTE — Progress Notes (Signed)
Pharmacist Chemotherapy Monitoring - Follow Up Assessment    I verify that I have reviewed each item in the below checklist:  . Regimen for the patient is scheduled for the appropriate day and plan matches scheduled date. Marland Kitchen Appropriate non-routine labs are ordered dependent on drug ordered. . If applicable, additional medications reviewed and ordered per protocol based on lifetime cumulative doses and/or treatment regimen.   Plan for follow-up and/or issues identified: No . I-vent associated with next due treatment: No    Krista Johnson, Pharm.D., CPP 07/16/2019@3 :00 PM

## 2019-07-22 ENCOUNTER — Other Ambulatory Visit: Payer: Self-pay

## 2019-07-22 ENCOUNTER — Inpatient Hospital Stay: Payer: Medicare Other

## 2019-07-22 ENCOUNTER — Inpatient Hospital Stay (HOSPITAL_BASED_OUTPATIENT_CLINIC_OR_DEPARTMENT_OTHER): Payer: Medicare Other | Admitting: Hematology and Oncology

## 2019-07-22 ENCOUNTER — Encounter: Payer: Self-pay | Admitting: Hematology and Oncology

## 2019-07-22 ENCOUNTER — Inpatient Hospital Stay: Payer: Medicare Other | Attending: Gynecology

## 2019-07-22 DIAGNOSIS — C541 Malignant neoplasm of endometrium: Secondary | ICD-10-CM | POA: Diagnosis not present

## 2019-07-22 DIAGNOSIS — E039 Hypothyroidism, unspecified: Secondary | ICD-10-CM | POA: Diagnosis not present

## 2019-07-22 DIAGNOSIS — D638 Anemia in other chronic diseases classified elsewhere: Secondary | ICD-10-CM | POA: Diagnosis not present

## 2019-07-22 DIAGNOSIS — Z79899 Other long term (current) drug therapy: Secondary | ICD-10-CM | POA: Insufficient documentation

## 2019-07-22 DIAGNOSIS — C773 Secondary and unspecified malignant neoplasm of axilla and upper limb lymph nodes: Secondary | ICD-10-CM

## 2019-07-22 DIAGNOSIS — I700 Atherosclerosis of aorta: Secondary | ICD-10-CM | POA: Diagnosis not present

## 2019-07-22 DIAGNOSIS — Z9221 Personal history of antineoplastic chemotherapy: Secondary | ICD-10-CM | POA: Insufficient documentation

## 2019-07-22 DIAGNOSIS — D631 Anemia in chronic kidney disease: Secondary | ICD-10-CM | POA: Insufficient documentation

## 2019-07-22 DIAGNOSIS — Z794 Long term (current) use of insulin: Secondary | ICD-10-CM | POA: Diagnosis not present

## 2019-07-22 DIAGNOSIS — Z5112 Encounter for antineoplastic immunotherapy: Secondary | ICD-10-CM | POA: Diagnosis not present

## 2019-07-22 DIAGNOSIS — E1122 Type 2 diabetes mellitus with diabetic chronic kidney disease: Secondary | ICD-10-CM | POA: Insufficient documentation

## 2019-07-22 DIAGNOSIS — Z7189 Other specified counseling: Secondary | ICD-10-CM

## 2019-07-22 DIAGNOSIS — Z923 Personal history of irradiation: Secondary | ICD-10-CM | POA: Insufficient documentation

## 2019-07-22 DIAGNOSIS — N183 Chronic kidney disease, stage 3 unspecified: Secondary | ICD-10-CM | POA: Insufficient documentation

## 2019-07-22 LAB — CMP (CANCER CENTER ONLY)
ALT: 17 U/L (ref 0–44)
AST: 13 U/L — ABNORMAL LOW (ref 15–41)
Albumin: 3.6 g/dL (ref 3.5–5.0)
Alkaline Phosphatase: 66 U/L (ref 38–126)
Anion gap: 13 (ref 5–15)
BUN: 28 mg/dL — ABNORMAL HIGH (ref 8–23)
CO2: 21 mmol/L — ABNORMAL LOW (ref 22–32)
Calcium: 9.7 mg/dL (ref 8.9–10.3)
Chloride: 109 mmol/L (ref 98–111)
Creatinine: 1.45 mg/dL — ABNORMAL HIGH (ref 0.44–1.00)
GFR, Est AFR Am: 43 mL/min — ABNORMAL LOW (ref >60)
GFR, Estimated: 37 mL/min — ABNORMAL LOW (ref >60)
Glucose, Bld: 137 mg/dL — ABNORMAL HIGH (ref 70–99)
Potassium: 3.7 mmol/L (ref 3.5–5.1)
Sodium: 143 mmol/L (ref 135–145)
Total Bilirubin: 0.4 mg/dL (ref 0.3–1.2)
Total Protein: 6.8 g/dL (ref 6.5–8.1)

## 2019-07-22 LAB — CBC WITH DIFFERENTIAL (CANCER CENTER ONLY)
Abs Immature Granulocytes: 0.01 10*3/uL (ref 0.00–0.07)
Basophils Absolute: 0 10*3/uL (ref 0.0–0.1)
Basophils Relative: 0 %
Eosinophils Absolute: 0.1 10*3/uL (ref 0.0–0.5)
Eosinophils Relative: 2 %
HCT: 34.5 % — ABNORMAL LOW (ref 36.0–46.0)
Hemoglobin: 11.2 g/dL — ABNORMAL LOW (ref 12.0–15.0)
Immature Granulocytes: 0 %
Lymphocytes Relative: 32 %
Lymphs Abs: 1.5 10*3/uL (ref 0.7–4.0)
MCH: 29.2 pg (ref 26.0–34.0)
MCHC: 32.5 g/dL (ref 30.0–36.0)
MCV: 89.8 fL (ref 80.0–100.0)
Monocytes Absolute: 0.3 10*3/uL (ref 0.1–1.0)
Monocytes Relative: 5 %
Neutro Abs: 2.9 10*3/uL (ref 1.7–7.7)
Neutrophils Relative %: 61 %
Platelet Count: 200 10*3/uL (ref 150–400)
RBC: 3.84 MIL/uL — ABNORMAL LOW (ref 3.87–5.11)
RDW: 14.4 % (ref 11.5–15.5)
WBC Count: 4.8 10*3/uL (ref 4.0–10.5)
nRBC: 0 % (ref 0.0–0.2)

## 2019-07-22 LAB — TSH: TSH: 2.646 u[IU]/mL (ref 0.308–3.960)

## 2019-07-22 MED ORDER — HEPARIN SOD (PORK) LOCK FLUSH 100 UNIT/ML IV SOLN
500.0000 [IU] | Freq: Once | INTRAVENOUS | Status: AC | PRN
  Administered 2019-07-22: 500 [IU]
  Filled 2019-07-22: qty 5

## 2019-07-22 MED ORDER — SODIUM CHLORIDE 0.9 % IV SOLN
Freq: Once | INTRAVENOUS | Status: AC
  Filled 2019-07-22: qty 250

## 2019-07-22 MED ORDER — SODIUM CHLORIDE 0.9 % IV SOLN
200.0000 mg | Freq: Once | INTRAVENOUS | Status: AC
  Administered 2019-07-22: 200 mg via INTRAVENOUS
  Filled 2019-07-22: qty 8

## 2019-07-22 MED ORDER — SODIUM CHLORIDE 0.9% FLUSH
10.0000 mL | INTRAVENOUS | Status: DC | PRN
  Administered 2019-07-22: 10 mL
  Filled 2019-07-22: qty 10

## 2019-07-22 MED ORDER — SODIUM CHLORIDE 0.9% FLUSH
10.0000 mL | Freq: Once | INTRAVENOUS | Status: AC
  Administered 2019-07-22: 10 mL
  Filled 2019-07-22: qty 10

## 2019-07-22 NOTE — Assessment & Plan Note (Signed)
So far, she tolerated treatment very well without major side effects Continue similar treatment indefinitely I plan to space out her next future imaging, due maybe around May 2021 the earliest

## 2019-07-22 NOTE — Progress Notes (Signed)
[place] OFFICE PROGRESS NOTE  Patient Care Team: Jonathon Jordan, MD as PCP - General (Family Medicine)  ASSESSMENT & PLAN:  Endometrial cancer [facility]) So far, she tolerated treatment very well without major side effects Continue similar treatment indefinitely I plan to space out her next future imaging, due maybe around May 2021 the earliest  Anemia, chronic disease This is likely multifactorial related to anemia chronic disease It is not likely due to her treatment She is not symptomatic.  Observe only.    Chronic kidney disease (CKD), stage III (moderate) (HCC) She has stable chronic kidney disease We will monitor carefully   No orders of the defined types were placed in this encounter.   All questions were answered. The patient knows to call the clinic with any problems, questions or concerns. The total time spent in the appointment was 20 minutes encounter with patients including review of chart and various tests results, discussions about plan of care and coordination of care plan   [name], MD 07/22/2019 12:00 PM  INTERVAL HISTORY: Please see below for problem oriented charting. 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 Leo Grosser who obtained an endometrial biopsy showing poorly differentiated endometrial carcinoma and negative endocervical curettage   12/12/2017 Imaging   MAMMOGRAM FINDINGS: In  the right axilla, a possible mass warrants further evaluation. In the left breast, no findings suspicious for malignancy.  Images were processed with CAD.  IMPRESSION: Further evaluation is suggested for possible mass in the right axilla.    12/24/2017 Imaging   Ct scan chest, abdomen and pelvis 1. Marked thickening of the endometrium (42 mm) compatible with known primary endometrial malignancy. No evidence of extrauterine invasion. 2. No pelvic or retroperitoneal adenopathy. 3. Right middle lobe 4 mm solid pulmonary nodule, for which follow-up chest CT is advised in 3-6 months. 4. Several findings that are equivocal for distant metastatic disease. Vaguely nodular heterogeneous hyperenhancement in the peripheral right liver lobe, which could represent benign transient perfusional phenomena, with underlying liver lesions not entirely excluded. Mildly sclerotic T12 vertebral lesion. Mildly enlarged right axillary lymph node. The best single test to further evaluate these findings would be a PET-CT. Alternative tests include bone scan or thoracic MRI without and with IV contrast for the T12 osseous lesion, MRI abdomen without and with IV contrast for the liver findings, and diagnostic mammographic evaluation for the right axillary node. 5.  Aortic Atherosclerosis (ICD10-I70.0).    01/09/2018 PET scan   1. Moderate hypermetabolism corresponding to enlarging axillary node since 12/22/2017. Highly suspicious for an atypical distribution of metastatic disease. 2. No hypermetabolism to suggest hepatic or T12 osseous metastasis. 3. Hypermetabolic endometrial primary.   01/23/2018 Initial Diagnosis   Endometrial cancer ([place])   01/23/2018 Pathology Results   1. Lymph node, sentinel, biopsy, left external iliac - NO CARCINOMA IDENTIFIED IN ONE LYMPH NODE (0/1) - SEE COMMENT 2. Lymph nodes, regional resection, right para aortic - NO CARCINOMA IDENTIFIED IN FOUR LYMPH NODES (0/4) -  SEE COMMENT 3.  Lymph nodes, regional resection, right pelvic - NO CARCINOMA IDENTIFIED IN EIGHT LYMPH NODES (0/8) - SEE COMMENT 4. Cul-de-sac biopsy - METASTATIC CARCINOMA 5. Uterus +/- tubes/ovaries, neoplastic, cervix, bilateral fallopian tubes and ovaries UTERUS: - MIXED SEROUS AND ENDOMETRIOID CARCINOMA - SEROSAL IMPLANTS PRESENT - LYMPHOVASCULAR SPACE INVASION PRESENT - LEIOMYOMATA (1.5 CM; LARGEST) - SEE ONCOLOGY TABLE AND COMMENT BELOW CERVIX: - BENIGN NABOTHIAN CYSTS - NO CARCINOMA IDENTIFIED BILATERAL OVARIES: - METASTATIC CARCINOMA PRESENT ON OVARIAN SURFACE BILATERAL FALLOPIAN TUBES: - INTRALUMINAL CARCINOMA Microscopic Comment 1. -3. Cytokeratin AE1/3 was performed on the sentinel lymph nodes to exclude micrometastasis. There is no evidence of metastatic carcinoma by immunohistochemistry. 5. 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%) Histologic Grade: N/A Myometrial invasion: Estimated less than 50% myometrial invasion (0.3 cm of myometrium involved; 1.4 cm measured thickness) Uterine Serosa Involvement: Present Cervical stromal involvement: Not identified Extent of involvement of other organs: - Fallopian tube (left within the lumen) - Ovary, left (surface involvement) - Cul-de-sac Lymphovascular invasion: Present Regional Lymph Nodes: Examined: 1 Sentinel 12 Non-sentinel 13 Total Lymph nodes with metastasis: 0 Isolated tumor cells (< 0.2 mm): 0 Micrometastasis: (> 0.2 mm and < 2.0 mm): 0 Macrometastasis: (> 2.0 mm): 0 Extracapsular extension: N/A Tumor block for ancillary studies: 5E, 5B MMR / MSI testing: Pending will be reported separately Pathologic Stage Classification (pTNM, AJCC 8th edition): pT3a, pN0 FIGO Stage: IIIA COMMENT: There  is tumor present on the surface of the left ovary and within the lumen of the left fallopian tube. The carcinoma appears to be mixed with the largest component being high grade serous carcinoma. Dr. Lyndon Code reviewed the case and agrees with the above diagnosis.   01/23/2018 Surgery   Surgeon: Donaciano Eva   Operation: Robotic-assisted laparoscopic total hysterectomy with bilateral salpingoophorectomy, SLN injection, mapping and biopsy, right pelvic and para-aortic lymphadenectomy  Operative Findings:  : 10-12cm bulky uterus with frank serosal involvement on posterior cul de sac peritoneum and anterior peritoneum which adhesed the bladder to the anterior uterus. Unilateral mapping on left pelvis. No grossly suspicious nodes. Normal omentum and diaphragms.    02/01/2018 Pathology Results   Lymph node, needle/core biopsy, right axilla - METASTATIC CARCINOMA - SEE COMMENT Microscopic Comment The neoplastic cells are positive for cytokeratin 7 and Pax-8 but negative for cytokeratin 5/6, cytokeratin 20, Gata-3, p63, p53 and GCDFP. Overall, the immunoprofile is consistent with metastasis from the patient's known gynecologic carcinoma.   02/01/2018 Procedure   Ultrasound-guided core biopsies of a suspicious right axillary lymph node.   02/21/2018 - 06/13/2018 Chemotherapy   The patient had carboplatin & Taxol x 6   03/26/2018 - 04/30/2018 Radiation Therapy   Radiation treatment dates:   03/26/18, 04/02/18, 04/19/18, 04/23/18 04/30/18  Site/dose: proximal Vagina, 6 Gy in 5 fractions for a total dose of 30 Gy    04/26/2018 PET scan   1. Interval resection of the hypermetabolic endometrial primary. No discernible hypermetabolic pelvic sidewall or peritoneal metastases. 2. Stable hypermetabolic bilateral axillary lymphadenopathy. The degree of hypermetabolism in these lymph nodes remains highly suspicious for neoplasm. 3. Interval development of low level FDG uptake in 2 stable, small lymph nodes in  the left groin region. This may be reactive, but close attention recommended to exclude metastatic involvement. 4. Stable non hypermetabolic low-density liver lesions and the mixed lucent and  sclerotic T12 lesion is stable without hypermetabolism today.   07/09/2018 Imaging   Status post hysterectomy.  Mild axillary lymphadenopathy, improved. Nodal metastases not excluded.  Irregular wall thickening involving the anterior bladder, possibly reflecting radiation cystitis versus tumor.  Additional stable findings as above, including a 5 mm right middle lobe nodule and stable sclerosis involving the T12 vertebral body.   10/10/2018 Imaging   1. Stable exam. No findings within the abdomen or pelvis to suggest metastatic disease. 2. Unchanged appearance of sclerosis involving the T12 vertebra. 3.  Aortic Atherosclerosis (ICD10-I70.0).   10/10/2018 Imaging   Ct abdomen and pelvis 1. Stable exam. No findings within the abdomen or pelvis to suggest metastatic disease. 2. Unchanged appearance of sclerosis involving the T12 vertebra. 3.  Aortic Atherosclerosis (ICD10-I70.0).   01/16/2019 PET scan   1. Enlargement and increased activity in the left axillary, right axillary, and left inguinal adenopathy compatible with progressive malignancy. 2. Stable 4 mm right middle lobe subpleural nodule, not appreciably hypermetabolic. 3. Other imaging findings of potential clinical significance: Right kidney lower pole cyst. Aortic Atherosclerosis (ICD10-I70.0). Prominent stool throughout the colon favors constipation. Mild chronic left maxillary sinusitis.     02/01/2019 -  Chemotherapy   The patient had pembrolizumab (KEYTRUDA) 200 mg in sodium chloride 0.9 % 50 mL chemo infusion, 200 mg, Intravenous, Once, 9 of 10 cycles Administration: 200 mg (02/01/2019), 200 mg (03/15/2019), 200 mg (04/05/2019), 200 mg (05/17/2019), 200 mg (02/22/2019), 200 mg (04/26/2019), 200 mg (06/10/2019), 200 mg (07/01/2019)  for  chemotherapy treatment.    04/25/2019 Imaging   Ct imaging 1. Interval response to therapy. Decrease in size of bilateral axillary and left inguinal lymph nodes. No new or progressive findings identified. 2. Stable sclerotic appearance of the T12 vertebra. 3. Aortic atherosclerosis. Lad coronary artery calcification noted.     REVIEW OF SYSTEMS:   Constitutional: Denies fevers, chills or abnormal weight loss Eyes: Denies blurriness of vision Ears, nose, mouth, throat, and face: Denies mucositis or sore throat Respiratory: Denies cough, dyspnea or wheezes Cardiovascular: Denies palpitation, chest discomfort or lower extremity swelling Gastrointestinal:  Denies nausea, heartburn or change in bowel habits Skin: Denies abnormal skin rashes Lymphatics: Denies new lymphadenopathy or easy bruising Neurological:Denies numbness, tingling or new weaknesses Behavioral/Psych: Mood is stable, no new changes  All other systems were reviewed with the patient and are negative.  I have reviewed the past medical history, past surgical history, social history and family history with the patient and they are unchanged from previous note.  ALLERGIES:  is allergic to sulfa antibiotics and sulfamethoxazole.  MEDICATIONS:  Current Outpatient Medications  Medication Sig Dispense Refill  . Insulin Isophane & Regular Human (NOVOLIN 70/30 FLEXPEN) (70-30) 100 UNIT/ML PEN Inject 26 Units into the skin daily with breakfast. 10 pen 0  . ketoconazole (NIZORAL) 2 % cream Apply to web space twice a day for 1 week then daily for 1 month (Patient taking differently: Apply 1 application topically daily as needed for irritation. Apply to web space twice a day for 1 week then daily for 1 month) 15 g 0  . lenvatinib 10 mg daily dose (LENVIMA, 10 MG DAILY DOSE,) capsule Take 1 capsule (10 mg total) by mouth daily. 30 capsule 11  . levothyroxine (SYNTHROID) 50 MCG tablet Take 1 tablet (50 mcg total) by mouth daily before  breakfast. 30 tablet 11  . lidocaine-prilocaine (EMLA) cream Apply to affected area once 30 g 3  . lisinopril-hydrochlorothiazide (PRINZIDE,ZESTORETIC) 20-12.5 MG tablet Take 1  tablet by mouth daily. (Patient taking differently: Take 1 tablet by mouth every morning. ) 30 tablet 11  . ondansetron (ZOFRAN) 8 MG tablet Take 1 tablet (8 mg total) by mouth daily. 30 tablet 1  . OneTouch Delica Lancets 54U MISC 1 each by Other route 2 (two) times daily. Use to monitor glucose levels BID; E11.65 100 each 2  . ONETOUCH VERIO test strip 1 each by Other route 3 (three) times daily. And lancets 3/day 300 each 3  . RELION PEN NEEDLES 32G X 4 MM MISC USE AS DIRECTED 50 each 1  . simvastatin (ZOCOR) 10 MG tablet Take 10 mg by mouth at bedtime.     . Vitamin D, Ergocalciferol, 2000 units CAPS Take 2,000 Units by mouth daily.   0   No current facility-administered medications for this visit.   Facility-Administered Medications Ordered in Other Visits  Medication Dose Route Frequency Provider Last Rate Last Admin  . heparin lock flush 100 unit/mL  500 Units Intracatheter Once PRN Alvy Bimler, Jospeh Mangel, MD      . pembrolizumab (KEYTRUDA) 200 mg in sodium chloride 0.9 % 50 mL chemo infusion  200 mg Intravenous Once Caelie Remsburg, MD      . sodium chloride flush (NS) 0.9 % injection 10 mL  10 mL Intracatheter PRN Alvy Bimler, Damontay Alred, MD        PHYSICAL EXAMINATION: ECOG PERFORMANCE STATUS: 1 - Symptomatic but completely ambulatory  Vitals:   07/22/19 1052  BP: 119/85  Pulse: 93  Resp: 18  Temp: 98.3 F (36.8 C)  SpO2: 100%   Filed Weights   07/22/19 1052  Weight: 195 lb 8 oz (88.7 kg)    GENERAL:alert, no distress and comfortable SKIN: skin color, texture, turgor are normal, no rashes or significant lesions EYES: normal, Conjunctiva are pink and non-injected, sclera clear OROPHARYNX:no exudate, no erythema and lips, buccal mucosa, and tongue normal  NECK: supple, thyroid normal size, non-tender, without  nodularity LYMPH:  no palpable lymphadenopathy in the cervical, axillary or inguinal LUNGS: clear to auscultation and percussion with normal breathing effort HEART: regular rate & rhythm and no murmurs and no lower extremity edema ABDOMEN:abdomen soft, non-tender and normal bowel sounds Musculoskeletal:no cyanosis of digits and no clubbing  NEURO: alert & oriented x 3 with fluent speech, no focal motor/sensory deficits  LABORATORY DATA:  I have reviewed the data as listed    Component Value Date/Time   NA 143 07/22/2019 1032   K 3.7 07/22/2019 1032   CL 109 07/22/2019 1032   CO2 21 (L) 07/22/2019 1032   GLUCOSE 137 (H) 07/22/2019 1032   BUN 28 (H) 07/22/2019 1032   CREATININE 1.45 (H) 07/22/2019 1032   CALCIUM 9.7 07/22/2019 1032   PROT 6.8 07/22/2019 1032   ALBUMIN 3.6 07/22/2019 1032   AST 13 (L) 07/22/2019 1032   ALT 17 07/22/2019 1032   ALKPHOS 66 07/22/2019 1032   BILITOT 0.4 07/22/2019 1032   GFRNONAA 37 (L) 07/22/2019 1032   GFRAA 43 (L) 07/22/2019 1032    No results found for: SPEP, UPEP  Lab Results  Component Value Date   WBC 4.8 07/22/2019   NEUTROABS 2.9 07/22/2019   HGB 11.2 (L) 07/22/2019   HCT 34.5 (L) 07/22/2019   MCV 89.8 07/22/2019   PLT 200 07/22/2019      Chemistry      Component Value Date/Time   NA 143 07/22/2019 1032   K 3.7 07/22/2019 1032   CL 109 07/22/2019 1032   CO2  21 (L) 07/22/2019 1032   BUN 28 (H) 07/22/2019 1032   CREATININE 1.45 (H) 07/22/2019 1032      Component Value Date/Time   CALCIUM 9.7 07/22/2019 1032   ALKPHOS 66 07/22/2019 1032   AST 13 (L) 07/22/2019 1032   ALT 17 07/22/2019 1032   BILITOT 0.4 07/22/2019 1032

## 2019-07-22 NOTE — Assessment & Plan Note (Signed)
She has stable chronic kidney disease We will monitor carefully

## 2019-07-22 NOTE — Assessment & Plan Note (Signed)
This is likely multifactorial related to anemia chronic disease It is not likely due to her treatment She is not symptomatic.  Observe only.   

## 2019-07-22 NOTE — Patient Instructions (Signed)
[facility] Discharge Instructions for Patients Receiving Chemotherapy  Today you received the following chemotherapy agent: pembrolizumab.   To help prevent nausea and vomiting after your treatment, we encourage you to take your nausea medication as directed.    If you develop nausea and vomiting that is not controlled by your nausea medication, call the clinic.   BELOW ARE SYMPTOMS THAT SHOULD BE REPORTED IMMEDIATELY:  *FEVER GREATER THAN 100.5 F  *CHILLS WITH OR WITHOUT FEVER  NAUSEA AND VOMITING THAT IS NOT CONTROLLED WITH YOUR NAUSEA MEDICATION  *UNUSUAL SHORTNESS OF BREATH  *UNUSUAL BRUISING OR BLEEDING  TENDERNESS IN MOUTH AND THROAT WITH OR WITHOUT PRESENCE OF ULCERS  *URINARY PROBLEMS  *BOWEL PROBLEMS  UNUSUAL RASH Items with * indicate a potential emergency and should be followed up as soon as possible.  Feel free to call the clinic should you have any questions or concerns. The clinic phone number is [phone].  Please show the CHEMO ALERT CARD at check-in to the Emergency Department and triage nurse.   

## 2019-07-23 LAB — T4: T4, Total: 7.3 ug/dL (ref 4.5–12.0)

## 2019-07-25 ENCOUNTER — Other Ambulatory Visit: Payer: Self-pay

## 2019-07-29 ENCOUNTER — Encounter: Payer: Self-pay | Admitting: Endocrinology

## 2019-07-29 ENCOUNTER — Ambulatory Visit (INDEPENDENT_AMBULATORY_CARE_PROVIDER_SITE_OTHER): Payer: Medicare Other | Admitting: Endocrinology

## 2019-07-29 ENCOUNTER — Other Ambulatory Visit: Payer: Self-pay

## 2019-07-29 VITALS — BP 130/80 | HR 104 | Ht 66.0 in | Wt 195.0 lb

## 2019-07-29 DIAGNOSIS — N1831 Chronic kidney disease, stage 3a: Secondary | ICD-10-CM | POA: Diagnosis not present

## 2019-07-29 DIAGNOSIS — E1121 Type 2 diabetes mellitus with diabetic nephropathy: Secondary | ICD-10-CM | POA: Diagnosis not present

## 2019-07-29 DIAGNOSIS — Z794 Long term (current) use of insulin: Secondary | ICD-10-CM

## 2019-07-29 DIAGNOSIS — E213 Hyperparathyroidism, unspecified: Secondary | ICD-10-CM

## 2019-07-29 LAB — POCT GLYCOSYLATED HEMOGLOBIN (HGB A1C): Hemoglobin A1C: 6.2 % — AB (ref 4.0–5.6)

## 2019-07-29 LAB — VITAMIN D 25 HYDROXY (VIT D DEFICIENCY, FRACTURES): VITD: 36.47 ng/mL (ref 30.00–100.00)

## 2019-07-29 MED ORDER — NOVOLIN 70/30 FLEXPEN (70-30) 100 UNIT/ML ~~LOC~~ SUPN: 22.0000 [IU] | PEN_INJECTOR | Freq: Every day | SUBCUTANEOUS | 0 refills | Status: DC

## 2019-07-29 NOTE — Progress Notes (Signed)
Subjective:    Patient ID: Krista Johnson, female    DOB: [birth year], 69 y.o.   MRN: [id number]  HPI Pt returns for f/u of diabetes mellitus:  DM type: Insulin-requiring type 2.   Dx'ed: [id number] Complications: renal insuff Therapy: insulin since mid-2019 GDM: never (G0) DKA: never Severe hypoglycemia: never Pancreatitis: never Pancreatic imaging: normal on 2019 CT.   Other: she takes qd insulin, after poor results with multiple daily injections; She is finished with her chemo; she takes NPH qam, due to the pattern of her cbg's Interval history:she brings her meter with her cbg's which I have reviewed today.  cbg varies from 58-131.  There is no trend throughout the day. Pt says she seldom misses the insulin. She takes 30 units qam.   Pt also has vit-D def and Hyperparathyroidism (prob a combination of primary and secondary).  She takes vit-D, 2000 units/day.  No recent steroids.   Past Medical History:  Diagnosis Date  . Arthritis    right knee--- last cortisone injection 04/ 2019  . CKD (chronic kidney disease)   . Diabetes mellitus without complication [facility])    endocrinologist-  dr Loanne Drilling  . Endometrial cancer ([place])   . History of colon polyps   . Hyperlipidemia   . Hyperparathyroidism ([place])   . Hypertension     Past Surgical History:  Procedure Laterality Date  . COLONOSCOPY  last one 12-25-2017  . IR IMAGING GUIDED PORT INSERTION  02/20/2018  . ROBOTIC ASSISTED TOTAL HYSTERECTOMY WITH BILATERAL SALPINGO OOPHERECTOMY N/A 01/23/2018   Procedure: XI ROBOTIC ASSISTED TOTAL HYSTERECTOMY WITH BILATERAL SALPINGO OOPHORECTOMY;  Surgeon: Everitt Amber, MD;  Location: [facility] ORS;  Service: Gynecology;  Laterality: N/A;  . SENTINEL NODE BIOPSY N/A 01/23/2018   Procedure: SENTINEL NODE BIOPSY;  Surgeon: Everitt Amber, MD;  Location: [facility] ORS;  Service: Gynecology;  Laterality: N/A;    Social History   Socioeconomic History  . Marital status: Single    Spouse name: Not on file  . Number of  children: 0  . Years of education: Not on file  . Highest education level: Not on file  Occupational History  . Occupation: retired Education officer, museum  Tobacco Use  . Smoking status: Never Smoker  . Smokeless tobacco: Never Used  Substance and Sexual Activity  . Alcohol use: Not Currently    Alcohol/week: 0.0 standard drinks  . Drug use: Never  . Sexual activity: Not on file  Other Topics Concern  . Not on file  Social History Narrative  . Not on file   Social Determinants of Health   Financial Resource Strain:   . Difficulty of Paying Living Expenses:   Food Insecurity:   . Worried About Charity fundraiser in the Last Year:   . Arboriculturist in the Last Year:   Transportation Needs:   . Film/video editor (Medical):   Marland Kitchen Lack of Transportation (Non-Medical):   Physical Activity:   . Days of Exercise per Week:   . Minutes of Exercise per Session:   Stress:   . Feeling of Stress :   Social Connections:   . Frequency of Communication with Friends and Family:   . Frequency of Social Gatherings with Friends and Family:   . Attends Religious Services:   . Active Member of Clubs or Organizations:   . Attends Archivist Meetings:   Marland Kitchen Marital Status:   Intimate Partner Violence:   . Fear of Current or Ex-Partner:   .  Emotionally Abused:   Marland Kitchen Physically Abused:   . Sexually Abused:     Current Outpatient Medications on File Prior to Visit  Medication Sig Dispense Refill  . ketoconazole (NIZORAL) 2 % cream Apply to web space twice a day for 1 week then daily for 1 month (Patient taking differently: Apply 1 application topically daily as needed for irritation. Apply to web space twice a day for 1 week then daily for 1 month) 15 g 0  . lenvatinib 10 mg daily dose (LENVIMA, 10 MG DAILY DOSE,) capsule Take 1 capsule (10 mg total) by mouth daily. 30 capsule 11  . levothyroxine (SYNTHROID) 50 MCG tablet Take 1 tablet (50 mcg total) by mouth daily before breakfast. 30  tablet 11  . lidocaine-prilocaine (EMLA) cream Apply to affected area once 30 g 3  . lisinopril-hydrochlorothiazide (PRINZIDE,ZESTORETIC) 20-12.5 MG tablet Take 1 tablet by mouth daily. (Patient taking differently: Take 1 tablet by mouth every morning. ) 30 tablet 11  . ondansetron (ZOFRAN) 8 MG tablet Take 1 tablet (8 mg total) by mouth daily. 30 tablet 1  . OneTouch Delica Lancets 99991111 MISC 1 each by Other route 2 (two) times daily. Use to monitor glucose levels BID; E11.65 100 each 2  . ONETOUCH VERIO test strip 1 each by Other route 3 (three) times daily. And lancets 3/day 300 each 3  . RELION PEN NEEDLES 32G X 4 MM MISC USE AS DIRECTED 50 each 1  . simvastatin (ZOCOR) 10 MG tablet Take 10 mg by mouth at bedtime.     . Vitamin D, Ergocalciferol, 2000 units CAPS Take 2,000 Units by mouth daily.   0   No current facility-administered medications on file prior to visit.    Allergies  Allergen Reactions  . Sulfa Antibiotics Nausea Only  . Sulfamethoxazole Nausea Only    Family History  Problem Relation Age of Onset  . Diabetes Mother   . Hypertension Mother   . Diabetes Sister   . Hypertension Sister   . Diabetes Maternal Uncle   . Colon cancer Paternal Aunt   . Diabetes Paternal Aunt   . Stomach cancer Neg Hx   . Rectal cancer Neg Hx   . Esophageal cancer Neg Hx   . Colon polyps Neg Hx     BP 130/80 (BP Location: Left Arm, Patient Position: Sitting, Cuff Size: Normal)   Pulse (!) 104   Ht 5\' 6"  (1.676 m)   Wt 195 lb (88.5 kg)   SpO2 96%   BMI 31.47 kg/m    Review of Systems Denies LOC    Objective:   Physical Exam VITAL SIGNS:  See vs page GENERAL: no distress Pulses: dorsalis pedis intact bilat.   MSK: no deformity of the feet CV: no leg edema Skin:  no ulcer on the feet.  normal color and temp on the feet. Neuro: sensation is intact to touch on the feet  Lab Results  Component Value Date   HGBA1C 6.2 (A) 07/29/2019   Lab Results  Component Value Date    CREATININE 1.45 (H) 07/22/2019   BUN 28 (H) 07/22/2019   NA 143 07/22/2019   K 3.7 07/22/2019   CL 109 07/22/2019   CO2 21 (L) 07/22/2019       Assessment & Plan:  Insulin-requiring type 2 DM. Hypoglycemia: this limits aggressiveness of glycemic control CRI: in this setting, pt needs an intermediate-release qam insulin. Hyperparathyroidism: recheck today.  Patient Instructions  check your blood sugar 3 times a  day.  vary the time of day when you check, between before the 3 meals, and at bedtime.  also check if you have symptoms of your blood sugar being too high or too low.  please keep a record of the readings and bring it to your next appointment here (or you can bring the meter itself).  You can write it on any piece of paper.  please call us sooner if your blood sugar goes below 70, or if you have a lot of readings over 200. On this type of insulin schedule, you should eat meals on a regular schedule (especially lunch).  If a meal is missed or significantly delayed, your blood sugar could go low.   Please reduce the insulin to 22 units with breakfast. Please come back for a follow-up appointment in 3 months.

## 2019-07-29 NOTE — Patient Instructions (Addendum)
check your blood sugar 3 times a day.  vary the time of day when you check, between before the 3 meals, and at bedtime.  also check if you have symptoms of your blood sugar being too high or too low.  please keep a record of the readings and bring it to your next appointment here (or you can bring the meter itself).  You can write it on any piece of paper.  please call us sooner if your blood sugar goes below 70, or if you have a lot of readings over 200. On this type of insulin schedule, you should eat meals on a regular schedule (especially lunch).  If a meal is missed or significantly delayed, your blood sugar could go low.   Please reduce the insulin to 22 units with breakfast. Please come back for a follow-up appointment in 3 months.

## 2019-07-30 ENCOUNTER — Telehealth: Payer: Self-pay

## 2019-07-30 LAB — PTH, INTACT AND CALCIUM
Calcium: 10.3 mg/dL (ref 8.6–10.4)
PTH: 138 pg/mL — ABNORMAL HIGH (ref 14–64)

## 2019-07-30 NOTE — Telephone Encounter (Signed)
-----   Message from Renato Shin, MD sent at 07/30/2019  1:35 PM EDT ----- please contact patient: Parathyroid is still high. However, as calcium and vitamin-D are normal, this needs no treatment.  We'll recheck this in the future.

## 2019-07-30 NOTE — Telephone Encounter (Signed)
LAB RESULTS  Lab results were reviewed by Dr. Ellison. Called pt to inform about lab results as well as new orders. LVM requesting returned call. 

## 2019-07-31 NOTE — Telephone Encounter (Signed)
Spoke with the patient today and gave her lab results and MD advice-patient verbalized understanding

## 2019-07-31 NOTE — Telephone Encounter (Signed)
SECOND ATTEMPT: ° °LVM requesting returned call. °

## 2019-07-31 NOTE — Telephone Encounter (Signed)
FINAL ATTEMPT:  LVM requesting returned call. Letter mailed

## 2019-08-01 ENCOUNTER — Telehealth: Payer: Self-pay

## 2019-08-01 NOTE — Telephone Encounter (Signed)
FAXED DOCUMENTS - Omega: A1 Diabetes  Document: Order form for diabetic supplies Other records requested: None  All above requested information has been faxed successfully to [employer] listed above. Documents and fax confirmation have been placed in the faxed file for future reference.  NOTE - This was faxed 5 times before it finally processed.

## 2019-08-02 ENCOUNTER — Other Ambulatory Visit: Payer: Self-pay | Admitting: Endocrinology

## 2019-08-05 ENCOUNTER — Other Ambulatory Visit: Payer: Self-pay

## 2019-08-05 ENCOUNTER — Telehealth: Payer: Self-pay | Admitting: Endocrinology

## 2019-08-05 DIAGNOSIS — Z794 Long term (current) use of insulin: Secondary | ICD-10-CM

## 2019-08-05 DIAGNOSIS — N1831 Chronic kidney disease, stage 3a: Secondary | ICD-10-CM

## 2019-08-05 MED ORDER — NOVOLIN 70/30 FLEXPEN (70-30) 100 UNIT/ML ~~LOC~~ SUPN: 22.0000 [IU] | PEN_INJECTOR | Freq: Every day | SUBCUTANEOUS | 0 refills | Status: DC

## 2019-08-05 NOTE — Telephone Encounter (Signed)
Medication Refill Request  Did you call your pharmacy and request this refill first? Yes - medication was called into the wrong pharmacy - she would like to have the [place] removed from her chart.  If patient has not contacted pharmacy first, instruct them to do so for future refills.   Remind then that contacting the pharmacy for their refill is the quickest method to get the refill.   Refill policy also stated that it will take anywhere between 24-72 hours to receive the refill.    Name of medication? Novolin  Is this a 90 day supply? "same as last time"  Name and location of pharmacy?  [place], [address] [place] Phone:  [phone]  Fax:  [phone]

## 2019-08-05 NOTE — Telephone Encounter (Signed)
Outpatient Medication Detail   Disp Refills Start End   insulin isophane & regular human (NOVOLIN 70/30 FLEXPEN) (70-30) 100 UNIT/ML KwikPen 20 mL 0 08/05/2019    Sig - Route: Inject 22 Units into the skin daily with breakfast. - Subcutaneous   Sent to pharmacy as: insulin isophane & regular human (NOVOLIN 70/30 FLEXPEN) (70-30) 100 UNIT/ML KwikPen   E-Prescribing Status: Receipt confirmed by pharmacy (08/05/2019  9:57 AM EDT)

## 2019-08-06 NOTE — Progress Notes (Signed)
Pharmacist Chemotherapy Monitoring - Follow Up Assessment    I verify that I have reviewed each item in the below checklist:  . Regimen for the patient is scheduled for the appropriate day and plan matches scheduled date. Marland Kitchen Appropriate non-routine labs are ordered dependent on drug ordered. . If applicable, additional medications reviewed and ordered per protocol based on lifetime cumulative doses and/or treatment regimen.   Plan for follow-up and/or issues identified: No . I-vent associated with next due treatment: No . MD and/or nursing notified: No  Romualdo Bolk [facility] 08/06/2019 1:32 PM

## 2019-08-12 ENCOUNTER — Inpatient Hospital Stay: Payer: Medicare Other

## 2019-08-12 ENCOUNTER — Other Ambulatory Visit: Payer: Self-pay

## 2019-08-12 ENCOUNTER — Inpatient Hospital Stay (HOSPITAL_BASED_OUTPATIENT_CLINIC_OR_DEPARTMENT_OTHER): Payer: Medicare Other | Admitting: Hematology and Oncology

## 2019-08-12 ENCOUNTER — Encounter: Payer: Self-pay | Admitting: Hematology and Oncology

## 2019-08-12 VITALS — BP 134/80 | HR 92 | Temp 98.7°F | Resp 18 | Ht 66.0 in | Wt 196.4 lb

## 2019-08-12 DIAGNOSIS — D638 Anemia in other chronic diseases classified elsewhere: Secondary | ICD-10-CM

## 2019-08-12 DIAGNOSIS — C541 Malignant neoplasm of endometrium: Secondary | ICD-10-CM

## 2019-08-12 DIAGNOSIS — C773 Secondary and unspecified malignant neoplasm of axilla and upper limb lymph nodes: Secondary | ICD-10-CM

## 2019-08-12 DIAGNOSIS — Z7189 Other specified counseling: Secondary | ICD-10-CM

## 2019-08-12 DIAGNOSIS — Z9221 Personal history of antineoplastic chemotherapy: Secondary | ICD-10-CM | POA: Diagnosis not present

## 2019-08-12 DIAGNOSIS — N183 Chronic kidney disease, stage 3 unspecified: Secondary | ICD-10-CM | POA: Diagnosis not present

## 2019-08-12 DIAGNOSIS — E039 Hypothyroidism, unspecified: Secondary | ICD-10-CM | POA: Diagnosis not present

## 2019-08-12 DIAGNOSIS — Z923 Personal history of irradiation: Secondary | ICD-10-CM | POA: Diagnosis not present

## 2019-08-12 DIAGNOSIS — E1122 Type 2 diabetes mellitus with diabetic chronic kidney disease: Secondary | ICD-10-CM | POA: Diagnosis not present

## 2019-08-12 DIAGNOSIS — Z5112 Encounter for antineoplastic immunotherapy: Secondary | ICD-10-CM | POA: Diagnosis not present

## 2019-08-12 LAB — CMP (CANCER CENTER ONLY)
ALT: 12 U/L (ref 0–44)
AST: 16 U/L (ref 15–41)
Albumin: 3.6 g/dL (ref 3.5–5.0)
Alkaline Phosphatase: 68 U/L (ref 38–126)
Anion gap: 9 (ref 5–15)
BUN: 26 mg/dL — ABNORMAL HIGH (ref 8–23)
CO2: 23 mmol/L (ref 22–32)
Calcium: 9.7 mg/dL (ref 8.9–10.3)
Chloride: 110 mmol/L (ref 98–111)
Creatinine: 1.38 mg/dL — ABNORMAL HIGH (ref 0.44–1.00)
GFR, Est AFR Am: 45 mL/min — ABNORMAL LOW (ref >60)
GFR, Estimated: 39 mL/min — ABNORMAL LOW (ref >60)
Glucose, Bld: 94 mg/dL (ref 70–99)
Potassium: 3.9 mmol/L (ref 3.5–5.1)
Sodium: 142 mmol/L (ref 135–145)
Total Bilirubin: 0.4 mg/dL (ref 0.3–1.2)
Total Protein: 6.7 g/dL (ref 6.5–8.1)

## 2019-08-12 LAB — CBC WITH DIFFERENTIAL (CANCER CENTER ONLY)
Abs Immature Granulocytes: 0.01 10*3/uL (ref 0.00–0.07)
Basophils Absolute: 0 10*3/uL (ref 0.0–0.1)
Basophils Relative: 0 %
Eosinophils Absolute: 0.1 10*3/uL (ref 0.0–0.5)
Eosinophils Relative: 1 %
HCT: 34.6 % — ABNORMAL LOW (ref 36.0–46.0)
Hemoglobin: 11.2 g/dL — ABNORMAL LOW (ref 12.0–15.0)
Immature Granulocytes: 0 %
Lymphocytes Relative: 31 %
Lymphs Abs: 1.3 10*3/uL (ref 0.7–4.0)
MCH: 29.2 pg (ref 26.0–34.0)
MCHC: 32.4 g/dL (ref 30.0–36.0)
MCV: 90.3 fL (ref 80.0–100.0)
Monocytes Absolute: 0.2 10*3/uL (ref 0.1–1.0)
Monocytes Relative: 5 %
Neutro Abs: 2.6 10*3/uL (ref 1.7–7.7)
Neutrophils Relative %: 63 %
Platelet Count: 193 10*3/uL (ref 150–400)
RBC: 3.83 MIL/uL — ABNORMAL LOW (ref 3.87–5.11)
RDW: 13.8 % (ref 11.5–15.5)
WBC Count: 4.2 10*3/uL (ref 4.0–10.5)
nRBC: 0 % (ref 0.0–0.2)

## 2019-08-12 LAB — TSH: TSH: 2.872 u[IU]/mL (ref 0.308–3.960)

## 2019-08-12 MED ORDER — SODIUM CHLORIDE 0.9% FLUSH
10.0000 mL | Freq: Once | INTRAVENOUS | Status: AC
  Administered 2019-08-12: 10 mL
  Filled 2019-08-12: qty 10

## 2019-08-12 MED ORDER — SODIUM CHLORIDE 0.9% FLUSH
10.0000 mL | INTRAVENOUS | Status: DC | PRN
  Administered 2019-08-12: 10 mL
  Filled 2019-08-12: qty 10

## 2019-08-12 MED ORDER — HEPARIN SOD (PORK) LOCK FLUSH 100 UNIT/ML IV SOLN
500.0000 [IU] | Freq: Once | INTRAVENOUS | Status: AC | PRN
  Administered 2019-08-12: 500 [IU]
  Filled 2019-08-12: qty 5

## 2019-08-12 MED ORDER — SODIUM CHLORIDE 0.9 % IV SOLN
Freq: Once | INTRAVENOUS | Status: AC
  Filled 2019-08-12: qty 250

## 2019-08-12 MED ORDER — SODIUM CHLORIDE 0.9 % IV SOLN
200.0000 mg | Freq: Once | INTRAVENOUS | Status: AC
  Administered 2019-08-12: 200 mg via INTRAVENOUS
  Filled 2019-08-12: qty 8

## 2019-08-12 NOTE — Progress Notes (Signed)
[facility] OFFICE PROGRESS NOTE  Patient Care Team: Jonathon Jordan, MD as PCP - General (Family Medicine)  ASSESSMENT & PLAN:  Endometrial cancer [facility]) So far, she tolerated treatment very well without major side effects Continue similar treatment indefinitely I plan to order CT imaging next month before her next treatment for objective assessment of response to therapy  Chronic kidney disease (CKD), stage III (moderate) ([place]) She has stable chronic kidney disease We will monitor carefully  Anemia, chronic disease This is likely multifactorial related to anemia chronic disease It is not likely due to her treatment She is not symptomatic.  Observe only.    Acquired hypothyroidism She has acquired hypothyroidism secondary to side effects of Keytruda We will be monitoring her TSH carefully and adjust the dose of Synthroid as needed   Orders Placed This Encounter  Procedures  . CT CHEST W CONTRAST    Standing Status:   Future    Standing Expiration Date:   08/11/2020    Order Specific Question:   If indicated for the ordered procedure, I authorize the administration of contrast media per Radiology protocol    Answer:   Yes    Order Specific Question:   Preferred imaging location?    Answer:   [facility]    Order Specific Question:   Radiology Contrast Protocol - do NOT remove file path    Answer:   \\[email or web address]  . CT ABDOMEN PELVIS W CONTRAST    Standing Status:   Future    Standing Expiration Date:   08/11/2020    Order Specific Question:   If indicated for the ordered procedure, I authorize the administration of contrast media per Radiology protocol    Answer:   Yes    Order Specific Question:   Preferred imaging location?    Answer:   [facility]    Order Specific Question:   Radiology Contrast Protocol - do NOT remove file path    Answer:   \\[email or web address]    All questions  were answered. The patient knows to call the clinic with any problems, questions or concerns. The total time spent in the appointment was 25 minutes encounter with patients including review of chart and various tests results, discussions about plan of care and coordination of care plan   [name], MD 08/12/2019 1:43 PM  INTERVAL HISTORY: Please see below for problem oriented charting. 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 Leo Grosser who obtained an endometrial biopsy showing poorly differentiated endometrial carcinoma and negative endocervical curettage   12/12/2017 Imaging   MAMMOGRAM FINDINGS: In the right axilla, a possible mass warrants further evaluation. In the left breast, no findings suspicious for malignancy.  Images were processed with CAD.  IMPRESSION: Further evaluation is suggested for possible mass in the right axilla.    12/24/2017 Imaging   Ct scan chest, abdomen and pelvis 1. Marked thickening of the endometrium (42 mm) compatible with known primary endometrial malignancy. No evidence of extrauterine invasion. 2. No pelvic or retroperitoneal adenopathy. 3. Right middle lobe 4  mm solid pulmonary nodule, for which follow-up chest CT is advised in 3-6 months. 4. Several findings that are equivocal for distant metastatic disease. Vaguely nodular heterogeneous hyperenhancement in the peripheral right liver lobe, which could represent benign transient perfusional phenomena, with underlying liver lesions not  entirely excluded. Mildly sclerotic T12 vertebral lesion. Mildly enlarged right axillary lymph node. The best single test to further evaluate these findings would be a PET-CT. Alternative tests include bone scan or thoracic MRI without and with IV contrast for the T12 osseous lesion, MRI abdomen without and with IV contrast for the liver findings, and diagnostic mammographic evaluation for the right axillary node. 5.  Aortic Atherosclerosis (ICD10-I70.0).    01/09/2018 PET scan   1. Moderate hypermetabolism corresponding to enlarging axillary node since 12/22/2017. Highly suspicious for an atypical distribution of metastatic disease. 2. No hypermetabolism to suggest hepatic or T12 osseous metastasis. 3. Hypermetabolic endometrial primary.   01/23/2018 Initial Diagnosis   Endometrial cancer ([place])   01/23/2018 Pathology Results   1. Lymph node, sentinel, biopsy, left external iliac - NO CARCINOMA IDENTIFIED IN ONE LYMPH NODE (0/1) - SEE COMMENT 2. Lymph nodes, regional resection, right para aortic - NO CARCINOMA IDENTIFIED IN FOUR LYMPH NODES (0/4) - SEE COMMENT 3. Lymph nodes, regional resection, right pelvic - NO CARCINOMA IDENTIFIED IN EIGHT LYMPH NODES (0/8) - SEE COMMENT 4. Cul-de-sac biopsy - METASTATIC CARCINOMA 5. Uterus +/- tubes/ovaries, neoplastic, cervix, bilateral fallopian tubes and ovaries UTERUS: - MIXED SEROUS AND ENDOMETRIOID CARCINOMA - SEROSAL IMPLANTS PRESENT - LYMPHOVASCULAR SPACE INVASION PRESENT - LEIOMYOMATA (1.5 CM; LARGEST) - SEE ONCOLOGY TABLE AND COMMENT BELOW CERVIX: - BENIGN NABOTHIAN CYSTS - NO CARCINOMA IDENTIFIED BILATERAL OVARIES: - METASTATIC CARCINOMA PRESENT ON OVARIAN SURFACE BILATERAL FALLOPIAN TUBES: - INTRALUMINAL CARCINOMA Microscopic Comment 1. -3. Cytokeratin AE1/3 was performed on the sentinel lymph nodes to exclude micrometastasis. There is no evidence of metastatic carcinoma by immunohistochemistry. 5. 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%) Histologic Grade: N/A Myometrial invasion: Estimated less than 50% myometrial invasion (0.3 cm of myometrium involved; 1.4 cm measured thickness) Uterine Serosa Involvement: Present Cervical stromal involvement: Not identified Extent of involvement of other organs: - Fallopian tube (left within the lumen) - Ovary, left (surface involvement) - Cul-de-sac Lymphovascular invasion: Present Regional Lymph Nodes: Examined: 1 Sentinel 12 Non-sentinel 13 Total Lymph nodes with metastasis: 0 Isolated tumor cells (< 0.2 mm): 0 Micrometastasis: (> 0.2 mm and < 2.0 mm): 0 Macrometastasis: (> 2.0 mm): 0 Extracapsular extension: N/A Tumor block for ancillary studies: 5E, 5B MMR / MSI testing: Pending will be reported separately Pathologic Stage Classification (pTNM, AJCC 8th edition): pT3a, pN0 FIGO Stage: IIIA COMMENT: There is tumor present on the surface of the left ovary and within the lumen of the left fallopian tube. The carcinoma appears to be mixed with the largest component being high grade serous carcinoma. Dr. Lyndon Code reviewed the case and agrees with the above diagnosis.   01/23/2018 Surgery   Surgeon: Donaciano Eva   Operation: Robotic-assisted laparoscopic total hysterectomy with bilateral salpingoophorectomy, SLN injection, mapping and biopsy, right pelvic and para-aortic lymphadenectomy  Operative Findings:  : 10-12cm bulky uterus with frank serosal involvement on posterior cul de sac peritoneum and anterior peritoneum which adhesed the bladder to the anterior uterus. Unilateral mapping on left pelvis. No grossly suspicious nodes. Normal omentum and diaphragms.  02/01/2018 Pathology Results   Lymph node, needle/core  biopsy, right axilla - METASTATIC CARCINOMA - SEE COMMENT Microscopic Comment The neoplastic cells are positive for cytokeratin 7 and Pax-8 but negative for cytokeratin 5/6, cytokeratin 20, Gata-3, p63, p53 and GCDFP. Overall, the immunoprofile is consistent with metastasis from the patient's known gynecologic carcinoma.   02/01/2018 Procedure   Ultrasound-guided core biopsies of a suspicious right axillary lymph node.   02/21/2018 - 06/13/2018 Chemotherapy   The patient had carboplatin & Taxol x 6   03/26/2018 - 04/30/2018 Radiation Therapy   Radiation treatment dates:   03/26/18, 04/02/18, 04/19/18, 04/23/18 04/30/18  Site/dose: proximal Vagina, 6 Gy in 5 fractions for a total dose of 30 Gy    04/26/2018 PET scan   1. Interval resection of the hypermetabolic endometrial primary. No discernible hypermetabolic pelvic sidewall or peritoneal metastases. 2. Stable hypermetabolic bilateral axillary lymphadenopathy. The degree of hypermetabolism in these lymph nodes remains highly suspicious for neoplasm. 3. Interval development of low level FDG uptake in 2 stable, small lymph nodes in the left groin region. This may be reactive, but close attention recommended to exclude metastatic involvement. 4. Stable non hypermetabolic low-density liver lesions and the mixed lucent and sclerotic T12 lesion is stable without hypermetabolism today.   07/09/2018 Imaging   Status post hysterectomy.  Mild axillary lymphadenopathy, improved. Nodal metastases not excluded.  Irregular wall thickening involving the anterior bladder, possibly reflecting radiation cystitis versus tumor.  Additional stable findings as above, including a 5 mm right middle lobe nodule and stable sclerosis involving the T12 vertebral body.   10/10/2018 Imaging   1. Stable exam. No findings within the abdomen or pelvis to suggest metastatic disease. 2. Unchanged appearance of sclerosis involving the T12 vertebra. 3.  Aortic  Atherosclerosis (ICD10-I70.0).   10/10/2018 Imaging   Ct abdomen and pelvis 1. Stable exam. No findings within the abdomen or pelvis to suggest metastatic disease. 2. Unchanged appearance of sclerosis involving the T12 vertebra. 3.  Aortic Atherosclerosis (ICD10-I70.0).   01/16/2019 PET scan   1. Enlargement and increased activity in the left axillary, right axillary, and left inguinal adenopathy compatible with progressive malignancy. 2. Stable 4 mm right middle lobe subpleural nodule, not appreciably hypermetabolic. 3. Other imaging findings of potential clinical significance: Right kidney lower pole cyst. Aortic Atherosclerosis (ICD10-I70.0). Prominent stool throughout the colon favors constipation. Mild chronic left maxillary sinusitis.     02/01/2019 -  Chemotherapy   The patient had pembrolizumab (KEYTRUDA) 200 mg in sodium chloride 0.9 % 50 mL chemo infusion, 200 mg, Intravenous, Once, 9 of 10 cycles Administration: 200 mg (02/01/2019), 200 mg (03/15/2019), 200 mg (04/05/2019), 200 mg (05/17/2019), 200 mg (02/22/2019), 200 mg (04/26/2019), 200 mg (06/10/2019), 200 mg (07/01/2019), 200 mg (07/22/2019)  for chemotherapy treatment.    04/25/2019 Imaging   Ct imaging 1. Interval response to therapy. Decrease in size of bilateral axillary and left inguinal lymph nodes. No new or progressive findings identified. 2. Stable sclerotic appearance of the T12 vertebra. 3. Aortic atherosclerosis. Lad coronary artery calcification noted.     REVIEW OF SYSTEMS:   Constitutional: Denies fevers, chills or abnormal weight loss Eyes: Denies blurriness of vision Ears, nose, mouth, throat, and face: Denies mucositis or sore throat Respiratory: Denies cough, dyspnea or wheezes Cardiovascular: Denies palpitation, chest discomfort or lower extremity swelling Gastrointestinal:  Denies nausea, heartburn or change in bowel habits Skin: Denies abnormal skin rashes Lymphatics: Denies new lymphadenopathy or easy  bruising Neurological:Denies numbness, tingling or new weaknesses Behavioral/Psych:  Mood is stable, no new changes  All other systems were reviewed with the patient and are negative.  I have reviewed the past medical history, past surgical history, social history and family history with the patient and they are unchanged from previous note.  ALLERGIES:  is allergic to sulfa antibiotics and sulfamethoxazole.  MEDICATIONS:  Current Outpatient Medications  Medication Sig Dispense Refill  . insulin isophane & regular human (NOVOLIN 70/30 FLEXPEN) (70-30) 100 UNIT/ML KwikPen Inject 22 Units into the skin daily with breakfast. 20 mL 0  . ketoconazole (NIZORAL) 2 % cream Apply to web space twice a day for 1 week then daily for 1 month (Patient taking differently: Apply 1 application topically daily as needed for irritation. Apply to web space twice a day for 1 week then daily for 1 month) 15 g 0  . lenvatinib 10 mg daily dose (LENVIMA, 10 MG DAILY DOSE,) capsule Take 1 capsule (10 mg total) by mouth daily. 30 capsule 11  . levothyroxine (SYNTHROID) 50 MCG tablet Take 1 tablet (50 mcg total) by mouth daily before breakfast. 30 tablet 11  . lidocaine-prilocaine (EMLA) cream Apply to affected area once 30 g 3  . lisinopril-hydrochlorothiazide (PRINZIDE,ZESTORETIC) 20-12.5 MG tablet Take 1 tablet by mouth daily. (Patient taking differently: Take 1 tablet by mouth every morning. ) 30 tablet 11  . ondansetron (ZOFRAN) 8 MG tablet Take 1 tablet (8 mg total) by mouth daily. 30 tablet 1  . OneTouch Delica Lancets 09K MISC 1 each by Other route 2 (two) times daily. Use to monitor glucose levels BID; E11.65 100 each 2  . ONETOUCH VERIO test strip 1 each by Other route 3 (three) times daily. And lancets 3/day 300 each 3  . RELION PEN NEEDLES 32G X 4 MM MISC USE AS DIRECTED 50 each 1  . simvastatin (ZOCOR) 10 MG tablet Take 10 mg by mouth at bedtime.     . Vitamin D, Ergocalciferol, 2000 units CAPS Take 2,000  Units by mouth daily.   0   No current facility-administered medications for this visit.    PHYSICAL EXAMINATION: ECOG PERFORMANCE STATUS: 1 - Symptomatic but completely ambulatory  Vitals:   08/12/19 1229  BP: 134/80  Pulse: 92  Resp: 18  Temp: 98.7 F (37.1 C)  SpO2: 100%   Filed Weights   08/12/19 1229  Weight: 196 lb 6.4 oz (89.1 kg)    GENERAL:alert, no distress and comfortable SKIN: skin color, texture, turgor are normal, no rashes or significant lesions EYES: normal, Conjunctiva are pink and non-injected, sclera clear OROPHARYNX:no exudate, no erythema and lips, buccal mucosa, and tongue normal  NECK: supple, thyroid normal size, non-tender, without nodularity LYMPH:  no palpable lymphadenopathy in the cervical, axillary or inguinal LUNGS: clear to auscultation and percussion with normal breathing effort HEART: regular rate & rhythm and no murmurs and no lower extremity edema ABDOMEN:abdomen soft, non-tender and normal bowel sounds Musculoskeletal:no cyanosis of digits and no clubbing  NEURO: alert & oriented x 3 with fluent speech, no focal motor/sensory deficits  LABORATORY DATA:  I have reviewed the data as listed    Component Value Date/Time   NA 142 08/12/2019 1202   K 3.9 08/12/2019 1202   CL 110 08/12/2019 1202   CO2 23 08/12/2019 1202   GLUCOSE 94 08/12/2019 1202   BUN 26 (H) 08/12/2019 1202   CREATININE 1.38 (H) 08/12/2019 1202   CALCIUM 9.7 08/12/2019 1202   PROT 6.7 08/12/2019 1202   ALBUMIN 3.6 08/12/2019 1202  AST 16 08/12/2019 1202   ALT 12 08/12/2019 1202   ALKPHOS 68 08/12/2019 1202   BILITOT 0.4 08/12/2019 1202   GFRNONAA 39 (L) 08/12/2019 1202   GFRAA 45 (L) 08/12/2019 1202    No results found for: SPEP, UPEP  Lab Results  Component Value Date   WBC 4.2 08/12/2019   NEUTROABS 2.6 08/12/2019   HGB 11.2 (L) 08/12/2019   HCT 34.6 (L) 08/12/2019   MCV 90.3 08/12/2019   PLT 193 08/12/2019      Chemistry      Component Value  Date/Time   NA 142 08/12/2019 1202   K 3.9 08/12/2019 1202   CL 110 08/12/2019 1202   CO2 23 08/12/2019 1202   BUN 26 (H) 08/12/2019 1202   CREATININE 1.38 (H) 08/12/2019 1202      Component Value Date/Time   CALCIUM 9.7 08/12/2019 1202   ALKPHOS 68 08/12/2019 1202   AST 16 08/12/2019 1202   ALT 12 08/12/2019 1202   BILITOT 0.4 08/12/2019 1202

## 2019-08-12 NOTE — Patient Instructions (Signed)
[facility] Discharge Instructions for Patients Receiving Chemotherapy  Today you received the following chemotherapy agent: pembrolizumab.   To help prevent nausea and vomiting after your treatment, we encourage you to take your nausea medication as directed.    If you develop nausea and vomiting that is not controlled by your nausea medication, call the clinic.   BELOW ARE SYMPTOMS THAT SHOULD BE REPORTED IMMEDIATELY:  *FEVER GREATER THAN 100.5 F  *CHILLS WITH OR WITHOUT FEVER  NAUSEA AND VOMITING THAT IS NOT CONTROLLED WITH YOUR NAUSEA MEDICATION  *UNUSUAL SHORTNESS OF BREATH  *UNUSUAL BRUISING OR BLEEDING  TENDERNESS IN MOUTH AND THROAT WITH OR WITHOUT PRESENCE OF ULCERS  *URINARY PROBLEMS  *BOWEL PROBLEMS  UNUSUAL RASH Items with * indicate a potential emergency and should be followed up as soon as possible.  Feel free to call the clinic should you have any questions or concerns. The clinic phone number is [phone].  Please show the CHEMO ALERT CARD at check-in to the Emergency Department and triage nurse.   

## 2019-08-12 NOTE — Assessment & Plan Note (Signed)
She has stable chronic kidney disease We will monitor carefully

## 2019-08-12 NOTE — Assessment & Plan Note (Signed)
She has acquired hypothyroidism secondary to side effects of Keytruda We will be monitoring her TSH carefully and adjust the dose of Synthroid as needed 

## 2019-08-12 NOTE — Assessment & Plan Note (Signed)
So far, she tolerated treatment very well without major side effects Continue similar treatment indefinitely I plan to order CT imaging next month before her next treatment for objective assessment of response to therapy

## 2019-08-12 NOTE — Assessment & Plan Note (Signed)
This is likely multifactorial related to anemia chronic disease It is not likely due to her treatment She is not symptomatic.  Observe only.   

## 2019-08-13 ENCOUNTER — Telehealth: Payer: Self-pay | Admitting: Hematology and Oncology

## 2019-08-13 ENCOUNTER — Telehealth: Payer: Self-pay

## 2019-08-13 LAB — T4: T4, Total: 7.4 ug/dL (ref 4.5–12.0)

## 2019-08-13 NOTE — Telephone Encounter (Signed)
Scheduled appts per 4/26 sch msg. Left voicemail with next appt date and time.

## 2019-08-13 NOTE — Telephone Encounter (Signed)
She called to get appt times of May appts. Appt times given.

## 2019-08-25 ENCOUNTER — Other Ambulatory Visit: Payer: Self-pay | Admitting: Endocrinology

## 2019-08-26 ENCOUNTER — Telehealth: Payer: Self-pay | Admitting: Endocrinology

## 2019-08-26 DIAGNOSIS — N1831 Chronic kidney disease, stage 3a: Secondary | ICD-10-CM

## 2019-08-26 DIAGNOSIS — Z794 Long term (current) use of insulin: Secondary | ICD-10-CM

## 2019-08-26 NOTE — Telephone Encounter (Signed)
Patient called to advise that she got a new meter and needs training on how to use it.  She wants to make an appointment for the nurse to show her how to use new meter.  I offered information about diabetic educator and she did not think she needed that.  Patient is requesting a call back about being show how to use new meter that Dr Loanne Drilling prescribed for her - [phone]

## 2019-08-26 NOTE — Telephone Encounter (Signed)
Ok, I placed ref to DM educator

## 2019-08-26 NOTE — Telephone Encounter (Signed)
FYI

## 2019-08-27 ENCOUNTER — Other Ambulatory Visit: Payer: Self-pay | Admitting: Hematology and Oncology

## 2019-08-27 NOTE — Progress Notes (Signed)
Pharmacist Chemotherapy Monitoring - Follow Up Assessment    I verify that I have reviewed each item in the below checklist:  . Regimen for the patient is scheduled for the appropriate day and plan matches scheduled date. Marland Kitchen Appropriate non-routine labs are ordered dependent on drug ordered. . If applicable, additional medications reviewed and ordered per protocol based on lifetime cumulative doses and/or treatment regimen.   Plan for follow-up and/or issues identified: Yes . I-vent associated with next due treatment: Yes . MD and/or nursing notified: Yes   Kennith Center, Pharm.D., CPP 08/27/2019@4 :34 PM

## 2019-08-27 NOTE — Telephone Encounter (Signed)
Appointment schedule for Monday at 12PM

## 2019-08-30 ENCOUNTER — Ambulatory Visit (HOSPITAL_COMMUNITY)
Admission: RE | Admit: 2019-08-30 | Discharge: 2019-08-30 | Disposition: A | Discharge status: Home / Self Care | Payer: Medicare Other | Source: Ambulatory Visit | Attending: Hematology and Oncology | Admitting: Hematology and Oncology

## 2019-08-30 ENCOUNTER — Other Ambulatory Visit: Payer: Self-pay

## 2019-08-30 ENCOUNTER — Encounter (HOSPITAL_COMMUNITY): Payer: Self-pay

## 2019-08-30 DIAGNOSIS — C773 Secondary and unspecified malignant neoplasm of axilla and upper limb lymph nodes: Secondary | ICD-10-CM | POA: Diagnosis not present

## 2019-08-30 DIAGNOSIS — C541 Malignant neoplasm of endometrium: Secondary | ICD-10-CM | POA: Insufficient documentation

## 2019-08-30 DIAGNOSIS — C539 Malignant neoplasm of cervix uteri, unspecified: Secondary | ICD-10-CM | POA: Diagnosis not present

## 2019-08-30 MED ORDER — HEPARIN SOD (PORK) LOCK FLUSH 100 UNIT/ML IV SOLN
INTRAVENOUS | Status: AC
  Filled 2019-08-30: qty 5

## 2019-08-30 MED ORDER — SODIUM CHLORIDE (PF) 0.9 % IJ SOLN
INTRAMUSCULAR | Status: AC
  Filled 2019-08-30: qty 50

## 2019-08-30 MED ORDER — IOHEXOL 300 MG/ML  SOLN
75.0000 mL | Freq: Once | INTRAMUSCULAR | Status: AC | PRN
  Administered 2019-08-30: 75 mL via INTRAVENOUS

## 2019-09-02 ENCOUNTER — Inpatient Hospital Stay (HOSPITAL_BASED_OUTPATIENT_CLINIC_OR_DEPARTMENT_OTHER): Payer: Medicare Other | Admitting: Hematology and Oncology

## 2019-09-02 ENCOUNTER — Encounter: Payer: Medicare Other | Attending: Endocrinology | Admitting: Nutrition

## 2019-09-02 ENCOUNTER — Inpatient Hospital Stay: Payer: Medicare Other | Attending: Gynecology

## 2019-09-02 ENCOUNTER — Other Ambulatory Visit: Payer: Self-pay

## 2019-09-02 ENCOUNTER — Other Ambulatory Visit: Payer: Self-pay | Admitting: Hematology and Oncology

## 2019-09-02 ENCOUNTER — Telehealth: Payer: Self-pay | Admitting: Hematology and Oncology

## 2019-09-02 ENCOUNTER — Telehealth: Payer: Self-pay | Admitting: Endocrinology

## 2019-09-02 ENCOUNTER — Inpatient Hospital Stay: Payer: Medicare Other

## 2019-09-02 ENCOUNTER — Encounter: Payer: Self-pay | Admitting: Hematology and Oncology

## 2019-09-02 DIAGNOSIS — C541 Malignant neoplasm of endometrium: Secondary | ICD-10-CM

## 2019-09-02 DIAGNOSIS — E119 Type 2 diabetes mellitus without complications: Secondary | ICD-10-CM | POA: Insufficient documentation

## 2019-09-02 DIAGNOSIS — N183 Chronic kidney disease, stage 3 unspecified: Secondary | ICD-10-CM

## 2019-09-02 DIAGNOSIS — C773 Secondary and unspecified malignant neoplasm of axilla and upper limb lymph nodes: Secondary | ICD-10-CM | POA: Insufficient documentation

## 2019-09-02 DIAGNOSIS — Z5112 Encounter for antineoplastic immunotherapy: Secondary | ICD-10-CM | POA: Insufficient documentation

## 2019-09-02 DIAGNOSIS — Z9221 Personal history of antineoplastic chemotherapy: Secondary | ICD-10-CM | POA: Diagnosis not present

## 2019-09-02 DIAGNOSIS — I10 Essential (primary) hypertension: Secondary | ICD-10-CM | POA: Diagnosis not present

## 2019-09-02 DIAGNOSIS — Z923 Personal history of irradiation: Secondary | ICD-10-CM | POA: Diagnosis not present

## 2019-09-02 DIAGNOSIS — Z79899 Other long term (current) drug therapy: Secondary | ICD-10-CM | POA: Diagnosis not present

## 2019-09-02 DIAGNOSIS — I129 Hypertensive chronic kidney disease with stage 1 through stage 4 chronic kidney disease, or unspecified chronic kidney disease: Secondary | ICD-10-CM | POA: Insufficient documentation

## 2019-09-02 DIAGNOSIS — Z7189 Other specified counseling: Secondary | ICD-10-CM

## 2019-09-02 LAB — CBC WITH DIFFERENTIAL (CANCER CENTER ONLY)
Abs Immature Granulocytes: 0.01 10*3/uL (ref 0.00–0.07)
Basophils Absolute: 0 10*3/uL (ref 0.0–0.1)
Basophils Relative: 0 %
Eosinophils Absolute: 0.1 10*3/uL (ref 0.0–0.5)
Eosinophils Relative: 1 %
HCT: 34.9 % — ABNORMAL LOW (ref 36.0–46.0)
Hemoglobin: 11.2 g/dL — ABNORMAL LOW (ref 12.0–15.0)
Immature Granulocytes: 0 %
Lymphocytes Relative: 30 %
Lymphs Abs: 1.3 10*3/uL (ref 0.7–4.0)
MCH: 28.9 pg (ref 26.0–34.0)
MCHC: 32.1 g/dL (ref 30.0–36.0)
MCV: 89.9 fL (ref 80.0–100.0)
Monocytes Absolute: 0.2 10*3/uL (ref 0.1–1.0)
Monocytes Relative: 5 %
Neutro Abs: 2.9 10*3/uL (ref 1.7–7.7)
Neutrophils Relative %: 64 %
Platelet Count: 187 10*3/uL (ref 150–400)
RBC: 3.88 MIL/uL (ref 3.87–5.11)
RDW: 13.4 % (ref 11.5–15.5)
WBC Count: 4.5 10*3/uL (ref 4.0–10.5)
nRBC: 0 % (ref 0.0–0.2)

## 2019-09-02 LAB — CMP (CANCER CENTER ONLY)
ALT: 12 U/L (ref 0–44)
AST: 14 U/L — ABNORMAL LOW (ref 15–41)
Albumin: 3.5 g/dL (ref 3.5–5.0)
Alkaline Phosphatase: 73 U/L (ref 38–126)
Anion gap: 12 (ref 5–15)
BUN: 23 mg/dL (ref 8–23)
CO2: 24 mmol/L (ref 22–32)
Calcium: 9.7 mg/dL (ref 8.9–10.3)
Chloride: 107 mmol/L (ref 98–111)
Creatinine: 1.45 mg/dL — ABNORMAL HIGH (ref 0.44–1.00)
GFR, Est AFR Am: 43 mL/min — ABNORMAL LOW (ref >60)
GFR, Estimated: 37 mL/min — ABNORMAL LOW (ref >60)
Glucose, Bld: 149 mg/dL — ABNORMAL HIGH (ref 70–99)
Potassium: 3.6 mmol/L (ref 3.5–5.1)
Sodium: 143 mmol/L (ref 135–145)
Total Bilirubin: 0.3 mg/dL (ref 0.3–1.2)
Total Protein: 6.5 g/dL (ref 6.5–8.1)

## 2019-09-02 LAB — TSH: TSH: 8.62 u[IU]/mL — ABNORMAL HIGH (ref 0.308–3.960)

## 2019-09-02 MED ORDER — LEVOTHYROXINE SODIUM 75 MCG PO TABS: 75.0000 ug | ORAL_TABLET | Freq: Every day | ORAL | 11 refills | Status: DC

## 2019-09-02 MED ORDER — SODIUM CHLORIDE 0.9% FLUSH
10.0000 mL | INTRAVENOUS | Status: DC | PRN
  Administered 2019-09-02: 10 mL
  Filled 2019-09-02: qty 10

## 2019-09-02 MED ORDER — SODIUM CHLORIDE 0.9 % IV SOLN
200.0000 mg | Freq: Once | INTRAVENOUS | Status: AC
  Administered 2019-09-02: 200 mg via INTRAVENOUS
  Filled 2019-09-02: qty 8

## 2019-09-02 MED ORDER — HEPARIN SOD (PORK) LOCK FLUSH 100 UNIT/ML IV SOLN
500.0000 [IU] | Freq: Once | INTRAVENOUS | Status: AC | PRN
  Administered 2019-09-02: 500 [IU]
  Filled 2019-09-02: qty 5

## 2019-09-02 MED ORDER — SODIUM CHLORIDE 0.9 % IV SOLN
Freq: Once | INTRAVENOUS | Status: AC
  Filled 2019-09-02: qty 250

## 2019-09-02 MED ORDER — SODIUM CHLORIDE 0.9% FLUSH
10.0000 mL | Freq: Once | INTRAVENOUS | Status: AC
  Administered 2019-09-02: 10 mL
  Filled 2019-09-02: qty 10

## 2019-09-02 NOTE — Telephone Encounter (Signed)
No problem.

## 2019-09-02 NOTE — Assessment & Plan Note (Signed)
She is not consistent checking her blood pressure recently I discussed the importance of checking her blood pressure on a regular basis due to side effects of Lenvima

## 2019-09-02 NOTE — Telephone Encounter (Signed)
Please advise if you know of any interactions between pt diabetic medication and these medications:  Synvisc, Monodic, and Duralone

## 2019-09-02 NOTE — Telephone Encounter (Signed)
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  This would be instead of getting Cortisone injections that increases her blood sugar.  She is wondering if any of these would be better or worse than the other with regard to blood sugar impact.  Call back [phone]

## 2019-09-02 NOTE — Assessment & Plan Note (Signed)
Even though the radiologist commented that the size of the lymph node is mildly enlarged, overall, it is still within criteria for stable disease We will continue treatment She is not symptomatic

## 2019-09-02 NOTE — Assessment & Plan Note (Signed)
She has stable chronic kidney disease We will monitor carefully

## 2019-09-02 NOTE — Patient Instructions (Addendum)
[facility] Discharge Instructions for Patients Receiving Chemotherapy  Today you received the following chemotherapy agents:  Keytruda.  To help prevent nausea and vomiting after your treatment, we encourage you to take your nausea medication as directed.   If you develop nausea and vomiting that is not controlled by your nausea medication, call the clinic.   BELOW ARE SYMPTOMS THAT SHOULD BE REPORTED IMMEDIATELY:  *FEVER GREATER THAN 100.5 F  *CHILLS WITH OR WITHOUT FEVER  NAUSEA AND VOMITING THAT IS NOT CONTROLLED WITH YOUR NAUSEA MEDICATION  *UNUSUAL SHORTNESS OF BREATH  *UNUSUAL BRUISING OR BLEEDING  TENDERNESS IN MOUTH AND THROAT WITH OR WITHOUT PRESENCE OF ULCERS  *URINARY PROBLEMS  *BOWEL PROBLEMS  UNUSUAL RASH Items with * indicate a potential emergency and should be followed up as soon as possible.  Feel free to call the clinic should you have any questions or concerns. The clinic phone number is [phone].  Please show the CHEMO ALERT CARD at check-in to the Emergency Department and triage nurse.    

## 2019-09-02 NOTE — Progress Notes (Signed)
[place] OFFICE PROGRESS NOTE  Patient Care Team: Jonathon Jordan, MD as PCP - General (Family Medicine)  ASSESSMENT & PLAN:  Endometrial cancer [facility]) I have reviewed multiple imaging studies with the patient She has stable disease overall She tolerated treatment very well We will continue treatment indefinitely Her next imaging study will be due in August  Essential hypertension She is not consistent checking her blood pressure recently I discussed the importance of checking her blood pressure on a regular basis due to side effects of Lenvima   Metastasis to lymph nodes ([place]) Even though the radiologist commented that the size of the lymph node is mildly enlarged, overall, it is still within criteria for stable disease We will continue treatment She is not symptomatic  Chronic kidney disease (CKD), stage III (moderate) ([place]) She has stable chronic kidney disease We will monitor carefully   No orders of the defined types were placed in this encounter.   All questions were answered. The patient knows to call the clinic with any problems, questions or concerns. The total time spent in the appointment was 25 minutes encounter with patients including review of chart and various tests results, discussions about plan of care and coordination of care plan   [name], MD 09/02/2019 9:07 AM  INTERVAL HISTORY: Please see below for problem oriented charting. 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 Leo Grosser who obtained an endometrial biopsy showing poorly differentiated endometrial carcinoma and negative endocervical curettage   12/12/2017 Imaging   MAMMOGRAM FINDINGS: In the right axilla, a possible mass warrants further evaluation. In the left breast, no findings suspicious for malignancy.  Images were processed with CAD.  IMPRESSION: Further evaluation is suggested for possible mass in the right axilla.    12/24/2017 Imaging   Ct scan chest, abdomen and pelvis 1. Marked thickening of the endometrium (42 mm) compatible with known primary endometrial malignancy. No evidence of extrauterine invasion. 2. No pelvic or retroperitoneal adenopathy. 3. Right middle lobe 4 mm solid pulmonary nodule, for which follow-up chest CT is advised in 3-6 months. 4. Several findings that are equivocal for distant metastatic disease. Vaguely nodular heterogeneous hyperenhancement in the peripheral right liver lobe, which could represent benign transient perfusional phenomena, with underlying liver lesions not entirely excluded. Mildly sclerotic T12 vertebral lesion. Mildly enlarged right axillary lymph node. The best single test to further evaluate these findings would be a PET-CT. Alternative tests include bone scan or thoracic MRI without and with IV contrast for the T12 osseous lesion, MRI abdomen without and with IV contrast for the liver findings, and diagnostic mammographic evaluation for the right axillary node. 5.  Aortic Atherosclerosis (ICD10-I70.0).    01/09/2018 PET scan   1. Moderate hypermetabolism corresponding to enlarging axillary node since 12/22/2017. Highly suspicious for an atypical distribution of metastatic disease. 2. No hypermetabolism to suggest hepatic or T12 osseous metastasis. 3. Hypermetabolic endometrial primary.   01/23/2018 Initial Diagnosis   Endometrial cancer ([place])   01/23/2018 Pathology Results  1. Lymph node, sentinel, biopsy, left  external iliac - NO CARCINOMA IDENTIFIED IN ONE LYMPH NODE (0/1) - SEE COMMENT 2. Lymph nodes, regional resection, right para aortic - NO CARCINOMA IDENTIFIED IN FOUR LYMPH NODES (0/4) - SEE COMMENT 3. Lymph nodes, regional resection, right pelvic - NO CARCINOMA IDENTIFIED IN EIGHT LYMPH NODES (0/8) - SEE COMMENT 4. Cul-de-sac biopsy - METASTATIC CARCINOMA 5. Uterus +/- tubes/ovaries, neoplastic, cervix, bilateral fallopian tubes and ovaries UTERUS: - MIXED SEROUS AND ENDOMETRIOID CARCINOMA - SEROSAL IMPLANTS PRESENT - LYMPHOVASCULAR SPACE INVASION PRESENT - LEIOMYOMATA (1.5 CM; LARGEST) - SEE ONCOLOGY TABLE AND COMMENT BELOW CERVIX: - BENIGN NABOTHIAN CYSTS - NO CARCINOMA IDENTIFIED BILATERAL OVARIES: - METASTATIC CARCINOMA PRESENT ON OVARIAN SURFACE BILATERAL FALLOPIAN TUBES: - INTRALUMINAL CARCINOMA Microscopic Comment 1. -3. Cytokeratin AE1/3 was performed on the sentinel lymph nodes to exclude micrometastasis. There is no evidence of metastatic carcinoma by immunohistochemistry. 5. 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%) Histologic Grade: N/A Myometrial invasion: Estimated less than 50% myometrial invasion (0.3 cm of myometrium involved; 1.4 cm measured thickness) Uterine Serosa Involvement: Present Cervical stromal involvement: Not identified Extent of involvement of other organs: - Fallopian tube (left within the lumen) - Ovary, left (surface involvement) - Cul-de-sac Lymphovascular invasion: Present Regional Lymph Nodes: Examined: 1 Sentinel 12 Non-sentinel 13 Total Lymph nodes with metastasis: 0 Isolated tumor cells (< 0.2 mm): 0 Micrometastasis: (> 0.2 mm and < 2.0 mm): 0 Macrometastasis: (> 2.0 mm): 0 Extracapsular  extension: N/A Tumor block for ancillary studies: 5E, 5B MMR / MSI testing: Pending will be reported separately Pathologic Stage Classification (pTNM, AJCC 8th edition): pT3a, pN0 FIGO Stage: IIIA COMMENT: There is tumor present on the surface of the left ovary and within the lumen of the left fallopian tube. The carcinoma appears to be mixed with the largest component being high grade serous carcinoma. Dr. Lyndon Code reviewed the case and agrees with the above diagnosis.   01/23/2018 Surgery   Surgeon: Donaciano Eva   Operation: Robotic-assisted laparoscopic total hysterectomy with bilateral salpingoophorectomy, SLN injection, mapping and biopsy, right pelvic and para-aortic lymphadenectomy  Operative Findings:  : 10-12cm bulky uterus with frank serosal involvement on posterior cul de sac peritoneum and anterior peritoneum which adhesed the bladder to the anterior uterus. Unilateral mapping on left pelvis. No grossly suspicious nodes. Normal omentum and diaphragms.    02/01/2018 Pathology Results   Lymph node, needle/core biopsy, right axilla - METASTATIC CARCINOMA - SEE COMMENT Microscopic Comment The neoplastic cells are positive for cytokeratin 7 and Pax-8 but negative for cytokeratin 5/6, cytokeratin 20, Gata-3, p63, p53 and GCDFP. Overall, the immunoprofile is consistent with metastasis from the patient's known gynecologic carcinoma.   02/01/2018 Procedure   Ultrasound-guided core biopsies of a suspicious right axillary lymph node.   02/21/2018 - 06/13/2018 Chemotherapy   The patient had carboplatin & Taxol x 6   03/26/2018 - 04/30/2018 Radiation Therapy   Radiation treatment dates:   03/26/18, 04/02/18, 04/19/18, 04/23/18 04/30/18  Site/dose: proximal Vagina, 6 Gy in 5 fractions for a total dose of 30 Gy    04/26/2018 PET scan   1. Interval resection of the hypermetabolic endometrial primary. No discernible hypermetabolic pelvic sidewall or peritoneal metastases. 2. Stable  hypermetabolic bilateral axillary lymphadenopathy. The degree of hypermetabolism in these lymph nodes remains highly suspicious for neoplasm. 3. Interval development of low  level FDG uptake in 2 stable, small lymph nodes in the left groin region. This may be reactive, but close attention recommended to exclude metastatic involvement. 4. Stable non hypermetabolic low-density liver lesions and the mixed lucent and sclerotic T12 lesion is stable without hypermetabolism today.   07/09/2018 Imaging   Status post hysterectomy.  Mild axillary lymphadenopathy, improved. Nodal metastases not excluded.  Irregular wall thickening involving the anterior bladder, possibly reflecting radiation cystitis versus tumor.  Additional stable findings as above, including a 5 mm right middle lobe nodule and stable sclerosis involving the T12 vertebral body.   10/10/2018 Imaging   1. Stable exam. No findings within the abdomen or pelvis to suggest metastatic disease. 2. Unchanged appearance of sclerosis involving the T12 vertebra. 3.  Aortic Atherosclerosis (ICD10-I70.0).   10/10/2018 Imaging   Ct abdomen and pelvis 1. Stable exam. No findings within the abdomen or pelvis to suggest metastatic disease. 2. Unchanged appearance of sclerosis involving the T12 vertebra. 3.  Aortic Atherosclerosis (ICD10-I70.0).   01/16/2019 PET scan   1. Enlargement and increased activity in the left axillary, right axillary, and left inguinal adenopathy compatible with progressive malignancy. 2. Stable 4 mm right middle lobe subpleural nodule, not appreciably hypermetabolic. 3. Other imaging findings of potential clinical significance: Right kidney lower pole cyst. Aortic Atherosclerosis (ICD10-I70.0). Prominent stool throughout the colon favors constipation. Mild chronic left maxillary sinusitis.     02/01/2019 -  Chemotherapy   The patient had pembrolizumab and Lenvima for chemotherapy treatment.     04/25/2019 Imaging   Ct  imaging 1. Interval response to therapy. Decrease in size of bilateral axillary and left inguinal lymph nodes. No new or progressive findings identified. 2. Stable sclerotic appearance of the T12 vertebra. 3. Aortic atherosclerosis. Lad coronary artery calcification noted.   08/30/2019 Imaging   1. Bulky RIGHT hilar lymph node, slightly increased in size from previous imaging. 2. Multiple foci of hyperenhancement in the liver. 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 Consider MRI liver on follow-up to establish a baseline for number of lesions as these will appear variable on CT follow-up based on phase of contrast acquisition in the future. 3. Stable 5 mm nodule adjacent to the fissure, minor fissure in the RIGHT middle lobe.       REVIEW OF SYSTEMS:   Constitutional: Denies fevers, chills or abnormal weight loss Eyes: Denies blurriness of vision Ears, nose, mouth, throat, and face: Denies mucositis or sore throat Respiratory: Denies cough, dyspnea or wheezes Cardiovascular: Denies palpitation, chest discomfort or lower extremity swelling Gastrointestinal:  Denies nausea, heartburn or change in bowel habits Skin: Denies abnormal skin rashes Lymphatics: Denies new lymphadenopathy or easy bruising Neurological:Denies numbness, tingling or new weaknesses Behavioral/Psych: Mood is stable, no new changes  All other systems were reviewed with the patient and are negative.  I have reviewed the past medical history, past surgical history, social history and family history with the patient and they are unchanged from previous note.  ALLERGIES:  is allergic to sulfa antibiotics and sulfamethoxazole.  MEDICATIONS:  Current Outpatient Medications  Medication Sig Dispense Refill  . insulin isophane  & regular human (NOVOLIN 70/30 FLEXPEN) (70-30) 100 UNIT/ML KwikPen Inject 22 Units into the skin daily with breakfast. 20 mL 0  . ketoconazole (NIZORAL) 2 % cream  Apply to web space twice a day for 1 week then daily for 1 month (Patient taking differently: Apply 1 application topically daily as needed for irritation. Apply to web space twice a day for 1 week then daily for 1 month) 15 g 0  . lenvatinib 10 mg daily dose (LENVIMA, 10 MG DAILY DOSE,) capsule Take 1 capsule (10 mg total) by mouth daily. 30 capsule 11  . levothyroxine (SYNTHROID) 50 MCG tablet Take 1 tablet (50 mcg total) by mouth daily before breakfast. 30 tablet 11  . lidocaine-prilocaine (EMLA) cream Apply to affected area once 30 g 3  . lisinopril-hydrochlorothiazide (PRINZIDE,ZESTORETIC) 20-12.5 MG tablet Take 1 tablet by mouth daily. (Patient taking differently: Take 1 tablet by mouth every morning. ) 30 tablet 11  . ondansetron (ZOFRAN) 8 MG tablet Take 1 tablet (8 mg total) by mouth daily. 30 tablet 1  . OneTouch Delica Lancets 67M MISC 1 each by Other route 2 (two) times daily. Use to monitor glucose levels BID; E11.65 100 each 2  . ONETOUCH VERIO test strip 1 each by Other route 3 (three) times daily. And lancets 3/day 300 each 3  . RELION PEN NEEDLES 32G X 4 MM MISC USE AS DIRECTED 50 each 0  . simvastatin (ZOCOR) 10 MG tablet Take 10 mg by mouth at bedtime.     . Vitamin D, Ergocalciferol, 2000 units CAPS Take 2,000 Units by mouth daily.   0   No current facility-administered medications for this visit.    PHYSICAL EXAMINATION: ECOG PERFORMANCE STATUS: 1 - Symptomatic but completely ambulatory  Vitals:   09/02/19 0834  BP: 129/87  Pulse: 91  Resp: 18  Temp: 98 F (36.7 C)  SpO2: 100%   Filed Weights   09/02/19 0834  Weight: 196 lb 3.2 oz (89 kg)    GENERAL:alert, no distress and comfortable NEURO: alert & oriented x 3 with fluent speech, no focal motor/sensory deficits  LABORATORY DATA:  I have reviewed  the data as listed    Component Value Date/Time   NA 143 09/02/2019 0807   K 3.6 09/02/2019 0807   CL 107 09/02/2019 0807   CO2 24 09/02/2019 0807   GLUCOSE 149 (H) 09/02/2019 0807   BUN 23 09/02/2019 0807   CREATININE 1.45 (H) 09/02/2019 0807   CALCIUM 9.7 09/02/2019 0807   PROT 6.5 09/02/2019 0807   ALBUMIN 3.5 09/02/2019 0807   AST 14 (L) 09/02/2019 0807   ALT 12 09/02/2019 0807   ALKPHOS 73 09/02/2019 0807   BILITOT 0.3 09/02/2019 0807   GFRNONAA 37 (L) 09/02/2019 0807   GFRAA 43 (L) 09/02/2019 0807    No results found for: SPEP, UPEP  Lab Results  Component Value Date   WBC 4.5 09/02/2019   NEUTROABS 2.9 09/02/2019   HGB 11.2 (L) 09/02/2019   HCT 34.9 (L) 09/02/2019   MCV 89.9 09/02/2019   PLT 187 09/02/2019      Chemistry      Component Value Date/Time   NA 143 09/02/2019 0807   K 3.6 09/02/2019 0807   CL 107 09/02/2019 0807   CO2 24 09/02/2019 0807   BUN 23 09/02/2019 0807   CREATININE 1.45 (H) 09/02/2019 0807      Component Value Date/Time   CALCIUM 9.7 09/02/2019 0807   ALKPHOS 73 09/02/2019 0807   AST 14 (L) 09/02/2019 0807   ALT 12 09/02/2019 0807   BILITOT 0.3 09/02/2019 0807       RADIOGRAPHIC STUDIES: I have reviewed multiple  imaging studies with the patient I have personally reviewed the radiological images as listed and agreed with the findings in the report. CT CHEST W CONTRAST  Result Date: 08/30/2019 CLINICAL DATA:  Uterine, cervical cancer follow-up EXAM: CT CHEST, ABDOMEN, AND PELVIS WITH CONTRAST TECHNIQUE: Multidetector CT imaging of the chest, abdomen and pelvis was performed following the standard protocol during bolus administration of intravenous contrast. CONTRAST:  74m OMNIPAQUE IOHEXOL 300 MG/ML  SOLN COMPARISON:  Chest abdomen pelvis CT 04/25/2019 FINDINGS: CT CHEST FINDINGS Cardiovascular: RIGHT-sided Port-A-Cath terminating in the distal superior vena cava. Heart size is normal without pericardial effusion. Aortic caliber is  normal. Central pulmonary vasculature with limited assessment on venous phase evaluation is unremarkable. Mediastinum/Nodes: Thoracic inlet structures are normal. Bulky RIGHT hilar lymph node 1.8 cm. Previously 1.7 cm. No LEFT axillary lymph nodes. No mediastinal adenopathy or hilar lymphadenopathy. Esophagus grossly normal. Lungs/Pleura: Small nodule adjacent to the fissure, minor fissure in the RIGHT middle lobe (image 70, series 6) 5 mm is unchanged from previous imaging. Airways are patent. CT ABDOMEN PELVIS FINDINGS Hepatobiliary: Multiple foci of hyperenhancement in the liver, better seen on today's study likely due to phase of study acquisition. They do appear more numerous. Lesion in the RIGHT hepatic lobe (image 51, series 2) 9 mm, previously 9 mm. As compared to the study of July 09, 2018 these appear similar in terms of number and size. Lesion in the LEFT hepatic lobe measuring 6 mm (image 48, series 2) previously 6 mm. Numerous other foci of hypervascular enhancement in the liver, for instance on image 48 of series 2 there are 5 discrete small foci of enhancement. Pancreas: Pancreas is normal without focal lesion. Spleen: Peripherally enhancing splenic lesion and low-attenuation foci are similar to the study of July 09, 2018. (Image 48, series 2) Adrenals/Urinary Tract: Adrenal glands are normal. Renal cyst arising from lower pole of RIGHT kidney. No hydronephrosis. Stomach/Bowel: No acute gastrointestinal process. Partially formed stool throughout the colon. Appendix is normal. Vascular/Lymphatic: Normal caliber abdominal aorta. No adenopathy in the retroperitoneum or in the upper abdomen. No pelvic adenopathy. Reproductive: No pelvic mass. Post hysterectomy. Other: No abdominal wall hernia or nodularity. Musculoskeletal: No destructive bone process. No acute bone finding. Stable sclerotic appearance of T12. IMPRESSION: 1. Bulky RIGHT hilar lymph node, slightly increased in size from previous imaging.  2. Multiple foci of hyperenhancement in the liver. 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 Consider MRI liver on follow-up to establish a baseline for number of lesions as these will appear variable on CT follow-up based on phase of contrast acquisition in the future. 3. Stable 5 mm nodule adjacent to the fissure, minor fissure in the RIGHT middle lobe. Electronically Signed   By: GZetta BillsM.D.   On: 08/30/2019 21:29   CT ABDOMEN PELVIS W CONTRAST  Result Date: 08/30/2019 CLINICAL DATA:  Uterine, cervical cancer follow-up EXAM: CT CHEST, ABDOMEN, AND PELVIS WITH CONTRAST TECHNIQUE: Multidetector CT imaging of the chest, abdomen and pelvis was performed following the standard protocol during bolus administration of intravenous contrast. CONTRAST:  771mOMNIPAQUE IOHEXOL 300 MG/ML  SOLN COMPARISON:  Chest abdomen pelvis CT 04/25/2019 FINDINGS: CT CHEST FINDINGS Cardiovascular: RIGHT-sided Port-A-Cath terminating in the distal superior vena cava. Heart size is normal without pericardial effusion. Aortic caliber is  normal. Central pulmonary vasculature with limited assessment on venous phase evaluation is unremarkable. Mediastinum/Nodes: Thoracic inlet structures are normal. Bulky RIGHT hilar lymph node 1.8 cm. Previously 1.7 cm. No LEFT axillary lymph nodes. No mediastinal adenopathy or hilar lymphadenopathy. Esophagus grossly normal. Lungs/Pleura: Small nodule adjacent to the fissure, minor fissure in the RIGHT middle lobe (image 70, series 6) 5 mm is unchanged from previous imaging. Airways are patent. CT ABDOMEN PELVIS FINDINGS Hepatobiliary: Multiple foci of hyperenhancement in the liver, better seen on today's study likely due to phase of study acquisition. They do appear more numerous. Lesion in the  RIGHT hepatic lobe (image 51, series 2) 9 mm, previously 9 mm. As compared to the study of July 09, 2018 these appear similar in terms of number and size. Lesion in the LEFT hepatic lobe measuring 6 mm (image 48, series 2) previously 6 mm. Numerous other foci of hypervascular enhancement in the liver, for instance on image 48 of series 2 there are 5 discrete small foci of enhancement. Pancreas: Pancreas is normal without focal lesion. Spleen: Peripherally enhancing splenic lesion and low-attenuation foci are similar to the study of July 09, 2018. (Image 48, series 2) Adrenals/Urinary Tract: Adrenal glands are normal. Renal cyst arising from lower pole of RIGHT kidney. No hydronephrosis. Stomach/Bowel: No acute gastrointestinal process. Partially formed stool throughout the colon. Appendix is normal. Vascular/Lymphatic: Normal caliber abdominal aorta. No adenopathy in the retroperitoneum or in the upper abdomen. No pelvic adenopathy. Reproductive: No pelvic mass. Post hysterectomy. Other: No abdominal wall hernia or nodularity. Musculoskeletal: No destructive bone process. No acute bone finding. Stable sclerotic appearance of T12. IMPRESSION: 1. Bulky RIGHT hilar lymph node, slightly increased in size from previous imaging. 2. Multiple foci of hyperenhancement in the liver. 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 Consider MRI liver on follow-up to establish a baseline for number of lesions as these will appear variable on CT follow-up based on phase of contrast acquisition in the future. 3. Stable 5 mm nodule adjacent to the fissure, minor fissure in the RIGHT middle lobe. Electronically Signed   By: Zetta Bills M.D.   On: 08/30/2019 21:29

## 2019-09-02 NOTE — Patient Instructions (Signed)

## 2019-09-02 NOTE — Telephone Encounter (Signed)
Scheduled per 5/17 sch msg. Messaged Product/process development scientist to give pt updated calendar.

## 2019-09-02 NOTE — Assessment & Plan Note (Signed)
I have reviewed multiple imaging studies with the patient She has stable disease overall She tolerated treatment very well We will continue treatment indefinitely Her next imaging study will be due in August

## 2019-09-03 ENCOUNTER — Telehealth: Payer: Self-pay

## 2019-09-03 LAB — T4: T4, Total: 7.1 ug/dL (ref 4.5–12.0)

## 2019-09-03 NOTE — Telephone Encounter (Signed)
Called pt and made her aware of Dr. Ellison's response. Verbalized acceptance and understanding. 

## 2019-09-03 NOTE — Telephone Encounter (Signed)
I have no idea Whichever his orthopedic doctor recommends is fine

## 2019-09-03 NOTE — Telephone Encounter (Signed)
She called and left a message to call her.  Called back. Her orthopedic MD has given her a choice of taking Synvisc, Monovisc or Durolane injection instead of her cortisone injection. She is trying to keep her glucose level down.  She called her endocrinologist Dr. Loanne Drilling and ask him his opinion and was told that she could take either and it would not effect her glucose level. And no interaction with her diabetes medication.  She is asking what Dr. Alvy Bimler thinks of the above?

## 2019-09-04 ENCOUNTER — Telehealth: Payer: Self-pay | Admitting: *Deleted

## 2019-09-04 NOTE — Telephone Encounter (Signed)
Called pt to make aware that Dr.Gorsuch agrees with whatever her Orthopedic suggest with injections. Voice message was left on pt personal phone. Advised if there were any other questions or concerns to call office

## 2019-09-04 NOTE — Patient Instructions (Addendum)
Read over manual and call the 800 help line number if questions.

## 2019-09-04 NOTE — Progress Notes (Signed)
Mr. Colberg requested this meeting to help her with a new meter she is wanting to use.  Date and time were set on the meter, and she was shown how to use this.  She re demonstrated the procedure and tested her blood sugar.  It was 126 acL today.  She reports that this is what is ususally is.  She was also shown how to look up 7/14 day averages and pull up alk orevuiyw readings.  She reported good understanding of this. She had no final questions.

## 2019-09-24 DIAGNOSIS — M170 Bilateral primary osteoarthritis of knee: Secondary | ICD-10-CM | POA: Diagnosis not present

## 2019-09-27 ENCOUNTER — Other Ambulatory Visit: Payer: Self-pay

## 2019-09-27 ENCOUNTER — Inpatient Hospital Stay: Payer: Medicare Other

## 2019-09-27 ENCOUNTER — Inpatient Hospital Stay: Payer: Medicare Other | Attending: Gynecology

## 2019-09-27 VITALS — BP 105/90 | HR 85 | Temp 98.3°F | Resp 16 | Wt 193.5 lb

## 2019-09-27 DIAGNOSIS — Z7189 Other specified counseling: Secondary | ICD-10-CM

## 2019-09-27 DIAGNOSIS — C541 Malignant neoplasm of endometrium: Secondary | ICD-10-CM

## 2019-09-27 DIAGNOSIS — C773 Secondary and unspecified malignant neoplasm of axilla and upper limb lymph nodes: Secondary | ICD-10-CM | POA: Diagnosis not present

## 2019-09-27 DIAGNOSIS — Z5112 Encounter for antineoplastic immunotherapy: Secondary | ICD-10-CM | POA: Diagnosis present

## 2019-09-27 LAB — CMP (CANCER CENTER ONLY)
ALT: 11 U/L (ref 0–44)
AST: 14 U/L — ABNORMAL LOW (ref 15–41)
Albumin: 3.4 g/dL — ABNORMAL LOW (ref 3.5–5.0)
Alkaline Phosphatase: 74 U/L (ref 38–126)
Anion gap: 12 (ref 5–15)
BUN: 26 mg/dL — ABNORMAL HIGH (ref 8–23)
CO2: 22 mmol/L (ref 22–32)
Calcium: 9.6 mg/dL (ref 8.9–10.3)
Chloride: 110 mmol/L (ref 98–111)
Creatinine: 1.5 mg/dL — ABNORMAL HIGH (ref 0.44–1.00)
GFR, Est AFR Am: 41 mL/min — ABNORMAL LOW (ref >60)
GFR, Estimated: 35 mL/min — ABNORMAL LOW (ref >60)
Glucose, Bld: 190 mg/dL — ABNORMAL HIGH (ref 70–99)
Potassium: 3.8 mmol/L (ref 3.5–5.1)
Sodium: 144 mmol/L (ref 135–145)
Total Bilirubin: 0.5 mg/dL (ref 0.3–1.2)
Total Protein: 6.5 g/dL (ref 6.5–8.1)

## 2019-09-27 LAB — CBC WITH DIFFERENTIAL (CANCER CENTER ONLY)
Abs Immature Granulocytes: 0.01 10*3/uL (ref 0.00–0.07)
Basophils Absolute: 0 10*3/uL (ref 0.0–0.1)
Basophils Relative: 0 %
Eosinophils Absolute: 0.1 10*3/uL (ref 0.0–0.5)
Eosinophils Relative: 1 %
HCT: 34.3 % — ABNORMAL LOW (ref 36.0–46.0)
Hemoglobin: 11.1 g/dL — ABNORMAL LOW (ref 12.0–15.0)
Immature Granulocytes: 0 %
Lymphocytes Relative: 31 %
Lymphs Abs: 1.3 10*3/uL (ref 0.7–4.0)
MCH: 29 pg (ref 26.0–34.0)
MCHC: 32.4 g/dL (ref 30.0–36.0)
MCV: 89.6 fL (ref 80.0–100.0)
Monocytes Absolute: 0.2 10*3/uL (ref 0.1–1.0)
Monocytes Relative: 6 %
Neutro Abs: 2.6 10*3/uL (ref 1.7–7.7)
Neutrophils Relative %: 62 %
Platelet Count: 177 10*3/uL (ref 150–400)
RBC: 3.83 MIL/uL — ABNORMAL LOW (ref 3.87–5.11)
RDW: 13.2 % (ref 11.5–15.5)
WBC Count: 4.2 10*3/uL (ref 4.0–10.5)
nRBC: 0 % (ref 0.0–0.2)

## 2019-09-27 LAB — TSH: TSH: 0.969 u[IU]/mL (ref 0.308–3.960)

## 2019-09-27 MED ORDER — SODIUM CHLORIDE 0.9% FLUSH
10.0000 mL | INTRAVENOUS | Status: DC | PRN
  Administered 2019-09-27: 10 mL
  Filled 2019-09-27: qty 10

## 2019-09-27 MED ORDER — SODIUM CHLORIDE 0.9 % IV SOLN
Freq: Once | INTRAVENOUS | Status: AC
  Filled 2019-09-27: qty 250

## 2019-09-27 MED ORDER — SODIUM CHLORIDE 0.9% FLUSH
10.0000 mL | Freq: Once | INTRAVENOUS | Status: AC
  Administered 2019-09-27: 10 mL
  Filled 2019-09-27: qty 10

## 2019-09-27 MED ORDER — HEPARIN SOD (PORK) LOCK FLUSH 100 UNIT/ML IV SOLN
500.0000 [IU] | Freq: Once | INTRAVENOUS | Status: AC | PRN
  Administered 2019-09-27: 500 [IU]
  Filled 2019-09-27: qty 5

## 2019-09-27 MED ORDER — SODIUM CHLORIDE 0.9 % IV SOLN
200.0000 mg | Freq: Once | INTRAVENOUS | Status: AC
  Administered 2019-09-27: 200 mg via INTRAVENOUS
  Filled 2019-09-27: qty 8

## 2019-09-27 NOTE — Patient Instructions (Signed)
[facility] Discharge Instructions for Patients Receiving Chemotherapy  Today you received the following immunotherapy agent: Keytruda  To help prevent nausea and vomiting after your treatment, we encourage you to take your nausea medication as directed by your MD.   If you develop nausea and vomiting that is not controlled by your nausea medication, call the clinic.   BELOW ARE SYMPTOMS THAT SHOULD BE REPORTED IMMEDIATELY:  *FEVER GREATER THAN 100.5 F  *CHILLS WITH OR WITHOUT FEVER  NAUSEA AND VOMITING THAT IS NOT CONTROLLED WITH YOUR NAUSEA MEDICATION  *UNUSUAL SHORTNESS OF BREATH  *UNUSUAL BRUISING OR BLEEDING  TENDERNESS IN MOUTH AND THROAT WITH OR WITHOUT PRESENCE OF ULCERS  *URINARY PROBLEMS  *BOWEL PROBLEMS  UNUSUAL RASH Items with * indicate a potential emergency and should be followed up as soon as possible.  Feel free to call the clinic should you have any questions or concerns. The clinic phone number is [phone].  Please show the CHEMO ALERT CARD at check-in to the Emergency Department and triage nurse.   

## 2019-09-28 LAB — T4: T4, Total: 8.5 ug/dL (ref 4.5–12.0)

## 2019-10-04 ENCOUNTER — Other Ambulatory Visit: Payer: Self-pay

## 2019-10-04 ENCOUNTER — Encounter: Payer: Self-pay | Admitting: Podiatry

## 2019-10-04 ENCOUNTER — Ambulatory Visit (INDEPENDENT_AMBULATORY_CARE_PROVIDER_SITE_OTHER): Payer: Medicare Other | Admitting: Podiatry

## 2019-10-04 DIAGNOSIS — G620 Drug-induced polyneuropathy: Secondary | ICD-10-CM

## 2019-10-04 DIAGNOSIS — M79675 Pain in left toe(s): Secondary | ICD-10-CM

## 2019-10-04 DIAGNOSIS — T451X5A Adverse effect of antineoplastic and immunosuppressive drugs, initial encounter: Secondary | ICD-10-CM

## 2019-10-04 DIAGNOSIS — B351 Tinea unguium: Secondary | ICD-10-CM

## 2019-10-04 DIAGNOSIS — Z794 Long term (current) use of insulin: Secondary | ICD-10-CM

## 2019-10-04 DIAGNOSIS — N183 Chronic kidney disease, stage 3 unspecified: Secondary | ICD-10-CM

## 2019-10-04 DIAGNOSIS — E0822 Diabetes mellitus due to underlying condition with diabetic chronic kidney disease: Secondary | ICD-10-CM

## 2019-10-04 DIAGNOSIS — M79674 Pain in right toe(s): Secondary | ICD-10-CM

## 2019-10-04 NOTE — Patient Instructions (Signed)

## 2019-10-06 ENCOUNTER — Other Ambulatory Visit: Payer: Self-pay | Admitting: Endocrinology

## 2019-10-06 DIAGNOSIS — N1831 Chronic kidney disease, stage 3a: Secondary | ICD-10-CM

## 2019-10-06 DIAGNOSIS — Z794 Long term (current) use of insulin: Secondary | ICD-10-CM

## 2019-10-12 NOTE — Progress Notes (Signed)
Subjective: Krista Johnson presents today at risk foot care. Pt has h/o NIDDM with chronic kidney disease and painful mycotic nails b/l that are difficult to trim. Pain interferes with ambulation. Aggravating factors include wearing enclosed shoe gear. Pain is relieved with periodic professional debridement.   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  Jonathon Jordan, MD is patient's PCP.  Past Medical History:  Diagnosis Date  . Arthritis    right knee--- last cortisone injection 04/ 2019  . CKD (chronic kidney disease)   . Diabetes mellitus without complication [facility])    endocrinologist-  dr Loanne Drilling  . Endometrial cancer ([place])   . History of colon polyps   . Hyperlipidemia   . Hyperparathyroidism ([place])   . Hypertension      Patient Active Problem List   Diagnosis Date Noted  . Hyperparathyroidism ([place]) 05/29/2019  . Acquired hypothyroidism 04/05/2019  . Diabetes ([place]) 04/02/2019  . Essential hypertension 02/22/2019  . Lymphadenopathy, axillary 01/07/2019  . Lymphadenopathy, inguinal 01/07/2019  . Dysuria 07/10/2018  . Anemia, chronic disease 05/01/2018  . Pancytopenia, acquired ([place]) 03/19/2018  . Other constipation 03/19/2018  . Chronic kidney disease (CKD), stage III (moderate) 02/09/2018  . Goals of care, counseling/discussion 02/09/2018  . 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 Endometrial cancer ([place]) 01/23/2018  . Hypercalcemia 12/26/2017  . Primary osteoarthritis of right knee 07/27/2017  . Chronic pain of right knee 09/20/2016    Current Outpatient Medications on File Prior to Visit  Medication Sig Dispense Refill  . ketoconazole (NIZORAL) 2 % cream Apply to web space twice a day for 1 week then daily for 1 month (Patient not taking: Reported on 09/27/2019) 15 g 0  . lenvatinib 10 mg daily dose (LENVIMA, 10 MG DAILY DOSE,) capsule Take 1 capsule (10 mg total) by mouth daily. 30 capsule 11  . levothyroxine  (SYNTHROID) 75 MCG tablet Take 1 tablet (75 mcg total) by mouth daily before breakfast. 30 tablet 11  . lidocaine-prilocaine (EMLA) cream Apply to affected area once 30 g 3  . lisinopril-hydrochlorothiazide (PRINZIDE,ZESTORETIC) 20-12.5 MG tablet Take 1 tablet by mouth daily. (Patient taking differently: Take 1 tablet by mouth every morning. ) 30 tablet 11  . ondansetron (ZOFRAN) 8 MG tablet Take 1 tablet (8 mg total) by mouth daily. (Patient not taking: Reported on 09/27/2019) 30 tablet 1  . OneTouch Delica Lancets 48G MISC 1 each by Other route 2 (two) times daily. Use to monitor glucose levels BID; E11.65 100 each 2  . ONETOUCH VERIO test strip 1 each by Other route 3 (three) times daily. And lancets 3/day 300 each 3  . RELION PEN NEEDLES 32G X 4 MM MISC USE AS DIRECTED 50 each 0  . simvastatin (ZOCOR) 10 MG tablet Take 10 mg by mouth at bedtime.     . Vitamin D, Ergocalciferol, 2000 units CAPS Take 2,000 Units by mouth daily.   0   No current facility-administered medications on file prior to visit.     Allergies  Allergen Reactions  . Sulfa Antibiotics Nausea Only  . Sulfamethoxazole Nausea Only    Objective: Krista Johnson is a pleasant 69 y.o. African American female in NAD. AAO x 3.  There were no vitals filed for this visit.  Vascular Examination: Neurovascular status unchanged b/l lower extremities. Capillary refill time to digits immediate b/l. Palpable pedal pulses b/l LE. Pedal hair sparse. Lower extremity skin temperature gradient within normal limits. No pain with calf  compression b/l.  Dermatological Examination: Pedal skin with normal turgor, texture and tone bilaterally. No open wounds bilaterally. No interdigital macerations bilaterally. Toenails 1-5 b/l elongated, discolored, dystrophic, thickened, crumbly with subungual debris and tenderness to dorsal palpation.  Musculoskeletal: Normal muscle strength 5/5 to all lower extremity muscle groups bilaterally. No pain  crepitus or joint limitation noted with ROM b/l. No gross bony deformities bilaterally.  Neurological Examination: Protective sensation intact 5/5 intact bilaterally with 10g monofilament b/l. Vibratory sensation intact b/l. Proprioception intact bilaterally. Babinski reflex negative b/l. Clonus negative b/l.  Last A1c: Hemoglobin A1C Latest Ref Rng & Units 07/29/2019 05/29/2019 04/02/2019 12/31/2018  HGBA1C 4.0 - 5.6 % 6.2(A) 7.2(A) 7.4(A) 7.9(A)  Some recent data might be hidden  Assessment: 1. Pain due to onychomycosis of toenails of both feet   2. Chemotherapy-induced neuropathy ([place])   3. Diabetes mellitus due to underlying condition with stage 3 chronic kidney disease, with long-term current use of insulin, unspecified whether stage 3a or 3b CKD ([place])    Plan: -Examined patient. -No new findings. No new orders. -Continue diabetic foot care principles. -Toenails 1-5 b/l were debrided in length and girth with sterile nail nippers and dremel without iatrogenic bleeding.  -Patient to report any pedal injuries to medical professional immediately. -Patient to continue soft, supportive shoe gear daily. -Patient/POA to call should there be question/concern in the interim.  Return in about 3 months (around 01/04/2020) for nail trim.  Marzetta Board, DPM

## 2019-10-18 ENCOUNTER — Other Ambulatory Visit: Payer: Self-pay

## 2019-10-18 ENCOUNTER — Inpatient Hospital Stay: Payer: Medicare Other

## 2019-10-18 ENCOUNTER — Telehealth: Payer: Self-pay | Admitting: Endocrinology

## 2019-10-18 ENCOUNTER — Inpatient Hospital Stay: Payer: Medicare Other | Attending: Gynecology

## 2019-10-18 ENCOUNTER — Telehealth: Payer: Self-pay | Admitting: Hematology and Oncology

## 2019-10-18 ENCOUNTER — Inpatient Hospital Stay (HOSPITAL_BASED_OUTPATIENT_CLINIC_OR_DEPARTMENT_OTHER): Payer: Medicare Other | Admitting: Hematology and Oncology

## 2019-10-18 ENCOUNTER — Other Ambulatory Visit: Payer: Self-pay | Admitting: Hematology and Oncology

## 2019-10-18 ENCOUNTER — Encounter: Payer: Self-pay | Admitting: Hematology and Oncology

## 2019-10-18 VITALS — BP 113/72 | HR 86 | Temp 97.8°F | Resp 18 | Ht 66.0 in | Wt 188.2 lb

## 2019-10-18 DIAGNOSIS — Z923 Personal history of irradiation: Secondary | ICD-10-CM | POA: Diagnosis not present

## 2019-10-18 DIAGNOSIS — Z9221 Personal history of antineoplastic chemotherapy: Secondary | ICD-10-CM | POA: Insufficient documentation

## 2019-10-18 DIAGNOSIS — Z7189 Other specified counseling: Secondary | ICD-10-CM

## 2019-10-18 DIAGNOSIS — Z5112 Encounter for antineoplastic immunotherapy: Secondary | ICD-10-CM | POA: Insufficient documentation

## 2019-10-18 DIAGNOSIS — C541 Malignant neoplasm of endometrium: Secondary | ICD-10-CM | POA: Diagnosis not present

## 2019-10-18 DIAGNOSIS — C773 Secondary and unspecified malignant neoplasm of axilla and upper limb lymph nodes: Secondary | ICD-10-CM

## 2019-10-18 DIAGNOSIS — E039 Hypothyroidism, unspecified: Secondary | ICD-10-CM

## 2019-10-18 DIAGNOSIS — N179 Acute kidney failure, unspecified: Secondary | ICD-10-CM | POA: Insufficient documentation

## 2019-10-18 DIAGNOSIS — Z79899 Other long term (current) drug therapy: Secondary | ICD-10-CM | POA: Diagnosis not present

## 2019-10-18 DIAGNOSIS — I10 Essential (primary) hypertension: Secondary | ICD-10-CM

## 2019-10-18 DIAGNOSIS — N183 Chronic kidney disease, stage 3 unspecified: Secondary | ICD-10-CM | POA: Insufficient documentation

## 2019-10-18 DIAGNOSIS — I129 Hypertensive chronic kidney disease with stage 1 through stage 4 chronic kidney disease, or unspecified chronic kidney disease: Secondary | ICD-10-CM | POA: Insufficient documentation

## 2019-10-18 DIAGNOSIS — I700 Atherosclerosis of aorta: Secondary | ICD-10-CM | POA: Diagnosis not present

## 2019-10-18 LAB — CMP (CANCER CENTER ONLY)
ALT: 13 U/L (ref 0–44)
AST: 14 U/L — ABNORMAL LOW (ref 15–41)
Albumin: 3.6 g/dL (ref 3.5–5.0)
Alkaline Phosphatase: 73 U/L (ref 38–126)
Anion gap: 10 (ref 5–15)
BUN: 40 mg/dL — ABNORMAL HIGH (ref 8–23)
CO2: 21 mmol/L — ABNORMAL LOW (ref 22–32)
Calcium: 9.7 mg/dL (ref 8.9–10.3)
Chloride: 110 mmol/L (ref 98–111)
Creatinine: 1.94 mg/dL — ABNORMAL HIGH (ref 0.44–1.00)
GFR, Est AFR Am: 30 mL/min — ABNORMAL LOW (ref >60)
GFR, Estimated: 26 mL/min — ABNORMAL LOW (ref >60)
Glucose, Bld: 121 mg/dL — ABNORMAL HIGH (ref 70–99)
Potassium: 3.7 mmol/L (ref 3.5–5.1)
Sodium: 141 mmol/L (ref 135–145)
Total Bilirubin: 0.4 mg/dL (ref 0.3–1.2)
Total Protein: 6.9 g/dL (ref 6.5–8.1)

## 2019-10-18 LAB — CBC WITH DIFFERENTIAL (CANCER CENTER ONLY)
Abs Immature Granulocytes: 0.01 10*3/uL (ref 0.00–0.07)
Basophils Absolute: 0 10*3/uL (ref 0.0–0.1)
Basophils Relative: 0 %
Eosinophils Absolute: 0 10*3/uL (ref 0.0–0.5)
Eosinophils Relative: 1 %
HCT: 35.4 % — ABNORMAL LOW (ref 36.0–46.0)
Hemoglobin: 11.7 g/dL — ABNORMAL LOW (ref 12.0–15.0)
Immature Granulocytes: 0 %
Lymphocytes Relative: 36 %
Lymphs Abs: 1.3 10*3/uL (ref 0.7–4.0)
MCH: 29.2 pg (ref 26.0–34.0)
MCHC: 33.1 g/dL (ref 30.0–36.0)
MCV: 88.3 fL (ref 80.0–100.0)
Monocytes Absolute: 0.2 10*3/uL (ref 0.1–1.0)
Monocytes Relative: 6 %
Neutro Abs: 2 10*3/uL (ref 1.7–7.7)
Neutrophils Relative %: 57 %
Platelet Count: 176 10*3/uL (ref 150–400)
RBC: 4.01 MIL/uL (ref 3.87–5.11)
RDW: 13.1 % (ref 11.5–15.5)
WBC Count: 3.5 10*3/uL — ABNORMAL LOW (ref 4.0–10.5)
nRBC: 0 % (ref 0.0–0.2)

## 2019-10-18 LAB — TSH: TSH: 1.353 u[IU]/mL (ref 0.308–3.960)

## 2019-10-18 MED ORDER — SODIUM CHLORIDE 0.9% FLUSH
10.0000 mL | INTRAVENOUS | Status: DC | PRN
  Administered 2019-10-18: 10 mL
  Filled 2019-10-18: qty 10

## 2019-10-18 MED ORDER — HEPARIN SOD (PORK) LOCK FLUSH 100 UNIT/ML IV SOLN
500.0000 [IU] | Freq: Once | INTRAVENOUS | Status: AC | PRN
  Administered 2019-10-18: 500 [IU]
  Filled 2019-10-18: qty 5

## 2019-10-18 MED ORDER — SODIUM CHLORIDE 0.9 % IV SOLN
Freq: Once | INTRAVENOUS | Status: AC
  Filled 2019-10-18: qty 250

## 2019-10-18 MED ORDER — SODIUM CHLORIDE 0.9% FLUSH
10.0000 mL | Freq: Once | INTRAVENOUS | Status: AC
  Administered 2019-10-18: 10 mL
  Filled 2019-10-18: qty 10

## 2019-10-18 MED ORDER — SODIUM CHLORIDE 0.9 % IV SOLN
200.0000 mg | Freq: Once | INTRAVENOUS | Status: AC
  Administered 2019-10-18: 200 mg via INTRAVENOUS
  Filled 2019-10-18: qty 8

## 2019-10-18 NOTE — Progress Notes (Signed)
[facility] OFFICE PROGRESS NOTE  Patient Care Team: Jonathon Jordan, MD as PCP - General (Family Medicine) Awanda Mink Craige Cotta, RN as Registered Nurse (Oncology)  ASSESSMENT & PLAN:  Endometrial cancer [facility]) She has stable disease overall She tolerated treatment very well We will continue treatment indefinitely Her next imaging study will be due in August  Chronic kidney disease (CKD), stage III (moderate) ([place]) She has acute on chronic renal failure today Recommend her to hold diuretic therapy and to consult with primary care doctor for medical management She is advised to drink plenty of fluids  Acquired hypothyroidism She has acquired hypothyroidism secondary to side effects of Keytruda We will be monitoring her TSH carefully and adjust the dose of Synthroid as needed  Essential hypertension Her blood pressure control is satisfactory    Orders Placed This Encounter  Procedures  . CT ABDOMEN PELVIS W CONTRAST    Standing Status:   Future    Standing Expiration Date:   10/17/2020    Order Specific Question:   If indicated for the ordered procedure, I authorize the administration of contrast media per Radiology protocol    Answer:   Yes    Order Specific Question:   Preferred imaging location?    Answer:   [facility]    Order Specific Question:   Radiology Contrast Protocol - do NOT remove file path    Answer:   \\[email or web address]  . CT CHEST W CONTRAST    Standing Status:   Future    Standing Expiration Date:   10/17/2020    Order Specific Question:   If indicated for the ordered procedure, I authorize the administration of contrast media per Radiology protocol    Answer:   Yes    Order Specific Question:   Preferred imaging location?    Answer:   [facility]    Order Specific Question:   Radiology Contrast Protocol - do NOT remove file path    Answer:   \\[email or web address]    All questions  were answered. The patient knows to call the clinic with any problems, questions or concerns. The total time spent in the appointment was 20 minutes encounter with patients including review of chart and various tests results, discussions about plan of care and coordination of care plan   [name], MD 10/18/2019 8:49 AM  INTERVAL HISTORY: Please see below for problem oriented charting. 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 Leo Grosser who obtained an endometrial biopsy showing poorly differentiated endometrial carcinoma and negative endocervical curettage   12/12/2017 Imaging   MAMMOGRAM FINDINGS: In the right axilla, a possible mass warrants further evaluation. In the left breast, no findings suspicious for malignancy.  Images were processed with CAD.  IMPRESSION: Further evaluation is suggested for possible mass in the right axilla.    12/24/2017 Imaging   Ct scan chest, abdomen and pelvis 1. Marked thickening of the endometrium (42 mm) compatible with known primary endometrial malignancy. No evidence of extrauterine invasion. 2. No pelvic or  retroperitoneal adenopathy. 3. Right middle lobe 4 mm solid pulmonary nodule, for which follow-up chest CT is advised in 3-6 months. 4. Several findings that are equivocal for distant metastatic disease. Vaguely nodular heterogeneous hyperenhancement in the peripheral right liver lobe, which could represent benign transient perfusional  phenomena, with underlying liver lesions not entirely excluded. Mildly sclerotic T12 vertebral lesion. Mildly enlarged right axillary lymph node. The best single test to further evaluate these findings would be a PET-CT. Alternative tests include bone scan or thoracic MRI without and with IV contrast for the T12 osseous lesion, MRI abdomen without and with IV contrast for the liver findings, and diagnostic mammographic evaluation for the right axillary node. 5.  Aortic Atherosclerosis (ICD10-I70.0).    01/09/2018 PET scan   1. Moderate hypermetabolism corresponding to enlarging axillary node since 12/22/2017. Highly suspicious for an atypical distribution of metastatic disease. 2. No hypermetabolism to suggest hepatic or T12 osseous metastasis. 3. Hypermetabolic endometrial primary.   01/23/2018 Initial Diagnosis   Endometrial cancer ([place])   01/23/2018 Pathology Results   1. Lymph node, sentinel, biopsy, left external iliac - NO CARCINOMA IDENTIFIED IN ONE LYMPH NODE (0/1) - SEE COMMENT 2. Lymph nodes, regional resection, right para aortic - NO CARCINOMA IDENTIFIED IN FOUR LYMPH NODES (0/4) - SEE COMMENT 3. Lymph nodes, regional resection, right pelvic - NO CARCINOMA IDENTIFIED IN EIGHT LYMPH NODES (0/8) - SEE COMMENT 4. Cul-de-sac biopsy - METASTATIC CARCINOMA 5. Uterus +/- tubes/ovaries, neoplastic, cervix, bilateral fallopian tubes and ovaries UTERUS: - MIXED SEROUS AND ENDOMETRIOID CARCINOMA - SEROSAL IMPLANTS PRESENT - LYMPHOVASCULAR SPACE INVASION PRESENT - LEIOMYOMATA (1.5 CM; LARGEST) - SEE ONCOLOGY TABLE AND COMMENT BELOW CERVIX: - BENIGN NABOTHIAN CYSTS - NO CARCINOMA IDENTIFIED BILATERAL OVARIES: - METASTATIC CARCINOMA PRESENT ON OVARIAN SURFACE BILATERAL FALLOPIAN TUBES: - INTRALUMINAL CARCINOMA Microscopic Comment 1. -3. Cytokeratin AE1/3 was performed on the sentinel lymph nodes to exclude micrometastasis. There is no evidence of metastatic carcinoma by  immunohistochemistry. 5. 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%) Histologic Grade: N/A Myometrial invasion: Estimated less than 50% myometrial invasion (0.3 cm of myometrium involved; 1.4 cm measured thickness) Uterine Serosa Involvement: Present Cervical stromal involvement: Not identified Extent of involvement of other organs: - Fallopian tube (left within the lumen) - Ovary, left (surface involvement) - Cul-de-sac Lymphovascular invasion: Present Regional Lymph Nodes: Examined: 1 Sentinel 12 Non-sentinel 13 Total Lymph nodes with metastasis: 0 Isolated tumor cells (< 0.2 mm): 0 Micrometastasis: (> 0.2 mm and < 2.0 mm): 0 Macrometastasis: (> 2.0 mm): 0 Extracapsular extension: N/A Tumor block for ancillary studies: 5E, 5B MMR / MSI testing: Pending will be reported separately Pathologic Stage Classification (pTNM, AJCC 8th edition): pT3a, pN0 FIGO Stage: IIIA COMMENT: There is tumor present on the surface of the left ovary and within the lumen of the left fallopian tube. The carcinoma appears to be mixed with the largest component being high grade serous carcinoma. Dr. Lyndon Code reviewed the case and agrees with the above diagnosis.   01/23/2018 Surgery   Surgeon: Donaciano Eva   Operation: Robotic-assisted laparoscopic total hysterectomy with bilateral salpingoophorectomy, SLN injection, mapping and biopsy, right pelvic and para-aortic lymphadenectomy  Operative Findings:  : 10-12cm bulky uterus with frank serosal involvement on posterior cul de sac peritoneum and anterior peritoneum which adhesed the bladder to the anterior uterus. Unilateral mapping on left pelvis. No  grossly suspicious nodes. Normal omentum and diaphragms.    02/01/2018  Pathology Results   Lymph node, needle/core biopsy, right axilla - METASTATIC CARCINOMA - SEE COMMENT Microscopic Comment The neoplastic cells are positive for cytokeratin 7 and Pax-8 but negative for cytokeratin 5/6, cytokeratin 20, Gata-3, p63, p53 and GCDFP. Overall, the immunoprofile is consistent with metastasis from the patient's known gynecologic carcinoma.   02/01/2018 Procedure   Ultrasound-guided core biopsies of a suspicious right axillary lymph node.   02/21/2018 - 06/13/2018 Chemotherapy   The patient had carboplatin & Taxol x 6   03/26/2018 - 04/30/2018 Radiation Therapy   Radiation treatment dates:   03/26/18, 04/02/18, 04/19/18, 04/23/18 04/30/18  Site/dose: proximal Vagina, 6 Gy in 5 fractions for a total dose of 30 Gy    04/26/2018 PET scan   1. Interval resection of the hypermetabolic endometrial primary. No discernible hypermetabolic pelvic sidewall or peritoneal metastases. 2. Stable hypermetabolic bilateral axillary lymphadenopathy. The degree of hypermetabolism in these lymph nodes remains highly suspicious for neoplasm. 3. Interval development of low level FDG uptake in 2 stable, small lymph nodes in the left groin region. This may be reactive, but close attention recommended to exclude metastatic involvement. 4. Stable non hypermetabolic low-density liver lesions and the mixed lucent and sclerotic T12 lesion is stable without hypermetabolism today.   07/09/2018 Imaging   Status post hysterectomy.  Mild axillary lymphadenopathy, improved. Nodal metastases not excluded.  Irregular wall thickening involving the anterior bladder, possibly reflecting radiation cystitis versus tumor.  Additional stable findings as above, including a 5 mm right middle lobe nodule and stable sclerosis involving the T12 vertebral body.   10/10/2018 Imaging   1. Stable exam. No findings within the abdomen or pelvis to suggest metastatic disease. 2. Unchanged appearance of sclerosis  involving the T12 vertebra. 3.  Aortic Atherosclerosis (ICD10-I70.0).   10/10/2018 Imaging   Ct abdomen and pelvis 1. Stable exam. No findings within the abdomen or pelvis to suggest metastatic disease. 2. Unchanged appearance of sclerosis involving the T12 vertebra. 3.  Aortic Atherosclerosis (ICD10-I70.0).   01/16/2019 PET scan   1. Enlargement and increased activity in the left axillary, right axillary, and left inguinal adenopathy compatible with progressive malignancy. 2. Stable 4 mm right middle lobe subpleural nodule, not appreciably hypermetabolic. 3. Other imaging findings of potential clinical significance: Right kidney lower pole cyst. Aortic Atherosclerosis (ICD10-I70.0). Prominent stool throughout the colon favors constipation. Mild chronic left maxillary sinusitis.     02/01/2019 -  Chemotherapy   The patient had pembrolizumab and Lenvima for chemotherapy treatment.     04/25/2019 Imaging   Ct imaging 1. Interval response to therapy. Decrease in size of bilateral axillary and left inguinal lymph nodes. No new or progressive findings identified. 2. Stable sclerotic appearance of the T12 vertebra. 3. Aortic atherosclerosis. Lad coronary artery calcification noted.   08/30/2019 Imaging   1. Bulky RIGHT hilar lymph node, slightly increased in size from previous imaging. 2. Multiple foci of hyperenhancement in the liver. 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 Consider MRI liver on follow-up to establish a baseline for number of lesions as these will appear variable on CT follow-up based on phase of contrast acquisition in the future. 3. Stable 5 mm nodule adjacent to the fissure, minor fissure in the RIGHT  middle lobe.       REVIEW OF SYSTEMS:   Constitutional: Denies fevers,  chills or abnormal weight loss Eyes: Denies blurriness of vision Ears, nose, mouth, throat, and face: Denies mucositis or sore throat Respiratory: Denies cough, dyspnea or wheezes Cardiovascular: Denies palpitation, chest discomfort or lower extremity swelling Gastrointestinal:  Denies nausea, heartburn or change in bowel habits Skin: Denies abnormal skin rashes Lymphatics: Denies new lymphadenopathy or easy bruising Neurological:Denies numbness, tingling or new weaknesses Behavioral/Psych: Mood is stable, no new changes  All other systems were reviewed with the patient and are negative.  I have reviewed the past medical history, past surgical history, social history and family history with the patient and they are unchanged from previous note.  ALLERGIES:  is allergic to sulfa antibiotics and sulfamethoxazole.  MEDICATIONS:  Current Outpatient Medications  Medication Sig Dispense Refill  . ketoconazole (NIZORAL) 2 % cream Apply to web space twice a day for 1 week then daily for 1 month (Patient not taking: Reported on 09/27/2019) 15 g 0  . lenvatinib 10 mg daily dose (LENVIMA, 10 MG DAILY DOSE,) capsule Take 1 capsule (10 mg total) by mouth daily. 30 capsule 11  . levothyroxine (SYNTHROID) 75 MCG tablet Take 1 tablet (75 mcg total) by mouth daily before breakfast. 30 tablet 11  . lidocaine-prilocaine (EMLA) cream Apply to affected area once 30 g 3  . lisinopril-hydrochlorothiazide (PRINZIDE,ZESTORETIC) 20-12.5 MG tablet Take 1 tablet by mouth daily. (Patient taking differently: Take 1 tablet by mouth every morning. ) 30 tablet 11  . NOVOLIN 70/30 FLEXPEN (70-30) 100 UNIT/ML KwikPen INJECT 22 UNITS SUBCUTANEOUSLY ONCE DAILY WITH BREAKFAST 15 mL 0  . ondansetron (ZOFRAN) 8 MG tablet Take 1 tablet (8 mg total) by mouth daily. (Patient not taking: Reported on 09/27/2019) 30 tablet 1  . OneTouch Delica Lancets 04N MISC 1 each by Other route 2 (two) times daily. Use to monitor glucose levels BID;  E11.65 100 each 2  . ONETOUCH VERIO test strip 1 each by Other route 3 (three) times daily. And lancets 3/day 300 each 3  . RELION PEN NEEDLES 32G X 4 MM MISC USE AS DIRECTED 50 each 0  . simvastatin (ZOCOR) 10 MG tablet Take 10 mg by mouth at bedtime.     . Vitamin D, Ergocalciferol, 2000 units CAPS Take 2,000 Units by mouth daily.   0   No current facility-administered medications for this visit.    PHYSICAL EXAMINATION: ECOG PERFORMANCE STATUS: 1 - Symptomatic but completely ambulatory  Vitals:   10/18/19 0825  BP: 113/72  Pulse: 86  Resp: 18  Temp: 97.8 F (36.6 C)  SpO2: 100%   Filed Weights   10/18/19 0825  Weight: 188 lb 3.2 oz (85.4 kg)    GENERAL:alert, no distress and comfortable SKIN: skin color, texture, turgor are normal, no rashes or significant lesions EYES: normal, Conjunctiva are pink and non-injected, sclera clear OROPHARYNX:no exudate, no erythema and lips, buccal mucosa, and tongue normal  NECK: supple, thyroid normal size, non-tender, without nodularity LYMPH:  no palpable lymphadenopathy in the cervical, axillary or inguinal LUNGS: clear to auscultation and percussion with normal breathing effort HEART: regular rate & rhythm and no murmurs and no lower extremity edema ABDOMEN:abdomen soft, non-tender and normal bowel sounds Musculoskeletal:no cyanosis of digits and no clubbing  NEURO: alert & oriented x 3 with fluent speech, no focal motor/sensory deficits  LABORATORY DATA:  I have reviewed the data as listed    Component Value Date/Time  NA 141 10/18/2019 0811   K 3.7 10/18/2019 0811   CL 110 10/18/2019 0811   CO2 21 (L) 10/18/2019 0811   GLUCOSE 121 (H) 10/18/2019 0811   BUN 40 (H) 10/18/2019 0811   CREATININE 1.94 (H) 10/18/2019 0811   CALCIUM 9.7 10/18/2019 0811   PROT 6.9 10/18/2019 0811   ALBUMIN 3.6 10/18/2019 0811   AST 14 (L) 10/18/2019 0811   ALT 13 10/18/2019 0811   ALKPHOS 73 10/18/2019 0811   BILITOT 0.4 10/18/2019 0811    GFRNONAA 26 (L) 10/18/2019 0811   GFRAA 30 (L) 10/18/2019 0811    No results found for: SPEP, UPEP  Lab Results  Component Value Date   WBC 3.5 (L) 10/18/2019   NEUTROABS 2.0 10/18/2019   HGB 11.7 (L) 10/18/2019   HCT 35.4 (L) 10/18/2019   MCV 88.3 10/18/2019   PLT 176 10/18/2019      Chemistry      Component Value Date/Time   NA 141 10/18/2019 0811   K 3.7 10/18/2019 0811   CL 110 10/18/2019 0811   CO2 21 (L) 10/18/2019 0811   BUN 40 (H) 10/18/2019 0811   CREATININE 1.94 (H) 10/18/2019 0811      Component Value Date/Time   CALCIUM 9.7 10/18/2019 0811   ALKPHOS 73 10/18/2019 0811   AST 14 (L) 10/18/2019 0811   ALT 13 10/18/2019 0811   BILITOT 0.4 10/18/2019 1740

## 2019-10-18 NOTE — Telephone Encounter (Signed)
Please refer to BOTH concerns documented below and advise

## 2019-10-18 NOTE — Telephone Encounter (Signed)
Patient called stating she just got an infusion and they did labs and her creatinine level is high and wanted to see Dr Loanne Drilling sooner than 7/14 to discuss but he has nothing sooner - patient would like to be called to discuss this and thinks she should stop taking the lisinopril or change dosage. Ph# [phone]  Here are her levels:  10/18/19 - 1.94 09/27/19 - 1.50 09/02/19 - 1.45

## 2019-10-18 NOTE — Assessment & Plan Note (Signed)
She has acquired hypothyroidism secondary to side effects of Keytruda We will be monitoring her TSH carefully and adjust the dose of Synthroid as needed 

## 2019-10-18 NOTE — Telephone Encounter (Signed)
Scheduled appts per 7/2 sch msg. Pt declined print out of AVS.

## 2019-10-18 NOTE — Telephone Encounter (Signed)
This should be addressed by PCP

## 2019-10-18 NOTE — Assessment & Plan Note (Signed)
She has acute on chronic renal failure today Recommend her to hold diuretic therapy and to consult with primary care doctor for medical management She is advised to drink plenty of fluids

## 2019-10-18 NOTE — Telephone Encounter (Signed)
Patient called back to state that she forget to say in previous note that Dr. Alvy Bimler is concerned about the dieretic that is in the Lisinopril that patient takes. Patient states her blood pressure is fine but is concerned about her kidneys. Patient requests to be called at ph# [phone] to discuss.

## 2019-10-18 NOTE — Telephone Encounter (Signed)
Called pt and informed about Dr. Cordelia Pen response below. Pt states she will call Dr. Stephanie Acre to make her aware. Expressed appreciation for the call.

## 2019-10-18 NOTE — Assessment & Plan Note (Signed)
Her blood pressure control is satisfactory

## 2019-10-18 NOTE — Patient Instructions (Signed)
[facility] Discharge Instructions for Patients Receiving Chemotherapy  Today you received the following immunotherapy agent: Keytruda  To help prevent nausea and vomiting after your treatment, we encourage you to take your nausea medication as directed by your MD.   If you develop nausea and vomiting that is not controlled by your nausea medication, call the clinic.   BELOW ARE SYMPTOMS THAT SHOULD BE REPORTED IMMEDIATELY:  *FEVER GREATER THAN 100.5 F  *CHILLS WITH OR WITHOUT FEVER  NAUSEA AND VOMITING THAT IS NOT CONTROLLED WITH YOUR NAUSEA MEDICATION  *UNUSUAL SHORTNESS OF BREATH  *UNUSUAL BRUISING OR BLEEDING  TENDERNESS IN MOUTH AND THROAT WITH OR WITHOUT PRESENCE OF ULCERS  *URINARY PROBLEMS  *BOWEL PROBLEMS  UNUSUAL RASH Items with * indicate a potential emergency and should be followed up as soon as possible.  Feel free to call the clinic should you have any questions or concerns. The clinic phone number is [phone].  Please show the CHEMO ALERT CARD at check-in to the Emergency Department and triage nurse.   

## 2019-10-18 NOTE — Assessment & Plan Note (Signed)
She has stable disease overall She tolerated treatment very well We will continue treatment indefinitely Her next imaging study will be due in August

## 2019-10-19 LAB — T4: T4, Total: 8.6 ug/dL (ref 4.5–12.0)

## 2019-10-30 ENCOUNTER — Ambulatory Visit (INDEPENDENT_AMBULATORY_CARE_PROVIDER_SITE_OTHER): Payer: Medicare Other | Admitting: Endocrinology

## 2019-10-30 ENCOUNTER — Other Ambulatory Visit: Payer: Self-pay

## 2019-10-30 ENCOUNTER — Telehealth: Payer: Self-pay | Admitting: Hematology and Oncology

## 2019-10-30 VITALS — BP 136/82 | HR 100 | Ht 66.0 in | Wt 190.0 lb

## 2019-10-30 DIAGNOSIS — N1831 Chronic kidney disease, stage 3a: Secondary | ICD-10-CM

## 2019-10-30 DIAGNOSIS — E213 Hyperparathyroidism, unspecified: Secondary | ICD-10-CM | POA: Diagnosis not present

## 2019-10-30 DIAGNOSIS — E1121 Type 2 diabetes mellitus with diabetic nephropathy: Secondary | ICD-10-CM

## 2019-10-30 DIAGNOSIS — Z794 Long term (current) use of insulin: Secondary | ICD-10-CM

## 2019-10-30 LAB — POCT GLYCOSYLATED HEMOGLOBIN (HGB A1C): Hemoglobin A1C: 6 % — AB (ref 4.0–5.6)

## 2019-10-30 MED ORDER — NOVOLIN 70/30 FLEXPEN (70-30) 100 UNIT/ML ~~LOC~~ SUPN: 18.0000 [IU] | PEN_INJECTOR | Freq: Every day | SUBCUTANEOUS | 0 refills | Status: DC

## 2019-10-30 NOTE — Telephone Encounter (Signed)
Release: [phone] Faxed medical records to [facility] @ [place] @ [phone]

## 2019-10-30 NOTE — Patient Instructions (Addendum)
check your blood sugar 3 times a day.  vary the time of day when you check, between before the 3 meals, and at bedtime.  also check if you have symptoms of your blood sugar being too high or too low.  please keep a record of the readings and bring it to your next appointment here (or you can bring the meter itself).  You can write it on any piece of paper.  please call us sooner if your blood sugar goes below 70, or if you have a lot of readings over 200. On this type of insulin schedule, you should eat meals on a regular schedule (especially lunch).  If a meal is missed or significantly delayed, your blood sugar could go low.   Please reduce the insulin to 18 units with breakfast. Blood tests are requested for you today.  We'll let you know about the results.  Please come back for a follow-up appointment in 3 months.

## 2019-10-30 NOTE — Progress Notes (Signed)
Subjective:    Patient ID: Krista Johnson, female    DOB: 02-15-1951, 69 y.o.   MRN: [id number]  HPI Pt returns for f/u of diabetes mellitus:  DM type: Insulin-requiring type 2.   Dx'ed: 2094 Complications: stage 3b CRI Therapy: insulin since mid-2019 GDM: never (G0) DKA: never Severe hypoglycemia: never Pancreatitis: never Pancreatic imaging: normal on 2019 CT.   Other: she takes qd insulin, after poor results with multiple daily injections; She is finished with her chemo; she takes NPH, then 70/30, due to the pattern of her cbg's.  Interval history:she brings her meter with her cbg's which I have reviewed today.  cbg varies from 58-109.  There is no trend throughout the day.    Pt also has vit-D def and Hyperparathyroidism (prob a combination of primary and secondary).  She takes vit-D, 2000 units/day.  No recent steroids.   Past Medical History:  Diagnosis Date  . Arthritis    right knee--- last cortisone injection 04/ 2019  . CKD (chronic kidney disease)   . Diabetes mellitus without complication [facility])    endocrinologist-  dr Loanne Drilling  . Endometrial cancer ([place])   . History of colon polyps   . Hyperlipidemia   . Hyperparathyroidism ([place])   . Hypertension     Past Surgical History:  Procedure Laterality Date  . COLONOSCOPY  last one 12-25-2017  . IR IMAGING GUIDED PORT INSERTION  02/20/2018  . ROBOTIC ASSISTED TOTAL HYSTERECTOMY WITH BILATERAL SALPINGO OOPHERECTOMY N/A 01/23/2018   Procedure: XI ROBOTIC ASSISTED TOTAL HYSTERECTOMY WITH BILATERAL SALPINGO OOPHORECTOMY;  Surgeon: Everitt Amber, MD;  Location: [facility] ORS;  Service: Gynecology;  Laterality: N/A;  . SENTINEL NODE BIOPSY N/A 01/23/2018   Procedure: SENTINEL NODE BIOPSY;  Surgeon: Everitt Amber, MD;  Location: [facility];  Service: Gynecology;  Laterality: N/A;    Social History   Socioeconomic History  . Marital status: Single    Spouse name: Not on file  . Number of children: 0  . Years of education: Not on file  .  Highest education level: Not on file  Occupational History  . Occupation: retired Education officer, museum  Tobacco Use  . Smoking status: Never Smoker  . Smokeless tobacco: Never Used  Vaping Use  . Vaping Use: Never used  Substance and Sexual Activity  . Alcohol use: Not Currently    Alcohol/week: 0.0 standard drinks  . Drug use: Never  . Sexual activity: Not on file  Other Topics Concern  . Not on file  Social History Narrative  . Not on file   Social Determinants of Health   Financial Resource Strain:   . Difficulty of Paying Living Expenses:   Food Insecurity:   . Worried About Charity fundraiser in the Last Year:   . Arboriculturist in the Last Year:   Transportation Needs:   . Film/video editor (Medical):   Marland Kitchen Lack of Transportation (Non-Medical):   Physical Activity:   . Days of Exercise per Week:   . Minutes of Exercise per Session:   Stress:   . Feeling of Stress :   Social Connections:   . Frequency of Communication with Friends and Family:   . Frequency of Social Gatherings with Friends and Family:   . Attends Religious Services:   . Active Member of Clubs or Organizations:   . Attends Archivist Meetings:   Marland Kitchen Marital Status:   Intimate Partner Violence:   . Fear of Current or Ex-Partner:   .  Emotionally Abused:   Marland Kitchen Physically Abused:   . Sexually Abused:     Current Outpatient Medications on File Prior to Visit  Medication Sig Dispense Refill  . ketoconazole (NIZORAL) 2 % cream Apply to web space twice a day for 1 week then daily for 1 month 15 g 0  . lenvatinib 10 mg daily dose (LENVIMA, 10 MG DAILY DOSE,) capsule Take 1 capsule (10 mg total) by mouth daily. 30 capsule 11  . levothyroxine (SYNTHROID) 75 MCG tablet Take 1 tablet (75 mcg total) by mouth daily before breakfast. 30 tablet 11  . lidocaine-prilocaine (EMLA) cream Apply to affected area once 30 g 3  . lisinopril-hydrochlorothiazide (PRINZIDE,ZESTORETIC) 20-12.5 MG tablet Take 1 tablet  by mouth daily. (Patient taking differently: Take 1 tablet by mouth every morning. ) 30 tablet 11  . ondansetron (ZOFRAN) 8 MG tablet Take 1 tablet (8 mg total) by mouth daily. 30 tablet 1  . OneTouch Delica Lancets 89Q MISC 1 each by Other route 2 (two) times daily. Use to monitor glucose levels BID; E11.65 100 each 2  . ONETOUCH VERIO test strip 1 each by Other route 3 (three) times daily. And lancets 3/day 300 each 3  . RELION PEN NEEDLES 32G X 4 MM MISC USE AS DIRECTED 50 each 0  . simvastatin (ZOCOR) 10 MG tablet Take 10 mg by mouth at bedtime.     . Vitamin D, Ergocalciferol, 2000 units CAPS Take 2,000 Units by mouth daily.   0   No current facility-administered medications on file prior to visit.    Allergies  Allergen Reactions  . Sulfa Antibiotics Nausea Only  . Sulfamethoxazole Nausea Only    Family History  Problem Relation Age of Onset  . Diabetes Mother   . Hypertension Mother   . Diabetes Sister   . Hypertension Sister   . Diabetes Maternal Uncle   . Colon cancer Paternal Aunt   . Diabetes Paternal Aunt   . Stomach cancer Neg Hx   . Rectal cancer Neg Hx   . Esophageal cancer Neg Hx   . Colon polyps Neg Hx     BP 136/82   Pulse 100   Ht 5\' 6"  (1.676 m)   Wt 190 lb (86.2 kg)   SpO2 99%   BMI 30.67 kg/m    Review of Systems Denies LOC    Objective:   Physical Exam VITAL SIGNS:  See vs page GENERAL: no distress Pulses: dorsalis pedis intact bilat.   MSK: no deformity of the feet CV: no leg edema.   Skin:  no ulcer on the feet.  normal color and temp on the feet.   Neuro: sensation is intact to touch on the feet.    Lab Results  Component Value Date   HGBA1C 6.0 (A) 10/30/2019   Lab Results  Component Value Date   CREATININE 1.94 (H) 10/18/2019   BUN 40 (H) 10/18/2019   NA 141 10/18/2019   K 3.7 10/18/2019   CL 110 10/18/2019   CO2 21 (L) 10/18/2019        Assessment & Plan:  Insulin-requiring type 2 DM, with stage 3b CRI.    Hypoglycemia, due to insulin: this limits aggressiveness of glycemic control.    Patient Instructions  check your blood sugar 3 times a day.  vary the time of day when you check, between before the 3 meals, and at bedtime.  also check if you have symptoms of your blood sugar being too high or  too low.  please keep a record of the readings and bring it to your next appointment here (or you can bring the meter itself).  You can write it on any piece of paper.  please call us sooner if your blood sugar goes below 70, or if you have a lot of readings over 200. On this type of insulin schedule, you should eat meals on a regular schedule (especially lunch).  If a meal is missed or significantly delayed, your blood sugar could go low.   Please reduce the insulin to 18 units with breakfast. Blood tests are requested for you today.  We'll let you know about the results.  Please come back for a follow-up appointment in 3 months.

## 2019-10-31 LAB — VITAMIN D 25 HYDROXY (VIT D DEFICIENCY, FRACTURES): VITD: 34.11 ng/mL (ref 30.00–100.00)

## 2019-11-03 LAB — FRUCTOSAMINE: Fructosamine: 234 umol/L (ref 205–285)

## 2019-11-03 LAB — PTH, INTACT AND CALCIUM
Calcium: 9.8 mg/dL (ref 8.6–10.4)
PTH: 219 pg/mL — ABNORMAL HIGH (ref 14–64)

## 2019-11-08 ENCOUNTER — Inpatient Hospital Stay: Payer: Medicare Other

## 2019-11-08 ENCOUNTER — Other Ambulatory Visit: Payer: Self-pay

## 2019-11-08 VITALS — BP 112/75 | HR 83 | Temp 97.8°F | Resp 18

## 2019-11-08 DIAGNOSIS — N183 Chronic kidney disease, stage 3 unspecified: Secondary | ICD-10-CM | POA: Diagnosis not present

## 2019-11-08 DIAGNOSIS — Z9221 Personal history of antineoplastic chemotherapy: Secondary | ICD-10-CM | POA: Diagnosis not present

## 2019-11-08 DIAGNOSIS — Z923 Personal history of irradiation: Secondary | ICD-10-CM | POA: Diagnosis not present

## 2019-11-08 DIAGNOSIS — Z7189 Other specified counseling: Secondary | ICD-10-CM

## 2019-11-08 DIAGNOSIS — C773 Secondary and unspecified malignant neoplasm of axilla and upper limb lymph nodes: Secondary | ICD-10-CM

## 2019-11-08 DIAGNOSIS — Z5112 Encounter for antineoplastic immunotherapy: Secondary | ICD-10-CM | POA: Diagnosis not present

## 2019-11-08 DIAGNOSIS — C541 Malignant neoplasm of endometrium: Secondary | ICD-10-CM

## 2019-11-08 DIAGNOSIS — I129 Hypertensive chronic kidney disease with stage 1 through stage 4 chronic kidney disease, or unspecified chronic kidney disease: Secondary | ICD-10-CM | POA: Diagnosis not present

## 2019-11-08 LAB — CBC WITH DIFFERENTIAL (CANCER CENTER ONLY)
Abs Immature Granulocytes: 0.01 10*3/uL (ref 0.00–0.07)
Basophils Absolute: 0 10*3/uL (ref 0.0–0.1)
Basophils Relative: 0 %
Eosinophils Absolute: 0.1 10*3/uL (ref 0.0–0.5)
Eosinophils Relative: 1 %
HCT: 35.8 % — ABNORMAL LOW (ref 36.0–46.0)
Hemoglobin: 11.5 g/dL — ABNORMAL LOW (ref 12.0–15.0)
Immature Granulocytes: 0 %
Lymphocytes Relative: 33 %
Lymphs Abs: 1.5 10*3/uL (ref 0.7–4.0)
MCH: 28.4 pg (ref 26.0–34.0)
MCHC: 32.1 g/dL (ref 30.0–36.0)
MCV: 88.4 fL (ref 80.0–100.0)
Monocytes Absolute: 0.3 10*3/uL (ref 0.1–1.0)
Monocytes Relative: 6 %
Neutro Abs: 2.6 10*3/uL (ref 1.7–7.7)
Neutrophils Relative %: 60 %
Platelet Count: 208 10*3/uL (ref 150–400)
RBC: 4.05 MIL/uL (ref 3.87–5.11)
RDW: 13.2 % (ref 11.5–15.5)
WBC Count: 4.5 10*3/uL (ref 4.0–10.5)
nRBC: 0 % (ref 0.0–0.2)

## 2019-11-08 LAB — CMP (CANCER CENTER ONLY)
ALT: 11 U/L (ref 0–44)
AST: 16 U/L (ref 15–41)
Albumin: 3.6 g/dL (ref 3.5–5.0)
Alkaline Phosphatase: 74 U/L (ref 38–126)
Anion gap: 12 (ref 5–15)
BUN: 36 mg/dL — ABNORMAL HIGH (ref 8–23)
CO2: 20 mmol/L — ABNORMAL LOW (ref 22–32)
Calcium: 10.3 mg/dL (ref 8.9–10.3)
Chloride: 110 mmol/L (ref 98–111)
Creatinine: 1.88 mg/dL — ABNORMAL HIGH (ref 0.44–1.00)
GFR, Est AFR Am: 31 mL/min — ABNORMAL LOW (ref >60)
GFR, Estimated: 27 mL/min — ABNORMAL LOW (ref >60)
Glucose, Bld: 130 mg/dL — ABNORMAL HIGH (ref 70–99)
Potassium: 4 mmol/L (ref 3.5–5.1)
Sodium: 142 mmol/L (ref 135–145)
Total Bilirubin: 0.5 mg/dL (ref 0.3–1.2)
Total Protein: 7 g/dL (ref 6.5–8.1)

## 2019-11-08 LAB — TSH: TSH: 0.976 u[IU]/mL (ref 0.308–3.960)

## 2019-11-08 MED ORDER — SODIUM CHLORIDE 0.9% FLUSH
10.0000 mL | INTRAVENOUS | Status: DC | PRN
  Administered 2019-11-08: 10 mL
  Filled 2019-11-08: qty 10

## 2019-11-08 MED ORDER — SODIUM CHLORIDE 0.9 % IV SOLN
Freq: Once | INTRAVENOUS | Status: AC
  Filled 2019-11-08: qty 250

## 2019-11-08 MED ORDER — SODIUM CHLORIDE 0.9% FLUSH
10.0000 mL | Freq: Once | INTRAVENOUS | Status: AC
  Administered 2019-11-08: 10 mL
  Filled 2019-11-08: qty 10

## 2019-11-08 MED ORDER — SODIUM CHLORIDE 0.9 % IV SOLN
200.0000 mg | Freq: Once | INTRAVENOUS | Status: AC
  Administered 2019-11-08: 200 mg via INTRAVENOUS
  Filled 2019-11-08: qty 8

## 2019-11-08 MED ORDER — HEPARIN SOD (PORK) LOCK FLUSH 100 UNIT/ML IV SOLN
500.0000 [IU] | Freq: Once | INTRAVENOUS | Status: AC | PRN
  Administered 2019-11-08: 500 [IU]
  Filled 2019-11-08: qty 5

## 2019-11-08 NOTE — Progress Notes (Signed)
Per Dr.Gorsuch, OK to treat with elevated creatinine  

## 2019-11-08 NOTE — Patient Instructions (Signed)
[facility] Discharge Instructions for Patients Receiving Chemotherapy  Today you received the following chemotherapy agents:  Keytruda.  To help prevent nausea and vomiting after your treatment, we encourage you to take your nausea medication as directed.   If you develop nausea and vomiting that is not controlled by your nausea medication, call the clinic.   BELOW ARE SYMPTOMS THAT SHOULD BE REPORTED IMMEDIATELY:  *FEVER GREATER THAN 100.5 F  *CHILLS WITH OR WITHOUT FEVER  NAUSEA AND VOMITING THAT IS NOT CONTROLLED WITH YOUR NAUSEA MEDICATION  *UNUSUAL SHORTNESS OF BREATH  *UNUSUAL BRUISING OR BLEEDING  TENDERNESS IN MOUTH AND THROAT WITH OR WITHOUT PRESENCE OF ULCERS  *URINARY PROBLEMS  *BOWEL PROBLEMS  UNUSUAL RASH Items with * indicate a potential emergency and should be followed up as soon as possible.  Feel free to call the clinic should you have any questions or concerns. The clinic phone number is [phone].  Please show the CHEMO ALERT CARD at check-in to the Emergency Department and triage nurse.    

## 2019-11-09 LAB — T4: T4, Total: 8.5 ug/dL (ref 4.5–12.0)

## 2019-11-28 ENCOUNTER — Ambulatory Visit (HOSPITAL_COMMUNITY)
Admission: RE | Admit: 2019-11-28 | Discharge: 2019-11-28 | Disposition: A | Discharge status: Home / Self Care | Payer: Medicare Other | Source: Ambulatory Visit | Attending: Hematology and Oncology | Admitting: Hematology and Oncology

## 2019-11-28 ENCOUNTER — Other Ambulatory Visit: Payer: Self-pay | Admitting: Hematology and Oncology

## 2019-11-28 ENCOUNTER — Inpatient Hospital Stay: Payer: Medicare Other

## 2019-11-28 ENCOUNTER — Inpatient Hospital Stay: Payer: Medicare Other | Attending: Gynecology

## 2019-11-28 ENCOUNTER — Other Ambulatory Visit: Payer: Self-pay

## 2019-11-28 DIAGNOSIS — I700 Atherosclerosis of aorta: Secondary | ICD-10-CM | POA: Diagnosis not present

## 2019-11-28 DIAGNOSIS — C541 Malignant neoplasm of endometrium: Secondary | ICD-10-CM | POA: Diagnosis not present

## 2019-11-28 DIAGNOSIS — Z794 Long term (current) use of insulin: Secondary | ICD-10-CM | POA: Diagnosis not present

## 2019-11-28 DIAGNOSIS — R918 Other nonspecific abnormal finding of lung field: Secondary | ICD-10-CM | POA: Diagnosis not present

## 2019-11-28 DIAGNOSIS — Z9221 Personal history of antineoplastic chemotherapy: Secondary | ICD-10-CM | POA: Insufficient documentation

## 2019-11-28 DIAGNOSIS — K7689 Other specified diseases of liver: Secondary | ICD-10-CM | POA: Diagnosis not present

## 2019-11-28 DIAGNOSIS — Z923 Personal history of irradiation: Secondary | ICD-10-CM | POA: Diagnosis not present

## 2019-11-28 DIAGNOSIS — Z79899 Other long term (current) drug therapy: Secondary | ICD-10-CM | POA: Diagnosis not present

## 2019-11-28 DIAGNOSIS — N281 Cyst of kidney, acquired: Secondary | ICD-10-CM | POA: Diagnosis not present

## 2019-11-28 DIAGNOSIS — Z5112 Encounter for antineoplastic immunotherapy: Secondary | ICD-10-CM | POA: Diagnosis not present

## 2019-11-28 DIAGNOSIS — C779 Secondary and unspecified malignant neoplasm of lymph node, unspecified: Secondary | ICD-10-CM | POA: Diagnosis not present

## 2019-11-28 DIAGNOSIS — R591 Generalized enlarged lymph nodes: Secondary | ICD-10-CM | POA: Insufficient documentation

## 2019-11-28 DIAGNOSIS — C773 Secondary and unspecified malignant neoplasm of axilla and upper limb lymph nodes: Secondary | ICD-10-CM

## 2019-11-28 DIAGNOSIS — Z7189 Other specified counseling: Secondary | ICD-10-CM

## 2019-11-28 DIAGNOSIS — I2510 Atherosclerotic heart disease of native coronary artery without angina pectoris: Secondary | ICD-10-CM | POA: Diagnosis not present

## 2019-11-28 DIAGNOSIS — C539 Malignant neoplasm of cervix uteri, unspecified: Secondary | ICD-10-CM | POA: Diagnosis not present

## 2019-11-28 LAB — CMP (CANCER CENTER ONLY)
ALT: 8 U/L (ref 0–44)
AST: 13 U/L — ABNORMAL LOW (ref 15–41)
Albumin: 3.5 g/dL (ref 3.5–5.0)
Alkaline Phosphatase: 77 U/L (ref 38–126)
Anion gap: 11 (ref 5–15)
BUN: 46 mg/dL — ABNORMAL HIGH (ref 8–23)
CO2: 19 mmol/L — ABNORMAL LOW (ref 22–32)
Calcium: 10.1 mg/dL (ref 8.9–10.3)
Chloride: 112 mmol/L — ABNORMAL HIGH (ref 98–111)
Creatinine: 1.93 mg/dL — ABNORMAL HIGH (ref 0.44–1.00)
GFR, Est AFR Am: 30 mL/min — ABNORMAL LOW (ref >60)
GFR, Estimated: 26 mL/min — ABNORMAL LOW (ref >60)
Glucose, Bld: 98 mg/dL (ref 70–99)
Potassium: 4 mmol/L (ref 3.5–5.1)
Sodium: 142 mmol/L (ref 135–145)
Total Bilirubin: 0.5 mg/dL (ref 0.3–1.2)
Total Protein: 6.8 g/dL (ref 6.5–8.1)

## 2019-11-28 LAB — CBC WITH DIFFERENTIAL (CANCER CENTER ONLY)
Abs Immature Granulocytes: 0.01 10*3/uL (ref 0.00–0.07)
Basophils Absolute: 0 10*3/uL (ref 0.0–0.1)
Basophils Relative: 0 %
Eosinophils Absolute: 0 10*3/uL (ref 0.0–0.5)
Eosinophils Relative: 1 %
HCT: 34.1 % — ABNORMAL LOW (ref 36.0–46.0)
Hemoglobin: 11.1 g/dL — ABNORMAL LOW (ref 12.0–15.0)
Immature Granulocytes: 0 %
Lymphocytes Relative: 36 %
Lymphs Abs: 1.5 10*3/uL (ref 0.7–4.0)
MCH: 28.2 pg (ref 26.0–34.0)
MCHC: 32.6 g/dL (ref 30.0–36.0)
MCV: 86.8 fL (ref 80.0–100.0)
Monocytes Absolute: 0.3 10*3/uL (ref 0.1–1.0)
Monocytes Relative: 6 %
Neutro Abs: 2.3 10*3/uL (ref 1.7–7.7)
Neutrophils Relative %: 57 %
Platelet Count: 181 10*3/uL (ref 150–400)
RBC: 3.93 MIL/uL (ref 3.87–5.11)
RDW: 13.5 % (ref 11.5–15.5)
WBC Count: 4.2 10*3/uL (ref 4.0–10.5)
nRBC: 0 % (ref 0.0–0.2)

## 2019-11-28 LAB — TSH: TSH: 0.808 u[IU]/mL (ref 0.308–3.960)

## 2019-11-28 MED ORDER — SODIUM CHLORIDE (PF) 0.9 % IJ SOLN
INTRAMUSCULAR | Status: AC
  Filled 2019-11-28: qty 50

## 2019-11-28 MED ORDER — SODIUM CHLORIDE 0.9% FLUSH
10.0000 mL | Freq: Once | INTRAVENOUS | Status: AC
  Administered 2019-11-28: 10 mL
  Filled 2019-11-28: qty 10

## 2019-11-28 MED ORDER — HEPARIN SOD (PORK) LOCK FLUSH 100 UNIT/ML IV SOLN
500.0000 [IU] | Freq: Once | INTRAVENOUS | Status: AC
  Administered 2019-11-28: 500 [IU] via INTRAVENOUS

## 2019-11-28 MED ORDER — HEPARIN SOD (PORK) LOCK FLUSH 100 UNIT/ML IV SOLN
INTRAVENOUS | Status: AC
  Filled 2019-11-28: qty 5

## 2019-11-28 MED ORDER — IOHEXOL 300 MG/ML  SOLN: 100.0000 mL | Freq: Once | INTRAMUSCULAR | Status: DC | PRN

## 2019-11-28 NOTE — Patient Instructions (Signed)
Implanted Port Home Guide °An implanted port is a device that is placed under the skin. It is usually placed in the chest. The device can be used to give IV medicine, to take blood, or for dialysis. You may have an implanted port if: °· You need IV medicine that would be irritating to the small veins in your hands or arms. °· You need IV medicines, such as antibiotics, for a long period of time. °· You need IV nutrition for a long period of time. °· You need dialysis. °Having a port means that your health care provider will not need to use the veins in your arms for these procedures. You may have fewer limitations when using a port than you would if you used other types of long-term IVs, and you will likely be able to return to normal activities after your incision heals. °An implanted port has two main parts: °· Reservoir. The reservoir is the part where a needle is inserted to give medicines or draw blood. The reservoir is round. After it is placed, it appears as a small, raised area under your skin. °· Catheter. The catheter is a thin, flexible tube that connects the reservoir to a vein. Medicine that is inserted into the reservoir goes into the catheter and then into the vein. °How is my port accessed? °To access your port: °· A numbing cream may be placed on the skin over the port site. °· Your health care provider will put on a mask and sterile gloves. °· The skin over your port will be cleaned carefully with a germ-killing soap and allowed to dry. °· Your health care provider will gently pinch the port and insert a needle into it. °· Your health care provider will check for a blood return to make sure the port is in the vein and is not clogged. °· If your port needs to remain accessed to get medicine continuously (constant infusion), your health care provider will place a clear bandage (dressing) over the needle site. The dressing and needle will need to be changed every week, or as told by your health care  provider. °What is flushing? °Flushing helps keep the port from getting clogged. Follow instructions from your health care provider about how and when to flush the port. Ports are usually flushed with saline solution or a medicine called heparin. The need for flushing will depend on how the port is used: °· If the port is only used from time to time to give medicines or draw blood, the port may need to be flushed: °? Before and after medicines have been given. °? Before and after blood has been drawn. °? As part of routine maintenance. Flushing may be recommended every 4-6 weeks. °· If a constant infusion is running, the port may not need to be flushed. °· Throw away any syringes in a disposal container that is meant for sharp items (sharps container). You can buy a sharps container from a pharmacy, or you can make one by using an empty hard plastic bottle with a cover. °How long will my port stay implanted? °The port can stay in for as long as your health care provider thinks it is needed. When it is time for the port to come out, a surgery will be done to remove it. The surgery will be similar to the procedure that was done to put the port in. °Follow these instructions at home: ° °· Flush your port as told by your health care provider. °·   If you need an infusion over several days, follow instructions from your health care provider about how to take care of your port site. Make sure you: ? Wash your hands with soap and water before you change your dressing. If soap and water are not available, use alcohol-based hand sanitizer. ? Change your dressing as told by your health care provider. ? Place any used dressings or infusion bags into a plastic bag. Throw that bag in the trash. ? Keep the dressing that covers the needle clean and dry. Do not get it wet. ? Do not use scissors or sharp objects near the tube. ? Keep the tube clamped, unless it is being used.  Check your port site every day for signs of  infection. Check for: ? Redness, swelling, or pain. ? Fluid or blood. ? Pus or a bad smell.  Protect the skin around the port site. ? Avoid wearing bra straps that rub or irritate the site. ? Protect the skin around your port from seat belts. Place a soft pad over your chest if needed.  Bathe or shower as told by your health care provider. The site may get wet as long as you are not actively receiving an infusion.  Return to your normal activities as told by your health care provider. Ask your health care provider what activities are safe for you.  Carry a medical alert card or wear a medical alert bracelet at all times. This will let health care providers know that you have an implanted port in case of an emergency. Get help right away if:  You have redness, swelling, or pain at the port site.  You have fluid or blood coming from your port site.  You have pus or a bad smell coming from the port site.  You have a fever. Summary  Implanted ports are usually placed in the chest for long-term IV access.  Follow instructions from your health care provider about flushing the port and changing bandages (dressings).  Take care of the area around your port by avoiding clothing that puts pressure on the area, and by watching for signs of infection.  Protect the skin around your port from seat belts. Place a soft pad over your chest if needed.  Get help right away if you have a fever or you have redness, swelling, pain, drainage, or a bad smell at the port site. This information is not intended to replace advice given to you by your health care provider. Make sure you discuss any questions you have with your health care provider. Document Revised: 07/27/2018 Document Reviewed: 05/07/2016 Elsevier Patient Education  [place] An implanted port is a device that is placed under the skin. It is usually placed in the chest. The device can be used to give IV  medicine, to take blood, or for dialysis. You may have an implanted port if:  You need IV medicine that would be irritating to the small veins in your hands or arms.  You need IV medicines, such as antibiotics, for a long period of time.  You need IV nutrition for a long period of time.  You need dialysis. Having a port means that your health care provider will not need to use the veins in your arms for these procedures. You may have fewer limitations when using a port than you would if you used other types of long-term IVs, and you will likely be able to return to normal activities after  your incision heals. An implanted port has two main parts:  Reservoir. The reservoir is the part where a needle is inserted to give medicines or draw blood. The reservoir is round. After it is placed, it appears as a small, raised area under your skin.  Catheter. The catheter is a thin, flexible tube that connects the reservoir to a vein. Medicine that is inserted into the reservoir goes into the catheter and then into the vein. How is my port accessed? To access your port:  A numbing cream may be placed on the skin over the port site.  Your health care provider will put on a mask and sterile gloves.  The skin over your port will be cleaned carefully with a germ-killing soap and allowed to dry.  Your health care provider will gently pinch the port and insert a needle into it.  Your health care provider will check for a blood return to make sure the port is in the vein and is not clogged.  If your port needs to remain accessed to get medicine continuously (constant infusion), your health care provider will place a clear bandage (dressing) over the needle site. The dressing and needle will need to be changed every week, or as told by your health care provider. What is flushing? Flushing helps keep the port from getting clogged. Follow instructions from your health care provider about how and when to  flush the port. Ports are usually flushed with saline solution or a medicine called heparin. The need for flushing will depend on how the port is used:  If the port is only used from time to time to give medicines or draw blood, the port may need to be flushed: ? Before and after medicines have been given. ? Before and after blood has been drawn. ? As part of routine maintenance. Flushing may be recommended every 4-6 weeks.  If a constant infusion is running, the port may not need to be flushed.  Throw away any syringes in a disposal container that is meant for sharp items (sharps container). You can buy a sharps container from a pharmacy, or you can make one by using an empty hard plastic bottle with a cover. How long will my port stay implanted? The port can stay in for as long as your health care provider thinks it is needed. When it is time for the port to come out, a surgery will be done to remove it. The surgery will be similar to the procedure that was done to put the port in. Follow these instructions at home:   Flush your port as told by your health care provider.  If you need an infusion over several days, follow instructions from your health care provider about how to take care of your port site. Make sure you: ? Wash your hands with soap and water before you change your dressing. If soap and water are not available, use alcohol-based hand sanitizer. ? Change your dressing as told by your health care provider. ? Place any used dressings or infusion bags into a plastic bag. Throw that bag in the trash. ? Keep the dressing that covers the needle clean and dry. Do not get it wet. ? Do not use scissors or sharp objects near the tube. ? Keep the tube clamped, unless it is being used.  Check your port site every day for signs of infection. Check for: ? Redness, swelling, or pain. ? Fluid or blood. ? Pus or a bad smell.  Protect the skin around the port site. ? Avoid wearing bra  straps that rub or irritate the site. ? Protect the skin around your port from seat belts. Place a soft pad over your chest if needed.  Bathe or shower as told by your health care provider. The site may get wet as long as you are not actively receiving an infusion.  Return to your normal activities as told by your health care provider. Ask your health care provider what activities are safe for you.  Carry a medical alert card or wear a medical alert bracelet at all times. This will let health care providers know that you have an implanted port in case of an emergency. Get help right away if:  You have redness, swelling, or pain at the port site.  You have fluid or blood coming from your port site.  You have pus or a bad smell coming from the port site.  You have a fever. Summary  Implanted ports are usually placed in the chest for long-term IV access.  Follow instructions from your health care provider about flushing the port and changing bandages (dressings).  Take care of the area around your port by avoiding clothing that puts pressure on the area, and by watching for signs of infection.  Protect the skin around your port from seat belts. Place a soft pad over your chest if needed.  Get help right away if you have a fever or you have redness, swelling, pain, drainage, or a bad smell at the port site. This information is not intended to replace advice given to you by your health care provider. Make sure you discuss any questions you have with your health care provider. Document Revised: 07/27/2018 Document Reviewed: 05/07/2016 Elsevier Patient Education  2020 [employer].

## 2019-11-29 ENCOUNTER — Inpatient Hospital Stay: Payer: Medicare Other

## 2019-11-29 ENCOUNTER — Inpatient Hospital Stay (HOSPITAL_BASED_OUTPATIENT_CLINIC_OR_DEPARTMENT_OTHER): Payer: Medicare Other | Admitting: Hematology and Oncology

## 2019-11-29 ENCOUNTER — Other Ambulatory Visit: Payer: Self-pay

## 2019-11-29 ENCOUNTER — Encounter: Payer: Self-pay | Admitting: Hematology and Oncology

## 2019-11-29 DIAGNOSIS — C779 Secondary and unspecified malignant neoplasm of lymph node, unspecified: Secondary | ICD-10-CM | POA: Diagnosis not present

## 2019-11-29 DIAGNOSIS — R918 Other nonspecific abnormal finding of lung field: Secondary | ICD-10-CM | POA: Diagnosis not present

## 2019-11-29 DIAGNOSIS — C541 Malignant neoplasm of endometrium: Secondary | ICD-10-CM

## 2019-11-29 DIAGNOSIS — N183 Chronic kidney disease, stage 3 unspecified: Secondary | ICD-10-CM

## 2019-11-29 DIAGNOSIS — C773 Secondary and unspecified malignant neoplasm of axilla and upper limb lymph nodes: Secondary | ICD-10-CM | POA: Diagnosis not present

## 2019-11-29 DIAGNOSIS — Z5112 Encounter for antineoplastic immunotherapy: Secondary | ICD-10-CM | POA: Diagnosis not present

## 2019-11-29 DIAGNOSIS — R591 Generalized enlarged lymph nodes: Secondary | ICD-10-CM | POA: Diagnosis not present

## 2019-11-29 DIAGNOSIS — Z7189 Other specified counseling: Secondary | ICD-10-CM

## 2019-11-29 DIAGNOSIS — Z9221 Personal history of antineoplastic chemotherapy: Secondary | ICD-10-CM | POA: Diagnosis not present

## 2019-11-29 DIAGNOSIS — Z923 Personal history of irradiation: Secondary | ICD-10-CM | POA: Diagnosis not present

## 2019-11-29 LAB — T4: T4, Total: 8 ug/dL (ref 4.5–12.0)

## 2019-11-29 MED ORDER — SODIUM CHLORIDE 0.9% FLUSH
10.0000 mL | INTRAVENOUS | Status: DC | PRN
  Administered 2019-11-29: 10 mL
  Filled 2019-11-29: qty 10

## 2019-11-29 MED ORDER — SODIUM CHLORIDE 0.9 % IV SOLN
200.0000 mg | Freq: Once | INTRAVENOUS | Status: AC
  Administered 2019-11-29: 200 mg via INTRAVENOUS
  Filled 2019-11-29: qty 8

## 2019-11-29 MED ORDER — SODIUM CHLORIDE 0.9 % IV SOLN
Freq: Once | INTRAVENOUS | Status: AC
  Filled 2019-11-29: qty 250

## 2019-11-29 MED ORDER — HEPARIN SOD (PORK) LOCK FLUSH 100 UNIT/ML IV SOLN
500.0000 [IU] | Freq: Once | INTRAVENOUS | Status: AC | PRN
  Administered 2019-11-29: 500 [IU]
  Filled 2019-11-29: qty 5

## 2019-11-29 NOTE — Assessment & Plan Note (Signed)
The lymphadenopathy is stable °We will repeat imaging study in a few months °

## 2019-11-29 NOTE — Assessment & Plan Note (Signed)
Her renal function is stable We discussed the importance of risk factor modification and adequate hydration I will not order CT imaging with contrast in the future 

## 2019-11-29 NOTE — Assessment & Plan Note (Signed)
I have reviewed her imaging study She has no evidence of disease progression Overall, the lymphadenopathy is stable She tolerated treatment very well with no side effects I recommend we proceed with treatment indefinitely I plan to repeat imaging study again at the end of the year for further follow-up

## 2019-11-29 NOTE — Patient Instructions (Signed)
[facility] Discharge Instructions for Patients Receiving Chemotherapy  Today you received the following chemotherapy agents:  Keytruda.  To help prevent nausea and vomiting after your treatment, we encourage you to take your nausea medication as directed.   If you develop nausea and vomiting that is not controlled by your nausea medication, call the clinic.   BELOW ARE SYMPTOMS THAT SHOULD BE REPORTED IMMEDIATELY:  *FEVER GREATER THAN 100.5 F  *CHILLS WITH OR WITHOUT FEVER  NAUSEA AND VOMITING THAT IS NOT CONTROLLED WITH YOUR NAUSEA MEDICATION  *UNUSUAL SHORTNESS OF BREATH  *UNUSUAL BRUISING OR BLEEDING  TENDERNESS IN MOUTH AND THROAT WITH OR WITHOUT PRESENCE OF ULCERS  *URINARY PROBLEMS  *BOWEL PROBLEMS  UNUSUAL RASH Items with * indicate a potential emergency and should be followed up as soon as possible.  Feel free to call the clinic should you have any questions or concerns. The clinic phone number is [phone].  Please show the CHEMO ALERT CARD at check-in to the Emergency Department and triage nurse.    

## 2019-11-29 NOTE — Progress Notes (Signed)
Per Dr. Alvy Bimler, okay for patient to proceed with treatment today with creatine 1.93.

## 2019-11-29 NOTE — Progress Notes (Signed)
[place] OFFICE PROGRESS NOTE  Patient Care Team: Jonathon Jordan, MD as PCP - General (Family Medicine) Awanda Mink Craige Cotta, RN as Registered Nurse (Oncology)  ASSESSMENT & PLAN:  Endometrial cancer [facility]) I have reviewed her imaging study She has no evidence of disease progression Overall, the lymphadenopathy is stable She tolerated treatment very well with no side effects I recommend we proceed with treatment indefinitely I plan to repeat imaging study again at the end of the year for further follow-up  Chronic kidney disease (CKD), stage III (moderate) ([place]) Her renal function is stable We discussed the importance of risk factor modification and adequate hydration I will not order CT imaging with contrast in the future  Metastasis to lymph nodes (HCC) The lymphadenopathy is stable We will repeat imaging study in a few months  Multiple lung nodules on CT These are stable and likely benign in nature Observe closely with serial imaging study as above   No orders of the defined types were placed in this encounter.   All questions were answered. The patient knows to call the clinic with any problems, questions or concerns. The total time spent in the appointment was 20 minutes encounter with patients including review of chart and various tests results, discussions about plan of care and coordination of care plan   [name], MD 11/29/2019 9:02 AM  INTERVAL HISTORY: Please see below for problem oriented charting. 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 Leo Grosser who obtained an endometrial biopsy showing poorly differentiated endometrial carcinoma and negative endocervical curettage   12/12/2017 Imaging   MAMMOGRAM FINDINGS: In the right axilla, a possible mass warrants further evaluation. In the left breast, no findings suspicious for malignancy.  Images were processed with CAD.  IMPRESSION: Further evaluation is suggested for possible mass in the right axilla.    12/24/2017 Imaging   Ct scan chest, abdomen and pelvis 1. Marked thickening of the endometrium (42 mm) compatible with known primary endometrial malignancy. No evidence of extrauterine invasion. 2. No pelvic or retroperitoneal adenopathy. 3. Right middle lobe 4 mm solid pulmonary nodule, for which follow-up chest CT is advised in 3-6 months. 4. Several findings that are equivocal for distant metastatic disease. Vaguely nodular heterogeneous hyperenhancement in the peripheral right liver lobe, which could represent benign transient perfusional phenomena, with underlying liver lesions not entirely excluded. Mildly sclerotic T12 vertebral lesion. Mildly enlarged right axillary lymph node. The best single test to further evaluate these findings would be a PET-CT. Alternative tests include bone scan or thoracic MRI without and with IV contrast for the T12 osseous lesion, MRI abdomen without and with IV contrast for the liver findings, and diagnostic mammographic evaluation for the right axillary node. 5.  Aortic Atherosclerosis (ICD10-I70.0).    01/09/2018 PET scan   1. Moderate hypermetabolism corresponding to enlarging axillary node since 12/22/2017. Highly suspicious for an atypical distribution of metastatic disease. 2. No hypermetabolism to suggest hepatic or T12 osseous metastasis. 3. Hypermetabolic endometrial primary.   01/23/2018 Initial Diagnosis   Endometrial cancer ([place])   01/23/2018 Pathology  Results   1. Lymph node, sentinel,  biopsy, left external iliac - NO CARCINOMA IDENTIFIED IN ONE LYMPH NODE (0/1) - SEE COMMENT 2. Lymph nodes, regional resection, right para aortic - NO CARCINOMA IDENTIFIED IN FOUR LYMPH NODES (0/4) - SEE COMMENT 3. Lymph nodes, regional resection, right pelvic - NO CARCINOMA IDENTIFIED IN EIGHT LYMPH NODES (0/8) - SEE COMMENT 4. Cul-de-sac biopsy - METASTATIC CARCINOMA 5. Uterus +/- tubes/ovaries, neoplastic, cervix, bilateral fallopian tubes and ovaries UTERUS: - MIXED SEROUS AND ENDOMETRIOID CARCINOMA - SEROSAL IMPLANTS PRESENT - LYMPHOVASCULAR SPACE INVASION PRESENT - LEIOMYOMATA (1.5 CM; LARGEST) - SEE ONCOLOGY TABLE AND COMMENT BELOW CERVIX: - BENIGN NABOTHIAN CYSTS - NO CARCINOMA IDENTIFIED BILATERAL OVARIES: - METASTATIC CARCINOMA PRESENT ON OVARIAN SURFACE BILATERAL FALLOPIAN TUBES: - INTRALUMINAL CARCINOMA Microscopic Comment 1. -3. Cytokeratin AE1/3 was performed on the sentinel lymph nodes to exclude micrometastasis. There is no evidence of metastatic carcinoma by immunohistochemistry. 5. 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%) Histologic Grade: N/A Myometrial invasion: Estimated less than 50% myometrial invasion (0.3 cm of myometrium involved; 1.4 cm measured thickness) Uterine Serosa Involvement: Present Cervical stromal involvement: Not identified Extent of involvement of other organs: - Fallopian tube (left within the lumen) - Ovary, left (surface involvement) - Cul-de-sac Lymphovascular invasion: Present Regional Lymph Nodes: Examined: 1 Sentinel 12 Non-sentinel 13 Total Lymph nodes with metastasis: 0 Isolated tumor cells (< 0.2 mm): 0 Micrometastasis: (> 0.2 mm and < 2.0 mm): 0 Macrometastasis: (> 2.0 mm):  0 Extracapsular extension: N/A Tumor block for ancillary studies: 5E, 5B MMR / MSI testing: Pending will be reported separately Pathologic Stage Classification (pTNM, AJCC 8th edition): pT3a, pN0 FIGO Stage: IIIA COMMENT: There is tumor present on the surface of the left ovary and within the lumen of the left fallopian tube. The carcinoma appears to be mixed with the largest component being high grade serous carcinoma. Dr. Lyndon Code reviewed the case and agrees with the above diagnosis.   01/23/2018 Surgery   Surgeon: Donaciano Eva   Operation: Robotic-assisted laparoscopic total hysterectomy with bilateral salpingoophorectomy, SLN injection, mapping and biopsy, right pelvic and para-aortic lymphadenectomy  Operative Findings:  : 10-12cm bulky uterus with frank serosal involvement on posterior cul de sac peritoneum and anterior peritoneum which adhesed the bladder to the anterior uterus. Unilateral mapping on left pelvis. No grossly suspicious nodes. Normal omentum and diaphragms.    02/01/2018 Pathology Results   Lymph node, needle/core biopsy, right axilla - METASTATIC CARCINOMA - SEE COMMENT Microscopic Comment The neoplastic cells are positive for cytokeratin 7 and Pax-8 but negative for cytokeratin 5/6, cytokeratin 20, Gata-3, p63, p53 and GCDFP. Overall, the immunoprofile is consistent with metastasis from the patient's known gynecologic carcinoma.   02/01/2018 Procedure   Ultrasound-guided core biopsies of a suspicious right axillary lymph node.   02/21/2018 - 06/13/2018 Chemotherapy   The patient had carboplatin & Taxol x 6   03/26/2018 - 04/30/2018 Radiation Therapy   Radiation treatment dates:   03/26/18, 04/02/18, 04/19/18, 04/23/18 04/30/18  Site/dose: proximal Vagina, 6 Gy in 5 fractions for a total dose of 30 Gy    04/26/2018 PET scan   1. Interval resection of the hypermetabolic endometrial primary. No discernible hypermetabolic pelvic sidewall or peritoneal  metastases. 2. Stable hypermetabolic bilateral axillary lymphadenopathy. The degree of hypermetabolism in these lymph nodes remains highly suspicious for neoplasm. 3. Interval  development of low level FDG uptake in 2 stable, small lymph nodes in the left groin region. This may be reactive, but close attention recommended to exclude metastatic involvement. 4. Stable non hypermetabolic low-density liver lesions and the mixed lucent and sclerotic T12 lesion is stable without hypermetabolism today.   07/09/2018 Imaging   Status post hysterectomy.  Mild axillary lymphadenopathy, improved. Nodal metastases not excluded.  Irregular wall thickening involving the anterior bladder, possibly reflecting radiation cystitis versus tumor.  Additional stable findings as above, including a 5 mm right middle lobe nodule and stable sclerosis involving the T12 vertebral body.   10/10/2018 Imaging   1. Stable exam. No findings within the abdomen or pelvis to suggest metastatic disease. 2. Unchanged appearance of sclerosis involving the T12 vertebra. 3.  Aortic Atherosclerosis (ICD10-I70.0).   10/10/2018 Imaging   Ct abdomen and pelvis 1. Stable exam. No findings within the abdomen or pelvis to suggest metastatic disease. 2. Unchanged appearance of sclerosis involving the T12 vertebra. 3.  Aortic Atherosclerosis (ICD10-I70.0).   01/16/2019 PET scan   1. Enlargement and increased activity in the left axillary, right axillary, and left inguinal adenopathy compatible with progressive malignancy. 2. Stable 4 mm right middle lobe subpleural nodule, not appreciably hypermetabolic. 3. Other imaging findings of potential clinical significance: Right kidney lower pole cyst. Aortic Atherosclerosis (ICD10-I70.0). Prominent stool throughout the colon favors constipation. Mild chronic left maxillary sinusitis.     02/01/2019 -  Chemotherapy   The patient had pembrolizumab and Lenvima for chemotherapy treatment.      04/25/2019 Imaging   Ct imaging 1. Interval response to therapy. Decrease in size of bilateral axillary and left inguinal lymph nodes. No new or progressive findings identified. 2. Stable sclerotic appearance of the T12 vertebra. 3. Aortic atherosclerosis. Lad coronary artery calcification noted.   08/30/2019 Imaging   1. Bulky RIGHT hilar lymph node, slightly increased in size from previous imaging. 2. Multiple foci of hyperenhancement in the liver. 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 Consider MRI liver on follow-up to establish a baseline for number of lesions as these will appear variable on CT follow-up based on phase of contrast acquisition in the future. 3. Stable 5 mm nodule adjacent to the fissure, minor fissure in the RIGHT middle lobe.     11/28/2019 Imaging   1. Stable exam. No new or progressive findings. 2. Stable enlarged right axillary lymph node. 3. Stable tiny bilateral pulmonary nodules. Continued attention on follow-up recommended. 4. Scattered hypodensities in the liver parenchyma correspond to the hypervascular lesions seen on the previous study performed with intravenous contrast material. 5. Right renal cyst. 6. Aortic Atherosclerosis (ICD10-I70.0).       REVIEW OF SYSTEMS:   Constitutional: Denies fevers, chills or abnormal weight loss Eyes: Denies blurriness of vision Ears, nose, mouth, throat, and face: Denies mucositis or sore throat Respiratory: Denies cough, dyspnea or wheezes Cardiovascular: Denies palpitation, chest discomfort or lower extremity swelling Gastrointestinal:  Denies nausea, heartburn or change in bowel habits Skin: Denies abnormal skin rashes Lymphatics: Denies new lymphadenopathy or easy bruising Neurological:Denies numbness, tingling or  new weaknesses Behavioral/Psych: Mood is stable, no new changes  All other systems were reviewed with the patient and are negative.  I have reviewed the past medical history, past surgical history, social history  and family history with the patient and they are unchanged from previous note.  ALLERGIES:  is allergic to sulfa antibiotics and sulfamethoxazole.  MEDICATIONS:  Current Outpatient Medications  Medication Sig Dispense Refill  . insulin isophane & regular human (NOVOLIN 70/30 FLEXPEN) (70-30) 100 UNIT/ML KwikPen Inject 18 Units into the skin daily with breakfast. 15 mL 0  . ketoconazole (NIZORAL) 2 % cream Apply to web space twice a day for 1 week then daily for 1 month 15 g 0  . lenvatinib 10 mg daily dose (LENVIMA, 10 MG DAILY DOSE,) capsule Take 1 capsule (10 mg total) by mouth daily. 30 capsule 11  . levothyroxine (SYNTHROID) 75 MCG tablet Take 1 tablet (75 mcg total) by mouth daily before breakfast. 30 tablet 11  . lidocaine-prilocaine (EMLA) cream Apply to affected area once 30 g 3  . lisinopril-hydrochlorothiazide (PRINZIDE,ZESTORETIC) 20-12.5 MG tablet Take 1 tablet by mouth daily. (Patient taking differently: Take 1 tablet by mouth every morning. ) 30 tablet 11  . ondansetron (ZOFRAN) 8 MG tablet Take 1 tablet (8 mg total) by mouth daily. 30 tablet 1  . OneTouch Delica Lancets 26O MISC 1 each by Other route 2 (two) times daily. Use to monitor glucose levels BID; E11.65 100 each 2  . ONETOUCH VERIO test strip 1 each by Other route 3 (three) times daily. And lancets 3/day 300 each 3  . RELION PEN NEEDLES 32G X 4 MM MISC USE AS DIRECTED 50 each 0  . simvastatin (ZOCOR) 10 MG tablet Take 10 mg by mouth at bedtime.     . Vitamin D, Ergocalciferol, 2000 units CAPS Take 2,000 Units by mouth daily.   0   No current facility-administered medications for this visit.    PHYSICAL EXAMINATION: ECOG PERFORMANCE STATUS: 0 - Asymptomatic  NEURO: alert & oriented x 3 with fluent speech,  no focal motor/sensory deficits  LABORATORY DATA:  I have reviewed the data as listed    Component Value Date/Time   NA 142 11/28/2019 0807   K 4.0 11/28/2019 0807   CL 112 (H) 11/28/2019 0807   CO2 19 (L) 11/28/2019 0807   GLUCOSE 98 11/28/2019 0807   BUN 46 (H) 11/28/2019 0807   CREATININE 1.93 (H) 11/28/2019 0807   CALCIUM 10.1 11/28/2019 0807   PROT 6.8 11/28/2019 0807   ALBUMIN 3.5 11/28/2019 0807   AST 13 (L) 11/28/2019 0807   ALT 8 11/28/2019 0807   ALKPHOS 77 11/28/2019 0807   BILITOT 0.5 11/28/2019 0807   GFRNONAA 26 (L) 11/28/2019 0807   GFRAA 30 (L) 11/28/2019 0807    No results found for: SPEP, UPEP  Lab Results  Component Value Date   WBC 4.2 11/28/2019   NEUTROABS 2.3 11/28/2019   HGB 11.1 (L) 11/28/2019   HCT 34.1 (L) 11/28/2019   MCV 86.8 11/28/2019   PLT 181 11/28/2019      Chemistry      Component Value Date/Time   NA 142 11/28/2019 0807   K 4.0 11/28/2019 0807   CL 112 (H) 11/28/2019 0807   CO2 19 (L) 11/28/2019 0807   BUN 46 (H) 11/28/2019 0807   CREATININE 1.93 (H) 11/28/2019 0807      Component Value Date/Time   CALCIUM 10.1 11/28/2019 0807   ALKPHOS 77 11/28/2019 0807   AST 13 (L) 11/28/2019 0807   ALT 8 11/28/2019 0807   BILITOT 0.5 11/28/2019 0807       RADIOGRAPHIC STUDIES: I have reviewed multiple imaging studies with the patient  I have personally reviewed the radiological images as listed and agreed with the findings in the report. CT ABDOMEN PELVIS WO CONTRAST  Result Date: 11/28/2019 CLINICAL DATA:  Uterine/cervical cancer.  Restaging. EXAM: CT CHEST, ABDOMEN AND PELVIS WITHOUT CONTRAST TECHNIQUE: Multidetector CT imaging of the chest, abdomen and pelvis was performed following the standard protocol without IV contrast. COMPARISON:  08/30/2019 FINDINGS: CT CHEST FINDINGS Cardiovascular: The heart size is normal. No substantial pericardial effusion. Coronary artery calcification is evident. Atherosclerotic calcification is noted  in the wall of the thoracic aorta. Right Port-A-Cath tip is positioned in the mid to distal SVC. Mediastinum/Nodes: No mediastinal lymphadenopathy. No evidence for gross hilar lymphadenopathy although assessment is limited by the lack of intravenous contrast on today's study. The esophagus has normal imaging features. No left axillary lymphadenopathy. The enlarged right axillary node (described as hilar on the previous study) is stable at 18 mm short axis on image 9/series 2. Lungs/Pleura: 4 mm right middle lobe perifissural nodule on 60/4 is unchanged. 6 mm central left upper lobe ground-glass opacity (42/4) is unchanged. No new pulmonary nodule or mass. No focal airspace consolidation. Insert no effusions Musculoskeletal: No worrisome lytic or sclerotic osseous abnormality. CT ABDOMEN PELVIS FINDINGS Hepatobiliary: Scattered hypodensities in the liver parenchyma correspond to the hypervascular lesions seen on the previous study performed with intravenous contrast material. There is no evidence for gallstones, gallbladder wall thickening, or pericholecystic fluid. No intrahepatic or extrahepatic biliary dilation. Pancreas: No focal mass lesion. No dilatation of the main duct. No intraparenchymal cyst. No peripancreatic edema. Spleen: No splenomegaly. No focal mass lesion. Adrenals/Urinary Tract: No adrenal nodule or mass. No substantial change 3.9 cm exophytic right renal cyst. Left kidney unremarkable on noncontrast imaging. No evidence for hydroureter. The urinary bladder appears normal for the degree of distention. Stomach/Bowel: Stomach is unremarkable. No gastric wall thickening. No evidence of outlet obstruction. No small bowel wall thickening. No small bowel dilatation. The terminal ileum is normal. The appendix is normal. No gross colonic mass. No colonic wall thickening. Vascular/Lymphatic: There is abdominal aortic atherosclerosis without aneurysm. There is no gastrohepatic or hepatoduodenal ligament  lymphadenopathy. No retroperitoneal or mesenteric lymphadenopathy. No pelvic sidewall lymphadenopathy. Reproductive: The uterus is surgically absent. There is no adnexal mass. Other: No intraperitoneal free fluid. Musculoskeletal: No worrisome lytic or sclerotic osseous abnormality. IMPRESSION: 1. Stable exam. No new or progressive findings. 2. Stable enlarged right axillary lymph node. 3. Stable tiny bilateral pulmonary nodules. Continued attention on follow-up recommended. 4. Scattered hypodensities in the liver parenchyma correspond to the hypervascular lesions seen on the previous study performed with intravenous contrast material. 5. Right renal cyst. 6. Aortic Atherosclerosis (ICD10-I70.0). Electronically Signed   By: Misty Stanley M.D.   On: 11/28/2019 13:55   CT CHEST WO CONTRAST  Result Date: 11/28/2019 CLINICAL DATA:  Uterine/cervical cancer.  Restaging. EXAM: CT CHEST, ABDOMEN AND PELVIS WITHOUT CONTRAST TECHNIQUE: Multidetector CT imaging of the chest, abdomen and pelvis was performed following the standard protocol without IV contrast. COMPARISON:  08/30/2019 FINDINGS: CT CHEST FINDINGS Cardiovascular: The heart size is normal. No substantial pericardial effusion. Coronary artery calcification is evident. Atherosclerotic calcification is noted in the wall of the thoracic aorta. Right Port-A-Cath tip is positioned in the mid to distal SVC. Mediastinum/Nodes: No mediastinal lymphadenopathy. No evidence for gross hilar lymphadenopathy although assessment is limited by the lack of intravenous contrast on today's study. The esophagus has normal imaging features. No left axillary lymphadenopathy. The enlarged right axillary node (described as hilar on the  previous study) is stable at 18 mm short axis on image 9/series 2. Lungs/Pleura: 4 mm right middle lobe perifissural nodule on 60/4 is unchanged. 6 mm central left upper lobe ground-glass opacity (42/4) is unchanged. No new pulmonary nodule or mass. No  focal airspace consolidation. Insert no effusions Musculoskeletal: No worrisome lytic or sclerotic osseous abnormality. CT ABDOMEN PELVIS FINDINGS Hepatobiliary: Scattered hypodensities in the liver parenchyma correspond to the hypervascular lesions seen on the previous study performed with intravenous contrast material. There is no evidence for gallstones, gallbladder wall thickening, or pericholecystic fluid. No intrahepatic or extrahepatic biliary dilation. Pancreas: No focal mass lesion. No dilatation of the main duct. No intraparenchymal cyst. No peripancreatic edema. Spleen: No splenomegaly. No focal mass lesion. Adrenals/Urinary Tract: No adrenal nodule or mass. No substantial change 3.9 cm exophytic right renal cyst. Left kidney unremarkable on noncontrast imaging. No evidence for hydroureter. The urinary bladder appears normal for the degree of distention. Stomach/Bowel: Stomach is unremarkable. No gastric wall thickening. No evidence of outlet obstruction. No small bowel wall thickening. No small bowel dilatation. The terminal ileum is normal. The appendix is normal. No gross colonic mass. No colonic wall thickening. Vascular/Lymphatic: There is abdominal aortic atherosclerosis without aneurysm. There is no gastrohepatic or hepatoduodenal ligament lymphadenopathy. No retroperitoneal or mesenteric lymphadenopathy. No pelvic sidewall lymphadenopathy. Reproductive: The uterus is surgically absent. There is no adnexal mass. Other: No intraperitoneal free fluid. Musculoskeletal: No worrisome lytic or sclerotic osseous abnormality. IMPRESSION: 1. Stable exam. No new or progressive findings. 2. Stable enlarged right axillary lymph node. 3. Stable tiny bilateral pulmonary nodules. Continued attention on follow-up recommended. 4. Scattered hypodensities in the liver parenchyma correspond to the hypervascular lesions seen on the previous study performed with intravenous contrast material. 5. Right renal cyst. 6.  Aortic Atherosclerosis (ICD10-I70.0). Electronically Signed   By: Misty Stanley M.D.   On: 11/28/2019 13:55

## 2019-11-29 NOTE — Assessment & Plan Note (Signed)
These are stable and likely benign in nature Observe closely with serial imaging study as above 

## 2019-12-20 ENCOUNTER — Inpatient Hospital Stay: Payer: Medicare Other | Attending: Gynecology

## 2019-12-20 ENCOUNTER — Other Ambulatory Visit: Payer: Self-pay | Admitting: Endocrinology

## 2019-12-20 ENCOUNTER — Other Ambulatory Visit: Payer: Self-pay

## 2019-12-20 ENCOUNTER — Inpatient Hospital Stay: Payer: Medicare Other

## 2019-12-20 VITALS — BP 129/81 | HR 85 | Temp 97.7°F | Resp 18 | Wt 183.5 lb

## 2019-12-20 DIAGNOSIS — D638 Anemia in other chronic diseases classified elsewhere: Secondary | ICD-10-CM | POA: Insufficient documentation

## 2019-12-20 DIAGNOSIS — C773 Secondary and unspecified malignant neoplasm of axilla and upper limb lymph nodes: Secondary | ICD-10-CM

## 2019-12-20 DIAGNOSIS — C541 Malignant neoplasm of endometrium: Secondary | ICD-10-CM

## 2019-12-20 DIAGNOSIS — K769 Liver disease, unspecified: Secondary | ICD-10-CM | POA: Insufficient documentation

## 2019-12-20 DIAGNOSIS — I700 Atherosclerosis of aorta: Secondary | ICD-10-CM | POA: Insufficient documentation

## 2019-12-20 DIAGNOSIS — E039 Hypothyroidism, unspecified: Secondary | ICD-10-CM | POA: Diagnosis not present

## 2019-12-20 DIAGNOSIS — N183 Chronic kidney disease, stage 3 unspecified: Secondary | ICD-10-CM | POA: Insufficient documentation

## 2019-12-20 DIAGNOSIS — Z9221 Personal history of antineoplastic chemotherapy: Secondary | ICD-10-CM | POA: Diagnosis not present

## 2019-12-20 DIAGNOSIS — Z923 Personal history of irradiation: Secondary | ICD-10-CM | POA: Diagnosis not present

## 2019-12-20 DIAGNOSIS — Z794 Long term (current) use of insulin: Secondary | ICD-10-CM

## 2019-12-20 DIAGNOSIS — Z5112 Encounter for antineoplastic immunotherapy: Secondary | ICD-10-CM | POA: Diagnosis not present

## 2019-12-20 DIAGNOSIS — Z79899 Other long term (current) drug therapy: Secondary | ICD-10-CM | POA: Insufficient documentation

## 2019-12-20 DIAGNOSIS — N1831 Chronic kidney disease, stage 3a: Secondary | ICD-10-CM

## 2019-12-20 DIAGNOSIS — Z7189 Other specified counseling: Secondary | ICD-10-CM

## 2019-12-20 LAB — CBC WITH DIFFERENTIAL (CANCER CENTER ONLY)
Abs Immature Granulocytes: 0.01 10*3/uL (ref 0.00–0.07)
Basophils Absolute: 0 10*3/uL (ref 0.0–0.1)
Basophils Relative: 0 %
Eosinophils Absolute: 0 10*3/uL (ref 0.0–0.5)
Eosinophils Relative: 1 %
HCT: 32.9 % — ABNORMAL LOW (ref 36.0–46.0)
Hemoglobin: 10.7 g/dL — ABNORMAL LOW (ref 12.0–15.0)
Immature Granulocytes: 0 %
Lymphocytes Relative: 31 %
Lymphs Abs: 1.2 10*3/uL (ref 0.7–4.0)
MCH: 28.8 pg (ref 26.0–34.0)
MCHC: 32.5 g/dL (ref 30.0–36.0)
MCV: 88.7 fL (ref 80.0–100.0)
Monocytes Absolute: 0.2 10*3/uL (ref 0.1–1.0)
Monocytes Relative: 6 %
Neutro Abs: 2.4 10*3/uL (ref 1.7–7.7)
Neutrophils Relative %: 62 %
Platelet Count: 180 10*3/uL (ref 150–400)
RBC: 3.71 MIL/uL — ABNORMAL LOW (ref 3.87–5.11)
RDW: 13.7 % (ref 11.5–15.5)
WBC Count: 3.9 10*3/uL — ABNORMAL LOW (ref 4.0–10.5)
nRBC: 0 % (ref 0.0–0.2)

## 2019-12-20 LAB — CMP (CANCER CENTER ONLY)
ALT: <6 U/L (ref 0–44)
AST: 11 U/L — ABNORMAL LOW (ref 15–41)
Albumin: 3.4 g/dL — ABNORMAL LOW (ref 3.5–5.0)
Alkaline Phosphatase: 72 U/L (ref 38–126)
Anion gap: 10 (ref 5–15)
BUN: 31 mg/dL — ABNORMAL HIGH (ref 8–23)
CO2: 21 mmol/L — ABNORMAL LOW (ref 22–32)
Calcium: 9.4 mg/dL (ref 8.9–10.3)
Chloride: 110 mmol/L (ref 98–111)
Creatinine: 1.55 mg/dL — ABNORMAL HIGH (ref 0.44–1.00)
GFR, Est AFR Am: 39 mL/min — ABNORMAL LOW (ref >60)
GFR, Estimated: 34 mL/min — ABNORMAL LOW (ref >60)
Glucose, Bld: 129 mg/dL — ABNORMAL HIGH (ref 70–99)
Potassium: 3.8 mmol/L (ref 3.5–5.1)
Sodium: 141 mmol/L (ref 135–145)
Total Bilirubin: 0.4 mg/dL (ref 0.3–1.2)
Total Protein: 6.5 g/dL (ref 6.5–8.1)

## 2019-12-20 LAB — TSH: TSH: 3.863 u[IU]/mL (ref 0.308–3.960)

## 2019-12-20 MED ORDER — SODIUM CHLORIDE 0.9 % IV SOLN
200.0000 mg | Freq: Once | INTRAVENOUS | Status: AC
  Administered 2019-12-20: 200 mg via INTRAVENOUS
  Filled 2019-12-20: qty 8

## 2019-12-20 MED ORDER — HEPARIN SOD (PORK) LOCK FLUSH 100 UNIT/ML IV SOLN
500.0000 [IU] | Freq: Once | INTRAVENOUS | Status: DC | PRN
  Filled 2019-12-20: qty 5

## 2019-12-20 MED ORDER — SODIUM CHLORIDE 0.9 % IV SOLN
Freq: Once | INTRAVENOUS | Status: AC
  Filled 2019-12-20: qty 250

## 2019-12-20 MED ORDER — SODIUM CHLORIDE 0.9% FLUSH
10.0000 mL | Freq: Once | INTRAVENOUS | Status: AC
  Administered 2019-12-20: 10 mL
  Filled 2019-12-20: qty 10

## 2019-12-20 MED ORDER — SODIUM CHLORIDE 0.9% FLUSH
10.0000 mL | INTRAVENOUS | Status: DC | PRN
  Filled 2019-12-20: qty 10

## 2019-12-20 NOTE — Patient Instructions (Signed)
[facility] Discharge Instructions for Patients Receiving Chemotherapy  Today you received the following chemotherapy agents:  Keytruda.  To help prevent nausea and vomiting after your treatment, we encourage you to take your nausea medication as directed.   If you develop nausea and vomiting that is not controlled by your nausea medication, call the clinic.   BELOW ARE SYMPTOMS THAT SHOULD BE REPORTED IMMEDIATELY:  *FEVER GREATER THAN 100.5 F  *CHILLS WITH OR WITHOUT FEVER  NAUSEA AND VOMITING THAT IS NOT CONTROLLED WITH YOUR NAUSEA MEDICATION  *UNUSUAL SHORTNESS OF BREATH  *UNUSUAL BRUISING OR BLEEDING  TENDERNESS IN MOUTH AND THROAT WITH OR WITHOUT PRESENCE OF ULCERS  *URINARY PROBLEMS  *BOWEL PROBLEMS  UNUSUAL RASH Items with * indicate a potential emergency and should be followed up as soon as possible.  Feel free to call the clinic should you have any questions or concerns. The clinic phone number is [phone].  Please show the CHEMO ALERT CARD at check-in to the Emergency Department and triage nurse.    

## 2019-12-20 NOTE — Progress Notes (Signed)
Ok to treat today per Dr. Alvy Bimler with Cr. 1.55

## 2019-12-21 LAB — T4: T4, Total: 7.2 ug/dL (ref 4.5–12.0)

## 2019-12-26 DIAGNOSIS — Z0000 Encounter for general adult medical examination without abnormal findings: Secondary | ICD-10-CM | POA: Diagnosis not present

## 2019-12-26 DIAGNOSIS — E210 Primary hyperparathyroidism: Secondary | ICD-10-CM | POA: Diagnosis not present

## 2019-12-26 DIAGNOSIS — E1122 Type 2 diabetes mellitus with diabetic chronic kidney disease: Secondary | ICD-10-CM | POA: Diagnosis not present

## 2019-12-26 DIAGNOSIS — E785 Hyperlipidemia, unspecified: Secondary | ICD-10-CM | POA: Diagnosis not present

## 2019-12-26 DIAGNOSIS — Z79899 Other long term (current) drug therapy: Secondary | ICD-10-CM | POA: Diagnosis not present

## 2019-12-26 DIAGNOSIS — E559 Vitamin D deficiency, unspecified: Secondary | ICD-10-CM | POA: Diagnosis not present

## 2019-12-26 DIAGNOSIS — I10 Essential (primary) hypertension: Secondary | ICD-10-CM | POA: Diagnosis not present

## 2020-01-03 ENCOUNTER — Other Ambulatory Visit: Payer: Self-pay | Admitting: *Deleted

## 2020-01-03 MED ORDER — LENVIMA (10 MG DAILY DOSE) 10 MG PO CPPK: 10.0000 mg | ORAL_CAPSULE | Freq: Every day | ORAL | 11 refills | Status: DC

## 2020-01-07 ENCOUNTER — Ambulatory Visit (INDEPENDENT_AMBULATORY_CARE_PROVIDER_SITE_OTHER): Payer: Medicare Other | Admitting: Podiatry

## 2020-01-07 ENCOUNTER — Other Ambulatory Visit: Payer: Self-pay

## 2020-01-07 ENCOUNTER — Encounter: Payer: Self-pay | Admitting: Podiatry

## 2020-01-07 ENCOUNTER — Ambulatory Visit: Payer: Medicare Other | Admitting: Podiatry

## 2020-01-07 DIAGNOSIS — T451X5A Adverse effect of antineoplastic and immunosuppressive drugs, initial encounter: Secondary | ICD-10-CM

## 2020-01-07 DIAGNOSIS — M79675 Pain in left toe(s): Secondary | ICD-10-CM

## 2020-01-07 DIAGNOSIS — Z794 Long term (current) use of insulin: Secondary | ICD-10-CM

## 2020-01-07 DIAGNOSIS — E0822 Diabetes mellitus due to underlying condition with diabetic chronic kidney disease: Secondary | ICD-10-CM

## 2020-01-07 DIAGNOSIS — M79674 Pain in right toe(s): Secondary | ICD-10-CM

## 2020-01-07 DIAGNOSIS — N183 Chronic kidney disease, stage 3 unspecified: Secondary | ICD-10-CM

## 2020-01-07 DIAGNOSIS — G620 Drug-induced polyneuropathy: Secondary | ICD-10-CM

## 2020-01-07 DIAGNOSIS — B351 Tinea unguium: Secondary | ICD-10-CM | POA: Diagnosis not present

## 2020-01-10 ENCOUNTER — Inpatient Hospital Stay: Payer: Medicare Other

## 2020-01-10 ENCOUNTER — Telehealth: Payer: Self-pay | Admitting: Hematology and Oncology

## 2020-01-10 ENCOUNTER — Other Ambulatory Visit: Payer: Self-pay

## 2020-01-10 ENCOUNTER — Inpatient Hospital Stay (HOSPITAL_BASED_OUTPATIENT_CLINIC_OR_DEPARTMENT_OTHER): Payer: Medicare Other | Admitting: Hematology and Oncology

## 2020-01-10 ENCOUNTER — Encounter: Payer: Self-pay | Admitting: Hematology and Oncology

## 2020-01-10 VITALS — BP 119/64 | HR 86 | Temp 97.2°F | Resp 18 | Ht 66.0 in | Wt 184.4 lb

## 2020-01-10 DIAGNOSIS — C541 Malignant neoplasm of endometrium: Secondary | ICD-10-CM

## 2020-01-10 DIAGNOSIS — N183 Chronic kidney disease, stage 3 unspecified: Secondary | ICD-10-CM | POA: Diagnosis not present

## 2020-01-10 DIAGNOSIS — Z5112 Encounter for antineoplastic immunotherapy: Secondary | ICD-10-CM | POA: Diagnosis not present

## 2020-01-10 DIAGNOSIS — C773 Secondary and unspecified malignant neoplasm of axilla and upper limb lymph nodes: Secondary | ICD-10-CM

## 2020-01-10 DIAGNOSIS — E039 Hypothyroidism, unspecified: Secondary | ICD-10-CM | POA: Diagnosis not present

## 2020-01-10 DIAGNOSIS — D638 Anemia in other chronic diseases classified elsewhere: Secondary | ICD-10-CM | POA: Diagnosis not present

## 2020-01-10 DIAGNOSIS — Z923 Personal history of irradiation: Secondary | ICD-10-CM | POA: Diagnosis not present

## 2020-01-10 DIAGNOSIS — Z9221 Personal history of antineoplastic chemotherapy: Secondary | ICD-10-CM | POA: Diagnosis not present

## 2020-01-10 DIAGNOSIS — Z7189 Other specified counseling: Secondary | ICD-10-CM

## 2020-01-10 LAB — CBC WITH DIFFERENTIAL (CANCER CENTER ONLY)
Abs Immature Granulocytes: 0.01 10*3/uL (ref 0.00–0.07)
Basophils Absolute: 0 10*3/uL (ref 0.0–0.1)
Basophils Relative: 0 %
Eosinophils Absolute: 0 10*3/uL (ref 0.0–0.5)
Eosinophils Relative: 1 %
HCT: 33.1 % — ABNORMAL LOW (ref 36.0–46.0)
Hemoglobin: 10.6 g/dL — ABNORMAL LOW (ref 12.0–15.0)
Immature Granulocytes: 0 %
Lymphocytes Relative: 33 %
Lymphs Abs: 1.4 10*3/uL (ref 0.7–4.0)
MCH: 28 pg (ref 26.0–34.0)
MCHC: 32 g/dL (ref 30.0–36.0)
MCV: 87.6 fL (ref 80.0–100.0)
Monocytes Absolute: 0.2 10*3/uL (ref 0.1–1.0)
Monocytes Relative: 5 %
Neutro Abs: 2.6 10*3/uL (ref 1.7–7.7)
Neutrophils Relative %: 61 %
Platelet Count: 189 10*3/uL (ref 150–400)
RBC: 3.78 MIL/uL — ABNORMAL LOW (ref 3.87–5.11)
RDW: 14 % (ref 11.5–15.5)
WBC Count: 4.2 10*3/uL (ref 4.0–10.5)
nRBC: 0 % (ref 0.0–0.2)

## 2020-01-10 LAB — CMP (CANCER CENTER ONLY)
ALT: 11 U/L (ref 0–44)
AST: 13 U/L — ABNORMAL LOW (ref 15–41)
Albumin: 3.4 g/dL — ABNORMAL LOW (ref 3.5–5.0)
Alkaline Phosphatase: 71 U/L (ref 38–126)
Anion gap: 5 (ref 5–15)
BUN: 34 mg/dL — ABNORMAL HIGH (ref 8–23)
CO2: 24 mmol/L (ref 22–32)
Calcium: 9.7 mg/dL (ref 8.9–10.3)
Chloride: 110 mmol/L (ref 98–111)
Creatinine: 1.75 mg/dL — ABNORMAL HIGH (ref 0.44–1.00)
GFR, Est AFR Am: 34 mL/min — ABNORMAL LOW (ref >60)
GFR, Estimated: 29 mL/min — ABNORMAL LOW (ref >60)
Glucose, Bld: 91 mg/dL (ref 70–99)
Potassium: 3.9 mmol/L (ref 3.5–5.1)
Sodium: 139 mmol/L (ref 135–145)
Total Bilirubin: 0.4 mg/dL (ref 0.3–1.2)
Total Protein: 6.6 g/dL (ref 6.5–8.1)

## 2020-01-10 LAB — TSH: TSH: 1.581 u[IU]/mL (ref 0.308–3.960)

## 2020-01-10 MED ORDER — SODIUM CHLORIDE 0.9% FLUSH
10.0000 mL | INTRAVENOUS | Status: DC | PRN
  Administered 2020-01-10: 10 mL
  Filled 2020-01-10: qty 10

## 2020-01-10 MED ORDER — HEPARIN SOD (PORK) LOCK FLUSH 100 UNIT/ML IV SOLN
500.0000 [IU] | Freq: Once | INTRAVENOUS | Status: AC | PRN
  Administered 2020-01-10: 500 [IU]
  Filled 2020-01-10: qty 5

## 2020-01-10 MED ORDER — SODIUM CHLORIDE 0.9 % IV SOLN
Freq: Once | INTRAVENOUS | Status: AC
  Filled 2020-01-10: qty 250

## 2020-01-10 MED ORDER — SODIUM CHLORIDE 0.9% FLUSH
10.0000 mL | Freq: Once | INTRAVENOUS | Status: AC
  Administered 2020-01-10: 10 mL
  Filled 2020-01-10: qty 10

## 2020-01-10 MED ORDER — SODIUM CHLORIDE 0.9 % IV SOLN
200.0000 mg | Freq: Once | INTRAVENOUS | Status: AC
  Administered 2020-01-10: 200 mg via INTRAVENOUS
  Filled 2020-01-10: qty 8

## 2020-01-10 NOTE — Progress Notes (Signed)
Per Dr. Gorsuch, ok to treat with elevated creatinine. 

## 2020-01-10 NOTE — Progress Notes (Signed)
[place] OFFICE PROGRESS NOTE  Patient Care Team: Jonathon Jordan, MD as PCP - General (Family Medicine) Awanda Mink Craige Cotta, RN as Registered Nurse (Oncology)  ASSESSMENT & PLAN:  Endometrial cancer [facility]) Her last imaging study showed she has no evidence of disease progression Overall, the lymphadenopathy is stable She tolerated treatment very well with no side effects I recommend we proceed with treatment indefinitely I plan to repeat imaging study again at the end of the year for further follow-up  Chronic kidney disease (CKD), stage III (moderate) ([place]) Her renal function is stable We discussed the importance of risk factor modification and adequate hydration I will not order CT imaging with contrast in the future  Acquired hypothyroidism She has acquired hypothyroidism secondary to side effects of Keytruda We will be monitoring her TSH carefully and adjust the dose of Synthroid as needed  Anemia, chronic disease This is likely multifactorial related to anemia chronic disease It is not likely due to her treatment She is not symptomatic.  Observe only.     Orders Placed This Encounter  Procedures   TSH    Standing Status:   Standing    Number of Occurrences:   22    Standing Expiration Date:   01/09/2021    All questions were answered. The patient knows to call the clinic with any problems, questions or concerns. The total time spent in the appointment was 20 minutes encounter with patients including review of chart and various tests results, discussions about plan of care and coordination of care plan   [name], MD 01/10/2020 1:25 PM  INTERVAL HISTORY: Please see below for problem oriented charting. 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 Leo Grosser who obtained an endometrial biopsy showing poorly differentiated endometrial carcinoma and negative endocervical curettage   12/12/2017 Imaging   MAMMOGRAM FINDINGS: In the right axilla, a possible mass warrants further evaluation. In the left breast, no findings suspicious for malignancy.  Images were processed with CAD.  IMPRESSION: Further evaluation is suggested for possible mass in the right axilla.    12/24/2017 Imaging   Ct scan chest, abdomen and pelvis 1. Marked thickening of the endometrium (42 mm) compatible with known primary endometrial malignancy. No evidence of extrauterine invasion. 2. No pelvic or retroperitoneal adenopathy. 3. Right middle lobe 4 mm solid pulmonary nodule, for which follow-up chest CT is advised in 3-6 months. 4. Several findings that are equivocal for distant metastatic disease. Vaguely nodular heterogeneous hyperenhancement in the peripheral right liver lobe, which could represent benign transient perfusional phenomena, with underlying liver lesions not entirely excluded. Mildly sclerotic T12 vertebral lesion. Mildly enlarged right axillary lymph node. The best single test to further evaluate these findings would be a PET-CT. Alternative tests include bone scan or thoracic MRI without and with IV contrast for the T12 osseous lesion, MRI abdomen without and with IV contrast for the liver findings, and diagnostic mammographic evaluation for the right axillary node. 5.  Aortic Atherosclerosis (ICD10-I70.0).    01/09/2018 PET scan  1. Moderate hypermetabolism corresponding to enlarging axillary node since 12/22/2017. Highly suspicious for an  atypical distribution of metastatic disease. 2. No hypermetabolism to suggest hepatic or T12 osseous metastasis. 3. Hypermetabolic endometrial primary.   01/23/2018 Initial Diagnosis   Endometrial cancer ([place])   01/23/2018 Pathology Results   1. Lymph node, sentinel, biopsy, left external iliac - NO CARCINOMA IDENTIFIED IN ONE LYMPH NODE (0/1) - SEE COMMENT 2. Lymph nodes, regional resection, right para aortic - NO CARCINOMA IDENTIFIED IN FOUR LYMPH NODES (0/4) - SEE COMMENT 3. Lymph nodes, regional resection, right pelvic - NO CARCINOMA IDENTIFIED IN EIGHT LYMPH NODES (0/8) - SEE COMMENT 4. Cul-de-sac biopsy - METASTATIC CARCINOMA 5. Uterus +/- tubes/ovaries, neoplastic, cervix, bilateral fallopian tubes and ovaries UTERUS: - MIXED SEROUS AND ENDOMETRIOID CARCINOMA - SEROSAL IMPLANTS PRESENT - LYMPHOVASCULAR SPACE INVASION PRESENT - LEIOMYOMATA (1.5 CM; LARGEST) - SEE ONCOLOGY TABLE AND COMMENT BELOW CERVIX: - BENIGN NABOTHIAN CYSTS - NO CARCINOMA IDENTIFIED BILATERAL OVARIES: - METASTATIC CARCINOMA PRESENT ON OVARIAN SURFACE BILATERAL FALLOPIAN TUBES: - INTRALUMINAL CARCINOMA Microscopic Comment 1. -3. Cytokeratin AE1/3 was performed on the sentinel lymph nodes to exclude micrometastasis. There is no evidence of metastatic carcinoma by immunohistochemistry. 5. 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%) Histologic Grade: N/A Myometrial invasion: Estimated less than 50% myometrial invasion (0.3 cm of myometrium involved; 1.4 cm measured thickness) Uterine Serosa Involvement: Present Cervical stromal involvement: Not identified Extent of involvement of other organs: - Fallopian tube (left within the lumen) - Ovary, left (surface involvement) -  Cul-de-sac Lymphovascular invasion: Present Regional Lymph Nodes: Examined: 1 Sentinel 12 Non-sentinel 13 Total Lymph nodes with metastasis: 0 Isolated tumor cells (< 0.2 mm): 0 Micrometastasis: (> 0.2 mm and < 2.0 mm): 0 Macrometastasis: (> 2.0 mm): 0 Extracapsular extension: N/A Tumor block for ancillary studies: 5E, 5B MMR / MSI testing: Pending will be reported separately Pathologic Stage Classification (pTNM, AJCC 8th edition): pT3a, pN0 FIGO Stage: IIIA COMMENT: There is tumor present on the surface of the left ovary and within the lumen of the left fallopian tube. The carcinoma appears to be mixed with the largest component being high grade serous carcinoma. Dr. Lyndon Code reviewed the case and agrees with the above diagnosis.   01/23/2018 Surgery   Surgeon: Donaciano Eva   Operation: Robotic-assisted laparoscopic total hysterectomy with bilateral salpingoophorectomy, SLN injection, mapping and biopsy, right pelvic and para-aortic lymphadenectomy  Operative Findings:  : 10-12cm bulky uterus with frank serosal involvement on posterior cul de sac peritoneum and anterior peritoneum which adhesed the bladder to the anterior uterus. Unilateral mapping on left pelvis. No grossly suspicious nodes. Normal omentum and diaphragms.    02/01/2018 Pathology Results   Lymph node, needle/core biopsy, right axilla - METASTATIC CARCINOMA - SEE COMMENT Microscopic Comment The neoplastic cells are positive for cytokeratin 7 and Pax-8 but negative for cytokeratin 5/6, cytokeratin 20, Gata-3, p63, p53 and GCDFP. Overall, the immunoprofile is consistent with metastasis from the patient's known gynecologic carcinoma.   02/01/2018 Procedure   Ultrasound-guided core biopsies of a suspicious right axillary lymph node.   02/21/2018 - 06/13/2018 Chemotherapy   The patient had carboplatin & Taxol x 6   03/26/2018 - 04/30/2018 Radiation Therapy   Radiation treatment dates:   03/26/18, 04/02/18, 04/19/18,  04/23/18 04/30/18  Site/dose: proximal Vagina, 6 Gy in 5 fractions for a total dose of  30 Gy    04/26/2018 PET scan   1. Interval resection of the hypermetabolic endometrial primary. No discernible hypermetabolic pelvic sidewall or peritoneal metastases. 2. Stable hypermetabolic bilateral axillary lymphadenopathy. The degree of hypermetabolism in these lymph nodes remains highly suspicious for neoplasm. 3. Interval development of low level FDG uptake in 2 stable, small lymph nodes in the left groin region. This may be reactive, but close attention recommended to exclude metastatic involvement. 4. Stable non hypermetabolic low-density liver lesions and the mixed lucent and sclerotic T12 lesion is stable without hypermetabolism today.   07/09/2018 Imaging   Status post hysterectomy.  Mild axillary lymphadenopathy, improved. Nodal metastases not excluded.  Irregular wall thickening involving the anterior bladder, possibly reflecting radiation cystitis versus tumor.  Additional stable findings as above, including a 5 mm right middle lobe nodule and stable sclerosis involving the T12 vertebral body.   10/10/2018 Imaging   1. Stable exam. No findings within the abdomen or pelvis to suggest metastatic disease. 2. Unchanged appearance of sclerosis involving the T12 vertebra. 3.  Aortic Atherosclerosis (ICD10-I70.0).   10/10/2018 Imaging   Ct abdomen and pelvis 1. Stable exam. No findings within the abdomen or pelvis to suggest metastatic disease. 2. Unchanged appearance of sclerosis involving the T12 vertebra. 3.  Aortic Atherosclerosis (ICD10-I70.0).   01/16/2019 PET scan   1. Enlargement and increased activity in the left axillary, right axillary, and left inguinal adenopathy compatible with progressive malignancy. 2. Stable 4 mm right middle lobe subpleural nodule, not appreciably hypermetabolic. 3. Other imaging findings of potential clinical significance: Right kidney lower pole cyst.  Aortic Atherosclerosis (ICD10-I70.0). Prominent stool throughout the colon favors constipation. Mild chronic left maxillary sinusitis.     02/01/2019 -  Chemotherapy   The patient had pembrolizumab and Lenvima for chemotherapy treatment.     04/25/2019 Imaging   Ct imaging 1. Interval response to therapy. Decrease in size of bilateral axillary and left inguinal lymph nodes. No new or progressive findings identified. 2. Stable sclerotic appearance of the T12 vertebra. 3. Aortic atherosclerosis. Lad coronary artery calcification noted.   08/30/2019 Imaging   1. Bulky RIGHT hilar lymph node, slightly increased in size from previous imaging. 2. Multiple foci of hyperenhancement in the liver. 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 Consider MRI liver on follow-up to establish a baseline for number of lesions as these will appear variable on CT follow-up based on phase of contrast acquisition in the future. 3. Stable 5 mm nodule adjacent to the fissure, minor fissure in the RIGHT middle lobe.     11/28/2019 Imaging   1. Stable exam. No new or progressive findings. 2. Stable enlarged right axillary lymph node. 3. Stable tiny bilateral pulmonary nodules. Continued attention on follow-up recommended. 4. Scattered hypodensities in the liver parenchyma correspond to the hypervascular lesions seen on the previous study performed with intravenous contrast material. 5. Right renal cyst. 6. Aortic Atherosclerosis (ICD10-I70.0).       REVIEW OF SYSTEMS:   Constitutional: Denies fevers, chills or abnormal weight loss Eyes: Denies blurriness of vision Ears, nose, mouth, throat, and face: Denies mucositis or sore throat Respiratory: Denies cough, dyspnea or wheezes Cardiovascular: Denies palpitation,  chest discomfort or lower extremity swelling Gastrointestinal:  Denies nausea, heartburn or change in bowel habits Skin: Denies abnormal  skin rashes Lymphatics: Denies new lymphadenopathy or easy bruising Neurological:Denies numbness, tingling or new weaknesses Behavioral/Psych: Mood is stable, no new changes  All other systems were reviewed with the patient and are negative.  I have reviewed the past medical history, past surgical history, social history and family history with the patient and they are unchanged from previous note.  ALLERGIES:  is allergic to sulfa antibiotics and sulfamethoxazole.  MEDICATIONS:  Current Outpatient Medications  Medication Sig Dispense Refill   ketoconazole (NIZORAL) 2 % cream Apply to web space twice a day for 1 week then daily for 1 month 15 g 0   lenvatinib 10 mg daily dose (LENVIMA, 10 MG DAILY DOSE,) capsule Take 1 capsule (10 mg total) by mouth daily. 30 capsule 11   levothyroxine (SYNTHROID) 75 MCG tablet Take 1 tablet (75 mcg total) by mouth daily before breakfast. 30 tablet 11   lidocaine-prilocaine (EMLA) cream Apply to affected area once 30 g 3   lisinopril-hydrochlorothiazide (PRINZIDE,ZESTORETIC) 20-12.5 MG tablet Take 1 tablet by mouth daily. (Patient taking differently: Take 1 tablet by mouth every morning. ) 30 tablet 11   NOVOLIN 70/30 FLEXPEN RELION (70-30) 100 UNIT/ML KwikPen INJECT 22 UNITS SUBCUTANEOUSLY ONCE DAILY WITH BREAKFAST 15 mL 0   ondansetron (ZOFRAN) 8 MG tablet Take 1 tablet (8 mg total) by mouth daily. 30 tablet 1   OneTouch Delica Lancets 09N MISC 1 each by Other route 2 (two) times daily. Use to monitor glucose levels BID; E11.65 100 each 2   ONETOUCH VERIO test strip 1 each by Other route 3 (three) times daily. And lancets 3/day 300 each 3   RELION PEN NEEDLES 32G X 4 MM MISC USE AS DIRECTED 50 each 0   simvastatin (ZOCOR) 10 MG tablet Take 10 mg by mouth at bedtime.      Vitamin D, Ergocalciferol, 2000 units  CAPS Take 2,000 Units by mouth daily.   0   No current facility-administered medications for this visit.    PHYSICAL EXAMINATION: ECOG PERFORMANCE STATUS: 1 - Symptomatic but completely ambulatory  Vitals:   01/10/20 1311  BP: 119/64  Pulse: 86  Resp: 18  Temp: (!) 97.2 F (36.2 C)  SpO2: 99%   Filed Weights   01/10/20 1311  Weight: 184 lb 6.4 oz (83.6 kg)    GENERAL:alert, no distress and comfortable SKIN: skin color, texture, turgor are normal, no rashes or significant lesions EYES: normal, Conjunctiva are pink and non-injected, sclera clear OROPHARYNX:no exudate, no erythema and lips, buccal mucosa, and tongue normal  NECK: supple, thyroid normal size, non-tender, without nodularity LYMPH:  no palpable lymphadenopathy in the cervical, axillary or inguinal LUNGS: clear to auscultation and percussion with normal breathing effort HEART: regular rate & rhythm and no murmurs and no lower extremity edema ABDOMEN:abdomen soft, non-tender and normal bowel sounds Musculoskeletal:no cyanosis of digits and no clubbing  NEURO: alert & oriented x 3 with fluent speech, no focal motor/sensory deficits  LABORATORY DATA:  I have reviewed the data as listed    Component Value Date/Time   NA 141 12/20/2019 0840   K 3.8 12/20/2019 0840   CL 110 12/20/2019 0840   CO2 21 (L) 12/20/2019 0840   GLUCOSE 129 (H) 12/20/2019 0840   BUN 31 (H) 12/20/2019 0840   CREATININE 1.55 (H) 12/20/2019 0840   CALCIUM 9.4 12/20/2019 0840   PROT 6.5 12/20/2019 0840   ALBUMIN 3.4 (L) 12/20/2019 0840   AST 11 (L) 12/20/2019 0840   ALT <6 12/20/2019 0840  ALKPHOS 72 12/20/2019 0840   BILITOT 0.4 12/20/2019 0840   GFRNONAA 34 (L) 12/20/2019 0840   GFRAA 39 (L) 12/20/2019 0840    No results found for: SPEP, UPEP  Lab Results  Component Value Date   WBC 4.2 01/10/2020   NEUTROABS 2.6 01/10/2020   HGB 10.6 (L) 01/10/2020   HCT 33.1 (L) 01/10/2020   MCV 87.6 01/10/2020   PLT 189 01/10/2020       Chemistry      Component Value Date/Time   NA 141 12/20/2019 0840   K 3.8 12/20/2019 0840   CL 110 12/20/2019 0840   CO2 21 (L) 12/20/2019 0840   BUN 31 (H) 12/20/2019 0840   CREATININE 1.55 (H) 12/20/2019 0840      Component Value Date/Time   CALCIUM 9.4 12/20/2019 0840   ALKPHOS 72 12/20/2019 0840   AST 11 (L) 12/20/2019 0840   ALT <6 12/20/2019 0840   BILITOT 0.4 12/20/2019 0840

## 2020-01-10 NOTE — Patient Instructions (Signed)
Cancer Center Discharge Instructions for Patients Receiving Chemotherapy  Today you received the following chemotherapy agents: pembrolizumab.  To help prevent nausea and vomiting after your treatment, we encourage you to take your nausea medication as directed.   If you develop nausea and vomiting that is not controlled by your nausea medication, call the clinic.   BELOW ARE SYMPTOMS THAT SHOULD BE REPORTED IMMEDIATELY:  *FEVER GREATER THAN 100.5 F  *CHILLS WITH OR WITHOUT FEVER  NAUSEA AND VOMITING THAT IS NOT CONTROLLED WITH YOUR NAUSEA MEDICATION  *UNUSUAL SHORTNESS OF BREATH  *UNUSUAL BRUISING OR BLEEDING  TENDERNESS IN MOUTH AND THROAT WITH OR WITHOUT PRESENCE OF ULCERS  *URINARY PROBLEMS  *BOWEL PROBLEMS  UNUSUAL RASH Items with * indicate a potential emergency and should be followed up as soon as possible.  Feel free to call the clinic should you have any questions or concerns. The clinic phone number is [phone].  Please show the CHEMO ALERT CARD at check-in to the Emergency Department and triage nurse.   

## 2020-01-10 NOTE — Telephone Encounter (Signed)
Scheduled per 9/24 sch message. No avs or calendar needed to be printed. Pt is aware of appt times and dates

## 2020-01-10 NOTE — Assessment & Plan Note (Signed)
Her renal function is stable We discussed the importance of risk factor modification and adequate hydration I will not order CT imaging with contrast in the future 

## 2020-01-10 NOTE — Assessment & Plan Note (Signed)
This is likely multifactorial related to anemia chronic disease It is not likely due to her treatment She is not symptomatic.  Observe only.   

## 2020-01-10 NOTE — Assessment & Plan Note (Signed)
She has acquired hypothyroidism secondary to side effects of Keytruda We will be monitoring her TSH carefully and adjust the dose of Synthroid as needed 

## 2020-01-10 NOTE — Assessment & Plan Note (Signed)
Her last imaging study showed she has no evidence of disease progression Overall, the lymphadenopathy is stable She tolerated treatment very well with no side effects I recommend we proceed with treatment indefinitely I plan to repeat imaging study again at the end of the year for further follow-up 

## 2020-01-11 LAB — T4: T4, Total: 8.3 ug/dL (ref 4.5–12.0)

## 2020-01-12 NOTE — Progress Notes (Signed)
Subjective: Krista Johnson presents today at risk foot care. Pt has h/o NIDDM with chronic kidney disease and painful mycotic nails b/l that are difficult to trim. Pain interferes with ambulation. Aggravating factors include wearing enclosed shoe gear. Pain is relieved with periodic professional debridement.   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  Krista Johnson voices no new pedal concerns on today's visit.   She states her Mom will be coming to see me on next week.   Jonathon Jordan, MD is patient's PCP. Dr. Renato Shin is her Endocrinologist. Last visit was 10/30/2019.  Past Medical History:  Diagnosis Date  . Arthritis    right knee--- last cortisone injection 04/ 2019  . CKD (chronic kidney disease)   . Diabetes mellitus without complication [facility])    endocrinologist-  dr Loanne Drilling  . Endometrial cancer ([place])   . History of colon polyps   . Hyperlipidemia   . Hyperparathyroidism ([place])   . Hypertension      Patient Active Problem List   Diagnosis Date Noted  . Multiple lung nodules on CT 11/29/2019  . Hyperparathyroidism ([place]) 05/29/2019  . Acquired hypothyroidism 04/05/2019  . Diabetes ([place]) 04/02/2019  . Essential hypertension 02/22/2019  . Lymphadenopathy, axillary 01/07/2019  . Lymphadenopathy, inguinal 01/07/2019  . Dysuria 07/10/2018  . Anemia, chronic disease 05/01/2018  . Pancytopenia, acquired ([place]) 03/19/2018  . Other constipation 03/19/2018  . Chronic kidney disease (CKD), stage III (moderate) 02/09/2018  . Goals of care, counseling/discussion 02/09/2018  . 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 Endometrial cancer ([place]) 01/23/2018  . Hypercalcemia 12/26/2017  . Primary osteoarthritis of right knee 07/27/2017  . Chronic pain of both knees 09/20/2016    Current Outpatient Medications on File Prior to Visit  Medication Sig Dispense Refill  . ketoconazole (NIZORAL) 2 % cream Apply to web space twice a day  for 1 week then daily for 1 month 15 g 0  . lenvatinib 10 mg daily dose (LENVIMA, 10 MG DAILY DOSE,) capsule Take 1 capsule (10 mg total) by mouth daily. 30 capsule 11  . levothyroxine (SYNTHROID) 75 MCG tablet Take 1 tablet (75 mcg total) by mouth daily before breakfast. 30 tablet 11  . lidocaine-prilocaine (EMLA) cream Apply to affected area once 30 g 3  . lisinopril-hydrochlorothiazide (PRINZIDE,ZESTORETIC) 20-12.5 MG tablet Take 1 tablet by mouth daily. (Patient taking differently: Take 1 tablet by mouth every morning. ) 30 tablet 11  . NOVOLIN 70/30 FLEXPEN RELION (70-30) 100 UNIT/ML KwikPen INJECT 22 UNITS SUBCUTANEOUSLY ONCE DAILY WITH BREAKFAST 15 mL 0  . ondansetron (ZOFRAN) 8 MG tablet Take 1 tablet (8 mg total) by mouth daily. 30 tablet 1  . OneTouch Delica Lancets 26J MISC 1 each by Other route 2 (two) times daily. Use to monitor glucose levels BID; E11.65 100 each 2  . ONETOUCH VERIO test strip 1 each by Other route 3 (three) times daily. And lancets 3/day 300 each 3  . RELION PEN NEEDLES 32G X 4 MM MISC USE AS DIRECTED 50 each 0  . simvastatin (ZOCOR) 10 MG tablet Take 10 mg by mouth at bedtime.     . Vitamin D, Ergocalciferol, 2000 units CAPS Take 2,000 Units by mouth daily.   0   No current facility-administered medications on file prior to visit.     Allergies  Allergen Reactions  . Sulfa Antibiotics Nausea Only  . Sulfamethoxazole Nausea Only    Objective: Krista Johnson is a pleasant 69  y.o. [place] American female in NAD. AAO x 3.  There were no vitals filed for this visit.  Vascular Examination: Neurovascular status unchanged b/l lower extremities. Capillary refill time to digits immediate b/l. Palpable pedal pulses b/l LE. Pedal hair sparse. Lower extremity skin temperature gradient within normal limits. No pain with calf compression b/l.  Dermatological Examination: Pedal skin with normal turgor, texture and tone bilaterally. No open wounds bilaterally. No  interdigital macerations bilaterally. Toenails 1-5 b/l elongated, discolored, dystrophic, thickened, crumbly with subungual debris and tenderness to dorsal palpation.  Musculoskeletal: Normal muscle strength 5/5 to all lower extremity muscle groups bilaterally. No pain crepitus or joint limitation noted with ROM b/l. No gross bony deformities bilaterally.  Neurological Examination: Protective sensation intact 5/5 intact bilaterally with 10g monofilament b/l. Vibratory sensation intact b/l. Proprioception intact bilaterally. Babinski reflex negative b/l. Clonus negative b/l.  Last A1c: Hemoglobin A1C Latest Ref Rng & Units 10/30/2019 07/29/2019 05/29/2019 04/02/2019  HGBA1C 4.0 - 5.6 % 6.0(A) 6.2(A) 7.2(A) 7.4(A)  Some recent data might be hidden   Assessment: 1. Pain due to onychomycosis of toenails of both feet   2. Chemotherapy-induced neuropathy ([place])   3. Diabetes mellitus due to underlying condition with stage 3 chronic kidney disease, with long-term current use of insulin, unspecified whether stage 3a or 3b CKD ([place])    Plan: -Examined patient. -Continue diabetic foot care principles. -Toenails 1-5 b/l were debrided in length and girth with sterile nail nippers and dremel without iatrogenic bleeding.  -Patient to report any pedal injuries to medical professional immediately. -Patient to continue soft, supportive shoe gear daily. -Patient/POA to call should there be question/concern in the interim.  Return in about 3 months (around 04/07/2020).  Marzetta Board, DPM

## 2020-01-21 ENCOUNTER — Encounter: Payer: Self-pay | Admitting: Endocrinology

## 2020-01-21 ENCOUNTER — Other Ambulatory Visit: Payer: Self-pay

## 2020-01-21 ENCOUNTER — Ambulatory Visit (INDEPENDENT_AMBULATORY_CARE_PROVIDER_SITE_OTHER): Payer: Medicare Other | Admitting: Endocrinology

## 2020-01-21 VITALS — BP 114/62 | Ht 66.0 in | Wt 182.6 lb

## 2020-01-21 DIAGNOSIS — Z794 Long term (current) use of insulin: Secondary | ICD-10-CM

## 2020-01-21 DIAGNOSIS — E1122 Type 2 diabetes mellitus with diabetic chronic kidney disease: Secondary | ICD-10-CM

## 2020-01-21 DIAGNOSIS — N1831 Chronic kidney disease, stage 3a: Secondary | ICD-10-CM

## 2020-01-21 LAB — POCT GLYCOSYLATED HEMOGLOBIN (HGB A1C): Hemoglobin A1C: 5.8 % — AB (ref 4.0–5.6)

## 2020-01-21 LAB — LIPID PANEL
Cholesterol: 150 mg/dL (ref 0–200)
HDL: 36.5 mg/dL — ABNORMAL LOW (ref >39.00)
LDL Cholesterol: 83 mg/dL (ref 0–99)
NonHDL: 113.56
Total CHOL/HDL Ratio: 4
Triglycerides: 154 mg/dL — ABNORMAL HIGH (ref 0.0–149.0)
VLDL: 30.8 mg/dL (ref 0.0–40.0)

## 2020-01-21 LAB — VITAMIN D 25 HYDROXY (VIT D DEFICIENCY, FRACTURES): VITD: 30.03 ng/mL (ref 30.00–100.00)

## 2020-01-21 MED ORDER — NOVOLIN 70/30 FLEXPEN RELION (70-30) 100 UNIT/ML ~~LOC~~ SUPN: 14.0000 [IU] | PEN_INJECTOR | Freq: Every day | SUBCUTANEOUS | 3 refills | Status: DC

## 2020-01-21 NOTE — Patient Instructions (Addendum)
check your blood sugar 3 times a day.  vary the time of day when you check, between before the 3 meals, and at bedtime.  also check if you have symptoms of your blood sugar being too high or too low.  please keep a record of the readings and bring it to your next appointment here (or you can bring the meter itself).  You can write it on any piece of paper.  please call us sooner if your blood sugar goes below 70, or if you have a lot of readings over 200. On this type of insulin schedule, you should eat meals on a regular schedule (especially lunch).  If a meal is missed or significantly delayed, your blood sugar could go low.   Please reduce the insulin to 14 units with breakfast.  Please come back for a follow-up appointment in 3 months.

## 2020-01-21 NOTE — Progress Notes (Signed)
Subjective:    Patient ID: Krista Johnson, female    DOB: [birth year], 69 y.o.   MRN: [id number]  HPI Pt returns for f/u of diabetes mellitus:  DM type: Insulin-requiring type 2.   Dx'ed: 7824 Complications: stage 3b CRI Therapy: insulin since mid-2019 GDM: never (G0) DKA: never Severe hypoglycemia: never Pancreatitis: never Pancreatic imaging: normal on 2019 CT.   Other: she takes qd insulin, after poor results with multiple daily injections; She is finished with her IV chemo; she takes NPH, then 70/30, due to the pattern of her cbg's; fructosamine has confirmed A1c.   Interval history:she brings her meter with her cbg's which I have reviewed today.  cbg varies from 45-127.  There is no trend throughout the day.    Pt also has vit-D def and Hyperparathyroidism (prob a combination of primary and secondary).  She takes vit-D, 2000 units/day.  No recent steroids.  Past Medical History:  Diagnosis Date  . Arthritis    right knee--- last cortisone injection 04/ 2019  . CKD (chronic kidney disease)   . Diabetes mellitus without complication [facility])    endocrinologist-  dr Loanne Drilling  . Endometrial cancer ([place])   . History of colon polyps   . Hyperlipidemia   . Hyperparathyroidism ([place])   . Hypertension     Past Surgical History:  Procedure Laterality Date  . COLONOSCOPY  last one 12-25-2017  . IR IMAGING GUIDED PORT INSERTION  02/20/2018  . ROBOTIC ASSISTED TOTAL HYSTERECTOMY WITH BILATERAL SALPINGO OOPHERECTOMY N/A 01/23/2018   Procedure: XI ROBOTIC ASSISTED TOTAL HYSTERECTOMY WITH BILATERAL SALPINGO OOPHORECTOMY;  Surgeon: Everitt Amber, MD;  Location: [facility];  Service: Gynecology;  Laterality: N/A;  . SENTINEL NODE BIOPSY N/A 01/23/2018   Procedure: SENTINEL NODE BIOPSY;  Surgeon: Everitt Amber, MD;  Location: [facility];  Service: Gynecology;  Laterality: N/A;    Social History   Socioeconomic History  . Marital status: Single    Spouse name: Not on file  . Number of children: 0  .  Years of education: Not on file  . Highest education level: Not on file  Occupational History  . Occupation: retired Education officer, [employer]  Tobacco Use  . Smoking status: Never Smoker  . Smokeless tobacco: Never Used  Vaping Use  . Vaping Use: Never used  Substance and Sexual Activity  . Alcohol use: Not Currently    Alcohol/week: 0.0 standard drinks  . Drug use: Never  . Sexual activity: Not on file  Other Topics Concern  . Not on file  Social History Narrative  . Not on file   Social Determinants of Health   Financial Resource Strain:   . Difficulty of Paying Living Expenses: Not on file  Food Insecurity:   . Worried About Charity fundraiser in the Last Year: Not on file  . Ran Out of Food in the Last Year: Not on file  Transportation Needs:   . Lack of Transportation (Medical): Not on file  . Lack of Transportation (Non-Medical): Not on file  Physical Activity:   . Days of Exercise per Week: Not on file  . Minutes of Exercise per Session: Not on file  Stress:   . Feeling of Stress : Not on file  Social Connections:   . Frequency of Communication with Friends and Family: Not on file  . Frequency of Social Gatherings with Friends and Family: Not on file  . Attends Religious Services: Not on file  . Active Member of Clubs or Organizations:  Not on file  . Attends Archivist Meetings: Not on file  . Marital Status: Not on file  Intimate Partner Violence:   . Fear of Current or Ex-Partner: Not on file  . Emotionally Abused: Not on file  . Physically Abused: Not on file  . Sexually Abused: Not on file    Current Outpatient Medications on File Prior to Visit  Medication Sig Dispense Refill  . ketoconazole (NIZORAL) 2 % cream Apply to web space twice a day for 1 week then daily for 1 month 15 g 0  . lenvatinib 10 mg daily dose (LENVIMA, 10 MG DAILY DOSE,) capsule Take 1 capsule (10 mg total) by mouth daily. 30 capsule 11  . levothyroxine (SYNTHROID) 75 MCG tablet Take  1 tablet (75 mcg total) by mouth daily before breakfast. 30 tablet 11  . lidocaine-prilocaine (EMLA) cream Apply to affected area once 30 g 3  . lisinopril-hydrochlorothiazide (PRINZIDE,ZESTORETIC) 20-12.5 MG tablet Take 1 tablet by mouth daily. (Patient taking differently: Take 1 tablet by mouth every morning. ) 30 tablet 11  . ondansetron (ZOFRAN) 8 MG tablet Take 1 tablet (8 mg total) by mouth daily. 30 tablet 1  . OneTouch Delica Lancets 38G MISC 1 each by Other route 2 (two) times daily. Use to monitor glucose levels BID; E11.65 100 each 2  . ONETOUCH VERIO test strip 1 each by Other route 3 (three) times daily. And lancets 3/day 300 each 3  . RELION PEN NEEDLES 32G X 4 MM MISC USE AS DIRECTED 50 each 0  . simvastatin (ZOCOR) 10 MG tablet Take 10 mg by mouth at bedtime.     . Vitamin D, Ergocalciferol, 2000 units CAPS Take 2,000 Units by mouth daily.   0   No current facility-administered medications on file prior to visit.    Allergies  Allergen Reactions  . Sulfa Antibiotics Nausea Only  . Sulfamethoxazole Nausea Only    Family History  Problem Relation Age of Onset  . Diabetes Mother   . Hypertension Mother   . Diabetes Sister   . Hypertension Sister   . Diabetes Maternal Uncle   . Colon cancer Paternal Aunt   . Diabetes Paternal Aunt   . Stomach cancer Neg Hx   . Rectal cancer Neg Hx   . Esophageal cancer Neg Hx   . Colon polyps Neg Hx     BP 114/62   Ht 5\' 6"  (1.676 m)   Wt 182 lb 9.6 oz (82.8 kg)   BMI 29.47 kg/m    Review of Systems     Objective:   Physical Exam VITAL SIGNS:  See vs page GENERAL: no distress Pulses: dorsalis pedis intact bilat.   MSK: no deformity of the feet CV: no leg edema Skin:  no ulcer on the feet.  normal color and temp on the feet. Neuro: sensation is intact to touch on the feet  Lab Results  Component Value Date   CREATININE 1.75 (H) 01/10/2020   BUN 34 (H) 01/10/2020   NA 139 01/10/2020   K 3.9 01/10/2020   CL 110  01/10/2020   CO2 24 01/10/2020     Lab Results  Component Value Date   HGBA1C 6.0 (A) 10/30/2019       Assessment & Plan:  Type 2 DM, with stage 3b CRI.  Hypoglycemia, due to insulin: this limits aggressiveness of glycemic control.    Patient Instructions  check your blood sugar 3 times a day.  vary the time  of day when you check, between before the 3 meals, and at bedtime.  also check if you have symptoms of your blood sugar being too high or too low.  please keep a record of the readings and bring it to your next appointment here (or you can bring the meter itself).  You can write it on any piece of paper.  please call us sooner if your blood sugar goes below 70, or if you have a lot of readings over 200. On this type of insulin schedule, you should eat meals on a regular schedule (especially lunch).  If a meal is missed or significantly delayed, your blood sugar could go low.   Please reduce the insulin to 14 units with breakfast.  Please come back for a follow-up appointment in 3 months.

## 2020-01-22 LAB — PTH, INTACT AND CALCIUM
Calcium: 10 mg/dL (ref 8.6–10.4)
PTH: 230 pg/mL — ABNORMAL HIGH (ref 14–64)

## 2020-01-24 ENCOUNTER — Telehealth: Payer: Self-pay

## 2020-01-24 NOTE — Telephone Encounter (Signed)
-----   Message from Renato Shin, MD sent at 01/22/2020  4:40 PM EDT ----- please contact patient: The vitamin-D is borderline low.  Please increase to 4000 units/day.  I'll see you next time.

## 2020-01-24 NOTE — Telephone Encounter (Signed)
Left message for patient to call office regarding lab results and recommendations. 

## 2020-01-27 ENCOUNTER — Telehealth: Payer: Self-pay

## 2020-01-27 NOTE — Telephone Encounter (Signed)
-----   Message from Renato Shin, MD sent at 01/22/2020  4:40 PM EDT ----- please contact patient: The vitamin-D is borderline low.  Please increase to 4000 units/day.  I'll see you next time.

## 2020-01-27 NOTE — Telephone Encounter (Signed)
Patient aware of results and recommendations. °

## 2020-01-31 ENCOUNTER — Inpatient Hospital Stay: Payer: Medicare Other

## 2020-01-31 ENCOUNTER — Inpatient Hospital Stay: Payer: Medicare Other | Attending: Gynecology

## 2020-01-31 ENCOUNTER — Other Ambulatory Visit: Payer: Self-pay

## 2020-01-31 VITALS — BP 106/74 | HR 85 | Temp 97.7°F | Resp 18 | Wt 183.5 lb

## 2020-01-31 DIAGNOSIS — C773 Secondary and unspecified malignant neoplasm of axilla and upper limb lymph nodes: Secondary | ICD-10-CM | POA: Diagnosis not present

## 2020-01-31 DIAGNOSIS — C541 Malignant neoplasm of endometrium: Secondary | ICD-10-CM | POA: Diagnosis not present

## 2020-01-31 DIAGNOSIS — Z5112 Encounter for antineoplastic immunotherapy: Secondary | ICD-10-CM | POA: Diagnosis not present

## 2020-01-31 DIAGNOSIS — Z79899 Other long term (current) drug therapy: Secondary | ICD-10-CM | POA: Insufficient documentation

## 2020-01-31 DIAGNOSIS — Z7189 Other specified counseling: Secondary | ICD-10-CM

## 2020-01-31 DIAGNOSIS — E039 Hypothyroidism, unspecified: Secondary | ICD-10-CM

## 2020-01-31 LAB — COMPREHENSIVE METABOLIC PANEL
ALT: 9 U/L (ref 0–44)
AST: 15 U/L (ref 15–41)
Albumin: 3.3 g/dL — ABNORMAL LOW (ref 3.5–5.0)
Alkaline Phosphatase: 68 U/L (ref 38–126)
Anion gap: 9 (ref 5–15)
BUN: 38 mg/dL — ABNORMAL HIGH (ref 8–23)
CO2: 19 mmol/L — ABNORMAL LOW (ref 22–32)
Calcium: 9.7 mg/dL (ref 8.9–10.3)
Chloride: 112 mmol/L — ABNORMAL HIGH (ref 98–111)
Creatinine, Ser: 1.81 mg/dL — ABNORMAL HIGH (ref 0.44–1.00)
GFR, Estimated: 28 mL/min — ABNORMAL LOW (ref >60)
Glucose, Bld: 103 mg/dL — ABNORMAL HIGH (ref 70–99)
Potassium: 4 mmol/L (ref 3.5–5.1)
Sodium: 140 mmol/L (ref 135–145)
Total Bilirubin: 0.3 mg/dL (ref 0.3–1.2)
Total Protein: 6.4 g/dL — ABNORMAL LOW (ref 6.5–8.1)

## 2020-01-31 LAB — CBC WITH DIFFERENTIAL/PLATELET
Abs Immature Granulocytes: 0.02 10*3/uL (ref 0.00–0.07)
Basophils Absolute: 0 10*3/uL (ref 0.0–0.1)
Basophils Relative: 0 %
Eosinophils Absolute: 0 10*3/uL (ref 0.0–0.5)
Eosinophils Relative: 1 %
HCT: 31.3 % — ABNORMAL LOW (ref 36.0–46.0)
Hemoglobin: 10.2 g/dL — ABNORMAL LOW (ref 12.0–15.0)
Immature Granulocytes: 0 %
Lymphocytes Relative: 28 %
Lymphs Abs: 1.3 10*3/uL (ref 0.7–4.0)
MCH: 29.1 pg (ref 26.0–34.0)
MCHC: 32.6 g/dL (ref 30.0–36.0)
MCV: 89.2 fL (ref 80.0–100.0)
Monocytes Absolute: 0.3 10*3/uL (ref 0.1–1.0)
Monocytes Relative: 6 %
Neutro Abs: 3.1 10*3/uL (ref 1.7–7.7)
Neutrophils Relative %: 65 %
Platelets: 183 10*3/uL (ref 150–400)
RBC: 3.51 MIL/uL — ABNORMAL LOW (ref 3.87–5.11)
RDW: 13.8 % (ref 11.5–15.5)
WBC: 4.8 10*3/uL (ref 4.0–10.5)
nRBC: 0 % (ref 0.0–0.2)

## 2020-01-31 LAB — TSH: TSH: 2.638 u[IU]/mL (ref 0.308–3.960)

## 2020-01-31 MED ORDER — SODIUM CHLORIDE 0.9% FLUSH
10.0000 mL | INTRAVENOUS | Status: DC | PRN
  Administered 2020-01-31: 10 mL
  Filled 2020-01-31: qty 10

## 2020-01-31 MED ORDER — SODIUM CHLORIDE 0.9 % IV SOLN
Freq: Once | INTRAVENOUS | Status: AC
  Filled 2020-01-31: qty 250

## 2020-01-31 MED ORDER — HEPARIN SOD (PORK) LOCK FLUSH 100 UNIT/ML IV SOLN
500.0000 [IU] | Freq: Once | INTRAVENOUS | Status: AC | PRN
  Administered 2020-01-31: 500 [IU]
  Filled 2020-01-31: qty 5

## 2020-01-31 MED ORDER — SODIUM CHLORIDE 0.9% FLUSH
10.0000 mL | Freq: Once | INTRAVENOUS | Status: AC
  Administered 2020-01-31: 10 mL
  Filled 2020-01-31: qty 10

## 2020-01-31 MED ORDER — SODIUM CHLORIDE 0.9 % IV SOLN
200.0000 mg | Freq: Once | INTRAVENOUS | Status: AC
  Administered 2020-01-31: 200 mg via INTRAVENOUS
  Filled 2020-01-31: qty 8

## 2020-01-31 NOTE — Progress Notes (Signed)
Okay to treat with creat 1.81 per Dr. Alvy Bimler

## 2020-01-31 NOTE — Patient Instructions (Signed)
[facility] Discharge Instructions for Patients Receiving Chemotherapy  Today you received the following chemotherapy agents:  Keytruda.  To help prevent nausea and vomiting after your treatment, we encourage you to take your nausea medication as directed.   If you develop nausea and vomiting that is not controlled by your nausea medication, call the clinic.   BELOW ARE SYMPTOMS THAT SHOULD BE REPORTED IMMEDIATELY:  *FEVER GREATER THAN 100.5 F  *CHILLS WITH OR WITHOUT FEVER  NAUSEA AND VOMITING THAT IS NOT CONTROLLED WITH YOUR NAUSEA MEDICATION  *UNUSUAL SHORTNESS OF BREATH  *UNUSUAL BRUISING OR BLEEDING  TENDERNESS IN MOUTH AND THROAT WITH OR WITHOUT PRESENCE OF ULCERS  *URINARY PROBLEMS  *BOWEL PROBLEMS  UNUSUAL RASH Items with * indicate a potential emergency and should be followed up as soon as possible.  Feel free to call the clinic should you have any questions or concerns. The clinic phone number is [phone].  Please show the CHEMO ALERT CARD at check-in to the Emergency Department and triage nurse.    

## 2020-02-07 ENCOUNTER — Ambulatory Visit: Payer: Medicare Other | Admitting: Endocrinology

## 2020-02-08 DIAGNOSIS — Z23 Encounter for immunization: Secondary | ICD-10-CM | POA: Diagnosis not present

## 2020-02-12 ENCOUNTER — Ambulatory Visit: Payer: Medicare Other | Admitting: Endocrinology

## 2020-02-21 ENCOUNTER — Inpatient Hospital Stay: Payer: Medicare Other

## 2020-02-21 ENCOUNTER — Encounter: Payer: Self-pay | Admitting: Hematology and Oncology

## 2020-02-21 ENCOUNTER — Inpatient Hospital Stay: Payer: Medicare Other | Attending: Gynecology

## 2020-02-21 ENCOUNTER — Inpatient Hospital Stay (HOSPITAL_BASED_OUTPATIENT_CLINIC_OR_DEPARTMENT_OTHER): Payer: Medicare Other | Admitting: Hematology and Oncology

## 2020-02-21 ENCOUNTER — Other Ambulatory Visit: Payer: Self-pay

## 2020-02-21 VITALS — BP 97/72 | HR 98 | Temp 97.6°F | Resp 18 | Ht 66.0 in | Wt 181.0 lb

## 2020-02-21 DIAGNOSIS — C779 Secondary and unspecified malignant neoplasm of lymph node, unspecified: Secondary | ICD-10-CM | POA: Diagnosis not present

## 2020-02-21 DIAGNOSIS — E039 Hypothyroidism, unspecified: Secondary | ICD-10-CM

## 2020-02-21 DIAGNOSIS — D631 Anemia in chronic kidney disease: Secondary | ICD-10-CM | POA: Insufficient documentation

## 2020-02-21 DIAGNOSIS — Z923 Personal history of irradiation: Secondary | ICD-10-CM | POA: Insufficient documentation

## 2020-02-21 DIAGNOSIS — I952 Hypotension due to drugs: Secondary | ICD-10-CM

## 2020-02-21 DIAGNOSIS — Z79899 Other long term (current) drug therapy: Secondary | ICD-10-CM | POA: Diagnosis not present

## 2020-02-21 DIAGNOSIS — C773 Secondary and unspecified malignant neoplasm of axilla and upper limb lymph nodes: Secondary | ICD-10-CM | POA: Diagnosis not present

## 2020-02-21 DIAGNOSIS — N179 Acute kidney failure, unspecified: Secondary | ICD-10-CM | POA: Insufficient documentation

## 2020-02-21 DIAGNOSIS — C541 Malignant neoplasm of endometrium: Secondary | ICD-10-CM

## 2020-02-21 DIAGNOSIS — Z5112 Encounter for antineoplastic immunotherapy: Secondary | ICD-10-CM | POA: Diagnosis not present

## 2020-02-21 DIAGNOSIS — R918 Other nonspecific abnormal finding of lung field: Secondary | ICD-10-CM | POA: Diagnosis not present

## 2020-02-21 DIAGNOSIS — Z9221 Personal history of antineoplastic chemotherapy: Secondary | ICD-10-CM | POA: Diagnosis not present

## 2020-02-21 DIAGNOSIS — N183 Chronic kidney disease, stage 3 unspecified: Secondary | ICD-10-CM | POA: Insufficient documentation

## 2020-02-21 DIAGNOSIS — I129 Hypertensive chronic kidney disease with stage 1 through stage 4 chronic kidney disease, or unspecified chronic kidney disease: Secondary | ICD-10-CM | POA: Insufficient documentation

## 2020-02-21 DIAGNOSIS — D638 Anemia in other chronic diseases classified elsewhere: Secondary | ICD-10-CM | POA: Diagnosis not present

## 2020-02-21 DIAGNOSIS — Z7189 Other specified counseling: Secondary | ICD-10-CM

## 2020-02-21 LAB — COMPREHENSIVE METABOLIC PANEL
ALT: 8 U/L (ref 0–44)
AST: 15 U/L (ref 15–41)
Albumin: 3.3 g/dL — ABNORMAL LOW (ref 3.5–5.0)
Alkaline Phosphatase: 66 U/L (ref 38–126)
Anion gap: 10 (ref 5–15)
BUN: 46 mg/dL — ABNORMAL HIGH (ref 8–23)
CO2: 19 mmol/L — ABNORMAL LOW (ref 22–32)
Calcium: 9.1 mg/dL (ref 8.9–10.3)
Chloride: 111 mmol/L (ref 98–111)
Creatinine, Ser: 1.9 mg/dL — ABNORMAL HIGH (ref 0.44–1.00)
GFR, Estimated: 28 mL/min — ABNORMAL LOW (ref >60)
Glucose, Bld: 192 mg/dL — ABNORMAL HIGH (ref 70–99)
Potassium: 3.6 mmol/L (ref 3.5–5.1)
Sodium: 140 mmol/L (ref 135–145)
Total Bilirubin: 0.4 mg/dL (ref 0.3–1.2)
Total Protein: 6.3 g/dL — ABNORMAL LOW (ref 6.5–8.1)

## 2020-02-21 LAB — CBC WITH DIFFERENTIAL/PLATELET
Abs Immature Granulocytes: 0.02 10*3/uL (ref 0.00–0.07)
Basophils Absolute: 0 10*3/uL (ref 0.0–0.1)
Basophils Relative: 0 %
Eosinophils Absolute: 0 10*3/uL (ref 0.0–0.5)
Eosinophils Relative: 0 %
HCT: 32.3 % — ABNORMAL LOW (ref 36.0–46.0)
Hemoglobin: 10.4 g/dL — ABNORMAL LOW (ref 12.0–15.0)
Immature Granulocytes: 0 %
Lymphocytes Relative: 31 %
Lymphs Abs: 1.7 10*3/uL (ref 0.7–4.0)
MCH: 28.3 pg (ref 26.0–34.0)
MCHC: 32.2 g/dL (ref 30.0–36.0)
MCV: 88 fL (ref 80.0–100.0)
Monocytes Absolute: 0.3 10*3/uL (ref 0.1–1.0)
Monocytes Relative: 6 %
Neutro Abs: 3.3 10*3/uL (ref 1.7–7.7)
Neutrophils Relative %: 63 %
Platelets: 198 10*3/uL (ref 150–400)
RBC: 3.67 MIL/uL — ABNORMAL LOW (ref 3.87–5.11)
RDW: 13.5 % (ref 11.5–15.5)
WBC: 5.4 10*3/uL (ref 4.0–10.5)
nRBC: 0 % (ref 0.0–0.2)

## 2020-02-21 LAB — TSH: TSH: 4.357 u[IU]/mL — ABNORMAL HIGH (ref 0.308–3.960)

## 2020-02-21 MED ORDER — SODIUM CHLORIDE 0.9% FLUSH
10.0000 mL | INTRAVENOUS | Status: DC | PRN
  Administered 2020-02-21: 10 mL
  Filled 2020-02-21: qty 10

## 2020-02-21 MED ORDER — HEPARIN SOD (PORK) LOCK FLUSH 100 UNIT/ML IV SOLN
500.0000 [IU] | Freq: Once | INTRAVENOUS | Status: AC | PRN
  Administered 2020-02-21: 500 [IU]
  Filled 2020-02-21: qty 5

## 2020-02-21 MED ORDER — SODIUM CHLORIDE 0.9 % IV SOLN
Freq: Once | INTRAVENOUS | Status: AC
  Filled 2020-02-21: qty 250

## 2020-02-21 MED ORDER — SODIUM CHLORIDE 0.9% FLUSH
10.0000 mL | Freq: Once | INTRAVENOUS | Status: AC
  Administered 2020-02-21: 10 mL
  Filled 2020-02-21: qty 10

## 2020-02-21 MED ORDER — SODIUM CHLORIDE 0.9 % IV SOLN
200.0000 mg | Freq: Once | INTRAVENOUS | Status: AC
  Administered 2020-02-21: 200 mg via INTRAVENOUS
  Filled 2020-02-21: qty 8

## 2020-02-21 NOTE — Progress Notes (Signed)
Per Dr. Alvy Bimler - okay to treat with elevated creatinine levels.

## 2020-02-21 NOTE — Assessment & Plan Note (Signed)
I recommend reducing BP pill to 1/2 dose

## 2020-02-21 NOTE — Patient Instructions (Signed)
[facility] Discharge Instructions for Patients Receiving Chemotherapy  Today you received the following chemotherapy agents :  Keytruda.  To help prevent nausea and vomiting after your treatment, we encourage you to take your nausea medication as prescribed.   If you develop nausea and vomiting that is not controlled by your nausea medication, call the clinic.   BELOW ARE SYMPTOMS THAT SHOULD BE REPORTED IMMEDIATELY:  *FEVER GREATER THAN 100.5 F  *CHILLS WITH OR WITHOUT FEVER  NAUSEA AND VOMITING THAT IS NOT CONTROLLED WITH YOUR NAUSEA MEDICATION  *UNUSUAL SHORTNESS OF BREATH  *UNUSUAL BRUISING OR BLEEDING  TENDERNESS IN MOUTH AND THROAT WITH OR WITHOUT PRESENCE OF ULCERS  *URINARY PROBLEMS  *BOWEL PROBLEMS  UNUSUAL RASH Items with * indicate a potential emergency and should be followed up as soon as possible.  Feel free to call the clinic should you have any questions or concerns. The clinic phone number is [phone].  Please show the CHEMO ALERT CARD at check-in to the Emergency Department and triage nurse.  

## 2020-02-21 NOTE — Progress Notes (Signed)
[facility] OFFICE PROGRESS NOTE  Patient Care Team: Jonathon Jordan, MD as PCP - General (Family Medicine) Awanda Mink Craige Cotta, RN as Registered Nurse (Oncology)  ASSESSMENT & PLAN:  Endometrial cancer [facility]) Her last imaging study showed she has no evidence of disease progression Overall, the lymphadenopathy is stable She tolerated treatment very well with no side effects I recommend we proceed with treatment indefinitely I plan to repeat imaging study again at the end of the year for further follow-up  Acquired hypothyroidism She has acquired hypothyroidism secondary to side effects of Keytruda We will be monitoring her TSH carefully and adjust the dose of Synthroid as needed  Anemia, chronic disease This is likely multifactorial related to anemia chronic disease It is not likely due to her treatment She is not symptomatic.  Observe only.    Chronic kidney disease (CKD), stage III (moderate) (HCC) Her renal function is stable We discussed the importance of risk factor modification and adequate hydration I will not order CT imaging with contrast in the future  Metastasis to lymph nodes (HCC) The lymphadenopathy is stable We will repeat imaging study in a few months  Multiple lung nodules on CT These are stable and likely benign in nature Observe closely with serial imaging study as above  Hypotension due to drugs I recommend reducing BP pill to 1/2 dose   Orders Placed This Encounter  Procedures  . CT CHEST WO CONTRAST    Standing Status:   Future    Standing Expiration Date:   02/20/2021    Order Specific Question:   Preferred imaging location?    Answer:   [facility]    Order Specific Question:   Radiology Contrast Protocol - do NOT remove file path    Answer:   [email or web address]  . CT Abdomen Pelvis Wo Contrast    Standing Status:   Future    Standing Expiration Date:   02/20/2021    Order Specific  Question:   Preferred imaging location?    Answer:   [facility]    Order Specific Question:   Is Oral Contrast requested for this exam?    Answer:   Yes, Per Radiology protocol    All questions were answered. The patient knows to call the clinic with any problems, questions or concerns. The total time spent in the appointment was 40 minutes encounter with patients including review of chart and various tests results, discussions about plan of care and coordination of care plan   [name], MD 02/21/2020 10:04 AM  INTERVAL HISTORY: Please see below for problem oriented charting. 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 Leo Grosser who obtained an endometrial biopsy showing poorly differentiated endometrial carcinoma and negative endocervical curettage   12/12/2017 Imaging   MAMMOGRAM FINDINGS: In the right axilla, a possible mass warrants further evaluation. In the left breast, no findings suspicious for malignancy.  Images were processed with CAD.  IMPRESSION: Further evaluation is  suggested for possible mass in the right axilla.    12/24/2017 Imaging   Ct scan chest, abdomen and pelvis 1. Marked thickening of the endometrium (42 mm) compatible with known primary endometrial malignancy. No evidence of extrauterine invasion. 2. No pelvic or retroperitoneal adenopathy. 3. Right middle lobe 4 mm solid pulmonary  nodule, for which follow-up chest CT is advised in 3-6 months. 4. Several findings that are equivocal for distant metastatic disease. Vaguely nodular heterogeneous hyperenhancement in the peripheral right liver lobe, which could represent benign transient perfusional phenomena, with underlying liver lesions not entirely excluded. Mildly sclerotic T12 vertebral lesion. Mildly enlarged right axillary lymph node. The best single test to further evaluate these findings would be a PET-CT. Alternative tests include bone scan or thoracic MRI without and with IV contrast for the T12 osseous lesion, MRI abdomen without and with IV contrast for the liver findings, and diagnostic mammographic evaluation for the right axillary node. 5.  Aortic Atherosclerosis (ICD10-I70.0).    01/09/2018 PET scan   1. Moderate hypermetabolism corresponding to enlarging axillary node since 12/22/2017. Highly suspicious for an atypical distribution of metastatic disease. 2. No hypermetabolism to suggest hepatic or T12 osseous metastasis. 3. Hypermetabolic endometrial primary.   01/23/2018 Initial Diagnosis   Endometrial cancer ([place])   01/23/2018 Pathology Results   1. Lymph node, sentinel, biopsy, left external iliac - NO CARCINOMA IDENTIFIED IN ONE LYMPH NODE (0/1) - SEE COMMENT 2. Lymph nodes, regional resection, right para aortic - NO CARCINOMA IDENTIFIED IN FOUR LYMPH NODES (0/4) - SEE COMMENT 3. Lymph nodes, regional resection, right pelvic - NO CARCINOMA IDENTIFIED IN EIGHT LYMPH NODES (0/8) - SEE COMMENT 4. Cul-de-sac biopsy - METASTATIC CARCINOMA 5. Uterus +/- tubes/ovaries, neoplastic, cervix, bilateral fallopian tubes and ovaries UTERUS: - MIXED SEROUS AND ENDOMETRIOID CARCINOMA - SEROSAL IMPLANTS PRESENT - LYMPHOVASCULAR SPACE INVASION PRESENT - LEIOMYOMATA (1.5 CM; LARGEST) - SEE ONCOLOGY TABLE AND COMMENT BELOW CERVIX: - BENIGN NABOTHIAN CYSTS - NO CARCINOMA IDENTIFIED BILATERAL OVARIES: - METASTATIC  CARCINOMA PRESENT ON OVARIAN SURFACE BILATERAL FALLOPIAN TUBES: - INTRALUMINAL CARCINOMA Microscopic Comment 1. -3. Cytokeratin AE1/3 was performed on the sentinel lymph nodes to exclude micrometastasis. There is no evidence of metastatic carcinoma by immunohistochemistry. 5. 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%) Histologic Grade: N/A Myometrial invasion: Estimated less than 50% myometrial invasion (0.3 cm of myometrium involved; 1.4 cm measured thickness) Uterine Serosa Involvement: Present Cervical stromal involvement: Not identified Extent of involvement of other organs: - Fallopian tube (left within the lumen) - Ovary, left (surface involvement) - Cul-de-sac Lymphovascular invasion: Present Regional Lymph Nodes: Examined: 1 Sentinel 12 Non-sentinel 13 Total Lymph nodes with metastasis: 0 Isolated tumor cells (< 0.2 mm): 0 Micrometastasis: (> 0.2 mm and < 2.0 mm): 0 Macrometastasis: (> 2.0 mm): 0 Extracapsular extension: N/A Tumor block for ancillary studies: 5E, 5B MMR / MSI testing: Pending will be reported separately Pathologic Stage Classification (pTNM, AJCC 8th edition): pT3a, pN0 FIGO Stage: IIIA COMMENT: There is tumor present on the surface of the left ovary and within the lumen of the left fallopian tube. The carcinoma appears to be mixed with the largest component being high grade serous carcinoma. Dr. Lyndon Code reviewed the case and agrees with the above diagnosis.   01/23/2018 Surgery   Surgeon: Donaciano Eva   Operation: Robotic-assisted laparoscopic total hysterectomy with bilateral salpingoophorectomy, SLN  injection, mapping and biopsy, right pelvic and para-aortic lymphadenectomy  Operative Findings:  : 10-12cm bulky  uterus with frank serosal involvement on posterior cul de sac peritoneum and anterior peritoneum which adhesed the bladder to the anterior uterus. Unilateral mapping on left pelvis. No grossly suspicious nodes. Normal omentum and diaphragms.    02/01/2018 Pathology Results   Lymph node, needle/core biopsy, right axilla - METASTATIC CARCINOMA - SEE COMMENT Microscopic Comment The neoplastic cells are positive for cytokeratin 7 and Pax-8 but negative for cytokeratin 5/6, cytokeratin 20, Gata-3, p63, p53 and GCDFP. Overall, the immunoprofile is consistent with metastasis from the patient's known gynecologic carcinoma.   02/01/2018 Procedure   Ultrasound-guided core biopsies of a suspicious right axillary lymph node.   02/21/2018 - 06/13/2018 Chemotherapy   The patient had carboplatin & Taxol x 6   03/26/2018 - 04/30/2018 Radiation Therapy   Radiation treatment dates:   03/26/18, 04/02/18, 04/19/18, 04/23/18 04/30/18  Site/dose: proximal Vagina, 6 Gy in 5 fractions for a total dose of 30 Gy    04/26/2018 PET scan   1. Interval resection of the hypermetabolic endometrial primary. No discernible hypermetabolic pelvic sidewall or peritoneal metastases. 2. Stable hypermetabolic bilateral axillary lymphadenopathy. The degree of hypermetabolism in these lymph nodes remains highly suspicious for neoplasm. 3. Interval development of low level FDG uptake in 2 stable, small lymph nodes in the left groin region. This may be reactive, but close attention recommended to exclude metastatic involvement. 4. Stable non hypermetabolic low-density liver lesions and the mixed lucent and sclerotic T12 lesion is stable without hypermetabolism today.   07/09/2018 Imaging   Status post hysterectomy.  Mild axillary lymphadenopathy, improved. Nodal metastases not excluded.  Irregular wall thickening involving the anterior bladder, possibly reflecting radiation cystitis versus tumor.  Additional stable findings as  above, including a 5 mm right middle lobe nodule and stable sclerosis involving the T12 vertebral body.   10/10/2018 Imaging   1. Stable exam. No findings within the abdomen or pelvis to suggest metastatic disease. 2. Unchanged appearance of sclerosis involving the T12 vertebra. 3.  Aortic Atherosclerosis (ICD10-I70.0).   10/10/2018 Imaging   Ct abdomen and pelvis 1. Stable exam. No findings within the abdomen or pelvis to suggest metastatic disease. 2. Unchanged appearance of sclerosis involving the T12 vertebra. 3.  Aortic Atherosclerosis (ICD10-I70.0).   01/16/2019 PET scan   1. Enlargement and increased activity in the left axillary, right axillary, and left inguinal adenopathy compatible with progressive malignancy. 2. Stable 4 mm right middle lobe subpleural nodule, not appreciably hypermetabolic. 3. Other imaging findings of potential clinical significance: Right kidney lower pole cyst. Aortic Atherosclerosis (ICD10-I70.0). Prominent stool throughout the colon favors constipation. Mild chronic left maxillary sinusitis.     02/01/2019 -  Chemotherapy   The patient had pembrolizumab and Lenvima for chemotherapy treatment.     04/25/2019 Imaging   Ct imaging 1. Interval response to therapy. Decrease in size of bilateral axillary and left inguinal lymph nodes. No new or progressive findings identified. 2. Stable sclerotic appearance of the T12 vertebra. 3. Aortic atherosclerosis. Lad coronary artery calcification noted.   08/30/2019 Imaging   1. Bulky RIGHT hilar lymph node, slightly increased in size from previous imaging. 2. Multiple foci of hyperenhancement in the liver. 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  Consider MRI liver on follow-up to establish a baseline for number of lesions  as these will appear variable on CT follow-up based on phase of contrast acquisition in the future. 3. Stable 5 mm nodule adjacent to the fissure, minor fissure in the RIGHT middle lobe.     11/28/2019 Imaging   1. Stable exam. No new or progressive findings. 2. Stable enlarged right axillary lymph node. 3. Stable tiny bilateral pulmonary nodules. Continued attention on follow-up recommended. 4. Scattered hypodensities in the liver parenchyma correspond to the hypervascular lesions seen on the previous study performed with intravenous contrast material. 5. Right renal cyst. 6. Aortic Atherosclerosis (ICD10-I70.0).       REVIEW OF SYSTEMS:   Constitutional: Denies fevers, chills or abnormal weight loss Eyes: Denies blurriness of vision Ears, nose, mouth, throat, and face: Denies mucositis or sore throat Respiratory: Denies cough, dyspnea or wheezes Cardiovascular: Denies palpitation, chest discomfort or lower extremity swelling Gastrointestinal:  Denies nausea, heartburn or change in bowel habits Skin: Denies abnormal skin rashes Lymphatics: Denies new lymphadenopathy or easy bruising Neurological:Denies numbness, tingling or new weaknesses Behavioral/Psych: Mood is stable, no new changes  All other systems were reviewed with the patient and are negative.  I have reviewed the past medical history, past surgical history, social history and family history with the patient and they are unchanged from previous note.  ALLERGIES:  is allergic to sulfa antibiotics and sulfamethoxazole.  MEDICATIONS:  Current Outpatient Medications  Medication Sig Dispense Refill  . insulin isophane & regular human (NOVOLIN 70/30 FLEXPEN RELION) (70-30) 100 UNIT/ML KwikPen Inject 14 Units into the skin daily with breakfast. 15 mL 3  . ketoconazole (NIZORAL) 2 % cream Apply to web space twice a day for 1 week then daily for 1 month 15 g 0  . lenvatinib 10 mg daily dose (LENVIMA, 10 MG DAILY DOSE,) capsule  Take 1 capsule (10 mg total) by mouth daily. 30 capsule 11  . levothyroxine (SYNTHROID) 75 MCG tablet Take 1 tablet (75 mcg total) by mouth daily before breakfast. 30 tablet 11  . lidocaine-prilocaine (EMLA) cream Apply to affected area once 30 g 3  . lisinopril-hydrochlorothiazide (PRINZIDE,ZESTORETIC) 20-12.5 MG tablet Take 1 tablet by mouth daily. (Patient taking differently: Take 1 tablet by mouth every morning. ) 30 tablet 11  . ondansetron (ZOFRAN) 8 MG tablet Take 1 tablet (8 mg total) by mouth daily. 30 tablet 1  . OneTouch Delica Lancets 40G MISC 1 each by Other route 2 (two) times daily. Use to monitor glucose levels BID; E11.65 100 each 2  . ONETOUCH VERIO test strip 1 each by Other route 3 (three) times daily. And lancets 3/day 300 each 3  . RELION PEN NEEDLES 32G X 4 MM MISC USE AS DIRECTED 50 each 0  . simvastatin (ZOCOR) 10 MG tablet Take 10 mg by mouth at bedtime.     . Vitamin D, Ergocalciferol, 2000 units CAPS Take 2,000 Units by mouth daily.   0   No current facility-administered medications for this visit.   Facility-Administered Medications Ordered in Other Visits  Medication Dose Route Frequency Provider Last Rate Last Admin  . heparin lock flush 100 unit/mL  500 Units Intracatheter Once PRN Alvy Bimler, Ni, MD      . pembrolizumab (KEYTRUDA) 200 mg in sodium chloride 0.9 % 50 mL chemo infusion  200 mg Intravenous Once Gorsuch, Ni, MD      . sodium chloride flush (NS) 0.9 % injection 10 mL  10 mL Intracatheter PRN  [name], MD        PHYSICAL EXAMINATION: ECOG PERFORMANCE STATUS: 1 - Symptomatic but completely ambulatory  Vitals:   02/21/20 0823  BP: 97/72  Pulse: 98  Resp: 18  Temp: 97.6 F (36.4 C)  SpO2: 98%   Filed Weights   02/21/20 0823  Weight: 181 lb (82.1 kg)    GENERAL:alert, no distress and comfortable SKIN: skin color, texture, turgor are normal, no rashes or significant lesions EYES: normal, Conjunctiva are pink and non-injected, sclera  clear OROPHARYNX:no exudate, no erythema and lips, buccal mucosa, and tongue normal  NECK: supple, thyroid normal size, non-tender, without nodularity LYMPH:  no palpable lymphadenopathy in the cervical, axillary or inguinal LUNGS: clear to auscultation and percussion with normal breathing effort HEART: regular rate & rhythm and no murmurs and no lower extremity edema ABDOMEN:abdomen soft, non-tender and normal bowel sounds Musculoskeletal:no cyanosis of digits and no clubbing  NEURO: alert & oriented x 3 with fluent speech, no focal motor/sensory deficits  LABORATORY DATA:  I have reviewed the data as listed    Component Value Date/Time   NA 140 02/21/2020 0814   K 3.6 02/21/2020 0814   CL 111 02/21/2020 0814   CO2 19 (L) 02/21/2020 0814   GLUCOSE 192 (H) 02/21/2020 0814   BUN 46 (H) 02/21/2020 0814   CREATININE 1.90 (H) 02/21/2020 0814   CREATININE 1.75 (H) 01/10/2020 1200   CALCIUM 9.1 02/21/2020 0814   PROT 6.3 (L) 02/21/2020 0814   ALBUMIN 3.3 (L) 02/21/2020 0814   AST 15 02/21/2020 0814   AST 13 (L) 01/10/2020 1200   ALT 8 02/21/2020 0814   ALT 11 01/10/2020 1200   ALKPHOS 66 02/21/2020 0814   BILITOT 0.4 02/21/2020 0814   BILITOT 0.4 01/10/2020 1200   GFRNONAA 28 (L) 02/21/2020 0814   GFRNONAA 29 (L) 01/10/2020 1200   GFRAA 34 (L) 01/10/2020 1200    No results found for: SPEP, UPEP  Lab Results  Component Value Date   WBC 5.4 02/21/2020   NEUTROABS 3.3 02/21/2020   HGB 10.4 (L) 02/21/2020   HCT 32.3 (L) 02/21/2020   MCV 88.0 02/21/2020   PLT 198 02/21/2020      Chemistry      Component Value Date/Time   NA 140 02/21/2020 0814   K 3.6 02/21/2020 0814   CL 111 02/21/2020 0814   CO2 19 (L) 02/21/2020 0814   BUN 46 (H) 02/21/2020 0814   CREATININE 1.90 (H) 02/21/2020 0814   CREATININE 1.75 (H) 01/10/2020 1200      Component Value Date/Time   CALCIUM 9.1 02/21/2020 0814   ALKPHOS 66 02/21/2020 0814   AST 15 02/21/2020 0814   AST 13 (L) 01/10/2020 1200    ALT 8 02/21/2020 0814   ALT 11 01/10/2020 1200   BILITOT 0.4 02/21/2020 0814   BILITOT 0.4 01/10/2020 1200

## 2020-02-21 NOTE — Assessment & Plan Note (Signed)
Her renal function is stable We discussed the importance of risk factor modification and adequate hydration I will not order CT imaging with contrast in the future

## 2020-02-21 NOTE — Assessment & Plan Note (Signed)
The lymphadenopathy is stable We will repeat imaging study in a few months

## 2020-02-21 NOTE — Assessment & Plan Note (Signed)
She has acquired hypothyroidism secondary to side effects of Keytruda We will be monitoring her TSH carefully and adjust the dose of Synthroid as needed

## 2020-02-21 NOTE — Assessment & Plan Note (Signed)
Her last imaging study showed she has no evidence of disease progression Overall, the lymphadenopathy is stable She tolerated treatment very well with no side effects I recommend we proceed with treatment indefinitely I plan to repeat imaging study again at the end of the year for further follow-up

## 2020-02-21 NOTE — Assessment & Plan Note (Signed)
This is likely multifactorial related to anemia chronic disease It is not likely due to her treatment She is not symptomatic.  Observe only.   

## 2020-02-21 NOTE — Assessment & Plan Note (Signed)
These are stable and likely benign in nature Observe closely with serial imaging study as above

## 2020-02-24 ENCOUNTER — Telehealth: Payer: Self-pay

## 2020-02-24 ENCOUNTER — Other Ambulatory Visit: Payer: Self-pay | Admitting: Hematology and Oncology

## 2020-02-24 MED ORDER — LISINOPRIL-HYDROCHLOROTHIAZIDE 10-12.5 MG PO TABS: 1.0000 | ORAL_TABLET | Freq: Every day | ORAL | 3 refills | Status: DC

## 2020-02-24 NOTE — Telephone Encounter (Signed)
Called back. She is taking Zestoretic 20 mg-25 mg tablet. She ask that the Rx be sent to [place]. She said thank you!

## 2020-02-24 NOTE — Telephone Encounter (Signed)
She called and requested a refill on Lisinopril Rx. She is having a hard time cutting the tablet in half. She is requesting a new Rx with the half dose amount.

## 2020-02-24 NOTE — Telephone Encounter (Signed)
I want to verify before I sent the new prescription: she is on Zestoretic which is a combo There is lower dose than she has right now So the refill will say zestoretic not lisinopril

## 2020-02-24 NOTE — Telephone Encounter (Signed)
done

## 2020-03-02 ENCOUNTER — Telehealth: Payer: Self-pay

## 2020-03-02 NOTE — Telephone Encounter (Signed)
New message    The patient just took blood sugar just now  47  This morning 59 .Average runs in the 90. Wondering why blood sugar is going low.   Have additional questions

## 2020-03-03 NOTE — Telephone Encounter (Signed)
Please see below.

## 2020-03-03 NOTE — Telephone Encounter (Signed)
Please reduce the insulin to 10 units with breakfast.

## 2020-03-04 NOTE — Telephone Encounter (Signed)
Notified pt with Dr Loanne Drilling instructions.

## 2020-03-09 DIAGNOSIS — M1711 Unilateral primary osteoarthritis, right knee: Secondary | ICD-10-CM | POA: Diagnosis not present

## 2020-03-10 ENCOUNTER — Telehealth: Payer: Self-pay

## 2020-03-10 NOTE — Telephone Encounter (Signed)
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  Past medical history of hysterectomy and CKD stage III.  Patient receiving [place].    Spoke with patient again via phone after she returned home from grocery store.  Patient reports that she eats but appetite is not the same as it once was.  She eats less "junk foods" and portion sizes are smaller.  Reports that her sister cooks mostly.  Breakfast is usually oatmeal with berries, sometimes with have bacon or sausage (1 time weekly), sometimes cheese toast.  Lunch is sandwich and milk.  May have fruit for snack, peanut butter crackers.  Dinner is meat and vegetables, pasta.  Reports that she feels good. Denies nutrition impact symptoms.  Drinks water mainly.  Please that insulin and blood pressure medications have been reduced    Medications: reviewed  Labs: reviewed  Anthropometrics:   Height: 66 inches Weight: 181 lb 190 lb in July 2021 BMI: 29  5% weight loss in the last 4 months, not significant   NUTRITION DIAGNOSIS: Unintentional weight loss related to change in diet and appetite as evidenced by 5% weight loss in the last 4 months, not significant   INTERVENTION:  Encouraged patient to continue well-balanced diet of plant foods and good sources of protein.  Patient with questions regarding oral nutrition supplements. Discussed shakes lower in carbohydrate as DM.  Patient wanting to try them.   Patent has contact information and will reach out to RD with further questions or concerns   NEXT VISIT: no follow-up  Krista Kugel B. Krista Johnson, [place], [place] Registered Dietitian [phone] (mobile)

## 2020-03-10 NOTE — Telephone Encounter (Signed)
Nutrition  Patient identified on Malnutrition Screening tool for weight loss and poor appetite.   Called patient to introduce self and service at [facility].  Patient was in grocery store and wanted to call RD back.  Contact number provided.  Nona Gracey B. Zenia Resides, [place], [place] Registered Dietitian [phone] (mobile)

## 2020-03-13 ENCOUNTER — Inpatient Hospital Stay: Payer: Medicare Other

## 2020-03-13 ENCOUNTER — Ambulatory Visit (HOSPITAL_BASED_OUTPATIENT_CLINIC_OR_DEPARTMENT_OTHER): Payer: Medicare Other | Admitting: Hematology and Oncology

## 2020-03-13 ENCOUNTER — Telehealth: Payer: Self-pay | Admitting: *Deleted

## 2020-03-13 ENCOUNTER — Encounter: Payer: Self-pay | Admitting: Hematology and Oncology

## 2020-03-13 ENCOUNTER — Other Ambulatory Visit: Payer: Self-pay

## 2020-03-13 VITALS — BP 153/73 | HR 81 | Temp 97.8°F | Resp 18 | Wt 183.5 lb

## 2020-03-13 DIAGNOSIS — Z5112 Encounter for antineoplastic immunotherapy: Secondary | ICD-10-CM | POA: Diagnosis not present

## 2020-03-13 DIAGNOSIS — C541 Malignant neoplasm of endometrium: Secondary | ICD-10-CM

## 2020-03-13 DIAGNOSIS — E039 Hypothyroidism, unspecified: Secondary | ICD-10-CM

## 2020-03-13 DIAGNOSIS — Z7189 Other specified counseling: Secondary | ICD-10-CM

## 2020-03-13 DIAGNOSIS — C779 Secondary and unspecified malignant neoplasm of lymph node, unspecified: Secondary | ICD-10-CM | POA: Diagnosis not present

## 2020-03-13 DIAGNOSIS — N179 Acute kidney failure, unspecified: Secondary | ICD-10-CM | POA: Diagnosis not present

## 2020-03-13 DIAGNOSIS — D638 Anemia in other chronic diseases classified elsewhere: Secondary | ICD-10-CM | POA: Diagnosis not present

## 2020-03-13 DIAGNOSIS — Z9221 Personal history of antineoplastic chemotherapy: Secondary | ICD-10-CM | POA: Diagnosis not present

## 2020-03-13 DIAGNOSIS — Z923 Personal history of irradiation: Secondary | ICD-10-CM | POA: Diagnosis not present

## 2020-03-13 DIAGNOSIS — C773 Secondary and unspecified malignant neoplasm of axilla and upper limb lymph nodes: Secondary | ICD-10-CM

## 2020-03-13 LAB — CBC WITH DIFFERENTIAL/PLATELET
Abs Immature Granulocytes: 0.02 10*3/uL (ref 0.00–0.07)
Basophils Absolute: 0 10*3/uL (ref 0.0–0.1)
Basophils Relative: 0 %
Eosinophils Absolute: 0 10*3/uL (ref 0.0–0.5)
Eosinophils Relative: 1 %
HCT: 29 % — ABNORMAL LOW (ref 36.0–46.0)
Hemoglobin: 9.3 g/dL — ABNORMAL LOW (ref 12.0–15.0)
Immature Granulocytes: 0 %
Lymphocytes Relative: 25 %
Lymphs Abs: 1.5 10*3/uL (ref 0.7–4.0)
MCH: 28.9 pg (ref 26.0–34.0)
MCHC: 32.1 g/dL (ref 30.0–36.0)
MCV: 90.1 fL (ref 80.0–100.0)
Monocytes Absolute: 0.4 10*3/uL (ref 0.1–1.0)
Monocytes Relative: 6 %
Neutro Abs: 4 10*3/uL (ref 1.7–7.7)
Neutrophils Relative %: 68 %
Platelets: 216 10*3/uL (ref 150–400)
RBC: 3.22 MIL/uL — ABNORMAL LOW (ref 3.87–5.11)
RDW: 13.7 % (ref 11.5–15.5)
WBC: 6 10*3/uL (ref 4.0–10.5)
nRBC: 0 % (ref 0.0–0.2)

## 2020-03-13 LAB — COMPREHENSIVE METABOLIC PANEL
ALT: 8 U/L (ref 0–44)
AST: 11 U/L — ABNORMAL LOW (ref 15–41)
Albumin: 3.3 g/dL — ABNORMAL LOW (ref 3.5–5.0)
Alkaline Phosphatase: 67 U/L (ref 38–126)
Anion gap: 12 (ref 5–15)
BUN: 63 mg/dL — ABNORMAL HIGH (ref 8–23)
CO2: 18 mmol/L — ABNORMAL LOW (ref 22–32)
Calcium: 9.3 mg/dL (ref 8.9–10.3)
Chloride: 112 mmol/L — ABNORMAL HIGH (ref 98–111)
Creatinine, Ser: 3.94 mg/dL (ref 0.44–1.00)
GFR, Estimated: 12 mL/min — ABNORMAL LOW (ref >60)
Glucose, Bld: 120 mg/dL — ABNORMAL HIGH (ref 70–99)
Potassium: 4.1 mmol/L (ref 3.5–5.1)
Sodium: 142 mmol/L (ref 135–145)
Total Bilirubin: 0.4 mg/dL (ref 0.3–1.2)
Total Protein: 6.5 g/dL (ref 6.5–8.1)

## 2020-03-13 LAB — TSH: TSH: 2.805 u[IU]/mL (ref 0.308–3.960)

## 2020-03-13 MED ORDER — SODIUM CHLORIDE 0.9 % IV SOLN
200.0000 mg | Freq: Once | INTRAVENOUS | Status: AC
  Administered 2020-03-13: 200 mg via INTRAVENOUS
  Filled 2020-03-13: qty 8

## 2020-03-13 MED ORDER — SODIUM CHLORIDE 0.9 % IV SOLN
Freq: Once | INTRAVENOUS | Status: AC
  Filled 2020-03-13: qty 250

## 2020-03-13 MED ORDER — SODIUM CHLORIDE 0.9% FLUSH
10.0000 mL | Freq: Once | INTRAVENOUS | Status: AC
  Administered 2020-03-13: 10 mL
  Filled 2020-03-13: qty 10

## 2020-03-13 MED ORDER — SODIUM CHLORIDE 0.9% FLUSH
10.0000 mL | INTRAVENOUS | Status: DC | PRN
  Administered 2020-03-13: 10 mL
  Filled 2020-03-13: qty 10

## 2020-03-13 MED ORDER — HEPARIN SOD (PORK) LOCK FLUSH 100 UNIT/ML IV SOLN
500.0000 [IU] | Freq: Once | INTRAVENOUS | Status: AC | PRN
  Administered 2020-03-13: 500 [IU]
  Filled 2020-03-13: qty 5

## 2020-03-13 NOTE — Assessment & Plan Note (Signed)
This is likely multifactorial related to anemia chronic disease It is not likely due to her treatment She is not symptomatic.  Observe only.   

## 2020-03-13 NOTE — Assessment & Plan Note (Signed)
She is scheduled to get CT imaging done next month However, with acute renal failure, I plan to order ultrasound of her kidneys to rule out hydronephrosis We will proceed with treatment today

## 2020-03-13 NOTE — Progress Notes (Signed)
[place] OFFICE PROGRESS NOTE  Patient Care Team: Jonathon Jordan, MD as PCP - General (Family Medicine) Awanda Mink Craige Cotta, RN as Registered Nurse (Oncology)  ASSESSMENT & PLAN:  Endometrial cancer [facility]) She is scheduled to get CT imaging done next month However, with acute renal failure, I plan to order ultrasound of her kidneys to rule out hydronephrosis We will proceed with treatment today  Acute renal failure (ARF) ([place]) She has acute on chronic renal failure of unknown etiology I recommend urgent ultrasound of her kidneys today to rule out hydronephrosis or treatable causes If ultrasound of her kidneys are unremarkable, I might have to move her CT scan to be done sooner  Anemia, chronic disease This is likely multifactorial related to anemia chronic disease It is not likely due to her treatment She is not symptomatic.  Observe only.     Orders Placed This Encounter  Procedures  . US RENAL    Standing Status:   Future    Standing Expiration Date:   03/13/2021    Order Specific Question:   Reason for Exam (SYMPTOM  OR DIAGNOSIS REQUIRED)    Answer:   acute on chronic renal failure, unknown cause    Order Specific Question:   Preferred imaging location?    Answer:   [facility]    Order Specific Question:   Call Results- Best Contact Number?    Answer:   [phone] or [phone]...hold pt     All questions were answered. The patient knows to call the clinic with any problems, questions or concerns. The total time spent in the appointment was 30 minutes encounter with patients including review of chart and various tests results, discussions about plan of care and coordination of care plan   [name], MD 03/13/2020 10:33 AM  INTERVAL HISTORY: Please see below for problem oriented charting. 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 Leo Grosser who obtained an endometrial biopsy showing poorly differentiated endometrial carcinoma and negative endocervical curettage   12/12/2017 Imaging   MAMMOGRAM FINDINGS: In the right axilla, a possible mass warrants further evaluation. In the left breast, no findings suspicious for malignancy.  Images were processed with CAD.  IMPRESSION: Further evaluation is suggested for possible mass in the right axilla.    12/24/2017 Imaging   Ct scan chest, abdomen and pelvis 1. Marked thickening of the endometrium (42 mm) compatible with known primary endometrial malignancy. No evidence of extrauterine invasion. 2. No pelvic or retroperitoneal adenopathy. 3. Right middle lobe 4 mm solid pulmonary nodule, for which follow-up chest CT is advised in 3-6 months. 4. Several findings that are equivocal for distant metastatic disease. Vaguely nodular heterogeneous hyperenhancement in the peripheral right liver lobe, which could represent benign transient perfusional phenomena, with underlying liver lesions not entirely excluded. Mildly sclerotic T12 vertebral lesion. Mildly enlarged right axillary lymph node. The best single test to further evaluate these findings  would be a PET-CT. Alternative tests include bone scan or thoracic MRI without and with IV contrast for the T12 osseous lesion, MRI abdomen without and with IV contrast for the liver findings, and diagnostic mammographic evaluation for the right  axillary node. 5.  Aortic Atherosclerosis (ICD10-I70.0).    01/09/2018 PET scan   1. Moderate hypermetabolism corresponding to enlarging axillary node since 12/22/2017. Highly suspicious for an atypical distribution of metastatic disease. 2. No hypermetabolism to suggest hepatic or T12 osseous metastasis. 3. Hypermetabolic endometrial primary.   01/23/2018 Initial Diagnosis   Endometrial cancer ([place])   01/23/2018 Pathology Results   1. Lymph node, sentinel, biopsy, left external iliac - NO CARCINOMA IDENTIFIED IN ONE LYMPH NODE (0/1) - SEE COMMENT 2. Lymph nodes, regional resection, right para aortic - NO CARCINOMA IDENTIFIED IN FOUR LYMPH NODES (0/4) - SEE COMMENT 3. Lymph nodes, regional resection, right pelvic - NO CARCINOMA IDENTIFIED IN EIGHT LYMPH NODES (0/8) - SEE COMMENT 4. Cul-de-sac biopsy - METASTATIC CARCINOMA 5. Uterus +/- tubes/ovaries, neoplastic, cervix, bilateral fallopian tubes and ovaries UTERUS: - MIXED SEROUS AND ENDOMETRIOID CARCINOMA - SEROSAL IMPLANTS PRESENT - LYMPHOVASCULAR SPACE INVASION PRESENT - LEIOMYOMATA (1.5 CM; LARGEST) - SEE ONCOLOGY TABLE AND COMMENT BELOW CERVIX: - BENIGN NABOTHIAN CYSTS - NO CARCINOMA IDENTIFIED BILATERAL OVARIES: - METASTATIC CARCINOMA PRESENT ON OVARIAN SURFACE BILATERAL FALLOPIAN TUBES: - INTRALUMINAL CARCINOMA Microscopic Comment 1. -3. Cytokeratin AE1/3 was performed on the sentinel lymph nodes to exclude micrometastasis. There is no evidence of metastatic carcinoma by immunohistochemistry. 5. 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%) Histologic Grade: N/A Myometrial invasion: Estimated less than 50% myometrial invasion (0.3 cm of myometrium involved; 1.4 cm measured  thickness) Uterine Serosa Involvement: Present Cervical stromal involvement: Not identified Extent of involvement of other organs: - Fallopian tube (left within the lumen) - Ovary, left (surface involvement) - Cul-de-sac Lymphovascular invasion: Present Regional Lymph Nodes: Examined: 1 Sentinel 12 Non-sentinel 13 Total Lymph nodes with metastasis: 0 Isolated tumor cells (< 0.2 mm): 0 Micrometastasis: (> 0.2 mm and < 2.0 mm): 0 Macrometastasis: (> 2.0 mm): 0 Extracapsular extension: N/A Tumor block for ancillary studies: 5E, 5B MMR / MSI testing: Pending will be reported separately Pathologic Stage Classification (pTNM, AJCC 8th edition): pT3a, pN0 FIGO Stage: IIIA COMMENT: There is tumor present on the surface of the left ovary and within the lumen of the left fallopian tube. The carcinoma appears to be mixed with the largest component being high grade serous carcinoma. Dr. Lyndon Code reviewed the case and agrees with the above diagnosis.   01/23/2018 Surgery   Surgeon: Donaciano Eva   Operation: Robotic-assisted laparoscopic total hysterectomy with bilateral salpingoophorectomy, SLN injection, mapping and biopsy, right pelvic and para-aortic lymphadenectomy  Operative Findings:  : 10-12cm bulky uterus with frank serosal involvement on posterior cul de sac peritoneum and anterior peritoneum which adhesed the bladder to the anterior uterus. Unilateral mapping on left pelvis. No grossly suspicious nodes. Normal omentum and diaphragms.    02/01/2018 Pathology Results   Lymph node, needle/core biopsy, right axilla - METASTATIC CARCINOMA - SEE COMMENT Microscopic Comment The neoplastic cells are positive for cytokeratin 7 and Pax-8 but negative for cytokeratin 5/6, cytokeratin 20, Gata-3, p63, p53 and GCDFP. Overall, the immunoprofile is consistent with metastasis from the patient's known gynecologic carcinoma.   02/01/2018 Procedure   Ultrasound-guided core biopsies of a  suspicious right axillary lymph node.   02/21/2018 - 06/13/2018 Chemotherapy   The patient had carboplatin & Taxol x 6   03/26/2018 - 04/30/2018 Radiation Therapy   Radiation treatment dates:   03/26/18, 04/02/18, 04/19/18, 04/23/18 04/30/18  Site/dose: proximal Vagina, 6 Gy in 5 fractions for a total dose of 30 Gy    04/26/2018 PET scan   1. Interval resection of the hypermetabolic endometrial primary. No discernible hypermetabolic pelvic sidewall or peritoneal metastases. 2. Stable hypermetabolic bilateral axillary lymphadenopathy. The degree of hypermetabolism in these lymph nodes remains highly suspicious for neoplasm. 3. Interval development of low level FDG uptake in 2 stable, small lymph nodes in the left groin region. This may be reactive, but close attention recommended to exclude metastatic involvement. 4. Stable non hypermetabolic low-density liver lesions and the mixed lucent and sclerotic T12 lesion is stable without hypermetabolism today.   07/09/2018 Imaging   Status post hysterectomy.  Mild axillary lymphadenopathy, improved. Nodal metastases not excluded.  Irregular wall thickening involving the anterior bladder, possibly reflecting radiation cystitis versus tumor.  Additional stable findings as above, including a 5 mm right middle lobe nodule and stable sclerosis involving the T12 vertebral body.   10/10/2018 Imaging   1. Stable exam. No findings within the abdomen or pelvis to suggest metastatic disease. 2. Unchanged appearance of sclerosis involving the T12 vertebra. 3.  Aortic Atherosclerosis (ICD10-I70.0).   10/10/2018 Imaging   Ct abdomen and pelvis 1. Stable exam. No findings within the abdomen or pelvis to suggest metastatic disease. 2. Unchanged appearance of sclerosis involving the T12 vertebra. 3.  Aortic Atherosclerosis (ICD10-I70.0).   01/16/2019 PET scan   1. Enlargement and increased activity in the left axillary, right axillary, and left inguinal  adenopathy compatible with progressive malignancy. 2. Stable 4 mm right middle lobe subpleural nodule, not appreciably hypermetabolic. 3. Other imaging findings of potential clinical significance: Right kidney lower pole cyst. Aortic Atherosclerosis (ICD10-I70.0). Prominent stool throughout the colon favors constipation. Mild chronic left maxillary sinusitis.     02/01/2019 -  Chemotherapy   The patient had pembrolizumab and Lenvima for chemotherapy treatment.     04/25/2019 Imaging   Ct imaging 1. Interval response to therapy. Decrease in size of bilateral axillary and left inguinal lymph nodes. No new or progressive findings identified. 2. Stable sclerotic appearance of the T12 vertebra. 3. Aortic atherosclerosis. Lad coronary artery calcification noted.   08/30/2019 Imaging   1. Bulky RIGHT hilar lymph node, slightly increased in size from previous imaging. 2. Multiple foci of hyperenhancement in the liver. 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 Consider MRI liver on follow-up to establish a baseline for number of lesions as these will appear variable on CT follow-up based on phase of contrast acquisition in the future. 3. Stable 5 mm nodule adjacent to the fissure, minor fissure in the RIGHT middle lobe.     11/28/2019 Imaging   1. Stable exam. No new or progressive findings. 2. Stable enlarged right axillary lymph node. 3. Stable tiny bilateral pulmonary nodules. Continued attention on follow-up recommended. 4. Scattered hypodensities in the liver parenchyma correspond to the hypervascular lesions seen on the previous study performed with intravenous contrast material. 5. Right renal cyst. 6. Aortic Atherosclerosis (ICD10-I70.0).       REVIEW OF SYSTEMS:  Constitutional: Denies  fevers, chills or abnormal weight loss Eyes: Denies blurriness of vision Ears, nose, mouth, throat, and face: Denies mucositis or sore throat Respiratory: Denies cough, dyspnea or wheezes Cardiovascular: Denies palpitation, chest discomfort or lower extremity swelling Gastrointestinal:  Denies nausea, heartburn or change in bowel habits Skin: Denies abnormal skin rashes Lymphatics: Denies new lymphadenopathy or easy bruising Neurological:Denies numbness, tingling or new weaknesses Behavioral/Psych: Mood is stable, no new changes  All other systems were reviewed with the patient and are negative.  I have reviewed the past medical history, past surgical history, social history and family history with the patient and they are unchanged from previous note.  ALLERGIES:  is allergic to sulfa antibiotics and sulfamethoxazole.  MEDICATIONS:  Current Outpatient Medications  Medication Sig Dispense Refill  . insulin isophane & regular human (NOVOLIN 70/30 FLEXPEN RELION) (70-30) 100 UNIT/ML KwikPen Inject 14 Units into the skin daily with breakfast. 15 mL 3  . ketoconazole (NIZORAL) 2 % cream Apply to web space twice a day for 1 week then daily for 1 month 15 g 0  . lenvatinib 10 mg daily dose (LENVIMA, 10 MG DAILY DOSE,) capsule Take 1 capsule (10 mg total) by mouth daily. 30 capsule 11  . levothyroxine (SYNTHROID) 75 MCG tablet Take 1 tablet (75 mcg total) by mouth daily before breakfast. 30 tablet 11  . lidocaine-prilocaine (EMLA) cream Apply to affected area once 30 g 3  . lisinopril-hydrochlorothiazide (ZESTORETIC) 10-12.5 MG tablet Take 1 tablet by mouth daily. 30 tablet 3  . ondansetron (ZOFRAN) 8 MG tablet Take 1 tablet (8 mg total) by mouth daily. 30 tablet 1  . OneTouch Delica Lancets 10R MISC 1 each by Other route 2 (two) times daily. Use to monitor glucose levels BID; E11.65 100 each 2  . ONETOUCH VERIO test strip 1 each by Other route 3 (three) times daily. And lancets 3/day 300 each 3   . RELION PEN NEEDLES 32G X 4 MM MISC USE AS DIRECTED 50 each 0  . simvastatin (ZOCOR) 10 MG tablet Take 10 mg by mouth at bedtime.     . Vitamin D, Ergocalciferol, 2000 units CAPS Take 2,000 Units by mouth daily.   0   No current facility-administered medications for this visit.   Facility-Administered Medications Ordered in Other Visits  Medication Dose Route Frequency Provider Last Rate Last Admin  . heparin lock flush 100 unit/mL  500 Units Intracatheter Once PRN Alvy Bimler, Landon Truax, MD      . pembrolizumab (KEYTRUDA) 200 mg in sodium chloride 0.9 % 50 mL chemo infusion  200 mg Intravenous Once Jeremiah Curci, MD      . sodium chloride flush (NS) 0.9 % injection 10 mL  10 mL Intracatheter PRN Fumi Guadron, MD        PHYSICAL EXAMINATION: ECOG PERFORMANCE STATUS: 1 - Symptomatic but completely ambulatory GENERAL:alert, no distress and comfortable NEURO: alert & oriented x 3 with fluent speech, no focal motor/sensory deficits  LABORATORY DATA:  I have reviewed the data as listed    Component Value Date/Time   NA 142 03/13/2020 0825   K 4.1 03/13/2020 0825   CL 112 (H) 03/13/2020 0825   CO2 18 (L) 03/13/2020 0825   GLUCOSE 120 (H) 03/13/2020 0825   BUN 63 (H) 03/13/2020 0825   CREATININE 3.94 (HH) 03/13/2020 0825   CREATININE 1.75 (H) 01/10/2020 1200   CALCIUM 9.3 03/13/2020 0825   PROT 6.5 03/13/2020 0825   ALBUMIN 3.3 (L) 03/13/2020 0825   AST  11 (L) 03/13/2020 0825   AST 13 (L) 01/10/2020 1200   ALT 8 03/13/2020 0825   ALT 11 01/10/2020 1200   ALKPHOS 67 03/13/2020 0825   BILITOT 0.4 03/13/2020 0825   BILITOT 0.4 01/10/2020 1200   GFRNONAA 12 (L) 03/13/2020 0825   GFRNONAA 29 (L) 01/10/2020 1200   GFRAA 34 (L) 01/10/2020 1200    No results found for: SPEP, UPEP  Lab Results  Component Value Date   WBC 6.0 03/13/2020   NEUTROABS 4.0 03/13/2020   HGB 9.3 (L) 03/13/2020   HCT 29.0 (L) 03/13/2020   MCV 90.1 03/13/2020   PLT 216 03/13/2020      Chemistry      Component  Value Date/Time   NA 142 03/13/2020 0825   K 4.1 03/13/2020 0825   CL 112 (H) 03/13/2020 0825   CO2 18 (L) 03/13/2020 0825   BUN 63 (H) 03/13/2020 0825   CREATININE 3.94 (HH) 03/13/2020 0825   CREATININE 1.75 (H) 01/10/2020 1200      Component Value Date/Time   CALCIUM 9.3 03/13/2020 0825   ALKPHOS 67 03/13/2020 0825   AST 11 (L) 03/13/2020 0825   AST 13 (L) 01/10/2020 1200   ALT 8 03/13/2020 0825   ALT 11 01/10/2020 1200   BILITOT 0.4 03/13/2020 0825   BILITOT 0.4 01/10/2020 1200

## 2020-03-13 NOTE — Assessment & Plan Note (Signed)
She has acute on chronic renal failure of unknown etiology I recommend urgent ultrasound of her kidneys today to rule out hydronephrosis or treatable causes If ultrasound of her kidneys are unremarkable, I might have to move her CT scan to be done sooner

## 2020-03-13 NOTE — Progress Notes (Signed)
Per Dr. Alvy Bimler, ok to treat Scr today. Per Dr. Alvy Bimler, patient will be added on to MD schedule to be seen today.

## 2020-03-13 NOTE — Patient Instructions (Signed)
[facility] Discharge Instructions for Patients Receiving Chemotherapy  Today you received the following chemotherapy agents :  Keytruda.  To help prevent nausea and vomiting after your treatment, we encourage you to take your nausea medication as prescribed.   If you develop nausea and vomiting that is not controlled by your nausea medication, call the clinic.   BELOW ARE SYMPTOMS THAT SHOULD BE REPORTED IMMEDIATELY:  *FEVER GREATER THAN 100.5 F  *CHILLS WITH OR WITHOUT FEVER  NAUSEA AND VOMITING THAT IS NOT CONTROLLED WITH YOUR NAUSEA MEDICATION  *UNUSUAL SHORTNESS OF BREATH  *UNUSUAL BRUISING OR BLEEDING  TENDERNESS IN MOUTH AND THROAT WITH OR WITHOUT PRESENCE OF ULCERS  *URINARY PROBLEMS  *BOWEL PROBLEMS  UNUSUAL RASH Items with * indicate a potential emergency and should be followed up as soon as possible.  Feel free to call the clinic should you have any questions or concerns. The clinic phone number is [phone].  Please show the CHEMO ALERT CARD at check-in to the Emergency Department and triage nurse.  

## 2020-03-13 NOTE — Telephone Encounter (Signed)
Received call from [facility] lab/Rhonda that critical creat is 3.94.  Will report to Dr Alvy Bimler.

## 2020-03-13 NOTE — Progress Notes (Signed)
Confirmed to give Keytruda today with elevated scr 3.94.  T.O. Dr Lesli Albee, PharmD

## 2020-03-16 ENCOUNTER — Encounter: Payer: Self-pay | Admitting: Hematology and Oncology

## 2020-03-16 ENCOUNTER — Inpatient Hospital Stay: Payer: Medicare Other

## 2020-03-16 ENCOUNTER — Telehealth: Payer: Self-pay

## 2020-03-16 ENCOUNTER — Ambulatory Visit (HOSPITAL_COMMUNITY)
Admission: RE | Admit: 2020-03-16 | Discharge: 2020-03-16 | Disposition: A | Discharge status: Home / Self Care | Payer: Medicare Other | Source: Ambulatory Visit | Attending: Hematology and Oncology | Admitting: Hematology and Oncology

## 2020-03-16 ENCOUNTER — Other Ambulatory Visit: Payer: Self-pay

## 2020-03-16 ENCOUNTER — Inpatient Hospital Stay (HOSPITAL_BASED_OUTPATIENT_CLINIC_OR_DEPARTMENT_OTHER): Payer: Medicare Other | Admitting: Hematology and Oncology

## 2020-03-16 DIAGNOSIS — C541 Malignant neoplasm of endometrium: Secondary | ICD-10-CM

## 2020-03-16 DIAGNOSIS — Z9221 Personal history of antineoplastic chemotherapy: Secondary | ICD-10-CM | POA: Diagnosis not present

## 2020-03-16 DIAGNOSIS — C779 Secondary and unspecified malignant neoplasm of lymph node, unspecified: Secondary | ICD-10-CM | POA: Diagnosis not present

## 2020-03-16 DIAGNOSIS — I10 Essential (primary) hypertension: Secondary | ICD-10-CM | POA: Diagnosis not present

## 2020-03-16 DIAGNOSIS — Z5112 Encounter for antineoplastic immunotherapy: Secondary | ICD-10-CM | POA: Diagnosis not present

## 2020-03-16 DIAGNOSIS — N179 Acute kidney failure, unspecified: Secondary | ICD-10-CM | POA: Diagnosis not present

## 2020-03-16 DIAGNOSIS — Z923 Personal history of irradiation: Secondary | ICD-10-CM | POA: Diagnosis not present

## 2020-03-16 DIAGNOSIS — C773 Secondary and unspecified malignant neoplasm of axilla and upper limb lymph nodes: Secondary | ICD-10-CM

## 2020-03-16 LAB — COMPREHENSIVE METABOLIC PANEL
ALT: 10 U/L (ref 0–44)
AST: 14 U/L — ABNORMAL LOW (ref 15–41)
Albumin: 3.3 g/dL — ABNORMAL LOW (ref 3.5–5.0)
Alkaline Phosphatase: 69 U/L (ref 38–126)
Anion gap: 12 (ref 5–15)
BUN: 50 mg/dL — ABNORMAL HIGH (ref 8–23)
CO2: 18 mmol/L — ABNORMAL LOW (ref 22–32)
Calcium: 8.8 mg/dL — ABNORMAL LOW (ref 8.9–10.3)
Chloride: 111 mmol/L (ref 98–111)
Creatinine, Ser: 3.43 mg/dL (ref 0.44–1.00)
GFR, Estimated: 14 mL/min — ABNORMAL LOW (ref >60)
Glucose, Bld: 131 mg/dL — ABNORMAL HIGH (ref 70–99)
Potassium: 3.8 mmol/L (ref 3.5–5.1)
Sodium: 141 mmol/L (ref 135–145)
Total Bilirubin: 0.4 mg/dL (ref 0.3–1.2)
Total Protein: 6.3 g/dL — ABNORMAL LOW (ref 6.5–8.1)

## 2020-03-16 MED ORDER — SODIUM CHLORIDE 0.9% FLUSH
10.0000 mL | Freq: Once | INTRAVENOUS | Status: AC
  Administered 2020-03-16: 10 mL
  Filled 2020-03-16: qty 10

## 2020-03-16 MED ORDER — HEPARIN SOD (PORK) LOCK FLUSH 100 UNIT/ML IV SOLN
500.0000 [IU] | Freq: Once | INTRAVENOUS | Status: AC
  Administered 2020-03-16: 500 [IU]
  Filled 2020-03-16: qty 5

## 2020-03-16 NOTE — Telephone Encounter (Signed)
Called and left a message on referral line for intake person's voicemail. Faxed referral to [phone]. Received confirmation.

## 2020-03-16 NOTE — Telephone Encounter (Signed)
Call per Dr. Alvy Bimler and scheduled lab/ flush prior to ultrasound and virtual visit at 1:45 today with Dr. Alvy Bimler. She is aware of the appt times.

## 2020-03-16 NOTE — Assessment & Plan Note (Signed)
Renal failure has improved just slightly I continue to reinforce hydration I reviewed ultrasound imaging study which showed no evidence of kidney stone no hydronephrosis I recommend discontinuation of Zestoretic I plan to recheck her labs again next week and I recommend nephrology consultation and she is in agreement

## 2020-03-16 NOTE — Progress Notes (Signed)
Reported crtiical creatinine of 3.43 to Dr. Alvy Bimler.

## 2020-03-16 NOTE — Patient Instructions (Signed)

## 2020-03-16 NOTE — Progress Notes (Signed)
HEMATOLOGY-ONCOLOGY ELECTRONIC VISIT PROGRESS NOTE  Patient Care Team: Jonathon Jordan, MD as PCP - General (Family Medicine) Awanda Mink Craige Cotta, RN as Registered Nurse (Oncology)  I connected with by [facility] video conference  I also discussed with the patient that there may be a patient responsible charge related to this service. The patient expressed understanding and agreed to proceed.   ASSESSMENT & PLAN:  Acute renal failure (ARF) (HCC) Renal failure has improved just slightly I continue to reinforce hydration I reviewed ultrasound imaging study which showed no evidence of kidney stone no hydronephrosis I recommend discontinuation of Zestoretic I plan to recheck her labs again next week and I recommend nephrology consultation and she is in agreement  Essential hypertension Her recent blood pressure documentation was within normal limits With discontinuation of Zestoretic, I recommend her to continue checking her blood pressure twice a day I reviewed reviewed results of her blood work and blood pressure monitoring next week If her blood pressures start trending up, I plan to start her on something else that could not cause any risk of kidney injury   Orders Placed This Encounter  Procedures  . Ambulatory referral to Nephrology    Referral Priority:   Routine    Referral Type:   Consultation    Referral Reason:   Specialty Services Required    Referred to Provider:   Rosita Fire, MD    Requested Specialty:   Nephrology    Number of Visits Requested:   1    INTERVAL HISTORY: Please see below for problem oriented charting. 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 Leo Grosser who obtained an endometrial biopsy showing poorly differentiated endometrial carcinoma and negative endocervical curettage   12/12/2017 Imaging   MAMMOGRAM FINDINGS: In the right axilla, a possible mass warrants further evaluation. In the left breast, no findings suspicious for malignancy.  Images were processed with CAD.  IMPRESSION: Further evaluation is suggested for possible mass in the right axilla.    12/24/2017 Imaging   Ct scan chest, abdomen and pelvis 1. Marked thickening of the endometrium (42 mm) compatible with known primary endometrial malignancy. No evidence of extrauterine invasion. 2. No pelvic or retroperitoneal adenopathy. 3. Right middle lobe 4 mm solid pulmonary nodule, for which follow-up chest CT is advised in 3-6 months. 4. Several findings that are equivocal for distant metastatic disease. Vaguely nodular heterogeneous hyperenhancement in the peripheral right liver lobe, which could represent benign transient perfusional phenomena, with underlying liver lesions not entirely excluded. Mildly sclerotic T12 vertebral lesion. Mildly enlarged right axillary lymph node. The best single test to further evaluate these findings would be a PET-CT. Alternative tests include bone scan or thoracic MRI without and with IV contrast for the T12 osseous lesion, MRI abdomen without and with IV contrast for the liver findings, and diagnostic mammographic evaluation for the right axillary node. 5.  Aortic Atherosclerosis (ICD10-I70.0).  01/09/2018 PET scan   1. Moderate hypermetabolism corresponding to enlarging axillary node since 12/22/2017. Highly suspicious for  an atypical distribution of metastatic disease. 2. No hypermetabolism to suggest hepatic or T12 osseous metastasis. 3. Hypermetabolic endometrial primary.   01/23/2018 Initial Diagnosis   Endometrial cancer ([place])   01/23/2018 Pathology Results   1. Lymph node, sentinel, biopsy, left external iliac - NO CARCINOMA IDENTIFIED IN ONE LYMPH NODE (0/1) - SEE COMMENT 2. Lymph nodes, regional resection, right para aortic - NO CARCINOMA IDENTIFIED IN FOUR LYMPH NODES (0/4) - SEE COMMENT 3. Lymph nodes, regional resection, right pelvic - NO CARCINOMA IDENTIFIED IN EIGHT LYMPH NODES (0/8) - SEE COMMENT 4. Cul-de-sac biopsy - METASTATIC CARCINOMA 5. Uterus +/- tubes/ovaries, neoplastic, cervix, bilateral fallopian tubes and ovaries UTERUS: - MIXED SEROUS AND ENDOMETRIOID CARCINOMA - SEROSAL IMPLANTS PRESENT - LYMPHOVASCULAR SPACE INVASION PRESENT - LEIOMYOMATA (1.5 CM; LARGEST) - SEE ONCOLOGY TABLE AND COMMENT BELOW CERVIX: - BENIGN NABOTHIAN CYSTS - NO CARCINOMA IDENTIFIED BILATERAL OVARIES: - METASTATIC CARCINOMA PRESENT ON OVARIAN SURFACE BILATERAL FALLOPIAN TUBES: - INTRALUMINAL CARCINOMA Microscopic Comment 1. -3. Cytokeratin AE1/3 was performed on the sentinel lymph nodes to exclude micrometastasis. There is no evidence of metastatic carcinoma by immunohistochemistry. 5. 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%) Histologic Grade: N/A Myometrial invasion: Estimated less than 50% myometrial invasion (0.3 cm of myometrium involved; 1.4 cm measured thickness) Uterine Serosa Involvement: Present Cervical stromal involvement: Not identified Extent of involvement of other organs: - Fallopian tube (left within the lumen) - Ovary, left (surface  involvement) - Cul-de-sac Lymphovascular invasion: Present Regional Lymph Nodes: Examined: 1 Sentinel 12 Non-sentinel 13 Total Lymph nodes with metastasis: 0 Isolated tumor cells (< 0.2 mm): 0 Micrometastasis: (> 0.2 mm and < 2.0 mm): 0 Macrometastasis: (> 2.0 mm): 0 Extracapsular extension: N/A Tumor block for ancillary studies: 5E, 5B MMR / MSI testing: Pending will be reported separately Pathologic Stage Classification (pTNM, AJCC 8th edition): pT3a, pN0 FIGO Stage: IIIA COMMENT: There is tumor present on the surface of the left ovary and within the lumen of the left fallopian tube. The carcinoma appears to be mixed with the largest component being high grade serous carcinoma. Dr. Lyndon Code reviewed the case and agrees with the above diagnosis.   01/23/2018 Surgery   Surgeon: Donaciano Eva   Operation: Robotic-assisted laparoscopic total hysterectomy with bilateral salpingoophorectomy, SLN injection, mapping and biopsy, right pelvic and para-aortic lymphadenectomy  Operative Findings:  : 10-12cm bulky uterus with frank serosal involvement on posterior cul de sac peritoneum and anterior peritoneum which adhesed the bladder to the anterior uterus. Unilateral mapping on left pelvis. No grossly suspicious nodes. Normal omentum and diaphragms.    02/01/2018 Pathology Results   Lymph node, needle/core biopsy, right axilla - METASTATIC CARCINOMA - SEE COMMENT Microscopic Comment The neoplastic cells are positive for cytokeratin 7 and Pax-8 but negative for cytokeratin 5/6, cytokeratin 20, Gata-3, p63, p53 and GCDFP. Overall, the immunoprofile is consistent with metastasis from the patient's known gynecologic carcinoma.   02/01/2018 Procedure   Ultrasound-guided core biopsies of a suspicious right axillary lymph node.   02/21/2018 - 06/13/2018 Chemotherapy   The patient had carboplatin & Taxol x 6   03/26/2018 - 04/30/2018 Radiation Therapy   Radiation treatment dates:   03/26/18,  04/02/18, 04/19/18, 04/23/18 04/30/18  Site/dose: proximal Vagina, 6 Gy in 5 fractions  for a total dose of 30 Gy    04/26/2018 PET scan   1. Interval resection of the hypermetabolic endometrial primary. No discernible hypermetabolic pelvic sidewall or peritoneal metastases. 2. Stable hypermetabolic bilateral axillary lymphadenopathy. The degree of hypermetabolism in these lymph nodes remains highly suspicious for neoplasm. 3. Interval development of low level FDG uptake in 2 stable, small lymph nodes in the left groin region. This may be reactive, but close attention recommended to exclude metastatic involvement. 4. Stable non hypermetabolic low-density liver lesions and the mixed lucent and sclerotic T12 lesion is stable without hypermetabolism today.   07/09/2018 Imaging   Status post hysterectomy.  Mild axillary lymphadenopathy, improved. Nodal metastases not excluded.  Irregular wall thickening involving the anterior bladder, possibly reflecting radiation cystitis versus tumor.  Additional stable findings as above, including a 5 mm right middle lobe nodule and stable sclerosis involving the T12 vertebral body.   10/10/2018 Imaging   1. Stable exam. No findings within the abdomen or pelvis to suggest metastatic disease. 2. Unchanged appearance of sclerosis involving the T12 vertebra. 3.  Aortic Atherosclerosis (ICD10-I70.0).   10/10/2018 Imaging   Ct abdomen and pelvis 1. Stable exam. No findings within the abdomen or pelvis to suggest metastatic disease. 2. Unchanged appearance of sclerosis involving the T12 vertebra. 3.  Aortic Atherosclerosis (ICD10-I70.0).   01/16/2019 PET scan   1. Enlargement and increased activity in the left axillary, right axillary, and left inguinal adenopathy compatible with progressive malignancy. 2. Stable 4 mm right middle lobe subpleural nodule, not appreciably hypermetabolic. 3. Other imaging findings of potential clinical significance: Right kidney  lower pole cyst. Aortic Atherosclerosis (ICD10-I70.0). Prominent stool throughout the colon favors constipation. Mild chronic left maxillary sinusitis.     02/01/2019 -  Chemotherapy   The patient had pembrolizumab and Lenvima for chemotherapy treatment.     04/25/2019 Imaging   Ct imaging 1. Interval response to therapy. Decrease in size of bilateral axillary and left inguinal lymph nodes. No new or progressive findings identified. 2. Stable sclerotic appearance of the T12 vertebra. 3. Aortic atherosclerosis. Lad coronary artery calcification noted.   08/30/2019 Imaging   1. Bulky RIGHT hilar lymph node, slightly increased in size from previous imaging. 2. Multiple foci of hyperenhancement in the liver. 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 Consider MRI liver on follow-up to establish a baseline for number of lesions as these will appear variable on CT follow-up based on phase of contrast acquisition in the future. 3. Stable 5 mm nodule adjacent to the fissure, minor fissure in the RIGHT middle lobe.     11/28/2019 Imaging   1. Stable exam. No new or progressive findings. 2. Stable enlarged right axillary lymph node. 3. Stable tiny bilateral pulmonary nodules. Continued attention on follow-up recommended. 4. Scattered hypodensities in the liver parenchyma correspond to the hypervascular lesions seen on the previous study performed with intravenous contrast material. 5. Right renal cyst. 6. Aortic Atherosclerosis (ICD10-I70.0).     03/16/2020 Imaging   Increased echogenicity of renal parenchyma is noted bilaterally suggesting medical renal disease. No hydronephrosis or renal obstruction is noted     REVIEW OF SYSTEMS:   Constitutional: Denies fevers, chills or abnormal weight  loss Eyes: Denies blurriness of vision Ears, nose, mouth, throat, and face: Denies mucositis or  sore throat Respiratory: Denies cough, dyspnea or wheezes Cardiovascular: Denies palpitation, chest discomfort Gastrointestinal:  Denies nausea, heartburn or change in bowel habits Skin: Denies abnormal skin rashes Lymphatics: Denies new lymphadenopathy or easy bruising Neurological:Denies numbness, tingling or new weaknesses Behavioral/Psych: Mood is stable, no new changes  Extremities: No lower extremity edema All other systems were reviewed with the patient and are negative.  I have reviewed the past medical history, past surgical history, social history and family history with the patient and they are unchanged from previous note.  ALLERGIES:  is allergic to sulfa antibiotics and sulfamethoxazole.  MEDICATIONS:  Current Outpatient Medications  Medication Sig Dispense Refill  . insulin isophane & regular human (NOVOLIN 70/30 FLEXPEN RELION) (70-30) 100 UNIT/ML KwikPen Inject 14 Units into the skin daily with breakfast. 15 mL 3  . ketoconazole (NIZORAL) 2 % cream Apply to web space twice a day for 1 week then daily for 1 month 15 g 0  . lenvatinib 10 mg daily dose (LENVIMA, 10 MG DAILY DOSE,) capsule Take 1 capsule (10 mg total) by mouth daily. 30 capsule 11  . levothyroxine (SYNTHROID) 75 MCG tablet Take 1 tablet (75 mcg total) by mouth daily before breakfast. 30 tablet 11  . lidocaine-prilocaine (EMLA) cream Apply to affected area once 30 g 3  . ondansetron (ZOFRAN) 8 MG tablet Take 1 tablet (8 mg total) by mouth daily. 30 tablet 1  . OneTouch Delica Lancets 69G MISC 1 each by Other route 2 (two) times daily. Use to monitor glucose levels BID; E11.65 100 each 2  . ONETOUCH VERIO test strip 1 each by Other route 3 (three) times daily. And lancets 3/day 300 each 3  . RELION PEN NEEDLES 32G X 4 MM MISC USE AS DIRECTED 50 each 0  . simvastatin (ZOCOR) 10 MG tablet Take 10 mg by mouth at  bedtime.     . Vitamin D, Ergocalciferol, 2000 units CAPS Take 2,000 Units by mouth daily.   0   No current facility-administered medications for this visit.    PHYSICAL EXAMINATION: ECOG PERFORMANCE STATUS: 0 - Asymptomatic  LABORATORY DATA:  I have reviewed the data as listed CMP Latest Ref Rng & Units 03/16/2020 03/13/2020 02/21/2020  Glucose 70 - 99 mg/dL 131(H) 120(H) 192(H)  BUN 8 - 23 mg/dL 50(H) 63(H) 46(H)  Creatinine 0.44 - 1.00 mg/dL 3.43(HH) 3.94(HH) 1.90(H)  Sodium 135 - 145 mmol/L 141 142 140  Potassium 3.5 - 5.1 mmol/L 3.8 4.1 3.6  Chloride 98 - 111 mmol/L 111 112(H) 111  CO2 22 - 32 mmol/L 18(L) 18(L) 19(L)  Calcium 8.9 - 10.3 mg/dL 8.8(L) 9.3 9.1  Total Protein 6.5 - 8.1 g/dL 6.3(L) 6.5 6.3(L)  Total Bilirubin 0.3 - 1.2 mg/dL 0.4 0.4 0.4  Alkaline Phos 38 - 126 U/L 69 67 66  AST 15 - 41 U/L 14(L) 11(L) 15  ALT 0 - 44 U/L _0 Lab Results  Component Value Date   WBC 6.0 03/13/2020   HGB 9.3 (L) 03/13/2020   HCT 29.0 (L) 03/13/2020   MCV 90.1 03/13/2020   PLT 216 03/13/2020   NEUTROABS 4.0 03/13/2020   Review ultrasound results with the patient  RADIOGRAPHIC STUDIES:US RENAL  Result Date: 03/16/2020 CLINICAL DATA:  Acute renal failure. EXAM: RENAL / URINARY TRACT ULTRASOUND COMPLETE COMPARISON:  None. FINDINGS: Right Kidney: Renal measurements: 11.9 x 6.4 x 5.3 cm = volume: 210 mL. Increased echogenicity of renal parenchyma is noted suggesting medical renal disease. 3.8  cm simple cyst is noted. No mass or hydronephrosis visualized. Left Kidney: Renal measurements: 11.0 x 5.5 x 5.2 cm = volume: 165 mL. Increased echogenicity of renal parenchyma is noted suggesting medical renal disease. No mass or hydronephrosis visualized. Bladder: Appears normal for degree of bladder distention. Other: None. IMPRESSION: Increased echogenicity of renal parenchyma is noted bilaterally suggesting medical renal disease. No hydronephrosis or renal obstruction is noted.  Electronically Signed   By: Marijo Conception M.D.   On: 03/16/2020 11:35    I discussed the assessment and treatment plan with the patient. The patient was provided an opportunity to ask questions and all were answered. The patient agreed with the plan and demonstrated an understanding of the instructions. The patient was advised to call back or seek an in-person evaluation if the symptoms worsen or if the condition fails to improve as anticipated.    I spent 20 minutes for the appointment reviewing test results, discuss management and coordination of care.  [name], MD 03/16/2020 3:09 PM

## 2020-03-16 NOTE — Telephone Encounter (Signed)
-----   Message from [name], MD sent at 03/16/2020  3:11 PM EST ----- Regarding: nephorology consult I finished my notes, pls refer to nephrology consult

## 2020-03-16 NOTE — Assessment & Plan Note (Signed)
Her recent blood pressure documentation was within normal limits With discontinuation of Zestoretic, I recommend her to continue checking her blood pressure twice a day I reviewed reviewed results of her blood work and blood pressure monitoring next week If her blood pressures start trending up, I plan to start her on something else that could not cause any risk of kidney injury

## 2020-03-17 ENCOUNTER — Telehealth: Payer: Self-pay | Admitting: Hematology and Oncology

## 2020-03-17 NOTE — Telephone Encounter (Signed)
Scheduled appt per 11/29 sch msg - pt is aware of appt date and time   

## 2020-03-24 ENCOUNTER — Telehealth: Payer: Self-pay

## 2020-03-24 ENCOUNTER — Other Ambulatory Visit: Payer: Self-pay

## 2020-03-24 ENCOUNTER — Inpatient Hospital Stay: Payer: Medicare Other | Attending: Gynecology | Admitting: Hematology and Oncology

## 2020-03-24 ENCOUNTER — Encounter: Payer: Self-pay | Admitting: Hematology and Oncology

## 2020-03-24 ENCOUNTER — Inpatient Hospital Stay: Payer: Medicare Other

## 2020-03-24 DIAGNOSIS — I129 Hypertensive chronic kidney disease with stage 1 through stage 4 chronic kidney disease, or unspecified chronic kidney disease: Secondary | ICD-10-CM | POA: Insufficient documentation

## 2020-03-24 DIAGNOSIS — I10 Essential (primary) hypertension: Secondary | ICD-10-CM | POA: Diagnosis not present

## 2020-03-24 DIAGNOSIS — Z452 Encounter for adjustment and management of vascular access device: Secondary | ICD-10-CM | POA: Insufficient documentation

## 2020-03-24 DIAGNOSIS — D638 Anemia in other chronic diseases classified elsewhere: Secondary | ICD-10-CM

## 2020-03-24 DIAGNOSIS — N3289 Other specified disorders of bladder: Secondary | ICD-10-CM | POA: Diagnosis not present

## 2020-03-24 DIAGNOSIS — N183 Chronic kidney disease, stage 3 unspecified: Secondary | ICD-10-CM | POA: Insufficient documentation

## 2020-03-24 DIAGNOSIS — Z923 Personal history of irradiation: Secondary | ICD-10-CM | POA: Diagnosis not present

## 2020-03-24 DIAGNOSIS — Z5112 Encounter for antineoplastic immunotherapy: Secondary | ICD-10-CM | POA: Diagnosis not present

## 2020-03-24 DIAGNOSIS — C541 Malignant neoplasm of endometrium: Secondary | ICD-10-CM | POA: Diagnosis not present

## 2020-03-24 DIAGNOSIS — I700 Atherosclerosis of aorta: Secondary | ICD-10-CM | POA: Diagnosis not present

## 2020-03-24 DIAGNOSIS — C773 Secondary and unspecified malignant neoplasm of axilla and upper limb lymph nodes: Secondary | ICD-10-CM | POA: Diagnosis not present

## 2020-03-24 DIAGNOSIS — Z9221 Personal history of antineoplastic chemotherapy: Secondary | ICD-10-CM | POA: Diagnosis not present

## 2020-03-24 DIAGNOSIS — Z79899 Other long term (current) drug therapy: Secondary | ICD-10-CM | POA: Diagnosis not present

## 2020-03-24 DIAGNOSIS — Z7984 Long term (current) use of oral hypoglycemic drugs: Secondary | ICD-10-CM | POA: Diagnosis not present

## 2020-03-24 DIAGNOSIS — N179 Acute kidney failure, unspecified: Secondary | ICD-10-CM | POA: Diagnosis not present

## 2020-03-24 DIAGNOSIS — E039 Hypothyroidism, unspecified: Secondary | ICD-10-CM

## 2020-03-24 DIAGNOSIS — R339 Retention of urine, unspecified: Secondary | ICD-10-CM | POA: Diagnosis not present

## 2020-03-24 LAB — CBC WITH DIFFERENTIAL/PLATELET
Abs Immature Granulocytes: 0.03 10*3/uL (ref 0.00–0.07)
Basophils Absolute: 0 10*3/uL (ref 0.0–0.1)
Basophils Relative: 0 %
Eosinophils Absolute: 0 10*3/uL (ref 0.0–0.5)
Eosinophils Relative: 0 %
HCT: 32.9 % — ABNORMAL LOW (ref 36.0–46.0)
Hemoglobin: 10.4 g/dL — ABNORMAL LOW (ref 12.0–15.0)
Immature Granulocytes: 1 %
Lymphocytes Relative: 23 %
Lymphs Abs: 1.3 10*3/uL (ref 0.7–4.0)
MCH: 28.7 pg (ref 26.0–34.0)
MCHC: 31.6 g/dL (ref 30.0–36.0)
MCV: 90.9 fL (ref 80.0–100.0)
Monocytes Absolute: 0.3 10*3/uL (ref 0.1–1.0)
Monocytes Relative: 5 %
Neutro Abs: 4.1 10*3/uL (ref 1.7–7.7)
Neutrophils Relative %: 71 %
Platelets: 179 10*3/uL (ref 150–400)
RBC: 3.62 MIL/uL — ABNORMAL LOW (ref 3.87–5.11)
RDW: 13.4 % (ref 11.5–15.5)
WBC: 5.8 10*3/uL (ref 4.0–10.5)
nRBC: 0 % (ref 0.0–0.2)

## 2020-03-24 LAB — COMPREHENSIVE METABOLIC PANEL
ALT: 14 U/L (ref 0–44)
AST: 17 U/L (ref 15–41)
Albumin: 3.5 g/dL (ref 3.5–5.0)
Alkaline Phosphatase: 67 U/L (ref 38–126)
Anion gap: 11 (ref 5–15)
BUN: 41 mg/dL — ABNORMAL HIGH (ref 8–23)
CO2: 19 mmol/L — ABNORMAL LOW (ref 22–32)
Calcium: 9.3 mg/dL (ref 8.9–10.3)
Chloride: 112 mmol/L — ABNORMAL HIGH (ref 98–111)
Creatinine, Ser: 2.82 mg/dL — ABNORMAL HIGH (ref 0.44–1.00)
GFR, Estimated: 18 mL/min — ABNORMAL LOW (ref >60)
Glucose, Bld: 142 mg/dL — ABNORMAL HIGH (ref 70–99)
Potassium: 4.4 mmol/L (ref 3.5–5.1)
Sodium: 142 mmol/L (ref 135–145)
Total Bilirubin: 0.4 mg/dL (ref 0.3–1.2)
Total Protein: 6.7 g/dL (ref 6.5–8.1)

## 2020-03-24 LAB — TSH: TSH: 4.819 u[IU]/mL — ABNORMAL HIGH (ref 0.308–3.960)

## 2020-03-24 NOTE — Assessment & Plan Note (Signed)
Renal failure is improving She is increasing oral fluid hydration as much as she can Nephrology consult is pending She will continue to hold lisinopril

## 2020-03-24 NOTE — Assessment & Plan Note (Signed)
Her blood pressure was elevated since recent discontinuation of lisinopril I recommend she contact her primary care doctor for further management If she is not able to get hold of her doctor, I will manage her blood pressure

## 2020-03-24 NOTE — Progress Notes (Signed)
HEMATOLOGY-ONCOLOGY ELECTRONIC VISIT PROGRESS NOTE  Patient Care Team: Jonathon Jordan, MD as PCP - General (Family Medicine) Awanda Mink Craige Cotta, RN as Registered Nurse (Oncology)  I connected with by [facility] video conference and verified that I am speaking with the correct person using two identifiers.   ASSESSMENT & PLAN:  Acute renal failure (ARF) ([place]) Renal failure is improving She is increasing oral fluid hydration as much as she can Nephrology consult is pending She will continue to hold lisinopril  Essential hypertension Her blood pressure was elevated since recent discontinuation of lisinopril I recommend she contact her primary care doctor for further management If she is not able to get hold of her doctor, I will manage her blood pressure  Anemia, chronic disease This is likely multifactorial related to anemia chronic disease It is not likely due to her treatment She is not symptomatic.  Observe only.     No orders of the defined types were placed in this encounter.   INTERVAL HISTORY: Please see below for problem oriented charting. The purpose of today's ED visit is to follow-up on recent acute renal failure and blood work from today She feels fine She is urinating well She has increase her oral fluid intake extensively The patient denies any recent signs or symptoms of bleeding such as spontaneous epistaxis, hematuria or hematochezia. 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 Leo Grosser who obtained an endometrial biopsy showing poorly  differentiated endometrial carcinoma and negative endocervical curettage   12/12/2017 Imaging   MAMMOGRAM FINDINGS: In the right axilla, a possible mass warrants further evaluation. In the left breast, no findings suspicious for malignancy.  Images were processed with CAD.  IMPRESSION: Further evaluation is suggested for possible mass in the right axilla.    12/24/2017 Imaging   Ct scan chest, abdomen and pelvis 1. Marked thickening of the endometrium (42 mm) compatible with known primary endometrial malignancy. No evidence of extrauterine invasion. 2. No pelvic or retroperitoneal adenopathy. 3. Right middle lobe 4 mm solid pulmonary nodule, for which follow-up chest CT is advised in 3-6 months. 4. Several findings that are equivocal for distant metastatic disease. Vaguely nodular heterogeneous hyperenhancement in the peripheral right liver lobe, which could represent benign transient perfusional phenomena, with underlying liver lesions not entirely excluded. Mildly sclerotic T12 vertebral lesion. Mildly enlarged right axillary lymph node. The best single test to further evaluate these findings would be a PET-CT. Alternative tests include bone scan or thoracic MRI without and with IV contrast for the T12 osseous lesion, MRI abdomen without and with IV contrast for the liver findings, and diagnostic mammographic evaluation for the right axillary node. 5.  Aortic Atherosclerosis (ICD10-I70.0).    01/09/2018 PET scan   1. Moderate hypermetabolism corresponding to enlarging axillary node since 12/22/2017. Highly suspicious for an atypical distribution of metastatic disease. 2. No hypermetabolism to suggest hepatic or T12 osseous metastasis. 3. Hypermetabolic endometrial primary.   01/23/2018 Initial Diagnosis   Endometrial cancer ([place])   01/23/2018 Pathology Results   1. Lymph node, sentinel, biopsy, left external iliac - NO CARCINOMA IDENTIFIED IN ONE  LYMPH NODE (0/1) - SEE COMMENT 2.  Lymph nodes, regional resection, right para aortic - NO CARCINOMA IDENTIFIED IN FOUR LYMPH NODES (0/4) - SEE COMMENT 3. Lymph nodes, regional resection, right pelvic - NO CARCINOMA IDENTIFIED IN EIGHT LYMPH NODES (0/8) - SEE COMMENT 4. Cul-de-sac biopsy - METASTATIC CARCINOMA 5. Uterus +/- tubes/ovaries, neoplastic, cervix, bilateral fallopian tubes and ovaries UTERUS: - MIXED SEROUS AND ENDOMETRIOID CARCINOMA - SEROSAL IMPLANTS PRESENT - LYMPHOVASCULAR SPACE INVASION PRESENT - LEIOMYOMATA (1.5 CM; LARGEST) - SEE ONCOLOGY TABLE AND COMMENT BELOW CERVIX: - BENIGN NABOTHIAN CYSTS - NO CARCINOMA IDENTIFIED BILATERAL OVARIES: - METASTATIC CARCINOMA PRESENT ON OVARIAN SURFACE BILATERAL FALLOPIAN TUBES: - INTRALUMINAL CARCINOMA Microscopic Comment 1. -3. Cytokeratin AE1/3 was performed on the sentinel lymph nodes to exclude micrometastasis. There is no evidence of metastatic carcinoma by immunohistochemistry. 5. 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%) Histologic Grade: N/A Myometrial invasion: Estimated less than 50% myometrial invasion (0.3 cm of myometrium involved; 1.4 cm measured thickness) Uterine Serosa Involvement: Present Cervical stromal involvement: Not identified Extent of involvement of other organs: - Fallopian tube (left within the lumen) - Ovary, left (surface involvement) - Cul-de-sac Lymphovascular invasion: Present Regional Lymph Nodes: Examined: 1 Sentinel 12 Non-sentinel 13 Total Lymph nodes with metastasis: 0 Isolated tumor cells (< 0.2 mm): 0 Micrometastasis: (> 0.2 mm and < 2.0 mm): 0 Macrometastasis: (> 2.0 mm): 0 Extracapsular extension: N/A Tumor block for ancillary studies: 5E, 5B MMR / MSI testing: Pending will be  reported separately Pathologic Stage Classification (pTNM, AJCC 8th edition): pT3a, pN0 FIGO Stage: IIIA COMMENT: There is tumor present on the surface of the left ovary and within the lumen of the left fallopian tube. The carcinoma appears to be mixed with the largest component being high grade serous carcinoma. Dr. Lyndon Code reviewed the case and agrees with the above diagnosis.   01/23/2018 Surgery   Surgeon: Donaciano Eva   Operation: Robotic-assisted laparoscopic total hysterectomy with bilateral salpingoophorectomy, SLN injection, mapping and biopsy, right pelvic and para-aortic lymphadenectomy  Operative Findings:  : 10-12cm bulky uterus with frank serosal involvement on posterior cul de sac peritoneum and anterior peritoneum which adhesed the bladder to the anterior uterus. Unilateral mapping on left pelvis. No grossly suspicious nodes. Normal omentum and diaphragms.    02/01/2018 Pathology Results   Lymph node, needle/core biopsy, right axilla - METASTATIC CARCINOMA - SEE COMMENT Microscopic Comment The neoplastic cells are positive for cytokeratin 7 and Pax-8 but negative for cytokeratin 5/6, cytokeratin 20, Gata-3, p63, p53 and GCDFP. Overall, the immunoprofile is consistent with metastasis from the patient's known gynecologic carcinoma.   02/01/2018 Procedure   Ultrasound-guided core biopsies of a suspicious right axillary lymph node.   02/21/2018 - 06/13/2018 Chemotherapy   The patient had carboplatin & Taxol x 6   03/26/2018 - 04/30/2018 Radiation Therapy   Radiation treatment dates:   03/26/18, 04/02/18, 04/19/18, 04/23/18 04/30/18  Site/dose: proximal Vagina, 6 Gy in 5 fractions for a total dose of 30 Gy    04/26/2018 PET scan   1. Interval resection of the hypermetabolic endometrial primary. No discernible hypermetabolic pelvic sidewall or peritoneal metastases. 2. Stable hypermetabolic bilateral axillary lymphadenopathy. The degree of hypermetabolism in these lymph nodes  remains highly suspicious for neoplasm. 3. Interval development of low level FDG uptake in 2 stable, small lymph nodes in the left groin region.  This may be reactive, but close attention recommended to exclude metastatic involvement. 4. Stable non hypermetabolic low-density liver lesions and the mixed lucent and sclerotic T12 lesion is stable without hypermetabolism today.   07/09/2018 Imaging   Status post hysterectomy.  Mild axillary lymphadenopathy, improved. Nodal metastases not excluded.  Irregular wall thickening involving the anterior bladder, possibly reflecting radiation cystitis versus tumor.  Additional stable findings as above, including a 5 mm right middle lobe nodule and stable sclerosis involving the T12 vertebral body.   10/10/2018 Imaging   1. Stable exam. No findings within the abdomen or pelvis to suggest metastatic disease. 2. Unchanged appearance of sclerosis involving the T12 vertebra. 3.  Aortic Atherosclerosis (ICD10-I70.0).   10/10/2018 Imaging   Ct abdomen and pelvis 1. Stable exam. No findings within the abdomen or pelvis to suggest metastatic disease. 2. Unchanged appearance of sclerosis involving the T12 vertebra. 3.  Aortic Atherosclerosis (ICD10-I70.0).   01/16/2019 PET scan   1. Enlargement and increased activity in the left axillary, right axillary, and left inguinal adenopathy compatible with progressive malignancy. 2. Stable 4 mm right middle lobe subpleural nodule, not appreciably hypermetabolic. 3. Other imaging findings of potential clinical significance: Right kidney lower pole cyst. Aortic Atherosclerosis (ICD10-I70.0). Prominent stool throughout the colon favors constipation. Mild chronic left maxillary sinusitis.     02/01/2019 -  Chemotherapy   The patient had pembrolizumab and Lenvima for chemotherapy treatment.     04/25/2019 Imaging   Ct imaging 1. Interval response to therapy. Decrease in size of bilateral axillary and left inguinal  lymph nodes. No new or progressive findings identified. 2. Stable sclerotic appearance of the T12 vertebra. 3. Aortic atherosclerosis. Lad coronary artery calcification noted.   08/30/2019 Imaging   1. Bulky RIGHT hilar lymph node, slightly increased in size from previous imaging. 2. Multiple foci of hyperenhancement in the liver. 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 Consider MRI liver on follow-up to establish a baseline for number of lesions as these will appear variable on CT follow-up based on phase of contrast acquisition in the future. 3. Stable 5 mm nodule adjacent to the fissure, minor fissure in the RIGHT middle lobe.     11/28/2019 Imaging   1. Stable exam. No new or progressive findings. 2. Stable enlarged right axillary lymph node. 3. Stable tiny bilateral pulmonary nodules. Continued attention on follow-up recommended. 4. Scattered hypodensities in the liver parenchyma correspond to the hypervascular lesions seen on the previous study performed with intravenous contrast material. 5. Right renal cyst. 6. Aortic Atherosclerosis (ICD10-I70.0).     03/16/2020 Imaging   Increased echogenicity of renal parenchyma is noted bilaterally suggesting medical renal disease. No hydronephrosis or renal obstruction is noted     REVIEW OF SYSTEMS:   Constitutional: Denies fevers, chills or abnormal weight loss Eyes: Denies blurriness of vision Ears, nose, mouth, throat, and face: Denies mucositis or sore throat Respiratory: Denies cough, dyspnea or wheezes Cardiovascular: Denies palpitation, chest discomfort Gastrointestinal:  Denies nausea, heartburn or change in bowel habits Skin: Denies abnormal skin rashes Lymphatics: Denies new lymphadenopathy or easy bruising Neurological:Denies  numbness, tingling or new weaknesses Behavioral/Psych: Mood is stable, no new changes  Extremities: No lower extremity edema All other systems were reviewed with the patient and are negative.  I have reviewed  the past medical history, past surgical history, social history and family history with the patient and they are unchanged from previous note.  ALLERGIES:  is allergic to sulfa antibiotics and sulfamethoxazole.  MEDICATIONS:  Current Outpatient Medications  Medication Sig Dispense Refill  . insulin isophane & regular human (NOVOLIN 70/30 FLEXPEN RELION) (70-30) 100 UNIT/ML KwikPen Inject 14 Units into the skin daily with breakfast. 15 mL 3  . ketoconazole (NIZORAL) 2 % cream Apply to web space twice a day for 1 week then daily for 1 month 15 g 0  . lenvatinib 10 mg daily dose (LENVIMA, 10 MG DAILY DOSE,) capsule Take 1 capsule (10 mg total) by mouth daily. 30 capsule 11  . levothyroxine (SYNTHROID) 75 MCG tablet Take 1 tablet (75 mcg total) by mouth daily before breakfast. 30 tablet 11  . lidocaine-prilocaine (EMLA) cream Apply to affected area once 30 g 3  . ondansetron (ZOFRAN) 8 MG tablet Take 1 tablet (8 mg total) by mouth daily. 30 tablet 1  . OneTouch Delica Lancets 02H MISC 1 each by Other route 2 (two) times daily. Use to monitor glucose levels BID; E11.65 100 each 2  . ONETOUCH VERIO test strip 1 each by Other route 3 (three) times daily. And lancets 3/day 300 each 3  . RELION PEN NEEDLES 32G X 4 MM MISC USE AS DIRECTED 50 each 0  . simvastatin (ZOCOR) 10 MG tablet Take 10 mg by mouth at bedtime.     . Vitamin D, Ergocalciferol, 2000 units CAPS Take 2,000 Units by mouth daily.   0   No current facility-administered medications for this visit.    PHYSICAL EXAMINATION: ECOG PERFORMANCE STATUS: 0 - Asymptomatic  LABORATORY DATA:  I have reviewed the data as listed CMP Latest Ref Rng & Units 03/24/2020 03/16/2020 03/13/2020  Glucose 70 - 99 mg/dL 142(H) 131(H) 120(H)  BUN 8  - 23 mg/dL 41(H) 50(H) 63(H)  Creatinine 0.44 - 1.00 mg/dL 2.82(H) 3.43(HH) 3.94(HH)  Sodium 135 - 145 mmol/L 142 141 142  Potassium 3.5 - 5.1 mmol/L 4.4 3.8 4.1  Chloride 98 - 111 mmol/L 112(H) 111 112(H)  CO2 22 - 32 mmol/L 19(L) 18(L) 18(L)  Calcium 8.9 - 10.3 mg/dL 9.3 8.8(L) 9.3  Total Protein 6.5 - 8.1 g/dL 6.7 6.3(L) 6.5  Total Bilirubin 0.3 - 1.2 mg/dL 0.4 0.4 0.4  Alkaline Phos 38 - 126 U/L 67 69 67  AST 15 - 41 U/L 17 14(L) 11(L)  ALT 0 - 44 U/L _0 Lab Results  Component Value Date   WBC 5.8 03/24/2020   HGB 10.4 (L) 03/24/2020   HCT 32.9 (L) 03/24/2020   MCV 90.9 03/24/2020   PLT 179 03/24/2020   NEUTROABS 4.1 03/24/2020     RADIOGRAPHIC STUDIES: I have personally reviewed the radiological images as listed and agreed with the findings in the report. US RENAL  Result Date: 03/16/2020 CLINICAL DATA:  Acute renal failure. EXAM: RENAL / URINARY TRACT ULTRASOUND COMPLETE COMPARISON:  None. FINDINGS: Right Kidney: Renal measurements: 11.9 x 6.4 x 5.3 cm = volume: 210 mL. Increased echogenicity of renal parenchyma is noted suggesting medical renal disease. 3.8 cm simple cyst is noted. No mass or hydronephrosis visualized. Left Kidney: Renal measurements: 11.0 x 5.5 x 5.2 cm = volume: 165 mL. Increased echogenicity of renal parenchyma is noted suggesting medical renal disease. No mass or hydronephrosis visualized. Bladder: Appears normal for degree of bladder distention. Other: None. IMPRESSION: Increased echogenicity of renal parenchyma  is noted bilaterally suggesting medical renal disease. No hydronephrosis or renal obstruction is noted. Electronically Signed   By: Marijo Conception M.D.   On: 03/16/2020 11:35    I discussed the assessment and treatment plan with the patient. The patient was provided an opportunity to ask questions and all were answered. The patient agreed with the plan and demonstrated an understanding of the instructions. The patient was advised to call  back or seek an in-person evaluation if the symptoms worsen or if the condition fails to improve as anticipated.    I spent 20 minutes for the appointment reviewing test results, discuss management and coordination of care.  [name], MD 03/24/2020 3:30 PM

## 2020-03-24 NOTE — Assessment & Plan Note (Signed)
This is likely multifactorial related to anemia chronic disease It is not likely due to her treatment She is not symptomatic.  Observe only.   

## 2020-03-24 NOTE — Telephone Encounter (Signed)
She called and requested copy of recent labs and ultrasound to PCP.  Faxed above to Dr. Jonathon Jordan, PCP at [phone]. Received confirmation.

## 2020-03-24 NOTE — Telephone Encounter (Signed)
Oral Oncology Patient Advocate Encounter  Met patient in lobby to complete re-enrollment application for [employer] in an effort to reduce patient's out of pocket expense for Lenvim to $0.    Application completed and faxed to [phone].   [employer] patient assistance phone number for follow up is [phone].   This encounter will be updated until final determination.    [facility] Patient [place] Phone [phone] Fax [phone] 03/24/2020 10:37 AM

## 2020-03-26 DIAGNOSIS — I10 Essential (primary) hypertension: Secondary | ICD-10-CM | POA: Diagnosis not present

## 2020-03-29 ENCOUNTER — Other Ambulatory Visit: Payer: Self-pay | Admitting: Endocrinology

## 2020-03-29 DIAGNOSIS — Z794 Long term (current) use of insulin: Secondary | ICD-10-CM

## 2020-03-29 DIAGNOSIS — E1122 Type 2 diabetes mellitus with diabetic chronic kidney disease: Secondary | ICD-10-CM

## 2020-04-02 ENCOUNTER — Other Ambulatory Visit: Payer: Self-pay

## 2020-04-02 ENCOUNTER — Ambulatory Visit (HOSPITAL_COMMUNITY)
Admission: RE | Admit: 2020-04-02 | Discharge: 2020-04-02 | Disposition: A | Discharge status: Home / Self Care | Payer: Medicare Other | Source: Ambulatory Visit | Attending: Hematology and Oncology | Admitting: Hematology and Oncology

## 2020-04-02 ENCOUNTER — Inpatient Hospital Stay: Payer: Medicare Other

## 2020-04-02 DIAGNOSIS — C541 Malignant neoplasm of endometrium: Secondary | ICD-10-CM | POA: Insufficient documentation

## 2020-04-02 DIAGNOSIS — Z5112 Encounter for antineoplastic immunotherapy: Secondary | ICD-10-CM | POA: Diagnosis not present

## 2020-04-02 DIAGNOSIS — Z923 Personal history of irradiation: Secondary | ICD-10-CM | POA: Diagnosis not present

## 2020-04-02 DIAGNOSIS — C773 Secondary and unspecified malignant neoplasm of axilla and upper limb lymph nodes: Secondary | ICD-10-CM | POA: Diagnosis not present

## 2020-04-02 DIAGNOSIS — Z452 Encounter for adjustment and management of vascular access device: Secondary | ICD-10-CM | POA: Diagnosis not present

## 2020-04-02 DIAGNOSIS — Z9221 Personal history of antineoplastic chemotherapy: Secondary | ICD-10-CM | POA: Diagnosis not present

## 2020-04-02 MED ORDER — HEPARIN SOD (PORK) LOCK FLUSH 100 UNIT/ML IV SOLN
INTRAVENOUS | Status: AC
  Filled 2020-04-02: qty 5

## 2020-04-02 MED ORDER — HEPARIN SOD (PORK) LOCK FLUSH 100 UNIT/ML IV SOLN
500.0000 [IU] | Freq: Once | INTRAVENOUS | Status: AC
  Administered 2020-04-02: 500 [IU] via INTRAVENOUS

## 2020-04-02 MED ORDER — SODIUM CHLORIDE 0.9% FLUSH
10.0000 mL | Freq: Once | INTRAVENOUS | Status: AC
  Administered 2020-04-02: 10 mL
  Filled 2020-04-02: qty 10

## 2020-04-02 NOTE — Patient Instructions (Signed)

## 2020-04-03 ENCOUNTER — Other Ambulatory Visit: Payer: Self-pay

## 2020-04-03 ENCOUNTER — Other Ambulatory Visit: Payer: Medicare Other

## 2020-04-03 ENCOUNTER — Telehealth: Payer: Self-pay

## 2020-04-03 ENCOUNTER — Encounter: Payer: Self-pay | Admitting: Hematology and Oncology

## 2020-04-03 ENCOUNTER — Other Ambulatory Visit: Payer: Self-pay | Admitting: Hematology and Oncology

## 2020-04-03 ENCOUNTER — Inpatient Hospital Stay (HOSPITAL_BASED_OUTPATIENT_CLINIC_OR_DEPARTMENT_OTHER): Payer: Medicare Other | Admitting: Hematology and Oncology

## 2020-04-03 ENCOUNTER — Inpatient Hospital Stay: Payer: Medicare Other

## 2020-04-03 DIAGNOSIS — Z7189 Other specified counseling: Secondary | ICD-10-CM

## 2020-04-03 DIAGNOSIS — Z452 Encounter for adjustment and management of vascular access device: Secondary | ICD-10-CM | POA: Diagnosis not present

## 2020-04-03 DIAGNOSIS — C773 Secondary and unspecified malignant neoplasm of axilla and upper limb lymph nodes: Secondary | ICD-10-CM | POA: Diagnosis not present

## 2020-04-03 DIAGNOSIS — C541 Malignant neoplasm of endometrium: Secondary | ICD-10-CM | POA: Diagnosis not present

## 2020-04-03 DIAGNOSIS — E039 Hypothyroidism, unspecified: Secondary | ICD-10-CM

## 2020-04-03 DIAGNOSIS — Z923 Personal history of irradiation: Secondary | ICD-10-CM | POA: Diagnosis not present

## 2020-04-03 DIAGNOSIS — N183 Chronic kidney disease, stage 3 unspecified: Secondary | ICD-10-CM | POA: Diagnosis not present

## 2020-04-03 DIAGNOSIS — I10 Essential (primary) hypertension: Secondary | ICD-10-CM | POA: Diagnosis not present

## 2020-04-03 DIAGNOSIS — Z5112 Encounter for antineoplastic immunotherapy: Secondary | ICD-10-CM | POA: Diagnosis not present

## 2020-04-03 DIAGNOSIS — Z9221 Personal history of antineoplastic chemotherapy: Secondary | ICD-10-CM | POA: Diagnosis not present

## 2020-04-03 LAB — CBC WITH DIFFERENTIAL/PLATELET
Abs Immature Granulocytes: 0.02 10*3/uL (ref 0.00–0.07)
Basophils Absolute: 0 10*3/uL (ref 0.0–0.1)
Basophils Relative: 0 %
Eosinophils Absolute: 0 10*3/uL (ref 0.0–0.5)
Eosinophils Relative: 1 %
HCT: 30.8 % — ABNORMAL LOW (ref 36.0–46.0)
Hemoglobin: 9.9 g/dL — ABNORMAL LOW (ref 12.0–15.0)
Immature Granulocytes: 1 %
Lymphocytes Relative: 24 %
Lymphs Abs: 1 10*3/uL (ref 0.7–4.0)
MCH: 29 pg (ref 26.0–34.0)
MCHC: 32.1 g/dL (ref 30.0–36.0)
MCV: 90.3 fL (ref 80.0–100.0)
Monocytes Absolute: 0.2 10*3/uL (ref 0.1–1.0)
Monocytes Relative: 5 %
Neutro Abs: 2.9 10*3/uL (ref 1.7–7.7)
Neutrophils Relative %: 69 %
Platelets: 180 10*3/uL (ref 150–400)
RBC: 3.41 MIL/uL — ABNORMAL LOW (ref 3.87–5.11)
RDW: 13.7 % (ref 11.5–15.5)
WBC: 4.2 10*3/uL (ref 4.0–10.5)
nRBC: 0 % (ref 0.0–0.2)

## 2020-04-03 LAB — TSH: TSH: 4.932 u[IU]/mL — ABNORMAL HIGH (ref 0.308–3.960)

## 2020-04-03 LAB — COMPREHENSIVE METABOLIC PANEL
ALT: 13 U/L (ref 0–44)
AST: 15 U/L (ref 15–41)
Albumin: 3.3 g/dL — ABNORMAL LOW (ref 3.5–5.0)
Alkaline Phosphatase: 66 U/L (ref 38–126)
Anion gap: 7 (ref 5–15)
BUN: 28 mg/dL — ABNORMAL HIGH (ref 8–23)
CO2: 23 mmol/L (ref 22–32)
Calcium: 9.3 mg/dL (ref 8.9–10.3)
Chloride: 112 mmol/L — ABNORMAL HIGH (ref 98–111)
Creatinine, Ser: 2.18 mg/dL — ABNORMAL HIGH (ref 0.44–1.00)
GFR, Estimated: 24 mL/min — ABNORMAL LOW (ref >60)
Glucose, Bld: 96 mg/dL (ref 70–99)
Potassium: 4.3 mmol/L (ref 3.5–5.1)
Sodium: 142 mmol/L (ref 135–145)
Total Bilirubin: 0.5 mg/dL (ref 0.3–1.2)
Total Protein: 6.4 g/dL — ABNORMAL LOW (ref 6.5–8.1)

## 2020-04-03 MED ORDER — SODIUM CHLORIDE 0.9 % IV SOLN
200.0000 mg | Freq: Once | INTRAVENOUS | Status: AC
  Administered 2020-04-03: 200 mg via INTRAVENOUS
  Filled 2020-04-03: qty 8

## 2020-04-03 MED ORDER — SODIUM CHLORIDE 0.9 % IV SOLN
Freq: Once | INTRAVENOUS | Status: AC
  Filled 2020-04-03: qty 250

## 2020-04-03 MED ORDER — SODIUM CHLORIDE 0.9% FLUSH
10.0000 mL | INTRAVENOUS | Status: DC | PRN
  Administered 2020-04-03: 10 mL
  Filled 2020-04-03: qty 10

## 2020-04-03 MED ORDER — LEVOTHYROXINE SODIUM 88 MCG PO TABS: 88.0000 ug | ORAL_TABLET | Freq: Every day | ORAL | 3 refills | Status: DC

## 2020-04-03 MED ORDER — HEPARIN SOD (PORK) LOCK FLUSH 100 UNIT/ML IV SOLN
500.0000 [IU] | Freq: Once | INTRAVENOUS | Status: AC | PRN
  Administered 2020-04-03: 500 [IU]
  Filled 2020-04-03: qty 5

## 2020-04-03 NOTE — Assessment & Plan Note (Signed)
I have reviewed multiple CT imaging with the patient She has achieved stable disease control So far, she tolerated treatment well except for fluctuation of her blood pressure and recent renal function which I do not believe related to side effects of treatment We will continue treatment indefinitely I plan to repeat imaging study in May 2022

## 2020-04-03 NOTE — Patient Instructions (Signed)
[facility] Discharge Instructions for Patients Receiving Chemotherapy  Today you received the following chemotherapy agents:  Keytruda.  To help prevent nausea and vomiting after your treatment, we encourage you to take your nausea medication as directed.   If you develop nausea and vomiting that is not controlled by your nausea medication, call the clinic.   BELOW ARE SYMPTOMS THAT SHOULD BE REPORTED IMMEDIATELY:  *FEVER GREATER THAN 100.5 F  *CHILLS WITH OR WITHOUT FEVER  NAUSEA AND VOMITING THAT IS NOT CONTROLLED WITH YOUR NAUSEA MEDICATION  *UNUSUAL SHORTNESS OF BREATH  *UNUSUAL BRUISING OR BLEEDING  TENDERNESS IN MOUTH AND THROAT WITH OR WITHOUT PRESENCE OF ULCERS  *URINARY PROBLEMS  *BOWEL PROBLEMS  UNUSUAL RASH Items with * indicate a potential emergency and should be followed up as soon as possible.  Feel free to call the clinic should you have any questions or concerns. The clinic phone number is [phone].  Please show the CHEMO ALERT CARD at check-in to the Emergency Department and triage nurse.    

## 2020-04-03 NOTE — Progress Notes (Signed)
Per Dr. Alvy Bimler, okay to treat without labs today.

## 2020-04-03 NOTE — Assessment & Plan Note (Signed)
Recent renal function is stable We discussed the importance of aggressive risk factor modification

## 2020-04-03 NOTE — Telephone Encounter (Signed)
Called and given below message. She verbalized understanding. 

## 2020-04-03 NOTE — Telephone Encounter (Signed)
-----   Message from [name], MD sent at 04/03/2020 12:05 PM EST ----- Regarding: TSH TSH is high so I need to increase the dose to 90mcg, sent to her pharmacy Tell her to start the new dose and keep the old ones in case we need to adjust her dose

## 2020-04-03 NOTE — Assessment & Plan Note (Signed)
I have discontinue one of her blood pressure medication recently She is following closely with her primary care doctor and is prescribed amlodipine

## 2020-04-03 NOTE — Progress Notes (Signed)
[facility] OFFICE PROGRESS NOTE  Patient Care Team: Jonathon Jordan, MD as PCP - General (Family Medicine) Awanda Mink Craige Cotta, RN as Registered Nurse (Oncology)  ASSESSMENT & PLAN:  Endometrial cancer [facility]) I have reviewed multiple CT imaging with the patient She has achieved stable disease control So far, she tolerated treatment well except for fluctuation of her blood pressure and recent renal function which I do not believe related to side effects of treatment We will continue treatment indefinitely I plan to repeat imaging study in May 2022  Essential hypertension I have discontinue one of her blood pressure medication recently She is following closely with her primary care doctor and is prescribed amlodipine  Chronic kidney disease (CKD), stage III (moderate) ([place]) Recent renal function is stable We discussed the importance of aggressive risk factor modification   No orders of the defined types were placed in this encounter.   All questions were answered. The patient knows to call the clinic with any problems, questions or concerns. The total time spent in the appointment was 20 minutes encounter with patients including review of chart and various tests results, discussions about plan of care and coordination of care plan   [name], MD 04/03/2020 9:25 AM  INTERVAL HISTORY: Please see below for problem oriented charting. 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 Leo Grosser who obtained an endometrial biopsy showing poorly differentiated endometrial carcinoma and negative endocervical curettage   12/12/2017 Imaging   MAMMOGRAM FINDINGS: In the right axilla, a possible mass warrants further evaluation. In the left breast, no findings suspicious for malignancy.  Images were processed with CAD.  IMPRESSION: Further evaluation is suggested for possible mass in the right axilla.    12/24/2017 Imaging   Ct scan chest, abdomen and pelvis 1. Marked thickening of the endometrium (42 mm) compatible with known primary endometrial malignancy. No evidence of extrauterine invasion. 2. No pelvic or retroperitoneal adenopathy. 3. Right middle lobe 4 mm solid pulmonary nodule, for which follow-up chest CT is advised in 3-6 months. 4. Several findings that are equivocal for distant metastatic disease. Vaguely nodular heterogeneous hyperenhancement in the peripheral right liver lobe, which could represent benign transient perfusional phenomena, with underlying liver lesions not entirely excluded. Mildly sclerotic T12 vertebral lesion. Mildly enlarged right axillary lymph node. The best single test to further evaluate these findings would be a PET-CT. Alternative tests include bone scan or thoracic MRI without and with IV contrast for the T12 osseous lesion, MRI abdomen without and with IV contrast for the liver findings, and diagnostic mammographic evaluation for the right axillary node. 5.  Aortic Atherosclerosis (ICD10-I70.0).    01/09/2018 PET scan   1. Moderate hypermetabolism corresponding to enlarging axillary node since 12/22/2017. Highly suspicious for an atypical distribution of metastatic disease. 2. No hypermetabolism to suggest hepatic or T12 osseous metastasis. 3. Hypermetabolic endometrial primary.   01/23/2018 Initial Diagnosis   Endometrial cancer ([place])   01/23/2018  Pathology  Results   1. Lymph node, sentinel, biopsy, left external iliac - NO CARCINOMA IDENTIFIED IN ONE LYMPH NODE (0/1) - SEE COMMENT 2. Lymph nodes, regional resection, right para aortic - NO CARCINOMA IDENTIFIED IN FOUR LYMPH NODES (0/4) - SEE COMMENT 3. Lymph nodes, regional resection, right pelvic - NO CARCINOMA IDENTIFIED IN EIGHT LYMPH NODES (0/8) - SEE COMMENT 4. Cul-de-sac biopsy - METASTATIC CARCINOMA 5. Uterus +/- tubes/ovaries, neoplastic, cervix, bilateral fallopian tubes and ovaries UTERUS: - MIXED SEROUS AND ENDOMETRIOID CARCINOMA - SEROSAL IMPLANTS PRESENT - LYMPHOVASCULAR SPACE INVASION PRESENT - LEIOMYOMATA (1.5 CM; LARGEST) - SEE ONCOLOGY TABLE AND COMMENT BELOW CERVIX: - BENIGN NABOTHIAN CYSTS - NO CARCINOMA IDENTIFIED BILATERAL OVARIES: - METASTATIC CARCINOMA PRESENT ON OVARIAN SURFACE BILATERAL FALLOPIAN TUBES: - INTRALUMINAL CARCINOMA Microscopic Comment 1. -3. Cytokeratin AE1/3 was performed on the sentinel lymph nodes to exclude micrometastasis. There is no evidence of metastatic carcinoma by immunohistochemistry. 5. 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%) Histologic Grade: N/A Myometrial invasion: Estimated less than 50% myometrial invasion (0.3 cm of myometrium involved; 1.4 cm measured thickness) Uterine Serosa Involvement: Present Cervical stromal involvement: Not identified Extent of involvement of other organs: - Fallopian tube (left within the lumen) - Ovary, left (surface involvement) - Cul-de-sac Lymphovascular invasion: Present Regional Lymph Nodes: Examined: 1 Sentinel 12 Non-sentinel 13 Total Lymph nodes with metastasis: 0 Isolated tumor cells (< 0.2 mm): 0 Micrometastasis: (> 0.2 mm and < 2.0 mm):  0 Macrometastasis: (> 2.0 mm): 0 Extracapsular extension: N/A Tumor block for ancillary studies: 5E, 5B MMR / MSI testing: Pending will be reported separately Pathologic Stage Classification (pTNM, AJCC 8th edition): pT3a, pN0 FIGO Stage: IIIA COMMENT: There is tumor present on the surface of the left ovary and within the lumen of the left fallopian tube. The carcinoma appears to be mixed with the largest component being high grade serous carcinoma. Dr. Lyndon Code reviewed the case and agrees with the above diagnosis.   01/23/2018 Surgery   Surgeon: Donaciano Eva   Operation: Robotic-assisted laparoscopic total hysterectomy with bilateral salpingoophorectomy, SLN injection, mapping and biopsy, right pelvic and para-aortic lymphadenectomy  Operative Findings:  : 10-12cm bulky uterus with frank serosal involvement on posterior cul de sac peritoneum and anterior peritoneum which adhesed the bladder to the anterior uterus. Unilateral mapping on left pelvis. No grossly suspicious nodes. Normal omentum and diaphragms.    02/01/2018 Pathology Results   Lymph node, needle/core biopsy, right axilla - METASTATIC CARCINOMA - SEE COMMENT Microscopic Comment The neoplastic cells are positive for cytokeratin 7 and Pax-8 but negative for cytokeratin 5/6, cytokeratin 20, Gata-3, p63, p53 and GCDFP. Overall, the immunoprofile is consistent with metastasis from the patient's known gynecologic carcinoma.   02/01/2018 Procedure   Ultrasound-guided core biopsies of a suspicious right axillary lymph node.   02/21/2018 - 06/13/2018 Chemotherapy   The patient had carboplatin & Taxol x 6   03/26/2018 - 04/30/2018 Radiation Therapy   Radiation treatment dates:   03/26/18, 04/02/18, 04/19/18, 04/23/18 04/30/18  Site/dose: proximal Vagina, 6 Gy in 5 fractions for a total dose of 30 Gy    04/26/2018 PET scan   1. Interval resection of the hypermetabolic endometrial primary. No discernible hypermetabolic pelvic  sidewall or peritoneal metastases. 2. Stable hypermetabolic bilateral axillary lymphadenopathy. The degree of hypermetabolism in these lymph nodes remains highly suspicious for neoplasm. 3. Interval development  of low level FDG uptake in 2 stable, small lymph nodes in the left groin region. This may be reactive, but close attention recommended to exclude metastatic involvement. 4. Stable non hypermetabolic low-density liver lesions and the mixed lucent and sclerotic T12 lesion is stable without hypermetabolism today.   07/09/2018 Imaging   Status post hysterectomy.  Mild axillary lymphadenopathy, improved. Nodal metastases not excluded.  Irregular wall thickening involving the anterior bladder, possibly reflecting radiation cystitis versus tumor.  Additional stable findings as above, including a 5 mm right middle lobe nodule and stable sclerosis involving the T12 vertebral body.   10/10/2018 Imaging   1. Stable exam. No findings within the abdomen or pelvis to suggest metastatic disease. 2. Unchanged appearance of sclerosis involving the T12 vertebra. 3.  Aortic Atherosclerosis (ICD10-I70.0).   10/10/2018 Imaging   Ct abdomen and pelvis 1. Stable exam. No findings within the abdomen or pelvis to suggest metastatic disease. 2. Unchanged appearance of sclerosis involving the T12 vertebra. 3.  Aortic Atherosclerosis (ICD10-I70.0).   01/16/2019 PET scan   1. Enlargement and increased activity in the left axillary, right axillary, and left inguinal adenopathy compatible with progressive malignancy. 2. Stable 4 mm right middle lobe subpleural nodule, not appreciably hypermetabolic. 3. Other imaging findings of potential clinical significance: Right kidney lower pole cyst. Aortic Atherosclerosis (ICD10-I70.0). Prominent stool throughout the colon favors constipation. Mild chronic left maxillary sinusitis.     02/01/2019 -  Chemotherapy   The patient had pembrolizumab and Lenvima for  chemotherapy treatment.     04/25/2019 Imaging   Ct imaging 1. Interval response to therapy. Decrease in size of bilateral axillary and left inguinal lymph nodes. No new or progressive findings identified. 2. Stable sclerotic appearance of the T12 vertebra. 3. Aortic atherosclerosis. Lad coronary artery calcification noted.   08/30/2019 Imaging   1. Bulky RIGHT hilar lymph node, slightly increased in size from previous imaging. 2. Multiple foci of hyperenhancement in the liver. 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 Consider MRI liver on follow-up to establish a baseline for number of lesions as these will appear variable on CT follow-up based on phase of contrast acquisition in the future. 3. Stable 5 mm nodule adjacent to the fissure, minor fissure in the RIGHT middle lobe.     11/28/2019 Imaging   1. Stable exam. No new or progressive findings. 2. Stable enlarged right axillary lymph node. 3. Stable tiny bilateral pulmonary nodules. Continued attention on follow-up recommended. 4. Scattered hypodensities in the liver parenchyma correspond to the hypervascular lesions seen on the previous study performed with intravenous contrast material. 5. Right renal cyst. 6. Aortic Atherosclerosis (ICD10-I70.0).     03/16/2020 Imaging   Increased echogenicity of renal parenchyma is noted bilaterally suggesting medical renal disease. No hydronephrosis or renal obstruction is noted   04/02/2020 Imaging   1. Right axillary lymphadenopathy is stable. Tiny bilateral pulmonary nodules are stable. Subcentimeter low-attenuation liver lesions are stable. 2. No new or progressive metastatic disease in the chest, abdomen or pelvis. 3. Aortic Atherosclerosis (ICD10-I70.0).     REVIEW OF SYSTEMS:    Constitutional: Denies fevers, chills or abnormal weight loss Eyes: Denies blurriness of vision Ears, nose, mouth, throat, and face: Denies mucositis or sore throat Respiratory: Denies cough, dyspnea or wheezes Cardiovascular: Denies palpitation, chest  discomfort or lower extremity swelling Gastrointestinal:  Denies nausea, heartburn or change in bowel habits Skin: Denies abnormal skin rashes Lymphatics: Denies new lymphadenopathy or easy bruising Neurological:Denies numbness, tingling or new weaknesses Behavioral/Psych: Mood is stable, no new changes  All other systems were reviewed with the patient and are negative.  I have reviewed the past medical history, past surgical history, social history and family history with the patient and they are unchanged from previous note.  ALLERGIES:  is allergic to sulfa antibiotics and sulfamethoxazole.  MEDICATIONS:  Current Outpatient Medications  Medication Sig Dispense Refill  . amLODipine (NORVASC) 2.5 MG tablet Take 2.5 mg by mouth daily.    . insulin isophane & regular human (NOVOLIN 70/30 FLEXPEN) (70-30) 100 UNIT/ML KwikPen Inject 14 Units into the skin daily with breakfast. 15 mL 3  . ketoconazole (NIZORAL) 2 % cream Apply to web space twice a day for 1 week then daily for 1 month 15 g 0  . lenvatinib 10 mg daily dose (LENVIMA, 10 MG DAILY DOSE,) capsule Take 1 capsule (10 mg total) by mouth daily. 30 capsule 11  . levothyroxine (SYNTHROID) 75 MCG tablet Take 1 tablet (75 mcg total) by mouth daily before breakfast. 30 tablet 11  . lidocaine-prilocaine (EMLA) cream Apply to affected area once 30 g 3  . ondansetron (ZOFRAN) 8 MG tablet Take 1 tablet (8 mg total) by mouth daily. 30 tablet 1  . OneTouch Delica Lancets 03K MISC 1 each by Other route 2 (two) times daily. Use to monitor glucose levels BID; E11.65 100 each 2  . ONETOUCH VERIO test strip 1 each by Other route 3 (three) times daily. And lancets 3/day 300 each 3  . RELION PEN  NEEDLES 32G X 4 MM MISC USE AS DIRECTED 50 each 0  . simvastatin (ZOCOR) 10 MG tablet Take 10 mg by mouth at bedtime.     . Vitamin D, Ergocalciferol, 2000 units CAPS Take 2,000 Units by mouth daily.   0   No current facility-administered medications for this visit.    PHYSICAL EXAMINATION: ECOG PERFORMANCE STATUS: 1 - Symptomatic but completely ambulatory  Vitals:   04/03/20 0837  BP: (!) 150/98  Pulse: 95  Resp: 18  Temp: 97.6 F (36.4 C)  SpO2: 100%   Filed Weights   04/03/20 0837  Weight: 182 lb 12.8 oz (82.9 kg)    GENERAL:alert, no distress and comfortable  NEURO: alert & oriented x 3 with fluent speech, no focal motor/sensory deficits  LABORATORY DATA:  I have reviewed the data as listed    Component Value Date/Time   NA 142 03/24/2020 0743   K 4.4 03/24/2020 0743   CL 112 (H) 03/24/2020 0743   CO2 19 (L) 03/24/2020 0743   GLUCOSE 142 (H) 03/24/2020 0743   BUN 41 (H) 03/24/2020 0743   CREATININE 2.82 (H) 03/24/2020 0743   CREATININE 1.75 (H) 01/10/2020 1200   CALCIUM 9.3 03/24/2020 0743   PROT 6.7 03/24/2020 0743   ALBUMIN 3.5 03/24/2020 0743   AST 17 03/24/2020 0743   AST 13 (L) 01/10/2020 1200   ALT 14 03/24/2020 0743   ALT 11 01/10/2020 1200   ALKPHOS 67 03/24/2020 0743   BILITOT 0.4 03/24/2020 0743   BILITOT 0.4 01/10/2020 1200   GFRNONAA 18 (L) 03/24/2020 0743   GFRNONAA 29 (L) 01/10/2020 1200   GFRAA 34 (L) 01/10/2020 1200    No results found for: SPEP, UPEP  Lab Results  Component Value Date   WBC 5.8  03/24/2020   NEUTROABS 4.1 03/24/2020   HGB 10.4 (L) 03/24/2020   HCT 32.9 (L) 03/24/2020   MCV 90.9 03/24/2020   PLT 179 03/24/2020      Chemistry      Component Value Date/Time   NA 142 03/24/2020 0743   K 4.4 03/24/2020 0743   CL 112 (H) 03/24/2020 0743   CO2 19 (L) 03/24/2020 0743   BUN 41 (H) 03/24/2020 0743   CREATININE 2.82 (H) 03/24/2020 0743   CREATININE 1.75 (H) 01/10/2020 1200      Component Value Date/Time   CALCIUM  9.3 03/24/2020 0743   ALKPHOS 67 03/24/2020 0743   AST 17 03/24/2020 0743   AST 13 (L) 01/10/2020 1200   ALT 14 03/24/2020 0743   ALT 11 01/10/2020 1200   BILITOT 0.4 03/24/2020 0743   BILITOT 0.4 01/10/2020 1200       RADIOGRAPHIC STUDIES: I have reviewed imaging study with the patient I have personally reviewed the radiological images as listed and agreed with the findings in the report. CT Abdomen Pelvis Wo Contrast  Result Date: 04/02/2020 CLINICAL DATA:  Endometrial cancer diagnosed August 2019. Restaging. Ongoing immunotherapy and oral chemotherapy. EXAM: CT CHEST, ABDOMEN AND PELVIS WITHOUT CONTRAST TECHNIQUE: Multidetector CT imaging of the chest, abdomen and pelvis was performed following the standard protocol without IV contrast. COMPARISON:  11/28/2019 CT chest, abdomen and pelvis. FINDINGS: CT CHEST FINDINGS Cardiovascular: Normal heart size. No significant pericardial effusion/thickening. Left anterior descending and left circumflex coronary atherosclerosis. Right internal jugular Port-A-Cath terminates in the lower third of the SVC. Normal course and caliber of the thoracic aorta. Top-normal caliber main pulmonary artery (3.0 cm diameter), stable. Mediastinum/Nodes: No discrete thyroid nodules. Unremarkable esophagus. Enlarged right axillary 2.0 cm short axis diameter lymph node (series 2/image 12), previously 1.9 cm using similar measurement technique, substantially changed. No additional pathologically enlarged axillary nodes. No pathologically enlarged mediastinal or discrete hilar nodes on these noncontrast images. Lungs/Pleura: No pneumothorax. No pleural effusion. Ground-glass 5 mm left upper lobe (series 6/image 41) and solid 4 mm peripheral right middle lobe (series 6/image 61) pulmonary nodules are stable. No acute consolidative airspace disease, lung masses or new significant pulmonary nodules. Musculoskeletal: No aggressive appearing focal osseous lesions. Stable T12  vertebral hemangioma. Moderate thoracic spondylosis. CT ABDOMEN PELVIS FINDINGS Hepatobiliary: Normal liver size. At least 5 scattered subcentimeter hypodense liver lesions, too small to characterize, unchanged. No new liver lesions. Stable granulomatous anterior liver calcification. Normal gallbladder with no radiopaque cholelithiasis. No biliary ductal dilatation. Pancreas: Normal, with no mass or duct dilation. Spleen: Normal size. No mass. Adrenals/Urinary Tract: Normal adrenals. No renal stones. No hydronephrosis. Simple 3.8 cm posterior lower right renal cyst. No additional contour deforming renal masses. Normal bladder. Stomach/Bowel: Normal non-distended stomach. Normal caliber small bowel with no small bowel wall thickening. Oral contrast transits to the colon. Normal appendix normal large bowel with no diverticulosis, large bowel wall thickening or pericolonic fat stranding. Vascular/Lymphatic: Atherosclerotic nonaneurysmal abdominal aorta. No pathologically enlarged lymph nodes in the abdomen or pelvis. Reproductive: Status post hysterectomy, with no abnormal findings at the vaginal cuff. No adnexal mass. Other: No pneumoperitoneum, ascites or focal fluid collection. Musculoskeletal: No aggressive appearing focal osseous lesions. Moderate lumbar spondylosis. IMPRESSION: 1. Right axillary lymphadenopathy is stable. Tiny bilateral pulmonary nodules are stable. Subcentimeter low-attenuation liver lesions are stable. 2. No new or progressive metastatic disease in the chest, abdomen or pelvis. 3. Aortic Atherosclerosis (ICD10-I70.0). Electronically Signed   By: Janina Mayo.D.  On: 04/02/2020 11:26   CT CHEST WO CONTRAST  Result Date: 04/02/2020 CLINICAL DATA:  Endometrial cancer diagnosed August 2019. Restaging. Ongoing immunotherapy and oral chemotherapy. EXAM: CT CHEST, ABDOMEN AND PELVIS WITHOUT CONTRAST TECHNIQUE: Multidetector CT imaging of the chest, abdomen and pelvis was performed following  the standard protocol without IV contrast. COMPARISON:  11/28/2019 CT chest, abdomen and pelvis. FINDINGS: CT CHEST FINDINGS Cardiovascular: Normal heart size. No significant pericardial effusion/thickening. Left anterior descending and left circumflex coronary atherosclerosis. Right internal jugular Port-A-Cath terminates in the lower third of the SVC. Normal course and caliber of the thoracic aorta. Top-normal caliber main pulmonary artery (3.0 cm diameter), stable. Mediastinum/Nodes: No discrete thyroid nodules. Unremarkable esophagus. Enlarged right axillary 2.0 cm short axis diameter lymph node (series 2/image 12), previously 1.9 cm using similar measurement technique, substantially changed. No additional pathologically enlarged axillary nodes. No pathologically enlarged mediastinal or discrete hilar nodes on these noncontrast images. Lungs/Pleura: No pneumothorax. No pleural effusion. Ground-glass 5 mm left upper lobe (series 6/image 41) and solid 4 mm peripheral right middle lobe (series 6/image 61) pulmonary nodules are stable. No acute consolidative airspace disease, lung masses or new significant pulmonary nodules. Musculoskeletal: No aggressive appearing focal osseous lesions. Stable T12 vertebral hemangioma. Moderate thoracic spondylosis. CT ABDOMEN PELVIS FINDINGS Hepatobiliary: Normal liver size. At least 5 scattered subcentimeter hypodense liver lesions, too small to characterize, unchanged. No new liver lesions. Stable granulomatous anterior liver calcification. Normal gallbladder with no radiopaque cholelithiasis. No biliary ductal dilatation. Pancreas: Normal, with no mass or duct dilation. Spleen: Normal size. No mass. Adrenals/Urinary Tract: Normal adrenals. No renal stones. No hydronephrosis. Simple 3.8 cm posterior lower right renal cyst. No additional contour deforming renal masses. Normal bladder. Stomach/Bowel: Normal non-distended stomach. Normal caliber small bowel with no small bowel wall  thickening. Oral contrast transits to the colon. Normal appendix normal large bowel with no diverticulosis, large bowel wall thickening or pericolonic fat stranding. Vascular/Lymphatic: Atherosclerotic nonaneurysmal abdominal aorta. No pathologically enlarged lymph nodes in the abdomen or pelvis. Reproductive: Status post hysterectomy, with no abnormal findings at the vaginal cuff. No adnexal mass. Other: No pneumoperitoneum, ascites or focal fluid collection. Musculoskeletal: No aggressive appearing focal osseous lesions. Moderate lumbar spondylosis. IMPRESSION: 1. Right axillary lymphadenopathy is stable. Tiny bilateral pulmonary nodules are stable. Subcentimeter low-attenuation liver lesions are stable. 2. No new or progressive metastatic disease in the chest, abdomen or pelvis. 3. Aortic Atherosclerosis (ICD10-I70.0). Electronically Signed   By: Ilona Sorrel M.D.   On: 04/02/2020 11:26   US RENAL  Result Date: 03/16/2020 CLINICAL DATA:  Acute renal failure. EXAM: RENAL / URINARY TRACT ULTRASOUND COMPLETE COMPARISON:  None. FINDINGS: Right Kidney: Renal measurements: 11.9 x 6.4 x 5.3 cm = volume: 210 mL. Increased echogenicity of renal parenchyma is noted suggesting medical renal disease. 3.8 cm simple cyst is noted. No mass or hydronephrosis visualized. Left Kidney: Renal measurements: 11.0 x 5.5 x 5.2 cm = volume: 165 mL. Increased echogenicity of renal parenchyma is noted suggesting medical renal disease. No mass or hydronephrosis visualized. Bladder: Appears normal for degree of bladder distention. Other: None. IMPRESSION: Increased echogenicity of renal parenchyma is noted bilaterally suggesting medical renal disease. No hydronephrosis or renal obstruction is noted. Electronically Signed   By: Marijo Conception M.D.   On: 03/16/2020 11:35

## 2020-04-08 ENCOUNTER — Ambulatory Visit (INDEPENDENT_AMBULATORY_CARE_PROVIDER_SITE_OTHER): Payer: Medicare Other | Admitting: Podiatry

## 2020-04-08 ENCOUNTER — Encounter: Payer: Self-pay | Admitting: Podiatry

## 2020-04-08 ENCOUNTER — Other Ambulatory Visit: Payer: Self-pay

## 2020-04-08 DIAGNOSIS — M79674 Pain in right toe(s): Secondary | ICD-10-CM

## 2020-04-08 DIAGNOSIS — E785 Hyperlipidemia, unspecified: Secondary | ICD-10-CM | POA: Insufficient documentation

## 2020-04-08 DIAGNOSIS — L84 Corns and callosities: Secondary | ICD-10-CM

## 2020-04-08 DIAGNOSIS — I700 Atherosclerosis of aorta: Secondary | ICD-10-CM | POA: Insufficient documentation

## 2020-04-08 DIAGNOSIS — E2839 Other primary ovarian failure: Secondary | ICD-10-CM | POA: Insufficient documentation

## 2020-04-08 DIAGNOSIS — N183 Chronic kidney disease, stage 3 unspecified: Secondary | ICD-10-CM | POA: Diagnosis not present

## 2020-04-08 DIAGNOSIS — E1165 Type 2 diabetes mellitus with hyperglycemia: Secondary | ICD-10-CM | POA: Insufficient documentation

## 2020-04-08 DIAGNOSIS — Z794 Long term (current) use of insulin: Secondary | ICD-10-CM

## 2020-04-08 DIAGNOSIS — M79675 Pain in left toe(s): Secondary | ICD-10-CM | POA: Diagnosis not present

## 2020-04-08 DIAGNOSIS — Q828 Other specified congenital malformations of skin: Secondary | ICD-10-CM

## 2020-04-08 DIAGNOSIS — E0822 Diabetes mellitus due to underlying condition with diabetic chronic kidney disease: Secondary | ICD-10-CM

## 2020-04-08 DIAGNOSIS — B351 Tinea unguium: Secondary | ICD-10-CM | POA: Diagnosis not present

## 2020-04-08 DIAGNOSIS — C799 Secondary malignant neoplasm of unspecified site: Secondary | ICD-10-CM | POA: Insufficient documentation

## 2020-04-08 DIAGNOSIS — N950 Postmenopausal bleeding: Secondary | ICD-10-CM | POA: Insufficient documentation

## 2020-04-08 DIAGNOSIS — E11319 Type 2 diabetes mellitus with unspecified diabetic retinopathy without macular edema: Secondary | ICD-10-CM | POA: Insufficient documentation

## 2020-04-08 DIAGNOSIS — E1121 Type 2 diabetes mellitus with diabetic nephropathy: Secondary | ICD-10-CM | POA: Insufficient documentation

## 2020-04-08 DIAGNOSIS — E559 Vitamin D deficiency, unspecified: Secondary | ICD-10-CM | POA: Insufficient documentation

## 2020-04-08 DIAGNOSIS — Z6832 Body mass index (BMI) 32.0-32.9, adult: Secondary | ICD-10-CM | POA: Insufficient documentation

## 2020-04-08 DIAGNOSIS — N289 Disorder of kidney and ureter, unspecified: Secondary | ICD-10-CM | POA: Insufficient documentation

## 2020-04-08 DIAGNOSIS — E66811 Obesity, class 1: Secondary | ICD-10-CM | POA: Insufficient documentation

## 2020-04-08 DIAGNOSIS — J309 Allergic rhinitis, unspecified: Secondary | ICD-10-CM | POA: Insufficient documentation

## 2020-04-08 NOTE — Progress Notes (Signed)
Subjective:  Patient ID: Krista Johnson, female    DOB: Jul 24, 1950,  MRN: [id number]  69 y.o. female presents with preventative diabetic foot care and corn(s) b/l 5th digits , callus(es) b/l feet and painful mycotic nails.  Pain interferes with ambulation. Aggravating factors include wearing enclosed shoe gear. Painful toenails interfere with ambulation. Aggravating factors include wearing enclosed shoe gear. Pain is relieved with periodic professional debridement. Painful corns and calluses are aggravated when weightbearing with and without shoegear. Pain is relieved with periodic professional debridement..    Patient's blood sugar was 101 mg/dl this morning. States her A1c is good as she has started eating healthier.  PCP: Krista Jordan, MD and last visit was: 03/26/2020.  She states is is taking 10 units of 70/30 insulin after breakfast.   Review of Systems: Negative except as noted in the HPI.  Past Medical History:  Diagnosis Date  . Arthritis    right knee--- last cortisone injection 04/ 2019  . CKD (chronic kidney disease)   . Diabetes mellitus without complication [facility])    endocrinologist-  dr Loanne Drilling  . Endometrial cancer ([place])   . History of colon polyps   . Hyperlipidemia   . Hyperparathyroidism ([place])   . Hypertension    Past Surgical History:  Procedure Laterality Date  . COLONOSCOPY  last one 12-25-2017  . IR IMAGING GUIDED PORT INSERTION  02/20/2018  . ROBOTIC ASSISTED TOTAL HYSTERECTOMY WITH BILATERAL SALPINGO OOPHERECTOMY N/A 01/23/2018   Procedure: XI ROBOTIC ASSISTED TOTAL HYSTERECTOMY WITH BILATERAL SALPINGO OOPHORECTOMY;  Surgeon: Everitt Amber, MD;  Location: [facility];  Service: Gynecology;  Laterality: N/A;  . SENTINEL NODE BIOPSY N/A 01/23/2018   Procedure: SENTINEL NODE BIOPSY;  Surgeon: Everitt Amber, MD;  Location: [facility];  Service: Gynecology;  Laterality: N/A;   Patient Active Problem List   Diagnosis Date Noted  . Abnormal renal function 04/08/2020  .  Allergic rhinitis 04/08/2020  . 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[place]) 04/08/2020  . Body mass index (BMI) 32.0-32.9, adult 04/08/2020  . Decreased estrogen level 04/08/2020  . Diabetic retinopathy ([place]) 04/08/2020  . Hyperlipidemia 04/08/2020  . Postmenopausal bleeding 04/08/2020  . Secondary malignant neoplasm of unspecified site (CODE) ([place]) 04/08/2020  . Type 2 diabetes mellitus with hyperglycemia ([place]) 04/08/2020  . Vitamin D deficiency 04/08/2020  . Diabetic renal disease ([place]) 04/08/2020  . Acute renal failure (ARF) ([place]) 03/13/2020  . Hypotension due to drugs 02/21/2020  . Multiple lung nodules on CT 11/29/2019  . Hyperparathyroidism ([place]) 05/29/2019  . Acquired hypothyroidism 04/05/2019  . Diabetes ([place]) 04/02/2019  . Essential hypertension 02/22/2019  . Lymphadenopathy, axillary 01/07/2019  . Lymphadenopathy, inguinal 01/07/2019  . Dysuria 07/10/2018  . Anemia, chronic disease 05/01/2018  . Pancytopenia, acquired ([place]) 03/19/2018  . Other constipation 03/19/2018  . Chronic kidney disease (CKD), stage III (moderate) ([place]) 02/09/2018  . Goals of care, counseling/discussion 02/09/2018  . Metastasis to lymph nodes ([place]) 02/09/2018  . Endometrial cancer ([place]) 01/23/2018  . Hypercalcemia 12/26/2017  . Primary osteoarthritis of right knee 07/27/2017  . Chronic pain of both knees 09/20/2016    Current Outpatient Medications:  .  amLODipine (NORVASC) 2.5 MG tablet, Take 2.5 mg by mouth daily., Disp: , Rfl:  .  Cholecalciferol (VITAMIN D3) 50 MCG (2000 UT) capsule, 1 capsule, Disp: , Rfl:  .  insulin isophane & regular human (NOVOLIN 70/30 FLEXPEN) (70-30) 100 UNIT/ML KwikPen, Inject 14 Units into the skin daily with breakfast., Disp: 15 mL, Rfl: 3 .  ketoconazole (NIZORAL) 2 %  cream, Apply to web space twice a day for 1 week then daily for 1 month, Disp: 15 g, Rfl: 0 .  lenvatinib 10 mg daily dose (LENVIMA, 10 MG DAILY DOSE,) capsule, Take 1 capsule (10 mg total) by mouth  daily., Disp: 30 capsule, Rfl: 11 .  levothyroxine (SYNTHROID) 88 MCG tablet, Take 1 tablet (88 mcg total) by mouth daily before breakfast., Disp: 30 tablet, Rfl: 3 .  lidocaine-prilocaine (EMLA) cream, Apply to affected area once, Disp: 30 g, Rfl: 3 .  lisinopril-hydrochlorothiazide (ZESTORETIC) 20-25 MG tablet, , Disp: , Rfl:  .  ondansetron (ZOFRAN) 8 MG tablet, Take 1 tablet (8 mg total) by mouth daily., Disp: 30 tablet, Rfl: 1 .  OneTouch Delica Lancets 95A MISC, 1 each by Other route 2 (two) times daily. Use to monitor glucose levels BID; E11.65, Disp: 100 each, Rfl: 2 .  ONETOUCH VERIO test strip, 1 each by Other route 3 (three) times daily. And lancets 3/day, Disp: 300 each, Rfl: 3 .  RELION PEN NEEDLES 32G X 4 MM MISC, USE AS DIRECTED, Disp: 50 each, Rfl: 0 .  simvastatin (ZOCOR) 10 MG tablet, Take 10 mg by mouth at bedtime. , Disp: , Rfl:  .  Vitamin D, Ergocalciferol, 2000 units CAPS, Take 2,000 Units by mouth daily. , Disp: , Rfl: 0 Allergies  Allergen Reactions  . Sulfa Antibiotics Nausea Only  . Sulfamethoxazole Nausea Only   Social History   Tobacco Use  Smoking Status Never Smoker  Smokeless Tobacco Never Used    Objective:  There were no vitals filed for this visit. Constitutional Patient is a pleasant 69 y.o. African American female in NAD. AAO x 3.  Vascular Capillary refill time to digits immediate b/l. Palpable pedal pulses b/l LE. Pedal hair sparse. Lower extremity skin temperature gradient within normal limits. No pain with calf compression b/l. No cyanosis or clubbing noted.  Neurologic Normal speech. Pt has subjective symptoms of neuropathy. Protective sensation intact 5/5 intact bilaterally with 10g monofilament b/l. Vibratory sensation intact b/l. Proprioception intact bilaterally.  Dermatologic Pedal skin with normal turgor, texture and tone bilaterally. No open wounds bilaterally. No interdigital macerations bilaterally. Toenails 1-5 b/l elongated, discolored,  dystrophic, thickened, crumbly with subungual debris and tenderness to dorsal palpation. Hyperkeratotic lesion(s) L 5th toe and R 5th toe.  No erythema, no edema, no drainage, no fluctuance. Porokeratotic lesion(s) submet head 4 left foot and submet head 5 right foot. No erythema, no edema, no drainage, no fluctuance.  Orthopedic: Normal muscle strength 5/5 to all lower extremity muscle groups bilaterally. No pain crepitus or joint limitation noted with ROM b/l. Hammertoes noted to the L 5th toe and R 5th toe.   Hemoglobin A1C Latest Ref Rng & Units 01/21/2020 10/30/2019 07/29/2019 05/29/2019  HGBA1C 4.0 - 5.6 % 5.8(A) 6.0(A) 6.2(A) 7.2(A)  Some recent data might be hidden   Assessment:   1. Pain due to onychomycosis of toenails of both feet   2. Corns   3. Porokeratosis   4. Diabetes mellitus due to underlying condition with stage 3 chronic kidney disease, with long-term current use of insulin, unspecified whether stage 3a or 3b CKD ([place])    Plan:  Patient was evaluated and treated and all questions answered.  Onychomycosis with pain -Nails palliatively debridement as below. -Educated on self-care  Procedure: Nail Debridement Rationale: Pain Type of Debridement: manual, sharp debridement. Instrumentation: Nail nipper, rotary burr. Number of Nails: 10  -Examined patient. -Continue diabetic foot care principles. -Patient to continue  soft, supportive shoe gear daily. -Toenails 1-5 b/l were debrided in length and girth with sterile nail nippers and dremel without iatrogenic bleeding.  -Corn(s) L 5th toe and R 5th toe pared utilizing sterile scalpel blade without complication or incident. Total number debrided=2. -Painful porokeratotic lesion(s) submet head 4 left foot and submet head 5 right foot pared and enucleated with sterile scalpel blade without incident. -Patient to report any pedal injuries to medical professional immediately. -Patient/POA to call should there be question/concern  in the interim.  Return in about 3 months (around 07/07/2020) for diabetic foot care.  Marzetta Board, DPM

## 2020-04-22 ENCOUNTER — Other Ambulatory Visit: Payer: Self-pay

## 2020-04-22 ENCOUNTER — Ambulatory Visit (INDEPENDENT_AMBULATORY_CARE_PROVIDER_SITE_OTHER): Payer: Medicare Other | Admitting: Endocrinology

## 2020-04-22 ENCOUNTER — Encounter: Payer: Self-pay | Admitting: Endocrinology

## 2020-04-22 ENCOUNTER — Other Ambulatory Visit: Payer: Self-pay | Admitting: Endocrinology

## 2020-04-22 VITALS — BP 164/88 | HR 102 | Ht 66.0 in | Wt 179.0 lb

## 2020-04-22 DIAGNOSIS — N1831 Chronic kidney disease, stage 3a: Secondary | ICD-10-CM

## 2020-04-22 DIAGNOSIS — Z794 Long term (current) use of insulin: Secondary | ICD-10-CM | POA: Diagnosis not present

## 2020-04-22 DIAGNOSIS — E1122 Type 2 diabetes mellitus with diabetic chronic kidney disease: Secondary | ICD-10-CM | POA: Diagnosis not present

## 2020-04-22 LAB — POCT GLYCOSYLATED HEMOGLOBIN (HGB A1C): Hemoglobin A1C: 5.8 % — AB (ref 4.0–5.6)

## 2020-04-22 NOTE — Progress Notes (Signed)
Subjective:    Patient ID: Krista Johnson, female    DOB: Jun 05, 1950, 70 y.o.   MRN: [id number]  HPI Pt returns for f/u of diabetes mellitus:  DM type: Insulin-requiring type 2.   Dx'ed: [id number] Complications: stage 4 CRI Therapy: insulin since mid-2019 GDM: never (G0) DKA: never Severe hypoglycemia: never Pancreatitis: never Pancreatic imaging: normal on 2019 CT.   Other: she takes qd insulin, after poor results with multiple daily injections; She is finished with her IV chemo; she took NPH, then 70/30, due to the pattern of her cbg's; fructosamine has confirmed A1c.   Interval history:she brings her meter with her cbg's which I have reviewed today.  cbg varies from 63-175. She takes 10 units qam.  Pt also has vit-D def and Hyperparathyroidism (prob a combination of primary and secondary).  She takes vit-D, 4000 units/day.  No recent steroids.   Past Medical History:  Diagnosis Date  . Arthritis    right knee--- last cortisone injection 04/ 2019  . CKD (chronic kidney disease)   . Diabetes mellitus without complication [facility])    endocrinologist-  dr Loanne Drilling  . Endometrial cancer ([place])   . History of colon polyps   . Hyperlipidemia   . Hyperparathyroidism ([place])   . Hypertension     Past Surgical History:  Procedure Laterality Date  . COLONOSCOPY  last one 12-25-2017  . IR IMAGING GUIDED PORT INSERTION  02/20/2018  . ROBOTIC ASSISTED TOTAL HYSTERECTOMY WITH BILATERAL SALPINGO OOPHERECTOMY N/A 01/23/2018   Procedure: XI ROBOTIC ASSISTED TOTAL HYSTERECTOMY WITH BILATERAL SALPINGO OOPHORECTOMY;  Surgeon: Everitt Amber, MD;  Location: [facility];  Service: Gynecology;  Laterality: N/A;  . SENTINEL NODE BIOPSY N/A 01/23/2018   Procedure: SENTINEL NODE BIOPSY;  Surgeon: Everitt Amber, MD;  Location: [facility];  Service: Gynecology;  Laterality: N/A;    Social History   Socioeconomic History  . Marital status: Single    Spouse name: Not on file  . Number of children: 0  . Years of education:  Not on file  . Highest education level: Not on file  Occupational History  . Occupation: retired Education officer, [employer]  Tobacco Use  . Smoking status: Never Smoker  . Smokeless tobacco: Never Used  Vaping Use  . Vaping Use: Never used  Substance and Sexual Activity  . Alcohol use: Not Currently    Alcohol/week: 0.0 standard drinks  . Drug use: Never  . Sexual activity: Not on file  Other Topics Concern  . Not on file  Social History Narrative  . Not on file   Social Determinants of Health   Financial Resource Strain: Not on file  Food Insecurity: Not on file  Transportation Needs: Not on file  Physical Activity: Not on file  Stress: Not on file  Social Connections: Not on file  Intimate Partner Violence: Not on file    Current Outpatient Medications on File Prior to Visit  Medication Sig Dispense Refill  . amLODipine (NORVASC) 2.5 MG tablet Take 2.5 mg by mouth daily.    . Cholecalciferol (VITAMIN D3) 50 MCG (2000 UT) capsule 1 capsule    . insulin isophane & regular human (NOVOLIN 70/30 FLEXPEN) (70-30) 100 UNIT/ML KwikPen Inject 14 Units into the skin daily with breakfast. 15 mL 3  . ketoconazole (NIZORAL) 2 % cream Apply to web space twice a day for 1 week then daily for 1 month 15 g 0  . lenvatinib 10 mg daily dose (LENVIMA, 10 MG DAILY DOSE,) capsule Take 1  capsule (10 mg total) by mouth daily. 30 capsule 11  . levothyroxine (SYNTHROID) 88 MCG tablet Take 1 tablet (88 mcg total) by mouth daily before breakfast. 30 tablet 3  . lidocaine-prilocaine (EMLA) cream Apply to affected area once 30 g 3  . lisinopril-hydrochlorothiazide (ZESTORETIC) 20-25 MG tablet     . ondansetron (ZOFRAN) 8 MG tablet Take 1 tablet (8 mg total) by mouth daily. 30 tablet 1  . OneTouch Delica Lancets 33G MISC 1 each by Other route 2 (two) times daily. Use to monitor glucose levels BID; E11.65 100 each 2  . ONETOUCH VERIO test strip 1 each by Other route 3 (three) times daily. And lancets 3/day 300 each 3   . simvastatin (ZOCOR) 10 MG tablet Take 10 mg by mouth at bedtime.     . Vitamin D, Ergocalciferol, 2000 units CAPS Take 2,000 Units by mouth daily.   0   No current facility-administered medications on file prior to visit.    Allergies  Allergen Reactions  . Sulfa Antibiotics Nausea Only  . Sulfamethoxazole Nausea Only    Family History  Problem Relation Age of Onset  . Diabetes Mother   . Hypertension Mother   . Diabetes Sister   . Hypertension Sister   . Diabetes Maternal Uncle   . Colon cancer Paternal Aunt   . Diabetes Paternal Aunt   . Stomach cancer Neg Hx   . Rectal cancer Neg Hx   . Esophageal cancer Neg Hx   . Colon polyps Neg Hx     BP (!) 164/88   Pulse (!) 102   Ht 5\' 6"  (1.676 m)   Wt 179 lb (81.2 kg)   SpO2 99%   BMI 28.89 kg/m    Review of Systems     Objective:   Physical Exam VITAL SIGNS:  See vs page GENERAL: no distress Pulses: dorsalis pedis intact bilat.   MSK: no deformity of the feet CV: no leg edema Skin:  no ulcer on the feet.  normal color and temp on the feet.  Neuro: sensation is intact to touch on the feet.    Lab Results  Component Value Date   CREATININE 2.18 (H) 04/03/2020   BUN 28 (H) 04/03/2020   NA 142 04/03/2020   K 4.3 04/03/2020   CL 112 (H) 04/03/2020   CO2 23 04/03/2020   Lab Results  Component Value Date   HGBA1C 5.8 (A) 04/22/2020       Assessment & Plan:  Insulin-requiring type 2 DM, with stage 4 CRI: overcontrolled.  We discussed.  She declines to d/c altogether.   HTN: is noted today  Patient Instructions  Your blood pressure is high today.  Please see your primary care provider soon, to have it rechecked.   check your blood sugar 3 times a day.  vary the time of day when you check, between before the 3 meals, and at bedtime.  also check if you have symptoms of your blood sugar being too high or too low.  please keep a record of the readings and bring it to your next appointment here (or you can  bring the meter itself).  You can write it on any piece of paper.  please call 06/20/2020 sooner if your blood sugar goes below 70, or if you have a lot of readings over 200. On this type of insulin schedule, you should eat meals on a regular schedule (especially lunch).  If a meal is missed or significantly delayed, your  blood sugar could go low.   Please reduce the insulin to 5 units with breakfast.   Please come back for a follow-up appointment in 3 months.

## 2020-04-22 NOTE — Patient Instructions (Addendum)
Your blood pressure is high today.  Please see your primary care provider soon, to have it rechecked.   check your blood sugar 3 times a day.  vary the time of day when you check, between before the 3 meals, and at bedtime.  also check if you have symptoms of your blood sugar being too high or too low.  please keep a record of the readings and bring it to your next appointment here (or you can bring the meter itself).  You can write it on any piece of paper.  please call us sooner if your blood sugar goes below 70, or if you have a lot of readings over 200. On this type of insulin schedule, you should eat meals on a regular schedule (especially lunch).  If a meal is missed or significantly delayed, your blood sugar could go low.   Please reduce the insulin to 5 units with breakfast.   Please come back for a follow-up appointment in 3 months.

## 2020-04-24 ENCOUNTER — Inpatient Hospital Stay: Payer: Medicare Other

## 2020-04-24 ENCOUNTER — Other Ambulatory Visit: Payer: Self-pay

## 2020-04-24 ENCOUNTER — Inpatient Hospital Stay: Payer: Medicare Other | Attending: Gynecology

## 2020-04-24 VITALS — BP 148/100 | HR 89 | Temp 98.2°F | Resp 18

## 2020-04-24 DIAGNOSIS — Z923 Personal history of irradiation: Secondary | ICD-10-CM | POA: Diagnosis not present

## 2020-04-24 DIAGNOSIS — Z79899 Other long term (current) drug therapy: Secondary | ICD-10-CM | POA: Insufficient documentation

## 2020-04-24 DIAGNOSIS — E039 Hypothyroidism, unspecified: Secondary | ICD-10-CM | POA: Diagnosis not present

## 2020-04-24 DIAGNOSIS — N183 Chronic kidney disease, stage 3 unspecified: Secondary | ICD-10-CM | POA: Insufficient documentation

## 2020-04-24 DIAGNOSIS — Z9221 Personal history of antineoplastic chemotherapy: Secondary | ICD-10-CM | POA: Insufficient documentation

## 2020-04-24 DIAGNOSIS — D631 Anemia in chronic kidney disease: Secondary | ICD-10-CM | POA: Diagnosis not present

## 2020-04-24 DIAGNOSIS — C541 Malignant neoplasm of endometrium: Secondary | ICD-10-CM | POA: Diagnosis not present

## 2020-04-24 DIAGNOSIS — I700 Atherosclerosis of aorta: Secondary | ICD-10-CM | POA: Insufficient documentation

## 2020-04-24 DIAGNOSIS — Z5112 Encounter for antineoplastic immunotherapy: Secondary | ICD-10-CM | POA: Insufficient documentation

## 2020-04-24 DIAGNOSIS — Z7189 Other specified counseling: Secondary | ICD-10-CM

## 2020-04-24 LAB — CBC WITH DIFFERENTIAL/PLATELET
Abs Immature Granulocytes: 0.02 10*3/uL (ref 0.00–0.07)
Basophils Absolute: 0 10*3/uL (ref 0.0–0.1)
Basophils Relative: 0 %
Eosinophils Absolute: 0 10*3/uL (ref 0.0–0.5)
Eosinophils Relative: 0 %
HCT: 32 % — ABNORMAL LOW (ref 36.0–46.0)
Hemoglobin: 10.3 g/dL — ABNORMAL LOW (ref 12.0–15.0)
Immature Granulocytes: 0 %
Lymphocytes Relative: 25 %
Lymphs Abs: 1.2 10*3/uL (ref 0.7–4.0)
MCH: 29.2 pg (ref 26.0–34.0)
MCHC: 32.2 g/dL (ref 30.0–36.0)
MCV: 90.7 fL (ref 80.0–100.0)
Monocytes Absolute: 0.3 10*3/uL (ref 0.1–1.0)
Monocytes Relative: 5 %
Neutro Abs: 3.3 10*3/uL (ref 1.7–7.7)
Neutrophils Relative %: 70 %
Platelets: 183 10*3/uL (ref 150–400)
RBC: 3.53 MIL/uL — ABNORMAL LOW (ref 3.87–5.11)
RDW: 14.1 % (ref 11.5–15.5)
WBC: 4.9 10*3/uL (ref 4.0–10.5)
nRBC: 0 % (ref 0.0–0.2)

## 2020-04-24 LAB — COMPREHENSIVE METABOLIC PANEL
ALT: 13 U/L (ref 0–44)
AST: 14 U/L — ABNORMAL LOW (ref 15–41)
Albumin: 3.4 g/dL — ABNORMAL LOW (ref 3.5–5.0)
Alkaline Phosphatase: 70 U/L (ref 38–126)
Anion gap: 11 (ref 5–15)
BUN: 28 mg/dL — ABNORMAL HIGH (ref 8–23)
CO2: 19 mmol/L — ABNORMAL LOW (ref 22–32)
Calcium: 9 mg/dL (ref 8.9–10.3)
Chloride: 112 mmol/L — ABNORMAL HIGH (ref 98–111)
Creatinine, Ser: 1.81 mg/dL — ABNORMAL HIGH (ref 0.44–1.00)
GFR, Estimated: 30 mL/min — ABNORMAL LOW (ref >60)
Glucose, Bld: 143 mg/dL — ABNORMAL HIGH (ref 70–99)
Potassium: 3.9 mmol/L (ref 3.5–5.1)
Sodium: 142 mmol/L (ref 135–145)
Total Bilirubin: 0.5 mg/dL (ref 0.3–1.2)
Total Protein: 6.4 g/dL — ABNORMAL LOW (ref 6.5–8.1)

## 2020-04-24 LAB — TSH: TSH: 4.129 u[IU]/mL — ABNORMAL HIGH (ref 0.308–3.960)

## 2020-04-24 MED ORDER — HEPARIN SOD (PORK) LOCK FLUSH 100 UNIT/ML IV SOLN
500.0000 [IU] | Freq: Once | INTRAVENOUS | Status: AC | PRN
  Administered 2020-04-24: 500 [IU]
  Filled 2020-04-24: qty 5

## 2020-04-24 MED ORDER — SODIUM CHLORIDE 0.9 % IV SOLN
Freq: Once | INTRAVENOUS | Status: AC
Start: 2020-04-24 — End: 2020-04-24
  Filled 2020-04-24: qty 250

## 2020-04-24 MED ORDER — PEMBROLIZUMAB CHEMO INJECTION 100 MG/4ML
200.0000 mg | Freq: Once | INTRAVENOUS | Status: AC
  Administered 2020-04-24: 200 mg via INTRAVENOUS
  Filled 2020-04-24: qty 8

## 2020-04-24 MED ORDER — SODIUM CHLORIDE 0.9% FLUSH
10.0000 mL | INTRAVENOUS | Status: DC | PRN
  Administered 2020-04-24: 10 mL
  Filled 2020-04-24: qty 10

## 2020-04-24 NOTE — Patient Instructions (Signed)

## 2020-04-24 NOTE — Progress Notes (Signed)
Per Dr. Gorsuch OK to treat with today's labs  

## 2020-04-24 NOTE — Patient Instructions (Signed)
[facility] Discharge Instructions for Patients Receiving Chemotherapy  Today you received the following chemotherapy agents:  Keytruda.  To help prevent nausea and vomiting after your treatment, we encourage you to take your nausea medication as directed.   If you develop nausea and vomiting that is not controlled by your nausea medication, call the clinic.   BELOW ARE SYMPTOMS THAT SHOULD BE REPORTED IMMEDIATELY:  *FEVER GREATER THAN 100.5 F  *CHILLS WITH OR WITHOUT FEVER  NAUSEA AND VOMITING THAT IS NOT CONTROLLED WITH YOUR NAUSEA MEDICATION  *UNUSUAL SHORTNESS OF BREATH  *UNUSUAL BRUISING OR BLEEDING  TENDERNESS IN MOUTH AND THROAT WITH OR WITHOUT PRESENCE OF ULCERS  *URINARY PROBLEMS  *BOWEL PROBLEMS  UNUSUAL RASH Items with * indicate a potential emergency and should be followed up as soon as possible.  Feel free to call the clinic should you have any questions or concerns. The clinic phone number is [phone].  Please show the CHEMO ALERT CARD at check-in to the Emergency Department and triage nurse.    

## 2020-05-01 NOTE — Telephone Encounter (Signed)
Patient is approved for [place] at  No charge from [facility] 05/01/20-04/17/21  [place] uses [place] Patient [place] Phone [phone] Fax [phone] 05/01/2020 10:46 AM

## 2020-05-12 DIAGNOSIS — I10 Essential (primary) hypertension: Secondary | ICD-10-CM | POA: Diagnosis not present

## 2020-05-15 ENCOUNTER — Inpatient Hospital Stay: Payer: Medicare Other

## 2020-05-15 ENCOUNTER — Inpatient Hospital Stay (HOSPITAL_BASED_OUTPATIENT_CLINIC_OR_DEPARTMENT_OTHER): Payer: Medicare Other | Admitting: Hematology and Oncology

## 2020-05-15 ENCOUNTER — Other Ambulatory Visit: Payer: Self-pay | Admitting: Hematology and Oncology

## 2020-05-15 ENCOUNTER — Other Ambulatory Visit: Payer: Self-pay

## 2020-05-15 ENCOUNTER — Encounter: Payer: Self-pay | Admitting: Hematology and Oncology

## 2020-05-15 VITALS — BP 147/97 | HR 84

## 2020-05-15 DIAGNOSIS — Z7189 Other specified counseling: Secondary | ICD-10-CM

## 2020-05-15 DIAGNOSIS — N183 Chronic kidney disease, stage 3 unspecified: Secondary | ICD-10-CM | POA: Diagnosis not present

## 2020-05-15 DIAGNOSIS — D631 Anemia in chronic kidney disease: Secondary | ICD-10-CM | POA: Diagnosis not present

## 2020-05-15 DIAGNOSIS — E039 Hypothyroidism, unspecified: Secondary | ICD-10-CM

## 2020-05-15 DIAGNOSIS — C541 Malignant neoplasm of endometrium: Secondary | ICD-10-CM

## 2020-05-15 DIAGNOSIS — C773 Secondary and unspecified malignant neoplasm of axilla and upper limb lymph nodes: Secondary | ICD-10-CM

## 2020-05-15 DIAGNOSIS — D638 Anemia in other chronic diseases classified elsewhere: Secondary | ICD-10-CM

## 2020-05-15 DIAGNOSIS — Z923 Personal history of irradiation: Secondary | ICD-10-CM | POA: Diagnosis not present

## 2020-05-15 DIAGNOSIS — Z9221 Personal history of antineoplastic chemotherapy: Secondary | ICD-10-CM | POA: Diagnosis not present

## 2020-05-15 DIAGNOSIS — Z5112 Encounter for antineoplastic immunotherapy: Secondary | ICD-10-CM | POA: Diagnosis not present

## 2020-05-15 LAB — CBC WITH DIFFERENTIAL/PLATELET
Abs Immature Granulocytes: 0.04 10*3/uL (ref 0.00–0.07)
Basophils Absolute: 0 10*3/uL (ref 0.0–0.1)
Basophils Relative: 0 %
Eosinophils Absolute: 0 10*3/uL (ref 0.0–0.5)
Eosinophils Relative: 0 %
HCT: 33.2 % — ABNORMAL LOW (ref 36.0–46.0)
Hemoglobin: 10.8 g/dL — ABNORMAL LOW (ref 12.0–15.0)
Immature Granulocytes: 1 %
Lymphocytes Relative: 27 %
Lymphs Abs: 1.3 10*3/uL (ref 0.7–4.0)
MCH: 29 pg (ref 26.0–34.0)
MCHC: 32.5 g/dL (ref 30.0–36.0)
MCV: 89.2 fL (ref 80.0–100.0)
Monocytes Absolute: 0.3 10*3/uL (ref 0.1–1.0)
Monocytes Relative: 5 %
Neutro Abs: 3.3 10*3/uL (ref 1.7–7.7)
Neutrophils Relative %: 67 %
Platelets: 168 10*3/uL (ref 150–400)
RBC: 3.72 MIL/uL — ABNORMAL LOW (ref 3.87–5.11)
RDW: 13.5 % (ref 11.5–15.5)
WBC: 5 10*3/uL (ref 4.0–10.5)
nRBC: 0 % (ref 0.0–0.2)

## 2020-05-15 LAB — COMPREHENSIVE METABOLIC PANEL
ALT: 12 U/L (ref 0–44)
AST: 16 U/L (ref 15–41)
Albumin: 3.4 g/dL — ABNORMAL LOW (ref 3.5–5.0)
Alkaline Phosphatase: 72 U/L (ref 38–126)
Anion gap: 11 (ref 5–15)
BUN: 28 mg/dL — ABNORMAL HIGH (ref 8–23)
CO2: 21 mmol/L — ABNORMAL LOW (ref 22–32)
Calcium: 8.9 mg/dL (ref 8.9–10.3)
Chloride: 112 mmol/L — ABNORMAL HIGH (ref 98–111)
Creatinine, Ser: 1.79 mg/dL — ABNORMAL HIGH (ref 0.44–1.00)
GFR, Estimated: 30 mL/min — ABNORMAL LOW (ref >60)
Glucose, Bld: 125 mg/dL — ABNORMAL HIGH (ref 70–99)
Potassium: 3.8 mmol/L (ref 3.5–5.1)
Sodium: 144 mmol/L (ref 135–145)
Total Bilirubin: 0.5 mg/dL (ref 0.3–1.2)
Total Protein: 6.4 g/dL — ABNORMAL LOW (ref 6.5–8.1)

## 2020-05-15 LAB — TSH: TSH: 5.946 u[IU]/mL — ABNORMAL HIGH (ref 0.308–3.960)

## 2020-05-15 MED ORDER — SODIUM CHLORIDE 0.9 % IV SOLN
200.0000 mg | Freq: Once | INTRAVENOUS | Status: AC
  Administered 2020-05-15: 200 mg via INTRAVENOUS
  Filled 2020-05-15: qty 8

## 2020-05-15 MED ORDER — ALTEPLASE 2 MG IJ SOLR
INTRAMUSCULAR | Status: AC
  Filled 2020-05-15: qty 2

## 2020-05-15 MED ORDER — SODIUM CHLORIDE 0.9 % IV SOLN
Freq: Once | INTRAVENOUS | Status: AC
  Filled 2020-05-15: qty 250

## 2020-05-15 MED ORDER — ALTEPLASE 2 MG IJ SOLR
2.0000 mg | Freq: Once | INTRAMUSCULAR | Status: AC
  Administered 2020-05-15: 2 mg
  Filled 2020-05-15: qty 2

## 2020-05-15 MED ORDER — HEPARIN SOD (PORK) LOCK FLUSH 100 UNIT/ML IV SOLN
500.0000 [IU] | Freq: Once | INTRAVENOUS | Status: AC | PRN
  Administered 2020-05-15: 500 [IU]
  Filled 2020-05-15: qty 5

## 2020-05-15 MED ORDER — LEVOTHYROXINE SODIUM 100 MCG PO TABS: 100.0000 ug | ORAL_TABLET | Freq: Every day | ORAL | 11 refills | Status: DC

## 2020-05-15 MED ORDER — SODIUM CHLORIDE 0.9% FLUSH
10.0000 mL | Freq: Once | INTRAVENOUS | Status: AC
Start: 2020-05-15 — End: 2020-05-15
  Administered 2020-05-15: 10 mL
  Filled 2020-05-15: qty 10

## 2020-05-15 MED ORDER — SODIUM CHLORIDE 0.9% FLUSH
10.0000 mL | INTRAVENOUS | Status: DC | PRN
  Administered 2020-05-15: 10 mL
  Filled 2020-05-15: qty 10

## 2020-05-15 NOTE — Patient Instructions (Signed)

## 2020-05-15 NOTE — Progress Notes (Signed)
Per Dr. Christiana Pellant ok for treatment today with serum creatine 1.79.

## 2020-05-15 NOTE — Progress Notes (Signed)
[name] OFFICE PROGRESS NOTE  Patient Care Team: Jonathon Jordan, MD as PCP - General (Family Medicine) Awanda Mink Craige Cotta, RN as Registered Nurse (Oncology)  ASSESSMENT & PLAN:  Endometrial cancer [facility]) I have reviewed multiple CT imaging with the patient She has achieved stable disease control So far, she tolerated treatment well except for fluctuation of her blood pressure and recent renal function which I do not believe related to side effects of treatment We will continue treatment indefinitely I plan to repeat imaging study in May 2022  Chronic kidney disease (CKD), stage III (moderate) (HCC) Recent renal function is stable We discussed the importance of aggressive risk factor modification  Acquired hypothyroidism TSH is still elevated I will adjust the dose of her Synthroid  Anemia, chronic disease This is likely multifactorial related to anemia chronic disease It is not likely due to her treatment She is not symptomatic.  Observe only.     No orders of the defined types were placed in this encounter.   All questions were answered. The patient knows to call the clinic with any problems, questions or concerns. The total time spent in the appointment was 20 minutes encounter with patients including review of chart and various tests results, discussions about plan of care and coordination of care plan   [name], MD 05/15/2020 10:00 AM  INTERVAL HISTORY: Please see below for problem oriented charting. 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 Leo Grosser who obtained an endometrial biopsy showing poorly differentiated endometrial carcinoma and negative endocervical curettage   12/12/2017 Imaging   MAMMOGRAM FINDINGS: In the right axilla, a possible mass warrants further evaluation. In the left breast, no findings suspicious for malignancy.  Images were processed with CAD.  IMPRESSION: Further evaluation is suggested for possible mass in the right axilla.    12/24/2017 Imaging   Ct scan chest, abdomen and pelvis 1. Marked thickening of the endometrium (42 mm) compatible with known primary endometrial malignancy. No evidence of extrauterine invasion. 2. No pelvic or retroperitoneal adenopathy. 3. Right middle lobe 4 mm solid pulmonary nodule, for which follow-up chest CT is advised in 3-6 months. 4. Several findings that are equivocal for distant metastatic disease. Vaguely nodular heterogeneous hyperenhancement in the peripheral right liver lobe, which could represent benign transient perfusional phenomena, with underlying liver lesions not entirely excluded. Mildly sclerotic T12 vertebral lesion. Mildly enlarged right axillary lymph node. The best single test to further evaluate these findings would be a PET-CT. Alternative tests include bone scan or thoracic MRI without and with IV contrast for the T12 osseous lesion, MRI abdomen without and with IV contrast for the liver findings, and diagnostic mammographic evaluation for the right axillary node. 5.  Aortic Atherosclerosis (ICD10-I70.0).    01/09/2018 PET scan   1. Moderate hypermetabolism corresponding to enlarging axillary node since 12/22/2017. Highly suspicious for an atypical distribution of metastatic disease. 2. No hypermetabolism to suggest hepatic or T12 osseous metastasis. 3. Hypermetabolic  endometrial primary.   01/23/2018  Initial Diagnosis   Endometrial cancer ([place])   01/23/2018 Pathology Results   1. Lymph node, sentinel, biopsy, left external iliac - NO CARCINOMA IDENTIFIED IN ONE LYMPH NODE (0/1) - SEE COMMENT 2. Lymph nodes, regional resection, right para aortic - NO CARCINOMA IDENTIFIED IN FOUR LYMPH NODES (0/4) - SEE COMMENT 3. Lymph nodes, regional resection, right pelvic - NO CARCINOMA IDENTIFIED IN EIGHT LYMPH NODES (0/8) - SEE COMMENT 4. Cul-de-sac biopsy - METASTATIC CARCINOMA 5. Uterus +/- tubes/ovaries, neoplastic, cervix, bilateral fallopian tubes and ovaries UTERUS: - MIXED SEROUS AND ENDOMETRIOID CARCINOMA - SEROSAL IMPLANTS PRESENT - LYMPHOVASCULAR SPACE INVASION PRESENT - LEIOMYOMATA (1.5 CM; LARGEST) - SEE ONCOLOGY TABLE AND COMMENT BELOW CERVIX: - BENIGN NABOTHIAN CYSTS - NO CARCINOMA IDENTIFIED BILATERAL OVARIES: - METASTATIC CARCINOMA PRESENT ON OVARIAN SURFACE BILATERAL FALLOPIAN TUBES: - INTRALUMINAL CARCINOMA Microscopic Comment 1. -3. Cytokeratin AE1/3 was performed on the sentinel lymph nodes to exclude micrometastasis. There is no evidence of metastatic carcinoma by immunohistochemistry. 5. 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%) Histologic Grade: N/A Myometrial invasion: Estimated less than 50% myometrial invasion (0.3 cm of myometrium involved; 1.4 cm measured thickness) Uterine Serosa Involvement: Present Cervical stromal involvement: Not identified Extent of involvement of other organs: - Fallopian tube (left within the lumen) - Ovary, left (surface involvement) - Cul-de-sac Lymphovascular invasion: Present Regional Lymph Nodes: Examined: 1 Sentinel 12 Non-sentinel 13 Total Lymph nodes with metastasis: 0 Isolated tumor  cells (< 0.2 mm): 0 Micrometastasis: (> 0.2 mm and < 2.0 mm): 0 Macrometastasis: (> 2.0 mm): 0 Extracapsular extension: N/A Tumor block for ancillary studies: 5E, 5B MMR / MSI testing: Pending will be reported separately Pathologic Stage Classification (pTNM, AJCC 8th edition): pT3a, pN0 FIGO Stage: IIIA COMMENT: There is tumor present on the surface of the left ovary and within the lumen of the left fallopian tube. The carcinoma appears to be mixed with the largest component being high grade serous carcinoma. Dr. Lyndon Code reviewed the case and agrees with the above diagnosis.   01/23/2018 Surgery   Surgeon: Donaciano Eva   Operation: Robotic-assisted laparoscopic total hysterectomy with bilateral salpingoophorectomy, SLN injection, mapping and biopsy, right pelvic and para-aortic lymphadenectomy  Operative Findings:  : 10-12cm bulky uterus with frank serosal involvement on posterior cul de sac peritoneum and anterior peritoneum which adhesed the bladder to the anterior uterus. Unilateral mapping on left pelvis. No grossly suspicious nodes. Normal omentum and diaphragms.    02/01/2018 Pathology Results   Lymph node, needle/core biopsy, right axilla - METASTATIC CARCINOMA - SEE COMMENT Microscopic Comment The neoplastic cells are positive for cytokeratin 7 and Pax-8 but negative for cytokeratin 5/6, cytokeratin 20, Gata-3, p63, p53 and GCDFP. Overall, the immunoprofile is consistent with metastasis from the patient's known gynecologic carcinoma.   02/01/2018 Procedure   Ultrasound-guided core biopsies of a suspicious right axillary lymph node.   02/21/2018 - 06/13/2018 Chemotherapy   The patient had carboplatin & Taxol x 6   03/26/2018 - 04/30/2018 Radiation Therapy   Radiation treatment dates:   03/26/18, 04/02/18, 04/19/18, 04/23/18 04/30/18  Site/dose: proximal Vagina, 6 Gy in 5 fractions for a total dose of 30 Gy    04/26/2018 PET scan   1. Interval resection of the hypermetabolic  endometrial primary. No discernible hypermetabolic pelvic sidewall or peritoneal metastases. 2. Stable hypermetabolic bilateral axillary  lymphadenopathy. The degree of hypermetabolism in these lymph nodes remains highly suspicious for neoplasm. 3. Interval development of low level FDG uptake in 2 stable, small lymph nodes in the left groin region. This may be reactive, but close attention recommended to exclude metastatic involvement. 4. Stable non hypermetabolic low-density liver lesions and the mixed lucent and sclerotic T12 lesion is stable without hypermetabolism today.   07/09/2018 Imaging   Status post hysterectomy.  Mild axillary lymphadenopathy, improved. Nodal metastases not excluded.  Irregular wall thickening involving the anterior bladder, possibly reflecting radiation cystitis versus tumor.  Additional stable findings as above, including a 5 mm right middle lobe nodule and stable sclerosis involving the T12 vertebral body.   10/10/2018 Imaging   1. Stable exam. No findings within the abdomen or pelvis to suggest metastatic disease. 2. Unchanged appearance of sclerosis involving the T12 vertebra. 3.  Aortic Atherosclerosis (ICD10-I70.0).   10/10/2018 Imaging   Ct abdomen and pelvis 1. Stable exam. No findings within the abdomen or pelvis to suggest metastatic disease. 2. Unchanged appearance of sclerosis involving the T12 vertebra. 3.  Aortic Atherosclerosis (ICD10-I70.0).   01/16/2019 PET scan   1. Enlargement and increased activity in the left axillary, right axillary, and left inguinal adenopathy compatible with progressive malignancy. 2. Stable 4 mm right middle lobe subpleural nodule, not appreciably hypermetabolic. 3. Other imaging findings of potential clinical significance: Right kidney lower pole cyst. Aortic Atherosclerosis (ICD10-I70.0). Prominent stool throughout the colon favors constipation. Mild chronic left maxillary sinusitis.     02/01/2019 -   Chemotherapy   The patient had pembrolizumab and Lenvima for chemotherapy treatment.     04/25/2019 Imaging   Ct imaging 1. Interval response to therapy. Decrease in size of bilateral axillary and left inguinal lymph nodes. No new or progressive findings identified. 2. Stable sclerotic appearance of the T12 vertebra. 3. Aortic atherosclerosis. Lad coronary artery calcification noted.   08/30/2019 Imaging   1. Bulky RIGHT hilar lymph node, slightly increased in size from previous imaging. 2. Multiple foci of hyperenhancement in the liver. 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 Consider MRI liver on follow-up to establish a baseline for number of lesions as these will appear variable on CT follow-up based on phase of contrast acquisition in the future. 3. Stable 5 mm nodule adjacent to the fissure, minor fissure in the RIGHT middle lobe.     11/28/2019 Imaging   1. Stable exam. No new or progressive findings. 2. Stable enlarged right axillary lymph node. 3. Stable tiny bilateral pulmonary nodules. Continued attention on follow-up recommended. 4. Scattered hypodensities in the liver parenchyma correspond to the hypervascular lesions seen on the previous study performed with intravenous contrast material. 5. Right renal cyst. 6. Aortic Atherosclerosis (ICD10-I70.0).     03/16/2020 Imaging   Increased echogenicity of renal parenchyma is noted bilaterally suggesting medical renal disease. No hydronephrosis or renal obstruction is noted   04/02/2020 Imaging   1. Right axillary lymphadenopathy is stable. Tiny bilateral pulmonary nodules are stable. Subcentimeter low-attenuation liver lesions are stable. 2. No new or progressive metastatic disease in the chest, abdomen or pelvis. 3. Aortic  Atherosclerosis (ICD10-I70.0).     REVIEW OF SYSTEMS:   Constitutional: Denies fevers, chills or abnormal weight loss Eyes: Denies blurriness of vision Ears, nose, mouth, throat,  and face: Denies mucositis or sore throat Respiratory: Denies cough, dyspnea or wheezes Cardiovascular: Denies palpitation, chest discomfort or lower extremity swelling Gastrointestinal:  Denies nausea, heartburn or change in bowel habits Skin: Denies abnormal skin rashes Lymphatics: Denies new lymphadenopathy or easy bruising Neurological:Denies numbness, tingling or new weaknesses Behavioral/Psych: Mood is stable, no new changes  All other systems were reviewed with the patient and are negative.  I have reviewed the past medical history, past surgical history, social history and family history with the patient and they are unchanged from previous note.  ALLERGIES:  is allergic to sulfa antibiotics and sulfamethoxazole.  MEDICATIONS:  Current Outpatient Medications  Medication Sig Dispense Refill  . amLODipine (NORVASC) 10 MG tablet Take 10 mg by mouth daily.    . Cholecalciferol (VITAMIN D3) 50 MCG (2000 UT) capsule 1 capsule    . insulin isophane & regular human (NOVOLIN 70/30 FLEXPEN) (70-30) 100 UNIT/ML KwikPen Inject 14 Units into the skin daily with breakfast. 15 mL 3  . ketoconazole (NIZORAL) 2 % cream Apply to web space twice a day for 1 week then daily for 1 month 15 g 0  . lenvatinib 10 mg daily dose (LENVIMA, 10 MG DAILY DOSE,) capsule Take 1 capsule (10 mg total) by mouth daily. 30 capsule 11  . levothyroxine (SYNTHROID) 100 MCG tablet Take 1 tablet (100 mcg total) by mouth daily before breakfast. 30 tablet 11  . lidocaine-prilocaine (EMLA) cream Apply to affected area once 30 g 3  . lisinopril-hydrochlorothiazide (ZESTORETIC) 20-25 MG tablet     . ondansetron (ZOFRAN) 8 MG tablet Take 1 tablet (8 mg total) by mouth daily. 30 tablet 1  . OneTouch Delica Lancets 16X MISC 1 each by Other route 2  (two) times daily. Use to monitor glucose levels BID; E11.65 100 each 2  . ONETOUCH VERIO test strip 1 each by Other route 3 (three) times daily. And lancets 3/day 300 each 3  . RELION PEN NEEDLES 32G X 4 MM MISC USE 1 PEN PER DAY 50 each 5  . simvastatin (ZOCOR) 10 MG tablet Take 10 mg by mouth at bedtime.     . Vitamin D, Ergocalciferol, 2000 units CAPS Take 2,000 Units by mouth daily.   0   No current facility-administered medications for this visit.    PHYSICAL EXAMINATION: ECOG PERFORMANCE STATUS: 1 - Symptomatic but completely ambulatory  Vitals:   05/15/20 0811  BP: (!) 158/85  Pulse: 100  Resp: 18  Temp: 97.7 F (36.5 C)  SpO2: 100%   Filed Weights   05/15/20 0811  Weight: 179 lb 6.4 oz (81.4 kg)    GENERAL:alert, no distress and comfortable SKIN: skin color, texture, turgor are normal, no rashes or significant lesions EYES: normal, Conjunctiva are pink and non-injected, sclera clear OROPHARYNX:no exudate, no erythema and lips, buccal mucosa, and tongue normal  NECK: supple, thyroid normal size, non-tender, without nodularity LYMPH:  no palpable lymphadenopathy in the cervical, axillary or inguinal LUNGS: clear to auscultation and percussion with normal breathing effort HEART: regular rate & rhythm and no murmurs and no lower extremity edema ABDOMEN:abdomen soft, non-tender and normal bowel sounds Musculoskeletal:no cyanosis of digits and no clubbing  NEURO: alert & oriented x 3 with fluent speech, no focal motor/sensory deficits  LABORATORY DATA:  I have reviewed the data as listed    Component Value Date/Time   NA 144 05/15/2020 0755   K 3.8 05/15/2020 0755   CL 112 (H) 05/15/2020 0755   CO2 21 (L)  05/15/2020 0755   GLUCOSE 125 (H) 05/15/2020 0755   BUN 28 (H) 05/15/2020 0755   CREATININE 1.79 (H) 05/15/2020 0755   CREATININE 1.75 (H) 01/10/2020 1200   CALCIUM 8.9 05/15/2020 0755   PROT 6.4 (L) 05/15/2020 0755   ALBUMIN 3.4 (L) 05/15/2020 0755   AST 16  05/15/2020 0755   AST 13 (L) 01/10/2020 1200   ALT 12 05/15/2020 0755   ALT 11 01/10/2020 1200   ALKPHOS 72 05/15/2020 0755   BILITOT 0.5 05/15/2020 0755   BILITOT 0.4 01/10/2020 1200   GFRNONAA 30 (L) 05/15/2020 0755   GFRNONAA 29 (L) 01/10/2020 1200   GFRAA 34 (L) 01/10/2020 1200    No results found for: SPEP, UPEP  Lab Results  Component Value Date   WBC 5.0 05/15/2020   NEUTROABS 3.3 05/15/2020   HGB 10.8 (L) 05/15/2020   HCT 33.2 (L) 05/15/2020   MCV 89.2 05/15/2020   PLT 168 05/15/2020      Chemistry      Component Value Date/Time   NA 144 05/15/2020 0755   K 3.8 05/15/2020 0755   CL 112 (H) 05/15/2020 0755   CO2 21 (L) 05/15/2020 0755   BUN 28 (H) 05/15/2020 0755   CREATININE 1.79 (H) 05/15/2020 0755   CREATININE 1.75 (H) 01/10/2020 1200      Component Value Date/Time   CALCIUM 8.9 05/15/2020 0755   ALKPHOS 72 05/15/2020 0755   AST 16 05/15/2020 0755   AST 13 (L) 01/10/2020 1200   ALT 12 05/15/2020 0755   ALT 11 01/10/2020 1200   BILITOT 0.5 05/15/2020 0755   BILITOT 0.4 01/10/2020 1200

## 2020-05-15 NOTE — Assessment & Plan Note (Signed)
I have reviewed multiple CT imaging with the patient She has achieved stable disease control So far, she tolerated treatment well except for fluctuation of her blood pressure and recent renal function which I do not believe related to side effects of treatment We will continue treatment indefinitely I plan to repeat imaging study in May 2022 

## 2020-05-15 NOTE — Assessment & Plan Note (Signed)
This is likely multifactorial related to anemia chronic disease It is not likely due to her treatment She is not symptomatic.  Observe only.   

## 2020-05-15 NOTE — Assessment & Plan Note (Signed)
Recent renal function is stable We discussed the importance of aggressive risk factor modification 

## 2020-05-15 NOTE — Progress Notes (Signed)
Per Dr. Alvy Bimler, Instructed pt. to take Synthroid 100 mcg daily, new prescription sent to [facility].

## 2020-05-15 NOTE — Patient Instructions (Signed)
[facility] °Discharge Instructions for Patients Receiving Chemotherapy ° °Today you received the following immunotherapy agent: Pembrolizumab (Keytruda) ° °To help prevent nausea and vomiting after your treatment, we encourage you to take your nausea medication as directed by your MD. °  °If you develop nausea and vomiting that is not controlled by your nausea medication, call the clinic.  ° °BELOW ARE SYMPTOMS THAT SHOULD BE REPORTED IMMEDIATELY: °· *FEVER GREATER THAN 100.5 F °· *CHILLS WITH OR WITHOUT FEVER °· NAUSEA AND VOMITING THAT IS NOT CONTROLLED WITH YOUR NAUSEA MEDICATION °· *UNUSUAL SHORTNESS OF BREATH °· *UNUSUAL BRUISING OR BLEEDING °· TENDERNESS IN MOUTH AND THROAT WITH OR WITHOUT PRESENCE OF ULCERS °· *URINARY PROBLEMS °· *BOWEL PROBLEMS °· UNUSUAL RASH °Items with * indicate a potential emergency and should be followed up as soon as possible. ° °Feel free to call the clinic should you have any questions or concerns. The clinic phone number is [phone]. ° °Please show the CHEMO ALERT CARD at check-in to the Emergency Department and triage nurse. ° ° °

## 2020-05-15 NOTE — Assessment & Plan Note (Signed)
TSH is still elevated I will adjust the dose of her Synthroid

## 2020-05-18 ENCOUNTER — Telehealth: Payer: Self-pay | Admitting: Endocrinology

## 2020-05-18 ENCOUNTER — Telehealth: Payer: Self-pay

## 2020-05-18 NOTE — Telephone Encounter (Signed)
Patient called to advise that since she has stopped taking insulin as advised at her last visit.  Her blood sugars range is 100-116.  Call back number for clarification if needed is [phone]

## 2020-05-18 NOTE — Telephone Encounter (Signed)
D/c insulin.  I'll see you next time.  Call if cbg's are over 200

## 2020-05-18 NOTE — Telephone Encounter (Signed)
Pt stated--since discontinue taking the Novolin for the past 2 weeks sugar ranges 100-116 and symptoms. Please advise.

## 2020-05-18 NOTE — Telephone Encounter (Signed)
Spoke with pt to let her know that Dr. Loanne Drilling wanted her to D/C her insulin and to contact us at the office if it becomes over 200.  Krista Johnson

## 2020-06-03 ENCOUNTER — Other Ambulatory Visit: Payer: Self-pay | Admitting: Hematology and Oncology

## 2020-06-03 DIAGNOSIS — C541 Malignant neoplasm of endometrium: Secondary | ICD-10-CM

## 2020-06-05 ENCOUNTER — Other Ambulatory Visit: Payer: Self-pay

## 2020-06-05 ENCOUNTER — Inpatient Hospital Stay: Payer: Medicare Other

## 2020-06-05 ENCOUNTER — Other Ambulatory Visit: Payer: Self-pay | Admitting: Hematology and Oncology

## 2020-06-05 ENCOUNTER — Inpatient Hospital Stay: Payer: Medicare Other | Attending: Gynecology

## 2020-06-05 VITALS — BP 150/98 | HR 89 | Temp 97.9°F | Resp 18 | Wt 177.0 lb

## 2020-06-05 DIAGNOSIS — E039 Hypothyroidism, unspecified: Secondary | ICD-10-CM | POA: Diagnosis not present

## 2020-06-05 DIAGNOSIS — C541 Malignant neoplasm of endometrium: Secondary | ICD-10-CM

## 2020-06-05 DIAGNOSIS — Z7189 Other specified counseling: Secondary | ICD-10-CM

## 2020-06-05 DIAGNOSIS — Z79899 Other long term (current) drug therapy: Secondary | ICD-10-CM | POA: Diagnosis not present

## 2020-06-05 DIAGNOSIS — C773 Secondary and unspecified malignant neoplasm of axilla and upper limb lymph nodes: Secondary | ICD-10-CM

## 2020-06-05 DIAGNOSIS — Z5112 Encounter for antineoplastic immunotherapy: Secondary | ICD-10-CM | POA: Insufficient documentation

## 2020-06-05 LAB — COMPREHENSIVE METABOLIC PANEL
ALT: 12 U/L (ref 0–44)
AST: 14 U/L — ABNORMAL LOW (ref 15–41)
Albumin: 3.5 g/dL (ref 3.5–5.0)
Alkaline Phosphatase: 70 U/L (ref 38–126)
Anion gap: 12 (ref 5–15)
BUN: 19 mg/dL (ref 8–23)
CO2: 20 mmol/L — ABNORMAL LOW (ref 22–32)
Calcium: 8.1 mg/dL — ABNORMAL LOW (ref 8.9–10.3)
Chloride: 112 mmol/L — ABNORMAL HIGH (ref 98–111)
Creatinine, Ser: 1.64 mg/dL — ABNORMAL HIGH (ref 0.44–1.00)
GFR, Estimated: 34 mL/min — ABNORMAL LOW (ref >60)
Glucose, Bld: 140 mg/dL — ABNORMAL HIGH (ref 70–99)
Potassium: 3.3 mmol/L — ABNORMAL LOW (ref 3.5–5.1)
Sodium: 144 mmol/L (ref 135–145)
Total Bilirubin: 0.5 mg/dL (ref 0.3–1.2)
Total Protein: 6.3 g/dL — ABNORMAL LOW (ref 6.5–8.1)

## 2020-06-05 LAB — CBC WITH DIFFERENTIAL/PLATELET
Abs Immature Granulocytes: 0.04 10*3/uL (ref 0.00–0.07)
Basophils Absolute: 0 10*3/uL (ref 0.0–0.1)
Basophils Relative: 0 %
Eosinophils Absolute: 0 10*3/uL (ref 0.0–0.5)
Eosinophils Relative: 0 %
HCT: 32.4 % — ABNORMAL LOW (ref 36.0–46.0)
Hemoglobin: 10.6 g/dL — ABNORMAL LOW (ref 12.0–15.0)
Immature Granulocytes: 1 %
Lymphocytes Relative: 26 %
Lymphs Abs: 1.1 10*3/uL (ref 0.7–4.0)
MCH: 29 pg (ref 26.0–34.0)
MCHC: 32.7 g/dL (ref 30.0–36.0)
MCV: 88.8 fL (ref 80.0–100.0)
Monocytes Absolute: 0.2 10*3/uL (ref 0.1–1.0)
Monocytes Relative: 5 %
Neutro Abs: 3 10*3/uL (ref 1.7–7.7)
Neutrophils Relative %: 68 %
Platelets: 171 10*3/uL (ref 150–400)
RBC: 3.65 MIL/uL — ABNORMAL LOW (ref 3.87–5.11)
RDW: 13.6 % (ref 11.5–15.5)
WBC: 4.4 10*3/uL (ref 4.0–10.5)
nRBC: 0 % (ref 0.0–0.2)

## 2020-06-05 LAB — TSH: TSH: 9.079 u[IU]/mL — ABNORMAL HIGH (ref 0.308–3.960)

## 2020-06-05 MED ORDER — SODIUM CHLORIDE 0.9 % IV SOLN
200.0000 mg | Freq: Once | INTRAVENOUS | Status: AC
  Administered 2020-06-05: 200 mg via INTRAVENOUS
  Filled 2020-06-05: qty 8

## 2020-06-05 MED ORDER — HEPARIN SOD (PORK) LOCK FLUSH 100 UNIT/ML IV SOLN
500.0000 [IU] | Freq: Once | INTRAVENOUS | Status: AC | PRN
  Administered 2020-06-05: 500 [IU]
  Filled 2020-06-05: qty 5

## 2020-06-05 MED ORDER — SODIUM CHLORIDE 0.9 % IV SOLN
Freq: Once | INTRAVENOUS | Status: AC
  Filled 2020-06-05: qty 250

## 2020-06-05 MED ORDER — LEVOTHYROXINE SODIUM 125 MCG PO TABS: 125.0000 ug | ORAL_TABLET | Freq: Every day | ORAL | 3 refills | Status: DC

## 2020-06-05 MED ORDER — SODIUM CHLORIDE 0.9% FLUSH
10.0000 mL | Freq: Once | INTRAVENOUS | Status: AC
  Administered 2020-06-05: 10 mL
  Filled 2020-06-05: qty 10

## 2020-06-05 MED ORDER — SODIUM CHLORIDE 0.9% FLUSH
10.0000 mL | INTRAVENOUS | Status: DC | PRN
  Administered 2020-06-05: 10 mL
  Filled 2020-06-05: qty 10

## 2020-06-05 NOTE — Patient Instructions (Signed)
[facility] °Discharge Instructions for Patients Receiving Chemotherapy ° °Today you received the following immunotherapy agent: Pembrolizumab (Keytruda) ° °To help prevent nausea and vomiting after your treatment, we encourage you to take your nausea medication as directed by your MD. °  °If you develop nausea and vomiting that is not controlled by your nausea medication, call the clinic.  ° °BELOW ARE SYMPTOMS THAT SHOULD BE REPORTED IMMEDIATELY: °· *FEVER GREATER THAN 100.5 F °· *CHILLS WITH OR WITHOUT FEVER °· NAUSEA AND VOMITING THAT IS NOT CONTROLLED WITH YOUR NAUSEA MEDICATION °· *UNUSUAL SHORTNESS OF BREATH °· *UNUSUAL BRUISING OR BLEEDING °· TENDERNESS IN MOUTH AND THROAT WITH OR WITHOUT PRESENCE OF ULCERS °· *URINARY PROBLEMS °· *BOWEL PROBLEMS °· UNUSUAL RASH °Items with * indicate a potential emergency and should be followed up as soon as possible. ° °Feel free to call the clinic should you have any questions or concerns. The clinic phone number is [phone]. ° °Please show the CHEMO ALERT CARD at check-in to the Emergency Department and triage nurse. ° ° °

## 2020-06-26 ENCOUNTER — Inpatient Hospital Stay: Payer: Medicare Other

## 2020-06-26 ENCOUNTER — Inpatient Hospital Stay (HOSPITAL_BASED_OUTPATIENT_CLINIC_OR_DEPARTMENT_OTHER): Payer: Medicare Other | Admitting: Hematology and Oncology

## 2020-06-26 ENCOUNTER — Other Ambulatory Visit: Payer: Self-pay

## 2020-06-26 ENCOUNTER — Telehealth: Payer: Self-pay | Admitting: Hematology and Oncology

## 2020-06-26 ENCOUNTER — Inpatient Hospital Stay: Payer: Medicare Other | Attending: Gynecology

## 2020-06-26 ENCOUNTER — Encounter: Payer: Self-pay | Admitting: Hematology and Oncology

## 2020-06-26 VITALS — BP 168/90

## 2020-06-26 DIAGNOSIS — I10 Essential (primary) hypertension: Secondary | ICD-10-CM

## 2020-06-26 DIAGNOSIS — Z79899 Other long term (current) drug therapy: Secondary | ICD-10-CM | POA: Insufficient documentation

## 2020-06-26 DIAGNOSIS — C541 Malignant neoplasm of endometrium: Secondary | ICD-10-CM | POA: Diagnosis not present

## 2020-06-26 DIAGNOSIS — N183 Chronic kidney disease, stage 3 unspecified: Secondary | ICD-10-CM | POA: Diagnosis not present

## 2020-06-26 DIAGNOSIS — Z5112 Encounter for antineoplastic immunotherapy: Secondary | ICD-10-CM | POA: Diagnosis not present

## 2020-06-26 DIAGNOSIS — C773 Secondary and unspecified malignant neoplasm of axilla and upper limb lymph nodes: Secondary | ICD-10-CM

## 2020-06-26 DIAGNOSIS — Z7189 Other specified counseling: Secondary | ICD-10-CM

## 2020-06-26 DIAGNOSIS — Z794 Long term (current) use of insulin: Secondary | ICD-10-CM | POA: Insufficient documentation

## 2020-06-26 DIAGNOSIS — Z923 Personal history of irradiation: Secondary | ICD-10-CM | POA: Diagnosis not present

## 2020-06-26 DIAGNOSIS — I700 Atherosclerosis of aorta: Secondary | ICD-10-CM | POA: Diagnosis not present

## 2020-06-26 DIAGNOSIS — Z9221 Personal history of antineoplastic chemotherapy: Secondary | ICD-10-CM | POA: Diagnosis not present

## 2020-06-26 DIAGNOSIS — I129 Hypertensive chronic kidney disease with stage 1 through stage 4 chronic kidney disease, or unspecified chronic kidney disease: Secondary | ICD-10-CM | POA: Diagnosis not present

## 2020-06-26 DIAGNOSIS — E039 Hypothyroidism, unspecified: Secondary | ICD-10-CM | POA: Insufficient documentation

## 2020-06-26 LAB — COMPREHENSIVE METABOLIC PANEL
ALT: 13 U/L (ref 0–44)
AST: 17 U/L (ref 15–41)
Albumin: 3.6 g/dL (ref 3.5–5.0)
Alkaline Phosphatase: 71 U/L (ref 38–126)
Anion gap: 11 (ref 5–15)
BUN: 25 mg/dL — ABNORMAL HIGH (ref 8–23)
CO2: 18 mmol/L — ABNORMAL LOW (ref 22–32)
Calcium: 7.8 mg/dL — ABNORMAL LOW (ref 8.9–10.3)
Chloride: 113 mmol/L — ABNORMAL HIGH (ref 98–111)
Creatinine, Ser: 1.42 mg/dL — ABNORMAL HIGH (ref 0.44–1.00)
GFR, Estimated: 40 mL/min — ABNORMAL LOW (ref >60)
Glucose, Bld: 100 mg/dL — ABNORMAL HIGH (ref 70–99)
Potassium: 3.2 mmol/L — ABNORMAL LOW (ref 3.5–5.1)
Sodium: 142 mmol/L (ref 135–145)
Total Bilirubin: 0.5 mg/dL (ref 0.3–1.2)
Total Protein: 6.5 g/dL (ref 6.5–8.1)

## 2020-06-26 LAB — CBC WITH DIFFERENTIAL/PLATELET
Abs Immature Granulocytes: 0.03 10*3/uL (ref 0.00–0.07)
Basophils Absolute: 0 10*3/uL (ref 0.0–0.1)
Basophils Relative: 0 %
Eosinophils Absolute: 0 10*3/uL (ref 0.0–0.5)
Eosinophils Relative: 0 %
HCT: 33.8 % — ABNORMAL LOW (ref 36.0–46.0)
Hemoglobin: 11.1 g/dL — ABNORMAL LOW (ref 12.0–15.0)
Immature Granulocytes: 1 %
Lymphocytes Relative: 27 %
Lymphs Abs: 1.2 10*3/uL (ref 0.7–4.0)
MCH: 28.8 pg (ref 26.0–34.0)
MCHC: 32.8 g/dL (ref 30.0–36.0)
MCV: 87.8 fL (ref 80.0–100.0)
Monocytes Absolute: 0.2 10*3/uL (ref 0.1–1.0)
Monocytes Relative: 5 %
Neutro Abs: 3.1 10*3/uL (ref 1.7–7.7)
Neutrophils Relative %: 67 %
Platelets: 180 10*3/uL (ref 150–400)
RBC: 3.85 MIL/uL — ABNORMAL LOW (ref 3.87–5.11)
RDW: 13.6 % (ref 11.5–15.5)
WBC: 4.6 10*3/uL (ref 4.0–10.5)
nRBC: 0 % (ref 0.0–0.2)

## 2020-06-26 LAB — TSH: TSH: 8.197 u[IU]/mL — ABNORMAL HIGH (ref 0.308–3.960)

## 2020-06-26 MED ORDER — SODIUM CHLORIDE 0.9% FLUSH
10.0000 mL | INTRAVENOUS | Status: DC | PRN
  Administered 2020-06-26: 10 mL
  Filled 2020-06-26: qty 10

## 2020-06-26 MED ORDER — SODIUM CHLORIDE 0.9 % IV SOLN
Freq: Once | INTRAVENOUS | Status: AC
  Filled 2020-06-26: qty 250

## 2020-06-26 MED ORDER — LIDOCAINE-PRILOCAINE 2.5-2.5 % EX CREA: TOPICAL_CREAM | Freq: Every day | CUTANEOUS | 3 refills | Status: DC | PRN

## 2020-06-26 MED ORDER — SODIUM CHLORIDE 0.9 % IV SOLN
200.0000 mg | Freq: Once | INTRAVENOUS | Status: AC
  Administered 2020-06-26: 200 mg via INTRAVENOUS
  Filled 2020-06-26: qty 8

## 2020-06-26 MED ORDER — HEPARIN SOD (PORK) LOCK FLUSH 100 UNIT/ML IV SOLN
500.0000 [IU] | Freq: Once | INTRAVENOUS | Status: AC | PRN
  Administered 2020-06-26: 500 [IU]
  Filled 2020-06-26: qty 5

## 2020-06-26 MED ORDER — SODIUM CHLORIDE 0.9% FLUSH
10.0000 mL | Freq: Once | INTRAVENOUS | Status: AC
  Administered 2020-06-26: 10 mL
  Filled 2020-06-26: qty 10

## 2020-06-26 NOTE — Assessment & Plan Note (Signed)
She has poorly controlled hypertension On review of her medication list, it appears she is only taking amlodipine I recommend the patient touch base with her primary care doctor for medication adjustment

## 2020-06-26 NOTE — Telephone Encounter (Signed)
Scheduled appointments per 3/11 sch msg. Spoke to patient who is aware of appointments dates and times.

## 2020-06-26 NOTE — Patient Instructions (Signed)
Implanted Port Insertion, Care After This sheet gives you information about how to care for yourself after your procedure. Your health care provider may also give you more specific instructions. If you have problems or questions, contact your health care provider. What can I expect after the procedure? After the procedure, it is common to have:  Discomfort at the port insertion site.  Bruising on the skin over the port. This should improve over 3-4 days. Follow these instructions at home: Port care  After your port is placed, you will get a manufacturer's information card. The card has information about your port. Keep this card with you at all times.  Take care of the port as told by your health care provider. Ask your health care provider if you or a family member can get training for taking care of the port at home. A home health care nurse may also take care of the port.  Make sure to remember what type of port you have. Incision care  Follow instructions from your health care provider about how to take care of your port insertion site. Make sure you: ? Wash your hands with soap and water before and after you change your bandage (dressing). If soap and water are not available, use hand sanitizer. ? Change your dressing as told by your health care provider. ? Leave stitches (sutures), skin glue, or adhesive strips in place. These skin closures may need to stay in place for 2 weeks or longer. If adhesive strip edges start to loosen and curl up, you may trim the loose edges. Do not remove adhesive strips completely unless your health care provider tells you to do that.  Check your port insertion site every day for signs of infection. Check for: ? Redness, swelling, or pain. ? Fluid or blood. ? Warmth. ? Pus or a bad smell.      Activity  Return to your normal activities as told by your health care provider. Ask your health care provider what activities are safe for you.  Do not  lift anything that is heavier than 10 lb (4.5 kg), or the limit that you are told, until your health care provider says that it is safe. General instructions  Take over-the-counter and prescription medicines only as told by your health care provider.  Do not take baths, swim, or use a hot tub until your health care provider approves. Ask your health care provider if you may take showers. You may only be allowed to take sponge baths.  Do not drive for 24 hours if you were given a sedative during your procedure.  Wear a medical alert bracelet in case of an emergency. This will tell any health care providers that you have a port.  Keep all follow-up visits as told by your health care provider. This is important. Contact a health care provider if:  You cannot flush your port with saline as directed, or you cannot draw blood from the port.  You have a fever or chills.  You have redness, swelling, or pain around your port insertion site.  You have fluid or blood coming from your port insertion site.  Your port insertion site feels warm to the touch.  You have pus or a bad smell coming from the port insertion site. Get help right away if:  You have chest pain or shortness of breath.  You have bleeding from your port that you cannot control. Summary  Take care of the port as told by your   health care provider. Keep the manufacturer's information card with you at all times.  Change your dressing as told by your health care provider.  Contact a health care provider if you have a fever or chills or if you have redness, swelling, or pain around your port insertion site.  Keep all follow-up visits as told by your health care provider. This information is not intended to replace advice given to you by your health care provider. Make sure you discuss any questions you have with your health care provider. Document Revised: 10/31/2017 Document Reviewed: 10/31/2017 Elsevier Patient Education   2021 [employer].  

## 2020-06-26 NOTE — Assessment & Plan Note (Signed)
Recent renal function fluctuates up and down We discussed the importance of aggressive risk factor modification

## 2020-06-26 NOTE — Patient Instructions (Signed)
[facility] °Discharge Instructions for Patients Receiving Chemotherapy ° °Today you received the following immunotherapy agent: Pembrolizumab (Keytruda) ° °To help prevent nausea and vomiting after your treatment, we encourage you to take your nausea medication as directed by your MD. °  °If you develop nausea and vomiting that is not controlled by your nausea medication, call the clinic.  ° °BELOW ARE SYMPTOMS THAT SHOULD BE REPORTED IMMEDIATELY: °· *FEVER GREATER THAN 100.5 F °· *CHILLS WITH OR WITHOUT FEVER °· NAUSEA AND VOMITING THAT IS NOT CONTROLLED WITH YOUR NAUSEA MEDICATION °· *UNUSUAL SHORTNESS OF BREATH °· *UNUSUAL BRUISING OR BLEEDING °· TENDERNESS IN MOUTH AND THROAT WITH OR WITHOUT PRESENCE OF ULCERS °· *URINARY PROBLEMS °· *BOWEL PROBLEMS °· UNUSUAL RASH °Items with * indicate a potential emergency and should be followed up as soon as possible. ° °Feel free to call the clinic should you have any questions or concerns. The clinic phone number is [phone]. ° °Please show the CHEMO ALERT CARD at check-in to the Emergency Department and triage nurse. ° ° °

## 2020-06-26 NOTE — Assessment & Plan Note (Addendum)
TSH is intermittently elevated I do not plan to adjust the dose of her Synthroid today

## 2020-06-26 NOTE — Assessment & Plan Note (Signed)
Her last CT scan showed stable disease control So far, she tolerated treatment well except for fluctuation of her blood pressure and recent renal function  We will continue treatment indefinitely I plan to repeat imaging study in May 2022

## 2020-06-26 NOTE — Progress Notes (Signed)
[facility] OFFICE PROGRESS NOTE  Patient Care Team: Jonathon Jordan, MD as PCP - General (Family Medicine) Awanda Mink Craige Cotta, RN as Registered Nurse (Oncology)  ASSESSMENT & PLAN:  Endometrial cancer [facility]) Her last CT scan showed stable disease control So far, she tolerated treatment well except for fluctuation of her blood pressure and recent renal function  We will continue treatment indefinitely I plan to repeat imaging study in May 2022  Essential hypertension She has poorly controlled hypertension On review of her medication list, it appears she is only taking amlodipine I recommend the patient touch base with her primary care doctor for medication adjustment  Chronic kidney disease (CKD), stage III (moderate) (HCC) Recent renal function fluctuates up and down We discussed the importance of aggressive risk factor modification  Acquired hypothyroidism TSH is intermittently elevated I do not plan to adjust the dose of her Synthroid today   No orders of the defined types were placed in this encounter.   All questions were answered. The patient knows to call the clinic with any problems, questions or concerns. The total time spent in the appointment was 20 minutes encounter with patients including review of chart and various tests results, discussions about plan of care and coordination of care plan   [name], MD 06/26/2020 12:23 PM  INTERVAL HISTORY: Please see below for problem oriented charting. 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 Leo Grosser who obtained an endometrial biopsy showing poorly differentiated endometrial carcinoma and negative endocervical curettage   12/12/2017 Imaging   MAMMOGRAM FINDINGS: In the right axilla, a possible mass warrants further evaluation. In the left breast, no findings suspicious for malignancy.  Images were processed with CAD.  IMPRESSION: Further evaluation is suggested for possible mass in the right axilla.    12/24/2017 Imaging   Ct scan chest, abdomen and pelvis 1. Marked thickening of the endometrium (42 mm) compatible with known primary endometrial malignancy. No evidence of extrauterine invasion. 2. No pelvic or retroperitoneal adenopathy. 3. Right middle lobe 4 mm solid pulmonary nodule, for which follow-up chest CT is advised in 3-6 months. 4. Several findings that are equivocal for distant metastatic disease. Vaguely nodular heterogeneous hyperenhancement in the peripheral right liver lobe, which could represent benign transient perfusional phenomena, with underlying liver lesions not entirely excluded. Mildly sclerotic T12 vertebral lesion. Mildly enlarged right axillary lymph node. The best single test to further evaluate these findings would be a PET-CT. Alternative tests include bone scan or thoracic MRI without and with IV contrast for the T12 osseous lesion, MRI abdomen without and with IV contrast for the liver findings, and diagnostic mammographic evaluation for the right axillary node. 5.  Aortic Atherosclerosis (ICD10-I70.0).    01/09/2018 PET scan   1. Moderate hypermetabolism corresponding to enlarging axillary node since 12/22/2017. Highly suspicious for an atypical distribution of metastatic disease. 2. No hypermetabolism to suggest hepatic or  T12 osseous  metastasis. 3. Hypermetabolic endometrial primary.   01/23/2018 Initial Diagnosis   Endometrial cancer ([place])   01/23/2018 Pathology Results   1. Lymph node, sentinel, biopsy, left external iliac - NO CARCINOMA IDENTIFIED IN ONE LYMPH NODE (0/1) - SEE COMMENT 2. Lymph nodes, regional resection, right para aortic - NO CARCINOMA IDENTIFIED IN FOUR LYMPH NODES (0/4) - SEE COMMENT 3. Lymph nodes, regional resection, right pelvic - NO CARCINOMA IDENTIFIED IN EIGHT LYMPH NODES (0/8) - SEE COMMENT 4. Cul-de-sac biopsy - METASTATIC CARCINOMA 5. Uterus +/- tubes/ovaries, neoplastic, cervix, bilateral fallopian tubes and ovaries UTERUS: - MIXED SEROUS AND ENDOMETRIOID CARCINOMA - SEROSAL IMPLANTS PRESENT - LYMPHOVASCULAR SPACE INVASION PRESENT - LEIOMYOMATA (1.5 CM; LARGEST) - SEE ONCOLOGY TABLE AND COMMENT BELOW CERVIX: - BENIGN NABOTHIAN CYSTS - NO CARCINOMA IDENTIFIED BILATERAL OVARIES: - METASTATIC CARCINOMA PRESENT ON OVARIAN SURFACE BILATERAL FALLOPIAN TUBES: - INTRALUMINAL CARCINOMA Microscopic Comment 1. -3. Cytokeratin AE1/3 was performed on the sentinel lymph nodes to exclude micrometastasis. There is no evidence of metastatic carcinoma by immunohistochemistry. 5. 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%) Histologic Grade: N/A Myometrial invasion: Estimated less than 50% myometrial invasion (0.3 cm of myometrium involved; 1.4 cm measured thickness) Uterine Serosa Involvement: Present Cervical stromal involvement: Not identified Extent of involvement of other organs: - Fallopian tube (left within the lumen) - Ovary, left (surface involvement) - Cul-de-sac Lymphovascular invasion: Present Regional Lymph Nodes: Examined: 1 Sentinel 12  Non-sentinel 13 Total Lymph nodes with metastasis: 0 Isolated tumor cells (< 0.2 mm): 0 Micrometastasis: (> 0.2 mm and < 2.0 mm): 0 Macrometastasis: (> 2.0 mm): 0 Extracapsular extension: N/A Tumor block for ancillary studies: 5E, 5B MMR / MSI testing: Pending will be reported separately Pathologic Stage Classification (pTNM, AJCC 8th edition): pT3a, pN0 FIGO Stage: IIIA COMMENT: There is tumor present on the surface of the left ovary and within the lumen of the left fallopian tube. The carcinoma appears to be mixed with the largest component being high grade serous carcinoma. Dr. Lyndon Code reviewed the case and agrees with the above diagnosis.   01/23/2018 Surgery   Surgeon: Donaciano Eva   Operation: Robotic-assisted laparoscopic total hysterectomy with bilateral salpingoophorectomy, SLN injection, mapping and biopsy, right pelvic and para-aortic lymphadenectomy  Operative Findings:  : 10-12cm bulky uterus with frank serosal involvement on posterior cul de sac peritoneum and anterior peritoneum which adhesed the bladder to the anterior uterus. Unilateral mapping on left pelvis. No grossly suspicious nodes. Normal omentum and diaphragms.    02/01/2018 Pathology Results   Lymph node, needle/core biopsy, right axilla - METASTATIC CARCINOMA - SEE COMMENT Microscopic Comment The neoplastic cells are positive for cytokeratin 7 and Pax-8 but negative for cytokeratin 5/6, cytokeratin 20, Gata-3, p63, p53 and GCDFP. Overall, the immunoprofile is consistent with metastasis from the patient's known gynecologic carcinoma.   02/01/2018 Procedure   Ultrasound-guided core biopsies of a suspicious right axillary lymph node.   02/21/2018 - 06/13/2018 Chemotherapy   The patient had carboplatin & Taxol x 6   03/26/2018 - 04/30/2018 Radiation Therapy   Radiation treatment dates:   03/26/18, 04/02/18, 04/19/18, 04/23/18 04/30/18  Site/dose: proximal Vagina, 6 Gy in 5 fractions for a total dose of 30  Gy    04/26/2018 PET scan   1. Interval resection of the hypermetabolic endometrial primary. No discernible hypermetabolic pelvic sidewall or peritoneal metastases.  2. Stable hypermetabolic bilateral axillary lymphadenopathy. The degree of hypermetabolism in these lymph nodes remains highly suspicious for neoplasm. 3. Interval development of low level FDG uptake in 2 stable, small lymph nodes in the left groin region. This may be reactive, but close attention recommended to exclude metastatic involvement. 4. Stable non hypermetabolic low-density liver lesions and the mixed lucent and sclerotic T12 lesion is stable without hypermetabolism today.   07/09/2018 Imaging   Status post hysterectomy.  Mild axillary lymphadenopathy, improved. Nodal metastases not excluded.  Irregular wall thickening involving the anterior bladder, possibly reflecting radiation cystitis versus tumor.  Additional stable findings as above, including a 5 mm right middle lobe nodule and stable sclerosis involving the T12 vertebral body.   10/10/2018 Imaging   1. Stable exam. No findings within the abdomen or pelvis to suggest metastatic disease. 2. Unchanged appearance of sclerosis involving the T12 vertebra. 3.  Aortic Atherosclerosis (ICD10-I70.0).   10/10/2018 Imaging   Ct abdomen and pelvis 1. Stable exam. No findings within the abdomen or pelvis to suggest metastatic disease. 2. Unchanged appearance of sclerosis involving the T12 vertebra. 3.  Aortic Atherosclerosis (ICD10-I70.0).   01/16/2019 PET scan   1. Enlargement and increased activity in the left axillary, right axillary, and left inguinal adenopathy compatible with progressive malignancy. 2. Stable 4 mm right middle lobe subpleural nodule, not appreciably hypermetabolic. 3. Other imaging findings of potential clinical significance: Right kidney lower pole cyst. Aortic Atherosclerosis (ICD10-I70.0). Prominent stool throughout the colon favors  constipation. Mild chronic left maxillary sinusitis.     02/01/2019 -  Chemotherapy   The patient had pembrolizumab and Lenvima for chemotherapy treatment.     04/25/2019 Imaging   Ct imaging 1. Interval response to therapy. Decrease in size of bilateral axillary and left inguinal lymph nodes. No new or progressive findings identified. 2. Stable sclerotic appearance of the T12 vertebra. 3. Aortic atherosclerosis. Lad coronary artery calcification noted.   08/30/2019 Imaging   1. Bulky RIGHT hilar lymph node, slightly increased in size from previous imaging. 2. Multiple foci of hyperenhancement in the liver. 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 Consider MRI liver on follow-up to establish a baseline for number of lesions as these will appear variable on CT follow-up based on phase of contrast acquisition in the future. 3. Stable 5 mm nodule adjacent to the fissure, minor fissure in the RIGHT middle lobe.     11/28/2019 Imaging   1. Stable exam. No new or progressive findings. 2. Stable enlarged right axillary lymph node. 3. Stable tiny bilateral pulmonary nodules. Continued attention on follow-up recommended. 4. Scattered hypodensities in the liver parenchyma correspond to the hypervascular lesions seen on the previous study performed with intravenous contrast material. 5. Right renal cyst. 6. Aortic Atherosclerosis (ICD10-I70.0).     03/16/2020 Imaging   Increased echogenicity of renal parenchyma is noted bilaterally suggesting medical renal disease. No hydronephrosis or renal obstruction is noted   04/02/2020 Imaging   1. Right axillary lymphadenopathy is stable. Tiny bilateral pulmonary nodules are stable. Subcentimeter low-attenuation liver lesions are stable. 2. No new or  progressive metastatic disease in the chest, abdomen or pelvis. 3. Aortic Atherosclerosis (ICD10-I70.0).     REVIEW OF SYSTEMS:   Constitutional: Denies fevers, chills or abnormal weight loss Eyes: Denies blurriness of  vision Ears, nose, mouth, throat, and face: Denies mucositis or sore throat Respiratory: Denies cough, dyspnea or wheezes Cardiovascular: Denies palpitation, chest discomfort or lower extremity swelling Gastrointestinal:  Denies nausea, heartburn or change in bowel habits Skin: Denies abnormal skin rashes Lymphatics: Denies new lymphadenopathy or easy bruising Neurological:Denies numbness, tingling or new weaknesses Behavioral/Psych: Mood is stable, no new changes  All other systems were reviewed with the patient and are negative.  I have reviewed the past medical history, past surgical history, social history and family history with the patient and they are unchanged from previous note.  ALLERGIES:  is allergic to sulfa antibiotics and sulfamethoxazole.  MEDICATIONS:  Current Outpatient Medications  Medication Sig Dispense Refill  . amLODipine (NORVASC) 10 MG tablet Take 10 mg by mouth daily.    . Cholecalciferol (VITAMIN D3) 50 MCG (2000 UT) capsule 1 capsule    . insulin isophane & regular human (NOVOLIN 70/30 FLEXPEN) (70-30) 100 UNIT/ML KwikPen Inject 14 Units into the skin daily with breakfast. 15 mL 3  . ketoconazole (NIZORAL) 2 % cream Apply to web space twice a day for 1 week then daily for 1 month 15 g 0  . lenvatinib 10 mg daily dose (LENVIMA, 10 MG DAILY DOSE,) capsule Take 1 capsule (10 mg total) by mouth daily. 30 capsule 11  . levothyroxine (SYNTHROID) 125 MCG tablet Take 1 tablet (125 mcg total) by mouth daily before breakfast. 30 tablet 3  . lidocaine-prilocaine (EMLA) cream Apply topically daily as needed. 30 g 3  . lisinopril-hydrochlorothiazide (ZESTORETIC) 20-25 MG tablet     . ondansetron (ZOFRAN) 8 MG tablet Take 1 tablet (8 mg total) by mouth  daily. 30 tablet 1  . OneTouch Delica Lancets 02R MISC 1 each by Other route 2 (two) times daily. Use to monitor glucose levels BID; E11.65 100 each 2  . ONETOUCH VERIO test strip 1 each by Other route 3 (three) times daily. And lancets 3/day 300 each 3  . RELION PEN NEEDLES 32G X 4 MM MISC USE 1 PEN PER DAY 50 each 5  . simvastatin (ZOCOR) 10 MG tablet Take 10 mg by mouth at bedtime.     . Vitamin D, Ergocalciferol, 2000 units CAPS Take 2,000 Units by mouth daily.   0   No current facility-administered medications for this visit.   Facility-Administered Medications Ordered in Other Visits  Medication Dose Route Frequency Provider Last Rate Last Admin  . sodium chloride flush (NS) 0.9 % injection 10 mL  10 mL Intracatheter PRN Alvy Bimler, Ni, MD   10 mL at 06/26/20 1019    PHYSICAL EXAMINATION: ECOG PERFORMANCE STATUS: 1 - Symptomatic but completely ambulatory  Vitals:   06/26/20 0820  BP: (!) 172/107  Pulse: 89  Resp: 18  Temp: (!) 97.4 F (36.3 C)  SpO2: 100%   Filed Weights   06/26/20 0820  Weight: 176 lb 9.6 oz (80.1 kg)    GENERAL:alert, no distress and comfortable SKIN: skin color, texture, turgor are normal, no rashes or significant lesions EYES: normal, Conjunctiva are pink and non-injected, sclera clear OROPHARYNX:no exudate, no erythema and lips, buccal mucosa, and tongue normal  NECK: supple, thyroid normal size, non-tender, without nodularity LYMPH:  no palpable lymphadenopathy in the cervical, axillary or inguinal LUNGS: clear to auscultation and percussion with normal breathing effort HEART: regular rate & rhythm and no murmurs and no lower extremity edema ABDOMEN:abdomen soft, non-tender and normal bowel sounds Musculoskeletal:no cyanosis of digits and no clubbing  NEURO: alert & oriented  x 3 with fluent speech, no focal motor/sensory deficits  LABORATORY DATA:  I have reviewed the data as listed    Component Value Date/Time   NA 142 06/26/2020 0753   K 3.2  (L) 06/26/2020 0753   CL 113 (H) 06/26/2020 0753   CO2 18 (L) 06/26/2020 0753   GLUCOSE 100 (H) 06/26/2020 0753   BUN 25 (H) 06/26/2020 0753   CREATININE 1.42 (H) 06/26/2020 0753   CREATININE 1.75 (H) 01/10/2020 1200   CALCIUM 7.8 (L) 06/26/2020 0753   PROT 6.5 06/26/2020 0753   ALBUMIN 3.6 06/26/2020 0753   AST 17 06/26/2020 0753   AST 13 (L) 01/10/2020 1200   ALT 13 06/26/2020 0753   ALT 11 01/10/2020 1200   ALKPHOS 71 06/26/2020 0753   BILITOT 0.5 06/26/2020 0753   BILITOT 0.4 01/10/2020 1200   GFRNONAA 40 (L) 06/26/2020 0753   GFRNONAA 29 (L) 01/10/2020 1200   GFRAA 34 (L) 01/10/2020 1200    No results found for: SPEP, UPEP  Lab Results  Component Value Date   WBC 4.6 06/26/2020   NEUTROABS 3.1 06/26/2020   HGB 11.1 (L) 06/26/2020   HCT 33.8 (L) 06/26/2020   MCV 87.8 06/26/2020   PLT 180 06/26/2020      Chemistry      Component Value Date/Time   NA 142 06/26/2020 0753   K 3.2 (L) 06/26/2020 0753   CL 113 (H) 06/26/2020 0753   CO2 18 (L) 06/26/2020 0753   BUN 25 (H) 06/26/2020 0753   CREATININE 1.42 (H) 06/26/2020 0753   CREATININE 1.75 (H) 01/10/2020 1200      Component Value Date/Time   CALCIUM 7.8 (L) 06/26/2020 0753   ALKPHOS 71 06/26/2020 0753   AST 17 06/26/2020 0753   AST 13 (L) 01/10/2020 1200   ALT 13 06/26/2020 0753   ALT 11 01/10/2020 1200   BILITOT 0.5 06/26/2020 0753   BILITOT 0.4 01/10/2020 1200

## 2020-07-08 ENCOUNTER — Encounter: Payer: Self-pay | Admitting: Podiatry

## 2020-07-08 ENCOUNTER — Ambulatory Visit (INDEPENDENT_AMBULATORY_CARE_PROVIDER_SITE_OTHER): Payer: Medicare Other | Admitting: Podiatry

## 2020-07-08 ENCOUNTER — Other Ambulatory Visit: Payer: Self-pay

## 2020-07-08 DIAGNOSIS — B351 Tinea unguium: Secondary | ICD-10-CM | POA: Diagnosis not present

## 2020-07-08 DIAGNOSIS — M79675 Pain in left toe(s): Secondary | ICD-10-CM | POA: Diagnosis not present

## 2020-07-08 DIAGNOSIS — Z794 Long term (current) use of insulin: Secondary | ICD-10-CM

## 2020-07-08 DIAGNOSIS — M79674 Pain in right toe(s): Secondary | ICD-10-CM

## 2020-07-08 DIAGNOSIS — E0822 Diabetes mellitus due to underlying condition with diabetic chronic kidney disease: Secondary | ICD-10-CM | POA: Diagnosis not present

## 2020-07-08 DIAGNOSIS — N183 Chronic kidney disease, stage 3 unspecified: Secondary | ICD-10-CM

## 2020-07-08 DIAGNOSIS — Q828 Other specified congenital malformations of skin: Secondary | ICD-10-CM

## 2020-07-10 ENCOUNTER — Other Ambulatory Visit: Payer: Self-pay | Admitting: Hematology and Oncology

## 2020-07-12 NOTE — Progress Notes (Signed)
  Subjective:  Patient ID: Krista Johnson, female    DOB: [birth year],  MRN: [id number]  70 y.o. female presents with preventative diabetic foot care and corn(s) b/l 5th digits , callus(es) b/l feet and painful mycotic nails.  Pain interferes with ambulation. Aggravating factors include wearing enclosed shoe gear. Painful toenails interfere with ambulation. Aggravating factors include wearing enclosed shoe gear. Pain is relieved with periodic professional debridement. Painful corns and calluses are aggravated when weightbearing with and without shoegear. Pain is relieved with periodic professional debridement..    Patient's blood sugar was 117 mg/dl this morning. She sees Dr. Loanne Drilling for Endocrinology and states her last A1c was 5.8%. She stopped insulin in January.   PCP: Krista Jordan, MD and last visit was: 06/29/2020.   Review of Systems: Negative except as noted in the HPI.   Allergies  Allergen Reactions  . Sulfa Antibiotics Nausea Only  . Sulfamethoxazole Nausea Only    Objective:  There were no vitals filed for this visit. Constitutional Patient is a pleasant 70 y.o. African American female in NAD. AAO x 3.  Vascular Capillary refill time to digits immediate b/l. Palpable pedal pulses b/l LE. Pedal hair sparse. Lower extremity skin temperature gradient within normal limits. No pain with calf compression b/l. No cyanosis or clubbing noted.  Neurologic Normal speech. Pt has subjective symptoms of neuropathy. Protective sensation intact 5/5 intact bilaterally with 10g monofilament b/l. Vibratory sensation intact b/l. Proprioception intact bilaterally.  Dermatologic Pedal skin with normal turgor, texture and tone bilaterally. No open wounds bilaterally. No interdigital macerations bilaterally. Toenails 1-5 b/l elongated, discolored, dystrophic, thickened, crumbly with subungual debris and tenderness to dorsal palpation. Hyperkeratotic lesion(s) L 5th toe and R 5th toe.  No erythema, no  edema, no drainage, no fluctuance. Porokeratotic lesion(s) submet head 4 left foot and submet head 5 right foot. No erythema, no edema, no drainage, no fluctuance.  Orthopedic: Normal muscle strength 5/5 to all lower extremity muscle groups bilaterally. No pain crepitus or joint limitation noted with ROM b/l. Hammertoes noted to the L 5th toe and R 5th toe.   Hemoglobin A1C Latest Ref Rng & Units 04/22/2020 01/21/2020 10/30/2019 07/29/2019  HGBA1C 4.0 - 5.6 % 5.8(A) 5.8(A) 6.0(A) 6.2(A)  Some recent data might be hidden   Assessment:   1. Pain due to onychomycosis of toenails of both feet   2. Porokeratosis   3. 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  Onychomycosis with pain -Nails palliatively debridement as below. -Educated on self-care  Procedure: Nail Debridement Rationale: Pain Type of Debridement: manual, sharp debridement. Instrumentation: Nail nipper, rotary burr. Number of Nails: 10  -Examined patient. -Continue diabetic foot care principles. -Patient to continue soft, supportive shoe gear daily. -Toenails 1-5 b/l were debrided in length and girth with sterile nail nippers and dremel without iatrogenic bleeding.  -Corn(s) L 5th toe and R 5th toe pared utilizing sterile scalpel blade without complication or incident. Total number debrided=2. -Painful porokeratotic lesion(s) submet head 4 left foot and submet head 5 right foot pared and enucleated with sterile scalpel blade without incident. -Patient to report any pedal injuries to medical professional immediately. -Patient/POA to call should there be question/concern in the interim.  Return in about 3 months (around 10/08/2020) for nail trim.  Marzetta Board, DPM

## 2020-07-17 ENCOUNTER — Other Ambulatory Visit: Payer: Self-pay

## 2020-07-17 ENCOUNTER — Inpatient Hospital Stay: Payer: Medicare Other | Attending: Gynecology

## 2020-07-17 ENCOUNTER — Inpatient Hospital Stay: Payer: Medicare Other

## 2020-07-17 VITALS — BP 147/98 | HR 86 | Temp 98.9°F | Resp 18

## 2020-07-17 DIAGNOSIS — E1122 Type 2 diabetes mellitus with diabetic chronic kidney disease: Secondary | ICD-10-CM | POA: Insufficient documentation

## 2020-07-17 DIAGNOSIS — I129 Hypertensive chronic kidney disease with stage 1 through stage 4 chronic kidney disease, or unspecified chronic kidney disease: Secondary | ICD-10-CM | POA: Diagnosis not present

## 2020-07-17 DIAGNOSIS — C541 Malignant neoplasm of endometrium: Secondary | ICD-10-CM

## 2020-07-17 DIAGNOSIS — D631 Anemia in chronic kidney disease: Secondary | ICD-10-CM | POA: Insufficient documentation

## 2020-07-17 DIAGNOSIS — Z7189 Other specified counseling: Secondary | ICD-10-CM

## 2020-07-17 DIAGNOSIS — N183 Chronic kidney disease, stage 3 unspecified: Secondary | ICD-10-CM | POA: Diagnosis not present

## 2020-07-17 DIAGNOSIS — Z79899 Other long term (current) drug therapy: Secondary | ICD-10-CM | POA: Diagnosis not present

## 2020-07-17 DIAGNOSIS — I700 Atherosclerosis of aorta: Secondary | ICD-10-CM | POA: Insufficient documentation

## 2020-07-17 DIAGNOSIS — D638 Anemia in other chronic diseases classified elsewhere: Secondary | ICD-10-CM | POA: Diagnosis not present

## 2020-07-17 DIAGNOSIS — R918 Other nonspecific abnormal finding of lung field: Secondary | ICD-10-CM | POA: Insufficient documentation

## 2020-07-17 DIAGNOSIS — Z5112 Encounter for antineoplastic immunotherapy: Secondary | ICD-10-CM | POA: Diagnosis not present

## 2020-07-17 DIAGNOSIS — Z794 Long term (current) use of insulin: Secondary | ICD-10-CM | POA: Insufficient documentation

## 2020-07-17 DIAGNOSIS — C773 Secondary and unspecified malignant neoplasm of axilla and upper limb lymph nodes: Secondary | ICD-10-CM

## 2020-07-17 DIAGNOSIS — Z9221 Personal history of antineoplastic chemotherapy: Secondary | ICD-10-CM | POA: Diagnosis not present

## 2020-07-17 DIAGNOSIS — E039 Hypothyroidism, unspecified: Secondary | ICD-10-CM

## 2020-07-17 LAB — CBC WITH DIFFERENTIAL/PLATELET
Abs Immature Granulocytes: 0.03 10*3/uL (ref 0.00–0.07)
Basophils Absolute: 0 10*3/uL (ref 0.0–0.1)
Basophils Relative: 0 %
Eosinophils Absolute: 0 10*3/uL (ref 0.0–0.5)
Eosinophils Relative: 1 %
HCT: 35.2 % — ABNORMAL LOW (ref 36.0–46.0)
Hemoglobin: 11.3 g/dL — ABNORMAL LOW (ref 12.0–15.0)
Immature Granulocytes: 1 %
Lymphocytes Relative: 27 %
Lymphs Abs: 1.2 10*3/uL (ref 0.7–4.0)
MCH: 28.5 pg (ref 26.0–34.0)
MCHC: 32.1 g/dL (ref 30.0–36.0)
MCV: 88.7 fL (ref 80.0–100.0)
Monocytes Absolute: 0.3 10*3/uL (ref 0.1–1.0)
Monocytes Relative: 6 %
Neutro Abs: 3 10*3/uL (ref 1.7–7.7)
Neutrophils Relative %: 65 %
Platelets: 180 10*3/uL (ref 150–400)
RBC: 3.97 MIL/uL (ref 3.87–5.11)
RDW: 13.6 % (ref 11.5–15.5)
WBC: 4.6 10*3/uL (ref 4.0–10.5)
nRBC: 0 % (ref 0.0–0.2)

## 2020-07-17 LAB — COMPREHENSIVE METABOLIC PANEL
ALT: 17 U/L (ref 0–44)
AST: 18 U/L (ref 15–41)
Albumin: 3.6 g/dL (ref 3.5–5.0)
Alkaline Phosphatase: 74 U/L (ref 38–126)
Anion gap: 14 (ref 5–15)
BUN: 21 mg/dL (ref 8–23)
CO2: 20 mmol/L — ABNORMAL LOW (ref 22–32)
Calcium: 7.6 mg/dL — ABNORMAL LOW (ref 8.9–10.3)
Chloride: 110 mmol/L (ref 98–111)
Creatinine, Ser: 1.56 mg/dL — ABNORMAL HIGH (ref 0.44–1.00)
GFR, Estimated: 36 mL/min — ABNORMAL LOW (ref >60)
Glucose, Bld: 138 mg/dL — ABNORMAL HIGH (ref 70–99)
Potassium: 3 mmol/L — ABNORMAL LOW (ref 3.5–5.1)
Sodium: 144 mmol/L (ref 135–145)
Total Bilirubin: 0.5 mg/dL (ref 0.3–1.2)
Total Protein: 6.5 g/dL (ref 6.5–8.1)

## 2020-07-17 LAB — TSH: TSH: 7.633 u[IU]/mL — ABNORMAL HIGH (ref 0.308–3.960)

## 2020-07-17 MED ORDER — SODIUM CHLORIDE 0.9% FLUSH
10.0000 mL | Freq: Once | INTRAVENOUS | Status: AC
  Administered 2020-07-17: 10 mL
  Filled 2020-07-17: qty 10

## 2020-07-17 MED ORDER — SODIUM CHLORIDE 0.9 % IV SOLN
Freq: Once | INTRAVENOUS | Status: AC
  Filled 2020-07-17: qty 250

## 2020-07-17 MED ORDER — SODIUM CHLORIDE 0.9 % IV SOLN
200.0000 mg | Freq: Once | INTRAVENOUS | Status: AC
  Administered 2020-07-17: 200 mg via INTRAVENOUS
  Filled 2020-07-17: qty 8

## 2020-07-17 MED ORDER — SODIUM CHLORIDE 0.9% FLUSH
10.0000 mL | INTRAVENOUS | Status: DC | PRN
  Administered 2020-07-17: 10 mL
  Filled 2020-07-17: qty 10

## 2020-07-17 MED ORDER — HEPARIN SOD (PORK) LOCK FLUSH 100 UNIT/ML IV SOLN
500.0000 [IU] | Freq: Once | INTRAVENOUS | Status: AC | PRN
  Administered 2020-07-17: 500 [IU]
  Filled 2020-07-17: qty 5

## 2020-07-17 NOTE — Patient Instructions (Signed)
Implanted Port Insertion, Care After This sheet gives you information about how to care for yourself after your procedure. Your health care provider may also give you more specific instructions. If you have problems or questions, contact your health care provider. What can I expect after the procedure? After the procedure, it is common to have:  Discomfort at the port insertion site.  Bruising on the skin over the port. This should improve over 3-4 days. Follow these instructions at home: Port care  After your port is placed, you will get a manufacturer's information card. The card has information about your port. Keep this card with you at all times.  Take care of the port as told by your health care provider. Ask your health care provider if you or a family member can get training for taking care of the port at home. A home health care nurse may also take care of the port.  Make sure to remember what type of port you have. Incision care  Follow instructions from your health care provider about how to take care of your port insertion site. Make sure you: ? Wash your hands with soap and water before and after you change your bandage (dressing). If soap and water are not available, use hand sanitizer. ? Change your dressing as told by your health care provider. ? Leave stitches (sutures), skin glue, or adhesive strips in place. These skin closures may need to stay in place for 2 weeks or longer. If adhesive strip edges start to loosen and curl up, you may trim the loose edges. Do not remove adhesive strips completely unless your health care provider tells you to do that.  Check your port insertion site every day for signs of infection. Check for: ? Redness, swelling, or pain. ? Fluid or blood. ? Warmth. ? Pus or a bad smell.      Activity  Return to your normal activities as told by your health care provider. Ask your health care provider what activities are safe for you.  Do not  lift anything that is heavier than 10 lb (4.5 kg), or the limit that you are told, until your health care provider says that it is safe. General instructions  Take over-the-counter and prescription medicines only as told by your health care provider.  Do not take baths, swim, or use a hot tub until your health care provider approves. Ask your health care provider if you may take showers. You may only be allowed to take sponge baths.  Do not drive for 24 hours if you were given a sedative during your procedure.  Wear a medical alert bracelet in case of an emergency. This will tell any health care providers that you have a port.  Keep all follow-up visits as told by your health care provider. This is important. Contact a health care provider if:  You cannot flush your port with saline as directed, or you cannot draw blood from the port.  You have a fever or chills.  You have redness, swelling, or pain around your port insertion site.  You have fluid or blood coming from your port insertion site.  Your port insertion site feels warm to the touch.  You have pus or a bad smell coming from the port insertion site. Get help right away if:  You have chest pain or shortness of breath.  You have bleeding from your port that you cannot control. Summary  Take care of the port as told by your   health care provider. Keep the manufacturer's information card with you at all times.  Change your dressing as told by your health care provider.  Contact a health care provider if you have a fever or chills or if you have redness, swelling, or pain around your port insertion site.  Keep all follow-up visits as told by your health care provider. This information is not intended to replace advice given to you by your health care provider. Make sure you discuss any questions you have with your health care provider. Document Revised: 10/31/2017 Document Reviewed: 10/31/2017 Elsevier Patient Education   2021 [employer].  

## 2020-07-17 NOTE — Progress Notes (Signed)
Per Dr. Burr Medico OK to treat today with Creatinine 1.56

## 2020-07-17 NOTE — Patient Instructions (Signed)
[facility] °Discharge Instructions for Patients Receiving Chemotherapy ° °Today you received the following immunotherapy agent: Pembrolizumab (Keytruda) ° °To help prevent nausea and vomiting after your treatment, we encourage you to take your nausea medication as directed by your MD. °  °If you develop nausea and vomiting that is not controlled by your nausea medication, call the clinic.  ° °BELOW ARE SYMPTOMS THAT SHOULD BE REPORTED IMMEDIATELY: °· *FEVER GREATER THAN 100.5 F °· *CHILLS WITH OR WITHOUT FEVER °· NAUSEA AND VOMITING THAT IS NOT CONTROLLED WITH YOUR NAUSEA MEDICATION °· *UNUSUAL SHORTNESS OF BREATH °· *UNUSUAL BRUISING OR BLEEDING °· TENDERNESS IN MOUTH AND THROAT WITH OR WITHOUT PRESENCE OF ULCERS °· *URINARY PROBLEMS °· *BOWEL PROBLEMS °· UNUSUAL RASH °Items with * indicate a potential emergency and should be followed up as soon as possible. ° °Feel free to call the clinic should you have any questions or concerns. The clinic phone number is [phone]. ° °Please show the CHEMO ALERT CARD at check-in to the Emergency Department and triage nurse. ° ° °

## 2020-07-22 ENCOUNTER — Ambulatory Visit (INDEPENDENT_AMBULATORY_CARE_PROVIDER_SITE_OTHER): Payer: Medicare Other | Admitting: Endocrinology

## 2020-07-22 ENCOUNTER — Other Ambulatory Visit: Payer: Self-pay

## 2020-07-22 ENCOUNTER — Telehealth: Payer: Self-pay | Admitting: Endocrinology

## 2020-07-22 VITALS — BP 190/108 | HR 99 | Ht 66.0 in | Wt 175.8 lb

## 2020-07-22 DIAGNOSIS — N1831 Chronic kidney disease, stage 3a: Secondary | ICD-10-CM | POA: Diagnosis not present

## 2020-07-22 DIAGNOSIS — E559 Vitamin D deficiency, unspecified: Secondary | ICD-10-CM | POA: Diagnosis not present

## 2020-07-22 DIAGNOSIS — Z794 Long term (current) use of insulin: Secondary | ICD-10-CM

## 2020-07-22 DIAGNOSIS — E1122 Type 2 diabetes mellitus with diabetic chronic kidney disease: Secondary | ICD-10-CM | POA: Diagnosis not present

## 2020-07-22 LAB — POCT GLYCOSYLATED HEMOGLOBIN (HGB A1C): Hemoglobin A1C: 5.9 % — AB (ref 4.0–5.6)

## 2020-07-22 LAB — VITAMIN D 25 HYDROXY (VIT D DEFICIENCY, FRACTURES): VITD: 28 ng/mL — ABNORMAL LOW (ref 30.00–100.00)

## 2020-07-22 NOTE — Progress Notes (Signed)
Subjective:    Patient ID: Krista Johnson, female    DOB: 1951-02-07, 70 y.o.   MRN: [id number]  HPI Pt returns for f/u of diabetes mellitus:  DM type: 2   Dx'ed: 3532 Complications: stage 4 CRI Therapy: no medication now GDM: never (G0) DKA: never Severe hypoglycemia: never Pancreatitis: never Pancreatic imaging: normal on 2019 CT.   Other: she took insulin 2019-2022; She is finished with her IV chemo; fructosamine has confirmed A1c.   Interval history: she brings her meter with her cbg's which I have reviewed today.  cbg's vary from 87-157.   Pt also has vit-D def and Hyperparathyroidism (prob a combination of primary and secondary).  She takes vit-D, 4000 units/day.  No recent steroids.   Past Medical History:  Diagnosis Date  . Arthritis    right knee--- last cortisone injection 04/ 2019  . CKD (chronic kidney disease)   . Diabetes mellitus without complication [facility])    endocrinologist-  dr Loanne Drilling  . Endometrial cancer ([place])   . History of colon polyps   . Hyperlipidemia   . Hyperparathyroidism ([place])   . Hypertension     Past Surgical History:  Procedure Laterality Date  . COLONOSCOPY  last one 12-25-2017  . IR IMAGING GUIDED PORT INSERTION  02/20/2018  . ROBOTIC ASSISTED TOTAL HYSTERECTOMY WITH BILATERAL SALPINGO OOPHERECTOMY N/A 01/23/2018   Procedure: XI ROBOTIC ASSISTED TOTAL HYSTERECTOMY WITH BILATERAL SALPINGO OOPHORECTOMY;  Surgeon: Everitt Amber, MD;  Location: [facility] ORS;  Service: Gynecology;  Laterality: N/A;  . SENTINEL NODE BIOPSY N/A 01/23/2018   Procedure: SENTINEL NODE BIOPSY;  Surgeon: Everitt Amber, MD;  Location: [facility] ORS;  Service: Gynecology;  Laterality: N/A;    Social History   Socioeconomic History  . Marital status: Single    Spouse name: Not on file  . Number of children: 0  . Years of education: Not on file  . Highest education level: Not on file  Occupational History  . Occupation: retired Education officer, [employer]  Tobacco Use  . Smoking status: Never  Smoker  . Smokeless tobacco: Never Used  Vaping Use  . Vaping Use: Never used  Substance and Sexual Activity  . Alcohol use: Not Currently    Alcohol/week: 0.0 standard drinks  . Drug use: Never  . Sexual activity: Not on file  Other Topics Concern  . Not on file  Social History Narrative  . Not on file   Social Determinants of Health   Financial Resource Strain: Not on file  Food Insecurity: Not on file  Transportation Needs: Not on file  Physical Activity: Not on file  Stress: Not on file  Social Connections: Not on file  Intimate Partner Violence: Not on file    Current Outpatient Medications on File Prior to Visit  Medication Sig Dispense Refill  . amLODipine (NORVASC) 10 MG tablet Take 10 mg by mouth daily.    . insulin isophane & regular human (NOVOLIN 70/30 FLEXPEN) (70-30) 100 UNIT/ML KwikPen Inject 14 Units into the skin daily with breakfast. 15 mL 3  . ketoconazole (NIZORAL) 2 % cream Apply to web space twice a day for 1 week then daily for 1 month 15 g 0  . lenvatinib 10 mg daily dose (LENVIMA, 10 MG DAILY DOSE,) capsule Take 1 capsule (10 mg total) by mouth daily. 30 capsule 11  . levothyroxine (SYNTHROID) 125 MCG tablet Take 1 tablet (125 mcg total) by mouth daily before breakfast. 30 tablet 3  . lidocaine-prilocaine (EMLA) cream Apply topically  daily as needed. 30 g 3  . lisinopril-hydrochlorothiazide (ZESTORETIC) 20-25 MG tablet     . ondansetron (ZOFRAN) 8 MG tablet Take 1 tablet (8 mg total) by mouth daily. 30 tablet 1  . OneTouch Delica Lancets 25K MISC 1 each by Other route 2 (two) times daily. Use to monitor glucose levels BID; E11.65 100 each 2  . ONETOUCH VERIO test strip 1 each by Other route 3 (three) times daily. And lancets 3/day 300 each 3  . RELION PEN NEEDLES 32G X 4 MM MISC USE 1 PEN PER DAY 50 each 5  . simvastatin (ZOCOR) 10 MG tablet Take 10 mg by mouth at bedtime.      No current facility-administered medications on file prior to visit.     Allergies  Allergen Reactions  . Sulfa Antibiotics Nausea Only  . Sulfamethoxazole Nausea Only    Family History  Problem Relation Age of Onset  . Diabetes Mother   . Hypertension Mother   . Diabetes Sister   . Hypertension Sister   . Diabetes Maternal Uncle   . Colon cancer Paternal Aunt   . Diabetes Paternal Aunt   . Stomach cancer Neg Hx   . Rectal cancer Neg Hx   . Esophageal cancer Neg Hx   . Colon polyps Neg Hx     BP (!) 190/108 (BP Location: Right Arm, Patient Position: Sitting, Cuff Size: Normal)   Pulse 99   Ht 5\' 6"  (1.676 m)   Wt 175 lb 12.8 oz (79.7 kg)   SpO2 98%   BMI 28.37 kg/m    Review of Systems     Objective:   Physical Exam VITAL SIGNS:  See vs page GENERAL: no distress Pulses: dorsalis pedis intact bilat.   MSK: no deformity of the feet CV: no leg edema Skin:  no ulcer on the feet.  normal color and temp on the feet. Neuro: sensation is intact to touch on the feet.    Lab Results  Component Value Date   TSH 7.633 (H) 07/17/2020   T4TOTAL 8.3 01/10/2020     Lab Results  Component Value Date   HGBA1C 5.9 (A) 07/22/2020   Lab Results  Component Value Date   PTH 432 (H) 07/22/2020   CALCIUM 7.9 (L) 07/22/2020   CAION 1.28 12/12/2017       Assessment & Plan:  Type 2 DM: well-controlled.  I told pt no medication is needed for this now.  HTN: is noted today Secondary hyperparathyroidism: uncontrolled.  I have sent a prescription to your pharmacy, for high-dose Vit-D.  Patient Instructions  Your blood pressure is high today.  Please see your primary care provider soon, to have it rechecked.   check your blood sugar 3 times a day.  vary the time of day when you check, between before the 3 meals, and at bedtime.  also check if you have symptoms of your blood sugar being too high or too low.  please keep a record of the readings and bring it to your next appointment here (or you can bring the meter itself).  You can write it on any  piece of paper.  please call us sooner if your blood sugar goes below 70, or if you have a lot of readings over 200. Blood tests are requested for you today.  We'll let you know about the results.    Please come back for a follow-up appointment in 6 months.

## 2020-07-22 NOTE — Telephone Encounter (Signed)
Patient requests to be called at ph# [phone] re: Patient states she is interested in getting a Monitor and forgot to discuss with Dr. Loanne Drilling during appointment.

## 2020-07-22 NOTE — Patient Instructions (Addendum)
Your blood pressure is high today.  Please see your primary care provider soon, to have it rechecked.   check your blood sugar 3 times a day.  vary the time of day when you check, between before the 3 meals, and at bedtime.  also check if you have symptoms of your blood sugar being too high or too low.  please keep a record of the readings and bring it to your next appointment here (or you can bring the meter itself).  You can write it on any piece of paper.  please call us sooner if your blood sugar goes below 70, or if you have a lot of readings over 200. Blood tests are requested for you today.  We'll let you know about the results.    Please come back for a follow-up appointment in 6 months.

## 2020-07-23 LAB — PTH, INTACT AND CALCIUM
Calcium: 7.9 mg/dL — ABNORMAL LOW (ref 8.6–10.4)
PTH: 432 pg/mL — ABNORMAL HIGH (ref 16–77)

## 2020-07-23 MED ORDER — ERGOCALCIFEROL 1.25 MG (50000 UT) PO CAPS: 50000.0000 [IU] | ORAL_CAPSULE | ORAL | 1 refills | Status: DC

## 2020-07-23 NOTE — Telephone Encounter (Signed)
Please advise 

## 2020-07-23 NOTE — Telephone Encounter (Signed)
Medicare does not cover continuous glucose monitor in your situation.

## 2020-07-24 ENCOUNTER — Telehealth: Payer: Self-pay

## 2020-07-24 NOTE — Telephone Encounter (Signed)
Patient called and moved her scheduled appointment up to Tuesday October 20, 2020

## 2020-07-24 NOTE — Telephone Encounter (Signed)
-----   Message from Renato Shin, MD sent at 07/23/2020  6:32 PM EDT ----- please contact patient: Vitamin-D and calcium are still low.  I have sent a prescription to your pharmacy, for a prescription Vitamin-D.  This replaces the non-prescription.  Please therefore move up your next appt to 3 mos.

## 2020-07-24 NOTE — Telephone Encounter (Signed)
Pt advised of results and prescription. Pt will call back to schedule appt for 3 months

## 2020-07-25 NOTE — Telephone Encounter (Signed)
Spoke with pt and she was ok with that response.

## 2020-08-06 ENCOUNTER — Inpatient Hospital Stay (HOSPITAL_BASED_OUTPATIENT_CLINIC_OR_DEPARTMENT_OTHER): Payer: Medicare Other | Admitting: Hematology and Oncology

## 2020-08-06 ENCOUNTER — Inpatient Hospital Stay: Payer: Medicare Other

## 2020-08-06 ENCOUNTER — Other Ambulatory Visit: Payer: Self-pay

## 2020-08-06 ENCOUNTER — Encounter: Payer: Self-pay | Admitting: Hematology and Oncology

## 2020-08-06 ENCOUNTER — Other Ambulatory Visit: Payer: Medicare Other

## 2020-08-06 VITALS — BP 162/101 | HR 94 | Temp 97.5°F | Resp 18 | Ht 66.0 in | Wt 174.8 lb

## 2020-08-06 DIAGNOSIS — I10 Essential (primary) hypertension: Secondary | ICD-10-CM | POA: Diagnosis not present

## 2020-08-06 DIAGNOSIS — C541 Malignant neoplasm of endometrium: Secondary | ICD-10-CM

## 2020-08-06 DIAGNOSIS — Z7189 Other specified counseling: Secondary | ICD-10-CM

## 2020-08-06 DIAGNOSIS — R918 Other nonspecific abnormal finding of lung field: Secondary | ICD-10-CM | POA: Diagnosis not present

## 2020-08-06 DIAGNOSIS — E039 Hypothyroidism, unspecified: Secondary | ICD-10-CM

## 2020-08-06 DIAGNOSIS — D638 Anemia in other chronic diseases classified elsewhere: Secondary | ICD-10-CM | POA: Diagnosis not present

## 2020-08-06 DIAGNOSIS — N183 Chronic kidney disease, stage 3 unspecified: Secondary | ICD-10-CM | POA: Diagnosis not present

## 2020-08-06 DIAGNOSIS — Z5112 Encounter for antineoplastic immunotherapy: Secondary | ICD-10-CM | POA: Diagnosis not present

## 2020-08-06 DIAGNOSIS — Z9221 Personal history of antineoplastic chemotherapy: Secondary | ICD-10-CM | POA: Diagnosis not present

## 2020-08-06 DIAGNOSIS — E1122 Type 2 diabetes mellitus with diabetic chronic kidney disease: Secondary | ICD-10-CM | POA: Diagnosis not present

## 2020-08-06 DIAGNOSIS — I129 Hypertensive chronic kidney disease with stage 1 through stage 4 chronic kidney disease, or unspecified chronic kidney disease: Secondary | ICD-10-CM | POA: Diagnosis not present

## 2020-08-06 DIAGNOSIS — C773 Secondary and unspecified malignant neoplasm of axilla and upper limb lymph nodes: Secondary | ICD-10-CM

## 2020-08-06 LAB — CBC WITH DIFFERENTIAL/PLATELET
Abs Immature Granulocytes: 0.03 10*3/uL (ref 0.00–0.07)
Basophils Absolute: 0 10*3/uL (ref 0.0–0.1)
Basophils Relative: 0 %
Eosinophils Absolute: 0 10*3/uL (ref 0.0–0.5)
Eosinophils Relative: 1 %
HCT: 36.6 % (ref 36.0–46.0)
Hemoglobin: 11.9 g/dL — ABNORMAL LOW (ref 12.0–15.0)
Immature Granulocytes: 1 %
Lymphocytes Relative: 25 %
Lymphs Abs: 1.1 10*3/uL (ref 0.7–4.0)
MCH: 28.7 pg (ref 26.0–34.0)
MCHC: 32.5 g/dL (ref 30.0–36.0)
MCV: 88.4 fL (ref 80.0–100.0)
Monocytes Absolute: 0.2 10*3/uL (ref 0.1–1.0)
Monocytes Relative: 5 %
Neutro Abs: 3.1 10*3/uL (ref 1.7–7.7)
Neutrophils Relative %: 68 %
Platelets: 194 10*3/uL (ref 150–400)
RBC: 4.14 MIL/uL (ref 3.87–5.11)
RDW: 14 % (ref 11.5–15.5)
WBC: 4.5 10*3/uL (ref 4.0–10.5)
nRBC: 0 % (ref 0.0–0.2)

## 2020-08-06 LAB — COMPREHENSIVE METABOLIC PANEL
ALT: 12 U/L (ref 0–44)
AST: 17 U/L (ref 15–41)
Albumin: 3.6 g/dL (ref 3.5–5.0)
Alkaline Phosphatase: 70 U/L (ref 38–126)
Anion gap: 13 (ref 5–15)
BUN: 24 mg/dL — ABNORMAL HIGH (ref 8–23)
CO2: 20 mmol/L — ABNORMAL LOW (ref 22–32)
Calcium: 8.3 mg/dL — ABNORMAL LOW (ref 8.9–10.3)
Chloride: 110 mmol/L (ref 98–111)
Creatinine, Ser: 1.57 mg/dL — ABNORMAL HIGH (ref 0.44–1.00)
GFR, Estimated: 35 mL/min — ABNORMAL LOW (ref >60)
Glucose, Bld: 136 mg/dL — ABNORMAL HIGH (ref 70–99)
Potassium: 3.8 mmol/L (ref 3.5–5.1)
Sodium: 143 mmol/L (ref 135–145)
Total Bilirubin: 0.5 mg/dL (ref 0.3–1.2)
Total Protein: 6.5 g/dL (ref 6.5–8.1)

## 2020-08-06 LAB — TSH: TSH: 22.06 u[IU]/mL — ABNORMAL HIGH (ref 0.308–3.960)

## 2020-08-06 MED ORDER — SODIUM CHLORIDE 0.9% FLUSH
10.0000 mL | INTRAVENOUS | Status: DC | PRN
  Administered 2020-08-06: 10 mL
  Filled 2020-08-06: qty 10

## 2020-08-06 MED ORDER — SODIUM CHLORIDE 0.9 % IV SOLN
200.0000 mg | Freq: Once | INTRAVENOUS | Status: AC
  Administered 2020-08-06: 200 mg via INTRAVENOUS
  Filled 2020-08-06: qty 8

## 2020-08-06 MED ORDER — LEVOTHYROXINE SODIUM 150 MCG PO TABS: 150.0000 ug | ORAL_TABLET | Freq: Every day | ORAL | 3 refills | Status: DC

## 2020-08-06 MED ORDER — SODIUM CHLORIDE 0.9 % IV SOLN
Freq: Once | INTRAVENOUS | Status: AC
  Filled 2020-08-06: qty 250

## 2020-08-06 MED ORDER — SODIUM CHLORIDE 0.9% FLUSH
10.0000 mL | Freq: Once | INTRAVENOUS | Status: AC
  Administered 2020-08-06: 10 mL
  Filled 2020-08-06: qty 10

## 2020-08-06 MED ORDER — HEPARIN SOD (PORK) LOCK FLUSH 100 UNIT/ML IV SOLN
500.0000 [IU] | Freq: Once | INTRAVENOUS | Status: AC | PRN
  Administered 2020-08-06: 500 [IU]
  Filled 2020-08-06: qty 5

## 2020-08-06 NOTE — Assessment & Plan Note (Signed)
I will repeat CT imaging of the chest to follow

## 2020-08-06 NOTE — Progress Notes (Signed)
Per Dr. Alvy Bimler via secure chat:  pls tell infusion RN her BP here is usually very high but at home is ok. proceed with elevated BP. Her creatinine will be high, proceed without waiting for CMP  See message from Dr. Alvy Bimler below sent via secure chat:  her TSH is very high. I will send new synthroid dose to her pharmacy. remind her that she needs to take synthroid in the morning 30 mins before eating  Informed patient of TSH lab value and Dr. Calton Dach recommendations. Patient verbalized understanding and stated she will pick up the new prescription today.

## 2020-08-06 NOTE — Assessment & Plan Note (Signed)
TSH is intermittently elevated I plan to increase the dose of her Synthroid and monitor

## 2020-08-06 NOTE — Assessment & Plan Note (Signed)
Recent renal function fluctuates up and down We discussed the importance of aggressive risk factor modification

## 2020-08-06 NOTE — Assessment & Plan Note (Signed)
Overall, her exam is benign I plan to order CT imaging of the chest without contrast to evaluate for response rate She had persistent enlarged lymphadenopathy on her prior imaging We will continue treatment as scheduled

## 2020-08-06 NOTE — Patient Instructions (Signed)

## 2020-08-06 NOTE — Assessment & Plan Note (Signed)
She has poorly controlled hypertension in the office On review of her medication list, it appears she is only taking amlodipine I recommend the patient touch base with her primary care doctor for medication adjustment According to the patient, her documented blood pressure at home is between systolic 1 83-0 30

## 2020-08-06 NOTE — Assessment & Plan Note (Signed)
This is likely multifactorial related to anemia chronic disease It is not likely due to her treatment She is not symptomatic.  Observe only.

## 2020-08-06 NOTE — Progress Notes (Signed)
[facility] OFFICE PROGRESS NOTE  Patient Care Team: Jonathon Jordan, MD as PCP - General (Family Medicine) Awanda Mink Craige Cotta, RN as Registered Nurse (Oncology)  ASSESSMENT & PLAN:  Endometrial cancer ([place]) Overall, her exam is benign I plan to order CT imaging of the chest without contrast to evaluate for response rate She had persistent enlarged lymphadenopathy on her prior imaging We will continue treatment as scheduled  Essential hypertension She has poorly controlled hypertension in the office On review of her medication list, it appears she is only taking amlodipine I recommend the patient touch base with her primary care doctor for medication adjustment According to the patient, her documented blood pressure at home is between systolic 1 29-4 30  Multiple lung nodules on CT I will repeat CT imaging of the chest to follow  Chronic kidney disease (CKD), stage III (moderate) (HCC) Recent renal function fluctuates up and down We discussed the importance of aggressive risk factor modification  Anemia, chronic disease This is likely multifactorial related to anemia chronic disease It is not likely due to her treatment She is not symptomatic.  Observe only.    Acquired hypothyroidism TSH is intermittently elevated I plan to increase the dose of her Synthroid and monitor   Orders Placed This Encounter  Procedures  . CT Chest Wo Contrast    Standing Status:   Future    Standing Expiration Date:   08/06/2021    Order Specific Question:   Preferred imaging location?    Answer:   [facility]  . Total Protein, Urine dipstick    Standing Status:   Standing    Number of Occurrences:   9    Standing Expiration Date:   08/06/2021    All questions were answered. The patient knows to call the clinic with any problems, questions or concerns. The total time spent in the appointment was 30 minutes encounter with patients including review of chart and various tests  results, discussions about plan of care and coordination of care plan   [name], MD 08/06/2020 10:14 AM  INTERVAL HISTORY: Please see below for problem oriented charting. 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 Leo Grosser who obtained an endometrial biopsy showing poorly differentiated endometrial carcinoma and negative endocervical curettage   12/12/2017 Imaging   MAMMOGRAM FINDINGS: In the right axilla, a possible mass warrants further evaluation. In the left breast, no findings suspicious for malignancy.  Images were processed with CAD.  IMPRESSION: Further evaluation is suggested for possible mass in the right axilla.    12/24/2017 Imaging   Ct scan chest, abdomen and pelvis 1. Marked thickening of the endometrium (42 mm) compatible with known primary endometrial malignancy. No evidence of extrauterine invasion. 2. No pelvic or retroperitoneal adenopathy. 3. Right middle lobe 4 mm solid pulmonary nodule, for which follow-up chest CT is advised in 3-6 months. 4. Several findings that are equivocal for distant metastatic disease. Vaguely nodular heterogeneous hyperenhancement in the peripheral right liver lobe, which could represent benign transient perfusional phenomena, with underlying liver lesions  not entirely excluded. Mildly sclerotic T12 vertebral lesion. Mildly enlarged right axillary lymph node. The best single test to further evaluate these findings would be a PET-CT. Alternative tests include bone  scan or thoracic MRI without and with IV contrast for the T12 osseous lesion, MRI abdomen without and with IV contrast for the liver findings, and diagnostic mammographic evaluation for the right axillary node. 5.  Aortic Atherosclerosis (ICD10-I70.0).    01/09/2018 PET scan   1. Moderate hypermetabolism corresponding to enlarging axillary node since 12/22/2017. Highly suspicious for an atypical distribution of metastatic disease. 2. No hypermetabolism to suggest hepatic or T12 osseous metastasis. 3. Hypermetabolic endometrial primary.   01/23/2018 Initial Diagnosis   Endometrial cancer ([place])   01/23/2018 Pathology Results   1. Lymph node, sentinel, biopsy, left external iliac - NO CARCINOMA IDENTIFIED IN ONE LYMPH NODE (0/1) - SEE COMMENT 2. Lymph nodes, regional resection, right para aortic - NO CARCINOMA IDENTIFIED IN FOUR LYMPH NODES (0/4) - SEE COMMENT 3. Lymph nodes, regional resection, right pelvic - NO CARCINOMA IDENTIFIED IN EIGHT LYMPH NODES (0/8) - SEE COMMENT 4. Cul-de-sac biopsy - METASTATIC CARCINOMA 5. Uterus +/- tubes/ovaries, neoplastic, cervix, bilateral fallopian tubes and ovaries UTERUS: - MIXED SEROUS AND ENDOMETRIOID CARCINOMA - SEROSAL IMPLANTS PRESENT - LYMPHOVASCULAR SPACE INVASION PRESENT - LEIOMYOMATA (1.5 CM; LARGEST) - SEE ONCOLOGY TABLE AND COMMENT BELOW CERVIX: - BENIGN NABOTHIAN CYSTS - NO CARCINOMA IDENTIFIED BILATERAL OVARIES: - METASTATIC CARCINOMA PRESENT ON OVARIAN SURFACE BILATERAL FALLOPIAN TUBES: - INTRALUMINAL CARCINOMA Microscopic Comment 1. -3. Cytokeratin AE1/3 was performed on the sentinel lymph nodes to exclude micrometastasis. There is no evidence of metastatic carcinoma by immunohistochemistry. 5. UTERUS, CARCINOMA OR CARCINOSARCOMA  Procedure: Hysterectomy, bilateral salpingo-oophorectomy, peritoneal biopsy, sentinel lymph node biopsy and pelvic lymph node resection Histologic type: Mixed carcinoma composed of serous carcinoma  (~80%) and endometrioid carcinoma (~20%) Histologic Grade: N/A Myometrial invasion: Estimated less than 50% myometrial invasion (0.3 cm of myometrium involved; 1.4 cm measured thickness) Uterine Serosa Involvement: Present Cervical stromal involvement: Not identified Extent of involvement of other organs: - Fallopian tube (left within the lumen) - Ovary, left (surface involvement) - Cul-de-sac Lymphovascular invasion: Present Regional Lymph Nodes: Examined: 1 Sentinel 12 Non-sentinel 13 Total Lymph nodes with metastasis: 0 Isolated tumor cells (< 0.2 mm): 0 Micrometastasis: (> 0.2 mm and < 2.0 mm): 0 Macrometastasis: (> 2.0 mm): 0 Extracapsular extension: N/A Tumor block for ancillary studies: 5E, 5B MMR / MSI testing: Pending will be reported separately Pathologic Stage Classification (pTNM, AJCC 8th edition): pT3a, pN0 FIGO Stage: IIIA COMMENT: There is tumor present on the surface of the left ovary and within the lumen of the left fallopian tube. The carcinoma appears to be mixed with the largest component being high grade serous carcinoma. Dr. Lyndon Code reviewed the case and agrees with the above diagnosis.   01/23/2018 Surgery   Surgeon: Donaciano Eva   Operation: Robotic-assisted laparoscopic total hysterectomy with bilateral salpingoophorectomy, SLN injection, mapping and biopsy, right pelvic and para-aortic lymphadenectomy  Operative Findings:  : 10-12cm bulky uterus with frank serosal involvement on posterior cul de sac peritoneum and anterior peritoneum which adhesed the bladder to the anterior uterus. Unilateral mapping on left pelvis. No grossly suspicious nodes. Normal omentum and diaphragms.    02/01/2018 Pathology Results   Lymph node, needle/core biopsy, right axilla - METASTATIC CARCINOMA - SEE COMMENT Microscopic Comment The neoplastic cells are positive for cytokeratin 7 and Pax-8 but negative for cytokeratin 5/6, cytokeratin 20, Gata-3, p63, p53 and  GCDFP.  Overall, the immunoprofile is consistent with metastasis from the patient's known gynecologic carcinoma.   02/01/2018 Procedure   Ultrasound-guided core biopsies of a suspicious right axillary lymph node.   02/21/2018 - 06/13/2018 Chemotherapy   The patient had carboplatin & Taxol x 6   03/26/2018 - 04/30/2018 Radiation Therapy   Radiation treatment dates:   03/26/18, 04/02/18, 04/19/18, 04/23/18 04/30/18  Site/dose: proximal Vagina, 6 Gy in 5 fractions for a total dose of 30 Gy    04/26/2018 PET scan   1. Interval resection of the hypermetabolic endometrial primary. No discernible hypermetabolic pelvic sidewall or peritoneal metastases. 2. Stable hypermetabolic bilateral axillary lymphadenopathy. The degree of hypermetabolism in these lymph nodes remains highly suspicious for neoplasm. 3. Interval development of low level FDG uptake in 2 stable, small lymph nodes in the left groin region. This may be reactive, but close attention recommended to exclude metastatic involvement. 4. Stable non hypermetabolic low-density liver lesions and the mixed lucent and sclerotic T12 lesion is stable without hypermetabolism today.   07/09/2018 Imaging   Status post hysterectomy.  Mild axillary lymphadenopathy, improved. Nodal metastases not excluded.  Irregular wall thickening involving the anterior bladder, possibly reflecting radiation cystitis versus tumor.  Additional stable findings as above, including a 5 mm right middle lobe nodule and stable sclerosis involving the T12 vertebral body.   10/10/2018 Imaging   1. Stable exam. No findings within the abdomen or pelvis to suggest metastatic disease. 2. Unchanged appearance of sclerosis involving the T12 vertebra. 3.  Aortic Atherosclerosis (ICD10-I70.0).   10/10/2018 Imaging   Ct abdomen and pelvis 1. Stable exam. No findings within the abdomen or pelvis to suggest metastatic disease. 2. Unchanged appearance of sclerosis involving the T12  vertebra. 3.  Aortic Atherosclerosis (ICD10-I70.0).   01/16/2019 PET scan   1. Enlargement and increased activity in the left axillary, right axillary, and left inguinal adenopathy compatible with progressive malignancy. 2. Stable 4 mm right middle lobe subpleural nodule, not appreciably hypermetabolic. 3. Other imaging findings of potential clinical significance: Right kidney lower pole cyst. Aortic Atherosclerosis (ICD10-I70.0). Prominent stool throughout the colon favors constipation. Mild chronic left maxillary sinusitis.     02/01/2019 -  Chemotherapy   The patient had pembrolizumab and Lenvima for chemotherapy treatment.     04/25/2019 Imaging   Ct imaging 1. Interval response to therapy. Decrease in size of bilateral axillary and left inguinal lymph nodes. No new or progressive findings identified. 2. Stable sclerotic appearance of the T12 vertebra. 3. Aortic atherosclerosis. Lad coronary artery calcification noted.   08/30/2019 Imaging   1. Bulky RIGHT hilar lymph node, slightly increased in size from previous imaging. 2. Multiple foci of hyperenhancement in the liver. 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 Consider MRI liver on follow-up to establish a baseline for number of lesions as these will appear variable on CT follow-up based on phase of contrast acquisition in the future. 3. Stable 5 mm nodule adjacent to the fissure, minor fissure in the RIGHT middle lobe.     11/28/2019 Imaging   1. Stable exam. No new or progressive findings. 2. Stable enlarged right axillary lymph node. 3. Stable tiny bilateral pulmonary nodules. Continued attention on follow-up recommended. 4. Scattered hypodensities in the liver parenchyma correspond to the hypervascular lesions seen on the previous  study performed with intravenous contrast material. 5. Right renal cyst. 6. Aortic Atherosclerosis (ICD10-I70.0).     03/16/2020 Imaging   Increased echogenicity of renal parenchyma is noted bilaterally suggesting medical renal disease. No hydronephrosis or renal obstruction is noted   04/02/2020 Imaging   1. Right axillary lymphadenopathy is stable. Tiny bilateral pulmonary nodules are stable. Subcentimeter low-attenuation liver lesions are stable. 2. No new or progressive metastatic disease in the chest, abdomen or pelvis. 3. Aortic Atherosclerosis (ICD10-I70.0).     REVIEW OF SYSTEMS:   Constitutional: Denies fevers, chills or abnormal weight loss Eyes: Denies blurriness of vision Ears, nose, mouth, throat, and face: Denies mucositis or sore throat Respiratory: Denies cough, dyspnea or wheezes Cardiovascular: Denies palpitation, chest discomfort or lower extremity swelling Gastrointestinal:  Denies nausea, heartburn or change in bowel habits Skin: Denies abnormal skin rashes Lymphatics: Denies new lymphadenopathy or easy bruising Neurological:Denies numbness, tingling or new weaknesses Behavioral/Psych: Mood is stable, no new changes  All other systems were reviewed with the patient and are negative.  I have reviewed the past medical history, past surgical history, social history and family history with the patient and they are unchanged from previous note.  ALLERGIES:  is allergic to sulfa antibiotics and sulfamethoxazole.  MEDICATIONS:  Current Outpatient Medications  Medication Sig Dispense Refill  . Calcium Carb-Cholecalciferol 600-800 MG-UNIT TABS Take 1 tablet by mouth daily.    Marland Kitchen amLODipine (NORVASC) 10 MG tablet Take 10 mg by mouth daily.    . ergocalciferol (VITAMIN D2) 1.25 MG (50000 UT) capsule Take 1 capsule (50,000 Units total) by mouth once a week. 12 capsule 1  . insulin isophane & regular human (NOVOLIN 70/30 FLEXPEN) (70-30) 100 UNIT/ML KwikPen Inject 14  Units into the skin daily with breakfast. 15 mL 3  . lenvatinib 10 mg daily dose (LENVIMA, 10 MG DAILY DOSE,) capsule Take 1 capsule (10 mg total) by mouth daily. 30 capsule 11  . levothyroxine (SYNTHROID) 150 MCG tablet Take 1 tablet (150 mcg total) by mouth daily before breakfast. 30 tablet 3  . lidocaine-prilocaine (EMLA) cream Apply topically daily as needed. 30 g 3  . OneTouch Delica Lancets 88C MISC 1 each by Other route 2 (two) times daily. Use to monitor glucose levels BID; E11.65 100 each 2  . ONETOUCH VERIO test strip 1 each by Other route 3 (three) times daily. And lancets 3/day 300 each 3  . RELION PEN NEEDLES 32G X 4 MM MISC USE 1 PEN PER DAY 50 each 5  . simvastatin (ZOCOR) 10 MG tablet Take 10 mg by mouth at bedtime.      No current facility-administered medications for this visit.   Facility-Administered Medications Ordered in Other Visits  Medication Dose Route Frequency Provider Last Rate Last Admin  . sodium chloride flush (NS) 0.9 % injection 10 mL  10 mL Intracatheter PRN Alvy Bimler, Quenisha Lovins, MD   10 mL at 08/06/20 0943    PHYSICAL EXAMINATION: ECOG PERFORMANCE STATUS: 1 - Symptomatic but completely ambulatory  Vitals:   08/06/20 0805  BP: (!) 162/101  Pulse: 94  Resp: 18  Temp: (!) 97.5 F (36.4 C)  SpO2: 100%   Filed Weights   08/06/20 0805  Weight: 174 lb 12.8 oz (79.3 kg)    GENERAL:alert, no distress and comfortable SKIN: skin color, texture, turgor are normal, no rashes or significant lesions EYES: normal, Conjunctiva are pink and non-injected, sclera clear OROPHARYNX:no exudate, no erythema and lips, buccal mucosa, and tongue normal  NECK: supple, thyroid normal  size, non-tender, without nodularity LYMPH:  no palpable lymphadenopathy in the cervical, axillary or inguinal LUNGS: clear to auscultation and percussion with normal breathing effort HEART: regular rate & rhythm and no murmurs and no lower extremity edema ABDOMEN:abdomen soft, non-tender and  normal bowel sounds Musculoskeletal:no cyanosis of digits and no clubbing  NEURO: alert & oriented x 3 with fluent speech, no focal motor/sensory deficits  LABORATORY DATA:  I have reviewed the data as listed    Component Value Date/Time   NA 143 08/06/2020 0743   K 3.8 08/06/2020 0743   CL 110 08/06/2020 0743   CO2 20 (L) 08/06/2020 0743   GLUCOSE 136 (H) 08/06/2020 0743   BUN 24 (H) 08/06/2020 0743   CREATININE 1.57 (H) 08/06/2020 0743   CREATININE 1.75 (H) 01/10/2020 1200   CALCIUM 8.3 (L) 08/06/2020 0743   PROT 6.5 08/06/2020 0743   ALBUMIN 3.6 08/06/2020 0743   AST 17 08/06/2020 0743   AST 13 (L) 01/10/2020 1200   ALT 12 08/06/2020 0743   ALT 11 01/10/2020 1200   ALKPHOS 70 08/06/2020 0743   BILITOT 0.5 08/06/2020 0743   BILITOT 0.4 01/10/2020 1200   GFRNONAA 35 (L) 08/06/2020 0743   GFRNONAA 29 (L) 01/10/2020 1200   GFRAA 34 (L) 01/10/2020 1200    No results found for: SPEP, UPEP  Lab Results  Component Value Date   WBC 4.5 08/06/2020   NEUTROABS 3.1 08/06/2020   HGB 11.9 (L) 08/06/2020   HCT 36.6 08/06/2020   MCV 88.4 08/06/2020   PLT 194 08/06/2020      Chemistry      Component Value Date/Time   NA 143 08/06/2020 0743   K 3.8 08/06/2020 0743   CL 110 08/06/2020 0743   CO2 20 (L) 08/06/2020 0743   BUN 24 (H) 08/06/2020 0743   CREATININE 1.57 (H) 08/06/2020 0743   CREATININE 1.75 (H) 01/10/2020 1200      Component Value Date/Time   CALCIUM 8.3 (L) 08/06/2020 0743   ALKPHOS 70 08/06/2020 0743   AST 17 08/06/2020 0743   AST 13 (L) 01/10/2020 1200   ALT 12 08/06/2020 0743   ALT 11 01/10/2020 1200   BILITOT 0.5 08/06/2020 0743   BILITOT 0.4 01/10/2020 1200

## 2020-08-11 ENCOUNTER — Telehealth: Payer: Self-pay

## 2020-08-11 NOTE — Telephone Encounter (Signed)
She called and left a message to call her. She is asking when she should get the 2nd covid booster vaccine.  Called back and per Dr. Alvy Bimler, she is recommending to wait until the fall. When flu seasons starts and covid community numbers start going up again. She verbalized understanding.

## 2020-08-27 ENCOUNTER — Inpatient Hospital Stay: Payer: Medicare Other | Attending: Gynecology

## 2020-08-27 ENCOUNTER — Inpatient Hospital Stay: Payer: Medicare Other

## 2020-08-27 ENCOUNTER — Other Ambulatory Visit: Payer: Self-pay

## 2020-08-27 VITALS — BP 154/86 | HR 84 | Temp 97.8°F | Resp 18 | Wt 170.1 lb

## 2020-08-27 DIAGNOSIS — Z7189 Other specified counseling: Secondary | ICD-10-CM

## 2020-08-27 DIAGNOSIS — C773 Secondary and unspecified malignant neoplasm of axilla and upper limb lymph nodes: Secondary | ICD-10-CM

## 2020-08-27 DIAGNOSIS — E039 Hypothyroidism, unspecified: Secondary | ICD-10-CM

## 2020-08-27 DIAGNOSIS — C541 Malignant neoplasm of endometrium: Secondary | ICD-10-CM

## 2020-08-27 DIAGNOSIS — Z5112 Encounter for antineoplastic immunotherapy: Secondary | ICD-10-CM | POA: Insufficient documentation

## 2020-08-27 LAB — CBC WITH DIFFERENTIAL/PLATELET
Abs Immature Granulocytes: 0.04 10*3/uL (ref 0.00–0.07)
Basophils Absolute: 0 10*3/uL (ref 0.0–0.1)
Basophils Relative: 0 %
Eosinophils Absolute: 0 10*3/uL (ref 0.0–0.5)
Eosinophils Relative: 1 %
HCT: 36.9 % (ref 36.0–46.0)
Hemoglobin: 11.7 g/dL — ABNORMAL LOW (ref 12.0–15.0)
Immature Granulocytes: 1 %
Lymphocytes Relative: 25 %
Lymphs Abs: 1.3 10*3/uL (ref 0.7–4.0)
MCH: 27.7 pg (ref 26.0–34.0)
MCHC: 31.7 g/dL (ref 30.0–36.0)
MCV: 87.4 fL (ref 80.0–100.0)
Monocytes Absolute: 0.3 10*3/uL (ref 0.1–1.0)
Monocytes Relative: 5 %
Neutro Abs: 3.5 10*3/uL (ref 1.7–7.7)
Neutrophils Relative %: 68 %
Platelets: 179 10*3/uL (ref 150–400)
RBC: 4.22 MIL/uL (ref 3.87–5.11)
RDW: 14.1 % (ref 11.5–15.5)
WBC: 5.1 10*3/uL (ref 4.0–10.5)
nRBC: 0 % (ref 0.0–0.2)

## 2020-08-27 LAB — COMPREHENSIVE METABOLIC PANEL
ALT: 13 U/L (ref 0–44)
AST: 15 U/L (ref 15–41)
Albumin: 3.5 g/dL (ref 3.5–5.0)
Alkaline Phosphatase: 72 U/L (ref 38–126)
Anion gap: 11 (ref 5–15)
BUN: 19 mg/dL (ref 8–23)
CO2: 23 mmol/L (ref 22–32)
Calcium: 8.5 mg/dL — ABNORMAL LOW (ref 8.9–10.3)
Chloride: 111 mmol/L (ref 98–111)
Creatinine, Ser: 1.45 mg/dL — ABNORMAL HIGH (ref 0.44–1.00)
GFR, Estimated: 39 mL/min — ABNORMAL LOW (ref >60)
Glucose, Bld: 134 mg/dL — ABNORMAL HIGH (ref 70–99)
Potassium: 3.3 mmol/L — ABNORMAL LOW (ref 3.5–5.1)
Sodium: 145 mmol/L (ref 135–145)
Total Bilirubin: 0.6 mg/dL (ref 0.3–1.2)
Total Protein: 6.3 g/dL — ABNORMAL LOW (ref 6.5–8.1)

## 2020-08-27 LAB — TSH: TSH: 4.668 u[IU]/mL — ABNORMAL HIGH (ref 0.308–3.960)

## 2020-08-27 LAB — TOTAL PROTEIN, URINE DIPSTICK: Protein, ur: 30 mg/dL — AB

## 2020-08-27 MED ORDER — SODIUM CHLORIDE 0.9% FLUSH
10.0000 mL | INTRAVENOUS | Status: DC | PRN
  Administered 2020-08-27: 10 mL
  Filled 2020-08-27: qty 10

## 2020-08-27 MED ORDER — SODIUM CHLORIDE 0.9% FLUSH
10.0000 mL | Freq: Once | INTRAVENOUS | Status: AC
  Administered 2020-08-27: 10 mL
  Filled 2020-08-27: qty 10

## 2020-08-27 MED ORDER — HEPARIN SOD (PORK) LOCK FLUSH 100 UNIT/ML IV SOLN
500.0000 [IU] | Freq: Once | INTRAVENOUS | Status: AC | PRN
  Administered 2020-08-27: 500 [IU]
  Filled 2020-08-27: qty 5

## 2020-08-27 MED ORDER — SODIUM CHLORIDE 0.9 % IV SOLN
200.0000 mg | Freq: Once | INTRAVENOUS | Status: AC
  Administered 2020-08-27: 200 mg via INTRAVENOUS
  Filled 2020-08-27: qty 8

## 2020-08-27 MED ORDER — SODIUM CHLORIDE 0.9 % IV SOLN
Freq: Once | INTRAVENOUS | Status: AC
  Filled 2020-08-27: qty 250

## 2020-08-27 NOTE — Patient Instructions (Addendum)
[facility]  Discharge Instructions: Thank you for choosing [facility] to provide your oncology and hematology care.   If you have a lab appointment with the [facility], please go directly to the [facility] and check in at the registration area.   Wear comfortable clothing and clothing appropriate for easy access to any Portacath or PICC line.   We strive to give you quality time with your provider. You may need to reschedule your appointment if you arrive late (15 or more minutes).  Arriving late affects you and other patients whose appointments are after yours.  Also, if you miss three or more appointments without notifying the office, you may be dismissed from the clinic at the provider's discretion.      For prescription refill requests, have your pharmacy contact our office and allow 72 hours for refills to be completed.    Today you received the following chemotherapy and/or immunotherapy agents Keytruda    To help prevent nausea and vomiting after your treatment, we encourage you to take your nausea medication as directed.  BELOW ARE SYMPTOMS THAT SHOULD BE REPORTED IMMEDIATELY: . *FEVER GREATER THAN 100.4 F (38 C) OR HIGHER . *CHILLS OR SWEATING . *NAUSEA AND VOMITING THAT IS NOT CONTROLLED WITH YOUR NAUSEA MEDICATION . *UNUSUAL SHORTNESS OF BREATH . *UNUSUAL BRUISING OR BLEEDING . *URINARY PROBLEMS (pain or burning when urinating, or frequent urination) . *BOWEL PROBLEMS (unusual diarrhea, constipation, pain near the anus) . TENDERNESS IN MOUTH AND THROAT WITH OR WITHOUT PRESENCE OF ULCERS (sore throat, sores in mouth, or a toothache) . UNUSUAL RASH, SWELLING OR PAIN  . UNUSUAL VAGINAL DISCHARGE OR ITCHING   Items with * indicate a potential emergency and should be followed up as soon as possible or go to the Emergency Department if any problems should occur.  Please show the CHEMOTHERAPY ALERT CARD or IMMUNOTHERAPY ALERT  CARD at check-in to the Emergency Department and triage nurse.  Should you have questions after your visit or need to cancel or reschedule your appointment, please contact [facility]  Dept: [phone]  and follow the prompts.  Office hours are 8:00 a.m. to 4:30 p.m. Monday - Friday. Please note that voicemails left after 4:00 p.m. may not be returned until the following business day.  We are closed weekends and major holidays. You have access to a nurse at all times for urgent questions. Please call the main number to the clinic Dept: [phone] and follow the prompts.   For any non-urgent questions, you may also contact your provider using MyChart. We now offer e-Visits for anyone 59 and older to request care online for non-urgent symptoms. For details visit [email or web address].   Also download the MyChart app! Go to the app store, search "MyChart", open the app, select [facility], and log in with your MyChart username and password.  Due to Covid, a mask is required upon entering the hospital/clinic. If you do not have a mask, one will be given to you upon arrival. For doctor visits, patients may have 1 support person aged 39 or older with Pembrolizumab injection What is this medicine? PEMBROLIZUMAB (pem broe liz ue mab) is a monoclonal antibody. It is used to treat certain types of cancer. This medicine may be used for other purposes; ask your health care provider or pharmacist if you have questions. COMMON BRAND NAME(S): Keytruda What should I tell my health care provider before I take this medicine? They  need to know if you have any of these conditions:  autoimmune diseases like Crohn's disease, ulcerative colitis, or lupus  have had or planning to have an allogeneic stem cell transplant (uses someone else's stem cells)  history of organ transplant  history of chest radiation  nervous system problems like myasthenia gravis or Guillain-Barre  syndrome  an unusual or allergic reaction to pembrolizumab, other medicines, foods, dyes, or preservatives  pregnant or trying to get pregnant  breast-feeding How should I use this medicine? This medicine is for infusion into a vein. It is given by a health care professional in a hospital or clinic setting. A special MedGuide will be given to you before each treatment. Be sure to read this information carefully each time. Talk to your pediatrician regarding the use of this medicine in children. While this drug may be prescribed for children as young as 6 months for selected conditions, precautions do apply. Overdosage: If you think you have taken too much of this medicine contact a poison control center or emergency room at once. NOTE: This medicine is only for you. Do not share this medicine with others. What if I miss a dose? It is important not to miss your dose. Call your doctor or health care professional if you are unable to keep an appointment. What may interact with this medicine? Interactions have not been studied. This list may not describe all possible interactions. Give your health care provider a list of all the medicines, herbs, non-prescription drugs, or dietary supplements you use. Also tell them if you smoke, drink alcohol, or use illegal drugs. Some items may interact with your medicine. What should I watch for while using this medicine? Your condition will be monitored carefully while you are receiving this medicine. You may need blood work done while you are taking this medicine. Do not become pregnant while taking this medicine or for 4 months after stopping it. Women should inform their doctor if they wish to become pregnant or think they might be pregnant. There is a potential for serious side effects to an unborn child. Talk to your health care professional or pharmacist for more information. Do not breast-feed an infant while taking this medicine or for 4 months after  the last dose. What side effects may I notice from receiving this medicine? Side effects that you should report to your doctor or health care professional as soon as possible:  allergic reactions like skin rash, itching or hives, swelling of the face, lips, or tongue  bloody or black, tarry  breathing problems  changes in vision  chest pain  chills  confusion  constipation  cough  diarrhea  dizziness or feeling faint or lightheaded  fast or irregular heartbeat  fever  flushing  joint pain  low blood counts - this medicine may decrease the number of white blood cells, red blood cells and platelets. You may be at increased risk for infections and bleeding.  muscle pain  muscle weakness  pain, tingling, numbness in the hands or feet  persistent headache  redness, blistering, peeling or loosening of the skin, including inside the mouth  signs and symptoms of high blood sugar such as dizziness; dry mouth; dry skin; fruity breath; nausea; stomach pain; increased hunger or thirst; increased urination  signs and symptoms of kidney injury like trouble passing urine or change in the amount of urine  signs and symptoms of liver injury like dark urine, light-colored stools, loss of appetite, nausea, right upper belly pain,  yellowing of the eyes or skin  sweating  swollen lymph nodes  weight loss Side effects that usually do not require medical attention (report to your doctor or health care professional if they continue or are bothersome):  decreased appetite  hair loss  tiredness This list may not describe all possible side effects. Call your doctor for medical advice about side effects. You may report side effects to FDA at 1-800-FDA-1088. Where should I keep my medicine? This drug is given in a hospital or clinic and will not be stored at home. NOTE: This sheet is a summary. It may not cover all possible information. If you have questions about this medicine,  talk to your doctor, pharmacist, or health care provider.  2021 [employer] (2019-03-06 21:44:53) them. For treatment visits, patients cannot have anyone with them due to current Covid guidelines and our immunocompromised population.

## 2020-09-09 DIAGNOSIS — M170 Bilateral primary osteoarthritis of knee: Secondary | ICD-10-CM | POA: Diagnosis not present

## 2020-09-15 ENCOUNTER — Telehealth: Payer: Self-pay

## 2020-09-15 ENCOUNTER — Ambulatory Visit (HOSPITAL_COMMUNITY)
Admission: RE | Admit: 2020-09-15 | Discharge: 2020-09-15 | Disposition: A | Discharge status: Home / Self Care | Payer: Medicare Other | Source: Ambulatory Visit | Attending: Hematology and Oncology | Admitting: Hematology and Oncology

## 2020-09-15 ENCOUNTER — Other Ambulatory Visit: Payer: Self-pay

## 2020-09-15 DIAGNOSIS — R590 Localized enlarged lymph nodes: Secondary | ICD-10-CM | POA: Diagnosis not present

## 2020-09-15 DIAGNOSIS — C541 Malignant neoplasm of endometrium: Secondary | ICD-10-CM | POA: Diagnosis not present

## 2020-09-15 DIAGNOSIS — R911 Solitary pulmonary nodule: Secondary | ICD-10-CM | POA: Diagnosis not present

## 2020-09-15 DIAGNOSIS — C55 Malignant neoplasm of uterus, part unspecified: Secondary | ICD-10-CM | POA: Diagnosis not present

## 2020-09-15 DIAGNOSIS — I700 Atherosclerosis of aorta: Secondary | ICD-10-CM | POA: Diagnosis not present

## 2020-09-15 DIAGNOSIS — I10 Essential (primary) hypertension: Secondary | ICD-10-CM | POA: Diagnosis not present

## 2020-09-15 NOTE — Telephone Encounter (Signed)
RN returned call, voicemail left for call back.  

## 2020-09-17 ENCOUNTER — Inpatient Hospital Stay: Payer: Medicare Other | Attending: Gynecology

## 2020-09-17 ENCOUNTER — Telehealth: Payer: Self-pay | Admitting: Hematology and Oncology

## 2020-09-17 ENCOUNTER — Other Ambulatory Visit: Payer: Self-pay

## 2020-09-17 ENCOUNTER — Inpatient Hospital Stay (HOSPITAL_BASED_OUTPATIENT_CLINIC_OR_DEPARTMENT_OTHER): Payer: Medicare Other | Admitting: Hematology and Oncology

## 2020-09-17 ENCOUNTER — Inpatient Hospital Stay: Payer: Medicare Other

## 2020-09-17 ENCOUNTER — Encounter: Payer: Self-pay | Admitting: Hematology and Oncology

## 2020-09-17 VITALS — BP 150/93

## 2020-09-17 DIAGNOSIS — C541 Malignant neoplasm of endometrium: Secondary | ICD-10-CM

## 2020-09-17 DIAGNOSIS — I10 Essential (primary) hypertension: Secondary | ICD-10-CM

## 2020-09-17 DIAGNOSIS — Z5112 Encounter for antineoplastic immunotherapy: Secondary | ICD-10-CM | POA: Diagnosis not present

## 2020-09-17 DIAGNOSIS — E039 Hypothyroidism, unspecified: Secondary | ICD-10-CM

## 2020-09-17 DIAGNOSIS — I129 Hypertensive chronic kidney disease with stage 1 through stage 4 chronic kidney disease, or unspecified chronic kidney disease: Secondary | ICD-10-CM | POA: Diagnosis not present

## 2020-09-17 DIAGNOSIS — C773 Secondary and unspecified malignant neoplasm of axilla and upper limb lymph nodes: Secondary | ICD-10-CM

## 2020-09-17 DIAGNOSIS — N183 Chronic kidney disease, stage 3 unspecified: Secondary | ICD-10-CM

## 2020-09-17 DIAGNOSIS — Z79899 Other long term (current) drug therapy: Secondary | ICD-10-CM | POA: Diagnosis not present

## 2020-09-17 DIAGNOSIS — Z7189 Other specified counseling: Secondary | ICD-10-CM

## 2020-09-17 LAB — COMPREHENSIVE METABOLIC PANEL
ALT: 13 U/L (ref 0–44)
AST: 14 U/L — ABNORMAL LOW (ref 15–41)
Albumin: 3.5 g/dL (ref 3.5–5.0)
Alkaline Phosphatase: 72 U/L (ref 38–126)
Anion gap: 14 (ref 5–15)
BUN: 32 mg/dL — ABNORMAL HIGH (ref 8–23)
CO2: 19 mmol/L — ABNORMAL LOW (ref 22–32)
Calcium: 9.7 mg/dL (ref 8.9–10.3)
Chloride: 110 mmol/L (ref 98–111)
Creatinine, Ser: 1.56 mg/dL — ABNORMAL HIGH (ref 0.44–1.00)
GFR, Estimated: 36 mL/min — ABNORMAL LOW (ref >60)
Glucose, Bld: 177 mg/dL — ABNORMAL HIGH (ref 70–99)
Potassium: 3.7 mmol/L (ref 3.5–5.1)
Sodium: 143 mmol/L (ref 135–145)
Total Bilirubin: 0.6 mg/dL (ref 0.3–1.2)
Total Protein: 6.6 g/dL (ref 6.5–8.1)

## 2020-09-17 LAB — CBC WITH DIFFERENTIAL/PLATELET
Abs Immature Granulocytes: 0.06 10*3/uL (ref 0.00–0.07)
Basophils Absolute: 0 10*3/uL (ref 0.0–0.1)
Basophils Relative: 0 %
Eosinophils Absolute: 0 10*3/uL (ref 0.0–0.5)
Eosinophils Relative: 0 %
HCT: 38.9 % (ref 36.0–46.0)
Hemoglobin: 12.2 g/dL (ref 12.0–15.0)
Immature Granulocytes: 1 %
Lymphocytes Relative: 24 %
Lymphs Abs: 1.4 10*3/uL (ref 0.7–4.0)
MCH: 27.7 pg (ref 26.0–34.0)
MCHC: 31.4 g/dL (ref 30.0–36.0)
MCV: 88.2 fL (ref 80.0–100.0)
Monocytes Absolute: 0.3 10*3/uL (ref 0.1–1.0)
Monocytes Relative: 5 %
Neutro Abs: 4.2 10*3/uL (ref 1.7–7.7)
Neutrophils Relative %: 70 %
Platelets: 196 10*3/uL (ref 150–400)
RBC: 4.41 MIL/uL (ref 3.87–5.11)
RDW: 14.9 % (ref 11.5–15.5)
WBC: 5.9 10*3/uL (ref 4.0–10.5)
nRBC: 0 % (ref 0.0–0.2)

## 2020-09-17 LAB — TSH: TSH: 7.475 u[IU]/mL — ABNORMAL HIGH (ref 0.308–3.960)

## 2020-09-17 LAB — TOTAL PROTEIN, URINE DIPSTICK: Protein, ur: <30 mg/dL

## 2020-09-17 MED ORDER — SODIUM CHLORIDE 0.9 % IV SOLN
Freq: Once | INTRAVENOUS | Status: AC
  Filled 2020-09-17: qty 250

## 2020-09-17 MED ORDER — SODIUM CHLORIDE 0.9% FLUSH
10.0000 mL | INTRAVENOUS | Status: DC | PRN
  Filled 2020-09-17: qty 10

## 2020-09-17 MED ORDER — SODIUM CHLORIDE 0.9% FLUSH
10.0000 mL | Freq: Once | INTRAVENOUS | Status: AC
  Administered 2020-09-17: 10 mL
  Filled 2020-09-17: qty 10

## 2020-09-17 MED ORDER — SODIUM CHLORIDE 0.9 % IV SOLN
200.0000 mg | Freq: Once | INTRAVENOUS | Status: AC
  Administered 2020-09-17: 200 mg via INTRAVENOUS
  Filled 2020-09-17: qty 8

## 2020-09-17 MED ORDER — HEPARIN SOD (PORK) LOCK FLUSH 100 UNIT/ML IV SOLN
500.0000 [IU] | Freq: Once | INTRAVENOUS | Status: DC | PRN
  Filled 2020-09-17: qty 5

## 2020-09-17 NOTE — Progress Notes (Signed)
[place] OFFICE PROGRESS NOTE  Patient Care Team: Jonathon Jordan, MD as PCP - General (Family Medicine) Awanda Mink Craige Cotta, RN as Registered Nurse (Oncology)  ASSESSMENT & PLAN:  Endometrial cancer [facility]) Overall, her exam is benign CT imaging show persistent right axillary lymphadenopathy but overall stable in size She tolerated treatment very well without major side effects except hypertension We will continue treatment as scheduled I plan to space out her imaging study to every 6 months, her next imaging will be in December  Chronic kidney disease (CKD), stage III (moderate) ([place]) Recent renal function fluctuates up and down We discussed the importance of aggressive risk factor modification  Essential hypertension She has poorly controlled hypertension Her primary care doctor has started her on different blood pressure medication recently I will defer to her primary care for further management   No orders of the defined types were placed in this encounter.   All questions were answered. The patient knows to call the clinic with any problems, questions or concerns. The total time spent in the appointment was 20 minutes encounter with patients including review of chart and various tests results, discussions about plan of care and coordination of care plan   [name], MD 09/17/2020 9:29 AM  INTERVAL HISTORY: Please see below for problem oriented charting. 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 Leo Grosser who obtained an endometrial biopsy showing poorly differentiated endometrial carcinoma and negative endocervical curettage   12/12/2017 Imaging   MAMMOGRAM FINDINGS: In the right axilla, a possible mass warrants further evaluation. In the left breast, no findings suspicious for malignancy.  Images were processed with CAD.  IMPRESSION: Further evaluation is suggested for possible mass in the right axilla.    12/24/2017 Imaging   Ct scan chest, abdomen and pelvis 1. Marked thickening of the endometrium (42 mm) compatible with known primary endometrial malignancy. No evidence of extrauterine invasion. 2. No pelvic or retroperitoneal adenopathy. 3. Right middle lobe 4 mm solid pulmonary nodule, for which follow-up chest CT is advised in 3-6 months. 4. Several findings that are equivocal for distant metastatic disease. Vaguely nodular heterogeneous hyperenhancement in the peripheral right liver lobe, which could represent benign transient perfusional phenomena, with underlying liver lesions not entirely excluded. Mildly sclerotic T12 vertebral lesion. Mildly enlarged right axillary lymph node. The best single test to further evaluate these findings would be a PET-CT. Alternative tests include bone scan or thoracic MRI without and with IV contrast for the T12 osseous lesion, MRI abdomen without and with IV contrast for the liver findings, and diagnostic mammographic evaluation for the right axillary node. 5.  Aortic Atherosclerosis (ICD10-I70.0).    01/09/2018 PET scan   1. Moderate hypermetabolism corresponding to enlarging axillary node since 12/22/2017. Highly suspicious for an atypical distribution of metastatic disease. 2. No hypermetabolism to suggest hepatic or T12 osseous metastasis. 3. Hypermetabolic endometrial primary.   01/23/2018 Initial Diagnosis   Endometrial cancer ([place])   01/23/2018 Pathology Results   1. Lymph node, sentinel, biopsy, left external  iliac - NO CARCINOMA  IDENTIFIED IN ONE LYMPH NODE (0/1) - SEE COMMENT 2. Lymph nodes, regional resection, right para aortic - NO CARCINOMA IDENTIFIED IN FOUR LYMPH NODES (0/4) - SEE COMMENT 3. Lymph nodes, regional resection, right pelvic - NO CARCINOMA IDENTIFIED IN EIGHT LYMPH NODES (0/8) - SEE COMMENT 4. Cul-de-sac biopsy - METASTATIC CARCINOMA 5. Uterus +/- tubes/ovaries, neoplastic, cervix, bilateral fallopian tubes and ovaries UTERUS: - MIXED SEROUS AND ENDOMETRIOID CARCINOMA - SEROSAL IMPLANTS PRESENT - LYMPHOVASCULAR SPACE INVASION PRESENT - LEIOMYOMATA (1.5 CM; LARGEST) - SEE ONCOLOGY TABLE AND COMMENT BELOW CERVIX: - BENIGN NABOTHIAN CYSTS - NO CARCINOMA IDENTIFIED BILATERAL OVARIES: - METASTATIC CARCINOMA PRESENT ON OVARIAN SURFACE BILATERAL FALLOPIAN TUBES: - INTRALUMINAL CARCINOMA Microscopic Comment 1. -3. Cytokeratin AE1/3 was performed on the sentinel lymph nodes to exclude micrometastasis. There is no evidence of metastatic carcinoma by immunohistochemistry. 5. 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%) Histologic Grade: N/A Myometrial invasion: Estimated less than 50% myometrial invasion (0.3 cm of myometrium involved; 1.4 cm measured thickness) Uterine Serosa Involvement: Present Cervical stromal involvement: Not identified Extent of involvement of other organs: - Fallopian tube (left within the lumen) - Ovary, left (surface involvement) - Cul-de-sac Lymphovascular invasion: Present Regional Lymph Nodes: Examined: 1 Sentinel 12 Non-sentinel 13 Total Lymph nodes with metastasis: 0 Isolated tumor cells (< 0.2 mm): 0 Micrometastasis: (> 0.2 mm and < 2.0 mm): 0 Macrometastasis: (> 2.0 mm): 0 Extracapsular extension:  N/A Tumor block for ancillary studies: 5E, 5B MMR / MSI testing: Pending will be reported separately Pathologic Stage Classification (pTNM, AJCC 8th edition): pT3a, pN0 FIGO Stage: IIIA COMMENT: There is tumor present on the surface of the left ovary and within the lumen of the left fallopian tube. The carcinoma appears to be mixed with the largest component being high grade serous carcinoma. Dr. Lyndon Code reviewed the case and agrees with the above diagnosis.   01/23/2018 Surgery   Surgeon: Donaciano Eva   Operation: Robotic-assisted laparoscopic total hysterectomy with bilateral salpingoophorectomy, SLN injection, mapping and biopsy, right pelvic and para-aortic lymphadenectomy  Operative Findings:  : 10-12cm bulky uterus with frank serosal involvement on posterior cul de sac peritoneum and anterior peritoneum which adhesed the bladder to the anterior uterus. Unilateral mapping on left pelvis. No grossly suspicious nodes. Normal omentum and diaphragms.    02/01/2018 Pathology Results   Lymph node, needle/core biopsy, right axilla - METASTATIC CARCINOMA - SEE COMMENT Microscopic Comment The neoplastic cells are positive for cytokeratin 7 and Pax-8 but negative for cytokeratin 5/6, cytokeratin 20, Gata-3, p63, p53 and GCDFP. Overall, the immunoprofile is consistent with metastasis from the patient's known gynecologic carcinoma.   02/01/2018 Procedure   Ultrasound-guided core biopsies of a suspicious right axillary lymph node.   02/21/2018 - 06/13/2018 Chemotherapy   The patient had carboplatin & Taxol x 6   03/26/2018 - 04/30/2018 Radiation Therapy   Radiation treatment dates:   03/26/18, 04/02/18, 04/19/18, 04/23/18 04/30/18  Site/dose: proximal Vagina, 6 Gy in 5 fractions for a total dose of 30 Gy    04/26/2018 PET scan   1. Interval resection of the hypermetabolic endometrial primary. No discernible hypermetabolic pelvic sidewall or peritoneal metastases. 2. Stable hypermetabolic  bilateral axillary lymphadenopathy. The degree of hypermetabolism in these lymph nodes remains highly suspicious for neoplasm. 3. Interval development of low level FDG uptake in 2 stable, small lymph nodes in the left  groin region. This may be reactive, but close attention recommended to exclude metastatic involvement. 4. Stable non hypermetabolic low-density liver lesions and the mixed lucent and sclerotic T12 lesion is stable without hypermetabolism today.   07/09/2018 Imaging   Status post hysterectomy.  Mild axillary lymphadenopathy, improved. Nodal metastases not excluded.  Irregular wall thickening involving the anterior bladder, possibly reflecting radiation cystitis versus tumor.  Additional stable findings as above, including a 5 mm right middle lobe nodule and stable sclerosis involving the T12 vertebral body.   10/10/2018 Imaging   1. Stable exam. No findings within the abdomen or pelvis to suggest metastatic disease. 2. Unchanged appearance of sclerosis involving the T12 vertebra. 3.  Aortic Atherosclerosis (ICD10-I70.0).   10/10/2018 Imaging   Ct abdomen and pelvis 1. Stable exam. No findings within the abdomen or pelvis to suggest metastatic disease. 2. Unchanged appearance of sclerosis involving the T12 vertebra. 3.  Aortic Atherosclerosis (ICD10-I70.0).   01/16/2019 PET scan   1. Enlargement and increased activity in the left axillary, right axillary, and left inguinal adenopathy compatible with progressive malignancy. 2. Stable 4 mm right middle lobe subpleural nodule, not appreciably hypermetabolic. 3. Other imaging findings of potential clinical significance: Right kidney lower pole cyst. Aortic Atherosclerosis (ICD10-I70.0). Prominent stool throughout the colon favors constipation. Mild chronic left maxillary sinusitis.     02/01/2019 -  Chemotherapy   The patient had pembrolizumab and Lenvima for chemotherapy treatment.     04/25/2019 Imaging   Ct imaging 1.  Interval response to therapy. Decrease in size of bilateral axillary and left inguinal lymph nodes. No new or progressive findings identified. 2. Stable sclerotic appearance of the T12 vertebra. 3. Aortic atherosclerosis. Lad coronary artery calcification noted.   08/30/2019 Imaging   1. Bulky RIGHT hilar lymph node, slightly increased in size from previous imaging. 2. Multiple foci of hyperenhancement in the liver. 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 Consider MRI liver on follow-up to establish a baseline for number of lesions as these will appear variable on CT follow-up based on phase of contrast acquisition in the future. 3. Stable 5 mm nodule adjacent to the fissure, minor fissure in the RIGHT middle lobe.     11/28/2019 Imaging   1. Stable exam. No new or progressive findings. 2. Stable enlarged right axillary lymph node. 3. Stable tiny bilateral pulmonary nodules. Continued attention on follow-up recommended. 4. Scattered hypodensities in the liver parenchyma correspond to the hypervascular lesions seen on the previous study performed with intravenous contrast material. 5. Right renal cyst. 6. Aortic Atherosclerosis (ICD10-I70.0).     03/16/2020 Imaging   Increased echogenicity of renal parenchyma is noted bilaterally suggesting medical renal disease. No hydronephrosis or renal obstruction is noted   04/02/2020 Imaging   1. Right axillary lymphadenopathy is stable. Tiny bilateral pulmonary nodules are stable. Subcentimeter low-attenuation liver lesions are stable. 2. No new or progressive metastatic disease in the chest, abdomen or pelvis. 3. Aortic Atherosclerosis (ICD10-I70.0).   09/15/2020 Imaging   1. Stable RIGHT axillary lymph node with enlargement measuring 2 cm short  axis. 2. Stable small nodule along the minor fissure in the RIGHT chest. 3. No new or progressive findings. 4. Aortic atherosclerosis.     REVIEW OF SYSTEMS:   Constitutional: Denies fevers, chills or abnormal weight loss  Eyes: Denies blurriness of vision Ears, nose, mouth, throat, and face: Denies mucositis or sore throat Respiratory: Denies cough, dyspnea or wheezes Cardiovascular: Denies palpitation, chest discomfort or lower extremity swelling Gastrointestinal:  Denies nausea, heartburn or change in bowel habits Skin: Denies abnormal skin rashes Lymphatics: Denies new lymphadenopathy or easy bruising Neurological:Denies numbness, tingling or new weaknesses Behavioral/Psych: Mood is stable, no new changes  All other systems were reviewed with the patient and are negative.  I have reviewed the past medical history, past surgical history, social history and family history with the patient and they are unchanged from previous note.  ALLERGIES:  is allergic to sulfa antibiotics and sulfamethoxazole.  MEDICATIONS:  Current Outpatient Medications  Medication Sig Dispense Refill  . amLODipine-valsartan (EXFORGE) 5-160 MG tablet Take 1 tablet by mouth daily.    . Calcium Carb-Cholecalciferol 600-800 MG-UNIT TABS Take 1 tablet by mouth daily.    . ergocalciferol (VITAMIN D2) 1.25 MG (50000 UT) capsule Take 1 capsule (50,000 Units total) by mouth once a week. 12 capsule 1  . insulin isophane & regular human (NOVOLIN 70/30 FLEXPEN) (70-30) 100 UNIT/ML KwikPen Inject 14 Units into the skin daily with breakfast. 15 mL 3  . lenvatinib 10 mg daily dose (LENVIMA, 10 MG DAILY DOSE,) capsule Take 1 capsule (10 mg total) by mouth daily. 30 capsule 11  . levothyroxine (SYNTHROID) 150 MCG tablet Take 1 tablet (150 mcg total) by mouth daily before breakfast. 30 tablet 3  . lidocaine-prilocaine (EMLA) cream Apply topically daily as needed. 30 g 3  . OneTouch Delica Lancets 16S MISC 1 each by Other  route 2 (two) times daily. Use to monitor glucose levels BID; E11.65 100 each 2  . ONETOUCH VERIO test strip 1 each by Other route 3 (three) times daily. And lancets 3/day 300 each 3  . RELION PEN NEEDLES 32G X 4 MM MISC USE 1 PEN PER DAY 50 each 5  . simvastatin (ZOCOR) 10 MG tablet Take 10 mg by mouth at bedtime.      No current facility-administered medications for this visit.   Facility-Administered Medications Ordered in Other Visits  Medication Dose Route Frequency Provider Last Rate Last Admin  . heparin lock flush 100 unit/mL  500 Units Intracatheter Once PRN Alvy Bimler, Sabah Zucco, MD      . pembrolizumab (KEYTRUDA) 200 mg in sodium chloride 0.9 % 50 mL chemo infusion  200 mg Intravenous Once Jeraldine Primeau, MD      . sodium chloride flush (NS) 0.9 % injection 10 mL  10 mL Intracatheter PRN Alvy Bimler, Danea Manter, MD        PHYSICAL EXAMINATION: ECOG PERFORMANCE STATUS: 0 - Asymptomatic  Vitals:   09/17/20 0825  BP: (!) 178/104  Pulse: 96  Resp: 18  Temp: 97.6 F (36.4 C)  SpO2: 100%   Filed Weights   09/17/20 0825  Weight: 168 lb (76.2 kg)    GENERAL:alert, no distress and comfortable NEURO: alert & oriented x 3 with fluent speech, no focal motor/sensory deficits  LABORATORY DATA:  I have reviewed the data as listed    Component Value Date/Time   NA 143 09/17/2020 0749   K 3.7 09/17/2020 0749   CL 110 09/17/2020 0749   CO2 19 (L) 09/17/2020 0749   GLUCOSE 177 (H) 09/17/2020 0749   BUN 32 (H) 09/17/2020 0749   CREATININE 1.56 (H) 09/17/2020 0749   CREATININE 1.75 (H) 01/10/2020 1200   CALCIUM 9.7 09/17/2020 0749   PROT 6.6 09/17/2020 0749   ALBUMIN  3.5 09/17/2020 0749   AST 14 (L) 09/17/2020 0749   AST 13 (L) 01/10/2020 1200   ALT 13 09/17/2020 0749   ALT 11 01/10/2020 1200   ALKPHOS 72 09/17/2020 0749   BILITOT 0.6 09/17/2020 0749   BILITOT 0.4 01/10/2020 1200   GFRNONAA 36 (L) 09/17/2020 0749   GFRNONAA 29 (L) 01/10/2020 1200   GFRAA 34 (L) 01/10/2020 1200    No results  found for: SPEP, UPEP  Lab Results  Component Value Date   WBC 5.9 09/17/2020   NEUTROABS 4.2 09/17/2020   HGB 12.2 09/17/2020   HCT 38.9 09/17/2020   MCV 88.2 09/17/2020   PLT 196 09/17/2020      Chemistry      Component Value Date/Time   NA 143 09/17/2020 0749   K 3.7 09/17/2020 0749   CL 110 09/17/2020 0749   CO2 19 (L) 09/17/2020 0749   BUN 32 (H) 09/17/2020 0749   CREATININE 1.56 (H) 09/17/2020 0749   CREATININE 1.75 (H) 01/10/2020 1200      Component Value Date/Time   CALCIUM 9.7 09/17/2020 0749   ALKPHOS 72 09/17/2020 0749   AST 14 (L) 09/17/2020 0749   AST 13 (L) 01/10/2020 1200   ALT 13 09/17/2020 0749   ALT 11 01/10/2020 1200   BILITOT 0.6 09/17/2020 0749   BILITOT 0.4 01/10/2020 1200       RADIOGRAPHIC STUDIES: I have reviewed multiple imaging studies with the patient I have personally reviewed the radiological images as listed and agreed with the findings in the report. CT Chest Wo Contrast  Result Date: 09/15/2020 CLINICAL DATA:  Uterine/cervical cancer with metastatic disease. Assess treatment response. 70 year old female EXAM: CT CHEST WITHOUT CONTRAST TECHNIQUE: Multidetector CT imaging of the chest was performed following the standard protocol without IV contrast. COMPARISON:  April 02, 2020. FINDINGS: Cardiovascular: RIGHT-sided Port-A-Cath terminates at the distal aspect of the superior vena cava. Heart size is normal. No substantial pericardial effusion. Stable appearance of central pulmonary vasculature. Limited assessment of cardiovascular structures given lack of intravenous contrast. Aortic caliber is normal with scattered aortic atherosclerosis. Mediastinum/Nodes: Thoracic inlet structures are unremarkable. No LEFT axillary lymphadenopathy. RIGHT axillary lymph node with enlargement measuring 2 cm short axis, unchanged compared to previous imaging. Esophagus grossly normal. No mediastinal adenopathy. No gross hilar lymphadenopathy. Lungs/Pleura:  Stable small nodule along the minor fissure in the RIGHT chest (image 59/7) No effusion. No consolidation. Airways are patent. No new pulmonary nodule. Upper Abdomen: Low-density RIGHT renal lesion incompletely imaged, grossly unchanged with respect imaged portions. Imaged portions of liver, gallbladder, spleen, pancreas and adrenal glands are unchanged. No acute upper abdominal findings Musculoskeletal: No chest wall mass no acute or destructive bone process. Spinal degenerative changes. IMPRESSION: 1. Stable RIGHT axillary lymph node with enlargement measuring 2 cm short axis. 2. Stable small nodule along the minor fissure in the RIGHT chest. 3. No new or progressive findings. 4. Aortic atherosclerosis. Aortic Atherosclerosis (ICD10-I70.0). Electronically Signed   By: Zetta Bills M.D.   On: 09/15/2020 13:48

## 2020-09-17 NOTE — Patient Instructions (Signed)
[facility] MEDICAL ONCOLOG[facility]  Discharge Instructions: ?Thank you for choosing [facility] to provide your oncology and hematology care.  ? ?If you have a lab appointment with the Cancer Center, please go directly to the Cancer Center and check in at the registration area. ?  ?Wear comfortable clothing and clothing appropriate for easy access to any Portacath or PICC line.  ? ?We strive to give you quality time with your provider. You may need to reschedule your appointment if you arrive late (15 or more minutes).  Arriving late affects you and other patients whose appointments are after yours.  Also, if you miss three or more appointments without notifying the office, you may be dismissed from the clinic at the provider?s discretion.    ?  ?For prescription refill requests, have your pharmacy contact our office and allow 72 hours for refills to be completed.   ? ?Today you received the following chemotherapy and/or immunotherapy agents: Keytruda ?  ?To help prevent nausea and vomiting after your treatment, we encourage you to take your nausea medication as directed. ? ?BELOW ARE SYMPTOMS THAT SHOULD BE REPORTED IMMEDIATELY: ?*FEVER GREATER THAN 100.4 F (38 ?C) OR HIGHER ?*CHILLS OR SWEATING ?*NAUSEA AND VOMITING THAT IS NOT CONTROLLED WITH YOUR NAUSEA MEDICATION ?*UNUSUAL SHORTNESS OF BREATH ?*UNUSUAL BRUISING OR BLEEDING ?*URINARY PROBLEMS (pain or burning when urinating, or frequent urination) ?*BOWEL PROBLEMS (unusual diarrhea, constipation, pain near the anus) ?TENDERNESS IN MOUTH AND THROAT WITH OR WITHOUT PRESENCE OF ULCERS (sore throat, sores in mouth, or a toothache) ?UNUSUAL RASH, SWELLING OR PAIN  ?UNUSUAL VAGINAL DISCHARGE OR ITCHING  ? ?Items with * indicate a potential emergency and should be followed up as soon as possible or go to the Emergency Department if any problems should occur. ? ?Please show the CHEMOTHERAPY ALERT CARD or IMMUNOTHERAPY ALERT CARD at check-in to the  Emergency Department and triage nurse. ? ?Should you have questions after your visit or need to cancel or reschedule your appointment, please contact [facility]  Dept: [phone]  and follow the prompts.  Office hours are 8:00 a.m. to 4:30 p.m. Monday - Friday. Please note that voicemails left after 4:00 p.m. may not be returned until the following business day.  We are closed weekends and major holidays. You have access to a nurse at all times for urgent questions. Please call the main number to the clinic Dept: [phone] and follow the prompts. ? ? ?For any non-urgent questions, you may also contact your provider using MyChart. We now offer e-Visits for anyone 18 and older to request care online for non-urgent symptoms. For details visit [email or web address]. ?  ?Also download the MyChart app! Go to the app store, search "MyChart", open the app, select [facility], and log in with your MyChart username and password. ? ?Due to Covid, a mask is required upon entering the hospital/clinic. If you do not have a mask, one will be given to you upon arrival. For doctor visits, patients may have 1 support person aged 18 or older with them. For treatment visits, patients cannot have anyone with them due to current Covid guidelines and our immunocompromised population.  ? ?

## 2020-09-17 NOTE — Telephone Encounter (Signed)
Scheduled appointment per 06/02 scheduled message. Patient is aware.

## 2020-09-17 NOTE — Patient Instructions (Signed)

## 2020-09-17 NOTE — Progress Notes (Signed)
Okay to treat with creat 1.56 per Dr. Alvy Bimler

## 2020-09-17 NOTE — Assessment & Plan Note (Signed)
She has poorly controlled hypertension Her primary care doctor has started her on different blood pressure medication recently I will defer to her primary care for further management

## 2020-09-17 NOTE — Assessment & Plan Note (Signed)
Recent renal function fluctuates up and down We discussed the importance of aggressive risk factor modification

## 2020-09-17 NOTE — Assessment & Plan Note (Signed)
Overall, her exam is benign CT imaging show persistent right axillary lymphadenopathy but overall stable in size She tolerated treatment very well without major side effects except hypertension We will continue treatment as scheduled I plan to space out her imaging study to every 6 months, her next imaging will be in December

## 2020-09-24 DIAGNOSIS — Z23 Encounter for immunization: Secondary | ICD-10-CM | POA: Diagnosis not present

## 2020-10-09 ENCOUNTER — Inpatient Hospital Stay: Payer: Medicare Other

## 2020-10-09 ENCOUNTER — Other Ambulatory Visit: Payer: Self-pay | Admitting: Hematology and Oncology

## 2020-10-09 ENCOUNTER — Other Ambulatory Visit: Payer: Self-pay

## 2020-10-09 ENCOUNTER — Encounter: Payer: Self-pay | Admitting: Hematology and Oncology

## 2020-10-09 ENCOUNTER — Inpatient Hospital Stay (HOSPITAL_BASED_OUTPATIENT_CLINIC_OR_DEPARTMENT_OTHER): Payer: Medicare Other | Admitting: Hematology and Oncology

## 2020-10-09 DIAGNOSIS — I10 Essential (primary) hypertension: Secondary | ICD-10-CM

## 2020-10-09 DIAGNOSIS — C541 Malignant neoplasm of endometrium: Secondary | ICD-10-CM

## 2020-10-09 DIAGNOSIS — N183 Chronic kidney disease, stage 3 unspecified: Secondary | ICD-10-CM | POA: Diagnosis not present

## 2020-10-09 DIAGNOSIS — I129 Hypertensive chronic kidney disease with stage 1 through stage 4 chronic kidney disease, or unspecified chronic kidney disease: Secondary | ICD-10-CM | POA: Diagnosis not present

## 2020-10-09 DIAGNOSIS — Z79899 Other long term (current) drug therapy: Secondary | ICD-10-CM | POA: Diagnosis not present

## 2020-10-09 DIAGNOSIS — Z5112 Encounter for antineoplastic immunotherapy: Secondary | ICD-10-CM | POA: Diagnosis not present

## 2020-10-09 DIAGNOSIS — Z7189 Other specified counseling: Secondary | ICD-10-CM

## 2020-10-09 DIAGNOSIS — E039 Hypothyroidism, unspecified: Secondary | ICD-10-CM

## 2020-10-09 DIAGNOSIS — C773 Secondary and unspecified malignant neoplasm of axilla and upper limb lymph nodes: Secondary | ICD-10-CM

## 2020-10-09 LAB — CBC WITH DIFFERENTIAL/PLATELET
Abs Immature Granulocytes: 0.01 10*3/uL (ref 0.00–0.07)
Basophils Absolute: 0 10*3/uL (ref 0.0–0.1)
Basophils Relative: 0 %
Eosinophils Absolute: 0 10*3/uL (ref 0.0–0.5)
Eosinophils Relative: 1 %
HCT: 39.3 % (ref 36.0–46.0)
Hemoglobin: 12.6 g/dL (ref 12.0–15.0)
Immature Granulocytes: 0 %
Lymphocytes Relative: 27 %
Lymphs Abs: 1.5 10*3/uL (ref 0.7–4.0)
MCH: 28.1 pg (ref 26.0–34.0)
MCHC: 32.1 g/dL (ref 30.0–36.0)
MCV: 87.7 fL (ref 80.0–100.0)
Monocytes Absolute: 0.2 10*3/uL (ref 0.1–1.0)
Monocytes Relative: 4 %
Neutro Abs: 3.7 10*3/uL (ref 1.7–7.7)
Neutrophils Relative %: 68 %
Platelets: 177 10*3/uL (ref 150–400)
RBC: 4.48 MIL/uL (ref 3.87–5.11)
RDW: 15.4 % (ref 11.5–15.5)
WBC: 5.5 10*3/uL (ref 4.0–10.5)
nRBC: 0 % (ref 0.0–0.2)

## 2020-10-09 LAB — COMPREHENSIVE METABOLIC PANEL
ALT: 18 U/L (ref 0–44)
AST: 18 U/L (ref 15–41)
Albumin: 3.3 g/dL — ABNORMAL LOW (ref 3.5–5.0)
Alkaline Phosphatase: 68 U/L (ref 38–126)
Anion gap: 10 (ref 5–15)
BUN: 24 mg/dL — ABNORMAL HIGH (ref 8–23)
CO2: 19 mmol/L — ABNORMAL LOW (ref 22–32)
Calcium: 9.3 mg/dL (ref 8.9–10.3)
Chloride: 112 mmol/L — ABNORMAL HIGH (ref 98–111)
Creatinine, Ser: 1.41 mg/dL — ABNORMAL HIGH (ref 0.44–1.00)
GFR, Estimated: 40 mL/min — ABNORMAL LOW (ref >60)
Glucose, Bld: 157 mg/dL — ABNORMAL HIGH (ref 70–99)
Potassium: 3.9 mmol/L (ref 3.5–5.1)
Sodium: 141 mmol/L (ref 135–145)
Total Bilirubin: 0.5 mg/dL (ref 0.3–1.2)
Total Protein: 6.5 g/dL (ref 6.5–8.1)

## 2020-10-09 LAB — TOTAL PROTEIN, URINE DIPSTICK: Protein, ur: NEGATIVE mg/dL

## 2020-10-09 LAB — TSH: TSH: 0.717 u[IU]/mL (ref 0.308–3.960)

## 2020-10-09 MED ORDER — SODIUM CHLORIDE 0.9 % IV SOLN
Freq: Once | INTRAVENOUS | Status: AC
  Filled 2020-10-09: qty 250

## 2020-10-09 MED ORDER — SODIUM CHLORIDE 0.9 % IV SOLN
200.0000 mg | Freq: Once | INTRAVENOUS | Status: AC
  Administered 2020-10-09: 200 mg via INTRAVENOUS
  Filled 2020-10-09: qty 8

## 2020-10-09 MED ORDER — SODIUM CHLORIDE 0.9% FLUSH
10.0000 mL | Freq: Once | INTRAVENOUS | Status: AC
Start: 2020-10-09 — End: 2020-10-09
  Administered 2020-10-09: 10 mL
  Filled 2020-10-09: qty 10

## 2020-10-09 MED ORDER — HEPARIN SOD (PORK) LOCK FLUSH 100 UNIT/ML IV SOLN
500.0000 [IU] | Freq: Once | INTRAVENOUS | Status: AC | PRN
  Administered 2020-10-09: 500 [IU]
  Filled 2020-10-09: qty 5

## 2020-10-09 MED ORDER — SODIUM CHLORIDE 0.9% FLUSH
10.0000 mL | INTRAVENOUS | Status: DC | PRN
  Administered 2020-10-09: 10 mL
  Filled 2020-10-09: qty 10

## 2020-10-09 NOTE — Assessment & Plan Note (Signed)
She has poorly controlled hypertension Her primary care doctor has started her on different blood pressure medication recently I will defer to her primary care for further management

## 2020-10-09 NOTE — Progress Notes (Signed)
[place] OFFICE PROGRESS NOTE  Patient Care Team: Jonathon Jordan, MD as PCP - General (Family Medicine) Awanda Mink Craige Cotta, RN as Registered Nurse (Oncology)  ASSESSMENT & PLAN:  Endometrial cancer [facility]) Overall, her exam is benign CT imaging show persistent right axillary lymphadenopathy but overall stable in size She tolerated treatment very well without major side effects except hypertension We will continue treatment as scheduled I plan to space out her imaging study to every 6 months, her next imaging will be in December  Chronic kidney disease (CKD), stage III (moderate) ([place]) Recent renal function fluctuates up and down We discussed the importance of aggressive risk factor modification  Essential hypertension She has poorly controlled hypertension Her primary care doctor has started her on different blood pressure medication recently I will defer to her primary care for further management  No orders of the defined types were placed in this encounter.   All questions were answered. The patient knows to call the clinic with any problems, questions or concerns. The total time spent in the appointment was 20 minutes encounter with patients including review of chart and various tests results, discussions about plan of care and coordination of care plan   [name], MD 10/09/2020 1:32 PM  INTERVAL HISTORY: Please see below for problem oriented charting. 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 Leo Grosser who obtained an endometrial biopsy showing poorly differentiated endometrial carcinoma and negative endocervical curettage    12/12/2017 Imaging   MAMMOGRAM FINDINGS: In the right axilla, a possible mass warrants further evaluation. In the left breast, no findings suspicious for malignancy.   Images were processed with CAD.   IMPRESSION: Further evaluation is suggested for possible mass in the right axilla.      12/24/2017 Imaging   Ct scan chest, abdomen and pelvis 1. Marked thickening of the endometrium (42 mm) compatible with known primary endometrial malignancy. No evidence of extrauterine invasion. 2. No pelvic or retroperitoneal adenopathy. 3. Right middle lobe 4 mm solid pulmonary nodule, for which follow-up chest CT is advised in 3-6 months. 4. Several findings that are equivocal for distant metastatic disease. Vaguely nodular heterogeneous hyperenhancement in the peripheral right liver lobe, which could represent benign transient perfusional phenomena, with underlying liver lesions not entirely excluded. Mildly sclerotic T12 vertebral lesion. Mildly enlarged right axillary lymph node. The best single test to further evaluate these findings would be a PET-CT. Alternative tests include bone scan or thoracic MRI without and with IV contrast for the T12 osseous lesion, MRI abdomen without and with IV contrast for the liver findings, and diagnostic mammographic evaluation for the right axillary node. 5.  Aortic Atherosclerosis (ICD10-I70.0).      01/09/2018 PET scan   1. Moderate hypermetabolism corresponding to enlarging axillary node since 12/22/2017. Highly suspicious for an atypical distribution of metastatic disease. 2. No hypermetabolism to suggest hepatic or T12 osseous metastasis. 3. Hypermetabolic endometrial primary.    01/23/2018 Initial Diagnosis   Endometrial cancer ([place])    01/23/2018 Pathology Results   1.  Lymph node, sentinel, biopsy, left external  iliac - NO CARCINOMA IDENTIFIED IN ONE LYMPH NODE (0/1) - SEE COMMENT 2. Lymph nodes, regional resection, right para aortic - NO CARCINOMA IDENTIFIED IN FOUR LYMPH NODES (0/4) - SEE COMMENT 3. Lymph nodes, regional resection, right pelvic - NO CARCINOMA IDENTIFIED IN EIGHT LYMPH NODES (0/8) - SEE COMMENT 4. Cul-de-sac biopsy - METASTATIC CARCINOMA 5. Uterus +/- tubes/ovaries, neoplastic, cervix, bilateral fallopian tubes and ovaries UTERUS: - MIXED SEROUS AND ENDOMETRIOID CARCINOMA - SEROSAL IMPLANTS PRESENT - LYMPHOVASCULAR SPACE INVASION PRESENT - LEIOMYOMATA (1.5 CM; LARGEST) - SEE ONCOLOGY TABLE AND COMMENT BELOW CERVIX: - BENIGN NABOTHIAN CYSTS - NO CARCINOMA IDENTIFIED BILATERAL OVARIES: - METASTATIC CARCINOMA PRESENT ON OVARIAN SURFACE BILATERAL FALLOPIAN TUBES: - INTRALUMINAL CARCINOMA Microscopic Comment 1. -3. Cytokeratin AE1/3 was performed on the sentinel lymph nodes to exclude micrometastasis. There is no evidence of metastatic carcinoma by immunohistochemistry. 5. 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%) Histologic Grade: N/A Myometrial invasion: Estimated less than 50% myometrial invasion (0.3 cm of myometrium involved; 1.4 cm measured thickness) Uterine Serosa Involvement: Present Cervical stromal involvement: Not identified Extent of involvement of other organs: - Fallopian tube (left within the lumen) - Ovary, left (surface involvement) - Cul-de-sac Lymphovascular invasion: Present Regional Lymph Nodes: Examined: 1 Sentinel 12 Non-sentinel 13 Total Lymph nodes with metastasis: 0 Isolated tumor cells (< 0.2 mm): 0 Micrometastasis: (> 0.2 mm and < 2.0 mm): 0 Macrometastasis: (> 2.0 mm): 0 Extracapsular extension:  N/A Tumor block for ancillary studies: 5E, 5B MMR / MSI testing: Pending will be reported separately Pathologic Stage Classification (pTNM, AJCC 8th edition): pT3a, pN0 FIGO Stage: IIIA COMMENT: There is tumor present on the surface of the left ovary and within the lumen of the left fallopian tube. The carcinoma appears to be mixed with the largest component being high grade serous carcinoma. Dr. Lyndon Code reviewed the case and agrees with the above diagnosis.    01/23/2018 Surgery   Surgeon: Donaciano Eva     Operation: Robotic-assisted laparoscopic total hysterectomy with bilateral salpingoophorectomy, SLN injection, mapping and biopsy, right pelvic and para-aortic lymphadenectomy   Operative Findings:  : 10-12cm bulky uterus with frank serosal involvement on posterior cul de sac peritoneum and anterior peritoneum which adhesed the bladder to the anterior uterus. Unilateral mapping on left pelvis. No grossly suspicious nodes. Normal omentum and diaphragms.     02/01/2018 Pathology Results   Lymph node, needle/core biopsy, right axilla - METASTATIC CARCINOMA - SEE COMMENT Microscopic Comment The neoplastic cells are positive for cytokeratin 7 and Pax-8 but negative for cytokeratin 5/6, cytokeratin 20, Gata-3, p63, p53 and GCDFP. Overall, the immunoprofile is consistent with metastasis from the patient's known gynecologic carcinoma.    02/01/2018 Procedure   Ultrasound-guided core biopsies of a suspicious right axillary lymph node.    02/21/2018 - 06/13/2018 Chemotherapy   The patient had carboplatin & Taxol x 6    03/26/2018 - 04/30/2018 Radiation Therapy   Radiation treatment dates:   03/26/18, 04/02/18, 04/19/18, 04/23/18 04/30/18   Site/dose: proximal Vagina, 6 Gy in 5 fractions for a total dose of 30 Gy      04/26/2018 PET scan   1. Interval resection of the hypermetabolic endometrial primary. No discernible hypermetabolic pelvic sidewall or peritoneal metastases. 2. Stable  hypermetabolic bilateral axillary lymphadenopathy. The degree of hypermetabolism in these lymph nodes  remains highly suspicious for neoplasm. 3. Interval development of low level FDG uptake in 2 stable, small lymph nodes in the left groin region. This may be reactive, but close attention recommended to exclude metastatic involvement. 4. Stable non hypermetabolic low-density liver lesions and the mixed lucent and sclerotic T12 lesion is stable without hypermetabolism today.    07/09/2018 Imaging   Status post hysterectomy.   Mild axillary lymphadenopathy, improved. Nodal metastases not excluded.   Irregular wall thickening involving the anterior bladder, possibly reflecting radiation cystitis versus tumor.   Additional stable findings as above, including a 5 mm right middle lobe nodule and stable sclerosis involving the T12 vertebral body.    10/10/2018 Imaging   1. Stable exam. No findings within the abdomen or pelvis to suggest metastatic disease. 2. Unchanged appearance of sclerosis involving the T12 vertebra. 3.  Aortic Atherosclerosis (ICD10-I70.0).   10/10/2018 Imaging   Ct abdomen and pelvis 1. Stable exam. No findings within the abdomen or pelvis to suggest metastatic disease. 2. Unchanged appearance of sclerosis involving the T12 vertebra. 3.  Aortic Atherosclerosis (ICD10-I70.0).   01/16/2019 PET scan   1. Enlargement and increased activity in the left axillary, right axillary, and left inguinal adenopathy compatible with progressive malignancy. 2. Stable 4 mm right middle lobe subpleural nodule, not appreciably hypermetabolic. 3. Other imaging findings of potential clinical significance: Right kidney lower pole cyst. Aortic Atherosclerosis (ICD10-I70.0). Prominent stool throughout the colon favors constipation. Mild chronic left maxillary sinusitis.     02/01/2019 -  Chemotherapy   The patient had pembrolizumab and Lenvima for chemotherapy treatment.     04/25/2019 Imaging    Ct imaging 1. Interval response to therapy. Decrease in size of bilateral axillary and left inguinal lymph nodes. No new or progressive findings identified. 2. Stable sclerotic appearance of the T12 vertebra. 3. Aortic atherosclerosis. Lad coronary artery calcification noted.   08/30/2019 Imaging   1. Bulky RIGHT hilar lymph node, slightly increased in size from previous imaging. 2. Multiple foci of hyperenhancement in the liver. 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 Consider MRI liver on follow-up to establish a baseline for number of lesions as these will appear variable on CT follow-up based on phase of contrast acquisition in the future. 3. Stable 5 mm nodule adjacent to the fissure, minor fissure in the RIGHT middle lobe.     11/28/2019 Imaging   1. Stable exam. No new or progressive findings. 2. Stable enlarged right axillary lymph node. 3. Stable tiny bilateral pulmonary nodules. Continued attention on follow-up recommended. 4. Scattered hypodensities in the liver parenchyma correspond to the hypervascular lesions seen on the previous study performed with intravenous contrast material. 5. Right renal cyst. 6. Aortic Atherosclerosis (ICD10-I70.0).     03/16/2020 Imaging   Increased echogenicity of renal parenchyma is noted bilaterally suggesting medical renal disease. No hydronephrosis or renal obstruction is noted   04/02/2020 Imaging   1. Right axillary lymphadenopathy is stable. Tiny bilateral pulmonary nodules are stable. Subcentimeter low-attenuation liver lesions are stable. 2. No new or progressive metastatic disease in the chest, abdomen or pelvis. 3. Aortic Atherosclerosis (ICD10-I70.0).   09/15/2020 Imaging   1. Stable RIGHT axillary lymph node with enlargement measuring  2 cm short axis. 2. Stable small nodule along the minor fissure in the RIGHT  chest. 3. No new or progressive findings. 4. Aortic atherosclerosis.     REVIEW OF SYSTEMS:   Constitutional: Denies fevers, chills or abnormal weight loss Eyes: Denies blurriness of vision Ears, nose, mouth, throat, and face: Denies mucositis or sore throat Respiratory: Denies cough, dyspnea or wheezes Cardiovascular: Denies palpitation, chest discomfort or lower extremity swelling Gastrointestinal:  Denies nausea, heartburn or change in bowel habits Skin: Denies abnormal skin rashes Lymphatics: Denies new lymphadenopathy or easy bruising Neurological:Denies numbness, tingling or new weaknesses Behavioral/Psych: Mood is stable, no new changes  All other systems were reviewed with the patient and are negative.  I have reviewed the past medical history, past surgical history, social history and family history with the patient and they are unchanged from previous note.  ALLERGIES:  is allergic to sulfa antibiotics and sulfamethoxazole.  MEDICATIONS:  Current Outpatient Medications  Medication Sig Dispense Refill   amLODipine-valsartan (EXFORGE) 5-160 MG tablet Take 1 tablet by mouth daily.     Calcium Carb-Cholecalciferol 600-800 MG-UNIT TABS Take 1 tablet by mouth daily.     ergocalciferol (VITAMIN D2) 1.25 MG (50000 UT) capsule Take 1 capsule (50,000 Units total) by mouth once a week. 12 capsule 1   insulin isophane & regular human (NOVOLIN 70/30 FLEXPEN) (70-30) 100 UNIT/ML KwikPen Inject 14 Units into the skin daily with breakfast. 15 mL 3   lenvatinib 10 mg daily dose (LENVIMA, 10 MG DAILY DOSE,) capsule Take 1 capsule (10 mg total) by mouth daily. 30 capsule 11   levothyroxine (SYNTHROID) 150 MCG tablet Take 1 tablet (150 mcg total) by mouth daily before breakfast. 30 tablet 3   lidocaine-prilocaine (EMLA) cream Apply topically daily as needed. 30 g 3   OneTouch Delica Lancets 25K MISC 1 each by Other  route 2 (two) times daily. Use to monitor glucose levels BID; E11.65 100 each 2   ONETOUCH VERIO test strip 1 each by Other route 3 (three) times daily. And lancets 3/day 300 each 3   RELION PEN NEEDLES 32G X 4 MM MISC USE 1 PEN PER DAY 50 each 5   simvastatin (ZOCOR) 10 MG tablet Take 10 mg by mouth at bedtime.      No current facility-administered medications for this visit.   Facility-Administered Medications Ordered in Other Visits  Medication Dose Route Frequency Provider Last Rate Last Admin   sodium chloride flush (NS) 0.9 % injection 10 mL  10 mL Intracatheter PRN Alvy Bimler, Daryn Pisani, MD   10 mL at 10/09/20 1036    PHYSICAL EXAMINATION: ECOG PERFORMANCE STATUS: 1 - Symptomatic but completely ambulatory  Vitals:   10/09/20 0845  BP: (!) 190/101  Pulse: 89  Resp: 18  Temp: 97.8 F (36.6 C)  SpO2: 100%   Filed Weights   10/09/20 0845  Weight: 165 lb 9.6 oz (75.1 kg)    GENERAL:alert, no distress and comfortable SKIN: skin color, texture, turgor are normal, no rashes or significant lesions EYES: normal, Conjunctiva are pink and non-injected, sclera clear OROPHARYNX:no exudate, no erythema and lips, buccal mucosa, and tongue normal  NECK: supple, thyroid normal size, non-tender, without nodularity LYMPH:  no palpable lymphadenopathy in the cervical, axillary or inguinal LUNGS: clear to auscultation and percussion with normal breathing effort HEART: regular rate & rhythm and no murmurs and no lower extremity edema ABDOMEN:abdomen soft, non-tender and normal bowel sounds Musculoskeletal:no cyanosis of digits and no clubbing  NEURO: alert & oriented x 3 with fluent speech, no focal motor/sensory deficits  LABORATORY DATA:  I have reviewed  the data as listed    Component Value Date/Time   NA 141 10/09/2020 0833   K 3.9 10/09/2020 0833   CL 112 (H) 10/09/2020 0833   CO2 19 (L) 10/09/2020 0833   GLUCOSE 157 (H) 10/09/2020 0833   BUN 24 (H) 10/09/2020 0833   CREATININE 1.41 (H)  10/09/2020 0833   CREATININE 1.75 (H) 01/10/2020 1200   CALCIUM 9.3 10/09/2020 0833   PROT 6.5 10/09/2020 0833   ALBUMIN 3.3 (L) 10/09/2020 0833   AST 18 10/09/2020 0833   AST 13 (L) 01/10/2020 1200   ALT 18 10/09/2020 0833   ALT 11 01/10/2020 1200   ALKPHOS 68 10/09/2020 0833   BILITOT 0.5 10/09/2020 0833   BILITOT 0.4 01/10/2020 1200   GFRNONAA 40 (L) 10/09/2020 0833   GFRNONAA 29 (L) 01/10/2020 1200   GFRAA 34 (L) 01/10/2020 1200    No results found for: SPEP, UPEP  Lab Results  Component Value Date   WBC 5.5 10/09/2020   NEUTROABS 3.7 10/09/2020   HGB 12.6 10/09/2020   HCT 39.3 10/09/2020   MCV 87.7 10/09/2020   PLT 177 10/09/2020      Chemistry      Component Value Date/Time   NA 141 10/09/2020 0833   K 3.9 10/09/2020 0833   CL 112 (H) 10/09/2020 0833   CO2 19 (L) 10/09/2020 0833   BUN 24 (H) 10/09/2020 0833   CREATININE 1.41 (H) 10/09/2020 0833   CREATININE 1.75 (H) 01/10/2020 1200      Component Value Date/Time   CALCIUM 9.3 10/09/2020 0833   ALKPHOS 68 10/09/2020 0833   AST 18 10/09/2020 0833   AST 13 (L) 01/10/2020 1200   ALT 18 10/09/2020 0833   ALT 11 01/10/2020 1200   BILITOT 0.5 10/09/2020 0833   BILITOT 0.4 01/10/2020 1200

## 2020-10-09 NOTE — Assessment & Plan Note (Signed)
Overall, her exam is benign CT imaging show persistent right axillary lymphadenopathy but overall stable in size She tolerated treatment very well without major side effects except hypertension We will continue treatment as scheduled I plan to space out her imaging study to every 6 months, her next imaging will be in December

## 2020-10-09 NOTE — Assessment & Plan Note (Signed)
Recent renal function fluctuates up and down We discussed the importance of aggressive risk factor modification

## 2020-10-09 NOTE — Patient Instructions (Signed)
[facility] MEDICAL ONCOLOGY  Discharge Instructions: ?Thank you for choosing [facility] to provide your oncology and hematology care.  ? ?If you have a lab appointment with the Cancer Center, please go directly to the Cancer Center and check in at the registration area. ?  ?Wear comfortable clothing and clothing appropriate for easy access to any Portacath or PICC line.  ? ?We strive to give you quality time with your provider. You may need to reschedule your appointment if you arrive late (15 or more minutes).  Arriving late affects you and other patients whose appointments are after yours.  Also, if you miss three or more appointments without notifying the office, you may be dismissed from the clinic at the provider?s discretion.    ?  ?For prescription refill requests, have your pharmacy contact our office and allow 72 hours for refills to be completed.   ? ?Today you received the following chemotherapy and/or immunotherapy agents: Keytruda ?  ?To help prevent nausea and vomiting after your treatment, we encourage you to take your nausea medication as directed. ? ?BELOW ARE SYMPTOMS THAT SHOULD BE REPORTED IMMEDIATELY: ?*FEVER GREATER THAN 100.4 F (38 ?C) OR HIGHER ?*CHILLS OR SWEATING ?*NAUSEA AND VOMITING THAT IS NOT CONTROLLED WITH YOUR NAUSEA MEDICATION ?*UNUSUAL SHORTNESS OF BREATH ?*UNUSUAL BRUISING OR BLEEDING ?*URINARY PROBLEMS (pain or burning when urinating, or frequent urination) ?*BOWEL PROBLEMS (unusual diarrhea, constipation, pain near the anus) ?TENDERNESS IN MOUTH AND THROAT WITH OR WITHOUT PRESENCE OF ULCERS (sore throat, sores in mouth, or a toothache) ?UNUSUAL RASH, SWELLING OR PAIN  ?UNUSUAL VAGINAL DISCHARGE OR ITCHING  ? ?Items with * indicate a potential emergency and should be followed up as soon as possible or go to the Emergency Department if any problems should occur. ? ?Please show the CHEMOTHERAPY ALERT CARD or IMMUNOTHERAPY ALERT CARD at check-in to the  Emergency Department and triage nurse. ? ?Should you have questions after your visit or need to cancel or reschedule your appointment, please contact [facility]ICAL [facility]COL[facility]  Dept: [phone]  and follow the prompts.  Office hours are 8:00 a.m. to 4:30 p.m. Monday - Friday. Please note that voicemails left after 4:00 p.m. may not be returned until the following business day.  We are closed weekends and major holidays. You have access to a nurse at all times for urgent questions. Please call the main number to the clinic Dept: [phone] and follow the prompts. ? ? ?For any non-urgent questions, you may also contact your provider using MyChart. We now offer e-Visits for anyone 18 and older to request care online for non-urgent symptoms. For details visit [email or web address]. ?  ?Also download the MyChart app! Go to the app store, search "MyChart", open the app, select [facility], and log in with your MyChart username and password. ? ?Due to Covid, a mask is required upon entering the hospital/clinic. If you do not have a mask, one will be given to you upon arrival. For doctor visits, patients may have 1 support person aged 18 or older with them. For treatment visits, patients cannot have anyone with them due to current Covid guidelines and our immunocompromised population.  ? ?

## 2020-10-12 ENCOUNTER — Other Ambulatory Visit: Payer: Self-pay

## 2020-10-12 ENCOUNTER — Ambulatory Visit (INDEPENDENT_AMBULATORY_CARE_PROVIDER_SITE_OTHER): Payer: Medicare Other | Admitting: Podiatry

## 2020-10-12 DIAGNOSIS — N183 Chronic kidney disease, stage 3 unspecified: Secondary | ICD-10-CM

## 2020-10-12 DIAGNOSIS — Z794 Long term (current) use of insulin: Secondary | ICD-10-CM

## 2020-10-12 DIAGNOSIS — B351 Tinea unguium: Secondary | ICD-10-CM

## 2020-10-12 DIAGNOSIS — E0822 Diabetes mellitus due to underlying condition with diabetic chronic kidney disease: Secondary | ICD-10-CM | POA: Diagnosis not present

## 2020-10-12 DIAGNOSIS — M79674 Pain in right toe(s): Secondary | ICD-10-CM | POA: Diagnosis not present

## 2020-10-12 DIAGNOSIS — M79675 Pain in left toe(s): Secondary | ICD-10-CM

## 2020-10-12 DIAGNOSIS — Q828 Other specified congenital malformations of skin: Secondary | ICD-10-CM

## 2020-10-12 DIAGNOSIS — L84 Corns and callosities: Secondary | ICD-10-CM

## 2020-10-13 ENCOUNTER — Encounter: Payer: Self-pay | Admitting: Podiatry

## 2020-10-13 NOTE — Progress Notes (Signed)
  Subjective:  Patient ID: Krista Johnson, female    DOB: 10/17/1950,  MRN: [id number]  RASHEDA LEDGER presents to clinic today for at risk foot care. Pt has h/o NIDDM with chronic kidney disease and painful porokeratotic lesion(s) b/l feet and painful mycotic toenails that limit ambulation. Painful toenails interfere with ambulation. Aggravating factors include wearing enclosed shoe gear. Pain is relieved with periodic professional debridement. Painful porokeratotic lesions are aggravated when weightbearing with and without shoegear. Pain is relieved with periodic professional debridement.  Patient states blood glucose was 138 mg/dl today.  PCP is Jonathon Jordan, MD , and last visit was 09/15/2020.  Allergies  Allergen Reactions   Sulfa Antibiotics Nausea Only   Sulfamethoxazole Nausea Only    Review of Systems: Negative except as noted in the HPI. Objective:   Constitutional PHILIPPA VESSEY is a pleasant 70 y.o. African American female, WD, WN in NAD. AAO x 3.   Vascular Capillary refill time to digits immediate b/l. Palpable DP pulse(s) b/l lower extremities Palpable PT pulse(s) b/l lower extremities Pedal hair sparse. Lower extremity skin temperature gradient within normal limits. No pain with calf compression b/l. No edema noted b/l lower extremities. No cyanosis or clubbing noted.  Neurologic Normal speech. Oriented to person, place, and time. Pt has subjective symptoms of neuropathy. Protective sensation intact 5/5 intact bilaterally with 10g monofilament b/l. Vibratory sensation intact b/l.  Dermatologic Pedal skin with normal turgor, texture and tone b/l lower extremities No open wounds b/l lower extremities No interdigital macerations b/l lower extremities Toenails 1-5 b/l elongated, discolored, dystrophic, thickened, crumbly with subungual debris and tenderness to dorsal palpation. Hyperkeratotic lesion(s) L 5th toe and R 5th toe.  No erythema, no edema, no drainage, no fluctuance.  Porokeratotic lesion(s) submet head 4 left foot and submet head 5 right foot. No erythema, no edema, no drainage, no fluctuance.  Orthopedic: Normal muscle strength 5/5 to all lower extremity muscle groups bilaterally. No pain crepitus or joint limitation noted with ROM b/l. Hammertoe(s) noted to the L 5th toe and R 5th toe.   Radiographs: None Assessment:   1. Pain due to onychomycosis of toenails of both feet   2. Porokeratosis   3. Corns   4. 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  Onychomycosis with pain -Nails palliatively debridement as below -Educated on self-care  Procedure: Nail Debridement Rationale: Pain Type of Debridement: manual, sharp debridement. Instrumentation: Nail nipper, rotary burr. Number of Nails: 10 -Examined patient. -Continue diabetic foot care principles. -Patient to continue soft, supportive shoe gear daily. -Toenails 1-5 b/l were debrided in length and girth with sterile nail nippers and dremel without iatrogenic bleeding.  -Corn(s) L 5th toe and R 5th toe pared utilizing sterile scalpel blade without complication or incident. Total number debrided=2. -Painful porokeratotic lesion(s) submet head 4 left foot and submet head 5 right foot pared and enucleated with sterile scalpel blade without incident. Total number of lesions debrided=2. -Patient to report any pedal injuries to medical professional immediately. -Patient/POA to call should there be question/concern in the interim.  Return in about 3 months (around 01/12/2021).  Marzetta Board, DPM

## 2020-10-16 ENCOUNTER — Encounter: Payer: Self-pay | Admitting: Hematology and Oncology

## 2020-10-20 ENCOUNTER — Other Ambulatory Visit: Payer: Self-pay

## 2020-10-20 ENCOUNTER — Ambulatory Visit (INDEPENDENT_AMBULATORY_CARE_PROVIDER_SITE_OTHER): Payer: Medicare Other | Admitting: Endocrinology

## 2020-10-20 VITALS — BP 150/80 | HR 93 | Ht 66.0 in | Wt 160.8 lb

## 2020-10-20 DIAGNOSIS — N1831 Chronic kidney disease, stage 3a: Secondary | ICD-10-CM

## 2020-10-20 DIAGNOSIS — E1122 Type 2 diabetes mellitus with diabetic chronic kidney disease: Secondary | ICD-10-CM | POA: Diagnosis not present

## 2020-10-20 DIAGNOSIS — Z794 Long term (current) use of insulin: Secondary | ICD-10-CM | POA: Diagnosis not present

## 2020-10-20 LAB — POCT GLYCOSYLATED HEMOGLOBIN (HGB A1C): Hemoglobin A1C: 6.6 % — AB (ref 4.0–5.6)

## 2020-10-20 NOTE — Progress Notes (Signed)
Subjective:    Patient ID: Krista Johnson, female    DOB: 15-May-1950, 70 y.o.   MRN: [id number]  HPI Pt returns for f/u of diabetes mellitus:  DM type: 2   Dx'ed: 4098 Complications: stage 3 CRI Therapy: no medication now GDM: never (G0) DKA: never Severe hypoglycemia: never Pancreatitis: never Pancreatic imaging: normal on 2019 CT.   Other: she took insulin 2019-2022; She is finished with her IV chemo; fructosamine has confirmed A1c.   Interval history: no cbg record, but states cbg's vary from 120-124.     Pt also has vit-D def and Hyperparathyroidism (prob a combination of primary and secondary).  She takes vit-D, 50000 units/week  No recent steroids.   Past Medical History:  Diagnosis Date   Arthritis    right knee--- last cortisone injection 04/ 2019   CKD (chronic kidney disease)    Diabetes mellitus without complication [facility])    endocrinologist-  dr Loanne Drilling   Endometrial cancer [facility])    History of colon polyps    Hyperlipidemia    Hyperparathyroidism ([place])    Hypertension     Past Surgical History:  Procedure Laterality Date   COLONOSCOPY  last one 12-25-2017   IR IMAGING GUIDED PORT INSERTION  02/20/2018   ROBOTIC ASSISTED TOTAL HYSTERECTOMY WITH BILATERAL SALPINGO OOPHERECTOMY N/A 01/23/2018   Procedure: XI ROBOTIC ASSISTED TOTAL HYSTERECTOMY WITH BILATERAL SALPINGO OOPHORECTOMY;  Surgeon: Everitt Amber, MD;  Location: [facility];  Service: Gynecology;  Laterality: N/A;   SENTINEL NODE BIOPSY N/A 01/23/2018   Procedure: SENTINEL NODE BIOPSY;  Surgeon: Everitt Amber, MD;  Location: [facility];  Service: Gynecology;  Laterality: N/A;    Social History   Socioeconomic History   Marital status: Single    Spouse name: Not on file   Number of children: 0   Years of education: Not on file   Highest education level: Not on file  Occupational History   Occupation: retired Education officer, [employer]  Tobacco Use   Smoking status: Never   Smokeless tobacco: Never  Vaping Use   Vaping Use:  Never used  Substance and Sexual Activity   Alcohol use: Not Currently    Alcohol/week: 0.0 standard drinks   Drug use: Never   Sexual activity: Not on file  Other Topics Concern   Not on file  Social History Narrative   Not on file   Social Determinants of Health   Financial Resource Strain: Not on file  Food Insecurity: Not on file  Transportation Needs: Not on file  Physical Activity: Not on file  Stress: Not on file  Social Connections: Not on file  Intimate Partner Violence: Not on file    Current Outpatient Medications on File Prior to Visit  Medication Sig Dispense Refill   amLODipine-valsartan (EXFORGE) 5-160 MG tablet Take 1 tablet by mouth daily.     Calcium Carb-Cholecalciferol 600-800 MG-UNIT TABS Take 1 tablet by mouth daily.     calcium carbonate (OSCAL) 1500 (600 Ca) MG TABS tablet 1 tablet     Cholecalciferol 50 MCG (2000 UT) CAPS 2 capsules     ergocalciferol (VITAMIN D2) 1.25 MG (50000 UT) capsule Take 1 capsule (50,000 Units total) by mouth once a week. 12 capsule 1   lenvatinib 10 mg daily dose (LENVIMA, 10 MG DAILY DOSE,) capsule Take 1 capsule (10 mg total) by mouth daily. 30 capsule 11   levothyroxine (SYNTHROID) 150 MCG tablet Take 1 tablet (150 mcg total) by mouth daily before breakfast. 30 tablet 3  lidocaine-prilocaine (EMLA) cream Apply topically daily as needed. 30 g 3   OneTouch Delica Lancets 62H MISC 1 each by Other route 2 (two) times daily. Use to monitor glucose levels BID; E11.65 100 each 2   ONETOUCH VERIO test strip 1 each by Other route 3 (three) times daily. And lancets 3/day 300 each 3   RELION PEN NEEDLES 32G X 4 MM MISC USE 1 PEN PER DAY 50 each 5   simvastatin (ZOCOR) 10 MG tablet Take 10 mg by mouth at bedtime.      No current facility-administered medications on file prior to visit.    Allergies  Allergen Reactions   Sulfa Antibiotics Nausea Only   Sulfamethoxazole Nausea Only    Family History  Problem Relation Age of  Onset   Diabetes Mother    Hypertension Mother    Diabetes Sister    Hypertension Sister    Diabetes Maternal Uncle    Colon cancer Paternal Aunt    Diabetes Paternal Aunt    Stomach cancer Neg Hx    Rectal cancer Neg Hx    Esophageal cancer Neg Hx    Colon polyps Neg Hx     BP (!) 150/80 (BP Location: Right Arm, Patient Position: Sitting, Cuff Size: Normal)   Pulse 93   Ht 5\' 6"  (1.676 m)   Wt 160 lb 12.8 oz (72.9 kg)   SpO2 98%   BMI 25.95 kg/m    Review of Systems     Objective:   Physical Exam Pulses: dorsalis pedis intact bilat.   MSK: no deformity of the feet CV: no leg edema Skin:  no ulcer on the feet.  normal temp on the feet.   Neuro: sensation is intact to touch on the feet.     Lab Results  Component Value Date   HGBA1C 6.6 (A) 10/20/2020   Lab Results  Component Value Date   CREATININE 1.41 (H) 10/09/2020   BUN 24 (H) 10/09/2020   NA 141 10/09/2020   K 3.9 10/09/2020   CL 112 (H) 10/09/2020   CO2 19 (L) 10/09/2020   Lab Results  Component Value Date   PTH 432 (H) 07/22/2020   CALCIUM 9.3 10/09/2020   CAION 1.28 12/12/2017       Assessment & Plan:  Type 2 DM: stable.  We discussed.  Pt declines medication for now.   Hyperparathyroidism: She declines to recheck PTH and Vit-d today, as Ca++ was recently normal.   Patient Instructions  Your blood pressure is high today.  Please see your primary care provider soon, to have it rechecked.   check your blood sugar 3 times a day.  vary the time of day when you check, between before the 3 meals, and at bedtime.  also check if you have symptoms of your blood sugar being too high or too low.  please keep a record of the readings and bring it to your next appointment here (or you can bring the meter itself).  You can write it on any piece of paper.  please call us sooner if your blood sugar goes below 70, or if you have a lot of readings over 200.    Please continue the same Vitamin-D.   Please come  back for a follow-up appointment in 3 months.

## 2020-10-20 NOTE — Patient Instructions (Addendum)
Your blood pressure is high today.  Please see your primary care provider soon, to have it rechecked.   check your blood sugar 3 times a day.  vary the time of day when you check, between before the 3 meals, and at bedtime.  also check if you have symptoms of your blood sugar being too high or too low.  please keep a record of the readings and bring it to your next appointment here (or you can bring the meter itself).  You can write it on any piece of paper.  please call us sooner if your blood sugar goes below 70, or if you have a lot of readings over 200.    Please continue the same Vitamin-D.   Please come back for a follow-up appointment in 3 months.

## 2020-10-30 ENCOUNTER — Inpatient Hospital Stay: Payer: Medicare Other | Attending: Hematology and Oncology

## 2020-10-30 ENCOUNTER — Inpatient Hospital Stay: Payer: Medicare Other

## 2020-10-30 ENCOUNTER — Other Ambulatory Visit: Payer: Self-pay

## 2020-10-30 ENCOUNTER — Inpatient Hospital Stay (HOSPITAL_BASED_OUTPATIENT_CLINIC_OR_DEPARTMENT_OTHER): Payer: Medicare Other | Admitting: Hematology and Oncology

## 2020-10-30 ENCOUNTER — Encounter: Payer: Self-pay | Admitting: Hematology and Oncology

## 2020-10-30 VITALS — Temp 98.2°F

## 2020-10-30 DIAGNOSIS — C541 Malignant neoplasm of endometrium: Secondary | ICD-10-CM

## 2020-10-30 DIAGNOSIS — I10 Essential (primary) hypertension: Secondary | ICD-10-CM

## 2020-10-30 DIAGNOSIS — N183 Chronic kidney disease, stage 3 unspecified: Secondary | ICD-10-CM | POA: Diagnosis not present

## 2020-10-30 DIAGNOSIS — Z79899 Other long term (current) drug therapy: Secondary | ICD-10-CM | POA: Insufficient documentation

## 2020-10-30 DIAGNOSIS — C773 Secondary and unspecified malignant neoplasm of axilla and upper limb lymph nodes: Secondary | ICD-10-CM

## 2020-10-30 DIAGNOSIS — E039 Hypothyroidism, unspecified: Secondary | ICD-10-CM

## 2020-10-30 DIAGNOSIS — Z5112 Encounter for antineoplastic immunotherapy: Secondary | ICD-10-CM | POA: Insufficient documentation

## 2020-10-30 DIAGNOSIS — Z7189 Other specified counseling: Secondary | ICD-10-CM

## 2020-10-30 LAB — CBC WITH DIFFERENTIAL/PLATELET
Abs Immature Granulocytes: 0.01 10*3/uL (ref 0.00–0.07)
Basophils Absolute: 0 10*3/uL (ref 0.0–0.1)
Basophils Relative: 0 %
Eosinophils Absolute: 0 10*3/uL (ref 0.0–0.5)
Eosinophils Relative: 1 %
HCT: 39.7 % (ref 36.0–46.0)
Hemoglobin: 12.7 g/dL (ref 12.0–15.0)
Immature Granulocytes: 0 %
Lymphocytes Relative: 30 %
Lymphs Abs: 1.3 10*3/uL (ref 0.7–4.0)
MCH: 28.2 pg (ref 26.0–34.0)
MCHC: 32 g/dL (ref 30.0–36.0)
MCV: 88.2 fL (ref 80.0–100.0)
Monocytes Absolute: 0.3 10*3/uL (ref 0.1–1.0)
Monocytes Relative: 6 %
Neutro Abs: 2.7 10*3/uL (ref 1.7–7.7)
Neutrophils Relative %: 63 %
Platelets: 168 10*3/uL (ref 150–400)
RBC: 4.5 MIL/uL (ref 3.87–5.11)
RDW: 14.9 % (ref 11.5–15.5)
WBC: 4.2 10*3/uL (ref 4.0–10.5)
nRBC: 0 % (ref 0.0–0.2)

## 2020-10-30 LAB — COMPREHENSIVE METABOLIC PANEL
ALT: 16 U/L (ref 0–44)
AST: 18 U/L (ref 15–41)
Albumin: 3.3 g/dL — ABNORMAL LOW (ref 3.5–5.0)
Alkaline Phosphatase: 65 U/L (ref 38–126)
Anion gap: 7 (ref 5–15)
BUN: 23 mg/dL (ref 8–23)
CO2: 22 mmol/L (ref 22–32)
Calcium: 9.4 mg/dL (ref 8.9–10.3)
Chloride: 113 mmol/L — ABNORMAL HIGH (ref 98–111)
Creatinine, Ser: 1.31 mg/dL — ABNORMAL HIGH (ref 0.44–1.00)
GFR, Estimated: 44 mL/min — ABNORMAL LOW (ref >60)
Glucose, Bld: 111 mg/dL — ABNORMAL HIGH (ref 70–99)
Potassium: 4.3 mmol/L (ref 3.5–5.1)
Sodium: 142 mmol/L (ref 135–145)
Total Bilirubin: 0.4 mg/dL (ref 0.3–1.2)
Total Protein: 6.4 g/dL — ABNORMAL LOW (ref 6.5–8.1)

## 2020-10-30 LAB — TOTAL PROTEIN, URINE DIPSTICK: Protein, ur: NEGATIVE mg/dL

## 2020-10-30 LAB — TSH: TSH: 0.085 u[IU]/mL — ABNORMAL LOW (ref 0.308–3.960)

## 2020-10-30 MED ORDER — SODIUM CHLORIDE 0.9 % IV SOLN
Freq: Once | INTRAVENOUS | Status: DC
  Filled 2020-10-30: qty 250

## 2020-10-30 MED ORDER — SODIUM CHLORIDE 0.9% FLUSH
10.0000 mL | Freq: Once | INTRAVENOUS | Status: AC
  Administered 2020-10-30: 10 mL
  Filled 2020-10-30: qty 10

## 2020-10-30 MED ORDER — SODIUM CHLORIDE 0.9 % IV SOLN
200.0000 mg | Freq: Once | INTRAVENOUS | Status: AC
  Administered 2020-10-30: 200 mg via INTRAVENOUS
  Filled 2020-10-30: qty 8

## 2020-10-30 NOTE — Progress Notes (Signed)
[facility] [place] OFFICE PROGRESS NOTE  Patient Care Team: Jonathon Jordan, MD as PCP - General (Family Medicine) Awanda Mink Craige Cotta, RN as Registered Nurse (Oncology)  ASSESSMENT & PLAN:  Endometrial cancer [facility]) Overall, her exam is benign CT imaging show persistent right axillary lymphadenopathy but overall stable in size She tolerated treatment very well without major side effects except hypertension We will continue treatment as scheduled I plan to space out her imaging study to every 6 months, her next imaging will be in December  Chronic kidney disease (CKD), stage III (moderate) (HCC) Recent renal function fluctuates up and down We discussed the importance of aggressive risk factor modification  Essential hypertension She has fluctuation of BP I will defer to her primary care for further management  No orders of the defined types were placed in this encounter.   All questions were answered. The patient knows to call the clinic with any problems, questions or concerns. The total time spent in the appointment was 20 minutes encounter with patients including review of chart and various tests results, discussions about plan of care and coordination of care plan   [name], MD 10/30/2020 10:35 AM  INTERVAL HISTORY: Please see below for problem oriented charting. 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 Leo Grosser who obtained an  endometrial biopsy showing poorly differentiated endometrial carcinoma and negative endocervical curettage    12/12/2017 Imaging   MAMMOGRAM FINDINGS: In the right axilla, a possible mass warrants further evaluation. In the left breast, no findings suspicious for malignancy.   Images were processed with CAD.   IMPRESSION: Further evaluation is suggested for possible mass in the right axilla.      12/24/2017 Imaging   Ct scan chest, abdomen and pelvis 1. Marked thickening of the endometrium (42 mm) compatible with known primary endometrial malignancy. No evidence of extrauterine invasion. 2. No pelvic or retroperitoneal adenopathy. 3. Right middle lobe 4 mm solid pulmonary nodule, for which follow-up chest CT is advised in 3-6 months. 4. Several findings that are equivocal for distant metastatic disease. Vaguely nodular heterogeneous hyperenhancement in the peripheral right liver lobe, which could represent benign transient perfusional phenomena, with underlying liver lesions not entirely excluded. Mildly sclerotic T12 vertebral lesion. Mildly enlarged right axillary lymph node. The best single test to further evaluate these findings would be a PET-CT. Alternative tests include bone scan or thoracic MRI without and with IV contrast for the T12 osseous lesion, MRI abdomen without and with IV contrast for the liver findings, and diagnostic mammographic evaluation for the right axillary node. 5.  Aortic Atherosclerosis (ICD10-I70.0).      01/09/2018 PET scan   1. Moderate hypermetabolism corresponding to enlarging axillary node since 12/22/2017. Highly suspicious for an atypical distribution of metastatic disease. 2. No hypermetabolism to suggest hepatic or T12 osseous metastasis. 3. Hypermetabolic endometrial primary.    01/23/2018 Initial Diagnosis   Endometrial cancer ([place])    01/23/2018 Pathology Results   1. Lymph node,  sentinel, biopsy, left external iliac - NO CARCINOMA IDENTIFIED IN  ONE LYMPH NODE (0/1) - SEE COMMENT 2. Lymph nodes, regional resection, right para aortic - NO CARCINOMA IDENTIFIED IN FOUR LYMPH NODES (0/4) - SEE COMMENT 3. Lymph nodes, regional resection, right pelvic - NO CARCINOMA IDENTIFIED IN EIGHT LYMPH NODES (0/8) - SEE COMMENT 4. Cul-de-sac biopsy - METASTATIC CARCINOMA 5. Uterus +/- tubes/ovaries, neoplastic, cervix, bilateral fallopian tubes and ovaries UTERUS: - MIXED SEROUS AND ENDOMETRIOID CARCINOMA - SEROSAL IMPLANTS PRESENT - LYMPHOVASCULAR SPACE INVASION PRESENT - LEIOMYOMATA (1.5 CM; LARGEST) - SEE ONCOLOGY TABLE AND COMMENT BELOW CERVIX: - BENIGN NABOTHIAN CYSTS - NO CARCINOMA IDENTIFIED BILATERAL OVARIES: - METASTATIC CARCINOMA PRESENT ON OVARIAN SURFACE BILATERAL FALLOPIAN TUBES: - INTRALUMINAL CARCINOMA Microscopic Comment 1. -3. Cytokeratin AE1/3 was performed on the sentinel lymph nodes to exclude micrometastasis. There is no evidence of metastatic carcinoma by immunohistochemistry. 5. 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%) Histologic Grade: N/A Myometrial invasion: Estimated less than 50% myometrial invasion (0.3 cm of myometrium involved; 1.4 cm measured thickness) Uterine Serosa Involvement: Present Cervical stromal involvement: Not identified Extent of involvement of other organs: - Fallopian tube (left within the lumen) - Ovary, left (surface involvement) - Cul-de-sac Lymphovascular invasion: Present Regional Lymph Nodes: Examined: 1 Sentinel 12 Non-sentinel 13 Total Lymph nodes with metastasis: 0 Isolated tumor cells (< 0.2 mm): 0 Micrometastasis: (> 0.2 mm and < 2.0 mm): 0 Macrometastasis: (> 2.0 mm): 0 Extracapsular extension: N/A Tumor block for ancillary studies: 5E,  5B MMR / MSI testing: Pending will be reported separately Pathologic Stage Classification (pTNM, AJCC 8th edition): pT3a, pN0 FIGO Stage: IIIA COMMENT: There is tumor present on the surface of the left ovary and within the lumen of the left fallopian tube. The carcinoma appears to be mixed with the largest component being high grade serous carcinoma. Dr. Lyndon Code reviewed the case and agrees with the above diagnosis.    01/23/2018 Surgery   Surgeon: Donaciano Eva     Operation: Robotic-assisted laparoscopic total hysterectomy with bilateral salpingoophorectomy, SLN injection, mapping and biopsy, right pelvic and para-aortic lymphadenectomy   Operative Findings:  : 10-12cm bulky uterus with frank serosal involvement on posterior cul de sac peritoneum and anterior peritoneum which adhesed the bladder to the anterior uterus. Unilateral mapping on left pelvis. No grossly suspicious nodes. Normal omentum and diaphragms.     02/01/2018 Pathology Results   Lymph node, needle/core biopsy, right axilla - METASTATIC CARCINOMA - SEE COMMENT Microscopic Comment The neoplastic cells are positive for cytokeratin 7 and Pax-8 but negative for cytokeratin 5/6, cytokeratin 20, Gata-3, p63, p53 and GCDFP. Overall, the immunoprofile is consistent with metastasis from the patient's known gynecologic carcinoma.    02/01/2018 Procedure   Ultrasound-guided core biopsies of a suspicious right axillary lymph node.    02/21/2018 - 06/13/2018 Chemotherapy   The patient had carboplatin & Taxol x 6    03/26/2018 - 04/30/2018 Radiation Therapy   Radiation treatment dates:   03/26/18, 04/02/18, 04/19/18, 04/23/18 04/30/18   Site/dose: proximal Vagina, 6 Gy in 5 fractions for a total dose of 30 Gy      04/26/2018 PET scan   1. Interval resection of the hypermetabolic endometrial primary. No discernible hypermetabolic pelvic sidewall or peritoneal metastases. 2. Stable hypermetabolic bilateral axillary lymphadenopathy.  The degree of hypermetabolism in these lymph nodes remains highly  suspicious for neoplasm. 3. Interval development of low level FDG uptake in 2 stable, small lymph nodes in the left groin region. This may be reactive, but close attention recommended to exclude metastatic involvement. 4. Stable non hypermetabolic low-density liver lesions and the mixed lucent and sclerotic T12 lesion is stable without hypermetabolism today.    07/09/2018 Imaging   Status post hysterectomy.   Mild axillary lymphadenopathy, improved. Nodal metastases not excluded.   Irregular wall thickening involving the anterior bladder, possibly reflecting radiation cystitis versus tumor.   Additional stable findings as above, including a 5 mm right middle lobe nodule and stable sclerosis involving the T12 vertebral body.    10/10/2018 Imaging   1. Stable exam. No findings within the abdomen or pelvis to suggest metastatic disease. 2. Unchanged appearance of sclerosis involving the T12 vertebra. 3.  Aortic Atherosclerosis (ICD10-I70.0).   10/10/2018 Imaging   Ct abdomen and pelvis 1. Stable exam. No findings within the abdomen or pelvis to suggest metastatic disease. 2. Unchanged appearance of sclerosis involving the T12 vertebra. 3.  Aortic Atherosclerosis (ICD10-I70.0).   01/16/2019 PET scan   1. Enlargement and increased activity in the left axillary, right axillary, and left inguinal adenopathy compatible with progressive malignancy. 2. Stable 4 mm right middle lobe subpleural nodule, not appreciably hypermetabolic. 3. Other imaging findings of potential clinical significance: Right kidney lower pole cyst. Aortic Atherosclerosis (ICD10-I70.0). Prominent stool throughout the colon favors constipation. Mild chronic left maxillary sinusitis.     02/01/2019 -  Chemotherapy   The patient had pembrolizumab and Lenvima for chemotherapy treatment.     04/25/2019 Imaging   Ct imaging 1. Interval response to therapy.  Decrease in size of bilateral axillary and left inguinal lymph nodes. No new or progressive findings identified. 2. Stable sclerotic appearance of the T12 vertebra. 3. Aortic atherosclerosis. Lad coronary artery calcification noted.   08/30/2019 Imaging   1. Bulky RIGHT hilar lymph node, slightly increased in size from previous imaging. 2. Multiple foci of hyperenhancement in the liver. 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 Consider MRI liver on follow-up to establish a baseline for number of lesions as these will appear variable on CT follow-up based on phase of contrast acquisition in the future. 3. Stable 5 mm nodule adjacent to the fissure, minor fissure in the RIGHT middle lobe.     11/28/2019 Imaging   1. Stable exam. No new or progressive findings. 2. Stable enlarged right axillary lymph node. 3. Stable tiny bilateral pulmonary nodules. Continued attention on follow-up recommended. 4. Scattered hypodensities in the liver parenchyma correspond to the hypervascular lesions seen on the previous study performed with intravenous contrast material. 5. Right renal cyst. 6. Aortic Atherosclerosis (ICD10-I70.0).     03/16/2020 Imaging   Increased echogenicity of renal parenchyma is noted bilaterally suggesting medical renal disease. No hydronephrosis or renal obstruction is noted   04/02/2020 Imaging   1. Right axillary lymphadenopathy is stable. Tiny bilateral pulmonary nodules are stable. Subcentimeter low-attenuation liver lesions are stable. 2. No new or progressive metastatic disease in the chest, abdomen or pelvis. 3. Aortic Atherosclerosis (ICD10-I70.0).   09/15/2020 Imaging   1. Stable RIGHT axillary lymph node with enlargement measuring 2 cm short axis. 2. Stable small nodule along  the minor fissure in the RIGHT chest. 3.  No new or progressive findings. 4. Aortic atherosclerosis.     REVIEW OF SYSTEMS:   Constitutional: Denies fevers, chills or abnormal weight loss Eyes: Denies blurriness of vision Ears, nose, mouth, throat, and face: Denies mucositis or sore throat Respiratory: Denies cough, dyspnea or wheezes Cardiovascular: Denies palpitation, chest discomfort or lower extremity swelling Gastrointestinal:  Denies nausea, heartburn or change in bowel habits Skin: Denies abnormal skin rashes Lymphatics: Denies new lymphadenopathy or easy bruising Neurological:Denies numbness, tingling or new weaknesses Behavioral/Psych: Mood is stable, no new changes  All other systems were reviewed with the patient and are negative.  I have reviewed the past medical history, past surgical history, social history and family history with the patient and they are unchanged from previous note.  ALLERGIES:  is allergic to sulfa antibiotics and sulfamethoxazole.  MEDICATIONS:  Current Outpatient Medications  Medication Sig Dispense Refill   amLODipine-valsartan (EXFORGE) 5-160 MG tablet Take 1 tablet by mouth daily.     Calcium Carb-Cholecalciferol 600-800 MG-UNIT TABS Take 1 tablet by mouth daily.     calcium carbonate (OSCAL) 1500 (600 Ca) MG TABS tablet 1 tablet     Cholecalciferol 50 MCG (2000 UT) CAPS 2 capsules     ergocalciferol (VITAMIN D2) 1.25 MG (50000 UT) capsule Take 1 capsule (50,000 Units total) by mouth once a week. 12 capsule 1   lenvatinib 10 mg daily dose (LENVIMA, 10 MG DAILY DOSE,) capsule Take 1 capsule (10 mg total) by mouth daily. 30 capsule 11   levothyroxine (SYNTHROID) 150 MCG tablet Take 1 tablet (150 mcg total) by mouth daily before breakfast. 30 tablet 3   lidocaine-prilocaine (EMLA) cream Apply topically daily as needed. 30 g 3   OneTouch Delica Lancets 84Y MISC 1 each by Other route 2 (two) times daily. Use to monitor glucose levels BID; E11.65 100  each 2   ONETOUCH VERIO test strip 1 each by Other route 3 (three) times daily. And lancets 3/day 300 each 3   RELION PEN NEEDLES 32G X 4 MM MISC USE 1 PEN PER DAY 50 each 5   simvastatin (ZOCOR) 10 MG tablet Take 10 mg by mouth at bedtime.      No current facility-administered medications for this visit.   Facility-Administered Medications Ordered in Other Visits  Medication Dose Route Frequency Provider Last Rate Last Admin   0.9 %  sodium chloride infusion   Intravenous Once Alvy Bimler, Rubi Tooley, MD        PHYSICAL EXAMINATION: ECOG PERFORMANCE STATUS: 0 - Asymptomatic  Vitals:   10/30/20 0819  BP: (!) 166/107  Pulse: 89  Resp: 15  Temp: (!) 94.7 F (34.8 C)  SpO2: 100%   Filed Weights   10/30/20 0819  Weight: 163 lb 6.4 oz (74.1 kg)    GENERAL:alert, no distress and comfortable SKIN: skin color, texture, turgor are normal, no rashes or significant lesions EYES: normal, Conjunctiva are pink and non-injected, sclera clear OROPHARYNX:no exudate, no erythema and lips, buccal mucosa, and tongue normal  NECK: supple, thyroid normal size, non-tender, without nodularity LYMPH:  no palpable lymphadenopathy in the cervical, axillary or inguinal LUNGS: clear to auscultation and percussion with normal breathing effort HEART: regular rate & rhythm and no murmurs and no lower extremity edema ABDOMEN:abdomen soft, non-tender and normal bowel sounds Musculoskeletal:no cyanosis of digits and no clubbing  NEURO: alert & oriented x 3 with fluent speech, no focal motor/sensory deficits  LABORATORY DATA:  I have reviewed the data as listed    Component Value Date/Time  NA 142 10/30/2020 0757   K 4.3 10/30/2020 0757   CL 113 (H) 10/30/2020 0757   CO2 22 10/30/2020 0757   GLUCOSE 111 (H) 10/30/2020 0757   BUN 23 10/30/2020 0757   CREATININE 1.31 (H) 10/30/2020 0757   CREATININE 1.75 (H) 01/10/2020 1200   CALCIUM 9.4 10/30/2020 0757   PROT 6.4 (L) 10/30/2020 0757   ALBUMIN 3.3 (L)  10/30/2020 0757   AST 18 10/30/2020 0757   AST 13 (L) 01/10/2020 1200   ALT 16 10/30/2020 0757   ALT 11 01/10/2020 1200   ALKPHOS 65 10/30/2020 0757   BILITOT 0.4 10/30/2020 0757   BILITOT 0.4 01/10/2020 1200   GFRNONAA 44 (L) 10/30/2020 0757   GFRNONAA 29 (L) 01/10/2020 1200   GFRAA 34 (L) 01/10/2020 1200    No results found for: SPEP, UPEP  Lab Results  Component Value Date   WBC 4.2 10/30/2020   NEUTROABS 2.7 10/30/2020   HGB 12.7 10/30/2020   HCT 39.7 10/30/2020   MCV 88.2 10/30/2020   PLT 168 10/30/2020      Chemistry      Component Value Date/Time   NA 142 10/30/2020 0757   K 4.3 10/30/2020 0757   CL 113 (H) 10/30/2020 0757   CO2 22 10/30/2020 0757   BUN 23 10/30/2020 0757   CREATININE 1.31 (H) 10/30/2020 0757   CREATININE 1.75 (H) 01/10/2020 1200      Component Value Date/Time   CALCIUM 9.4 10/30/2020 0757   ALKPHOS 65 10/30/2020 0757   AST 18 10/30/2020 0757   AST 13 (L) 01/10/2020 1200   ALT 16 10/30/2020 0757   ALT 11 01/10/2020 1200   BILITOT 0.4 10/30/2020 0757   BILITOT 0.4 01/10/2020 1200

## 2020-10-30 NOTE — Assessment & Plan Note (Signed)
Overall, her exam is benign CT imaging show persistent right axillary lymphadenopathy but overall stable in size She tolerated treatment very well without major side effects except hypertension We will continue treatment as scheduled I plan to space out her imaging study to every 6 months, her next imaging will be in December

## 2020-10-30 NOTE — Assessment & Plan Note (Signed)
Recent renal function fluctuates up and down We discussed the importance of aggressive risk factor modification

## 2020-10-30 NOTE — Assessment & Plan Note (Signed)
She has fluctuation of BP I will defer to her primary care for further management

## 2020-10-30 NOTE — Patient Instructions (Signed)
[facility]  Discharge Instructions: ?Thank you for choosing [facility] to provide your oncology and hematology care.  ? ?If you have a lab appointment with the Cancer Center, please go directly to the Cancer Center and check in at the registration area. ?  ?Wear comfortable clothing and clothing appropriate for easy access to any Portacath or PICC line.  ? ?We strive to give you quality time with your provider. You may need to reschedule your appointment if you arrive late (15 or more minutes).  Arriving late affects you and other patients whose appointments are after yours.  Also, if you miss three or more appointments without notifying the office, you may be dismissed from the clinic at the provider?s discretion.    ?  ?For prescription refill requests, have your pharmacy contact our office and allow 72 hours for refills to be completed.   ? ?Today you received the following chemotherapy and/or immunotherapy agents: Keytruda ?  ?To help prevent nausea and vomiting after your treatment, we encourage you to take your nausea medication as directed. ? ?BELOW ARE SYMPTOMS THAT SHOULD BE REPORTED IMMEDIATELY: ?*FEVER GREATER THAN 100.4 F (38 ?C) OR HIGHER ?*CHILLS OR SWEATING ?*NAUSEA AND VOMITING THAT IS NOT CONTROLLED WITH YOUR NAUSEA MEDICATION ?*UNUSUAL SHORTNESS OF BREATH ?*UNUSUAL BRUISING OR BLEEDING ?*URINARY PROBLEMS (pain or burning when urinating, or frequent urination) ?*BOWEL PROBLEMS (unusual diarrhea, constipation, pain near the anus) ?TENDERNESS IN MOUTH AND THROAT WITH OR WITHOUT PRESENCE OF ULCERS (sore throat, sores in mouth, or a toothache) ?UNUSUAL RASH, SWELLING OR PAIN  ?UNUSUAL VAGINAL DISCHARGE OR ITCHING  ? ?Items with * indicate a potential emergency and should be followed up as soon as possible or go to the Emergency Department if any problems should occur. ? ?Please show the CHEMOTHERAPY ALERT CARD or IMMUNOTHERAPY ALERT CARD at check-in to the  Emergency Department and triage nurse. ? ?Should you have questions after your visit or need to cancel or reschedule your appointment, please contact [facility]  Dept: [phone]  and follow the prompts.  Office hours are 8:00 a.m. to 4:30 p.m. Monday - Friday. Please note that voicemails left after 4:00 p.m. may not be returned until the following business day.  We are closed weekends and major holidays. You have access to a nurse at all times for urgent questions. Please call the main number to the clinic Dept: [phone] and follow the prompts. ? ? ?For any non-urgent questions, you may also contact your provider using MyChart. We now offer e-Visits for anyone 18 and older to request care online for non-urgent symptoms. For details visit [email or web address]. ?  ?Also download the MyChart app! Go to the app store, search "MyChart", open the app, select [facility], and log in with your MyChart username and password. ? ?Due to Covid, a mask is required upon entering the hospital/clinic. If you do not have a mask, one will be given to you upon arrival. For doctor visits, patients may have 1 support person aged 18 or older with them. For treatment visits, patients cannot have anyone with them due to current Covid guidelines and our immunocompromised population.  ? ?

## 2020-10-30 NOTE — Patient Instructions (Signed)

## 2020-11-02 ENCOUNTER — Other Ambulatory Visit: Payer: Self-pay

## 2020-11-02 ENCOUNTER — Telehealth: Payer: Self-pay

## 2020-11-02 MED ORDER — LEVOTHYROXINE SODIUM 125 MCG PO TABS: 125.0000 ug | ORAL_TABLET | Freq: Every day | ORAL | 3 refills | Status: DC

## 2020-11-02 NOTE — Telephone Encounter (Signed)
Called and left another message with the below message. Rx sent to [facility].  Ask her to call the office with questions.

## 2020-11-02 NOTE — Telephone Encounter (Signed)
Called and left below message. Ask her to call the office back. 

## 2020-11-02 NOTE — Telephone Encounter (Signed)
-----   Message from [name], MD sent at 11/02/2020  8:55 AM EDT ----- [facility] TSH is low The dose of synthroid needs to be reduced to 125 mcg Pls call [facility] If she does not have 125 mcg, pls call in to [facility] pharmacy

## 2020-11-04 DIAGNOSIS — Z20822 Contact with and (suspected) exposure to covid-19: Secondary | ICD-10-CM | POA: Diagnosis not present

## 2020-11-16 DIAGNOSIS — Z20822 Contact with and (suspected) exposure to covid-19: Secondary | ICD-10-CM | POA: Diagnosis not present

## 2020-11-17 ENCOUNTER — Telehealth: Payer: Self-pay | Admitting: *Deleted

## 2020-11-17 NOTE — Telephone Encounter (Signed)
Contacted patient in response to message left 11/16/20. Patient states she discovered yesterday 11/16/20 she was exposed to some one with Covid 12 days ago - on 11/05/20. She took a Covid test 11/16/20 - negative - and stated she has no symptoms and feels well.  Patient's next appt is Friday 11/20/20 for labs and infusion. Patient asking if she should still come to the appointments.  Call information/question routed to Dr. Alvy Bimler

## 2020-11-17 NOTE — Telephone Encounter (Signed)
Yes, she may keep her appt

## 2020-11-17 NOTE — Telephone Encounter (Signed)
Patient notified of Dr. Alvy Bimler response and verbalized understanding

## 2020-11-20 ENCOUNTER — Inpatient Hospital Stay: Payer: Medicare Other

## 2020-11-20 ENCOUNTER — Other Ambulatory Visit: Payer: Self-pay

## 2020-11-20 ENCOUNTER — Inpatient Hospital Stay: Payer: Medicare Other | Attending: Gynecology

## 2020-11-20 VITALS — BP 174/104 | HR 80 | Temp 97.8°F | Resp 16 | Wt 161.0 lb

## 2020-11-20 DIAGNOSIS — I129 Hypertensive chronic kidney disease with stage 1 through stage 4 chronic kidney disease, or unspecified chronic kidney disease: Secondary | ICD-10-CM | POA: Diagnosis not present

## 2020-11-20 DIAGNOSIS — Z79899 Other long term (current) drug therapy: Secondary | ICD-10-CM | POA: Insufficient documentation

## 2020-11-20 DIAGNOSIS — Z5112 Encounter for antineoplastic immunotherapy: Secondary | ICD-10-CM | POA: Insufficient documentation

## 2020-11-20 DIAGNOSIS — C541 Malignant neoplasm of endometrium: Secondary | ICD-10-CM | POA: Insufficient documentation

## 2020-11-20 DIAGNOSIS — Z7189 Other specified counseling: Secondary | ICD-10-CM

## 2020-11-20 DIAGNOSIS — N183 Chronic kidney disease, stage 3 unspecified: Secondary | ICD-10-CM | POA: Diagnosis not present

## 2020-11-20 DIAGNOSIS — C773 Secondary and unspecified malignant neoplasm of axilla and upper limb lymph nodes: Secondary | ICD-10-CM

## 2020-11-20 DIAGNOSIS — E039 Hypothyroidism, unspecified: Secondary | ICD-10-CM

## 2020-11-20 LAB — CBC WITH DIFFERENTIAL/PLATELET
Abs Immature Granulocytes: 0.04 10*3/uL (ref 0.00–0.07)
Basophils Absolute: 0 10*3/uL (ref 0.0–0.1)
Basophils Relative: 0 %
Eosinophils Absolute: 0 10*3/uL (ref 0.0–0.5)
Eosinophils Relative: 1 %
HCT: 38.7 % (ref 36.0–46.0)
Hemoglobin: 12.7 g/dL (ref 12.0–15.0)
Immature Granulocytes: 1 %
Lymphocytes Relative: 33 %
Lymphs Abs: 1.4 10*3/uL (ref 0.7–4.0)
MCH: 28.4 pg (ref 26.0–34.0)
MCHC: 32.8 g/dL (ref 30.0–36.0)
MCV: 86.6 fL (ref 80.0–100.0)
Monocytes Absolute: 0.2 10*3/uL (ref 0.1–1.0)
Monocytes Relative: 5 %
Neutro Abs: 2.5 10*3/uL (ref 1.7–7.7)
Neutrophils Relative %: 60 %
Platelets: 175 10*3/uL (ref 150–400)
RBC: 4.47 MIL/uL (ref 3.87–5.11)
RDW: 14.8 % (ref 11.5–15.5)
WBC: 4.2 10*3/uL (ref 4.0–10.5)
nRBC: 0 % (ref 0.0–0.2)

## 2020-11-20 LAB — COMPREHENSIVE METABOLIC PANEL
ALT: 15 U/L (ref 0–44)
AST: 15 U/L (ref 15–41)
Albumin: 3.3 g/dL — ABNORMAL LOW (ref 3.5–5.0)
Alkaline Phosphatase: 68 U/L (ref 38–126)
Anion gap: 10 (ref 5–15)
BUN: 20 mg/dL (ref 8–23)
CO2: 21 mmol/L — ABNORMAL LOW (ref 22–32)
Calcium: 9.3 mg/dL (ref 8.9–10.3)
Chloride: 112 mmol/L — ABNORMAL HIGH (ref 98–111)
Creatinine, Ser: 1.34 mg/dL — ABNORMAL HIGH (ref 0.44–1.00)
GFR, Estimated: 43 mL/min — ABNORMAL LOW (ref >60)
Glucose, Bld: 127 mg/dL — ABNORMAL HIGH (ref 70–99)
Potassium: 3.6 mmol/L (ref 3.5–5.1)
Sodium: 143 mmol/L (ref 135–145)
Total Bilirubin: 0.5 mg/dL (ref 0.3–1.2)
Total Protein: 6.2 g/dL — ABNORMAL LOW (ref 6.5–8.1)

## 2020-11-20 LAB — TSH: TSH: 2.121 u[IU]/mL (ref 0.308–3.960)

## 2020-11-20 LAB — TOTAL PROTEIN, URINE DIPSTICK: Protein, ur: 30 mg/dL — AB

## 2020-11-20 MED ORDER — SODIUM CHLORIDE 0.9 % IV SOLN
Freq: Once | INTRAVENOUS | Status: AC
  Filled 2020-11-20: qty 250

## 2020-11-20 MED ORDER — SODIUM CHLORIDE 0.9% FLUSH
10.0000 mL | Freq: Once | INTRAVENOUS | Status: AC
  Administered 2020-11-20: 10 mL
  Filled 2020-11-20: qty 10

## 2020-11-20 MED ORDER — HEPARIN SOD (PORK) LOCK FLUSH 100 UNIT/ML IV SOLN
500.0000 [IU] | Freq: Once | INTRAVENOUS | Status: AC | PRN
  Administered 2020-11-20: 500 [IU]
  Filled 2020-11-20: qty 5

## 2020-11-20 MED ORDER — SODIUM CHLORIDE 0.9% FLUSH
10.0000 mL | INTRAVENOUS | Status: DC | PRN
  Administered 2020-11-20: 10 mL
  Filled 2020-11-20: qty 10

## 2020-11-20 MED ORDER — SODIUM CHLORIDE 0.9 % IV SOLN
200.0000 mg | Freq: Once | INTRAVENOUS | Status: AC
  Administered 2020-11-20: 200 mg via INTRAVENOUS
  Filled 2020-11-20: qty 8

## 2020-11-20 NOTE — Patient Instructions (Addendum)
[facility]   Discharge Instructions: Thank you for choosing [facility] to provide your oncology and hematology care.   If you have a lab appointment with the [facility], please go directly to the [facility] and check in at the registration area.   Wear comfortable clothing and clothing appropriate for easy access to any Portacath or PICC line.   We strive to give you quality time with your provider. You may need to reschedule your appointment if you arrive late (15 or more minutes).  Arriving late affects you and other patients whose appointments are after yours.  Also, if you miss three or more appointments without notifying the office, you may be dismissed from the clinic at the provider's discretion.      For prescription refill requests, have your pharmacy contact our office and allow 72 hours for refills to be completed.    Today you received the following chemotherapy and/or immunotherapy agents: pembrolizumab.      To help prevent nausea and vomiting after your treatment, we encourage you to take your nausea medication as directed.  BELOW ARE SYMPTOMS THAT SHOULD BE REPORTED IMMEDIATELY: *FEVER GREATER THAN 100.4 F (38 C) OR HIGHER *CHILLS OR SWEATING *NAUSEA AND VOMITING THAT IS NOT CONTROLLED WITH YOUR NAUSEA MEDICATION *UNUSUAL SHORTNESS OF BREATH *UNUSUAL BRUISING OR BLEEDING *URINARY PROBLEMS (pain or burning when urinating, or frequent urination) *BOWEL PROBLEMS (unusual diarrhea, constipation, pain near the anus) TENDERNESS IN MOUTH AND THROAT WITH OR WITHOUT PRESENCE OF ULCERS (sore throat, sores in mouth, or a toothache) UNUSUAL RASH, SWELLING OR PAIN  UNUSUAL VAGINAL DISCHARGE OR ITCHING   Items with * indicate a potential emergency and should be followed up as soon as possible or go to the Emergency Department if any problems should occur.  Please show the CHEMOTHERAPY ALERT CARD or IMMUNOTHERAPY ALERT CARD at  check-in to the Emergency Department and triage nurse.  Should you have questions after your visit or need to cancel or reschedule your appointment, please contact [facility]  Dept: [phone]  and follow the prompts.  Office hours are 8:00 a.m. to 4:30 p.m. Monday - Friday. Please note that voicemails left after 4:00 p.m. may not be returned until the following business day.  We are closed weekends and major holidays. You have access to a nurse at all times for urgent questions. Please call the main number to the clinic Dept: [phone] and follow the prompts.   For any non-urgent questions, you may also contact your provider using MyChart. We now offer e-Visits for anyone 74 and older to request care online for non-urgent symptoms. For details visit [email or web address].   Also download the MyChart app! Go to the app store, search "MyChart", open the app, select [facility], and log in with your MyChart username and password.  Due to Covid, a mask is required upon entering the hospital/clinic. If you do not have a mask, one will be given to you upon arrival. For doctor visits, patients may have 1 support person aged 81 or older with them. For treatment visits, patients cannot have anyone with them due to current Covid guidelines and our immunocompromised population.  [facility]  Discharge Instructions: Thank you for choosing [facility] to provide your oncology and hematology care.   If you have a lab appointment with the [facility], please go directly to the [facility] and check in at the registration  area.   Wear comfortable clothing and clothing appropriate for easy access to any Portacath or PICC line.   We strive to give you quality time with your provider. You may need to reschedule your appointment if you arrive late (15 or more minutes).  Arriving late affects you and other patients whose appointments  are after yours.  Also, if you miss three or more appointments without notifying the office, you may be dismissed from the clinic at the provider's discretion.      For prescription refill requests, have your pharmacy contact our office and allow 72 hours for refills to be completed.    Today you received the following chemotherapy and/or immunotherapy agent: Pembrolizumab (Keytruda).    To help prevent nausea and vomiting after your treatment, we encourage you to take your nausea medication as directed.  BELOW ARE SYMPTOMS THAT SHOULD BE REPORTED IMMEDIATELY: *FEVER GREATER THAN 100.4 F (38 C) OR HIGHER *CHILLS OR SWEATING *NAUSEA AND VOMITING THAT IS NOT CONTROLLED WITH YOUR NAUSEA MEDICATION *UNUSUAL SHORTNESS OF BREATH *UNUSUAL BRUISING OR BLEEDING *URINARY PROBLEMS (pain or burning when urinating, or frequent urination) *BOWEL PROBLEMS (unusual diarrhea, constipation, pain near the anus) TENDERNESS IN MOUTH AND THROAT WITH OR WITHOUT PRESENCE OF ULCERS (sore throat, sores in mouth, or a toothache) UNUSUAL RASH, SWELLING OR PAIN  UNUSUAL VAGINAL DISCHARGE OR ITCHING   Items with * indicate a potential emergency and should be followed up as soon as possible or go to the Emergency Department if any problems should occur.  Please show the CHEMOTHERAPY ALERT CARD or IMMUNOTHERAPY ALERT CARD at check-in to the Emergency Department and triage nurse.  Should you have questions after your visit or need to cancel or reschedule your appointment, please contact [facility]  Dept: [phone]  and follow the prompts.  Office hours are 8:00 a.m. to 4:30 p.m. Monday - Friday. Please note that voicemails left after 4:00 p.m. may not be returned until the following business day.  We are closed weekends and major holidays. You have access to a nurse at all times for urgent questions. Please call the main number to the clinic Dept: [phone] and follow the  prompts.   For any non-urgent questions, you may also contact your provider using MyChart. We now offer e-Visits for anyone 55 and older to request care online for non-urgent symptoms. For details visit [email or web address].   Also download the MyChart app! Go to the app store, search "MyChart", open the app, select [facility], and log in with your MyChart username and password.  Due to Covid, a mask is required upon entering the hospital/clinic. If you do not have a mask, one will be given to you upon arrival. For doctor visits, patients may have 1 support person aged 41 or older with them. For treatment visits, patients cannot have anyone with them due to current Covid guidelines and our immunocompromised population.

## 2020-11-29 DIAGNOSIS — Z20822 Contact with and (suspected) exposure to covid-19: Secondary | ICD-10-CM | POA: Diagnosis not present

## 2020-12-11 ENCOUNTER — Inpatient Hospital Stay: Payer: Medicare Other

## 2020-12-11 ENCOUNTER — Inpatient Hospital Stay (HOSPITAL_BASED_OUTPATIENT_CLINIC_OR_DEPARTMENT_OTHER): Payer: Medicare Other | Admitting: Hematology and Oncology

## 2020-12-11 ENCOUNTER — Other Ambulatory Visit: Payer: Self-pay

## 2020-12-11 ENCOUNTER — Encounter: Payer: Self-pay | Admitting: Hematology and Oncology

## 2020-12-11 DIAGNOSIS — Z79899 Other long term (current) drug therapy: Secondary | ICD-10-CM | POA: Diagnosis not present

## 2020-12-11 DIAGNOSIS — C541 Malignant neoplasm of endometrium: Secondary | ICD-10-CM

## 2020-12-11 DIAGNOSIS — C773 Secondary and unspecified malignant neoplasm of axilla and upper limb lymph nodes: Secondary | ICD-10-CM

## 2020-12-11 DIAGNOSIS — Z7189 Other specified counseling: Secondary | ICD-10-CM

## 2020-12-11 DIAGNOSIS — I10 Essential (primary) hypertension: Secondary | ICD-10-CM | POA: Diagnosis not present

## 2020-12-11 DIAGNOSIS — N183 Chronic kidney disease, stage 3 unspecified: Secondary | ICD-10-CM | POA: Diagnosis not present

## 2020-12-11 DIAGNOSIS — I129 Hypertensive chronic kidney disease with stage 1 through stage 4 chronic kidney disease, or unspecified chronic kidney disease: Secondary | ICD-10-CM | POA: Diagnosis not present

## 2020-12-11 DIAGNOSIS — E039 Hypothyroidism, unspecified: Secondary | ICD-10-CM

## 2020-12-11 DIAGNOSIS — Z5112 Encounter for antineoplastic immunotherapy: Secondary | ICD-10-CM | POA: Diagnosis not present

## 2020-12-11 LAB — CBC WITH DIFFERENTIAL/PLATELET
Abs Immature Granulocytes: 0.01 10*3/uL (ref 0.00–0.07)
Basophils Absolute: 0 10*3/uL (ref 0.0–0.1)
Basophils Relative: 0 %
Eosinophils Absolute: 0 10*3/uL (ref 0.0–0.5)
Eosinophils Relative: 0 %
HCT: 40.3 % (ref 36.0–46.0)
Hemoglobin: 13 g/dL (ref 12.0–15.0)
Immature Granulocytes: 0 %
Lymphocytes Relative: 31 %
Lymphs Abs: 1.4 10*3/uL (ref 0.7–4.0)
MCH: 28.3 pg (ref 26.0–34.0)
MCHC: 32.3 g/dL (ref 30.0–36.0)
MCV: 87.8 fL (ref 80.0–100.0)
Monocytes Absolute: 0.3 10*3/uL (ref 0.1–1.0)
Monocytes Relative: 6 %
Neutro Abs: 2.8 10*3/uL (ref 1.7–7.7)
Neutrophils Relative %: 63 %
Platelets: 184 10*3/uL (ref 150–400)
RBC: 4.59 MIL/uL (ref 3.87–5.11)
RDW: 14.8 % (ref 11.5–15.5)
WBC: 4.5 10*3/uL (ref 4.0–10.5)
nRBC: 0 % (ref 0.0–0.2)

## 2020-12-11 LAB — COMPREHENSIVE METABOLIC PANEL
ALT: 15 U/L (ref 0–44)
AST: 15 U/L (ref 15–41)
Albumin: 3.6 g/dL (ref 3.5–5.0)
Alkaline Phosphatase: 70 U/L (ref 38–126)
Anion gap: 10 (ref 5–15)
BUN: 25 mg/dL — ABNORMAL HIGH (ref 8–23)
CO2: 20 mmol/L — ABNORMAL LOW (ref 22–32)
Calcium: 9.3 mg/dL (ref 8.9–10.3)
Chloride: 113 mmol/L — ABNORMAL HIGH (ref 98–111)
Creatinine, Ser: 1.45 mg/dL — ABNORMAL HIGH (ref 0.44–1.00)
GFR, Estimated: 39 mL/min — ABNORMAL LOW (ref >60)
Glucose, Bld: 117 mg/dL — ABNORMAL HIGH (ref 70–99)
Potassium: 3.8 mmol/L (ref 3.5–5.1)
Sodium: 143 mmol/L (ref 135–145)
Total Bilirubin: 0.5 mg/dL (ref 0.3–1.2)
Total Protein: 6.5 g/dL (ref 6.5–8.1)

## 2020-12-11 LAB — TOTAL PROTEIN, URINE DIPSTICK: Protein, ur: NEGATIVE mg/dL

## 2020-12-11 MED ORDER — SODIUM CHLORIDE 0.9 % IV SOLN
200.0000 mg | Freq: Once | INTRAVENOUS | Status: AC
  Administered 2020-12-11: 200 mg via INTRAVENOUS
  Filled 2020-12-11: qty 8

## 2020-12-11 MED ORDER — SODIUM CHLORIDE 0.9 % IV SOLN: Freq: Once | INTRAVENOUS | Status: AC

## 2020-12-11 MED ORDER — SODIUM CHLORIDE 0.9% FLUSH
10.0000 mL | Freq: Once | INTRAVENOUS | Status: AC
  Administered 2020-12-11: 10 mL

## 2020-12-11 MED ORDER — HEPARIN SOD (PORK) LOCK FLUSH 100 UNIT/ML IV SOLN
500.0000 [IU] | Freq: Once | INTRAVENOUS | Status: AC | PRN
  Administered 2020-12-11: 500 [IU]

## 2020-12-11 MED ORDER — SODIUM CHLORIDE 0.9% FLUSH
10.0000 mL | INTRAVENOUS | Status: DC | PRN
  Administered 2020-12-11: 10 mL

## 2020-12-11 NOTE — Patient Instructions (Signed)
[facility]   Discharge Instructions: Thank you for choosing [facility] to provide your oncology and hematology care.   If you have a lab appointment with the [facility], please go directly to the [facility] and check in at the registration area.   Wear comfortable clothing and clothing appropriate for easy access to any Portacath or PICC line.   We strive to give you quality time with your provider. You may need to reschedule your appointment if you arrive late (15 or more minutes).  Arriving late affects you and other patients whose appointments are after yours.  Also, if you miss three or more appointments without notifying the office, you may be dismissed from the clinic at the provider's discretion.      For prescription refill requests, have your pharmacy contact our office and allow 72 hours for refills to be completed.    Today you received the following chemotherapy and/or immunotherapy agents: pembrolizumab.      To help prevent nausea and vomiting after your treatment, we encourage you to take your nausea medication as directed.  BELOW ARE SYMPTOMS THAT SHOULD BE REPORTED IMMEDIATELY: *FEVER GREATER THAN 100.4 F (38 C) OR HIGHER *CHILLS OR SWEATING *NAUSEA AND VOMITING THAT IS NOT CONTROLLED WITH YOUR NAUSEA MEDICATION *UNUSUAL SHORTNESS OF BREATH *UNUSUAL BRUISING OR BLEEDING *URINARY PROBLEMS (pain or burning when urinating, or frequent urination) *BOWEL PROBLEMS (unusual diarrhea, constipation, pain near the anus) TENDERNESS IN MOUTH AND THROAT WITH OR WITHOUT PRESENCE OF ULCERS (sore throat, sores in mouth, or a toothache) UNUSUAL RASH, SWELLING OR PAIN  UNUSUAL VAGINAL DISCHARGE OR ITCHING   Items with * indicate a potential emergency and should be followed up as soon as possible or go to the Emergency Department if any problems should occur.  Please show the CHEMOTHERAPY ALERT CARD or IMMUNOTHERAPY ALERT CARD at  check-in to the Emergency Department and triage nurse.  Should you have questions after your visit or need to cancel or reschedule your appointment, please contact [facility]  Dept: [phone]  and follow the prompts.  Office hours are 8:00 a.m. to 4:30 p.m. Monday - Friday. Please note that voicemails left after 4:00 p.m. may not be returned until the following business day.  We are closed weekends and major holidays. You have access to a nurse at all times for urgent questions. Please call the main number to the clinic Dept: [phone] and follow the prompts.   For any non-urgent questions, you may also contact your provider using MyChart. We now offer e-Visits for anyone 70 and older to request care online for non-urgent symptoms. For details visit [email or web address].   Also download the MyChart app! Go to the app store, search "MyChart", open the app, select [facility], and log in with your MyChart username and password.  Due to Covid, a mask is required upon entering the hospital/clinic. If you do not have a mask, one will be given to you upon arrival. For doctor visits, patients may have 1 support person aged 31 or older with them. For treatment visits, patients cannot have anyone with them due to current Covid guidelines and our immunocompromised population.  [facility]  Discharge Instructions: Thank you for choosing [facility] to provide your oncology and hematology care.   If you have a lab appointment with the [facility], please go directly to the [facility] and check in at the registration  area.   Wear comfortable clothing and clothing appropriate for easy access to any Portacath or PICC line.   We strive to give you quality time with your provider. You may need to reschedule your appointment if you arrive late (15 or more minutes).  Arriving late affects you and other patients whose appointments  are after yours.  Also, if you miss three or more appointments without notifying the office, you may be dismissed from the clinic at the provider's discretion.      For prescription refill requests, have your pharmacy contact our office and allow 72 hours for refills to be completed.    Today you received the following chemotherapy and/or immunotherapy agent: Pembrolizumab (Keytruda).    To help prevent nausea and vomiting after your treatment, we encourage you to take your nausea medication as directed.  BELOW ARE SYMPTOMS THAT SHOULD BE REPORTED IMMEDIATELY: *FEVER GREATER THAN 100.4 F (38 C) OR HIGHER *CHILLS OR SWEATING *NAUSEA AND VOMITING THAT IS NOT CONTROLLED WITH YOUR NAUSEA MEDICATION *UNUSUAL SHORTNESS OF BREATH *UNUSUAL BRUISING OR BLEEDING *URINARY PROBLEMS (pain or burning when urinating, or frequent urination) *BOWEL PROBLEMS (unusual diarrhea, constipation, pain near the anus) TENDERNESS IN MOUTH AND THROAT WITH OR WITHOUT PRESENCE OF ULCERS (sore throat, sores in mouth, or a toothache) UNUSUAL RASH, SWELLING OR PAIN  UNUSUAL VAGINAL DISCHARGE OR ITCHING   Items with * indicate a potential emergency and should be followed up as soon as possible or go to the Emergency Department if any problems should occur.  Please show the CHEMOTHERAPY ALERT CARD or IMMUNOTHERAPY ALERT CARD at check-in to the Emergency Department and triage nurse.  Should you have questions after your visit or need to cancel or reschedule your appointment, please contact [facility]  Dept: [phone]  and follow the prompts.  Office hours are 8:00 a.m. to 4:30 p.m. Monday - Friday. Please note that voicemails left after 4:00 p.m. may not be returned until the following business day.  We are closed weekends and major holidays. You have access to a nurse at all times for urgent questions. Please call the main number to the clinic Dept: [phone] and follow the  prompts.   For any non-urgent questions, you may also contact your provider using MyChart. We now offer e-Visits for anyone 70 and older to request care online for non-urgent symptoms. For details visit [email or web address].   Also download the MyChart app! Go to the app store, search "MyChart", open the app, select [facility], and log in with your MyChart username and password.  Due to Covid, a mask is required upon entering the hospital/clinic. If you do not have a mask, one will be given to you upon arrival. For doctor visits, patients may have 1 support person aged 39 or older with them. For treatment visits, patients cannot have anyone with them due to current Covid guidelines and our immunocompromised population.

## 2020-12-11 NOTE — Assessment & Plan Note (Signed)
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

## 2020-12-11 NOTE — Assessment & Plan Note (Signed)
She has fluctuation of BP I will defer to her primary care for further management

## 2020-12-11 NOTE — Assessment & Plan Note (Signed)
Recent renal function fluctuates up and down We discussed the importance of aggressive risk factor modification and to drink plenty of oral fluids 

## 2020-12-11 NOTE — Progress Notes (Signed)
[facility] OFFICE PROGRESS NOTE  Patient Care Team: Jonathon Jordan, MD as PCP - General (Family Medicine) Awanda Mink Craige Cotta, RN as Registered Nurse (Oncology)  ASSESSMENT & PLAN:  Endometrial cancer [facility]) Overall, her exam is benign CT imaging show persistent right axillary lymphadenopathy but overall stable in size; this is not palpable on clinical exam She tolerated treatment very well without major side effects except hypertension We will continue treatment as scheduled I plan to space out her imaging study to every 6 months, her next imaging will be in December  Essential hypertension She has fluctuation of BP I will defer to her primary care for further management  Chronic kidney disease (CKD), stage III (moderate) (HCC) Recent renal function fluctuates up and down We discussed the importance of aggressive risk factor modification and to drink plenty of oral fluids  No orders of the defined types were placed in this encounter.   All questions were answered. The patient knows to call the clinic with any problems, questions or concerns. The total time spent in the appointment was 20 minutes encounter with patients including review of chart and various tests results, discussions about plan of care and coordination of care plan   [name], MD 12/11/2020 8:49 AM  INTERVAL HISTORY: Please see below for problem oriented charting. 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 Leo Grosser who obtained an endometrial biopsy showing poorly differentiated endometrial carcinoma and negative endocervical curettage   12/12/2017 Imaging   MAMMOGRAM FINDINGS: In the right axilla, a possible mass warrants further evaluation. In the left breast, no findings suspicious for malignancy.   Images were processed with CAD.   IMPRESSION: Further evaluation is suggested for possible mass in the right axilla.     12/24/2017 Imaging   Ct scan chest, abdomen and pelvis 1. Marked thickening of the endometrium (42 mm) compatible with known primary endometrial malignancy. No evidence of extrauterine invasion. 2. No pelvic or retroperitoneal adenopathy. 3. Right middle lobe 4 mm solid pulmonary nodule, for which follow-up chest CT is advised in 3-6 months. 4. Several findings that are equivocal for distant metastatic disease. Vaguely nodular heterogeneous hyperenhancement in the peripheral right liver lobe, which could represent benign transient perfusional phenomena, with underlying liver lesions not entirely excluded. Mildly sclerotic T12 vertebral lesion. Mildly enlarged right axillary lymph node. The best single test to further evaluate these findings would be a PET-CT. Alternative tests include bone scan or thoracic MRI without and with IV contrast for the T12 osseous lesion, MRI abdomen without and with IV contrast for the liver findings, and diagnostic mammographic evaluation for the right axillary node. 5.  Aortic Atherosclerosis (ICD10-I70.0).     01/09/2018 PET scan   1. Moderate hypermetabolism corresponding to enlarging axillary node since 12/22/2017. Highly suspicious for an atypical distribution of metastatic disease. 2. No hypermetabolism to suggest hepatic or T12 osseous metastasis. 3. Hypermetabolic endometrial primary.   01/23/2018 Initial Diagnosis   Endometrial cancer ([place])  01/23/2018 Pathology Results   1.  Lymph node, sentinel, biopsy, left external iliac - NO CARCINOMA IDENTIFIED IN ONE LYMPH NODE (0/1) - SEE COMMENT 2. Lymph nodes, regional resection, right para aortic - NO CARCINOMA IDENTIFIED IN FOUR LYMPH NODES (0/4) - SEE COMMENT 3. Lymph nodes, regional resection, right pelvic - NO CARCINOMA IDENTIFIED IN EIGHT LYMPH NODES (0/8) - SEE COMMENT 4. Cul-de-sac biopsy - METASTATIC CARCINOMA 5. Uterus +/- tubes/ovaries, neoplastic, cervix, bilateral fallopian tubes and ovaries UTERUS: - MIXED SEROUS AND ENDOMETRIOID CARCINOMA - SEROSAL IMPLANTS PRESENT - LYMPHOVASCULAR SPACE INVASION PRESENT - LEIOMYOMATA (1.5 CM; LARGEST) - SEE ONCOLOGY TABLE AND COMMENT BELOW CERVIX: - BENIGN NABOTHIAN CYSTS - NO CARCINOMA IDENTIFIED BILATERAL OVARIES: - METASTATIC CARCINOMA PRESENT ON OVARIAN SURFACE BILATERAL FALLOPIAN TUBES: - INTRALUMINAL CARCINOMA Microscopic Comment 1. -3. Cytokeratin AE1/3 was performed on the sentinel lymph nodes to exclude micrometastasis. There is no evidence of metastatic carcinoma by immunohistochemistry. 5. 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%) Histologic Grade: N/A Myometrial invasion: Estimated less than 50% myometrial invasion (0.3 cm of myometrium involved; 1.4 cm measured thickness) Uterine Serosa Involvement: Present Cervical stromal involvement: Not identified Extent of involvement of other organs: - Fallopian tube (left within the lumen) - Ovary, left (surface involvement) - Cul-de-sac Lymphovascular invasion: Present Regional Lymph Nodes: Examined: 1 Sentinel 12 Non-sentinel 13 Total Lymph nodes with metastasis: 0 Isolated tumor cells (< 0.2 mm): 0 Micrometastasis: (> 0.2 mm and < 2.0 mm): 0 Macrometastasis: (>  2.0 mm): 0 Extracapsular extension: N/A Tumor block for ancillary studies: 5E, 5B MMR / MSI testing: Pending will be reported separately Pathologic Stage Classification (pTNM, AJCC 8th edition): pT3a, pN0 FIGO Stage: IIIA COMMENT: There is tumor present on the surface of the left ovary and within the lumen of the left fallopian tube. The carcinoma appears to be mixed with the largest component being high grade serous carcinoma. Dr. Lyndon Code reviewed the case and agrees with the above diagnosis.   01/23/2018 Surgery   Surgeon: Donaciano Eva     Operation: Robotic-assisted laparoscopic total hysterectomy with bilateral salpingoophorectomy, SLN injection, mapping and biopsy, right pelvic and para-aortic lymphadenectomy   Operative Findings:  : 10-12cm bulky uterus with frank serosal involvement on posterior cul de sac peritoneum and anterior peritoneum which adhesed the bladder to the anterior uterus. Unilateral mapping on left pelvis. No grossly suspicious nodes. Normal omentum and diaphragms.    02/01/2018 Pathology Results   Lymph node, needle/core biopsy, right axilla - METASTATIC CARCINOMA - SEE COMMENT Microscopic Comment The neoplastic cells are positive for cytokeratin 7 and Pax-8 but negative for cytokeratin 5/6, cytokeratin 20, Gata-3, p63, p53 and GCDFP. Overall, the immunoprofile is consistent with metastasis from the patient's known gynecologic carcinoma.   02/01/2018 Procedure   Ultrasound-guided core biopsies of a suspicious right axillary lymph node.   02/21/2018 - 06/13/2018 Chemotherapy   The patient had carboplatin & Taxol x 6   03/26/2018 - 04/30/2018 Radiation Therapy   Radiation treatment dates:   03/26/18, 04/02/18, 04/19/18, 04/23/18 04/30/18   Site/dose: proximal Vagina, 6 Gy in 5 fractions for a total dose of 30 Gy     04/26/2018 PET scan   1. Interval resection of the hypermetabolic endometrial primary. No discernible hypermetabolic pelvic sidewall or peritoneal  metastases. 2. Stable hypermetabolic bilateral axillary lymphadenopathy. The degree of hypermetabolism in these lymph nodes  remains highly suspicious for neoplasm. 3. Interval development of low level FDG uptake in 2 stable, small lymph nodes in the left groin region. This may be reactive, but close attention recommended to exclude metastatic involvement. 4. Stable non hypermetabolic low-density liver lesions and the mixed lucent and sclerotic T12 lesion is stable without hypermetabolism today.   07/09/2018 Imaging   Status post hysterectomy.   Mild axillary lymphadenopathy, improved. Nodal metastases not excluded.   Irregular wall thickening involving the anterior bladder, possibly reflecting radiation cystitis versus tumor.   Additional stable findings as above, including a 5 mm right middle lobe nodule and stable sclerosis involving the T12 vertebral body.   10/10/2018 Imaging   1. Stable exam. No findings within the abdomen or pelvis to suggest metastatic disease. 2. Unchanged appearance of sclerosis involving the T12 vertebra. 3.  Aortic Atherosclerosis (ICD10-I70.0).   10/10/2018 Imaging   Ct abdomen and pelvis 1. Stable exam. No findings within the abdomen or pelvis to suggest metastatic disease. 2. Unchanged appearance of sclerosis involving the T12 vertebra. 3.  Aortic Atherosclerosis (ICD10-I70.0).   01/16/2019 PET scan   1. Enlargement and increased activity in the left axillary, right axillary, and left inguinal adenopathy compatible with progressive malignancy. 2. Stable 4 mm right middle lobe subpleural nodule, not appreciably hypermetabolic. 3. Other imaging findings of potential clinical significance: Right kidney lower pole cyst. Aortic Atherosclerosis (ICD10-I70.0). Prominent stool throughout the colon favors constipation. Mild chronic left maxillary sinusitis.     02/01/2019 -  Chemotherapy   The patient had pembrolizumab and Lenvima for chemotherapy treatment.      04/25/2019 Imaging   Ct imaging 1. Interval response to therapy. Decrease in size of bilateral axillary and left inguinal lymph nodes. No new or progressive findings identified. 2. Stable sclerotic appearance of the T12 vertebra. 3. Aortic atherosclerosis. Lad coronary artery calcification noted.   08/30/2019 Imaging   1. Bulky RIGHT hilar lymph node, slightly increased in size from previous imaging. 2. Multiple foci of hyperenhancement in the liver. 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 Consider MRI liver on follow-up to establish a baseline for number of lesions as these will appear variable on CT follow-up based on phase of contrast acquisition in the future. 3. Stable 5 mm nodule adjacent to the fissure, minor fissure in the RIGHT middle lobe.     11/28/2019 Imaging   1. Stable exam. No new or progressive findings. 2. Stable enlarged right axillary lymph node. 3. Stable tiny bilateral pulmonary nodules. Continued attention on follow-up recommended. 4. Scattered hypodensities in the liver parenchyma correspond to the hypervascular lesions seen on the previous study performed with intravenous contrast material. 5. Right renal cyst. 6. Aortic Atherosclerosis (ICD10-I70.0).     03/16/2020 Imaging   Increased echogenicity of renal parenchyma is noted bilaterally suggesting medical renal disease. No hydronephrosis or renal obstruction is noted   04/02/2020 Imaging   1. Right axillary lymphadenopathy is stable. Tiny bilateral pulmonary nodules are stable. Subcentimeter low-attenuation liver lesions are stable. 2. No new or progressive metastatic disease in the chest, abdomen or pelvis. 3. Aortic Atherosclerosis (ICD10-I70.0).   09/15/2020 Imaging   1. Stable RIGHT axillary lymph node with  enlargement measuring 2 cm short axis. 2. Stable small nodule along the minor fissure in the RIGHT chest. 3.  No new or progressive findings. 4. Aortic atherosclerosis.     REVIEW OF SYSTEMS:   Constitutional: Denies fevers, chills or abnormal weight loss Eyes: Denies blurriness of vision Ears, nose, mouth, throat, and face: Denies mucositis or sore throat Respiratory: Denies cough, dyspnea or wheezes Cardiovascular: Denies palpitation, chest discomfort or lower extremity swelling Gastrointestinal:  Denies nausea, heartburn or change in bowel habits Skin: Denies abnormal skin rashes Lymphatics: Denies new lymphadenopathy or easy bruising Neurological:Denies numbness, tingling or new weaknesses Behavioral/Psych: Mood is stable, no new changes  All other systems were reviewed with the patient and are negative.  I have reviewed the past medical history, past surgical history, social history and family history with the patient and they are unchanged from previous note.  ALLERGIES:  is allergic to sulfa antibiotics and sulfamethoxazole.  MEDICATIONS:  Current Outpatient Medications  Medication Sig Dispense Refill   amLODipine-valsartan (EXFORGE) 5-160 MG tablet Take 1 tablet by mouth daily.     Calcium Carb-Cholecalciferol 600-800 MG-UNIT TABS Take 1 tablet by mouth daily.     ergocalciferol (VITAMIN D2) 1.25 MG (50000 UT) capsule Take 1 capsule (50,000 Units total) by mouth once a week. 12 capsule 1   lenvatinib 10 mg daily dose (LENVIMA, 10 MG DAILY DOSE,) capsule Take 1 capsule (10 mg total) by mouth daily. 30 capsule 11   levothyroxine (SYNTHROID) 125 MCG tablet Take 1 tablet (125 mcg total) by mouth daily before breakfast. 30 tablet 3   lidocaine-prilocaine (EMLA) cream Apply topically daily as needed. 30 g 3   OneTouch Delica Lancets 89H MISC 1 each by Other route 2 (two) times daily. Use to monitor glucose levels BID; E11.65 100 each 2   ONETOUCH VERIO test strip 1 each by Other  route 3 (three) times daily. And lancets 3/day 300 each 3   RELION PEN NEEDLES 32G X 4 MM MISC USE 1 PEN PER DAY 50 each 5   simvastatin (ZOCOR) 10 MG tablet Take 10 mg by mouth at bedtime.      No current facility-administered medications for this visit.    PHYSICAL EXAMINATION: ECOG PERFORMANCE STATUS: 0 - Asymptomatic  Vitals:   12/11/20 0831  BP: (!) 161/99  Pulse: 86  Resp: 18  Temp: 97.6 F (36.4 C)  SpO2: 100%   Filed Weights   12/11/20 0831  Weight: 159 lb 9.6 oz (72.4 kg)    GENERAL:alert, no distress and comfortable SKIN: skin color, texture, turgor are normal, no rashes or significant lesions EYES: normal, Conjunctiva are pink and non-injected, sclera clear OROPHARYNX:no exudate, no erythema and lips, buccal mucosa, and tongue normal  NECK: supple, thyroid normal size, non-tender, without nodularity LYMPH:  no palpable lymphadenopathy in the cervical, axillary or inguinal LUNGS: clear to auscultation and percussion with normal breathing effort HEART: regular rate & rhythm and no murmurs and no lower extremity edema ABDOMEN:abdomen soft, non-tender and normal bowel sounds Musculoskeletal:no cyanosis of digits and no clubbing  NEURO: alert & oriented x 3 with fluent speech, no focal motor/sensory deficits  LABORATORY DATA:  I have reviewed the data as listed    Component Value Date/Time   NA 143 11/20/2020 0758   K 3.6 11/20/2020 0758   CL 112 (H) 11/20/2020 0758   CO2 21 (L) 11/20/2020 0758   GLUCOSE 127 (H) 11/20/2020 0758   BUN 20 11/20/2020 0758   CREATININE 1.34 (H) 11/20/2020 0758   CREATININE 1.75 (H) 01/10/2020 1200   CALCIUM 9.3 11/20/2020 0758   PROT 6.2 (L) 11/20/2020  0758   ALBUMIN 3.3 (L) 11/20/2020 0758   AST 15 11/20/2020 0758   AST 13 (L) 01/10/2020 1200   ALT 15 11/20/2020 0758   ALT 11 01/10/2020 1200   ALKPHOS 68 11/20/2020 0758   BILITOT 0.5 11/20/2020 0758   BILITOT 0.4 01/10/2020 1200   GFRNONAA 43 (L) 11/20/2020 0758    GFRNONAA 29 (L) 01/10/2020 1200   GFRAA 34 (L) 01/10/2020 1200    No results found for: SPEP, UPEP  Lab Results  Component Value Date   WBC 4.5 12/11/2020   NEUTROABS 2.8 12/11/2020   HGB 13.0 12/11/2020   HCT 40.3 12/11/2020   MCV 87.8 12/11/2020   PLT 184 12/11/2020      Chemistry      Component Value Date/Time   NA 143 11/20/2020 0758   K 3.6 11/20/2020 0758   CL 112 (H) 11/20/2020 0758   CO2 21 (L) 11/20/2020 0758   BUN 20 11/20/2020 0758   CREATININE 1.34 (H) 11/20/2020 0758   CREATININE 1.75 (H) 01/10/2020 1200      Component Value Date/Time   CALCIUM 9.3 11/20/2020 0758   ALKPHOS 68 11/20/2020 0758   AST 15 11/20/2020 0758   AST 13 (L) 01/10/2020 1200   ALT 15 11/20/2020 0758   ALT 11 01/10/2020 1200   BILITOT 0.5 11/20/2020 0758   BILITOT 0.4 01/10/2020 1200

## 2020-12-14 DIAGNOSIS — E119 Type 2 diabetes mellitus without complications: Secondary | ICD-10-CM | POA: Diagnosis not present

## 2020-12-14 DIAGNOSIS — H524 Presbyopia: Secondary | ICD-10-CM | POA: Diagnosis not present

## 2020-12-31 DIAGNOSIS — Z0000 Encounter for general adult medical examination without abnormal findings: Secondary | ICD-10-CM | POA: Diagnosis not present

## 2020-12-31 DIAGNOSIS — E1122 Type 2 diabetes mellitus with diabetic chronic kidney disease: Secondary | ICD-10-CM | POA: Diagnosis not present

## 2020-12-31 DIAGNOSIS — E785 Hyperlipidemia, unspecified: Secondary | ICD-10-CM | POA: Diagnosis not present

## 2020-12-31 DIAGNOSIS — E559 Vitamin D deficiency, unspecified: Secondary | ICD-10-CM | POA: Diagnosis not present

## 2020-12-31 DIAGNOSIS — N1831 Chronic kidney disease, stage 3a: Secondary | ICD-10-CM | POA: Diagnosis not present

## 2020-12-31 DIAGNOSIS — I700 Atherosclerosis of aorta: Secondary | ICD-10-CM | POA: Diagnosis not present

## 2020-12-31 DIAGNOSIS — J309 Allergic rhinitis, unspecified: Secondary | ICD-10-CM | POA: Diagnosis not present

## 2020-12-31 DIAGNOSIS — I10 Essential (primary) hypertension: Secondary | ICD-10-CM | POA: Diagnosis not present

## 2020-12-31 DIAGNOSIS — E210 Primary hyperparathyroidism: Secondary | ICD-10-CM | POA: Diagnosis not present

## 2020-12-31 DIAGNOSIS — C541 Malignant neoplasm of endometrium: Secondary | ICD-10-CM | POA: Diagnosis not present

## 2021-01-01 ENCOUNTER — Inpatient Hospital Stay: Payer: Medicare Other | Attending: Gynecology

## 2021-01-01 ENCOUNTER — Other Ambulatory Visit: Payer: Self-pay | Admitting: *Deleted

## 2021-01-01 ENCOUNTER — Inpatient Hospital Stay (HOSPITAL_BASED_OUTPATIENT_CLINIC_OR_DEPARTMENT_OTHER): Payer: Medicare Other | Admitting: Hematology and Oncology

## 2021-01-01 ENCOUNTER — Other Ambulatory Visit: Payer: Self-pay

## 2021-01-01 ENCOUNTER — Encounter: Payer: Self-pay | Admitting: Hematology and Oncology

## 2021-01-01 ENCOUNTER — Other Ambulatory Visit: Payer: Self-pay | Admitting: Hematology and Oncology

## 2021-01-01 ENCOUNTER — Inpatient Hospital Stay: Payer: Medicare Other

## 2021-01-01 VITALS — BP 183/105 | HR 81 | Temp 97.2°F | Resp 18 | Wt 158.2 lb

## 2021-01-01 VITALS — BP 171/93 | HR 73

## 2021-01-01 DIAGNOSIS — E039 Hypothyroidism, unspecified: Secondary | ICD-10-CM | POA: Diagnosis not present

## 2021-01-01 DIAGNOSIS — E559 Vitamin D deficiency, unspecified: Secondary | ICD-10-CM

## 2021-01-01 DIAGNOSIS — Z5112 Encounter for antineoplastic immunotherapy: Secondary | ICD-10-CM | POA: Diagnosis not present

## 2021-01-01 DIAGNOSIS — E785 Hyperlipidemia, unspecified: Secondary | ICD-10-CM | POA: Diagnosis not present

## 2021-01-01 DIAGNOSIS — Z79899 Other long term (current) drug therapy: Secondary | ICD-10-CM | POA: Diagnosis not present

## 2021-01-01 DIAGNOSIS — C541 Malignant neoplasm of endometrium: Secondary | ICD-10-CM

## 2021-01-01 DIAGNOSIS — C773 Secondary and unspecified malignant neoplasm of axilla and upper limb lymph nodes: Secondary | ICD-10-CM

## 2021-01-01 DIAGNOSIS — I10 Essential (primary) hypertension: Secondary | ICD-10-CM

## 2021-01-01 DIAGNOSIS — Z7189 Other specified counseling: Secondary | ICD-10-CM

## 2021-01-01 LAB — CBC WITH DIFFERENTIAL/PLATELET
Abs Immature Granulocytes: 0.02 10*3/uL (ref 0.00–0.07)
Basophils Absolute: 0 10*3/uL (ref 0.0–0.1)
Basophils Relative: 1 %
Eosinophils Absolute: 0 10*3/uL (ref 0.0–0.5)
Eosinophils Relative: 0 %
HCT: 39.1 % (ref 36.0–46.0)
Hemoglobin: 12.8 g/dL (ref 12.0–15.0)
Immature Granulocytes: 1 %
Lymphocytes Relative: 31 %
Lymphs Abs: 1.3 10*3/uL (ref 0.7–4.0)
MCH: 28.3 pg (ref 26.0–34.0)
MCHC: 32.7 g/dL (ref 30.0–36.0)
MCV: 86.3 fL (ref 80.0–100.0)
Monocytes Absolute: 0.3 10*3/uL (ref 0.1–1.0)
Monocytes Relative: 6 %
Neutro Abs: 2.6 10*3/uL (ref 1.7–7.7)
Neutrophils Relative %: 61 %
Platelets: 183 10*3/uL (ref 150–400)
RBC: 4.53 MIL/uL (ref 3.87–5.11)
RDW: 14.7 % (ref 11.5–15.5)
WBC: 4.2 10*3/uL (ref 4.0–10.5)
nRBC: 0 % (ref 0.0–0.2)

## 2021-01-01 LAB — COMPREHENSIVE METABOLIC PANEL
ALT: 14 U/L (ref 0–44)
AST: 14 U/L — ABNORMAL LOW (ref 15–41)
Albumin: 3.5 g/dL (ref 3.5–5.0)
Alkaline Phosphatase: 67 U/L (ref 38–126)
Anion gap: 9 (ref 5–15)
BUN: 21 mg/dL (ref 8–23)
CO2: 21 mmol/L — ABNORMAL LOW (ref 22–32)
Calcium: 9 mg/dL (ref 8.9–10.3)
Chloride: 115 mmol/L — ABNORMAL HIGH (ref 98–111)
Creatinine, Ser: 1.21 mg/dL — ABNORMAL HIGH (ref 0.44–1.00)
GFR, Estimated: 48 mL/min — ABNORMAL LOW (ref >60)
Glucose, Bld: 89 mg/dL (ref 70–99)
Potassium: 3.2 mmol/L — ABNORMAL LOW (ref 3.5–5.1)
Sodium: 145 mmol/L (ref 135–145)
Total Bilirubin: 0.6 mg/dL (ref 0.3–1.2)
Total Protein: 6.3 g/dL — ABNORMAL LOW (ref 6.5–8.1)

## 2021-01-01 LAB — TOTAL PROTEIN, URINE DIPSTICK: Protein, ur: 30 mg/dL — AB

## 2021-01-01 LAB — LIPID PANEL
Cholesterol: 153 mg/dL (ref 0–200)
HDL: 42 mg/dL (ref >40)
LDL Cholesterol: 89 mg/dL (ref 0–99)
Total CHOL/HDL Ratio: 3.6 RATIO
Triglycerides: 108 mg/dL (ref <150)
VLDL: 22 mg/dL (ref 0–40)

## 2021-01-01 LAB — VITAMIN D 25 HYDROXY (VIT D DEFICIENCY, FRACTURES): Vit D, 25-Hydroxy: 47.85 ng/mL (ref 30–100)

## 2021-01-01 LAB — TSH: TSH: 6.795 u[IU]/mL — ABNORMAL HIGH (ref 0.308–3.960)

## 2021-01-01 MED ORDER — SODIUM CHLORIDE 0.9 % IV SOLN
200.0000 mg | Freq: Once | INTRAVENOUS | Status: AC
Start: 2021-01-01 — End: 2021-01-01
  Administered 2021-01-01: 200 mg via INTRAVENOUS
  Filled 2021-01-01: qty 8

## 2021-01-01 MED ORDER — SODIUM CHLORIDE 0.9 % IV SOLN: Freq: Once | INTRAVENOUS | Status: AC

## 2021-01-01 MED ORDER — LEVOTHYROXINE SODIUM 150 MCG PO TABS: 150.0000 ug | ORAL_TABLET | Freq: Every day | ORAL | 1 refills | Status: DC

## 2021-01-01 MED ORDER — SODIUM CHLORIDE 0.9% FLUSH
10.0000 mL | INTRAVENOUS | Status: DC | PRN
Start: 2021-01-01 — End: 2021-01-01
  Administered 2021-01-01: 10 mL

## 2021-01-01 MED ORDER — HEPARIN SOD (PORK) LOCK FLUSH 100 UNIT/ML IV SOLN
500.0000 [IU] | Freq: Once | INTRAVENOUS | Status: AC | PRN
  Administered 2021-01-01: 500 [IU]

## 2021-01-01 MED ORDER — SODIUM CHLORIDE 0.9% FLUSH: 10.0000 mL | Freq: Once | INTRAVENOUS | Status: DC

## 2021-01-01 NOTE — Patient Instructions (Addendum)
[facility]  Discharge Instructions: Thank you for choosing [facility] to provide your oncology and hematology care.   If you have a lab appointment with the [facility], please go directly to the [facility] and check in at the registration area.   Wear comfortable clothing and clothing appropriate for easy access to any Portacath or PICC line.   We strive to give you quality time with your provider. You may need to reschedule your appointment if you arrive late (15 or more minutes).  Arriving late affects you and other patients whose appointments are after yours.  Also, if you miss three or more appointments without notifying the office, you may be dismissed from the clinic at the provider's discretion.      For prescription refill requests, have your pharmacy contact our office and allow 72 hours for refills to be completed.    Today you received the following chemotherapy and/or immunotherapy agent: Pembrolizumab (Keytruda)   To help prevent nausea and vomiting after your treatment, we encourage you to take your nausea medication as directed.  BELOW ARE SYMPTOMS THAT SHOULD BE REPORTED IMMEDIATELY: *FEVER GREATER THAN 100.4 F (38 C) OR HIGHER *CHILLS OR SWEATING *NAUSEA AND VOMITING THAT IS NOT CONTROLLED WITH YOUR NAUSEA MEDICATION *UNUSUAL SHORTNESS OF BREATH *UNUSUAL BRUISING OR BLEEDING *URINARY PROBLEMS (pain or burning when urinating, or frequent urination) *BOWEL PROBLEMS (unusual diarrhea, constipation, pain near the anus) TENDERNESS IN MOUTH AND THROAT WITH OR WITHOUT PRESENCE OF ULCERS (sore throat, sores in mouth, or a toothache) UNUSUAL RASH, SWELLING OR PAIN  UNUSUAL VAGINAL DISCHARGE OR ITCHING   Items with * indicate a potential emergency and should be followed up as soon as possible or go to the Emergency Department if any problems should occur.  Please show the CHEMOTHERAPY ALERT CARD or IMMUNOTHERAPY ALERT CARD at  check-in to the Emergency Department and triage nurse.  Should you have questions after your visit or need to cancel or reschedule your appointment, please contact [facility]  Dept: [phone]  and follow the prompts.  Office hours are 8:00 a.m. to 4:30 p.m. Monday - Friday. Please note that voicemails left after 4:00 p.m. may not be returned until the following business day.  We are closed weekends and major holidays. You have access to a nurse at all times for urgent questions. Please call the main number to the clinic Dept: [phone] and follow the prompts.   For any non-urgent questions, you may also contact your provider using MyChart. We now offer e-Visits for anyone 56 and older to request care online for non-urgent symptoms. For details visit [email or web address].   Also download the MyChart app! Go to the app store, search "MyChart", open the app, select [facility], and log in with your MyChart username and password.  Due to Covid, a mask is required upon entering the hospital/clinic. If you do not have a mask, one will be given to you upon arrival. For doctor visits, patients may have 1 support person aged 35 or older with them. For treatment visits, patients cannot have anyone with them due to current Covid guidelines and our immunocompromised population.   Per Dr. Alvy Bimler, increase Synthroid to 150 mcg per day. Take as directed.

## 2021-01-01 NOTE — Assessment & Plan Note (Signed)
She has fluctuation of BP I will defer to her primary care for further management

## 2021-01-01 NOTE — Progress Notes (Signed)
[place] OFFICE PROGRESS NOTE  Patient Care Team: Jonathon Jordan, MD as PCP - General (Family Medicine) Awanda Mink Craige Cotta, RN as Registered Nurse (Oncology)  ASSESSMENT & PLAN:  Endometrial cancer [facility]) Overall, her exam is benign Her CT imaging show persistent right axillary lymphadenopathy but overall stable in size; this is not palpable on clinical exam She tolerated treatment very well without major side effects except hypertension We will continue treatment as scheduled I plan to space out her imaging study to every 6 months, her next imaging will be in December  Acquired hypothyroidism TSH is intermittently elevated I recommend increasing the dose of Synthroid a little bit  Essential hypertension She has fluctuation of BP I will defer to her primary care for further management  Orders Placed This Encounter  Procedures   Lipid panel    Standing Status:   Future    Number of Occurrences:   1    Standing Expiration Date:   01/31/2021   Vitamin D 25 hydroxy    Standing Status:   Future    Number of Occurrences:   1    Standing Expiration Date:   01/31/2021    All questions were answered. The patient knows to call the clinic with any problems, questions or concerns. The total time spent in the appointment was 20 minutes encounter with patients including review of chart and various tests results, discussions about plan of care and coordination of care plan   [name], MD 01/01/2021 11:51 AM  INTERVAL HISTORY: Please see below for problem oriented charting. she returns for treatment follow-up for recurrent uterine cancer, on Lenvima and pembrolizumab She is doing well except for intermittent elevated blood pressure She denies abdominal bloating, nausea or changes in bowel habits  REVIEW OF SYSTEMS:   Constitutional: Denies fevers, chills or abnormal weight loss Eyes: Denies blurriness of vision Ears, nose, mouth, throat, and face: Denies mucositis or  sore throat Respiratory: Denies cough, dyspnea or wheezes Cardiovascular: Denies palpitation, chest discomfort or lower extremity swelling Gastrointestinal:  Denies nausea, heartburn or change in bowel habits Skin: Denies abnormal skin rashes Lymphatics: Denies new lymphadenopathy or easy bruising Neurological:Denies numbness, tingling or new weaknesses Behavioral/Psych: Mood is stable, no new changes  All other systems were reviewed with the patient and are negative.  I have reviewed the past medical history, past surgical history, social history and family history with the patient and they are unchanged from previous note.  ALLERGIES:  is allergic to sulfa antibiotics and sulfamethoxazole.  MEDICATIONS:  Current Outpatient Medications  Medication Sig Dispense Refill   amLODipine-valsartan (EXFORGE) 5-160 MG tablet Take 1 tablet by mouth daily.     Calcium Carb-Cholecalciferol 600-800 MG-UNIT TABS Take 1 tablet by mouth daily.     ergocalciferol (VITAMIN D2) 1.25 MG (50000 UT) capsule Take 1 capsule (50,000 Units total) by mouth once a week. 12 capsule 1   lenvatinib 10 mg daily dose (LENVIMA, 10 MG DAILY DOSE,) capsule Take 1 capsule (10 mg total) by mouth daily. 30 capsule 11   levothyroxine (SYNTHROID) 150 MCG tablet Take 1 tablet (150 mcg total) by mouth daily before breakfast. 30 tablet 1   lidocaine-prilocaine (EMLA) cream Apply topically daily as needed. 30 g 3   OneTouch Delica Lancets 06C MISC 1 each by Other route 2 (two) times daily. Use to monitor glucose levels BID; E11.65 100 each 2   ONETOUCH VERIO test strip 1 each by Other route 3 (three) times daily. And lancets 3/day  300 each 3   RELION PEN NEEDLES 32G X 4 MM MISC USE 1 PEN PER DAY 50 each 5   simvastatin (ZOCOR) 10 MG tablet Take 10 mg by mouth at bedtime.      No current facility-administered medications for this visit.   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 Leo Grosser who obtained an endometrial biopsy showing poorly differentiated endometrial carcinoma and negative endocervical curettage   12/12/2017 Imaging   MAMMOGRAM FINDINGS: In the right axilla, a possible mass warrants further evaluation. In the left breast, no findings suspicious for malignancy.   Images were processed with CAD.   IMPRESSION: Further evaluation is suggested for possible mass in the right axilla.     12/24/2017 Imaging   Ct scan chest, abdomen and pelvis 1. Marked thickening of the endometrium (42 mm) compatible with known primary endometrial malignancy. No evidence of extrauterine invasion. 2. No pelvic or retroperitoneal adenopathy. 3. Right middle lobe 4 mm solid pulmonary nodule, for which follow-up chest CT is advised in 3-6 months. 4. Several findings that are equivocal for distant metastatic disease. Vaguely nodular heterogeneous hyperenhancement in the peripheral right liver lobe, which could represent benign transient perfusional phenomena, with underlying liver lesions not entirely excluded. Mildly sclerotic T12 vertebral lesion. Mildly enlarged right axillary lymph node. The best single test to further evaluate these findings would be a PET-CT. Alternative tests include bone scan or thoracic MRI without and with IV contrast for the T12 osseous lesion, MRI abdomen without and with IV contrast for the liver findings, and diagnostic mammographic evaluation for the right axillary node. 5.  Aortic  Atherosclerosis (ICD10-I70.0).     01/09/2018 PET scan   1. Moderate hypermetabolism corresponding to enlarging axillary node since 12/22/2017. Highly suspicious for an atypical distribution of metastatic disease. 2. No hypermetabolism to suggest hepatic or T12 osseous metastasis. 3. Hypermetabolic endometrial primary.   01/23/2018 Initial Diagnosis   Endometrial cancer ([place])   01/23/2018 Pathology Results   1. Lymph node, sentinel, biopsy, left external iliac - NO CARCINOMA IDENTIFIED IN ONE LYMPH NODE (0/1) - SEE COMMENT 2. Lymph nodes, regional resection, right para aortic - NO CARCINOMA IDENTIFIED IN FOUR LYMPH NODES (0/4) - SEE COMMENT 3. Lymph nodes, regional resection, right pelvic - NO CARCINOMA IDENTIFIED IN EIGHT LYMPH NODES (0/8) - SEE COMMENT 4. Cul-de-sac biopsy - METASTATIC CARCINOMA 5. Uterus +/- tubes/ovaries, neoplastic, cervix, bilateral fallopian tubes and ovaries UTERUS: - MIXED SEROUS AND ENDOMETRIOID CARCINOMA - SEROSAL IMPLANTS PRESENT - LYMPHOVASCULAR SPACE INVASION PRESENT - LEIOMYOMATA (1.5 CM; LARGEST) - SEE ONCOLOGY TABLE AND COMMENT BELOW CERVIX: - BENIGN NABOTHIAN CYSTS - NO CARCINOMA IDENTIFIED BILATERAL OVARIES: - METASTATIC CARCINOMA PRESENT ON OVARIAN SURFACE BILATERAL FALLOPIAN TUBES: - INTRALUMINAL CARCINOMA Microscopic Comment 1. -3. Cytokeratin AE1/3 was performed on the sentinel lymph nodes to exclude micrometastasis. There is no evidence of metastatic carcinoma by immunohistochemistry. 5. 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%) Histologic Grade: N/A Myometrial invasion: Estimated less than 50% myometrial invasion (0.3 cm of myometrium involved; 1.4 cm measured thickness) Uterine Serosa Involvement:  Present Cervical stromal involvement: Not identified Extent of involvement of other organs: - Fallopian tube (left within the lumen) - Ovary, left (surface involvement) - Cul-de-sac Lymphovascular invasion: Present Regional Lymph Nodes: Examined: 1 Sentinel 12 Non-sentinel 13 Total Lymph nodes with metastasis: 0 Isolated tumor cells (< 0.2 mm): 0 Micrometastasis: (> 0.2 mm and < 2.0 mm): 0 Macrometastasis: (> 2.0 mm): 0 Extracapsular extension: N/A Tumor block for ancillary studies: 5E, 5B MMR / MSI testing: Pending will be reported separately Pathologic Stage Classification (pTNM, AJCC 8th edition): pT3a, pN0 FIGO Stage: IIIA COMMENT: There is tumor present on the surface of the left ovary and within the lumen of the left fallopian tube. The carcinoma appears to be mixed with the largest component being high grade serous carcinoma. Dr. Lyndon Code reviewed the case and agrees with the above diagnosis.   01/23/2018 Surgery   Surgeon: Donaciano Eva     Operation: Robotic-assisted laparoscopic total hysterectomy with bilateral salpingoophorectomy, SLN injection, mapping and biopsy, right pelvic and para-aortic lymphadenectomy   Operative Findings:  : 10-12cm bulky uterus with frank serosal involvement on posterior cul de sac peritoneum and anterior peritoneum which adhesed the bladder to the anterior uterus. Unilateral mapping on left pelvis. No grossly suspicious nodes. Normal omentum and diaphragms.    02/01/2018 Pathology Results   Lymph node, needle/core biopsy, right axilla - METASTATIC CARCINOMA - SEE COMMENT Microscopic Comment The neoplastic cells are positive for cytokeratin 7 and Pax-8 but negative for cytokeratin 5/6, cytokeratin 20, Gata-3, p63, p53 and GCDFP. Overall, the immunoprofile is consistent with metastasis from the patient's known gynecologic carcinoma.   02/01/2018 Procedure   Ultrasound-guided core biopsies of a suspicious right axillary lymph node.    02/21/2018 - 06/13/2018 Chemotherapy   The patient had carboplatin & Taxol x 6   03/26/2018 - 04/30/2018 Radiation Therapy   Radiation treatment dates:   03/26/18, 04/02/18, 04/19/18, 04/23/18 04/30/18   Site/dose: proximal Vagina, 6 Gy in 5 fractions for a total dose of 30 Gy     04/26/2018 PET scan   1. Interval resection of the hypermetabolic endometrial primary. No discernible hypermetabolic pelvic sidewall or peritoneal metastases. 2. Stable hypermetabolic bilateral axillary lymphadenopathy. The degree of hypermetabolism in these lymph nodes remains highly suspicious for neoplasm. 3. Interval development of low level FDG uptake in 2 stable, small lymph nodes in the left groin region. This may be reactive, but close attention recommended to exclude metastatic involvement. 4. Stable non hypermetabolic low-density liver lesions and the mixed lucent and sclerotic T12 lesion is stable without hypermetabolism today.   07/09/2018 Imaging   Status post hysterectomy.   Mild axillary lymphadenopathy, improved. Nodal metastases not excluded.   Irregular wall thickening involving the anterior bladder, possibly reflecting radiation cystitis versus tumor.   Additional stable findings as above, including a 5 mm right middle lobe nodule and stable sclerosis involving the T12 vertebral body.   10/10/2018 Imaging   1. Stable exam. No findings within the abdomen or pelvis to suggest metastatic disease. 2. Unchanged appearance of sclerosis involving the T12 vertebra. 3.  Aortic Atherosclerosis (ICD10-I70.0).   10/10/2018 Imaging   Ct abdomen and pelvis 1. Stable exam. No findings within the abdomen or pelvis to suggest metastatic disease. 2. Unchanged appearance of sclerosis involving  the T12 vertebra. 3.  Aortic Atherosclerosis (ICD10-I70.0).   01/16/2019 PET scan   1. Enlargement and increased activity in the left axillary, right axillary, and left inguinal adenopathy compatible with progressive  malignancy. 2. Stable 4 mm right middle lobe subpleural nodule, not appreciably hypermetabolic. 3. Other imaging findings of potential clinical significance: Right kidney lower pole cyst. Aortic Atherosclerosis (ICD10-I70.0). Prominent stool throughout the colon favors constipation. Mild chronic left maxillary sinusitis.     02/01/2019 -  Chemotherapy   The patient had pembrolizumab and Lenvima for chemotherapy treatment.     04/25/2019 Imaging   Ct imaging 1. Interval response to therapy. Decrease in size of bilateral axillary and left inguinal lymph nodes. No new or progressive findings identified. 2. Stable sclerotic appearance of the T12 vertebra. 3. Aortic atherosclerosis. Lad coronary artery calcification noted.   08/30/2019 Imaging   1. Bulky RIGHT hilar lymph node, slightly increased in size from previous imaging. 2. Multiple foci of hyperenhancement in the liver. 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 Consider MRI liver on follow-up to establish a baseline for number of lesions as these will appear variable on CT follow-up based on phase of contrast acquisition in the future. 3. Stable 5 mm nodule adjacent to the fissure, minor fissure in the RIGHT middle lobe.     11/28/2019 Imaging   1. Stable exam. No new or progressive findings. 2. Stable enlarged right axillary lymph node. 3. Stable tiny bilateral pulmonary nodules. Continued attention on follow-up recommended. 4. Scattered hypodensities in the liver parenchyma correspond to the hypervascular lesions seen on the previous study performed with intravenous contrast material. 5. Right renal cyst. 6. Aortic Atherosclerosis (ICD10-I70.0).     03/16/2020 Imaging   Increased echogenicity of renal parenchyma is noted bilaterally  suggesting medical renal disease. No hydronephrosis or renal obstruction is noted   04/02/2020 Imaging   1. Right axillary lymphadenopathy is stable. Tiny bilateral pulmonary nodules are stable. Subcentimeter low-attenuation liver lesions are stable. 2. No new or progressive metastatic disease in the chest, abdomen or pelvis. 3. Aortic Atherosclerosis (ICD10-I70.0).   09/15/2020 Imaging   1. Stable RIGHT axillary lymph node with enlargement measuring 2 cm short axis. 2. Stable small nodule along the minor fissure in the RIGHT chest. 3. No new or progressive findings. 4. Aortic atherosclerosis.     PHYSICAL EXAMINATION: ECOG PERFORMANCE STATUS: 1 - Symptomatic but completely ambulatory  Vitals:   01/01/21 0917  BP: (!) 183/105  Pulse: 81  Resp: 18  Temp: (!) 97.2 F (36.2 C)  SpO2: 100%   Filed Weights   01/01/21 0917  Weight: 158 lb 3.2 oz (71.8 kg)    GENERAL:alert, no distress and comfortable SKIN: skin color, texture, turgor are normal, no rashes or significant lesions EYES: normal, Conjunctiva are pink and non-injected, sclera clear OROPHARYNX:no exudate, no erythema and lips, buccal mucosa, and tongue normal  NECK: supple, thyroid normal size, non-tender, without nodularity LYMPH:  no palpable lymphadenopathy in the cervical, axillary or inguinal LUNGS: clear to auscultation and percussion with normal breathing effort HEART: regular rate & rhythm and no murmurs and no lower extremity edema ABDOMEN:abdomen soft, non-tender and normal bowel sounds Musculoskeletal:no cyanosis of digits and no clubbing  NEURO: alert & oriented x 3 with fluent speech, no focal motor/sensory deficits  LABORATORY DATA:  I have reviewed the data as listed    Component Value Date/Time   NA 145 01/01/2021 0905   K 3.2 (L) 01/01/2021 0905   CL 115 (H) 01/01/2021 0905   CO2 21 (L) 01/01/2021 0905   GLUCOSE 89 01/01/2021 0905   BUN 21 01/01/2021 0905   CREATININE 1.21 (H) 01/01/2021 0905    CREATININE 1.75 (H) 01/10/2020 1200   CALCIUM 9.0 01/01/2021 0905   PROT 6.3 (L) 01/01/2021 0905   ALBUMIN 3.5 01/01/2021 0905   AST 14 (L) 01/01/2021 0905   AST 13 (L) 01/10/2020 1200   ALT 14 01/01/2021 0905   ALT 11 01/10/2020 1200   ALKPHOS 67 01/01/2021 0905   BILITOT 0.6 01/01/2021 0905   BILITOT 0.4 01/10/2020 1200   GFRNONAA 48 (L) 01/01/2021 0905   GFRNONAA 29 (L) 01/10/2020 1200   GFRAA 34 (L) 01/10/2020 1200    No results found for: SPEP, UPEP  Lab Results  Component Value Date   WBC 4.2 01/01/2021   NEUTROABS 2.6 01/01/2021   HGB 12.8 01/01/2021   HCT 39.1 01/01/2021   MCV 86.3 01/01/2021   PLT 183 01/01/2021      Chemistry      Component Value Date/Time   NA 145 01/01/2021 0905   K 3.2 (L) 01/01/2021 0905   CL 115 (H) 01/01/2021 0905   CO2 21 (L) 01/01/2021 0905   BUN 21 01/01/2021 0905   CREATININE 1.21 (H) 01/01/2021 0905   CREATININE 1.75 (H) 01/10/2020 1200      Component Value Date/Time   CALCIUM 9.0 01/01/2021 0905   ALKPHOS 67 01/01/2021 0905   AST 14 (L) 01/01/2021 0905   AST 13 (L) 01/10/2020 1200   ALT 14 01/01/2021 0905   ALT 11 01/10/2020 1200   BILITOT 0.6 01/01/2021 0905   BILITOT 0.4 01/10/2020 1200

## 2021-01-01 NOTE — Assessment & Plan Note (Signed)
Overall, her exam is benign Her CT imaging show persistent right axillary lymphadenopathy but overall stable in size; this is not palpable on clinical exam She tolerated treatment very well without major side effects except hypertension We will continue treatment as scheduled I plan to space out her imaging study to every 6 months, her next imaging will be in December

## 2021-01-01 NOTE — Assessment & Plan Note (Signed)
TSH is intermittently elevated I recommend increasing the dose of Synthroid a little bit

## 2021-01-04 ENCOUNTER — Telehealth: Payer: Self-pay | Admitting: *Deleted

## 2021-01-04 NOTE — Telephone Encounter (Signed)
Pt Vitamin D and Lipid Panel was faxed to [phone] at [facility]. Confirmation fax received.

## 2021-01-05 ENCOUNTER — Other Ambulatory Visit: Payer: Self-pay

## 2021-01-05 ENCOUNTER — Telehealth: Payer: Self-pay | Admitting: Endocrinology

## 2021-01-05 DIAGNOSIS — E559 Vitamin D deficiency, unspecified: Secondary | ICD-10-CM

## 2021-01-05 MED ORDER — ERGOCALCIFEROL 1.25 MG (50000 UT) PO CAPS: 50000.0000 [IU] | ORAL_CAPSULE | ORAL | 3 refills | Status: DC

## 2021-01-05 NOTE — Telephone Encounter (Signed)
Pt calling in voiced that she had a check up with her pcp and states that her D2 levels were good.pt is wondering if Dr.Ellison wanted pt to keep taking it. If so pt would need a refill  ergocalciferol (VITAMIN D2) 1.25 MG (50000 UT) capsule  Pt took her last one Saturday 01/02/2021  [place], [address]

## 2021-01-06 ENCOUNTER — Other Ambulatory Visit: Payer: Self-pay | Admitting: Family Medicine

## 2021-01-06 DIAGNOSIS — E2839 Other primary ovarian failure: Secondary | ICD-10-CM

## 2021-01-06 DIAGNOSIS — Z1231 Encounter for screening mammogram for malignant neoplasm of breast: Secondary | ICD-10-CM

## 2021-01-15 ENCOUNTER — Ambulatory Visit (INDEPENDENT_AMBULATORY_CARE_PROVIDER_SITE_OTHER): Payer: Medicare Other | Admitting: Podiatry

## 2021-01-15 ENCOUNTER — Other Ambulatory Visit: Payer: Self-pay

## 2021-01-15 ENCOUNTER — Encounter: Payer: Self-pay | Admitting: Podiatry

## 2021-01-15 DIAGNOSIS — Z794 Long term (current) use of insulin: Secondary | ICD-10-CM | POA: Diagnosis not present

## 2021-01-15 DIAGNOSIS — N183 Chronic kidney disease, stage 3 unspecified: Secondary | ICD-10-CM

## 2021-01-15 DIAGNOSIS — L84 Corns and callosities: Secondary | ICD-10-CM

## 2021-01-15 DIAGNOSIS — B351 Tinea unguium: Secondary | ICD-10-CM | POA: Diagnosis not present

## 2021-01-15 DIAGNOSIS — M79674 Pain in right toe(s): Secondary | ICD-10-CM

## 2021-01-15 DIAGNOSIS — Q828 Other specified congenital malformations of skin: Secondary | ICD-10-CM | POA: Diagnosis not present

## 2021-01-15 DIAGNOSIS — E0822 Diabetes mellitus due to underlying condition with diabetic chronic kidney disease: Secondary | ICD-10-CM | POA: Diagnosis not present

## 2021-01-15 DIAGNOSIS — M79675 Pain in left toe(s): Secondary | ICD-10-CM

## 2021-01-15 NOTE — Progress Notes (Signed)
  Subjective:  Patient ID: Krista Johnson, female    DOB: 02/23/1951,  MRN: [id number]  Krista Johnson presents to clinic today for at risk foot care. Pt has h/o NIDDM with chronic kidney disease and painful porokeratotic lesion(s) b/l feet and painful mycotic toenails that limit ambulation. Painful toenails interfere with ambulation. Aggravating factors include wearing enclosed shoe gear. Pain is relieved with periodic professional debridement. Painful porokeratotic lesions are aggravated when weightbearing with and without shoegear. Pain is relieved with periodic professional debridement.  Patient states blood glucose ranges 90's-106 mg/dl in the morning, 120's mg/dl midday, and 105 mg/dl in the evening.  PCP is Jonathon Jordan, MD , and last visit was 05/12/2020.  Allergies  Allergen Reactions   Sulfa Antibiotics Nausea Only   Sulfamethoxazole Nausea Only    Review of Systems: Negative except as noted in the HPI. Objective:   Constitutional Krista Johnson is a pleasant 70 y.o. African American female, WD, WN in NAD. AAO x 3.   Vascular Capillary refill time to digits immediate b/l. Palpable DP pulse(s) b/l lower extremities Palpable PT pulse(s) b/l lower extremities Pedal hair sparse. Lower extremity skin temperature gradient within normal limits. No pain with calf compression b/l. No edema noted b/l lower extremities. No cyanosis or clubbing noted.  Neurologic Normal speech. Oriented to person, place, and time. Pt has subjective symptoms of neuropathy. Protective sensation intact 5/5 intact bilaterally with 10g monofilament b/l. Vibratory sensation intact b/l.  Dermatologic Pedal skin with normal turgor, texture and tone b/l lower extremities. No open wounds b/l lower extremities. No interdigital macerations b/l lower extremities. Toenails 1-5 b/l elongated, discolored, dystrophic, thickened, crumbly with subungual debris and tenderness to dorsal palpation. Hyperkeratotic lesion(s) L 5th  toe, R 5th toe, and sub 5th met base right foot.  No erythema, no edema, no drainage, no fluctuance. Porokeratotic lesion(s) submet head 4 left foot and submet head 5 right foot. No erythema, no edema, no drainage, no fluctuance.  Orthopedic: Normal muscle strength 5/5 to all lower extremity muscle groups bilaterally. No pain crepitus or joint limitation noted with ROM b/l. Hammertoe(s) noted to the L 5th toe and R 5th toe.   Radiographs: None Assessment:   1. Pain due to onychomycosis of toenails of both feet   2. Porokeratosis   3. Corns and callosities   4. Diabetes mellitus due to underlying condition with stage 3 chronic kidney disease, with long-term current use of insulin, unspecified whether stage 3a or 3b CKD ([place])    Plan:  -Examined patient. -Continue diabetic foot care principles: inspect feet daily, monitor glucose as recommended by PCP and/or Endocrinologist, and follow prescribed diet per PCP, Endocrinologist and/or dietician. -Patient to continue soft, supportive shoe gear daily. -Toenails 1-5 b/l were debrided in length and girth with sterile nail nippers and dremel without iatrogenic bleeding.  -Corn(s) L 5th toe and R 5th toe pared utilizing sterile scalpel blade without complication or incident. Total number debrided=2. -Callus(es) submet head 5 left foot and sub 5th met base right foot pared utilizing sterile scalpel blade without complication or incident. Total number debrided =2. -Painful porokeratotic lesion(s) submet head 4 left foot pared and enucleated with sterile scalpel blade without incident. Total number of lesions debrided=1. -Patient to report any pedal injuries to medical professional immediately. -Patient/POA to call should there be question/concern in the interim.  Return in about 3 months (around 04/16/2021).  Marzetta Board, DPM

## 2021-01-21 ENCOUNTER — Other Ambulatory Visit: Payer: Self-pay

## 2021-01-21 ENCOUNTER — Ambulatory Visit (INDEPENDENT_AMBULATORY_CARE_PROVIDER_SITE_OTHER): Payer: Medicare Other | Admitting: Endocrinology

## 2021-01-21 VITALS — BP 130/90 | HR 107 | Ht 66.0 in | Wt 158.6 lb

## 2021-01-21 DIAGNOSIS — E1122 Type 2 diabetes mellitus with diabetic chronic kidney disease: Secondary | ICD-10-CM

## 2021-01-21 DIAGNOSIS — N1831 Chronic kidney disease, stage 3a: Secondary | ICD-10-CM

## 2021-01-21 DIAGNOSIS — Z794 Long term (current) use of insulin: Secondary | ICD-10-CM

## 2021-01-21 LAB — POCT GLYCOSYLATED HEMOGLOBIN (HGB A1C): Hemoglobin A1C: 5.8 % — AB (ref 4.0–5.6)

## 2021-01-21 NOTE — Patient Instructions (Addendum)
No medication is needed for the blood sugar. check your blood sugar 3 times a day.  vary the time of day when you check, between before the 3 meals, and at bedtime.  also check if you have symptoms of your blood sugar being too high or too low.  please keep a record of the readings and bring it to your next appointment here (or you can bring the meter itself).  You can write it on any piece of paper.  please call us sooner if your blood sugar goes below 70, or if you have a lot of readings over 200.    Please continue the same Vitamin-D.   Blood tests are requested for you today.  If you want to have checked at the cancer center tomorrow, they would need to put it back in.   Please come back for a follow-up appointment in 4 months.

## 2021-01-21 NOTE — Progress Notes (Signed)
Subjective:    Patient ID: Krista Johnson, female    DOB: 1950-12-30, 70 y.o.   MRN: [id number]  HPI Pt returns for f/u of diabetes mellitus:  DM type: 2   Dx'ed: 9518 Complications: stage 3 CRI Therapy: no medication now GDM: never (G0) DKA: never Severe hypoglycemia: never Pancreatitis: never Pancreatic imaging: normal on 2019 CT.   Other: she took insulin 2019-2022; She is finished with her IV chemo; fructosamine has confirmed A1c.   Interval history: pt states cbg's are well-controlled.   Pt also has vit-D def and Hyperparathyroidism (prob a combination of primary and secondary).  She takes vit-D, 50000 units/week  No recent steroids.   Past Medical History:  Diagnosis Date   Arthritis    right knee--- last cortisone injection 04/ 2019   CKD (chronic kidney disease)    Diabetes mellitus without complication [facility])    endocrinologist-  dr Loanne Drilling   Endometrial cancer [facility])    History of colon polyps    Hyperlipidemia    Hyperparathyroidism ([place])    Hypertension     Past Surgical History:  Procedure Laterality Date   COLONOSCOPY  last one 12-25-2017   IR IMAGING GUIDED PORT INSERTION  02/20/2018   ROBOTIC ASSISTED TOTAL HYSTERECTOMY WITH BILATERAL SALPINGO OOPHERECTOMY N/A 01/23/2018   Procedure: XI ROBOTIC ASSISTED TOTAL HYSTERECTOMY WITH BILATERAL SALPINGO OOPHORECTOMY;  Surgeon: Everitt Amber, MD;  Location: [facility];  Service: Gynecology;  Laterality: N/A;   SENTINEL NODE BIOPSY N/A 01/23/2018   Procedure: SENTINEL NODE BIOPSY;  Surgeon: Everitt Amber, MD;  Location: [facility];  Service: Gynecology;  Laterality: N/A;    Social History   Socioeconomic History   Marital status: Single    Spouse name: Not on file   Number of children: 0   Years of education: Not on file   Highest education level: Not on file  Occupational History   Occupation: retired Education officer, [employer]  Tobacco Use   Smoking status: Never   Smokeless tobacco: Never  Vaping Use   Vaping Use: Never used   Substance and Sexual Activity   Alcohol use: Not Currently    Alcohol/week: 0.0 standard drinks   Drug use: Never   Sexual activity: Not on file  Other Topics Concern   Not on file  Social History Narrative   Not on file   Social Determinants of Health   Financial Resource Strain: Not on file  Food Insecurity: Not on file  Transportation Needs: Not on file  Physical Activity: Not on file  Stress: Not on file  Social Connections: Not on file  Intimate Partner Violence: Not on file    Current Outpatient Medications on File Prior to Visit  Medication Sig Dispense Refill   amLODipine-valsartan (EXFORGE) 5-160 MG tablet Take 1 tablet by mouth daily.     Calcium Carb-Cholecalciferol 600-800 MG-UNIT TABS Take 1 tablet by mouth daily.     ergocalciferol (VITAMIN D2) 1.25 MG (50000 UT) capsule Take 1 capsule (50,000 Units total) by mouth once a week. 12 capsule 3   lenvatinib 10 mg daily dose (LENVIMA, 10 MG DAILY DOSE,) capsule Take 1 capsule (10 mg total) by mouth daily. 30 capsule 11   levothyroxine (SYNTHROID) 150 MCG tablet Take 1 tablet (150 mcg total) by mouth daily before breakfast. 30 tablet 1   lidocaine-prilocaine (EMLA) cream Apply topically daily as needed. 30 g 3   OneTouch Delica Lancets 84Z MISC 1 each by Other route 2 (two) times daily. Use to monitor glucose  levels BID; E11.65 100 each 2   ONETOUCH VERIO test strip 1 each by Other route 3 (three) times daily. And lancets 3/day 300 each 3   RELION PEN NEEDLES 32G X 4 MM MISC USE 1 PEN PER DAY 50 each 5   simvastatin (ZOCOR) 10 MG tablet Take 10 mg by mouth at bedtime.      No current facility-administered medications on file prior to visit.    Allergies  Allergen Reactions   Sulfa Antibiotics Nausea Only   Sulfamethoxazole Nausea Only    Family History  Problem Relation Age of Onset   Diabetes Mother    Hypertension Mother    Diabetes Sister    Hypertension Sister    Diabetes Maternal Uncle    Colon cancer  Paternal Aunt    Diabetes Paternal Aunt    Stomach cancer Neg Hx    Rectal cancer Neg Hx    Esophageal cancer Neg Hx    Colon polyps Neg Hx     BP 130/90 (BP Location: Right Arm, Patient Position: Sitting, Cuff Size: Normal)   Pulse (!) 107   Ht 5\' 6"  (1.676 m)   Wt 158 lb 9.6 oz (71.9 kg)   SpO2 98%   BMI 25.60 kg/m    Review of Systems     Objective:   Physical Exam Pulses: dorsalis pedis intact bilat.   MSK: no deformity of the feet CV: no leg edema.   Skin:  no ulcer on the feet.  normal color and temp on the feet. Neuro: sensation is intact to touch on the feet.     25-OH Vit-D=47.    A1c=5.8%  Lab Results  Component Value Date   CREATININE 1.21 (H) 01/01/2021   BUN 21 01/01/2021   NA 145 01/01/2021   K 3.2 (L) 01/01/2021   CL 115 (H) 01/01/2021   CO2 21 (L) 01/01/2021   Lab Results  Component Value Date   TSH 2.748 01/22/2021   T4TOTAL 8.3 01/10/2020      Assessment & Plan:  Type 2 DM: stable off rx.  We'll follow Hypothyroidism: well-controlled.  Please continue the same synthroid.   Vit-D def: well-controlled  Patient Instructions  No medication is needed for the blood sugar. check your blood sugar 3 times a day.  vary the time of day when you check, between before the 3 meals, and at bedtime.  also check if you have symptoms of your blood sugar being too high or too low.  please keep a record of the readings and bring it to your next appointment here (or you can bring the meter itself).  You can write it on any piece of paper.  please call us sooner if your blood sugar goes below 70, or if you have a lot of readings over 200.    Please continue the same Vitamin-D.   Blood tests are requested for you today.  If you want to have checked at the cancer center tomorrow, they would need to put it back in.   Please come back for a follow-up appointment in 4 months.

## 2021-01-22 ENCOUNTER — Encounter: Payer: Self-pay | Admitting: Hematology and Oncology

## 2021-01-22 ENCOUNTER — Inpatient Hospital Stay: Payer: Medicare Other | Attending: Gynecology

## 2021-01-22 ENCOUNTER — Inpatient Hospital Stay (HOSPITAL_BASED_OUTPATIENT_CLINIC_OR_DEPARTMENT_OTHER): Payer: Medicare Other | Admitting: Hematology and Oncology

## 2021-01-22 ENCOUNTER — Inpatient Hospital Stay: Payer: Medicare Other

## 2021-01-22 DIAGNOSIS — C541 Malignant neoplasm of endometrium: Secondary | ICD-10-CM

## 2021-01-22 DIAGNOSIS — Z79899 Other long term (current) drug therapy: Secondary | ICD-10-CM | POA: Insufficient documentation

## 2021-01-22 DIAGNOSIS — E039 Hypothyroidism, unspecified: Secondary | ICD-10-CM

## 2021-01-22 DIAGNOSIS — C773 Secondary and unspecified malignant neoplasm of axilla and upper limb lymph nodes: Secondary | ICD-10-CM

## 2021-01-22 DIAGNOSIS — Z7189 Other specified counseling: Secondary | ICD-10-CM

## 2021-01-22 DIAGNOSIS — Z5112 Encounter for antineoplastic immunotherapy: Secondary | ICD-10-CM | POA: Insufficient documentation

## 2021-01-22 DIAGNOSIS — N183 Chronic kidney disease, stage 3 unspecified: Secondary | ICD-10-CM

## 2021-01-22 LAB — CBC WITH DIFFERENTIAL/PLATELET
Abs Immature Granulocytes: 0.02 10*3/uL (ref 0.00–0.07)
Basophils Absolute: 0 10*3/uL (ref 0.0–0.1)
Basophils Relative: 0 %
Eosinophils Absolute: 0 10*3/uL (ref 0.0–0.5)
Eosinophils Relative: 0 %
HCT: 40.5 % (ref 36.0–46.0)
Hemoglobin: 13.1 g/dL (ref 12.0–15.0)
Immature Granulocytes: 0 %
Lymphocytes Relative: 30 %
Lymphs Abs: 1.3 10*3/uL (ref 0.7–4.0)
MCH: 27.9 pg (ref 26.0–34.0)
MCHC: 32.3 g/dL (ref 30.0–36.0)
MCV: 86.4 fL (ref 80.0–100.0)
Monocytes Absolute: 0.3 10*3/uL (ref 0.1–1.0)
Monocytes Relative: 6 %
Neutro Abs: 2.8 10*3/uL (ref 1.7–7.7)
Neutrophils Relative %: 64 %
Platelets: 197 10*3/uL (ref 150–400)
RBC: 4.69 MIL/uL (ref 3.87–5.11)
RDW: 14.6 % (ref 11.5–15.5)
WBC: 4.5 10*3/uL (ref 4.0–10.5)
nRBC: 0 % (ref 0.0–0.2)

## 2021-01-22 LAB — TOTAL PROTEIN, URINE DIPSTICK: Protein, ur: 100 mg/dL — AB

## 2021-01-22 LAB — COMPREHENSIVE METABOLIC PANEL
ALT: 13 U/L (ref 0–44)
AST: 14 U/L — ABNORMAL LOW (ref 15–41)
Albumin: 3.5 g/dL (ref 3.5–5.0)
Alkaline Phosphatase: 69 U/L (ref 38–126)
Anion gap: 10 (ref 5–15)
BUN: 18 mg/dL (ref 8–23)
CO2: 21 mmol/L — ABNORMAL LOW (ref 22–32)
Calcium: 8.9 mg/dL (ref 8.9–10.3)
Chloride: 113 mmol/L — ABNORMAL HIGH (ref 98–111)
Creatinine, Ser: 1.27 mg/dL — ABNORMAL HIGH (ref 0.44–1.00)
GFR, Estimated: 45 mL/min — ABNORMAL LOW (ref >60)
Glucose, Bld: 111 mg/dL — ABNORMAL HIGH (ref 70–99)
Potassium: 3.2 mmol/L — ABNORMAL LOW (ref 3.5–5.1)
Sodium: 144 mmol/L (ref 135–145)
Total Bilirubin: 0.6 mg/dL (ref 0.3–1.2)
Total Protein: 6.4 g/dL — ABNORMAL LOW (ref 6.5–8.1)

## 2021-01-22 LAB — TSH: TSH: 2.748 u[IU]/mL (ref 0.308–3.960)

## 2021-01-22 MED ORDER — SODIUM CHLORIDE 0.9% FLUSH
10.0000 mL | INTRAVENOUS | Status: DC | PRN
  Administered 2021-01-22: 10 mL

## 2021-01-22 MED ORDER — HEPARIN SOD (PORK) LOCK FLUSH 100 UNIT/ML IV SOLN
500.0000 [IU] | Freq: Once | INTRAVENOUS | Status: AC | PRN
Start: 2021-01-22 — End: 2021-01-22
  Administered 2021-01-22: 500 [IU]

## 2021-01-22 MED ORDER — SODIUM CHLORIDE 0.9 % IV SOLN
200.0000 mg | Freq: Once | INTRAVENOUS | Status: AC
  Administered 2021-01-22: 200 mg via INTRAVENOUS
  Filled 2021-01-22: qty 8

## 2021-01-22 MED ORDER — SODIUM CHLORIDE 0.9% FLUSH
10.0000 mL | Freq: Once | INTRAVENOUS | Status: AC
  Administered 2021-01-22: 10 mL

## 2021-01-22 MED ORDER — SODIUM CHLORIDE 0.9 % IV SOLN: Freq: Once | INTRAVENOUS | Status: AC

## 2021-01-22 NOTE — Patient Instructions (Signed)
[facility]  Discharge Instructions: ?Thank you for choosing [facility] to provide your oncology and hematology care.  ? ?If you have a lab appointment with the Cancer Center, please go directly to the Cancer Center and check in at the registration area. ?  ?Wear comfortable clothing and clothing appropriate for easy access to any Portacath or PICC line.  ? ?We strive to give you quality time with your provider. You may need to reschedule your appointment if you arrive late (15 or more minutes).  Arriving late affects you and other patients whose appointments are after yours.  Also, if you miss three or more appointments without notifying the office, you may be dismissed from the clinic at the provider?s discretion.    ?  ?For prescription refill requests, have your pharmacy contact our office and allow 72 hours for refills to be completed.   ? ?Today you received the following chemotherapy and/or immunotherapy agents: Keytruda ?  ?To help prevent nausea and vomiting after your treatment, we encourage you to take your nausea medication as directed. ? ?BELOW ARE SYMPTOMS THAT SHOULD BE REPORTED IMMEDIATELY: ?*FEVER GREATER THAN 100.4 F (38 ?C) OR HIGHER ?*CHILLS OR SWEATING ?*NAUSEA AND VOMITING THAT IS NOT CONTROLLED WITH YOUR NAUSEA MEDICATION ?*UNUSUAL SHORTNESS OF BREATH ?*UNUSUAL BRUISING OR BLEEDING ?*URINARY PROBLEMS (pain or burning when urinating, or frequent urination) ?*BOWEL PROBLEMS (unusual diarrhea, constipation, pain near the anus) ?TENDERNESS IN MOUTH AND THROAT WITH OR WITHOUT PRESENCE OF ULCERS (sore throat, sores in mouth, or a toothache) ?UNUSUAL RASH, SWELLING OR PAIN  ?UNUSUAL VAGINAL DISCHARGE OR ITCHING  ? ?Items with * indicate a potential emergency and should be followed up as soon as possible or go to the Emergency Department if any problems should occur. ? ?Please show the CHEMOTHERAPY ALERT CARD or IMMUNOTHERAPY ALERT CARD at check-in to the  Emergency Department and triage nurse. ? ?Should you have questions after your visit or need to cancel or reschedule your appointment, please contact [facility]  Dept: [phone]  and follow the prompts.  Office hours are 8:00 a.m. to 4:30 p.m. Monday - Friday. Please note that voicemails left after 4:00 p.m. may not be returned until the following business day.  We are closed weekends and major holidays. You have access to a nurse at all times for urgent questions. Please call the main number to the clinic Dept: [phone] and follow the prompts. ? ? ?For any non-urgent questions, you may also contact your provider using MyChart. We now offer e-Visits for anyone 18 and older to request care online for non-urgent symptoms. For details visit [email or web address]. ?  ?Also download the MyChart app! Go to the app store, search "MyChart", open the app, select [facility], and log in with your MyChart username and password. ? ?Due to Covid, a mask is required upon entering the hospital/clinic. If you do not have a mask, one will be given to you upon arrival. For doctor visits, patients may have 1 support person aged 18 or older with them. For treatment visits, patients cannot have anyone with them due to current Covid guidelines and our immunocompromised population.  ? ?

## 2021-01-22 NOTE — Progress Notes (Signed)
[place] OFFICE PROGRESS NOTE  Patient Care Team: Jonathon Jordan, MD as PCP - General (Family Medicine) Awanda Mink Craige Cotta, RN as Registered Nurse (Oncology)  ASSESSMENT & PLAN:  Endometrial cancer [facility]) Overall, her exam is benign Her CT imaging show persistent right axillary lymphadenopathy but overall stable in size; this is not palpable on clinical exam She tolerated treatment very well without major side effects except hypertension We will continue treatment as scheduled I plan to space out her imaging study to every 6 months, her next imaging will be in early December  Chronic kidney disease (CKD), stage III (moderate) (HCC) Recent renal function fluctuates up and down We discussed the importance of aggressive risk factor modification and to drink plenty of oral fluids  Acquired hypothyroidism TSH is intermittently elevated, could be related to side effects of checkpoint inhibitors The dose of her Synthroid was recently modified We will continue to monitor that carefully  No orders of the defined types were placed in this encounter.   All questions were answered. The patient knows to call the clinic with any problems, questions or concerns. The total time spent in the appointment was 20 minutes encounter with patients including review of chart and various tests results, discussions about plan of care and coordination of care plan   [name], MD 01/22/2021 9:13 AM  INTERVAL HISTORY: Please see below for problem oriented charting. she returns for treatment follow-up on pembrolizumab/Lenvima for recurrent uterine cancer She is doing well She have no new side effects from treatment Denies worsening lymphadenopathy No changes in bowel habits With documented blood pressure from home were within normal range  REVIEW OF SYSTEMS:   Constitutional: Denies fevers, chills or abnormal weight loss Eyes: Denies blurriness of vision Ears, nose, mouth, throat, and  face: Denies mucositis or sore throat Respiratory: Denies cough, dyspnea or wheezes Cardiovascular: Denies palpitation, chest discomfort or lower extremity swelling Gastrointestinal:  Denies nausea, heartburn or change in bowel habits Skin: Denies abnormal skin rashes Lymphatics: Denies new lymphadenopathy or easy bruising Neurological:Denies numbness, tingling or new weaknesses Behavioral/Psych: Mood is stable, no new changes  All other systems were reviewed with the patient and are negative.  I have reviewed the past medical history, past surgical history, social history and family history with the patient and they are unchanged from previous note.  ALLERGIES:  is allergic to sulfa antibiotics and sulfamethoxazole.  MEDICATIONS:  Current Outpatient Medications  Medication Sig Dispense Refill   amLODipine-valsartan (EXFORGE) 5-160 MG tablet Take 1 tablet by mouth daily.     Calcium Carb-Cholecalciferol 600-800 MG-UNIT TABS Take 1 tablet by mouth daily.     ergocalciferol (VITAMIN D2) 1.25 MG (50000 UT) capsule Take 1 capsule (50,000 Units total) by mouth once a week. 12 capsule 3   lenvatinib 10 mg daily dose (LENVIMA, 10 MG DAILY DOSE,) capsule Take 1 capsule (10 mg total) by mouth daily. 30 capsule 11   levothyroxine (SYNTHROID) 150 MCG tablet Take 1 tablet (150 mcg total) by mouth daily before breakfast. 30 tablet 1   lidocaine-prilocaine (EMLA) cream Apply topically daily as needed. 30 g 3   OneTouch Delica Lancets 31D MISC 1 each by Other route 2 (two) times daily. Use to monitor glucose levels BID; E11.65 100 each 2   ONETOUCH VERIO test strip 1 each by Other route 3 (three) times daily. And lancets 3/day 300 each 3   RELION PEN NEEDLES 32G X 4 MM MISC USE 1 PEN PER DAY 50 each 5  simvastatin (ZOCOR) 10 MG tablet Take 10 mg by mouth at bedtime.      No current facility-administered medications for this visit.   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 Leo Grosser who obtained an endometrial biopsy showing poorly differentiated endometrial carcinoma and negative endocervical curettage   12/12/2017 Imaging   MAMMOGRAM FINDINGS: In the right axilla, a possible mass warrants further evaluation. In the left breast, no findings suspicious for malignancy.   Images were processed with CAD.   IMPRESSION: Further evaluation is suggested for possible mass in the right axilla.     12/24/2017 Imaging   Ct scan chest, abdomen and pelvis 1. Marked thickening of the endometrium (42 mm) compatible with known primary endometrial malignancy. No evidence of extrauterine invasion. 2. No pelvic or retroperitoneal adenopathy. 3. Right middle lobe 4 mm solid pulmonary nodule, for which follow-up chest CT is advised in 3-6 months. 4. Several findings that are equivocal for distant metastatic disease. Vaguely nodular heterogeneous hyperenhancement in the peripheral right liver lobe, which could represent benign transient perfusional phenomena, with underlying liver lesions not entirely excluded. Mildly sclerotic T12 vertebral lesion. Mildly enlarged right axillary lymph node. The best single test to further evaluate these findings would be a PET-CT. Alternative tests include bone  scan or thoracic MRI without and with IV contrast for the T12 osseous lesion, MRI abdomen without and with IV contrast for the liver findings, and diagnostic mammographic evaluation for the right axillary node. 5.  Aortic Atherosclerosis (ICD10-I70.0).     01/09/2018 PET scan   1. Moderate hypermetabolism corresponding to enlarging axillary node since 12/22/2017. Highly suspicious for an atypical distribution of metastatic disease. 2. No hypermetabolism to suggest hepatic or T12 osseous metastasis. 3. Hypermetabolic endometrial primary.   01/23/2018 Initial Diagnosis   Endometrial cancer ([place])   01/23/2018 Pathology Results   1. Lymph node, sentinel, biopsy, left external iliac - NO CARCINOMA IDENTIFIED IN ONE LYMPH NODE (0/1) - SEE COMMENT 2. Lymph nodes, regional resection, right para aortic - NO CARCINOMA IDENTIFIED IN FOUR LYMPH NODES (0/4) - SEE COMMENT 3. Lymph nodes, regional resection, right pelvic - NO CARCINOMA IDENTIFIED IN EIGHT LYMPH NODES (0/8) - SEE COMMENT 4. Cul-de-sac biopsy - METASTATIC CARCINOMA 5. Uterus +/- tubes/ovaries, neoplastic, cervix, bilateral fallopian tubes and ovaries UTERUS: - MIXED SEROUS AND ENDOMETRIOID CARCINOMA - SEROSAL IMPLANTS PRESENT - LYMPHOVASCULAR SPACE INVASION PRESENT - LEIOMYOMATA (1.5 CM; LARGEST) - SEE ONCOLOGY TABLE AND COMMENT BELOW CERVIX: - BENIGN NABOTHIAN CYSTS - NO CARCINOMA IDENTIFIED BILATERAL OVARIES: - METASTATIC CARCINOMA PRESENT ON OVARIAN SURFACE BILATERAL FALLOPIAN TUBES: - INTRALUMINAL CARCINOMA Microscopic Comment 1. -3. Cytokeratin AE1/3 was performed on the sentinel lymph nodes to exclude micrometastasis. There is no evidence of metastatic  carcinoma by immunohistochemistry. 5. UTERUS, CARCINOMA OR CARCINOSARCOMA  Procedure: Hysterectomy, bilateral salpingo-oophorectomy, peritoneal biopsy, sentinel lymph node biopsy and pelvic lymph node resection Histologic type: Mixed carcinoma composed of serous carcinoma  (~80%) and endometrioid carcinoma (~20%) Histologic Grade: N/A Myometrial invasion: Estimated less than 50% myometrial invasion (0.3 cm of myometrium involved; 1.4 cm measured thickness) Uterine Serosa Involvement: Present Cervical stromal involvement: Not identified Extent of involvement of other organs: - Fallopian tube (left within the lumen) - Ovary, left (surface involvement) - Cul-de-sac Lymphovascular invasion: Present Regional Lymph Nodes: Examined: 1 Sentinel 12 Non-sentinel 13 Total Lymph nodes with metastasis: 0 Isolated tumor cells (< 0.2 mm): 0 Micrometastasis: (> 0.2 mm and < 2.0 mm): 0 Macrometastasis: (> 2.0 mm): 0 Extracapsular extension: N/A Tumor block for ancillary studies: 5E, 5B MMR / MSI testing: Pending will be reported separately Pathologic Stage Classification (pTNM, AJCC 8th edition): pT3a, pN0 FIGO Stage: IIIA COMMENT: There is tumor present on the surface of the left ovary and within the lumen of the left fallopian tube. The carcinoma appears to be mixed with the largest component being high grade serous carcinoma. Dr. Lyndon Code reviewed the case and agrees with the above diagnosis.   01/23/2018 Surgery   Surgeon: Donaciano Eva     Operation: Robotic-assisted laparoscopic total hysterectomy with bilateral salpingoophorectomy, SLN injection, mapping and biopsy, right pelvic and para-aortic lymphadenectomy   Operative Findings:  : 10-12cm bulky uterus with frank serosal involvement on posterior cul de sac peritoneum and anterior peritoneum which adhesed the bladder to the anterior uterus. Unilateral mapping on left pelvis. No grossly suspicious nodes. Normal omentum and diaphragms.    02/01/2018 Pathology Results   Lymph node, needle/core biopsy, right axilla - METASTATIC CARCINOMA - SEE COMMENT Microscopic Comment The neoplastic cells are positive for cytokeratin 7 and Pax-8 but negative for cytokeratin 5/6, cytokeratin 20, Gata-3, p63, p53 and  GCDFP. Overall, the immunoprofile is consistent with metastasis from the patient's known gynecologic carcinoma.   02/01/2018 Procedure   Ultrasound-guided core biopsies of a suspicious right axillary lymph node.   02/21/2018 - 06/13/2018 Chemotherapy   The patient had carboplatin & Taxol x 6   03/26/2018 - 04/30/2018 Radiation Therapy   Radiation treatment dates:   03/26/18, 04/02/18, 04/19/18, 04/23/18 04/30/18   Site/dose: proximal Vagina, 6 Gy in 5 fractions for a total dose of 30 Gy     04/26/2018 PET scan   1. Interval resection of the hypermetabolic endometrial primary. No discernible hypermetabolic pelvic sidewall or peritoneal metastases. 2. Stable hypermetabolic bilateral axillary lymphadenopathy. The degree of hypermetabolism in these lymph nodes remains highly suspicious for neoplasm. 3. Interval development of low level FDG uptake in 2 stable, small lymph nodes in the left groin region. This may be reactive, but close attention recommended to exclude metastatic involvement. 4. Stable non hypermetabolic low-density liver lesions and the mixed lucent and sclerotic T12 lesion is stable without hypermetabolism today.   07/09/2018 Imaging   Status post hysterectomy.   Mild axillary lymphadenopathy, improved. Nodal metastases not excluded.   Irregular wall thickening involving the anterior bladder, possibly reflecting radiation cystitis versus tumor.   Additional stable findings as above, including a 5 mm right middle lobe nodule and stable sclerosis involving the T12 vertebral body.   10/10/2018 Imaging   1. Stable exam. No findings within the abdomen or pelvis to suggest metastatic disease. 2. Unchanged appearance of sclerosis involving the T12 vertebra. 3.  Aortic Atherosclerosis (ICD10-I70.0).   10/10/2018 Imaging   Ct abdomen and  pelvis 1. Stable exam. No findings within the abdomen or pelvis to suggest metastatic disease. 2. Unchanged appearance of sclerosis involving the T12  vertebra. 3.  Aortic Atherosclerosis (ICD10-I70.0).   01/16/2019 PET scan   1. Enlargement and increased activity in the left axillary, right axillary, and left inguinal adenopathy compatible with progressive malignancy. 2. Stable 4 mm right middle lobe subpleural nodule, not appreciably hypermetabolic. 3. Other imaging findings of potential clinical significance: Right kidney lower pole cyst. Aortic Atherosclerosis (ICD10-I70.0). Prominent stool throughout the colon favors constipation. Mild chronic left maxillary sinusitis.     02/01/2019 -  Chemotherapy   The patient had pembrolizumab and Lenvima for chemotherapy treatment.     04/25/2019 Imaging   Ct imaging 1. Interval response to therapy. Decrease in size of bilateral axillary and left inguinal lymph nodes. No new or progressive findings identified. 2. Stable sclerotic appearance of the T12 vertebra. 3. Aortic atherosclerosis. Lad coronary artery calcification noted.   08/30/2019 Imaging   1. Bulky RIGHT hilar lymph node, slightly increased in size from previous imaging. 2. Multiple foci of hyperenhancement in the liver. 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 Consider MRI liver on follow-up to establish a baseline for number of lesions as these will appear variable on CT follow-up based on phase of contrast acquisition in the future. 3. Stable 5 mm nodule adjacent to the fissure, minor fissure in the RIGHT middle lobe.     11/28/2019 Imaging   1. Stable exam. No new or progressive findings. 2. Stable enlarged right axillary lymph node. 3. Stable tiny bilateral pulmonary nodules. Continued attention on follow-up recommended. 4. Scattered hypodensities in the liver parenchyma correspond to the hypervascular lesions seen on the previous  study performed with intravenous contrast material. 5. Right renal cyst. 6. Aortic Atherosclerosis (ICD10-I70.0).     03/16/2020 Imaging   Increased echogenicity of renal parenchyma is noted bilaterally suggesting medical renal disease. No hydronephrosis or renal obstruction is noted   04/02/2020 Imaging   1. Right axillary lymphadenopathy is stable. Tiny bilateral pulmonary nodules are stable. Subcentimeter low-attenuation liver lesions are stable. 2. No new or progressive metastatic disease in the chest, abdomen or pelvis. 3. Aortic Atherosclerosis (ICD10-I70.0).   09/15/2020 Imaging   1. Stable RIGHT axillary lymph node with enlargement measuring 2 cm short axis. 2. Stable small nodule along the minor fissure in the RIGHT chest. 3. No new or progressive findings. 4. Aortic atherosclerosis.     PHYSICAL EXAMINATION: ECOG PERFORMANCE STATUS: 0 - Asymptomatic  Vitals:   01/22/21 0808  BP: (!) 157/96  Pulse: 90  Resp: 18  Temp: (!) 96.8 F (36 C)  SpO2: 99%   Filed Weights   01/22/21 0808  Weight: 159 lb 3.2 oz (72.2 kg)    GENERAL:alert, no distress and comfortable NEURO: alert & oriented x 3 with fluent speech, no focal motor/sensory deficits  LABORATORY DATA:  I have reviewed the data as listed    Component Value Date/Time   NA 144 01/22/2021 0755   K 3.2 (L) 01/22/2021 0755   CL 113 (H) 01/22/2021 0755   CO2 21 (L) 01/22/2021 0755   GLUCOSE 111 (H) 01/22/2021 0755   BUN 18 01/22/2021 0755   CREATININE 1.27 (H) 01/22/2021 0755   CREATININE 1.75 (H) 01/10/2020 1200  CALCIUM 8.9 01/22/2021 0755   PROT 6.4 (L) 01/22/2021 0755   ALBUMIN 3.5 01/22/2021 0755   AST 14 (L) 01/22/2021 0755   AST 13 (L) 01/10/2020 1200   ALT 13 01/22/2021 0755   ALT 11 01/10/2020 1200   ALKPHOS 69 01/22/2021 0755   BILITOT 0.6 01/22/2021 0755   BILITOT 0.4 01/10/2020 1200   GFRNONAA 45 (L) 01/22/2021 0755   GFRNONAA 29 (L) 01/10/2020 1200   GFRAA 34 (L) 01/10/2020 1200     No results found for: SPEP, UPEP  Lab Results  Component Value Date   WBC 4.5 01/22/2021   NEUTROABS 2.8 01/22/2021   HGB 13.1 01/22/2021   HCT 40.5 01/22/2021   MCV 86.4 01/22/2021   PLT 197 01/22/2021      Chemistry      Component Value Date/Time   NA 144 01/22/2021 0755   K 3.2 (L) 01/22/2021 0755   CL 113 (H) 01/22/2021 0755   CO2 21 (L) 01/22/2021 0755   BUN 18 01/22/2021 0755   CREATININE 1.27 (H) 01/22/2021 0755   CREATININE 1.75 (H) 01/10/2020 1200      Component Value Date/Time   CALCIUM 8.9 01/22/2021 0755   ALKPHOS 69 01/22/2021 0755   AST 14 (L) 01/22/2021 0755   AST 13 (L) 01/10/2020 1200   ALT 13 01/22/2021 0755   ALT 11 01/10/2020 1200   BILITOT 0.6 01/22/2021 0755   BILITOT 0.4 01/10/2020 1200

## 2021-01-22 NOTE — Assessment & Plan Note (Signed)
Recent renal function fluctuates up and down We discussed the importance of aggressive risk factor modification and to drink plenty of oral fluids 

## 2021-01-22 NOTE — Assessment & Plan Note (Signed)
Overall, her exam is benign Her CT imaging show persistent right axillary lymphadenopathy but overall stable in size; this is not palpable on clinical exam She tolerated treatment very well without major side effects except hypertension We will continue treatment as scheduled I plan to space out her imaging study to every 6 months, her next imaging will be in early December

## 2021-01-22 NOTE — Assessment & Plan Note (Signed)
TSH is intermittently elevated, could be related to side effects of checkpoint inhibitors The dose of her Synthroid was recently modified We will continue to monitor that carefully 

## 2021-01-25 DIAGNOSIS — E1122 Type 2 diabetes mellitus with diabetic chronic kidney disease: Secondary | ICD-10-CM | POA: Diagnosis not present

## 2021-01-25 DIAGNOSIS — M1711 Unilateral primary osteoarthritis, right knee: Secondary | ICD-10-CM | POA: Diagnosis not present

## 2021-01-25 DIAGNOSIS — C801 Malignant (primary) neoplasm, unspecified: Secondary | ICD-10-CM | POA: Diagnosis not present

## 2021-01-25 DIAGNOSIS — E213 Hyperparathyroidism, unspecified: Secondary | ICD-10-CM | POA: Diagnosis not present

## 2021-01-25 DIAGNOSIS — C779 Secondary and unspecified malignant neoplasm of lymph node, unspecified: Secondary | ICD-10-CM | POA: Diagnosis not present

## 2021-01-25 DIAGNOSIS — I700 Atherosclerosis of aorta: Secondary | ICD-10-CM | POA: Diagnosis not present

## 2021-01-25 DIAGNOSIS — C541 Malignant neoplasm of endometrium: Secondary | ICD-10-CM | POA: Diagnosis not present

## 2021-01-25 DIAGNOSIS — I10 Essential (primary) hypertension: Secondary | ICD-10-CM | POA: Diagnosis not present

## 2021-01-25 DIAGNOSIS — C799 Secondary malignant neoplasm of unspecified site: Secondary | ICD-10-CM | POA: Diagnosis not present

## 2021-01-25 DIAGNOSIS — E039 Hypothyroidism, unspecified: Secondary | ICD-10-CM | POA: Diagnosis not present

## 2021-01-25 DIAGNOSIS — E663 Overweight: Secondary | ICD-10-CM | POA: Diagnosis not present

## 2021-01-25 DIAGNOSIS — E1165 Type 2 diabetes mellitus with hyperglycemia: Secondary | ICD-10-CM | POA: Diagnosis not present

## 2021-01-27 ENCOUNTER — Ambulatory Visit: Payer: Medicare Other | Admitting: Endocrinology

## 2021-02-11 ENCOUNTER — Ambulatory Visit: Payer: Medicare Other | Admitting: Hematology and Oncology

## 2021-02-11 ENCOUNTER — Ambulatory Visit: Payer: Medicare Other

## 2021-02-11 ENCOUNTER — Other Ambulatory Visit: Payer: Medicare Other

## 2021-02-12 ENCOUNTER — Other Ambulatory Visit: Payer: Self-pay

## 2021-02-12 ENCOUNTER — Encounter: Payer: Self-pay | Admitting: Hematology and Oncology

## 2021-02-12 ENCOUNTER — Inpatient Hospital Stay: Payer: Medicare Other

## 2021-02-12 ENCOUNTER — Inpatient Hospital Stay (HOSPITAL_BASED_OUTPATIENT_CLINIC_OR_DEPARTMENT_OTHER): Payer: Medicare Other | Admitting: Hematology and Oncology

## 2021-02-12 VITALS — BP 152/94

## 2021-02-12 DIAGNOSIS — Z7189 Other specified counseling: Secondary | ICD-10-CM

## 2021-02-12 DIAGNOSIS — N183 Chronic kidney disease, stage 3 unspecified: Secondary | ICD-10-CM | POA: Diagnosis not present

## 2021-02-12 DIAGNOSIS — I10 Essential (primary) hypertension: Secondary | ICD-10-CM

## 2021-02-12 DIAGNOSIS — C773 Secondary and unspecified malignant neoplasm of axilla and upper limb lymph nodes: Secondary | ICD-10-CM | POA: Diagnosis not present

## 2021-02-12 DIAGNOSIS — E039 Hypothyroidism, unspecified: Secondary | ICD-10-CM

## 2021-02-12 DIAGNOSIS — Z79899 Other long term (current) drug therapy: Secondary | ICD-10-CM | POA: Diagnosis not present

## 2021-02-12 DIAGNOSIS — C541 Malignant neoplasm of endometrium: Secondary | ICD-10-CM

## 2021-02-12 DIAGNOSIS — Z5112 Encounter for antineoplastic immunotherapy: Secondary | ICD-10-CM | POA: Diagnosis not present

## 2021-02-12 LAB — CBC WITH DIFFERENTIAL/PLATELET
Abs Immature Granulocytes: 0.02 10*3/uL (ref 0.00–0.07)
Basophils Absolute: 0 10*3/uL (ref 0.0–0.1)
Basophils Relative: 0 %
Eosinophils Absolute: 0 10*3/uL (ref 0.0–0.5)
Eosinophils Relative: 1 %
HCT: 38.7 % (ref 36.0–46.0)
Hemoglobin: 12.5 g/dL (ref 12.0–15.0)
Immature Granulocytes: 0 %
Lymphocytes Relative: 30 %
Lymphs Abs: 1.4 10*3/uL (ref 0.7–4.0)
MCH: 28.4 pg (ref 26.0–34.0)
MCHC: 32.3 g/dL (ref 30.0–36.0)
MCV: 88 fL (ref 80.0–100.0)
Monocytes Absolute: 0.2 10*3/uL (ref 0.1–1.0)
Monocytes Relative: 5 %
Neutro Abs: 3 10*3/uL (ref 1.7–7.7)
Neutrophils Relative %: 64 %
Platelets: 175 10*3/uL (ref 150–400)
RBC: 4.4 MIL/uL (ref 3.87–5.11)
RDW: 14.9 % (ref 11.5–15.5)
WBC: 4.7 10*3/uL (ref 4.0–10.5)
nRBC: 0 % (ref 0.0–0.2)

## 2021-02-12 LAB — COMPREHENSIVE METABOLIC PANEL
ALT: 15 U/L (ref 0–44)
AST: 16 U/L (ref 15–41)
Albumin: 3.3 g/dL — ABNORMAL LOW (ref 3.5–5.0)
Alkaline Phosphatase: 72 U/L (ref 38–126)
Anion gap: 7 (ref 5–15)
BUN: 19 mg/dL (ref 8–23)
CO2: 23 mmol/L (ref 22–32)
Calcium: 8.6 mg/dL — ABNORMAL LOW (ref 8.9–10.3)
Chloride: 113 mmol/L — ABNORMAL HIGH (ref 98–111)
Creatinine, Ser: 1.18 mg/dL — ABNORMAL HIGH (ref 0.44–1.00)
GFR, Estimated: 50 mL/min — ABNORMAL LOW (ref >60)
Glucose, Bld: 96 mg/dL (ref 70–99)
Potassium: 3.5 mmol/L (ref 3.5–5.1)
Sodium: 143 mmol/L (ref 135–145)
Total Bilirubin: 0.5 mg/dL (ref 0.3–1.2)
Total Protein: 6 g/dL — ABNORMAL LOW (ref 6.5–8.1)

## 2021-02-12 LAB — TSH: TSH: 1.618 u[IU]/mL (ref 0.308–3.960)

## 2021-02-12 MED ORDER — SODIUM CHLORIDE 0.9 % IV SOLN
200.0000 mg | Freq: Once | INTRAVENOUS | Status: AC
  Administered 2021-02-12: 200 mg via INTRAVENOUS
  Filled 2021-02-12: qty 8

## 2021-02-12 MED ORDER — SODIUM CHLORIDE 0.9% FLUSH
10.0000 mL | INTRAVENOUS | Status: DC | PRN
  Administered 2021-02-12: 10 mL

## 2021-02-12 MED ORDER — SODIUM CHLORIDE 0.9% FLUSH
10.0000 mL | Freq: Once | INTRAVENOUS | Status: AC
  Administered 2021-02-12: 10 mL

## 2021-02-12 MED ORDER — SODIUM CHLORIDE 0.9 % IV SOLN: Freq: Once | INTRAVENOUS | Status: AC

## 2021-02-12 MED ORDER — HEPARIN SOD (PORK) LOCK FLUSH 100 UNIT/ML IV SOLN
500.0000 [IU] | Freq: Once | INTRAVENOUS | Status: AC | PRN
  Administered 2021-02-12: 500 [IU]

## 2021-02-12 NOTE — Patient Instructions (Signed)
[facility]ICAL [facility]COLOG[facility]  ° Discharge Instructions: °Thank you for choosing [facility] to provide your oncology and hematology care.  ° °If you have a lab appointment with the Cancer Center, please go directly to the Cancer Center and check in at the registration area. °  °Wear comfortable clothing and clothing appropriate for easy access to any Portacath or PICC line.  ° °We strive to give you quality time with your provider. You may need to reschedule your appointment if you arrive late (15 or more minutes).  Arriving late affects you and other patients whose appointments are after yours.  Also, if you miss three or more appointments without notifying the office, you may be dismissed from the clinic at the provider’s discretion.    °  °For prescription refill requests, have your pharmacy contact our office and allow 72 hours for refills to be completed.   ° °Today you received the following chemotherapy and/or immunotherapy agents: Pembrolizumab (Keytruda) °  °To help prevent nausea and vomiting after your treatment, we encourage you to take your nausea medication as directed. ° °BELOW ARE SYMPTOMS THAT SHOULD BE REPORTED IMMEDIATELY: °*FEVER GREATER THAN 100.4 F (38 °C) OR HIGHER °*CHILLS OR SWEATING °*NAUSEA AND VOMITING THAT IS NOT CONTROLLED WITH YOUR NAUSEA MEDICATION °*UNUSUAL SHORTNESS OF BREATH °*UNUSUAL BRUISING OR BLEEDING °*URINARY PROBLEMS (pain or burning when urinating, or frequent urination) °*BOWEL PROBLEMS (unusual diarrhea, constipation, pain near the anus) °TENDERNESS IN MOUTH AND THROAT WITH OR WITHOUT PRESENCE OF ULCERS (sore throat, sores in mouth, or a toothache) °UNUSUAL RASH, SWELLING OR PAIN  °UNUSUAL VAGINAL DISCHARGE OR ITCHING  ° °Items with * indicate a potential emergency and should be followed up as soon as possible or go to the Emergency Department if any problems should occur. ° °Please show the CHEMOTHERAPY ALERT CARD or IMMUNOTHERAPY ALERT CARD  at check-in to the Emergency Department and triage nurse. ° °Should you have questions after your visit or need to cancel or reschedule your appointment, please contact [facility]  Dept: [phone]  and follow the prompts.  Office hours are 8:00 a.m. to 4:30 p.m. Monday - Friday. Please note that voicemails left after 4:00 p.m. may not be returned until the following business day.  We are closed weekends and major holidays. You have access to a nurse at all times for urgent questions. Please call the main number to the clinic Dept: [phone] and follow the prompts. ° ° °For any non-urgent questions, you may also contact your provider using MyChart. We now offer e-Visits for anyone 18 and older to request care online for non-urgent symptoms. For details visit [email or web address]. °  °Also download the MyChart app! Go to the app store, search "MyChart", open the app, select [facility], and log in with your MyChart username and password. ° °Due to Covid, a mask is required upon entering the hospital/clinic. If you do not have a mask, one will be given to you upon arrival. For doctor visits, patients may have 1 support person aged 18 or older with them. For treatment visits, patients cannot have anyone with them due to current Covid guidelines and our immunocompromised population.  ° °

## 2021-02-12 NOTE — Assessment & Plan Note (Signed)
Overall, her exam is benign Her CT imaging show persistent right axillary lymphadenopathy but overall stable in size; this is not palpable on clinical exam She tolerated treatment very well without major side effects except hypertension We will continue treatment as scheduled I plan to space out her imaging study to every 6 months, her next imaging will be in early December

## 2021-02-12 NOTE — Assessment & Plan Note (Signed)
She has no palpable enlarged lymph node on exam Observe only

## 2021-02-12 NOTE — Assessment & Plan Note (Signed)
She has fluctuation of BP Her blood pressure at home is usually within normal range Observe closely I will defer to her primary care for further management

## 2021-02-12 NOTE — Progress Notes (Signed)
[facility] OFF[facility] PROGRESS NOTE  Patient Care Team: Jonathon Jordan, MD as PCP - General (Family Medicine) Awanda Mink Craige Cotta, RN as Registered Nurse (Oncology)  ASSESSMENT & PLAN:  Endometrial cancer [facility]) Overall, her exam is benign Her CT imaging show persistent right axillary lymphadenopathy but overall stable in size; this is not palpable on clinical exam She tolerated treatment very well without major side effects except hypertension We will continue treatment as scheduled I plan to space out her imaging study to every 6 months, her next imaging will be in early December  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   All questions were answered. The patient knows to call the clinic with any problems, questions or concerns. The total time spent in the appointment was 20 minutes encounter with patients including review of chart and various tests results, discussions about plan of care and coordination of care plan   [name], MD 02/12/2021 2:45 PM  INTERVAL HISTORY: Please see below for problem oriented charting. she returns for treatment follow-up on Lenvima and pembrolizumab for recurrent uterine cancer She is doing well According to the patient, documented blood pressure from home is usually within normal range She has no new lymphadenopathy or side effects from treatment  REVIEW OF SYSTEMS:   Constitutional: Denies fevers, chills or abnormal weight loss Eyes:  Denies blurriness of vision Ears, nose, mouth, throat, and face: Denies mucositis or sore throat Respiratory: Denies cough, dyspnea or wheezes Cardiovascular: Denies palpitation, chest discomfort or lower extremity swelling Gastrointestinal:  Denies nausea, heartburn or change in bowel habits Skin: Denies abnormal skin rashes Lymphatics: Denies new lymphadenopathy or easy bruising Neurological:Denies numbness, tingling or new weaknesses Behavioral/Psych: Mood is stable, no new changes  All other systems were reviewed with the patient and are negative.  I have reviewed the past medical history, past surgical history, social history and family history with the patient and they are unchanged from previous note.  ALLERGIES:  is allergic to sulfa antibiotics and sulfamethoxazole.  MEDICATIONS:  Current Outpatient Medications  Medication Sig Dispense Refill   amLODipine-valsartan (EXFORGE) 5-160 MG tablet Take 1 tablet by mouth daily.     Calcium Carb-Cholecalciferol 600-800 MG-UNIT TABS Take 1 tablet by mouth daily.     ergocalciferol (VITAMIN D2) 1.25 MG (50000 UT) capsule Take 1 capsule (50,000 Units total) by mouth once a week. 12 capsule 3   lenvatinib 10 mg daily dose (LENVIMA, 10 MG DAILY DOSE,) capsule Take 1 capsule (10 mg total) by mouth daily. 30 capsule 11   levothyroxine (SYNTHROID) 150 MCG tablet Take 1 tablet (150 mcg total) by mouth daily before breakfast. 30 tablet 1   lidocaine-prilocaine (EMLA) cream Apply topically daily as needed. 30 g 3   OneTouch Delica Lancets 92E MISC 1 each by Other route 2 (two) times daily. Use to monitor glucose levels BID; E11.65 100 each 2   ONETOUCH VERIO test strip 1 each by Other route 3 (three) times daily. And lancets 3/day 300 each 3  RELION PEN NEEDLES 32G X 4 MM MISC USE 1 PEN PER DAY 50 each 5   simvastatin (ZOCOR) 10 MG tablet Take 10 mg by mouth at bedtime.      No current facility-administered medications for this visit.    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 Leo Grosser who obtained an endometrial biopsy showing poorly differentiated endometrial carcinoma and negative endocervical curettage   12/12/2017 Imaging   MAMMOGRAM FINDINGS: In the right axilla, a possible mass warrants further evaluation. In the left breast, no findings suspicious for malignancy.   Images were processed with CAD.   IMPRESSION: Further evaluation is suggested for possible mass in the right axilla.     12/24/2017 Imaging   Ct scan chest, abdomen and pelvis 1. Marked thickening of the endometrium (42 mm) compatible with known primary endometrial malignancy. No evidence of extrauterine invasion. 2. No pelvic or retroperitoneal adenopathy. 3. Right middle lobe 4 mm solid pulmonary nodule, for which follow-up chest CT is advised in 3-6 months. 4. Several findings that are equivocal for distant metastatic disease. Vaguely nodular heterogeneous hyperenhancement in the peripheral right liver lobe, which could represent benign transient perfusional phenomena, with underlying liver lesions not entirely excluded. Mildly sclerotic T12 vertebral lesion. Mildly enlarged right axillary lymph node. The best single test to further evaluate these findings would be a PET-CT. Alternative tests include bone scan or thoracic MRI without and with IV contrast for the T12  osseous lesion, MRI abdomen without and with IV contrast for the liver findings, and diagnostic mammographic evaluation for the right axillary node. 5.  Aortic Atherosclerosis (ICD10-I70.0).     01/09/2018 PET scan   1. Moderate hypermetabolism corresponding to enlarging axillary node since 12/22/2017. Highly suspicious for an atypical distribution of metastatic disease. 2. No hypermetabolism to suggest hepatic or T12 osseous metastasis. 3. Hypermetabolic endometrial primary.   01/23/2018 Initial Diagnosis   Endometrial cancer ([place])   01/23/2018 Pathology Results   1. Lymph node, sentinel, biopsy, left external iliac - NO CARCINOMA IDENTIFIED IN ONE LYMPH NODE (0/1) - SEE COMMENT 2. Lymph nodes, regional resection, right para aortic - NO CARCINOMA IDENTIFIED IN FOUR LYMPH NODES (0/4) - SEE COMMENT 3. Lymph nodes, regional resection, right pelvic - NO CARCINOMA IDENTIFIED IN EIGHT LYMPH NODES (0/8) - SEE COMMENT 4. Cul-de-sac biopsy - METASTATIC CARCINOMA 5. Uterus +/- tubes/ovaries, neoplastic, cervix, bilateral fallopian tubes and ovaries UTERUS: - MIXED SEROUS AND ENDOMETRIOID CARCINOMA - SEROSAL IMPLANTS PRESENT - LYMPHOVASCULAR SPACE INVASION PRESENT - LEIOMYOMATA (1.5 CM; LARGEST) - SEE ONCOLOGY TABLE AND COMMENT BELOW CERVIX: - BENIGN NABOTHIAN CYSTS - NO CARCINOMA IDENTIFIED BILATERAL OVARIES: - METASTATIC CARCINOMA PRESENT ON OVARIAN SURFACE BILATERAL FALLOPIAN TUBES: - INTRALUMINAL CARCINOMA Microscopic Comment 1. -3. Cytokeratin AE1/3 was performed on the sentinel lymph nodes to exclude micrometastasis. There is no evidence of metastatic carcinoma by immunohistochemistry. 5. 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%) Histologic Grade:  N/A Myometrial invasion: Estimated less than 50% myometrial invasion (0.3 cm of myometrium involved; 1.4 cm measured thickness) Uterine Serosa Involvement: Present Cervical stromal involvement: Not identified Extent of involvement of other organs: - Fallopian tube (left within the lumen) - Ovary, left (surface involvement) - Cul-de-sac Lymphovascular invasion: Present Regional Lymph Nodes: Examined: 1 Sentinel 12 Non-sentinel 13 Total Lymph nodes with metastasis: 0 Isolated tumor cells (< 0.2 mm): 0 Micrometastasis: (> 0.2 mm and < 2.0 mm): 0 Macrometastasis: (> 2.0 mm): 0 Extracapsular extension: N/A Tumor block for ancillary studies: 5E, 5B MMR / MSI testing: Pending will be reported separately Pathologic Stage Classification (pTNM, AJCC 8th edition): pT3a, pN0 FIGO Stage: IIIA COMMENT: There is tumor present on the surface of the left ovary and within the lumen of the left fallopian tube. The carcinoma appears to be mixed with the largest component being high grade serous carcinoma. Dr. Lyndon Code reviewed the case and agrees with the above diagnosis.   01/23/2018 Surgery   Surgeon: Donaciano Eva     Operation: Robotic-assisted laparoscopic total hysterectomy with bilateral salpingoophorectomy, SLN injection, mapping and biopsy, right pelvic and para-aortic lymphadenectomy   Operative Findings:  : 10-12cm bulky uterus with frank serosal involvement on posterior cul de sac peritoneum and anterior peritoneum which adhesed the bladder to the anterior uterus. Unilateral mapping on left pelvis. No grossly suspicious nodes. Normal omentum and diaphragms.    02/01/2018 Pathology Results   Lymph node, needle/core biopsy, right axilla - METASTATIC CARCINOMA - SEE COMMENT Microscopic Comment The neoplastic cells are positive for cytokeratin 7 and Pax-8 but negative for cytokeratin 5/6, cytokeratin 20, Gata-3, p63, p53 and GCDFP. Overall, the immunoprofile is consistent with metastasis  from the patient's known gynecologic carcinoma.   02/01/2018 Procedure   Ultrasound-guided core biopsies of a suspicious right axillary lymph node.   02/21/2018 - 06/13/2018 Chemotherapy   The patient had carboplatin & Taxol x 6   03/26/2018 - 04/30/2018 Radiation Therapy   Radiation treatment dates:   03/26/18, 04/02/18, 04/19/18, 04/23/18 04/30/18   Site/dose: proximal Vagina, 6 Gy in 5 fractions for a total dose of 30 Gy     04/26/2018 PET scan   1. Interval resection of the hypermetabolic endometrial primary. No discernible hypermetabolic pelvic sidewall or peritoneal metastases. 2. Stable hypermetabolic bilateral axillary lymphadenopathy. The degree of hypermetabolism in these lymph nodes remains highly suspicious for neoplasm. 3. Interval development of low level FDG uptake in 2 stable, small lymph nodes in the left groin region. This may be reactive, but close attention recommended to exclude metastatic involvement. 4. Stable non hypermetabolic low-density liver lesions and the mixed lucent and sclerotic T12 lesion is stable without hypermetabolism today.   07/09/2018 Imaging   Status post hysterectomy.   Mild axillary lymphadenopathy, improved. Nodal metastases not excluded.   Irregular wall thickening involving the anterior bladder, possibly reflecting radiation cystitis versus tumor.   Additional stable findings as above, including a 5 mm right middle lobe nodule and stable sclerosis involving the T12 vertebral body.   10/10/2018 Imaging   1. Stable exam. No findings within the abdomen or pelvis to suggest metastatic disease. 2. Unchanged appearance of sclerosis involving the T12 vertebra. 3.  Aortic Atherosclerosis (ICD10-I70.0).   10/10/2018 Imaging   Ct abdomen and pelvis 1. Stable exam. No findings within the abdomen  or pelvis to suggest metastatic disease. 2. Unchanged appearance of sclerosis involving the T12 vertebra. 3.  Aortic Atherosclerosis (ICD10-I70.0).   01/16/2019  PET scan   1. Enlargement and increased activity in the left axillary, right axillary, and left inguinal adenopathy compatible with progressive malignancy. 2. Stable 4 mm right middle lobe subpleural nodule, not appreciably hypermetabolic. 3. Other imaging findings of potential clinical significance: Right kidney lower pole cyst. Aortic Atherosclerosis (ICD10-I70.0). Prominent stool throughout the colon favors constipation. Mild chronic left maxillary sinusitis.     02/01/2019 -  Chemotherapy   The patient had pembrolizumab and Lenvima for chemotherapy treatment.     04/25/2019 Imaging   Ct imaging 1. Interval response to therapy. Decrease in size of bilateral axillary and left inguinal lymph nodes. No new or progressive findings identified. 2. Stable sclerotic appearance of the T12 vertebra. 3. Aortic atherosclerosis. Lad coronary artery calcification noted.   08/30/2019 Imaging   1. Bulky RIGHT hilar lymph node, slightly increased in size from previous imaging. 2. Multiple foci of hyperenhancement in the liver. 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 Consider MRI liver on follow-up to establish a baseline for number of lesions as these will appear variable on CT follow-up based on phase of contrast acquisition in the future. 3. Stable 5 mm nodule adjacent to the fissure, minor fissure in the RIGHT middle lobe.     11/28/2019 Imaging   1. Stable exam. No new or progressive findings. 2. Stable enlarged right axillary lymph node. 3. Stable tiny bilateral pulmonary nodules. Continued attention on follow-up recommended. 4. Scattered hypodensities in the liver parenchyma correspond to the hypervascular lesions seen on the previous study performed with intravenous contrast material. 5. Right renal  cyst. 6. Aortic Atherosclerosis (ICD10-I70.0).     03/16/2020 Imaging   Increased echogenicity of renal parenchyma is noted bilaterally suggesting medical renal disease. No hydronephrosis or renal obstruction is noted   04/02/2020 Imaging   1. Right axillary lymphadenopathy is stable. Tiny bilateral pulmonary nodules are stable. Subcentimeter low-attenuation liver lesions are stable. 2. No new or progressive metastatic disease in the chest, abdomen or pelvis. 3. Aortic Atherosclerosis (ICD10-I70.0).   09/15/2020 Imaging   1. Stable RIGHT axillary lymph node with enlargement measuring 2 cm short axis. 2. Stable small nodule along the minor fissure in the RIGHT chest. 3. No new or progressive findings. 4. Aortic atherosclerosis.     PHYSICAL EXAMINATION: ECOG PERFORMANCE STATUS: 0 - Asymptomatic  Vitals:   02/12/21 1156  BP: (!) 185/101  Pulse: 85  Resp: 18  Temp: 98 F (36.7 C)  SpO2: 100%   Filed Weights   02/12/21 1156  Weight: 161 lb 3.2 oz (73.1 kg)    GENERAL:alert, no distress and comfortable SKIN: skin color, texture, turgor are normal, no rashes or significant lesions EYES: normal, Conjunctiva are pink and non-injected, sclera clear OROPHARYNX:no exudate, no erythema and lips, buccal mucosa, and tongue normal  NECK: supple, thyroid normal size, non-tender, without nodularity LYMPH:  no palpable lymphadenopathy in the cervical, axillary or inguinal LUNGS: clear to auscultation and percussion with normal breathing effort HEART: regular rate & rhythm and no murmurs and no lower extremity edema ABDOMEN:abdomen soft, non-tender and normal bowel sounds Musculoskeletal:no cyanosis of digits and no clubbing  NEURO: alert & oriented x 3  with fluent speech, no focal motor/sensory deficits  LABORATORY DATA:  I have reviewed the data as listed    Component Value Date/Time   NA 143 02/12/2021 1124   K 3.5 02/12/2021 1124   CL 113 (H) 02/12/2021 1124   CO2 23  02/12/2021 1124   GLUCOSE 96 02/12/2021 1124   BUN 19 02/12/2021 1124   CREATININE 1.18 (H) 02/12/2021 1124   CREATININE 1.75 (H) 01/10/2020 1200   CALCIUM 8.6 (L) 02/12/2021 1124   PROT 6.0 (L) 02/12/2021 1124   ALBUMIN 3.3 (L) 02/12/2021 1124   AST 16 02/12/2021 1124   AST 13 (L) 01/10/2020 1200   ALT 15 02/12/2021 1124   ALT 11 01/10/2020 1200   ALKPHOS 72 02/12/2021 1124   BILITOT 0.5 02/12/2021 1124   BILITOT 0.4 01/10/2020 1200   GFRNONAA 50 (L) 02/12/2021 1124   GFRNONAA 29 (L) 01/10/2020 1200   GFRAA 34 (L) 01/10/2020 1200    No results found for: SPEP, UPEP  Lab Results  Component Value Date   WBC 4.7 02/12/2021   NEUTROABS 3.0 02/12/2021   HGB 12.5 02/12/2021   HCT 38.7 02/12/2021   MCV 88.0 02/12/2021   PLT 175 02/12/2021      Chemistry      Component Value Date/Time   NA 143 02/12/2021 1124   K 3.5 02/12/2021 1124   CL 113 (H) 02/12/2021 1124   CO2 23 02/12/2021 1124   BUN 19 02/12/2021 1124   CREATININE 1.18 (H) 02/12/2021 1124   CREATININE 1.75 (H) 01/10/2020 1200      Component Value Date/Time   CALCIUM 8.6 (L) 02/12/2021 1124   ALKPHOS 72 02/12/2021 1124   AST 16 02/12/2021 1124   AST 13 (L) 01/10/2020 1200   ALT 15 02/12/2021 1124   ALT 11 01/10/2020 1200   BILITOT 0.5 02/12/2021 1124   BILITOT 0.4 01/10/2020 1200

## 2021-02-12 NOTE — Assessment & Plan Note (Signed)
Recent renal function fluctuates up and down We discussed the importance of aggressive risk factor modification and to drink plenty of oral fluids 

## 2021-02-16 ENCOUNTER — Telehealth: Payer: Self-pay

## 2021-02-16 NOTE — Telephone Encounter (Signed)
Oral Oncology Patient Advocate Encounter  Met patient in lobby room to complete re-enrollment application for [employer] in an effort to reduce patient's out of pocket expense for Lenvima to $0.    Application completed and faxed to [phone].   [employer] patient assistance phone number for follow up is [phone].   This encounter will be updated until final determination.   Elizabeth Tilley CPHT Specialty Pharmacy Patient Advocate [facility] Phone [phone] Fax [phone] 02/16/2021 11:26 AM   

## 2021-02-26 ENCOUNTER — Ambulatory Visit: Payer: Medicare Other

## 2021-03-01 ENCOUNTER — Telehealth: Payer: Self-pay | Admitting: *Deleted

## 2021-03-01 NOTE — Telephone Encounter (Signed)
Received faxed clarification request for Levothyroxine from [facility], [address]. Per information on faxed request - provider approval needed to approve changing patient's generic levothyroxine to brand manufacture by [employer] as patient previously received levothyroxine manufactured by [employer] as [employer] no longer manufactures levothyroxine.   Contacted pharmacy, spoke with pharmacist Dray. They explained that pharmacy is required to notify provider when changes occur with certain medications,including levothyroxine.  Brand name Synthroid is available with patient's insurance, but cost for patient for 1 month supply is $46.00. Cost for 1 month supply with generic levothyroxine made by  [employer] is $1.00.   Dr. Alvy Bimler informed of request for approval r/t change in manufacturer and alternative treatment/cost with Synthroid. She approved change to levothyroxine manufactured by [employer].   Pharmacist Norwood Levo notified of MD approval.

## 2021-03-05 ENCOUNTER — Inpatient Hospital Stay: Payer: Medicare Other

## 2021-03-05 ENCOUNTER — Inpatient Hospital Stay: Payer: Medicare Other | Attending: Gynecology

## 2021-03-05 ENCOUNTER — Telehealth: Payer: Self-pay

## 2021-03-05 ENCOUNTER — Other Ambulatory Visit: Payer: Self-pay

## 2021-03-05 VITALS — BP 156/93 | HR 84 | Temp 98.0°F | Resp 14 | Wt 159.0 lb

## 2021-03-05 DIAGNOSIS — C773 Secondary and unspecified malignant neoplasm of axilla and upper limb lymph nodes: Secondary | ICD-10-CM

## 2021-03-05 DIAGNOSIS — Z79899 Other long term (current) drug therapy: Secondary | ICD-10-CM | POA: Diagnosis not present

## 2021-03-05 DIAGNOSIS — C779 Secondary and unspecified malignant neoplasm of lymph node, unspecified: Secondary | ICD-10-CM | POA: Insufficient documentation

## 2021-03-05 DIAGNOSIS — C541 Malignant neoplasm of endometrium: Secondary | ICD-10-CM

## 2021-03-05 DIAGNOSIS — Z5112 Encounter for antineoplastic immunotherapy: Secondary | ICD-10-CM | POA: Diagnosis not present

## 2021-03-05 DIAGNOSIS — E039 Hypothyroidism, unspecified: Secondary | ICD-10-CM

## 2021-03-05 DIAGNOSIS — Z7189 Other specified counseling: Secondary | ICD-10-CM

## 2021-03-05 LAB — CBC WITH DIFFERENTIAL/PLATELET
Abs Immature Granulocytes: 0.02 10*3/uL (ref 0.00–0.07)
Basophils Absolute: 0 10*3/uL (ref 0.0–0.1)
Basophils Relative: 0 %
Eosinophils Absolute: 0 10*3/uL (ref 0.0–0.5)
Eosinophils Relative: 0 %
HCT: 40.6 % (ref 36.0–46.0)
Hemoglobin: 12.9 g/dL (ref 12.0–15.0)
Immature Granulocytes: 0 %
Lymphocytes Relative: 29 %
Lymphs Abs: 1.3 10*3/uL (ref 0.7–4.0)
MCH: 27.8 pg (ref 26.0–34.0)
MCHC: 31.8 g/dL (ref 30.0–36.0)
MCV: 87.5 fL (ref 80.0–100.0)
Monocytes Absolute: 0.2 10*3/uL (ref 0.1–1.0)
Monocytes Relative: 5 %
Neutro Abs: 2.9 10*3/uL (ref 1.7–7.7)
Neutrophils Relative %: 66 %
Platelets: 182 10*3/uL (ref 150–400)
RBC: 4.64 MIL/uL (ref 3.87–5.11)
RDW: 14.6 % (ref 11.5–15.5)
WBC: 4.5 10*3/uL (ref 4.0–10.5)
nRBC: 0 % (ref 0.0–0.2)

## 2021-03-05 LAB — COMPREHENSIVE METABOLIC PANEL
ALT: 13 U/L (ref 0–44)
AST: 14 U/L — ABNORMAL LOW (ref 15–41)
Albumin: 3.3 g/dL — ABNORMAL LOW (ref 3.5–5.0)
Alkaline Phosphatase: 74 U/L (ref 38–126)
Anion gap: 10 (ref 5–15)
BUN: 20 mg/dL (ref 8–23)
CO2: 20 mmol/L — ABNORMAL LOW (ref 22–32)
Calcium: 8.8 mg/dL — ABNORMAL LOW (ref 8.9–10.3)
Chloride: 113 mmol/L — ABNORMAL HIGH (ref 98–111)
Creatinine, Ser: 1.15 mg/dL — ABNORMAL HIGH (ref 0.44–1.00)
GFR, Estimated: 51 mL/min — ABNORMAL LOW (ref >60)
Glucose, Bld: 132 mg/dL — ABNORMAL HIGH (ref 70–99)
Potassium: 3.3 mmol/L — ABNORMAL LOW (ref 3.5–5.1)
Sodium: 143 mmol/L (ref 135–145)
Total Bilirubin: 0.4 mg/dL (ref 0.3–1.2)
Total Protein: 6.1 g/dL — ABNORMAL LOW (ref 6.5–8.1)

## 2021-03-05 LAB — TSH: TSH: 1.612 u[IU]/mL (ref 0.308–3.960)

## 2021-03-05 MED ORDER — SODIUM CHLORIDE 0.9% FLUSH
10.0000 mL | Freq: Once | INTRAVENOUS | Status: AC
  Administered 2021-03-05: 10 mL

## 2021-03-05 MED ORDER — SODIUM CHLORIDE 0.9 % IV SOLN
Freq: Once | INTRAVENOUS | Status: AC
Start: 2021-03-05 — End: 2021-03-05

## 2021-03-05 MED ORDER — HEPARIN SOD (PORK) LOCK FLUSH 100 UNIT/ML IV SOLN
500.0000 [IU] | Freq: Once | INTRAVENOUS | Status: AC | PRN
  Administered 2021-03-05: 500 [IU]

## 2021-03-05 MED ORDER — SODIUM CHLORIDE 0.9 % IV SOLN
200.0000 mg | Freq: Once | INTRAVENOUS | Status: AC
  Administered 2021-03-05: 200 mg via INTRAVENOUS
  Filled 2021-03-05: qty 8

## 2021-03-05 MED ORDER — SODIUM CHLORIDE 0.9% FLUSH
10.0000 mL | INTRAVENOUS | Status: DC | PRN
  Administered 2021-03-05: 10 mL

## 2021-03-05 NOTE — Telephone Encounter (Signed)
Pt called and asked if she needs to go for imaging. Per MD last note, pt is to have imaging Q6MO. No orders of what type have been placed. Message forwarded to MD to determine what scans she would like. Pt knows we will be in contact with her 11/21 regarding this.

## 2021-03-05 NOTE — Patient Instructions (Signed)
[facility]  Discharge Instructions: ?Thank you for choosing [facility] to provide your oncology and hematology care.  ? ?If you have a lab appointment with the Cancer Center, please go directly to the Cancer Center and check in at the registration area. ?  ?Wear comfortable clothing and clothing appropriate for easy access to any Portacath or PICC line.  ? ?We strive to give you quality time with your provider. You may need to reschedule your appointment if you arrive late (15 or more minutes).  Arriving late affects you and other patients whose appointments are after yours.  Also, if you miss three or more appointments without notifying the office, you may be dismissed from the clinic at the provider?s discretion.    ?  ?For prescription refill requests, have your pharmacy contact our office and allow 72 hours for refills to be completed.   ? ?Today you received the following chemotherapy and/or immunotherapy agents Keytruda    ?  ?To help prevent nausea and vomiting after your treatment, we encourage you to take your nausea medication as directed. ? ?BELOW ARE SYMPTOMS THAT SHOULD BE REPORTED IMMEDIATELY: ?*FEVER GREATER THAN 100.4 F (38 ?C) OR HIGHER ?*CHILLS OR SWEATING ?*NAUSEA AND VOMITING THAT IS NOT CONTROLLED WITH YOUR NAUSEA MEDICATION ?*UNUSUAL SHORTNESS OF BREATH ?*UNUSUAL BRUISING OR BLEEDING ?*URINARY PROBLEMS (pain or burning when urinating, or frequent urination) ?*BOWEL PROBLEMS (unusual diarrhea, constipation, pain near the anus) ?TENDERNESS IN MOUTH AND THROAT WITH OR WITHOUT PRESENCE OF ULCERS (sore throat, sores in mouth, or a toothache) ?UNUSUAL RASH, SWELLING OR PAIN  ?UNUSUAL VAGINAL DISCHARGE OR ITCHING  ? ?Items with * indicate a potential emergency and should be followed up as soon as possible or go to the Emergency Department if any problems should occur. ? ?Please show the CHEMOTHERAPY ALERT CARD or IMMUNOTHERAPY ALERT CARD at check-in to  the Emergency Department and triage nurse. ? ?Should you have questions after your visit or need to cancel or reschedule your appointment, please contact [facility]  Dept: [phone]  and follow the prompts.  Office hours are 8:00 a.m. to 4:30 p.m. Monday - Friday. Please note that voicemails left after 4:00 p.m. may not be returned until the following business day.  We are closed weekends and major holidays. You have access to a nurse at all times for urgent questions. Please call the main number to the clinic Dept: [phone] and follow the prompts. ? ? ?For any non-urgent questions, you may also contact your provider using MyChart. We now offer e-Visits for anyone 18 and older to request care online for non-urgent symptoms. For details visit [email or web address]. ?  ?Also download the MyChart app! Go to the app store, search "MyChart", open the app, select [place], and log in with your MyChart username and password. ? ?Due to Covid, a mask is required upon entering the hospital/clinic. If you do not have a mask, one will be given to you upon arrival. For doctor visits, patients may have 1 support person aged 18 or older with them. For treatment visits, patients cannot have anyone with them due to current Covid guidelines and our immunocompromised population.  ? ?Hypokalemia ?Hypokalemia means that the amount of potassium in the blood is lower than normal. Potassium is a chemical (electrolyte) that helps regulate the amount of fluid in the body. It also stimulates muscle tightening (contraction) and helps nerves work properly. ?Normally, most of the body's potassium is inside   cells, and only a very small amount is in the blood. Because the amount in the blood is so small, minor changes to potassium levels in the blood can be life-threatening. ?What are the causes? ?This condition may be caused by: ?Antibiotic medicine. ?Diarrhea or vomiting. Taking too much of a medicine  that helps you have a bowel movement (laxative) can cause diarrhea and lead to hypokalemia. ?Chronic kidney disease (CKD). ?Medicines that help the body get rid of excess fluid (diuretics). ?Eating disorders, such as bulimia. ?Low magnesium levels in the body. ?Sweating a lot. ?What are the signs or symptoms? ?Symptoms of this condition include: ?Weakness. ?Constipation. ?Fatigue. ?Muscle cramps. ?Mental confusion. ?Skipped heartbeats or irregular heartbeat (palpitations). ?Tingling or numbness. ?How is this diagnosed? ?This condition is diagnosed with a blood test. ?How is this treated? ?This condition may be treated by: ?Taking potassium supplements by mouth. ?Adjusting the medicines that you take. ?Eating more foods that contain a lot of potassium. ?If your potassium level is very low, you may need to get potassium through an IV and be monitored in the hospital. ?Follow these instructions at home: ? ?Take over-the-counter and prescription medicines only as told by your health care provider. This includes vitamins and supplements. ?Eat a healthy diet. A healthy diet includes fresh fruits and vegetables, whole grains, healthy fats, and lean proteins. ?If instructed, eat more foods that contain a lot of potassium. This includes: ?Nuts, such as peanuts and pistachios. ?Seeds, such as sunflower seeds and pumpkin seeds. ?Peas, lentils, and lima beans. ?Whole grain and bran cereals and breads. ?Fresh fruits and vegetables, such as apricots, avocado, bananas, cantaloupe, kiwi, oranges, tomatoes, asparagus, and potatoes. ?Orange juice. ?Tomato juice. ?Red meats. ?Yogurt. ?Keep all follow-up visits as told by your health care provider. This is important. ?Contact a health care provider if you: ?Have weakness that gets worse. ?Feel your heart pounding or racing. ?Vomit. ?Have diarrhea. ?Have diabetes (diabetes mellitus) and you have trouble keeping your blood sugar (glucose) in your target range. ?Get help right away if  you: ?Have chest pain. ?Have shortness of breath. ?Have vomiting or diarrhea that lasts for more than 2 days. ?Faint. ?Summary ?Hypokalemia means that the amount of potassium in the blood is lower than normal. ?This condition is diagnosed with a blood test. ?Hypokalemia may be treated by taking potassium supplements, adjusting the medicines that you take, or eating more foods that are high in potassium. ?If your potassium level is very low, you may need to get potassium through an IV and be monitored in the hospital. ?This information is not intended to replace advice given to you by your health care provider. Make sure you discuss any questions you have with your health care provider. ?Document Revised: 11/14/2017 Document Reviewed: 11/15/2017 ?Elsevier Patient Education ? 2022 [employer] ? ? ?

## 2021-03-08 NOTE — Telephone Encounter (Signed)
There is no right or wrong answer I can schedule either before her next appointment or after Christmas She needs scans to be done 2 days before her appt Let me know her preference and I will place order

## 2021-03-12 ENCOUNTER — Telehealth: Payer: Self-pay

## 2021-03-12 ENCOUNTER — Other Ambulatory Visit: Payer: Self-pay | Admitting: Hematology and Oncology

## 2021-03-12 DIAGNOSIS — C773 Secondary and unspecified malignant neoplasm of axilla and upper limb lymph nodes: Secondary | ICD-10-CM

## 2021-03-12 DIAGNOSIS — C541 Malignant neoplasm of endometrium: Secondary | ICD-10-CM

## 2021-03-12 NOTE — Telephone Encounter (Signed)
Patient provided with detailed instructions on scheduling her upcoming CT scan prior to her next MD visit with Dr. Alvy Bimler. Patient knows that she will need to schedule CT on 12/6 or 12/7. Patient verbalized an understanding and confirmed correct phone number to schedule.  All questions answered. Patient knows to call back if she requires assistance.

## 2021-03-19 ENCOUNTER — Encounter: Payer: Self-pay | Admitting: Hematology and Oncology

## 2021-03-23 ENCOUNTER — Ambulatory Visit (HOSPITAL_COMMUNITY)
Admission: RE | Admit: 2021-03-23 | Discharge: 2021-03-23 | Disposition: A | Discharge status: Home / Self Care | Payer: Medicare Other | Source: Ambulatory Visit | Attending: Hematology and Oncology | Admitting: Hematology and Oncology

## 2021-03-23 ENCOUNTER — Other Ambulatory Visit: Payer: Self-pay

## 2021-03-23 ENCOUNTER — Encounter (HOSPITAL_COMMUNITY): Payer: Self-pay

## 2021-03-23 DIAGNOSIS — C541 Malignant neoplasm of endometrium: Secondary | ICD-10-CM | POA: Diagnosis not present

## 2021-03-23 DIAGNOSIS — I700 Atherosclerosis of aorta: Secondary | ICD-10-CM | POA: Diagnosis not present

## 2021-03-23 DIAGNOSIS — C773 Secondary and unspecified malignant neoplasm of axilla and upper limb lymph nodes: Secondary | ICD-10-CM | POA: Insufficient documentation

## 2021-03-23 MED ORDER — HEPARIN SOD (PORK) LOCK FLUSH 100 UNIT/ML IV SOLN
INTRAVENOUS | Status: AC
  Administered 2021-03-23: 500 [IU] via INTRAVENOUS
  Filled 2021-03-23: qty 5

## 2021-03-23 MED ORDER — HEPARIN SOD (PORK) LOCK FLUSH 100 UNIT/ML IV SOLN: 500.0000 [IU] | Freq: Once | INTRAVENOUS | Status: AC

## 2021-03-23 MED ORDER — IOHEXOL 350 MG/ML SOLN
60.0000 mL | Freq: Once | INTRAVENOUS | Status: AC | PRN
  Administered 2021-03-23: 60 mL via INTRAVENOUS

## 2021-03-24 ENCOUNTER — Other Ambulatory Visit: Payer: Self-pay | Admitting: Hematology and Oncology

## 2021-03-26 ENCOUNTER — Inpatient Hospital Stay: Payer: Medicare Other

## 2021-03-26 ENCOUNTER — Encounter: Payer: Self-pay | Admitting: Hematology and Oncology

## 2021-03-26 ENCOUNTER — Other Ambulatory Visit: Payer: Self-pay

## 2021-03-26 ENCOUNTER — Inpatient Hospital Stay (HOSPITAL_BASED_OUTPATIENT_CLINIC_OR_DEPARTMENT_OTHER): Payer: Medicare Other | Admitting: Hematology and Oncology

## 2021-03-26 ENCOUNTER — Inpatient Hospital Stay: Payer: Medicare Other | Attending: Gynecology

## 2021-03-26 ENCOUNTER — Telehealth: Payer: Self-pay | Admitting: Hematology and Oncology

## 2021-03-26 VITALS — BP 160/89 | HR 79

## 2021-03-26 VITALS — BP 164/104 | HR 84 | Temp 96.4°F | Resp 19 | Ht 66.0 in | Wt 172.4 lb

## 2021-03-26 DIAGNOSIS — Z5112 Encounter for antineoplastic immunotherapy: Secondary | ICD-10-CM | POA: Insufficient documentation

## 2021-03-26 DIAGNOSIS — Z79899 Other long term (current) drug therapy: Secondary | ICD-10-CM | POA: Insufficient documentation

## 2021-03-26 DIAGNOSIS — I700 Atherosclerosis of aorta: Secondary | ICD-10-CM | POA: Insufficient documentation

## 2021-03-26 DIAGNOSIS — C541 Malignant neoplasm of endometrium: Secondary | ICD-10-CM | POA: Diagnosis not present

## 2021-03-26 DIAGNOSIS — E039 Hypothyroidism, unspecified: Secondary | ICD-10-CM | POA: Diagnosis not present

## 2021-03-26 DIAGNOSIS — N183 Chronic kidney disease, stage 3 unspecified: Secondary | ICD-10-CM

## 2021-03-26 DIAGNOSIS — Z7189 Other specified counseling: Secondary | ICD-10-CM

## 2021-03-26 DIAGNOSIS — R590 Localized enlarged lymph nodes: Secondary | ICD-10-CM | POA: Diagnosis not present

## 2021-03-26 DIAGNOSIS — C773 Secondary and unspecified malignant neoplasm of axilla and upper limb lymph nodes: Secondary | ICD-10-CM

## 2021-03-26 LAB — CBC WITH DIFFERENTIAL/PLATELET
Abs Immature Granulocytes: 0.01 10*3/uL (ref 0.00–0.07)
Basophils Absolute: 0 10*3/uL (ref 0.0–0.1)
Basophils Relative: 0 %
Eosinophils Absolute: 0 10*3/uL (ref 0.0–0.5)
Eosinophils Relative: 1 %
HCT: 42.5 % (ref 36.0–46.0)
Hemoglobin: 13.4 g/dL (ref 12.0–15.0)
Immature Granulocytes: 0 %
Lymphocytes Relative: 29 %
Lymphs Abs: 1.3 10*3/uL (ref 0.7–4.0)
MCH: 27.6 pg (ref 26.0–34.0)
MCHC: 31.5 g/dL (ref 30.0–36.0)
MCV: 87.6 fL (ref 80.0–100.0)
Monocytes Absolute: 0.2 10*3/uL (ref 0.1–1.0)
Monocytes Relative: 5 %
Neutro Abs: 2.8 10*3/uL (ref 1.7–7.7)
Neutrophils Relative %: 65 %
Platelets: 198 10*3/uL (ref 150–400)
RBC: 4.85 MIL/uL (ref 3.87–5.11)
RDW: 14.6 % (ref 11.5–15.5)
WBC: 4.3 10*3/uL (ref 4.0–10.5)
nRBC: 0 % (ref 0.0–0.2)

## 2021-03-26 LAB — COMPREHENSIVE METABOLIC PANEL
ALT: 15 U/L (ref 0–44)
AST: 14 U/L — ABNORMAL LOW (ref 15–41)
Albumin: 3.4 g/dL — ABNORMAL LOW (ref 3.5–5.0)
Alkaline Phosphatase: 72 U/L (ref 38–126)
Anion gap: 11 (ref 5–15)
BUN: 22 mg/dL (ref 8–23)
CO2: 18 mmol/L — ABNORMAL LOW (ref 22–32)
Calcium: 8.5 mg/dL — ABNORMAL LOW (ref 8.9–10.3)
Chloride: 115 mmol/L — ABNORMAL HIGH (ref 98–111)
Creatinine, Ser: 1.33 mg/dL — ABNORMAL HIGH (ref 0.44–1.00)
GFR, Estimated: 43 mL/min — ABNORMAL LOW (ref >60)
Glucose, Bld: 133 mg/dL — ABNORMAL HIGH (ref 70–99)
Potassium: 3.4 mmol/L — ABNORMAL LOW (ref 3.5–5.1)
Sodium: 144 mmol/L (ref 135–145)
Total Bilirubin: 0.6 mg/dL (ref 0.3–1.2)
Total Protein: 6.2 g/dL — ABNORMAL LOW (ref 6.5–8.1)

## 2021-03-26 LAB — TSH: TSH: 7.271 u[IU]/mL — ABNORMAL HIGH (ref 0.308–3.960)

## 2021-03-26 MED ORDER — SODIUM CHLORIDE 0.9 % IV SOLN
200.0000 mg | Freq: Once | INTRAVENOUS | Status: AC
  Administered 2021-03-26: 200 mg via INTRAVENOUS
  Filled 2021-03-26: qty 8

## 2021-03-26 MED ORDER — LIDOCAINE-PRILOCAINE 2.5-2.5 % EX CREA: TOPICAL_CREAM | Freq: Every day | CUTANEOUS | 3 refills | Status: DC | PRN

## 2021-03-26 MED ORDER — SODIUM CHLORIDE 0.9 % IV SOLN: Freq: Once | INTRAVENOUS | Status: AC

## 2021-03-26 MED ORDER — HEPARIN SOD (PORK) LOCK FLUSH 100 UNIT/ML IV SOLN
500.0000 [IU] | Freq: Once | INTRAVENOUS | Status: AC | PRN
  Administered 2021-03-26: 500 [IU]

## 2021-03-26 MED ORDER — SODIUM CHLORIDE 0.9% FLUSH
10.0000 mL | Freq: Once | INTRAVENOUS | Status: AC
  Administered 2021-03-26: 10 mL

## 2021-03-26 MED ORDER — SODIUM CHLORIDE 0.9% FLUSH
10.0000 mL | INTRAVENOUS | Status: DC | PRN
  Administered 2021-03-26: 10 mL

## 2021-03-26 MED ORDER — LEVOTHYROXINE SODIUM 175 MCG PO TABS: 175.0000 ug | ORAL_TABLET | Freq: Every day | ORAL | 1 refills | Status: DC

## 2021-03-26 NOTE — Assessment & Plan Note (Signed)
Recent renal function fluctuates up and down We discussed the importance of aggressive risk factor modification and to drink plenty of oral fluids 

## 2021-03-26 NOTE — Telephone Encounter (Signed)
Scheduled per 12/9 los, message has been left with pt

## 2021-03-26 NOTE — Assessment & Plan Note (Signed)
She has signs of coronary artery disease on CT imaging I recommend cardiology consult The patient wants to go home and think about it

## 2021-03-26 NOTE — Assessment & Plan Note (Signed)
Her TSH is high I will increase the dose of her Synthroid We will monitor her thyroid function carefully

## 2021-03-26 NOTE — Assessment & Plan Note (Signed)
I have reviewed multiple CT imaging with the patient She has persistent right axillary lymphadenopathy, overall stable She tolerated treatment well except for acquired hypothyroidism and intermittent uncontrolled hypertension I plan to space out imaging study to 4 to 6 months in the future

## 2021-03-26 NOTE — Patient Instructions (Signed)
[facility]  ° Discharge Instructions: °Thank you for choosing [facility] to provide your oncology and hematology care.  ° °If you have a lab appointment with the Cancer Center, please go directly to the Cancer Center and check in at the registration area. °  °Wear comfortable clothing and clothing appropriate for easy access to any Portacath or PICC line.  ° °We strive to give you quality time with your provider. You may need to reschedule your appointment if you arrive late (15 or more minutes).  Arriving late affects you and other patients whose appointments are after yours.  Also, if you miss three or more appointments without notifying the office, you may be dismissed from the clinic at the provider’s discretion.    °  °For prescription refill requests, have your pharmacy contact our office and allow 72 hours for refills to be completed.   ° °Today you received the following chemotherapy and/or immunotherapy agents: Pembrolizumab (Keytruda) °  °To help prevent nausea and vomiting after your treatment, we encourage you to take your nausea medication as directed. ° °BELOW ARE SYMPTOMS THAT SHOULD BE REPORTED IMMEDIATELY: °*FEVER GREATER THAN 100.4 F (38 °C) OR HIGHER °*CHILLS OR SWEATING °*NAUSEA AND VOMITING THAT IS NOT CONTROLLED WITH YOUR NAUSEA MEDICATION °*UNUSUAL SHORTNESS OF BREATH °*UNUSUAL BRUISING OR BLEEDING °*URINARY PROBLEMS (pain or burning when urinating, or frequent urination) °*BOWEL PROBLEMS (unusual diarrhea, constipation, pain near the anus) °TENDERNESS IN MOUTH AND THROAT WITH OR WITHOUT PRESENCE OF ULCERS (sore throat, sores in mouth, or a toothache) °UNUSUAL RASH, SWELLING OR PAIN  °UNUSUAL VAGINAL DISCHARGE OR ITCHING  ° °Items with * indicate a potential emergency and should be followed up as soon as possible or go to the Emergency Department if any problems should occur. ° °Please show the CHEMOTHERAPY ALERT CARD or IMMUNOTHERAPY ALERT CARD  at check-in to the Emergency Department and triage nurse. ° °Should you have questions after your visit or need to cancel or reschedule your appointment, please contact [facility]  Dept: [phone]  and follow the prompts.  Office hours are 8:00 a.m. to 4:30 p.m. Monday - Friday. Please note that voicemails left after 4:00 p.m. may not be returned until the following business day.  We are closed weekends and major holidays. You have access to a nurse at all times for urgent questions. Please call the main number to the clinic Dept: [phone] and follow the prompts. ° ° °For any non-urgent questions, you may also contact your provider using MyChart. We now offer e-Visits for anyone 18 and older to request care online for non-urgent symptoms. For details visit [email or web address]. °  °Also download the MyChart app! Go to the app store, search "MyChart", open the app, select [place], and log in with your MyChart username and password. ° °Due to Covid, a mask is required upon entering the hospital/clinic. If you do not have a mask, one will be given to you upon arrival. For doctor visits, patients may have 1 support person aged 18 or older with them. For treatment visits, patients cannot have anyone with them due to current Covid guidelines and our immunocompromised population.  ° °

## 2021-03-26 NOTE — Progress Notes (Signed)
[facility] OFFICE PROGRESS NOTE  Patient Care Team: Jonathon Jordan, MD as PCP - General (Family Medicine) Awanda Mink Craige Cotta, RN as Registered Nurse (Oncology)  ASSESSMENT & PLAN:  Endometrial cancer [facility]) I have reviewed multiple CT imaging with the patient She has persistent right axillary lymphadenopathy, overall stable She tolerated treatment well except for acquired hypothyroidism and intermittent uncontrolled hypertension I plan to space out imaging study to 4 to 6 months in the future  Acquired hypothyroidism Her TSH is high I will increase the dose of her Synthroid We will monitor her thyroid function carefully  Chronic kidney disease (CKD), stage III (moderate) (HCC) Recent renal function fluctuates up and down We discussed the importance of aggressive risk factor modification and to drink plenty of oral fluids  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[place]) She has signs of coronary artery disease on CT imaging I recommend cardiology consult The patient wants to go home and think about it  Orders Placed This Encounter  Procedures   TSH    Standing Status:   Standing    Number of Occurrences:   22    Standing Expiration Date:   03/26/2022    All questions were answered. The patient knows to call the clinic with any problems, questions or concerns. The total time spent in the appointment was 30 minutes encounter with patients including review of chart and various tests results, discussions about plan of care and coordination of care plan   [name], MD 03/26/2021 10:42 AM  INTERVAL HISTORY: Please see below for problem oriented charting. she returns for treatment follow-up on pembrolizumab and Lenvima for recurrent uterine cancer She is doing well According to the patient, her documented blood pressure at home were satisfactory She denies recent infection no new palpable lymphadenopathy No new side effects of treatment  REVIEW OF SYSTEMS:    Constitutional: Denies fevers, chills or abnormal weight loss Eyes: Denies blurriness of vision Ears, nose, mouth, throat, and face: Denies mucositis or sore throat Respiratory: Denies cough, dyspnea or wheezes Cardiovascular: Denies palpitation, chest discomfort or lower extremity swelling Gastrointestinal:  Denies nausea, heartburn or change in bowel habits Skin: Denies abnormal skin rashes Lymphatics: Denies new lymphadenopathy or easy bruising Neurological:Denies numbness, tingling or new weaknesses Behavioral/Psych: Mood is stable, no new changes  All other systems were reviewed with the patient and are negative.  I have reviewed the past medical history, past surgical history, social history and family history with the patient and they are unchanged from previous note.  ALLERGIES:  is allergic to sulfa antibiotics and sulfamethoxazole.  MEDICATIONS:  Current Outpatient Medications  Medication Sig Dispense Refill   amLODipine-valsartan (EXFORGE) 5-160 MG tablet Take 1 tablet by mouth daily.     Calcium Carb-Cholecalciferol 600-800 MG-UNIT TABS Take 1 tablet by mouth daily.     ergocalciferol (VITAMIN D2) 1.25 MG (50000 UT) capsule Take 1 capsule (50,000 Units total) by mouth once a week. 12 capsule 3   lenvatinib 10 mg daily dose (LENVIMA, 10 MG DAILY DOSE,) capsule Take 1 capsule (10 mg total) by mouth daily. 30 capsule 11   levothyroxine (SYNTHROID) 175 MCG tablet Take 1 tablet (175 mcg total) by mouth daily before breakfast. 30 tablet 1   lidocaine-prilocaine (EMLA) cream Apply topically daily as needed. 30 g 3   OneTouch Delica Lancets 16X MISC 1 each by Other route 2 (two) times daily. Use to monitor glucose levels BID; E11.65 100 each 2   ONETOUCH VERIO test strip 1 each by  Other route 3 (three) times daily. And lancets 3/day 300 each 3   RELION PEN NEEDLES 32G X 4 MM MISC USE 1 PEN PER DAY 50 each 5   simvastatin (ZOCOR) 10 MG tablet Take 10 mg by mouth at bedtime.      No  current facility-administered medications for this visit.   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 Leo Grosser who obtained an endometrial biopsy showing poorly differentiated endometrial carcinoma and negative endocervical curettage   12/12/2017 Imaging   MAMMOGRAM FINDINGS: In the right axilla, a possible mass warrants further evaluation. In the left breast, no findings suspicious for malignancy.   Images were processed with CAD.   IMPRESSION: Further evaluation is suggested for possible mass in the right axilla.     12/24/2017 Imaging   Ct scan chest, abdomen and pelvis 1. Marked thickening of the endometrium (42 mm) compatible with known primary endometrial malignancy. No evidence of extrauterine invasion. 2. No pelvic or retroperitoneal adenopathy. 3. Right middle lobe 4 mm solid pulmonary nodule, for which follow-up chest CT is advised in 3-6 months. 4. Several findings that are equivocal for distant metastatic disease. Vaguely nodular heterogeneous hyperenhancement in the peripheral right liver lobe, which could represent benign transient perfusional phenomena, with underlying liver lesions not entirely excluded. Mildly sclerotic T12 vertebral lesion. Mildly enlarged right axillary lymph node. The best single test to further evaluate these findings would be a PET-CT. Alternative tests include bone scan or thoracic MRI without and with IV contrast for the T12 osseous  lesion, MRI abdomen without and with IV contrast for the liver findings, and diagnostic mammographic evaluation for the right axillary node. 5.  Aortic Atherosclerosis (ICD10-I70.0).     01/09/2018 PET scan   1. Moderate hypermetabolism corresponding to enlarging axillary node since 12/22/2017. Highly suspicious for an atypical distribution of metastatic disease. 2. No hypermetabolism to suggest hepatic or T12 osseous metastasis. 3. Hypermetabolic endometrial primary.   01/23/2018 Initial Diagnosis   Endometrial cancer ([place])   01/23/2018 Pathology Results   1. Lymph node, sentinel, biopsy, left external iliac - NO CARCINOMA IDENTIFIED IN ONE LYMPH NODE (0/1) - SEE COMMENT 2. Lymph nodes, regional resection, right para aortic - NO CARCINOMA IDENTIFIED IN FOUR LYMPH NODES (0/4) - SEE COMMENT 3. Lymph nodes, regional resection, right pelvic - NO CARCINOMA IDENTIFIED IN EIGHT LYMPH NODES (0/8) - SEE COMMENT 4. Cul-de-sac biopsy - METASTATIC CARCINOMA 5. Uterus +/- tubes/ovaries, neoplastic, cervix, bilateral fallopian tubes and ovaries UTERUS: - MIXED SEROUS AND ENDOMETRIOID CARCINOMA - SEROSAL IMPLANTS PRESENT - LYMPHOVASCULAR SPACE INVASION PRESENT - LEIOMYOMATA (1.5 CM; LARGEST) - SEE ONCOLOGY TABLE AND COMMENT BELOW CERVIX: - BENIGN NABOTHIAN CYSTS - NO CARCINOMA IDENTIFIED BILATERAL OVARIES: - METASTATIC CARCINOMA PRESENT ON OVARIAN SURFACE BILATERAL FALLOPIAN TUBES: - INTRALUMINAL CARCINOMA Microscopic Comment 1. -3. Cytokeratin AE1/3 was performed on the sentinel lymph nodes to exclude micrometastasis. There is no evidence of metastatic carcinoma by immunohistochemistry. 5. 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%) Histologic Grade:  N/A Myometrial invasion: Estimated less than 50% myometrial invasion (0.3 cm of myometrium involved; 1.4 cm measured thickness) Uterine Serosa Involvement: Present Cervical stromal involvement: Not identified Extent of involvement of other organs: - Fallopian tube (left within the lumen) - Ovary, left (surface involvement) - Cul-de-sac Lymphovascular invasion: Present Regional Lymph Nodes: Examined: 1 Sentinel 12 Non-sentinel 13 Total Lymph nodes with metastasis: 0 Isolated tumor cells (< 0.2 mm): 0 Micrometastasis: (> 0.2 mm and < 2.0 mm): 0 Macrometastasis: (> 2.0 mm): 0 Extracapsular extension: N/A Tumor block for ancillary studies: 5E, 5B MMR / MSI testing: Pending will be reported separately Pathologic Stage Classification (pTNM, AJCC 8th edition): pT3a, pN0 FIGO Stage: IIIA COMMENT: There is tumor present on the surface of the left ovary and within the lumen of the left fallopian tube. The carcinoma appears to be mixed with the largest component being high grade serous carcinoma. Dr. Lyndon Code reviewed the case and agrees with the above diagnosis.   01/23/2018 Surgery   Surgeon: Donaciano Eva     Operation: Robotic-assisted laparoscopic total hysterectomy with bilateral salpingoophorectomy, SLN injection, mapping and biopsy, right pelvic and para-aortic lymphadenectomy   Operative Findings:  : 10-12cm bulky uterus with frank serosal involvement on posterior cul de sac peritoneum and anterior peritoneum which adhesed the bladder to the anterior uterus. Unilateral mapping on left pelvis. No grossly suspicious nodes. Normal omentum and diaphragms.    02/01/2018 Pathology Results   Lymph node, needle/core biopsy, right axilla - METASTATIC CARCINOMA - SEE COMMENT Microscopic Comment The neoplastic cells are positive for cytokeratin 7 and Pax-8 but negative for cytokeratin 5/6, cytokeratin 20, Gata-3, p63, p53 and GCDFP. Overall, the immunoprofile is consistent with metastasis  from the patient's known gynecologic carcinoma.   02/01/2018 Procedure   Ultrasound-guided core biopsies of a suspicious right axillary lymph node.   02/21/2018 - 06/13/2018 Chemotherapy   The patient had carboplatin & Taxol x 6   03/26/2018 - 04/30/2018 Radiation Therapy   Radiation treatment dates:   03/26/18, 04/02/18, 04/19/18, 04/23/18 04/30/18   Site/dose: proximal Vagina, 6 Gy in 5 fractions for a total dose of 30 Gy     04/26/2018 PET scan   1. Interval resection of the hypermetabolic endometrial primary. No discernible hypermetabolic pelvic sidewall or peritoneal metastases. 2. Stable hypermetabolic bilateral axillary lymphadenopathy. The degree of hypermetabolism in these lymph nodes remains highly suspicious for neoplasm. 3. Interval development of low level FDG uptake in 2 stable, small lymph nodes in the left groin region. This may be reactive, but close attention recommended to exclude metastatic involvement. 4. Stable non hypermetabolic low-density liver lesions and the mixed lucent and sclerotic T12 lesion is stable without hypermetabolism today.   07/09/2018 Imaging   Status post hysterectomy.   Mild axillary lymphadenopathy, improved. Nodal metastases not excluded.   Irregular wall thickening involving the anterior bladder, possibly reflecting radiation cystitis versus tumor.   Additional stable findings as above, including a 5 mm right middle lobe nodule and stable sclerosis involving the T12 vertebral body.   10/10/2018 Imaging   1. Stable exam. No findings within the abdomen or pelvis to suggest metastatic disease. 2. Unchanged appearance of sclerosis involving the T12 vertebra. 3.  Aortic Atherosclerosis (ICD10-I70.0).   10/10/2018 Imaging   Ct abdomen and pelvis 1. Stable exam. No findings within the abdomen or pelvis to  suggest metastatic disease. 2. Unchanged appearance of sclerosis involving the T12 vertebra. 3.  Aortic Atherosclerosis (ICD10-I70.0).   01/16/2019  PET scan   1. Enlargement and increased activity in the left axillary, right axillary, and left inguinal adenopathy compatible with progressive malignancy. 2. Stable 4 mm right middle lobe subpleural nodule, not appreciably hypermetabolic. 3. Other imaging findings of potential clinical significance: Right kidney lower pole cyst. Aortic Atherosclerosis (ICD10-I70.0). Prominent stool throughout the colon favors constipation. Mild chronic left maxillary sinusitis.     02/01/2019 -  Chemotherapy   The patient had pembrolizumab and Lenvima for chemotherapy treatment.     04/25/2019 Imaging   Ct imaging 1. Interval response to therapy. Decrease in size of bilateral axillary and left inguinal lymph nodes. No new or progressive findings identified. 2. Stable sclerotic appearance of the T12 vertebra. 3. Aortic atherosclerosis. Lad coronary artery calcification noted.   08/30/2019 Imaging   1. Bulky RIGHT hilar lymph node, slightly increased in size from previous imaging. 2. Multiple foci of hyperenhancement in the liver. 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 Consider MRI liver on follow-up to establish a baseline for number of lesions as these will appear variable on CT follow-up based on phase of contrast acquisition in the future. 3. Stable 5 mm nodule adjacent to the fissure, minor fissure in the RIGHT middle lobe.     11/28/2019 Imaging   1. Stable exam. No new or progressive findings. 2. Stable enlarged right axillary lymph node. 3. Stable tiny bilateral pulmonary nodules. Continued attention on follow-up recommended. 4. Scattered hypodensities in the liver parenchyma correspond to the hypervascular lesions seen on the previous study performed with intravenous contrast material. 5. Right renal  cyst. 6. Aortic Atherosclerosis (ICD10-I70.0).     03/16/2020 Imaging   Increased echogenicity of renal parenchyma is noted bilaterally suggesting medical renal disease. No hydronephrosis or renal obstruction is noted   04/02/2020 Imaging   1. Right axillary lymphadenopathy is stable. Tiny bilateral pulmonary nodules are stable. Subcentimeter low-attenuation liver lesions are stable. 2. No new or progressive metastatic disease in the chest, abdomen or pelvis. 3. Aortic Atherosclerosis (ICD10-I70.0).   09/15/2020 Imaging   1. Stable RIGHT axillary lymph node with enlargement measuring 2 cm short axis. 2. Stable small nodule along the minor fissure in the RIGHT chest. 3. No new or progressive findings. 4. Aortic atherosclerosis.   03/24/2021 Imaging   1. Right axillary lymphadenopathy is stable compared to prior studies. 2. No other definite signs of metastatic disease elsewhere in the thorax. 3. Hepatic steatosis. Numerous hypervascular areas noted in the liver, incompletely characterized on today's arterial phase examination. These are similar to remote prior examination from 08/30/2019, likely to represent small benign lesions such as flash fill cavernous hemangiomas. These could be definitively evaluated with nonemergent abdominal MRI with and without IV gadolinium if of clinical concern. 4. Aortic atherosclerosis, in addition to left main and left anterior descending coronary artery disease. Assessment for potential risk factor modification, dietary therapy or pharmacologic therapy may be warranted, if clinically indicated.   Aortic Atherosclerosis (ICD10-I70.0).     PHYSICAL EXAMINATION: ECOG PERFORMANCE STATUS: 1 - Symptomatic but completely ambulatory  Vitals:   03/26/21 0807  BP: (!) 164/104  Pulse: 84  Resp: 19  Temp: (!) 96.4 F (35.8 C)  SpO2: 99%   Filed Weights   03/26/21 0807  Weight: 172 lb 6.4 oz (78.2 kg)    GENERAL:alert, no distress and comfortable SKIN:  skin color, texture, turgor are normal, no rashes or significant lesions EYES: normal, Conjunctiva are pink and non-injected, sclera clear OROPHARYNX:no exudate, no erythema and lips, buccal mucosa, and tongue normal  NECK: supple, thyroid normal size, non-tender, without nodularity LYMPH:  no palpable lymphadenopathy in the cervical, axillary or inguinal LUNGS: clear to auscultation and percussion with normal breathing effort HEART: regular rate & rhythm and no murmurs and no lower extremity edema ABDOMEN:abdomen soft, non-tender and normal bowel sounds Musculoskeletal:no cyanosis of digits and no clubbing  NEURO: alert & oriented x 3 with fluent speech, no focal motor/sensory deficits  LABORATORY DATA:  I have reviewed the data as listed    Component Value Date/Time   NA 144 03/26/2021 0742   K 3.4 (L) 03/26/2021 0742   CL 115 (H) 03/26/2021 0742   CO2 18 (L) 03/26/2021 0742   GLUCOSE 133 (H) 03/26/2021 0742   BUN 22 03/26/2021 0742   CREATININE 1.33 (H) 03/26/2021 0742   CREATININE 1.75 (H) 01/10/2020 1200   CALCIUM 8.5 (L) 03/26/2021 0742   PROT 6.2 (L) 03/26/2021 0742   ALBUMIN 3.4 (L) 03/26/2021 0742   AST 14 (L) 03/26/2021 0742   AST 13 (L) 01/10/2020 1200   ALT 15 03/26/2021 0742   ALT 11 01/10/2020 1200   ALKPHOS 72 03/26/2021 0742   BILITOT 0.6 03/26/2021 0742   BILITOT 0.4 01/10/2020 1200   GFRNONAA 43 (L) 03/26/2021 0742   GFRNONAA 29 (L) 01/10/2020 1200   GFRAA 34 (L) 01/10/2020 1200    No results found for: SPEP, UPEP  Lab Results  Component Value Date   WBC 4.3 03/26/2021   NEUTROABS 2.8 03/26/2021   HGB 13.4 03/26/2021   HCT 42.5 03/26/2021   MCV 87.6 03/26/2021   PLT 198 03/26/2021      Chemistry      Component Value Date/Time   NA 144 03/26/2021 0742   K 3.4 (L) 03/26/2021 0742   CL 115 (H) 03/26/2021 0742   CO2 18 (L) 03/26/2021 0742   BUN 22 03/26/2021 0742   CREATININE 1.33 (H) 03/26/2021 0742   CREATININE 1.75 (H) 01/10/2020 1200       Component Value Date/Time   CALCIUM 8.5 (L) 03/26/2021 0742   ALKPHOS 72 03/26/2021 0742   AST 14 (L) 03/26/2021 0742   AST 13 (L) 01/10/2020 1200   ALT 15 03/26/2021 0742   ALT 11 01/10/2020 1200   BILITOT 0.6 03/26/2021 0742   BILITOT 0.4 01/10/2020 1200       RADIOGRAPHIC STUDIES: I have reviewed multiple imaging studies with the patient I have personally reviewed the radiological images as listed and agreed with the findings in the report. CT CHEST W CONTRAST  Result Date: 03/24/2021 CLINICAL DATA:  70 year old female with history of endometrial cancer diagnosed in 2019 status post chemotherapy and radiation therapy now complete. Evaluate for treatment response. EXAM: CT CHEST WITH CONTRAST TECHNIQUE: Multidetector CT imaging of the chest was performed during intravenous contrast administration. CONTRAST:  41m OMNIPAQUE IOHEXOL 350 MG/ML SOLN COMPARISON:  CT of the chest 09/15/2020. FINDINGS: Cardiovascular: Heart size is normal. There is no significant pericardial fluid, thickening or pericardial calcification. There is aortic atherosclerosis, as well as atherosclerosis of the great vessels of the mediastinum and the coronary arteries, including calcified atherosclerotic plaque in the left main and left anterior  descending coronary arteries. Right internal jugular single-lumen porta cath with tip terminating in the superior aspect of the right atrium. Mediastinum/Nodes: No pathologically enlarged mediastinal or hilar lymph nodes. Esophagus is unremarkable in appearance. Persistent right axillary lymphadenopathy with a single enlarged lymph node measuring up to 1.7 cm in short axis, similar to the prior study. No left axillary lymphadenopathy. Lungs/Pleura: 3 mm subpleural nodule in the right middle lobe associated with the minor fissure (axial image 62 of series 7), stable, likely a benign subpleural lymph node. No other larger more suspicious appearing pulmonary nodules or masses are noted.  No acute consolidative airspace disease. No pleural effusions. Upper Abdomen: Diffuse low attenuation throughout the visualized hepatic parenchyma, indicative of a background of hepatic steatosis. There are multiple hypervascular lesions scattered throughout the hepatic parenchyma, incompletely characterized on today's arterial phase examination, largest of which is in the periphery of segment 8 (axial image 104 of series 2) measuring 1.6 x 1.0 cm. Musculoskeletal: Hemangioma in T12 vertebral body again noted. There are no aggressive appearing lytic or blastic lesions noted in the visualized portions of the skeleton. IMPRESSION: 1. Right axillary lymphadenopathy is stable compared to prior studies. 2. No other definite signs of metastatic disease elsewhere in the thorax. 3. Hepatic steatosis. Numerous hypervascular areas noted in the liver, incompletely characterized on today's arterial phase examination. These are similar to remote prior examination from 08/30/2019, likely to represent small benign lesions such as flash fill cavernous hemangiomas. These could be definitively evaluated with nonemergent abdominal MRI with and without IV gadolinium if of clinical concern. 4. Aortic atherosclerosis, in addition to left main and left anterior descending coronary artery disease. Assessment for potential risk factor modification, dietary therapy or pharmacologic therapy may be warranted, if clinically indicated. Aortic Atherosclerosis (ICD10-I70.0). Electronically Signed   By: Vinnie Langton M.D.   On: 03/24/2021 08:23

## 2021-03-29 DIAGNOSIS — Z20822 Contact with and (suspected) exposure to covid-19: Secondary | ICD-10-CM | POA: Diagnosis not present

## 2021-04-01 ENCOUNTER — Ambulatory Visit
Admission: RE | Admit: 2021-04-01 | Discharge: 2021-04-01 | Disposition: A | Discharge status: Home / Self Care | Payer: Medicare Other | Source: Ambulatory Visit | Attending: Family Medicine | Admitting: Family Medicine

## 2021-04-01 DIAGNOSIS — Z1231 Encounter for screening mammogram for malignant neoplasm of breast: Secondary | ICD-10-CM | POA: Diagnosis not present

## 2021-04-04 ENCOUNTER — Other Ambulatory Visit: Payer: Self-pay

## 2021-04-22 ENCOUNTER — Inpatient Hospital Stay: Payer: Medicare Other

## 2021-04-22 ENCOUNTER — Inpatient Hospital Stay (HOSPITAL_BASED_OUTPATIENT_CLINIC_OR_DEPARTMENT_OTHER): Payer: Medicare Other | Admitting: Hematology and Oncology

## 2021-04-22 ENCOUNTER — Encounter: Payer: Self-pay | Admitting: Hematology and Oncology

## 2021-04-22 ENCOUNTER — Other Ambulatory Visit: Payer: Self-pay

## 2021-04-22 ENCOUNTER — Inpatient Hospital Stay: Payer: Medicare Other | Attending: Gynecology

## 2021-04-22 VITALS — BP 148/99 | HR 76 | Temp 97.6°F | Resp 18

## 2021-04-22 DIAGNOSIS — N183 Chronic kidney disease, stage 3 unspecified: Secondary | ICD-10-CM

## 2021-04-22 DIAGNOSIS — C541 Malignant neoplasm of endometrium: Secondary | ICD-10-CM

## 2021-04-22 DIAGNOSIS — Z79899 Other long term (current) drug therapy: Secondary | ICD-10-CM | POA: Diagnosis not present

## 2021-04-22 DIAGNOSIS — I10 Essential (primary) hypertension: Secondary | ICD-10-CM

## 2021-04-22 DIAGNOSIS — E039 Hypothyroidism, unspecified: Secondary | ICD-10-CM

## 2021-04-22 DIAGNOSIS — Z7189 Other specified counseling: Secondary | ICD-10-CM

## 2021-04-22 DIAGNOSIS — C773 Secondary and unspecified malignant neoplasm of axilla and upper limb lymph nodes: Secondary | ICD-10-CM

## 2021-04-22 DIAGNOSIS — Z5112 Encounter for antineoplastic immunotherapy: Secondary | ICD-10-CM | POA: Diagnosis not present

## 2021-04-22 LAB — TSH: TSH: 1.295 u[IU]/mL (ref 0.308–3.960)

## 2021-04-22 LAB — CBC WITH DIFFERENTIAL/PLATELET
Abs Immature Granulocytes: 0.01 10*3/uL (ref 0.00–0.07)
Basophils Absolute: 0 10*3/uL (ref 0.0–0.1)
Basophils Relative: 0 %
Eosinophils Absolute: 0 10*3/uL (ref 0.0–0.5)
Eosinophils Relative: 0 %
HCT: 42.8 % (ref 36.0–46.0)
Hemoglobin: 13.5 g/dL (ref 12.0–15.0)
Immature Granulocytes: 0 %
Lymphocytes Relative: 24 %
Lymphs Abs: 1.2 10*3/uL (ref 0.7–4.0)
MCH: 27.8 pg (ref 26.0–34.0)
MCHC: 31.5 g/dL (ref 30.0–36.0)
MCV: 88.1 fL (ref 80.0–100.0)
Monocytes Absolute: 0.2 10*3/uL (ref 0.1–1.0)
Monocytes Relative: 4 %
Neutro Abs: 3.4 10*3/uL (ref 1.7–7.7)
Neutrophils Relative %: 72 %
Platelets: 180 10*3/uL (ref 150–400)
RBC: 4.86 MIL/uL (ref 3.87–5.11)
RDW: 14.8 % (ref 11.5–15.5)
WBC: 4.8 10*3/uL (ref 4.0–10.5)
nRBC: 0 % (ref 0.0–0.2)

## 2021-04-22 LAB — COMPREHENSIVE METABOLIC PANEL
ALT: 14 U/L (ref 0–44)
AST: 15 U/L (ref 15–41)
Albumin: 3.5 g/dL (ref 3.5–5.0)
Alkaline Phosphatase: 70 U/L (ref 38–126)
Anion gap: 7 (ref 5–15)
BUN: 21 mg/dL (ref 8–23)
CO2: 22 mmol/L (ref 22–32)
Calcium: 8.8 mg/dL — ABNORMAL LOW (ref 8.9–10.3)
Chloride: 113 mmol/L — ABNORMAL HIGH (ref 98–111)
Creatinine, Ser: 1.24 mg/dL — ABNORMAL HIGH (ref 0.44–1.00)
GFR, Estimated: 47 mL/min — ABNORMAL LOW (ref >60)
Glucose, Bld: 128 mg/dL — ABNORMAL HIGH (ref 70–99)
Potassium: 3.8 mmol/L (ref 3.5–5.1)
Sodium: 142 mmol/L (ref 135–145)
Total Bilirubin: 0.4 mg/dL (ref 0.3–1.2)
Total Protein: 6.1 g/dL — ABNORMAL LOW (ref 6.5–8.1)

## 2021-04-22 MED ORDER — SODIUM CHLORIDE 0.9 % IV SOLN: Freq: Once | INTRAVENOUS | Status: AC

## 2021-04-22 MED ORDER — HEPARIN SOD (PORK) LOCK FLUSH 100 UNIT/ML IV SOLN
500.0000 [IU] | Freq: Once | INTRAVENOUS | Status: AC | PRN
  Administered 2021-04-22: 500 [IU]

## 2021-04-22 MED ORDER — SODIUM CHLORIDE 0.9% FLUSH
10.0000 mL | Freq: Once | INTRAVENOUS | Status: AC
  Administered 2021-04-22: 10 mL

## 2021-04-22 MED ORDER — SODIUM CHLORIDE 0.9 % IV SOLN
200.0000 mg | Freq: Once | INTRAVENOUS | Status: AC
  Administered 2021-04-22: 200 mg via INTRAVENOUS
  Filled 2021-04-22: qty 8

## 2021-04-22 MED ORDER — SODIUM CHLORIDE 0.9% FLUSH
10.0000 mL | INTRAVENOUS | Status: DC | PRN
  Administered 2021-04-22: 10 mL

## 2021-04-22 NOTE — Assessment & Plan Note (Signed)
She has fluctuation of BP Her blood pressure at home is usually within normal range Observe closely I will defer to her primary care for further management

## 2021-04-22 NOTE — Patient Instructions (Signed)
[facility]  ° Discharge Instructions: °Thank you for choosing [facility] to provide your oncology and hematology care.  ° °If you have a lab appointment with the Cancer Center, please go directly to the Cancer Center and check in at the registration area. °  °Wear comfortable clothing and clothing appropriate for easy access to any Portacath or PICC line.  ° °We strive to give you quality time with your provider. You may need to reschedule your appointment if you arrive late (15 or more minutes).  Arriving late affects you and other patients whose appointments are after yours.  Also, if you miss three or more appointments without notifying the office, you may be dismissed from the clinic at the provider’s discretion.    °  °For prescription refill requests, have your pharmacy contact our office and allow 72 hours for refills to be completed.   ° °Today you received the following chemotherapy and/or immunotherapy agents: Pembrolizumab (Keytruda) °  °To help prevent nausea and vomiting after your treatment, we encourage you to take your nausea medication as directed. ° °BELOW ARE SYMPTOMS THAT SHOULD BE REPORTED IMMEDIATELY: °*FEVER GREATER THAN 100.4 F (38 °C) OR HIGHER °*CHILLS OR SWEATING °*NAUSEA AND VOMITING THAT IS NOT CONTROLLED WITH YOUR NAUSEA MEDICATION °*UNUSUAL SHORTNESS OF BREATH °*UNUSUAL BRUISING OR BLEEDING °*URINARY PROBLEMS (pain or burning when urinating, or frequent urination) °*BOWEL PROBLEMS (unusual diarrhea, constipation, pain near the anus) °TENDERNESS IN MOUTH AND THROAT WITH OR WITHOUT PRESENCE OF ULCERS (sore throat, sores in mouth, or a toothache) °UNUSUAL RASH, SWELLING OR PAIN  °UNUSUAL VAGINAL DISCHARGE OR ITCHING  ° °Items with * indicate a potential emergency and should be followed up as soon as possible or go to the Emergency Department if any problems should occur. ° °Please show the CHEMOTHERAPY ALERT CARD or IMMUNOTHERAPY ALERT CARD  at check-in to the Emergency Department and triage nurse. ° °Should you have questions after your visit or need to cancel or reschedule your appointment, please contact [facility]  Dept: [phone]  and follow the prompts.  Office hours are 8:00 a.m. to 4:30 p.m. Monday - Friday. Please note that voicemails left after 4:00 p.m. may not be returned until the following business day.  We are closed weekends and major holidays. You have access to a nurse at all times for urgent questions. Please call the main number to the clinic Dept: [phone] and follow the prompts. ° ° °For any non-urgent questions, you may also contact your provider using MyChart. We now offer e-Visits for anyone 18 and older to request care online for non-urgent symptoms. For details visit [email or web address]. °  °Also download the MyChart app! Go to the app store, search "MyChart", open the app, select [facility], and log in with your MyChart username and password. ° °Due to Covid, a mask is required upon entering the hospital/clinic. If you do not have a mask, one will be given to you upon arrival. For doctor visits, patients may have 1 support person aged 18 or older with them. For treatment visits, patients cannot have anyone with them due to current Covid guidelines and our immunocompromised population.  ° °

## 2021-04-22 NOTE — Assessment & Plan Note (Signed)
Recent renal function fluctuates up and down We discussed the importance of aggressive risk factor modification and to drink plenty of oral fluids 

## 2021-04-22 NOTE — Progress Notes (Signed)
[facility] OFFICE PROGRESS NOTE  Patient Care Team: Jonathon Jordan, MD as PCP - General (Family Medicine) Awanda Mink Craige Cotta, RN as Registered Nurse (Oncology)  ASSESSMENT & PLAN:  Endometrial cancer [facility]) Her last CT imaging showed stability of the lymph node in the axilla, asymptomatic She tolerated treatment well except for acquired hypothyroidism and intermittent uncontrolled hypertension I plan to space out imaging study to 4 to 6 months in the future  Essential hypertension She has fluctuation of BP Her blood pressure at home is usually within normal range Observe closely I will defer to her primary care for further management  Chronic kidney disease (CKD), stage III (moderate) ([place]) Recent renal function fluctuates up and down We discussed the importance of aggressive risk factor modification and to drink plenty of oral fluids  No orders of the defined types were placed in this encounter.   All questions were answered. The patient knows to call the clinic with any problems, questions or concerns. The total time spent in the appointment was 25 minutes encounter with patients including review of chart and various tests results, discussions about plan of care and coordination of care plan   [name], MD 04/22/2021 12:41 PM  INTERVAL HISTORY: Please see below for problem oriented charting. she returns for treatment follow-up on treatment with lenvatinib and pembrolizumab for recurrent endometrial cancer She is doing well Her blood pressure from home is generally within normal range She have no new side effects from treatment so far  REVIEW OF SYSTEMS:   Constitutional: Denies fevers, chills or abnormal weight loss Eyes: Denies blurriness of vision Ears, nose, mouth, throat, and face: Denies mucositis or sore throat Respiratory: Denies cough, dyspnea or wheezes Cardiovascular: Denies palpitation, chest discomfort or lower extremity swelling Gastrointestinal:   Denies nausea, heartburn or change in bowel habits Skin: Denies abnormal skin rashes Lymphatics: Denies new lymphadenopathy or easy bruising Neurological:Denies numbness, tingling or new weaknesses Behavioral/Psych: Mood is stable, no new changes  All other systems were reviewed with the patient and are negative.  I have reviewed the past medical history, past surgical history, social history and family history with the patient and they are unchanged from previous note.  ALLERGIES:  is allergic to sulfa antibiotics and sulfamethoxazole.  MEDICATIONS:  Current Outpatient Medications  Medication Sig Dispense Refill   amLODipine-valsartan (EXFORGE) 5-160 MG tablet Take 1 tablet by mouth daily.     Calcium Carb-Cholecalciferol 600-800 MG-UNIT TABS Take 1 tablet by mouth daily.     ergocalciferol (VITAMIN D2) 1.25 MG (50000 UT) capsule Take 1 capsule (50,000 Units total) by mouth once a week. 12 capsule 3   lenvatinib 10 mg daily dose (LENVIMA, 10 MG DAILY DOSE,) capsule Take 1 capsule (10 mg total) by mouth daily. 30 capsule 11   levothyroxine (SYNTHROID) 175 MCG tablet Take 1 tablet (175 mcg total) by mouth daily before breakfast. 30 tablet 1   lidocaine-prilocaine (EMLA) cream Apply topically daily as needed. 30 g 3   OneTouch Delica Lancets 54G MISC 1 each by Other route 2 (two) times daily. Use to monitor glucose levels BID; E11.65 100 each 2   ONETOUCH VERIO test strip 1 each by Other route 3 (three) times daily. And lancets 3/day 300 each 3   RELION PEN NEEDLES 32G X 4 MM MISC USE 1 PEN PER DAY 50 each 5   simvastatin (ZOCOR) 10 MG tablet Take 10 mg by mouth at bedtime.      No current facility-administered medications for  this visit.   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 Leo Grosser who obtained an endometrial biopsy showing poorly differentiated endometrial carcinoma and negative endocervical curettage   12/12/2017 Imaging   MAMMOGRAM FINDINGS: In the right axilla, a possible mass warrants further evaluation. In the left breast, no findings suspicious for malignancy.   Images were processed with CAD.   IMPRESSION: Further evaluation is suggested for possible mass in the right axilla.     12/24/2017 Imaging   Ct scan chest, abdomen and pelvis 1. Marked thickening of the endometrium (42 mm) compatible with known primary endometrial malignancy. No evidence of extrauterine invasion. 2. No pelvic or retroperitoneal adenopathy. 3. Right middle lobe 4 mm solid pulmonary nodule, for which follow-up chest CT is advised in 3-6 months. 4. Several findings that are equivocal for distant metastatic disease. Vaguely nodular heterogeneous hyperenhancement in the peripheral right liver lobe, which could represent benign transient perfusional phenomena, with underlying liver lesions not entirely excluded. Mildly sclerotic T12 vertebral lesion. Mildly enlarged right axillary lymph node. The best single test to further evaluate these findings would be a PET-CT. Alternative tests include bone scan or thoracic MRI without and with IV contrast for the T12 osseous lesion, MRI abdomen without and with IV contrast for the liver findings, and diagnostic mammographic evaluation for the right axillary node. 5.  Aortic Atherosclerosis (ICD10-I70.0).     01/09/2018 PET scan   1. Moderate hypermetabolism corresponding to enlarging axillary node since 12/22/2017. Highly suspicious  for an atypical distribution of metastatic disease. 2. No hypermetabolism to suggest hepatic or T12 osseous metastasis. 3. Hypermetabolic endometrial primary.   01/23/2018 Initial Diagnosis   Endometrial cancer ([place])   01/23/2018 Pathology Results   1. Lymph node, sentinel, biopsy, left external iliac - NO CARCINOMA IDENTIFIED IN ONE LYMPH NODE (0/1) - SEE COMMENT 2. Lymph nodes, regional resection, right para aortic - NO CARCINOMA IDENTIFIED IN FOUR LYMPH NODES (0/4) - SEE COMMENT 3. Lymph nodes, regional resection, right pelvic - NO CARCINOMA IDENTIFIED IN EIGHT LYMPH NODES (0/8) - SEE COMMENT 4. Cul-de-sac biopsy - METASTATIC CARCINOMA 5. Uterus +/- tubes/ovaries, neoplastic, cervix, bilateral fallopian tubes and ovaries UTERUS: - MIXED SEROUS AND ENDOMETRIOID CARCINOMA - SEROSAL IMPLANTS PRESENT - LYMPHOVASCULAR SPACE INVASION PRESENT - LEIOMYOMATA (1.5 CM; LARGEST) - SEE ONCOLOGY TABLE AND COMMENT BELOW CERVIX: - BENIGN NABOTHIAN CYSTS - NO CARCINOMA IDENTIFIED BILATERAL OVARIES: - METASTATIC CARCINOMA PRESENT ON OVARIAN SURFACE BILATERAL FALLOPIAN TUBES: - INTRALUMINAL CARCINOMA Microscopic Comment 1. -3. Cytokeratin AE1/3 was performed on the sentinel lymph nodes to exclude micrometastasis. There is no evidence of metastatic carcinoma by immunohistochemistry. 5. 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%) Histologic Grade: N/A Myometrial invasion: Estimated less than 50% myometrial invasion (0.3 cm of myometrium involved; 1.4 cm measured thickness) Uterine Serosa Involvement: Present Cervical stromal involvement: Not identified  Extent of involvement of other organs: - Fallopian tube (left within the lumen) - Ovary, left (surface  involvement) - Cul-de-sac Lymphovascular invasion: Present Regional Lymph Nodes: Examined: 1 Sentinel 12 Non-sentinel 13 Total Lymph nodes with metastasis: 0 Isolated tumor cells (< 0.2 mm): 0 Micrometastasis: (> 0.2 mm and < 2.0 mm): 0 Macrometastasis: (> 2.0 mm): 0 Extracapsular extension: N/A Tumor block for ancillary studies: 5E, 5B MMR / MSI testing: Pending will be reported separately Pathologic Stage Classification (pTNM, AJCC 8th edition): pT3a, pN0 FIGO Stage: IIIA COMMENT: There is tumor present on the surface of the left ovary and within the lumen of the left fallopian tube. The carcinoma appears to be mixed with the largest component being high grade serous carcinoma. Dr. Lyndon Code reviewed the case and agrees with the above diagnosis.   01/23/2018 Surgery   Surgeon: Donaciano Eva     Operation: Robotic-assisted laparoscopic total hysterectomy with bilateral salpingoophorectomy, SLN injection, mapping and biopsy, right pelvic and para-aortic lymphadenectomy   Operative Findings:  : 10-12cm bulky uterus with frank serosal involvement on posterior cul de sac peritoneum and anterior peritoneum which adhesed the bladder to the anterior uterus. Unilateral mapping on left pelvis. No grossly suspicious nodes. Normal omentum and diaphragms.    02/01/2018 Pathology Results   Lymph node, needle/core biopsy, right axilla - METASTATIC CARCINOMA - SEE COMMENT Microscopic Comment The neoplastic cells are positive for cytokeratin 7 and Pax-8 but negative for cytokeratin 5/6, cytokeratin 20, Gata-3, p63, p53 and GCDFP. Overall, the immunoprofile is consistent with metastasis from the patient's known gynecologic carcinoma.   02/01/2018 Procedure   Ultrasound-guided core biopsies of a suspicious right axillary lymph node.   02/21/2018 - 06/13/2018 Chemotherapy   The patient had carboplatin & Taxol x 6   03/26/2018 - 04/30/2018 Radiation Therapy   Radiation treatment dates:   03/26/18,  04/02/18, 04/19/18, 04/23/18 04/30/18   Site/dose: proximal Vagina, 6 Gy in 5 fractions for a total dose of 30 Gy     04/26/2018 PET scan   1. Interval resection of the hypermetabolic endometrial primary. No discernible hypermetabolic pelvic sidewall or peritoneal metastases. 2. Stable hypermetabolic bilateral axillary lymphadenopathy. The degree of hypermetabolism in these lymph nodes remains highly suspicious for neoplasm. 3. Interval development of low level FDG uptake in 2 stable, small lymph nodes in the left groin region. This may be reactive, but close attention recommended to exclude metastatic involvement. 4. Stable non hypermetabolic low-density liver lesions and the mixed lucent and sclerotic T12 lesion is stable without hypermetabolism today.   07/09/2018 Imaging   Status post hysterectomy.   Mild axillary lymphadenopathy, improved. Nodal metastases not excluded.   Irregular wall thickening involving the anterior bladder, possibly reflecting radiation cystitis versus tumor.   Additional stable findings as above, including a 5 mm right middle lobe nodule and stable sclerosis involving the T12 vertebral body.   10/10/2018 Imaging   1. Stable exam. No findings within the abdomen or pelvis to suggest metastatic disease. 2. Unchanged appearance of sclerosis involving the T12 vertebra. 3.  Aortic Atherosclerosis (ICD10-I70.0).   10/10/2018 Imaging   Ct abdomen and pelvis 1. Stable exam. No findings within the abdomen or pelvis to suggest metastatic disease. 2. Unchanged appearance of sclerosis involving the T12 vertebra. 3.  Aortic Atherosclerosis (ICD10-I70.0).   01/16/2019 PET scan   1. Enlargement and increased activity in the left axillary, right axillary, and left inguinal adenopathy compatible with progressive malignancy. 2. Stable 4 mm right middle lobe subpleural nodule, not appreciably  hypermetabolic. 3. Other imaging findings of potential clinical significance: Right kidney  lower pole cyst. Aortic Atherosclerosis (ICD10-I70.0). Prominent stool throughout the colon favors constipation. Mild chronic left maxillary sinusitis.     02/01/2019 -  Chemotherapy   The patient had pembrolizumab and Lenvima for chemotherapy treatment.     04/25/2019 Imaging   Ct imaging 1. Interval response to therapy. Decrease in size of bilateral axillary and left inguinal lymph nodes. No new or progressive findings identified. 2. Stable sclerotic appearance of the T12 vertebra. 3. Aortic atherosclerosis. Lad coronary artery calcification noted.   08/30/2019 Imaging   1. Bulky RIGHT hilar lymph node, slightly increased in size from previous imaging. 2. Multiple foci of hyperenhancement in the liver. 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 Consider MRI liver on follow-up to establish a baseline for number of lesions as these will appear variable on CT follow-up based on phase of contrast acquisition in the future. 3. Stable 5 mm nodule adjacent to the fissure, minor fissure in the RIGHT middle lobe.     11/28/2019 Imaging   1. Stable exam. No new or progressive findings. 2. Stable enlarged right axillary lymph node. 3. Stable tiny bilateral pulmonary nodules. Continued attention on follow-up recommended. 4. Scattered hypodensities in the liver parenchyma correspond to the hypervascular lesions seen on the previous study performed with intravenous contrast material. 5. Right renal cyst. 6. Aortic Atherosclerosis (ICD10-I70.0).     03/16/2020 Imaging   Increased echogenicity of renal parenchyma is noted bilaterally suggesting medical renal disease. No hydronephrosis or renal obstruction is noted   04/02/2020 Imaging   1. Right axillary lymphadenopathy is stable. Tiny bilateral pulmonary  nodules are stable. Subcentimeter low-attenuation liver lesions are stable. 2. No new or progressive metastatic disease in the chest, abdomen or pelvis. 3. Aortic Atherosclerosis (ICD10-I70.0).   09/15/2020 Imaging   1. Stable RIGHT axillary lymph node with enlargement measuring 2 cm short axis. 2. Stable small nodule along the minor fissure in the RIGHT chest. 3. No new or progressive findings. 4. Aortic atherosclerosis.   03/24/2021 Imaging   1. Right axillary lymphadenopathy is stable compared to prior studies. 2. No other definite signs of metastatic disease elsewhere in the thorax. 3. Hepatic steatosis. Numerous hypervascular areas noted in the liver, incompletely characterized on today's arterial phase examination. These are similar to remote prior examination from 08/30/2019, likely to represent small benign lesions such as flash fill cavernous hemangiomas. These could be definitively evaluated with nonemergent abdominal MRI with and without IV gadolinium if of clinical concern. 4. Aortic atherosclerosis, in addition to left main and left anterior descending coronary artery disease. Assessment for potential risk factor modification, dietary therapy or pharmacologic therapy may be warranted, if clinically indicated.   Aortic Atherosclerosis (ICD10-I70.0).     PHYSICAL EXAMINATION: ECOG PERFORMANCE STATUS: 0 - Asymptomatic  Vitals:   04/22/21 0912  BP: (!) 159/103  Pulse: 91  Resp: 18  Temp: 97.9 F (36.6 C)  SpO2: 100%   Filed Weights   04/22/21 0912  Weight: 161 lb 3.2 oz (73.1 kg)    GENERAL:alert, no distress and comfortable SKIN: skin color, texture, turgor are normal, no rashes or significant lesions EYES: normal, Conjunctiva are pink and non-injected, sclera clear OROPHARYNX:no exudate, no erythema and lips, buccal mucosa, and tongue normal  NECK: supple, thyroid normal size, non-tender, without nodularity LYMPH:  no palpable lymphadenopathy in the cervical,  axillary or inguinal LUNGS: clear to auscultation and percussion with normal breathing effort HEART: regular rate & rhythm and no murmurs and no lower extremity edema ABDOMEN:abdomen soft, non-tender and normal bowel sounds Musculoskeletal:no cyanosis of digits and no clubbing  NEURO: alert & oriented x 3 with fluent speech, no focal motor/sensory deficits  LABORATORY DATA:  I have reviewed the data as listed    Component Value Date/Time   NA 142 04/22/2021 0850   K 3.8 04/22/2021 0850   CL 113 (H) 04/22/2021 0850   CO2 22 04/22/2021 0850   GLUCOSE 128 (H) 04/22/2021 0850   BUN 21 04/22/2021 0850   CREATININE 1.24 (H) 04/22/2021 0850   CREATININE 1.75 (H) 01/10/2020 1200   CALCIUM 8.8 (L) 04/22/2021 0850   PROT 6.1 (L) 04/22/2021 0850   ALBUMIN 3.5 04/22/2021 0850   AST 15 04/22/2021 0850   AST 13 (L) 01/10/2020 1200   ALT 14 04/22/2021 0850   ALT 11 01/10/2020 1200   ALKPHOS 70 04/22/2021 0850   BILITOT 0.4 04/22/2021 0850   BILITOT 0.4 01/10/2020 1200   GFRNONAA 47 (L) 04/22/2021 0850   GFRNONAA 29 (L) 01/10/2020 1200   GFRAA 34 (L) 01/10/2020 1200    No results found for: SPEP, UPEP  Lab Results  Component Value Date   WBC 4.8 04/22/2021   NEUTROABS 3.4 04/22/2021   HGB 13.5 04/22/2021   HCT 42.8 04/22/2021   MCV 88.1 04/22/2021   PLT 180 04/22/2021      Chemistry      Component Value Date/Time   NA 142 04/22/2021 0850   K 3.8 04/22/2021 0850   CL 113 (H) 04/22/2021 0850   CO2 22 04/22/2021 0850   BUN 21 04/22/2021 0850   CREATININE 1.24 (H) 04/22/2021 0850   CREATININE 1.75 (H) 01/10/2020 1200      Component Value Date/Time   CALCIUM 8.8 (L) 04/22/2021 0850   ALKPHOS 70 04/22/2021 0850   AST 15 04/22/2021 0850   AST 13 (L) 01/10/2020 1200   ALT 14 04/22/2021 0850   ALT 11 01/10/2020 1200   BILITOT 0.4 04/22/2021 0850   BILITOT 0.4 01/10/2020 1200       RADIOGRAPHIC STUDIES: I have personally reviewed the radiological images as listed and  agreed with the findings in the report. MM 3D SCREEN BREAST BILATERAL  Result Date: 04/02/2021 CLINICAL DATA:  Screening. EXAM: DIGITAL SCREENING BILATERAL MAMMOGRAM WITH TOMOSYNTHESIS AND CAD TECHNIQUE: Bilateral screening digital craniocaudal and mediolateral oblique mammograms were obtained. Bilateral screening digital breast tomosynthesis was performed. The images were evaluated with computer-aided detection. COMPARISON:  Previous exam(s). ACR Breast Density Category b: There are scattered areas of fibroglandular density. FINDINGS: There are no findings suspicious for malignancy. IMPRESSION: No mammographic evidence of malignancy. A result letter of this screening mammogram will be mailed directly to the patient. RECOMMENDATION: Screening mammogram in one year. (Code:[id number]-B-01Y) BI-RADS CATEGORY  1: Negative. Electronically Signed   By: Evangeline Dakin M.D.   On: 04/02/2021 09:16

## 2021-04-22 NOTE — Assessment & Plan Note (Signed)
Her last CT imaging showed stability of the lymph node in the axilla, asymptomatic She tolerated treatment well except for acquired hypothyroidism and intermittent uncontrolled hypertension I plan to space out imaging study to 4 to 6 months in the future

## 2021-04-26 ENCOUNTER — Encounter: Payer: Self-pay | Admitting: Podiatry

## 2021-04-26 ENCOUNTER — Other Ambulatory Visit: Payer: Self-pay

## 2021-04-26 ENCOUNTER — Ambulatory Visit (INDEPENDENT_AMBULATORY_CARE_PROVIDER_SITE_OTHER): Payer: Medicare Other | Admitting: Podiatry

## 2021-04-26 DIAGNOSIS — L84 Corns and callosities: Secondary | ICD-10-CM

## 2021-04-26 DIAGNOSIS — Q828 Other specified congenital malformations of skin: Secondary | ICD-10-CM

## 2021-04-26 DIAGNOSIS — E0822 Diabetes mellitus due to underlying condition with diabetic chronic kidney disease: Secondary | ICD-10-CM | POA: Diagnosis not present

## 2021-04-26 DIAGNOSIS — M79674 Pain in right toe(s): Secondary | ICD-10-CM | POA: Diagnosis not present

## 2021-04-26 DIAGNOSIS — N183 Chronic kidney disease, stage 3 unspecified: Secondary | ICD-10-CM | POA: Diagnosis not present

## 2021-04-26 DIAGNOSIS — B351 Tinea unguium: Secondary | ICD-10-CM

## 2021-04-26 DIAGNOSIS — M79675 Pain in left toe(s): Secondary | ICD-10-CM | POA: Diagnosis not present

## 2021-04-26 DIAGNOSIS — Z794 Long term (current) use of insulin: Secondary | ICD-10-CM | POA: Diagnosis not present

## 2021-04-30 DIAGNOSIS — Z23 Encounter for immunization: Secondary | ICD-10-CM | POA: Diagnosis not present

## 2021-04-30 NOTE — Progress Notes (Signed)
°  Subjective:  Patient ID: Krista Johnson, female    DOB: 07/05/1950,  MRN: [id number]  71 y.o. female presents with at risk foot care. Pt has h/o NIDDM with chronic kidney disease and corn(s) bilaterally , callus(es) bilaterally and painful mycotic nails.  Pain interferes with ambulation. Aggravating factors include wearing enclosed shoe gear. Painful toenails interfere with ambulation. Aggravating factors include wearing enclosed shoe gear. Pain is relieved with periodic professional debridement. Painful corns and calluses are aggravated when weightbearing with and without shoegear. Pain is relieved with periodic professional debridement..    Patient's blood sugar was 91 mg/dl today.   PCP: Jonathon Jordan, MD and last visit was: September or October, 2022 per patient recall.  Review of Systems: Negative except as noted in the HPI.   Allergies  Allergen Reactions   Sulfa Antibiotics Nausea Only   Sulfamethoxazole Nausea Only    Objective:  There were no vitals filed for this visit. Constitutional Patient is a pleasant 71 y.o. African American female WD, WN in NAD. AAO x 3.  Vascular Capillary fill time to digits immediate b/l.  DP/PT pulse(s) are palpable b/l lower extremities. Pedal hair sparse. Lower extremity skin temperature gradient within normal limits. No pain with calf compression b/l. No edema noted b/l lower extremities. No cyanosis or clubbing noted.   Neurologic Protective sensation intact 5/5 intact bilaterally with 10g monofilament b/l. Vibratory sensation intact b/l. No clonus b/l. Pt has subjective symptoms of neuropathy.  Dermatologic Pedal skin is warm and supple b/l.  No open wounds b/l lower extremities. No interdigital macerations b/l lower extremities. Toenails 1-5 b/l elongated, discolored, dystrophic, thickened, crumbly with subungual debris and tenderness to dorsal palpation. Hyperkeratotic lesion(s) R 5th toe.  No erythema, no edema, no drainage, no fluctuance.  Porokeratotic lesion(s) submet head 4 left foot. No erythema, no edema, no drainage, no fluctuance.  Orthopedic: Normal muscle strength 5/5 to all lower extremity muscle groups bilaterally. Patient ambulates independent of any assistive aids. Normal muscle strength 5/5 to all lower extremity muscle groups bilaterally. Hammertoe(s) noted to the bilateral 5th toes.. No pain, crepitus or joint limitation noted with ROM b/l LE.  Patient ambulates independently without assistive aids.   Hemoglobin A1C Latest Ref Rng & Units 01/21/2021 10/20/2020 07/22/2020  HGBA1C 4.0 - 5.6 % 5.8(A) 6.6(A) 5.9(A)  Some recent data might be hidden    Assessment:   1. Pain due to onychomycosis of toenails of both feet   2. Porokeratosis   3. Corns   4. 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 Consent given for treatment as described below: -Mycotic toenails 1-5 bilaterally were debrided in length and girth with sterile nail nippers and dremel without incident. -Corn(s) R 5th toe pared utilizing sterile scalpel blade without complication or incident. Total number debrided=1. -Painful porokeratotic lesion(s) submet head 4 left foot and submet head 5 right foot pared and enucleated with sterile scalpel blade without incident. Total number of lesions debrided=2. -Patient/POA to call should there be question/concern in the interim.  Return in about 3 months (around 07/25/2021).  Marzetta Board, DPM

## 2021-05-11 NOTE — Telephone Encounter (Signed)
Patient is approved for L[place] at no cost from [facility] 04/18/21-04/17/22  [place] uses [place] [facility] [place] Phone [phone] Fax [phone] 05/11/2021 10:31 AM

## 2021-05-13 ENCOUNTER — Telehealth: Payer: Self-pay

## 2021-05-13 ENCOUNTER — Inpatient Hospital Stay: Payer: Medicare Other

## 2021-05-13 ENCOUNTER — Ambulatory Visit: Payer: Medicare Other

## 2021-05-13 ENCOUNTER — Other Ambulatory Visit: Payer: Medicare Other

## 2021-05-13 ENCOUNTER — Other Ambulatory Visit: Payer: Self-pay

## 2021-05-13 ENCOUNTER — Ambulatory Visit: Payer: Medicare Other | Admitting: Hematology and Oncology

## 2021-05-13 VITALS — BP 139/100 | HR 85 | Temp 98.7°F | Resp 18 | Ht 66.0 in | Wt 157.5 lb

## 2021-05-13 DIAGNOSIS — C541 Malignant neoplasm of endometrium: Secondary | ICD-10-CM

## 2021-05-13 DIAGNOSIS — Z5112 Encounter for antineoplastic immunotherapy: Secondary | ICD-10-CM | POA: Diagnosis not present

## 2021-05-13 DIAGNOSIS — Z7189 Other specified counseling: Secondary | ICD-10-CM

## 2021-05-13 DIAGNOSIS — Z79899 Other long term (current) drug therapy: Secondary | ICD-10-CM | POA: Diagnosis not present

## 2021-05-13 DIAGNOSIS — C773 Secondary and unspecified malignant neoplasm of axilla and upper limb lymph nodes: Secondary | ICD-10-CM

## 2021-05-13 LAB — COMPREHENSIVE METABOLIC PANEL
ALT: 12 U/L (ref 0–44)
AST: 14 U/L — ABNORMAL LOW (ref 15–41)
Albumin: 3.5 g/dL (ref 3.5–5.0)
Alkaline Phosphatase: 68 U/L (ref 38–126)
Anion gap: 8 (ref 5–15)
BUN: 19 mg/dL (ref 8–23)
CO2: 25 mmol/L (ref 22–32)
Calcium: 8.6 mg/dL — ABNORMAL LOW (ref 8.9–10.3)
Chloride: 110 mmol/L (ref 98–111)
Creatinine, Ser: 1.31 mg/dL — ABNORMAL HIGH (ref 0.44–1.00)
GFR, Estimated: 44 mL/min — ABNORMAL LOW (ref >60)
Glucose, Bld: 123 mg/dL — ABNORMAL HIGH (ref 70–99)
Potassium: 3.3 mmol/L — ABNORMAL LOW (ref 3.5–5.1)
Sodium: 143 mmol/L (ref 135–145)
Total Bilirubin: 0.5 mg/dL (ref 0.3–1.2)
Total Protein: 6.1 g/dL — ABNORMAL LOW (ref 6.5–8.1)

## 2021-05-13 LAB — CBC WITH DIFFERENTIAL/PLATELET
Abs Immature Granulocytes: 0.01 10*3/uL (ref 0.00–0.07)
Basophils Absolute: 0 10*3/uL (ref 0.0–0.1)
Basophils Relative: 0 %
Eosinophils Absolute: 0 10*3/uL (ref 0.0–0.5)
Eosinophils Relative: 1 %
HCT: 42.5 % (ref 36.0–46.0)
Hemoglobin: 13.5 g/dL (ref 12.0–15.0)
Immature Granulocytes: 0 %
Lymphocytes Relative: 33 %
Lymphs Abs: 1.6 10*3/uL (ref 0.7–4.0)
MCH: 27.5 pg (ref 26.0–34.0)
MCHC: 31.8 g/dL (ref 30.0–36.0)
MCV: 86.6 fL (ref 80.0–100.0)
Monocytes Absolute: 0.2 10*3/uL (ref 0.1–1.0)
Monocytes Relative: 5 %
Neutro Abs: 2.9 10*3/uL (ref 1.7–7.7)
Neutrophils Relative %: 61 %
Platelets: 214 10*3/uL (ref 150–400)
RBC: 4.91 MIL/uL (ref 3.87–5.11)
RDW: 14.8 % (ref 11.5–15.5)
WBC: 4.7 10*3/uL (ref 4.0–10.5)
nRBC: 0 % (ref 0.0–0.2)

## 2021-05-13 MED ORDER — SODIUM CHLORIDE 0.9% FLUSH
10.0000 mL | Freq: Once | INTRAVENOUS | Status: AC
  Administered 2021-05-13: 10 mL

## 2021-05-13 MED ORDER — SODIUM CHLORIDE 0.9 % IV SOLN: Freq: Once | INTRAVENOUS | Status: AC

## 2021-05-13 MED ORDER — SODIUM CHLORIDE 0.9 % IV SOLN
200.0000 mg | Freq: Once | INTRAVENOUS | Status: AC
  Administered 2021-05-13: 200 mg via INTRAVENOUS
  Filled 2021-05-13: qty 200

## 2021-05-13 MED ORDER — HEPARIN SOD (PORK) LOCK FLUSH 100 UNIT/ML IV SOLN
500.0000 [IU] | Freq: Once | INTRAVENOUS | Status: AC | PRN
  Administered 2021-05-13: 500 [IU]

## 2021-05-13 MED ORDER — SODIUM CHLORIDE 0.9% FLUSH
10.0000 mL | INTRAVENOUS | Status: DC | PRN
  Administered 2021-05-13: 10 mL

## 2021-05-13 NOTE — Patient Instructions (Signed)
[facility]  Discharge Instructions: Thank you for choosing [facility] to provide your oncology and hematology care.   If you have a lab appointment with the Cancer Center, please go directly to the Cancer Center and check in at the registration area.   Wear comfortable clothing and clothing appropriate for easy access to any Portacath or PICC line.   We strive to give you quality time with your provider. You may need to reschedule your appointment if you arrive late (15 or more minutes).  Arriving late affects you and other patients whose appointments are after yours.  Also, if you miss three or more appointments without notifying the office, you may be dismissed from the clinic at the provider's discretion.      For prescription refill requests, have your pharmacy contact our office and allow 72 hours for refills to be completed.    Today you received the following chemotherapy and/or immunotherapy agents: keytruda      To help prevent nausea and vomiting after your treatment, we encourage you to take your nausea medication as directed.  BELOW ARE SYMPTOMS THAT SHOULD BE REPORTED IMMEDIATELY: *FEVER GREATER THAN 100.4 F (38 C) OR HIGHER *CHILLS OR SWEATING *NAUSEA AND VOMITING THAT IS NOT CONTROLLED WITH YOUR NAUSEA MEDICATION *UNUSUAL SHORTNESS OF BREATH *UNUSUAL BRUISING OR BLEEDING *URINARY PROBLEMS (pain or burning when urinating, or frequent urination) *BOWEL PROBLEMS (unusual diarrhea, constipation, pain near the anus) TENDERNESS IN MOUTH AND THROAT WITH OR WITHOUT PRESENCE OF ULCERS (sore throat, sores in mouth, or a toothache) UNUSUAL RASH, SWELLING OR PAIN  UNUSUAL VAGINAL DISCHARGE OR ITCHING   Items with * indicate a potential emergency and should be followed up as soon as possible or go to the Emergency Department if any problems should occur.  Please show the CHEMOTHERAPY ALERT CARD or IMMUNOTHERAPY ALERT CARD at check-in to  the Emergency Department and triage nurse.  Should you have questions after your visit or need to cancel or reschedule your appointment, please contact [facility]  Dept: [phone]  and follow the prompts.  Office hours are 8:00 a.m. to 4:30 p.m. Monday - Friday. Please note that voicemails left after 4:00 p.m. may not be returned until the following business day.  We are closed weekends and major holidays. You have access to a nurse at all times for urgent questions. Please call the main number to the clinic Dept: [phone] and follow the prompts.   For any non-urgent questions, you may also contact your provider using MyChart. We now offer e-Visits for anyone 18 and older to request care online for non-urgent symptoms. For details visit [email or web address].   Also download the MyChart app! Go to the app store, search "MyChart", open the app, select , and log in with your MyChart username and password.  Due to Covid, a mask is required upon entering the hospital/clinic. If you do not have a mask, one will be given to you upon arrival. For doctor visits, patients may have 1 support person aged 18 or older with them. For treatment visits, patients cannot have anyone with them due to current Covid guidelines and our immunocompromised population.   

## 2021-05-13 NOTE — Telephone Encounter (Signed)
She came by the office after treatment today. She has been having some blood on tissue after bm with wiping since last week. History of hemorrhoids . Using preparation-H as needed. Instructed to use sitz bath prn and call the office back if needed. She verbalized understanding.

## 2021-05-14 ENCOUNTER — Other Ambulatory Visit: Payer: Self-pay

## 2021-05-14 ENCOUNTER — Telehealth: Payer: Self-pay

## 2021-05-14 DIAGNOSIS — E559 Vitamin D deficiency, unspecified: Secondary | ICD-10-CM

## 2021-05-14 NOTE — Telephone Encounter (Signed)
Returned her call and added vitamin D to the next lab appt. She is taking high dose vitamin D from her PCP, last vitamin lab was 9/16.

## 2021-05-17 ENCOUNTER — Other Ambulatory Visit: Payer: Self-pay | Admitting: Hematology and Oncology

## 2021-05-27 ENCOUNTER — Encounter: Payer: Self-pay | Admitting: Endocrinology

## 2021-05-27 ENCOUNTER — Telehealth: Payer: Self-pay

## 2021-05-27 ENCOUNTER — Ambulatory Visit (INDEPENDENT_AMBULATORY_CARE_PROVIDER_SITE_OTHER): Payer: Medicare Other | Admitting: Endocrinology

## 2021-05-27 ENCOUNTER — Other Ambulatory Visit: Payer: Self-pay

## 2021-05-27 VITALS — BP 176/92 | HR 96 | Ht 66.0 in | Wt 157.0 lb

## 2021-05-27 DIAGNOSIS — E559 Vitamin D deficiency, unspecified: Secondary | ICD-10-CM

## 2021-05-27 DIAGNOSIS — N1831 Chronic kidney disease, stage 3a: Secondary | ICD-10-CM

## 2021-05-27 DIAGNOSIS — E1122 Type 2 diabetes mellitus with diabetic chronic kidney disease: Secondary | ICD-10-CM

## 2021-05-27 DIAGNOSIS — Z794 Long term (current) use of insulin: Secondary | ICD-10-CM

## 2021-05-27 LAB — POCT GLYCOSYLATED HEMOGLOBIN (HGB A1C): Hemoglobin A1C: 6.2 % — AB (ref 4.0–5.6)

## 2021-05-27 LAB — VITAMIN D 25 HYDROXY (VIT D DEFICIENCY, FRACTURES): VITD: 18.07 ng/mL — ABNORMAL LOW (ref 30.00–100.00)

## 2021-05-27 MED ORDER — ONETOUCH VERIO VI STRP: 1.0000 | ORAL_STRIP | Freq: Every day | 3 refills | Status: DC

## 2021-05-27 MED ORDER — ONETOUCH DELICA LANCETS 33G MISC: 1.0000 | Freq: Two times a day (BID) | 2 refills | Status: DC

## 2021-05-27 NOTE — Progress Notes (Signed)
Subjective:    Patient ID: Krista Johnson, female    DOB: 08-06-1950, 71 y.o.   MRN: [id number]  HPI Pt returns for f/u of diabetes mellitus:  DM type: 2   Dx'ed: 4967 Complications: stage 3 CRI Therapy: no medication now GDM: never (G0) DKA: never Severe hypoglycemia: never Pancreatitis: never Pancreatic imaging: normal on 2019 CT.   Other: she took insulin 2019-2022; She is on Keytruda; fructosamine has confirmed A1c.   Interval history: she brings her meter with her cbg's which I have reviewed today.  CBG varies from 73-141.   Pt also has vit-D def and Hyperparathyroidism (prob a combination of primary and secondary).  She takes Vit-D, 50000 units/week  No recent steroids.   Past Medical History:  Diagnosis Date   Arthritis    right knee--- last cortisone injection 04/ 2019   CKD (chronic kidney disease)    Diabetes mellitus without complication [facility])    endocrinologist-  dr Loanne Drilling   Endometrial cancer [facility])    History of colon polyps    Hyperlipidemia    Hyperparathyroidism ([place])    Hypertension     Past Surgical History:  Procedure Laterality Date   COLONOSCOPY  last one 12-25-2017   IR IMAGING GUIDED PORT INSERTION  02/20/2018   ROBOTIC ASSISTED TOTAL HYSTERECTOMY WITH BILATERAL SALPINGO OOPHERECTOMY N/A 01/23/2018   Procedure: XI ROBOTIC ASSISTED TOTAL HYSTERECTOMY WITH BILATERAL SALPINGO OOPHORECTOMY;  Surgeon: Everitt Amber, MD;  Location: [facility] ORS;  Service: Gynecology;  Laterality: N/A;   SENTINEL NODE BIOPSY N/A 01/23/2018   Procedure: SENTINEL NODE BIOPSY;  Surgeon: Everitt Amber, MD;  Location: [facility];  Service: Gynecology;  Laterality: N/A;    Social History   Socioeconomic History   Marital status: Single    Spouse name: Not on file   Number of children: 0   Years of education: Not on file   Highest education level: Not on file  Occupational History   Occupation: retired Education officer, [employer]  Tobacco Use   Smoking status: Never   Smokeless tobacco: Never   Vaping Use   Vaping Use: Never used  Substance and Sexual Activity   Alcohol use: Not Currently    Alcohol/week: 0.0 standard drinks   Drug use: Never   Sexual activity: Not on file  Other Topics Concern   Not on file  Social History Narrative   Not on file   Social Determinants of Health   Financial Resource Strain: Not on file  Food Insecurity: Not on file  Transportation Needs: Not on file  Physical Activity: Not on file  Stress: Not on file  Social Connections: Not on file  Intimate Partner Violence: Not on file    Current Outpatient Medications on File Prior to Visit  Medication Sig Dispense Refill   amLODipine-valsartan (EXFORGE) 5-160 MG tablet Take 1 tablet by mouth daily.     Calcium Carb-Cholecalciferol 600-800 MG-UNIT TABS Take 1 tablet by mouth daily.     lenvatinib 10 mg daily dose (LENVIMA, 10 MG DAILY DOSE,) capsule Take 1 capsule (10 mg total) by mouth daily. 30 capsule 11   levothyroxine (SYNTHROID) 175 MCG tablet TAKE 1 TABLET BY MOUTH ONCE DAILY BEFORE BREAKFAST 30 tablet 0   lidocaine-prilocaine (EMLA) cream Apply topically daily as needed. 30 g 3   RELION PEN NEEDLES 32G X 4 MM MISC USE 1 PEN PER DAY 50 each 5   simvastatin (ZOCOR) 10 MG tablet Take 10 mg by mouth at bedtime.  No current facility-administered medications on file prior to visit.    Allergies  Allergen Reactions   Sulfa Antibiotics Nausea Only   Sulfamethoxazole Nausea Only    Family History  Problem Relation Age of Onset   Diabetes Mother    Hypertension Mother    Diabetes Sister    Hypertension Sister    Diabetes Maternal Uncle    Colon cancer Paternal Aunt    Diabetes Paternal Aunt    Stomach cancer Neg Hx    Rectal cancer Neg Hx    Esophageal cancer Neg Hx    Colon polyps Neg Hx     BP (!) 176/92 (BP Location: Left Arm, Patient Position: Sitting, Cuff Size: Normal)    Pulse 96    Ht 5\' 6"  (1.676 m)    Wt 157 lb (71.2 kg)    SpO2 97%    BMI 25.34 kg/m    Review  of Systems     Objective:   Physical Exam  Lab Results  Component Value Date   TSH 1.295 04/22/2021   T4TOTAL 8.3 01/10/2020    Lab Results  Component Value Date   CREATININE 1.31 (H) 05/13/2021   BUN 19 05/13/2021   NA 143 05/13/2021   K 3.3 (L) 05/13/2021   CL 110 05/13/2021   CO2 25 05/13/2021   A1c=6.2%    Assessment & Plan:  Type 2 DM: stable off medication Hyperparathyroidism: recheck today  Patient Instructions  No medication is needed for the blood sugar.   check your blood sugar 3 times a day.  vary the time of day when you check, between before the 3 meals, and at bedtime.  also check if you have symptoms of your blood sugar being too high or too low.  please keep a record of the readings and bring it to your next appointment here (or you can bring the meter itself).  You can write it on any piece of paper.  please call us sooner if your blood sugar goes below 70, or if you have a lot of readings over 200.    Blood tests are requested for you today.  We'll let you know about the results.   Based on the results, you may need to add another pill for the calcium and parathyroid Please come back for a follow-up appointment in 3 months.

## 2021-05-27 NOTE — Patient Instructions (Addendum)
No medication is needed for the blood sugar.   check your blood sugar 3 times a day.  vary the time of day when you check, between before the 3 meals, and at bedtime.  also check if you have symptoms of your blood sugar being too high or too low.  please keep a record of the readings and bring it to your next appointment here (or you can bring the meter itself).  You can write it on any piece of paper.  please call us sooner if your blood sugar goes below 70, or if you have a lot of readings over 200.    Blood tests are requested for you today.  We'll let you know about the results.   Based on the results, you may need to add another pill for the calcium and parathyroid Please come back for a follow-up appointment in 3 months.

## 2021-05-27 NOTE — Telephone Encounter (Signed)
She called and left a message, She saw Dr. Loanne Drilling today and he did a vitamin D lab. She no longer needs to vitamin D on 2/17. Vitamin D canceled.  Just FYI

## 2021-05-28 LAB — PTH, INTACT AND CALCIUM
Calcium: 9.4 mg/dL (ref 8.6–10.4)
PTH: 410 pg/mL — ABNORMAL HIGH (ref 16–77)

## 2021-05-28 MED ORDER — ERGOCALCIFEROL 1.25 MG (50000 UT) PO CAPS: 50000.0000 [IU] | ORAL_CAPSULE | ORAL | 1 refills | Status: DC

## 2021-05-31 ENCOUNTER — Other Ambulatory Visit: Payer: Self-pay

## 2021-05-31 DIAGNOSIS — N1831 Chronic kidney disease, stage 3a: Secondary | ICD-10-CM

## 2021-05-31 MED ORDER — ONETOUCH VERIO VI STRP: ORAL_STRIP | 3 refills | Status: DC

## 2021-06-04 ENCOUNTER — Encounter: Payer: Self-pay | Admitting: Hematology and Oncology

## 2021-06-04 ENCOUNTER — Inpatient Hospital Stay: Payer: Medicare Other | Attending: Gynecology | Admitting: Hematology and Oncology

## 2021-06-04 ENCOUNTER — Inpatient Hospital Stay: Payer: Medicare Other

## 2021-06-04 ENCOUNTER — Other Ambulatory Visit: Payer: Self-pay

## 2021-06-04 VITALS — BP 167/101 | HR 85 | Temp 97.6°F | Resp 18 | Ht 66.0 in | Wt 159.0 lb

## 2021-06-04 DIAGNOSIS — Z5112 Encounter for antineoplastic immunotherapy: Secondary | ICD-10-CM | POA: Diagnosis present

## 2021-06-04 DIAGNOSIS — C773 Secondary and unspecified malignant neoplasm of axilla and upper limb lymph nodes: Secondary | ICD-10-CM

## 2021-06-04 DIAGNOSIS — C541 Malignant neoplasm of endometrium: Secondary | ICD-10-CM

## 2021-06-04 DIAGNOSIS — Z7189 Other specified counseling: Secondary | ICD-10-CM

## 2021-06-04 DIAGNOSIS — I10 Essential (primary) hypertension: Secondary | ICD-10-CM | POA: Diagnosis not present

## 2021-06-04 DIAGNOSIS — K648 Other hemorrhoids: Secondary | ICD-10-CM | POA: Insufficient documentation

## 2021-06-04 DIAGNOSIS — K649 Unspecified hemorrhoids: Secondary | ICD-10-CM | POA: Diagnosis not present

## 2021-06-04 DIAGNOSIS — N183 Chronic kidney disease, stage 3 unspecified: Secondary | ICD-10-CM | POA: Diagnosis not present

## 2021-06-04 DIAGNOSIS — I129 Hypertensive chronic kidney disease with stage 1 through stage 4 chronic kidney disease, or unspecified chronic kidney disease: Secondary | ICD-10-CM | POA: Diagnosis not present

## 2021-06-04 LAB — CBC WITH DIFFERENTIAL/PLATELET
Abs Immature Granulocytes: 0.01 10*3/uL (ref 0.00–0.07)
Basophils Absolute: 0 10*3/uL (ref 0.0–0.1)
Basophils Relative: 1 %
Eosinophils Absolute: 0 10*3/uL (ref 0.0–0.5)
Eosinophils Relative: 1 %
HCT: 41.7 % (ref 36.0–46.0)
Hemoglobin: 13.4 g/dL (ref 12.0–15.0)
Immature Granulocytes: 0 %
Lymphocytes Relative: 33 %
Lymphs Abs: 1.3 10*3/uL (ref 0.7–4.0)
MCH: 27.9 pg (ref 26.0–34.0)
MCHC: 32.1 g/dL (ref 30.0–36.0)
MCV: 86.9 fL (ref 80.0–100.0)
Monocytes Absolute: 0.2 10*3/uL (ref 0.1–1.0)
Monocytes Relative: 6 %
Neutro Abs: 2.4 10*3/uL (ref 1.7–7.7)
Neutrophils Relative %: 59 %
Platelets: 171 10*3/uL (ref 150–400)
RBC: 4.8 MIL/uL (ref 3.87–5.11)
RDW: 15.2 % (ref 11.5–15.5)
WBC: 4 10*3/uL (ref 4.0–10.5)
nRBC: 0 % (ref 0.0–0.2)

## 2021-06-04 LAB — COMPREHENSIVE METABOLIC PANEL
ALT: 11 U/L (ref 0–44)
AST: 13 U/L — ABNORMAL LOW (ref 15–41)
Albumin: 3.6 g/dL (ref 3.5–5.0)
Alkaline Phosphatase: 62 U/L (ref 38–126)
Anion gap: 7 (ref 5–15)
BUN: 20 mg/dL (ref 8–23)
CO2: 22 mmol/L (ref 22–32)
Calcium: 8.7 mg/dL — ABNORMAL LOW (ref 8.9–10.3)
Chloride: 114 mmol/L — ABNORMAL HIGH (ref 98–111)
Creatinine, Ser: 1.32 mg/dL — ABNORMAL HIGH (ref 0.44–1.00)
GFR, Estimated: 43 mL/min — ABNORMAL LOW (ref >60)
Glucose, Bld: 95 mg/dL (ref 70–99)
Potassium: 3.6 mmol/L (ref 3.5–5.1)
Sodium: 143 mmol/L (ref 135–145)
Total Bilirubin: 0.5 mg/dL (ref 0.3–1.2)
Total Protein: 6 g/dL — ABNORMAL LOW (ref 6.5–8.1)

## 2021-06-04 MED ORDER — SODIUM CHLORIDE 0.9% FLUSH
10.0000 mL | Freq: Once | INTRAVENOUS | Status: AC
  Administered 2021-06-04: 10 mL

## 2021-06-04 MED ORDER — HEPARIN SOD (PORK) LOCK FLUSH 100 UNIT/ML IV SOLN
500.0000 [IU] | Freq: Once | INTRAVENOUS | Status: AC | PRN
  Administered 2021-06-04: 500 [IU]

## 2021-06-04 MED ORDER — ATENOLOL 25 MG PO TABS: 25.0000 mg | ORAL_TABLET | Freq: Every day | ORAL | 1 refills | Status: DC

## 2021-06-04 MED ORDER — SODIUM CHLORIDE 0.9% FLUSH
10.0000 mL | INTRAVENOUS | Status: DC | PRN
  Administered 2021-06-04: 10 mL

## 2021-06-04 MED ORDER — SODIUM CHLORIDE 0.9 % IV SOLN: Freq: Once | INTRAVENOUS | Status: AC

## 2021-06-04 MED ORDER — SODIUM CHLORIDE 0.9 % IV SOLN
200.0000 mg | Freq: Once | INTRAVENOUS | Status: AC
  Administered 2021-06-04: 200 mg via INTRAVENOUS
  Filled 2021-06-04: qty 8

## 2021-06-04 NOTE — Assessment & Plan Note (Signed)
She is noted to be hypocalcemic Her recent vitamin D level was low and she is not taking more vitamin D I would defer to her endocrinologist for management

## 2021-06-04 NOTE — Progress Notes (Signed)
[place] OFFICE PROGRESS NOTE  Patient Care Team: Jonathon Jordan, MD as PCP - General (Family Medicine) Awanda Mink Craige Cotta, RN as Registered Nurse (Oncology)  ASSESSMENT & PLAN:  Endometrial cancer [facility]) Her last CT imaging showed stability of the lymph node in the axilla, asymptomatic She tolerated treatment well except for acquired hypothyroidism and intermittent uncontrolled hypertension I plan to space out imaging study to 6 months in the future, due next in June 2023  Chronic kidney disease (CKD), stage III (moderate) (HCC) Recent renal function fluctuates up and down We discussed the importance of aggressive risk factor modification and to drink plenty of oral fluids  Hypocalcemia She is noted to be hypocalcemic Her recent vitamin D level was low and she is not taking more vitamin D I would defer to her endocrinologist for management  Essential hypertension She has poorly controlled hypertension I worried about exacerbation of blood pressure control while she is on Lenvima She takes all her medications in the morning Her blood pressure is high in the morning I suspect the Exforge effect is not sustaining good blood pressure control for 24 hours I recommend the patient to take low-dose beta-blocker in the evening to see if we can get her blood pressure better controlled The patient had multiple cardiovascular risk factors She is in agreement to try  Hemorrhoids She had recent hemorrhoidal bleeding I have reviewed colonoscopy report dated 12/25/2017 which showed multiple polyps as well as nonbleeding internal hemorrhoids If her bleeding got worse, we will refer her back to Dr. Silverio Decamp for further evaluation She is in agreement with the plan of care  Orders Placed This Encounter  Procedures   Total Protein, Urine dipstick    Standing Status:   Standing    Number of Occurrences:   22    Standing Expiration Date:   06/04/2022    All questions were answered.  The patient knows to call the clinic with any problems, questions or concerns. The total time spent in the appointment was 30 minutes encounter with patients including review of chart and various tests results, discussions about plan of care and coordination of care plan   [name], MD 06/04/2021 9:26 AM  INTERVAL HISTORY: Please see below for problem oriented charting. she returns for treatment follow-up on Lenvima and pembrolizumab for recurrent uterine cancer She noticed some recent hemorrhoidal bleeding The patient reported poorly controlled blood pressure in the morning before she takes her medications Her systolic blood pressure is consistently over 150 Otherwise, she has no other side effects from treatment so far  REVIEW OF SYSTEMS:   Constitutional: Denies fevers, chills or abnormal weight loss Eyes: Denies blurriness of vision Ears, nose, mouth, throat, and face: Denies mucositis or sore throat Respiratory: Denies cough, dyspnea or wheezes Cardiovascular: Denies palpitation, chest discomfort or lower extremity swelling Gastrointestinal:  Denies nausea, heartburn or change in bowel habits Skin: Denies abnormal skin rashes Lymphatics: Denies new lymphadenopathy or easy bruising Neurological:Denies numbness, tingling or new weaknesses Behavioral/Psych: Mood is stable, no new changes  All other systems were reviewed with the patient and are negative.  I have reviewed the past medical history, past surgical history, social history and family history with the patient and they are unchanged from previous note.  ALLERGIES:  is allergic to sulfa antibiotics and sulfamethoxazole.  MEDICATIONS:  Current Outpatient Medications  Medication Sig Dispense Refill   atenolol (TENORMIN) 25 MG tablet Take 1 tablet (25 mg total) by mouth daily. 30 tablet 1  amLODipine-valsartan (EXFORGE) 5-160 MG tablet Take 1 tablet by mouth daily.     Calcium Carb-Cholecalciferol 600-800 MG-UNIT TABS  Take 1 tablet by mouth daily.     ergocalciferol (VITAMIN D2) 1.25 MG (50000 UT) capsule Take 1 capsule (50,000 Units total) by mouth 2 (two) times a week. 25 capsule 1   glucose blood (ONETOUCH VERIO) test strip Use as instructed to check blood sugars 3 times a day. 100 each 3   lenvatinib 10 mg daily dose (LENVIMA, 10 MG DAILY DOSE,) capsule Take 1 capsule (10 mg total) by mouth daily. 30 capsule 11   levothyroxine (SYNTHROID) 175 MCG tablet TAKE 1 TABLET BY MOUTH ONCE DAILY BEFORE BREAKFAST 30 tablet 0   lidocaine-prilocaine (EMLA) cream Apply topically daily as needed. 30 g 3   OneTouch Delica Lancets 87F MISC 1 each by Other route 2 (two) times daily. Use to monitor glucose levels BID; E11.65 100 each 2   RELION PEN NEEDLES 32G X 4 MM MISC USE 1 PEN PER DAY 50 each 5   simvastatin (ZOCOR) 10 MG tablet Take 10 mg by mouth at bedtime.      No current facility-administered medications for this visit.   Facility-Administered Medications Ordered in Other Visits  Medication Dose Route Frequency Provider Last Rate Last Admin   0.9 %  sodium chloride infusion   Intravenous Once Alvy Bimler, Neosha Switalski, MD       heparin lock flush 100 unit/mL  500 Units Intracatheter Once PRN Alvy Bimler, Demita Tobia, MD       pembrolizumab (KEYTRUDA) 200 mg in sodium chloride 0.9 % 50 mL chemo infusion  200 mg Intravenous Once Alvy Bimler, Avaiyah Strubel, MD       sodium chloride flush (NS) 0.9 % injection 10 mL  10 mL Intracatheter PRN Alvy Bimler, Fayne Mcguffee, MD        SUMMARY OF ONCOLOGIC HISTORY: Oncology History Overview Note  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 Leo Grosser who obtained an endometrial biopsy showing poorly differentiated endometrial carcinoma and negative endocervical curettage   12/12/2017 Imaging   MAMMOGRAM  FINDINGS: In the right axilla, a possible mass warrants further evaluation. In the left breast, no findings suspicious for malignancy.   Images were processed with CAD.   IMPRESSION: Further evaluation is suggested for possible mass in the right axilla.     12/24/2017 Imaging   Ct scan chest, abdomen and pelvis 1. Marked thickening of the endometrium (42 mm) compatible with known primary endometrial malignancy. No evidence of extrauterine invasion. 2. No pelvic or retroperitoneal adenopathy. 3. Right middle lobe 4 mm solid pulmonary nodule, for which follow-up chest CT is advised in 3-6 months. 4. Several findings that are equivocal for distant metastatic disease. Vaguely nodular heterogeneous hyperenhancement in the peripheral right liver lobe, which could represent benign transient perfusional phenomena, with underlying liver lesions not entirely excluded. Mildly sclerotic T12 vertebral lesion. Mildly enlarged right axillary lymph node. The best single test to further evaluate these findings would be a PET-CT. Alternative tests include bone scan or thoracic MRI without and with IV contrast for the T12 osseous lesion, MRI abdomen without and with IV contrast for the liver findings, and diagnostic mammographic evaluation for the right axillary node. 5.  Aortic Atherosclerosis (ICD10-I70.0).     01/09/2018 PET scan   1. Moderate  hypermetabolism corresponding to enlarging axillary node since 12/22/2017. Highly suspicious for an atypical distribution of metastatic disease. 2. No hypermetabolism to suggest hepatic or T12 osseous metastasis. 3. Hypermetabolic endometrial primary.   01/23/2018 Initial Diagnosis   Endometrial cancer ([place])   01/23/2018 Pathology Results   1. Lymph node, sentinel, biopsy, left external iliac - NO CARCINOMA IDENTIFIED IN ONE LYMPH NODE (0/1) - SEE COMMENT 2. Lymph nodes, regional resection, right para aortic - NO CARCINOMA IDENTIFIED IN FOUR LYMPH NODES (0/4) - SEE  COMMENT 3. Lymph nodes, regional resection, right pelvic - NO CARCINOMA IDENTIFIED IN EIGHT LYMPH NODES (0/8) - SEE COMMENT 4. Cul-de-sac biopsy - METASTATIC CARCINOMA 5. Uterus +/- tubes/ovaries, neoplastic, cervix, bilateral fallopian tubes and ovaries UTERUS: - MIXED SEROUS AND ENDOMETRIOID CARCINOMA - SEROSAL IMPLANTS PRESENT - LYMPHOVASCULAR SPACE INVASION PRESENT - LEIOMYOMATA (1.5 CM; LARGEST) - SEE ONCOLOGY TABLE AND COMMENT BELOW CERVIX: - BENIGN NABOTHIAN CYSTS - NO CARCINOMA IDENTIFIED BILATERAL OVARIES: - METASTATIC CARCINOMA PRESENT ON OVARIAN SURFACE BILATERAL FALLOPIAN TUBES: - INTRALUMINAL CARCINOMA Microscopic Comment 1. -3. Cytokeratin AE1/3 was performed on the sentinel lymph nodes to exclude micrometastasis. There is no evidence of metastatic carcinoma by immunohistochemistry. 5. 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%) Histologic Grade: N/A Myometrial invasion: Estimated less than 50% myometrial invasion (0.3 cm of myometrium involved; 1.4 cm measured thickness) Uterine Serosa Involvement: Present Cervical stromal involvement: Not identified Extent of involvement of other organs: - Fallopian tube (left within the lumen) - Ovary, left (surface involvement) - Cul-de-sac Lymphovascular invasion: Present Regional Lymph Nodes: Examined: 1 Sentinel 12 Non-sentinel 13 Total Lymph nodes with metastasis: 0 Isolated tumor cells (< 0.2 mm): 0 Micrometastasis: (> 0.2 mm and < 2.0 mm): 0 Macrometastasis: (> 2.0 mm): 0 Extracapsular extension: N/A Tumor block for ancillary studies: 5E, 5B MMR / MSI testing: Pending will be reported separately Pathologic Stage Classification (pTNM, AJCC 8th edition): pT3a, pN0 FIGO Stage:  IIIA COMMENT: There is tumor present on the surface of the left ovary and within the lumen of the left fallopian tube. The carcinoma appears to be mixed with the largest component being high grade serous carcinoma. Dr. Lyndon Code reviewed the case and agrees with the above diagnosis.   01/23/2018 Surgery   Surgeon: Donaciano Eva     Operation: Robotic-assisted laparoscopic total hysterectomy with bilateral salpingoophorectomy, SLN injection, mapping and biopsy, right pelvic and para-aortic lymphadenectomy   Operative Findings:  : 10-12cm bulky uterus with frank serosal involvement on posterior cul de sac peritoneum and anterior peritoneum which adhesed the bladder to the anterior uterus. Unilateral mapping on left pelvis. No grossly suspicious nodes. Normal omentum and diaphragms.    02/01/2018 Pathology Results   Lymph node, needle/core biopsy, right axilla - METASTATIC CARCINOMA - SEE COMMENT Microscopic Comment The neoplastic cells are positive for cytokeratin 7 and Pax-8 but negative for cytokeratin 5/6, cytokeratin 20, Gata-3, p63, p53 and GCDFP. Overall, the immunoprofile is consistent with metastasis from the patient's known gynecologic carcinoma.   02/01/2018 Procedure   Ultrasound-guided core biopsies of a suspicious right axillary lymph node.   02/21/2018 - 06/13/2018 Chemotherapy   The patient had carboplatin & Taxol x 6   03/26/2018 - 04/30/2018 Radiation Therapy   Radiation treatment dates:   03/26/18, 04/02/18, 04/19/18, 04/23/18 04/30/18   Site/dose: proximal Vagina, 6 Gy in 5 fractions for a total dose  of 30 Gy     04/26/2018 PET scan   1. Interval resection of the hypermetabolic endometrial primary. No discernible hypermetabolic pelvic sidewall or peritoneal metastases. 2. Stable hypermetabolic bilateral axillary lymphadenopathy. The degree of hypermetabolism in these lymph nodes remains highly suspicious for neoplasm. 3. Interval development of low level FDG uptake in 2 stable,  small lymph nodes in the left groin region. This may be reactive, but close attention recommended to exclude metastatic involvement. 4. Stable non hypermetabolic low-density liver lesions and the mixed lucent and sclerotic T12 lesion is stable without hypermetabolism today.   07/09/2018 Imaging   Status post hysterectomy.   Mild axillary lymphadenopathy, improved. Nodal metastases not excluded.   Irregular wall thickening involving the anterior bladder, possibly reflecting radiation cystitis versus tumor.   Additional stable findings as above, including a 5 mm right middle lobe nodule and stable sclerosis involving the T12 vertebral body.   10/10/2018 Imaging   1. Stable exam. No findings within the abdomen or pelvis to suggest metastatic disease. 2. Unchanged appearance of sclerosis involving the T12 vertebra. 3.  Aortic Atherosclerosis (ICD10-I70.0).   10/10/2018 Imaging   Ct abdomen and pelvis 1. Stable exam. No findings within the abdomen or pelvis to suggest metastatic disease. 2. Unchanged appearance of sclerosis involving the T12 vertebra. 3.  Aortic Atherosclerosis (ICD10-I70.0).   01/16/2019 PET scan   1. Enlargement and increased activity in the left axillary, right axillary, and left inguinal adenopathy compatible with progressive malignancy. 2. Stable 4 mm right middle lobe subpleural nodule, not appreciably hypermetabolic. 3. Other imaging findings of potential clinical significance: Right kidney lower pole cyst. Aortic Atherosclerosis (ICD10-I70.0). Prominent stool throughout the colon favors constipation. Mild chronic left maxillary sinusitis.     02/01/2019 -  Chemotherapy   The patient had pembrolizumab and Lenvima for chemotherapy treatment.     04/25/2019 Imaging   Ct imaging 1. Interval response to therapy. Decrease in size of bilateral axillary and left inguinal lymph nodes. No new or progressive findings identified. 2. Stable sclerotic appearance of the T12  vertebra. 3. Aortic atherosclerosis. Lad coronary artery calcification noted.   08/30/2019 Imaging   1. Bulky RIGHT hilar lymph node, slightly increased in size from previous imaging. 2. Multiple foci of hyperenhancement in the liver. 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 Consider MRI liver on follow-up to establish a baseline for number of lesions as these will appear variable on CT follow-up based on phase of contrast acquisition in the future. 3. Stable 5 mm nodule adjacent to the fissure, minor fissure in the RIGHT middle lobe.     11/28/2019 Imaging   1. Stable exam. No new or progressive findings. 2. Stable enlarged right axillary lymph node. 3. Stable tiny bilateral pulmonary nodules. Continued attention on follow-up recommended. 4. Scattered hypodensities in the liver parenchyma correspond to the hypervascular lesions seen on the previous study performed with intravenous contrast material. 5. Right renal cyst. 6. Aortic Atherosclerosis (ICD10-I70.0).     03/16/2020 Imaging   Increased echogenicity of renal parenchyma is noted bilaterally suggesting medical renal disease. No hydronephrosis or renal obstruction is noted   04/02/2020 Imaging   1. Right axillary lymphadenopathy is stable. Tiny bilateral pulmonary nodules are stable. Subcentimeter low-attenuation liver lesions are stable. 2. No new or progressive metastatic disease  in the chest, abdomen or pelvis. 3. Aortic Atherosclerosis (ICD10-I70.0).   09/15/2020 Imaging   1. Stable RIGHT axillary lymph node with enlargement measuring 2 cm short axis. 2. Stable small nodule along the minor fissure in the RIGHT chest. 3. No new or progressive findings. 4. Aortic atherosclerosis.   03/24/2021 Imaging   1. Right axillary  lymphadenopathy is stable compared to prior studies. 2. No other definite signs of metastatic disease elsewhere in the thorax. 3. Hepatic steatosis. Numerous hypervascular areas noted in the liver, incompletely characterized on today's arterial phase examination. These are similar to remote prior examination from 08/30/2019, likely to represent small benign lesions such as flash fill cavernous hemangiomas. These could be definitively evaluated with nonemergent abdominal MRI with and without IV gadolinium if of clinical concern. 4. Aortic atherosclerosis, in addition to left main and left anterior descending coronary artery disease. Assessment for potential risk factor modification, dietary therapy or pharmacologic therapy may be warranted, if clinically indicated.   Aortic Atherosclerosis (ICD10-I70.0).     PHYSICAL EXAMINATION: ECOG PERFORMANCE STATUS: 1 - Symptomatic but completely ambulatory  Vitals:   06/04/21 0837  BP: (!) 167/101  Pulse: 85  Resp: 18  Temp: 97.6 F (36.4 C)  SpO2: 100%   Filed Weights   06/04/21 0837  Weight: 159 lb (72.1 kg)    GENERAL:alert, no distress and comfortable NEURO: alert & oriented x 3 with fluent speech, no focal motor/sensory deficits  LABORATORY DATA:  I have reviewed the data as listed    Component Value Date/Time   NA 143 06/04/2021 0819   K 3.6 06/04/2021 0819   CL 114 (H) 06/04/2021 0819   CO2 22 06/04/2021 0819   GLUCOSE 95 06/04/2021 0819   BUN 20 06/04/2021 0819   CREATININE 1.32 (H) 06/04/2021 0819   CREATININE 1.75 (H) 01/10/2020 1200   CALCIUM 8.7 (L) 06/04/2021 0819   PROT 6.0 (L) 06/04/2021 0819   ALBUMIN 3.6 06/04/2021 0819   AST 13 (L) 06/04/2021 0819   AST 13 (L) 01/10/2020 1200   ALT 11 06/04/2021 0819   ALT 11 01/10/2020 1200   ALKPHOS 62 06/04/2021 0819   BILITOT 0.5 06/04/2021 0819   BILITOT 0.4 01/10/2020 1200   GFRNONAA 43 (L) 06/04/2021 0819   GFRNONAA 29 (L) 01/10/2020 1200   GFRAA 34 (L) 01/10/2020  1200    No results found for: SPEP, UPEP  Lab Results  Component Value Date   WBC 4.0 06/04/2021   NEUTROABS 2.4 06/04/2021   HGB 13.4 06/04/2021   HCT 41.7 06/04/2021   MCV 86.9 06/04/2021   PLT 171 06/04/2021      Chemistry      Component Value Date/Time   NA 143 06/04/2021 0819   K 3.6 06/04/2021 0819   CL 114 (H) 06/04/2021 0819   CO2 22 06/04/2021 0819   BUN 20 06/04/2021 0819   CREATININE 1.32 (H) 06/04/2021 0819   CREATININE 1.75 (H) 01/10/2020 1200      Component Value Date/Time   CALCIUM 8.7 (L) 06/04/2021 0819   ALKPHOS 62 06/04/2021 0819   AST 13 (L) 06/04/2021 0819   AST 13 (L) 01/10/2020 1200   ALT 11 06/04/2021 0819   ALT 11 01/10/2020 1200   BILITOT 0.5 06/04/2021 0819   BILITOT 0.4 01/10/2020 1200

## 2021-06-04 NOTE — Assessment & Plan Note (Signed)
Recent renal function fluctuates up and down We discussed the importance of aggressive risk factor modification and to drink plenty of oral fluids 

## 2021-06-04 NOTE — Assessment & Plan Note (Signed)
Her last CT imaging showed stability of the lymph node in the axilla, asymptomatic She tolerated treatment well except for acquired hypothyroidism and intermittent uncontrolled hypertension I plan to space out imaging study to 6 months in the future, due next in June 2023

## 2021-06-04 NOTE — Assessment & Plan Note (Signed)
She had recent hemorrhoidal bleeding I have reviewed colonoscopy report dated 12/25/2017 which showed multiple polyps as well as nonbleeding internal hemorrhoids If her bleeding got worse, we will refer her back to Dr. Silverio Decamp for further evaluation She is in agreement with the plan of care

## 2021-06-04 NOTE — Assessment & Plan Note (Signed)
She has poorly controlled hypertension I worried about exacerbation of blood pressure control while she is on Lenvima She takes all her medications in the morning Her blood pressure is high in the morning I suspect the Exforge effect is not sustaining good blood pressure control for 24 hours I recommend the patient to take low-dose beta-blocker in the evening to see if we can get her blood pressure better controlled The patient had multiple cardiovascular risk factors She is in agreement to try

## 2021-06-04 NOTE — Patient Instructions (Signed)
[facility] MEDICAL ONCOLOGY  Discharge Instructions: ?Thank you for choosing [facility] to provide your oncology and hematology care.  ? ?If you have a lab appointment with the Cancer Center, please go directly to the Cancer Center and check in at the registration area. ?  ?Wear comfortable clothing and clothing appropriate for easy access to any Portacath or PICC line.  ? ?We strive to give you quality time with your provider. You may need to reschedule your appointment if you arrive late (15 or more minutes).  Arriving late affects you and other patients whose appointments are after yours.  Also, if you miss three or more appointments without notifying the office, you may be dismissed from the clinic at the provider?s discretion.    ?  ?For prescription refill requests, have your pharmacy contact our office and allow 72 hours for refills to be completed.   ? ?Today you received the following chemotherapy and/or immunotherapy agents: Keytruda ?  ?To help prevent nausea and vomiting after your treatment, we encourage you to take your nausea medication as directed. ? ?BELOW ARE SYMPTOMS THAT SHOULD BE REPORTED IMMEDIATELY: ?*FEVER GREATER THAN 100.4 F (38 ?C) OR HIGHER ?*CHILLS OR SWEATING ?*NAUSEA AND VOMITING THAT IS NOT CONTROLLED WITH YOUR NAUSEA MEDICATION ?*UNUSUAL SHORTNESS OF BREATH ?*UNUSUAL BRUISING OR BLEEDING ?*URINARY PROBLEMS (pain or burning when urinating, or frequent urination) ?*BOWEL PROBLEMS (unusual diarrhea, constipation, pain near the anus) ?TENDERNESS IN MOUTH AND THROAT WITH OR WITHOUT PRESENCE OF ULCERS (sore throat, sores in mouth, or a toothache) ?UNUSUAL RASH, SWELLING OR PAIN  ?UNUSUAL VAGINAL DISCHARGE OR ITCHING  ? ?Items with * indicate a potential emergency and should be followed up as soon as possible or go to the Emergency Department if any problems should occur. ? ?Please show the CHEMOTHERAPY ALERT CARD or IMMUNOTHERAPY ALERT CARD at check-in to the  Emergency Department and triage nurse. ? ?Should you have questions after your visit or need to cancel or reschedule your appointment, please contact [facility]  Dept: [phone]  and follow the prompts.  Office hours are 8:00 a.m. to 4:30 p.m. Monday - Friday. Please note that voicemails left after 4:00 p.m. may not be returned until the following business day.  We are closed weekends and major holidays. You have access to a nurse at all times for urgent questions. Please call the main number to the clinic Dept: [phone] and follow the prompts. ? ? ?For any non-urgent questions, you may also contact your provider using MyChart. We now offer e-Visits for anyone 18 and older to request care online for non-urgent symptoms. For details visit [email or web address]. ?  ?Also download the MyChart app! Go to the app store, search "MyChart", open the app, select [place], and log in with your MyChart username and password. ? ?Due to Covid, a mask is required upon entering the hospital/clinic. If you do not have a mask, one will be given to you upon arrival. For doctor visits, patients may have 1 support person aged 18 or older with them. For treatment visits, patients cannot have anyone with them due to current Covid guidelines and our immunocompromised population.  ? ?

## 2021-06-09 DIAGNOSIS — Z20822 Contact with and (suspected) exposure to covid-19: Secondary | ICD-10-CM | POA: Diagnosis not present

## 2021-06-16 DIAGNOSIS — Z20822 Contact with and (suspected) exposure to covid-19: Secondary | ICD-10-CM | POA: Diagnosis not present

## 2021-06-19 ENCOUNTER — Other Ambulatory Visit: Payer: Self-pay | Admitting: Hematology and Oncology

## 2021-06-21 ENCOUNTER — Ambulatory Visit
Admission: RE | Admit: 2021-06-21 | Discharge: 2021-06-21 | Disposition: A | Discharge status: Home / Self Care | Payer: Medicare Other | Source: Ambulatory Visit | Attending: Family Medicine | Admitting: Family Medicine

## 2021-06-21 ENCOUNTER — Encounter: Payer: Self-pay | Admitting: Hematology and Oncology

## 2021-06-21 DIAGNOSIS — Z780 Asymptomatic menopausal state: Secondary | ICD-10-CM | POA: Diagnosis not present

## 2021-06-21 DIAGNOSIS — M85851 Other specified disorders of bone density and structure, right thigh: Secondary | ICD-10-CM | POA: Diagnosis not present

## 2021-06-21 DIAGNOSIS — E2839 Other primary ovarian failure: Secondary | ICD-10-CM

## 2021-06-23 DIAGNOSIS — I10 Essential (primary) hypertension: Secondary | ICD-10-CM | POA: Diagnosis not present

## 2021-06-23 DIAGNOSIS — E785 Hyperlipidemia, unspecified: Secondary | ICD-10-CM | POA: Diagnosis not present

## 2021-06-23 DIAGNOSIS — E1122 Type 2 diabetes mellitus with diabetic chronic kidney disease: Secondary | ICD-10-CM | POA: Diagnosis not present

## 2021-06-23 DIAGNOSIS — N1831 Chronic kidney disease, stage 3a: Secondary | ICD-10-CM | POA: Diagnosis not present

## 2021-06-25 ENCOUNTER — Other Ambulatory Visit: Payer: Self-pay

## 2021-06-25 ENCOUNTER — Inpatient Hospital Stay: Payer: Medicare Other | Attending: Gynecology

## 2021-06-25 ENCOUNTER — Other Ambulatory Visit: Payer: Self-pay | Admitting: Hematology and Oncology

## 2021-06-25 ENCOUNTER — Inpatient Hospital Stay: Payer: Medicare Other

## 2021-06-25 VITALS — BP 159/86 | HR 72 | Temp 97.8°F | Resp 18 | Wt 157.8 lb

## 2021-06-25 DIAGNOSIS — R059 Cough, unspecified: Secondary | ICD-10-CM | POA: Diagnosis not present

## 2021-06-25 DIAGNOSIS — C541 Malignant neoplasm of endometrium: Secondary | ICD-10-CM

## 2021-06-25 DIAGNOSIS — C773 Secondary and unspecified malignant neoplasm of axilla and upper limb lymph nodes: Secondary | ICD-10-CM

## 2021-06-25 DIAGNOSIS — Z79899 Other long term (current) drug therapy: Secondary | ICD-10-CM | POA: Diagnosis not present

## 2021-06-25 DIAGNOSIS — I10 Essential (primary) hypertension: Secondary | ICD-10-CM

## 2021-06-25 DIAGNOSIS — R051 Acute cough: Secondary | ICD-10-CM | POA: Diagnosis not present

## 2021-06-25 DIAGNOSIS — Z20822 Contact with and (suspected) exposure to covid-19: Secondary | ICD-10-CM | POA: Diagnosis not present

## 2021-06-25 DIAGNOSIS — I129 Hypertensive chronic kidney disease with stage 1 through stage 4 chronic kidney disease, or unspecified chronic kidney disease: Secondary | ICD-10-CM | POA: Insufficient documentation

## 2021-06-25 DIAGNOSIS — N183 Chronic kidney disease, stage 3 unspecified: Secondary | ICD-10-CM | POA: Diagnosis not present

## 2021-06-25 DIAGNOSIS — Z5112 Encounter for antineoplastic immunotherapy: Secondary | ICD-10-CM | POA: Diagnosis not present

## 2021-06-25 DIAGNOSIS — Z7189 Other specified counseling: Secondary | ICD-10-CM

## 2021-06-25 DIAGNOSIS — E039 Hypothyroidism, unspecified: Secondary | ICD-10-CM

## 2021-06-25 LAB — COMPREHENSIVE METABOLIC PANEL
ALT: 11 U/L (ref 0–44)
AST: 12 U/L — ABNORMAL LOW (ref 15–41)
Albumin: 3.5 g/dL (ref 3.5–5.0)
Alkaline Phosphatase: 65 U/L (ref 38–126)
Anion gap: 6 (ref 5–15)
BUN: 19 mg/dL (ref 8–23)
CO2: 20 mmol/L — ABNORMAL LOW (ref 22–32)
Calcium: 8.5 mg/dL — ABNORMAL LOW (ref 8.9–10.3)
Chloride: 116 mmol/L — ABNORMAL HIGH (ref 98–111)
Creatinine, Ser: 1.22 mg/dL — ABNORMAL HIGH (ref 0.44–1.00)
GFR, Estimated: 48 mL/min — ABNORMAL LOW (ref >60)
Glucose, Bld: 138 mg/dL — ABNORMAL HIGH (ref 70–99)
Potassium: 3.6 mmol/L (ref 3.5–5.1)
Sodium: 142 mmol/L (ref 135–145)
Total Bilirubin: 0.5 mg/dL (ref 0.3–1.2)
Total Protein: 5.6 g/dL — ABNORMAL LOW (ref 6.5–8.1)

## 2021-06-25 LAB — CBC WITH DIFFERENTIAL/PLATELET
Abs Immature Granulocytes: 0.01 10*3/uL (ref 0.00–0.07)
Basophils Absolute: 0 10*3/uL (ref 0.0–0.1)
Basophils Relative: 0 %
Eosinophils Absolute: 0 10*3/uL (ref 0.0–0.5)
Eosinophils Relative: 0 %
HCT: 39.8 % (ref 36.0–46.0)
Hemoglobin: 13 g/dL (ref 12.0–15.0)
Immature Granulocytes: 0 %
Lymphocytes Relative: 28 %
Lymphs Abs: 1.3 10*3/uL (ref 0.7–4.0)
MCH: 28.2 pg (ref 26.0–34.0)
MCHC: 32.7 g/dL (ref 30.0–36.0)
MCV: 86.3 fL (ref 80.0–100.0)
Monocytes Absolute: 0.2 10*3/uL (ref 0.1–1.0)
Monocytes Relative: 5 %
Neutro Abs: 3 10*3/uL (ref 1.7–7.7)
Neutrophils Relative %: 67 %
Platelets: 165 10*3/uL (ref 150–400)
RBC: 4.61 MIL/uL (ref 3.87–5.11)
RDW: 15.5 % (ref 11.5–15.5)
WBC: 4.5 10*3/uL (ref 4.0–10.5)
nRBC: 0 % (ref 0.0–0.2)

## 2021-06-25 LAB — TOTAL PROTEIN, URINE DIPSTICK: Protein, ur: 30 mg/dL — AB

## 2021-06-25 LAB — TSH: TSH: 10.709 u[IU]/mL — ABNORMAL HIGH (ref 0.308–3.960)

## 2021-06-25 MED ORDER — SODIUM CHLORIDE 0.9 % IV SOLN: Freq: Once | INTRAVENOUS | Status: AC

## 2021-06-25 MED ORDER — SODIUM CHLORIDE 0.9% FLUSH
10.0000 mL | INTRAVENOUS | Status: DC | PRN
  Administered 2021-06-25: 10 mL

## 2021-06-25 MED ORDER — LEVOTHYROXINE SODIUM 200 MCG PO TABS: 200.0000 ug | ORAL_TABLET | Freq: Every day | ORAL | 1 refills | Status: DC

## 2021-06-25 MED ORDER — HEPARIN SOD (PORK) LOCK FLUSH 100 UNIT/ML IV SOLN
500.0000 [IU] | Freq: Once | INTRAVENOUS | Status: AC | PRN
  Administered 2021-06-25: 500 [IU]

## 2021-06-25 MED ORDER — SODIUM CHLORIDE 0.9% FLUSH
10.0000 mL | Freq: Once | INTRAVENOUS | Status: AC
  Administered 2021-06-25: 10 mL

## 2021-06-25 MED ORDER — SODIUM CHLORIDE 0.9 % IV SOLN
200.0000 mg | Freq: Once | INTRAVENOUS | Status: AC
  Administered 2021-06-25: 200 mg via INTRAVENOUS
  Filled 2021-06-25: qty 8

## 2021-06-25 NOTE — Patient Instructions (Signed)
[facility]  Discharge Instructions: ?Thank you for choosing [facility] to provide your oncology and hematology care.  ? ?If you have a lab appointment with the Cancer Center, please go directly to the Cancer Center and check in at the registration area. ?  ?Wear comfortable clothing and clothing appropriate for easy access to any Portacath or PICC line.  ? ?We strive to give you quality time with your provider. You may need to reschedule your appointment if you arrive late (15 or more minutes).  Arriving late affects you and other patients whose appointments are after yours.  Also, if you miss three or more appointments without notifying the office, you may be dismissed from the clinic at the provider?s discretion.    ?  ?For prescription refill requests, have your pharmacy contact our office and allow 72 hours for refills to be completed.   ? ?Today you received the following chemotherapy and/or immunotherapy agents: Keytruda ?  ?To help prevent nausea and vomiting after your treatment, we encourage you to take your nausea medication as directed. ? ?BELOW ARE SYMPTOMS THAT SHOULD BE REPORTED IMMEDIATELY: ?*FEVER GREATER THAN 100.4 F (38 ?C) OR HIGHER ?*CHILLS OR SWEATING ?*NAUSEA AND VOMITING THAT IS NOT CONTROLLED WITH YOUR NAUSEA MEDICATION ?*UNUSUAL SHORTNESS OF BREATH ?*UNUSUAL BRUISING OR BLEEDING ?*URINARY PROBLEMS (pain or burning when urinating, or frequent urination) ?*BOWEL PROBLEMS (unusual diarrhea, constipation, pain near the anus) ?TENDERNESS IN MOUTH AND THROAT WITH OR WITHOUT PRESENCE OF ULCERS (sore throat, sores in mouth, or a toothache) ?UNUSUAL RASH, SWELLING OR PAIN  ?UNUSUAL VAGINAL DISCHARGE OR ITCHING  ? ?Items with * indicate a potential emergency and should be followed up as soon as possible or go to the Emergency Department if any problems should occur. ? ?Please show the CHEMOTHERAPY ALERT CARD or IMMUNOTHERAPY ALERT CARD at check-in to the  Emergency Department and triage nurse. ? ?Should you have questions after your visit or need to cancel or reschedule your appointment, please contact [facility]  Dept: [phone]  and follow the prompts.  Office hours are 8:00 a.m. to 4:30 p.m. Monday - Friday. Please note that voicemails left after 4:00 p.m. may not be returned until the following business day.  We are closed weekends and major holidays. You have access to a nurse at all times for urgent questions. Please call the main number to the clinic Dept: [phone] and follow the prompts. ? ? ?For any non-urgent questions, you may also contact your provider using MyChart. We now offer e-Visits for anyone 18 and older to request care online for non-urgent symptoms. For details visit [email or web address]. ?  ?Also download the MyChart app! Go to the app store, search "MyChart", open the app, select [facility], and log in with your MyChart username and password. ? ?Due to Covid, a mask is required upon entering the hospital/clinic. If you do not have a mask, one will be given to you upon arrival. For doctor visits, patients may have 1 support person aged 18 or older with them. For treatment visits, patients cannot have anyone with them due to current Covid guidelines and our immunocompromised population.  ? ?

## 2021-06-27 DIAGNOSIS — Z20822 Contact with and (suspected) exposure to covid-19: Secondary | ICD-10-CM | POA: Diagnosis not present

## 2021-07-15 ENCOUNTER — Encounter: Payer: Self-pay | Admitting: Hematology and Oncology

## 2021-07-15 ENCOUNTER — Inpatient Hospital Stay: Payer: Medicare Other

## 2021-07-15 ENCOUNTER — Other Ambulatory Visit: Payer: Self-pay | Admitting: Hematology and Oncology

## 2021-07-15 ENCOUNTER — Inpatient Hospital Stay (HOSPITAL_BASED_OUTPATIENT_CLINIC_OR_DEPARTMENT_OTHER): Payer: Medicare Other | Admitting: Hematology and Oncology

## 2021-07-15 ENCOUNTER — Other Ambulatory Visit: Payer: Self-pay

## 2021-07-15 DIAGNOSIS — C541 Malignant neoplasm of endometrium: Secondary | ICD-10-CM

## 2021-07-15 DIAGNOSIS — I129 Hypertensive chronic kidney disease with stage 1 through stage 4 chronic kidney disease, or unspecified chronic kidney disease: Secondary | ICD-10-CM | POA: Diagnosis not present

## 2021-07-15 DIAGNOSIS — I10 Essential (primary) hypertension: Secondary | ICD-10-CM

## 2021-07-15 DIAGNOSIS — Z79899 Other long term (current) drug therapy: Secondary | ICD-10-CM | POA: Diagnosis not present

## 2021-07-15 DIAGNOSIS — N183 Chronic kidney disease, stage 3 unspecified: Secondary | ICD-10-CM | POA: Diagnosis not present

## 2021-07-15 DIAGNOSIS — Z5112 Encounter for antineoplastic immunotherapy: Secondary | ICD-10-CM | POA: Diagnosis not present

## 2021-07-15 DIAGNOSIS — Z7189 Other specified counseling: Secondary | ICD-10-CM

## 2021-07-15 DIAGNOSIS — C773 Secondary and unspecified malignant neoplasm of axilla and upper limb lymph nodes: Secondary | ICD-10-CM

## 2021-07-15 LAB — COMPREHENSIVE METABOLIC PANEL
ALT: 11 U/L (ref 0–44)
AST: 13 U/L — ABNORMAL LOW (ref 15–41)
Albumin: 3.5 g/dL (ref 3.5–5.0)
Alkaline Phosphatase: 61 U/L (ref 38–126)
Anion gap: 8 (ref 5–15)
BUN: 23 mg/dL (ref 8–23)
CO2: 20 mmol/L — ABNORMAL LOW (ref 22–32)
Calcium: 8.1 mg/dL — ABNORMAL LOW (ref 8.9–10.3)
Chloride: 114 mmol/L — ABNORMAL HIGH (ref 98–111)
Creatinine, Ser: 1.36 mg/dL — ABNORMAL HIGH (ref 0.44–1.00)
GFR, Estimated: 42 mL/min — ABNORMAL LOW (ref >60)
Glucose, Bld: 127 mg/dL — ABNORMAL HIGH (ref 70–99)
Potassium: 3.3 mmol/L — ABNORMAL LOW (ref 3.5–5.1)
Sodium: 142 mmol/L (ref 135–145)
Total Bilirubin: 0.5 mg/dL (ref 0.3–1.2)
Total Protein: 5.8 g/dL — ABNORMAL LOW (ref 6.5–8.1)

## 2021-07-15 LAB — CBC WITH DIFFERENTIAL/PLATELET
Abs Immature Granulocytes: 0.02 10*3/uL (ref 0.00–0.07)
Basophils Absolute: 0 10*3/uL (ref 0.0–0.1)
Basophils Relative: 0 %
Eosinophils Absolute: 0 10*3/uL (ref 0.0–0.5)
Eosinophils Relative: 1 %
HCT: 40.7 % (ref 36.0–46.0)
Hemoglobin: 13.1 g/dL (ref 12.0–15.0)
Immature Granulocytes: 0 %
Lymphocytes Relative: 33 %
Lymphs Abs: 1.5 10*3/uL (ref 0.7–4.0)
MCH: 28 pg (ref 26.0–34.0)
MCHC: 32.2 g/dL (ref 30.0–36.0)
MCV: 87 fL (ref 80.0–100.0)
Monocytes Absolute: 0.2 10*3/uL (ref 0.1–1.0)
Monocytes Relative: 5 %
Neutro Abs: 2.8 10*3/uL (ref 1.7–7.7)
Neutrophils Relative %: 61 %
Platelets: 183 10*3/uL (ref 150–400)
RBC: 4.68 MIL/uL (ref 3.87–5.11)
RDW: 15.9 % — ABNORMAL HIGH (ref 11.5–15.5)
WBC: 4.6 10*3/uL (ref 4.0–10.5)
nRBC: 0 % (ref 0.0–0.2)

## 2021-07-15 LAB — TOTAL PROTEIN, URINE DIPSTICK: Protein, ur: 30 mg/dL — AB

## 2021-07-15 MED ORDER — SODIUM CHLORIDE 0.9 % IV SOLN
200.0000 mg | Freq: Once | INTRAVENOUS | Status: AC
  Administered 2021-07-15: 200 mg via INTRAVENOUS
  Filled 2021-07-15: qty 200

## 2021-07-15 MED ORDER — HEPARIN SOD (PORK) LOCK FLUSH 100 UNIT/ML IV SOLN
500.0000 [IU] | Freq: Once | INTRAVENOUS | Status: AC | PRN
  Administered 2021-07-15: 500 [IU]

## 2021-07-15 MED ORDER — SODIUM CHLORIDE 0.9% FLUSH
10.0000 mL | Freq: Once | INTRAVENOUS | Status: AC
  Administered 2021-07-15: 10 mL

## 2021-07-15 MED ORDER — SODIUM CHLORIDE 0.9 % IV SOLN: Freq: Once | INTRAVENOUS | Status: AC

## 2021-07-15 MED ORDER — SODIUM CHLORIDE 0.9% FLUSH
10.0000 mL | INTRAVENOUS | Status: DC | PRN
  Administered 2021-07-15: 10 mL

## 2021-07-15 NOTE — Assessment & Plan Note (Signed)
Her last CT imaging showed stability of the lymph node in the axilla, asymptomatic ?She tolerated treatment well except for acquired hypothyroidism and intermittent uncontrolled hypertension ?I plan to space out imaging study to 6 months in the future, due next in June 2023 ?

## 2021-07-15 NOTE — Assessment & Plan Note (Signed)
Her recent nausea is not related to chemotherapy ?I recommend switching her calcium supplement to chewable Tums ?

## 2021-07-15 NOTE — Patient Instructions (Signed)
[facility]  Discharge Instructions: ?Thank you for choosing [facility] to provide your oncology and hematology care.  ? ?If you have a lab appointment with the Cancer Center, please go directly to the Cancer Center and check in at the registration area. ?  ?Wear comfortable clothing and clothing appropriate for easy access to any Portacath or PICC line.  ? ?We strive to give you quality time with your provider. You may need to reschedule your appointment if you arrive late (15 or more minutes).  Arriving late affects you and other patients whose appointments are after yours.  Also, if you miss three or more appointments without notifying the office, you may be dismissed from the clinic at the provider?s discretion.    ?  ?For prescription refill requests, have your pharmacy contact our office and allow 72 hours for refills to be completed.   ? ?Today you received the following chemotherapy and/or immunotherapy agents: Keytruda ?  ?To help prevent nausea and vomiting after your treatment, we encourage you to take your nausea medication as directed. ? ?BELOW ARE SYMPTOMS THAT SHOULD BE REPORTED IMMEDIATELY: ?*FEVER GREATER THAN 100.4 F (38 ?C) OR HIGHER ?*CHILLS OR SWEATING ?*NAUSEA AND VOMITING THAT IS NOT CONTROLLED WITH YOUR NAUSEA MEDICATION ?*UNUSUAL SHORTNESS OF BREATH ?*UNUSUAL BRUISING OR BLEEDING ?*URINARY PROBLEMS (pain or burning when urinating, or frequent urination) ?*BOWEL PROBLEMS (unusual diarrhea, constipation, pain near the anus) ?TENDERNESS IN MOUTH AND THROAT WITH OR WITHOUT PRESENCE OF ULCERS (sore throat, sores in mouth, or a toothache) ?UNUSUAL RASH, SWELLING OR PAIN  ?UNUSUAL VAGINAL DISCHARGE OR ITCHING  ? ?Items with * indicate a potential emergency and should be followed up as soon as possible or go to the Emergency Department if any problems should occur. ? ?Please show the CHEMOTHERAPY ALERT CARD or IMMUNOTHERAPY ALERT CARD at check-in to the  Emergency Department and triage nurse. ? ?Should you have questions after your visit or need to cancel or reschedule your appointment, please contact [facility]  Dept: [phone]  and follow the prompts.  Office hours are 8:00 a.m. to 4:30 p.m. Monday - Friday. Please note that voicemails left after 4:00 p.m. may not be returned until the following business day.  We are closed weekends and major holidays. You have access to a nurse at all times for urgent questions. Please call the main number to the clinic Dept: [phone] and follow the prompts. ? ? ?For any non-urgent questions, you may also contact your provider using MyChart. We now offer e-Visits for anyone 18 and older to request care online for non-urgent symptoms. For details visit [email or web address]. ?  ?Also download the MyChart app! Go to the app store, search "MyChart", open the app, select [facility], and log in with your MyChart username and password. ? ?Due to Covid, a mask is required upon entering the hospital/clinic. If you do not have a mask, one will be given to you upon arrival. For doctor visits, patients may have 1 support person aged 18 or older with them. For treatment visits, patients cannot have anyone with them due to current Covid guidelines and our immunocompromised population.  ? ?

## 2021-07-15 NOTE — Progress Notes (Signed)
[facility] ?Krista Johnson ? ?Patient Care Team: ?Krista Jordan, MD as PCP - General (Family Medicine) ?Krista Mink Craige Cotta, RN as Registered Nurse (Oncology) ? ?ASSESSMENT & PLAN:  ?Endometrial cancer (Krista Johnson) ?Her last CT imaging showed stability of the lymph node in the axilla, asymptomatic ?She tolerated treatment well except for acquired hypothyroidism and intermittent uncontrolled hypertension ?I plan to space out imaging study to 6 months in the future, due next in June 2023 ? ?Essential hypertension ?She has poorly controlled hypertension ?I worried about exacerbation of blood pressure control while she is on Lenvima ?With recent addition of atenolol, her blood pressure is slightly better controlled ?She will continue the same ?We discussed importance of blood pressure management while on Lenvima ? ?Hypocalcemia ?Her recent nausea is not related to chemotherapy ?I recommend switching her calcium supplement to chewable Tums ? ?No orders of the defined types were placed in this encounter. ? ? ?All questions were answered. The patient knows to call the clinic with any problems, questions or concerns. ?The total time spent in the appointment was 20 minutes encounter with patients including review of chart and various tests results, discussions about plan of care and coordination of care plan ?  ?Krista Lark, MD ?07/15/2021 9:45 AM ? ?INTERVAL HISTORY: ?Please see below for problem oriented charting. ?she returns for treatment follow-up on pembrolizumab and Lenvima for metastatic uterine cancer ?She is doing well ?Her blood pressure is slightly better controlled with recent addition of atenolol ?She complains of recent nausea vomiting 4 to 3 days ?She denies recent constipation ? ?REVIEW OF SYSTEMS:   ?Constitutional: Denies fevers, chills or abnormal weight loss ?Eyes: Denies blurriness of vision ?Ears, nose, mouth, throat, and face: Denies mucositis or sore throat ?Respiratory: Denies cough, dyspnea  or wheezes ?Cardiovascular: Denies palpitation, chest discomfort or lower extremity swelling ?Skin: Denies abnormal skin rashes ?Lymphatics: Denies new lymphadenopathy or easy bruising ?Neurological:Denies numbness, tingling or new weaknesses ?Behavioral/Psych: Mood is stable, no new changes  ?All other systems were reviewed with the patient and are negative. ? ?I have reviewed the past medical history, past surgical history, social history and family history with the patient and they are unchanged from previous Johnson. ? ?ALLERGIES:  is allergic to sulfa antibiotics and sulfamethoxazole. ? ?MEDICATIONS:  ?Current Outpatient Medications  ?Medication Sig Dispense Refill  ? amLODipine-valsartan (EXFORGE) 5-160 MG tablet Take 1 tablet by mouth daily.    ? atenolol (TENORMIN) 25 MG tablet Take 1 tablet (25 mg total) by mouth daily. 30 tablet 1  ? Calcium Carb-Cholecalciferol 600-800 MG-UNIT TABS Take 1 tablet by mouth daily.    ? ergocalciferol (VITAMIN D2) 1.25 MG (50000 UT) capsule Take 1 capsule (50,000 Units total) by mouth 2 (two) times a week. 25 capsule 1  ? glucose blood (ONETOUCH VERIO) test strip Use as instructed to check blood sugars 3 times a day. 100 each 3  ? lenvatinib 10 mg daily dose (LENVIMA, 10 MG DAILY DOSE,) capsule Take 1 capsule (10 mg total) by mouth daily. 30 capsule 11  ? levothyroxine (EUTHYROX) 200 MCG tablet Take 1 tablet (200 mcg total) by mouth daily before breakfast. 30 tablet 1  ? lidocaine-prilocaine (EMLA) cream Apply topically daily as needed. 30 g 3  ? OneTouch Delica Lancets 46N MISC 1 each by Other route 2 (two) times daily. Use to monitor glucose levels BID; E11.65 100 each 2  ? RELION PEN NEEDLES 32G X 4 MM MISC USE 1 PEN PER DAY 50 each 5  ?  simvastatin (ZOCOR) 10 MG tablet Take 10 mg by mouth at bedtime.     ? ?No current facility-administered medications for this visit.  ? ?Facility-Administered Medications Ordered in Other Visits  ?Medication Dose Route Frequency Provider Last  Rate Last Admin  ? heparin lock flush 100 unit/mL  500 Units Intracatheter Once PRN Krista Lark, MD      ? pembrolizumab (KEYTRUDA) 200 mg in sodium chloride 0.9 % 50 mL chemo infusion  200 mg Intravenous Once Krista Lark, MD 116 mL/hr at 07/15/21 0943 200 mg at 07/15/21 0943  ? sodium chloride flush (NS) 0.9 % injection 10 mL  10 mL Intracatheter PRN Krista Lark, MD      ? ? 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 ?ER 80%, PR 60%, Her2/neu neg ?  ?Endometrial cancer (Krista Johnson)  ?11/20/2017 Initial Diagnosis  ? The patient noted some postmenopausal bleeding and was promptly seen by Dr. Leo Johnson who obtained an endometrial biopsy showing poorly differentiated endometrial carcinoma and negative endocervical curettage ?  ?12/12/2017 Imaging  ? MAMMOGRAM FINDINGS: ?In the right axilla, a possible mass warrants further evaluation. In the left breast, no findings suspicious for malignancy. ?  ?Images were processed with CAD. ?  ?IMPRESSION: ?Further evaluation is suggested for possible mass in the right axilla. ?  ?  ?12/24/2017 Imaging  ? Ct scan chest, abdomen and pelvis ?1. Marked thickening of the endometrium (42 mm) compatible with known primary endometrial malignancy. No evidence of extrauterine invasion. ?2. No pelvic or retroperitoneal adenopathy. ?3. Right middle lobe 4 mm solid pulmonary nodule, for which follow-up chest CT is advised in 3-6 months. ?4. Several findings that are equivocal for distant metastatic disease. Vaguely nodular heterogeneous hyperenhancement in the peripheral right liver lobe, which could represent benign transient perfusional phenomena, with underlying liver lesions not entirely excluded. Mildly sclerotic T12 vertebral lesion. Mildly enlarged right axillary lymph node. The best single test to further evaluate these findings would be a PET-CT. Alternative tests include bone  scan or thoracic MRI without and with IV contrast for the T12 osseous lesion, MRI abdomen without and with IV contrast for the liver findings, and diagnostic mammographic evaluation for the right axillary node. ?5.  Aortic Atherosclerosis (ICD10-I70.0). ?  ?  ?01/09/2018 PET scan  ? 1. Moderate hypermetabolism corresponding to enlarging axillary node since 12/22/2017. Highly suspicious for an atypical distribution of metastatic disease. ?2. No hypermetabolism to suggest hepatic or T12 osseous metastasis. ?3. Hypermetabolic endometrial primary. ?  ?01/23/2018 Initial Diagnosis  ? Endometrial cancer [facility]) ?  ?01/23/2018 Pathology Results  ? 1. Lymph node, sentinel, biopsy, left external iliac ?- NO CARCINOMA IDENTIFIED IN ONE LYMPH NODE (0/1) ?- SEE COMMENT ?2. Lymph nodes, regional resection, right para aortic ?- NO CARCINOMA IDENTIFIED IN FOUR LYMPH NODES (0/4) ?- SEE COMMENT ?3. Lymph nodes, regional resection, right pelvic ?- NO CARCINOMA IDENTIFIED IN EIGHT LYMPH NODES (0/8) ?- SEE COMMENT ?4. Cul-de-sac biopsy ?- METASTATIC CARCINOMA ?5. Uterus +/- tubes/ovaries, neoplastic, cervix, bilateral fallopian tubes and ovaries ?UTERUS: ?- MIXED SEROUS AND ENDOMETRIOID CARCINOMA ?- SEROSAL IMPLANTS PRESENT ?- LYMPHOVASCULAR SPACE INVASION PRESENT ?- LEIOMYOMATA (1.5 CM; LARGEST) ?- SEE ONCOLOGY TABLE AND COMMENT BELOW ?CERVIX: ?- BENIGN NABOTHIAN CYSTS ?- NO CARCINOMA IDENTIFIED ?BILATERAL OVARIES: ?- METASTATIC CARCINOMA PRESENT ON OVARIAN SURFACE ?BILATERAL FALLOPIAN TUBES: ?- INTRALUMINAL CARCINOMA ?Microscopic Comment ?1. -3. Cytokeratin AE1/3 was performed on the sentinel lymph nodes to exclude micrometastasis.  There is no ?evidence of metastatic carcinoma by immunohistochemistry. ?5. UTERUS, CARCINOMA OR CARCINOSARCOMA ? ?Procedure: Hysterectomy, bilateral salpingo-oophorectomy, peritoneal biopsy, sentinel lymph node biopsy and pelvic lymph node resection ?Histologic type: Mixed carcinoma composed of serous carcinoma  (~80%) and endometrioid carcinoma (~20%) ?Histologic Grade: N/A ?Myometrial invasion: Estimated less than 50% myometrial invasion (0.3 cm of myometrium involved; 1.4 cm measured thickness) ?Uterine

## 2021-07-15 NOTE — Assessment & Plan Note (Signed)
She has poorly controlled hypertension ?I worried about exacerbation of blood pressure control while she is on Lenvima ?With recent addition of atenolol, her blood pressure is slightly better controlled ?She will continue the same ?We discussed importance of blood pressure management while on Lenvima ?

## 2021-07-16 DIAGNOSIS — Z20822 Contact with and (suspected) exposure to covid-19: Secondary | ICD-10-CM | POA: Diagnosis not present

## 2021-07-18 IMAGING — CT CT CHEST W/O CM
2 of 4 series · 15 of 36 positions shown, 18 images · non-contrast
Comparison: April 02, 2020.

CLINICAL DATA: Uterine/cervical cancer with metastatic disease.
Assess treatment response. 69-year-old female

EXAM:
CT CHEST WITHOUT CONTRAST
TECHNIQUE: Multidetector CT imaging of the chest was performed following the
standard protocol without IV contrast.

[Series 2: thorax · axial · 0.75mm/px · z∈[-9,+267]mm · 12 of 162 slices shown, 15 images]
[im 12/162  mediastinal]
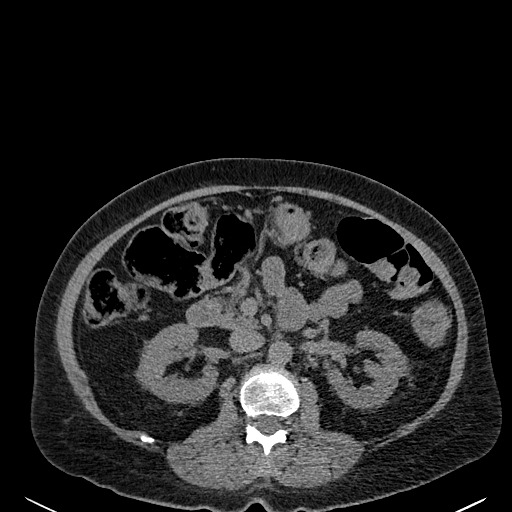
[im 12/162  lung]
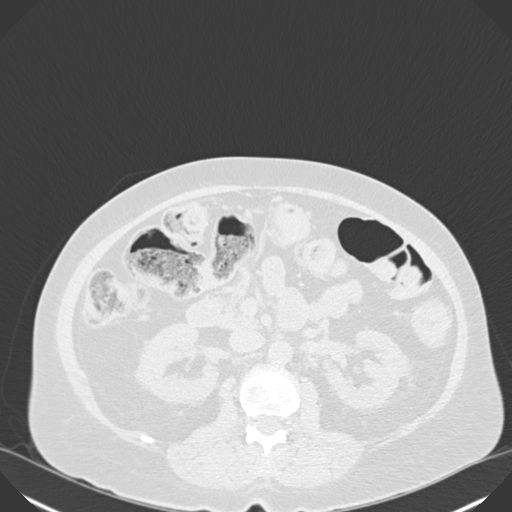
[im 24/162  lung]
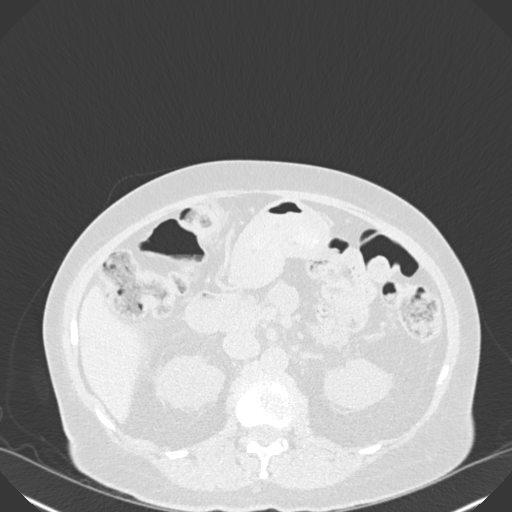
[im 35/162  lung]
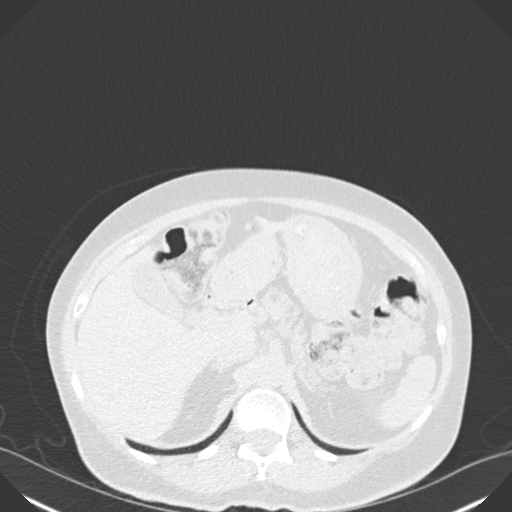
[im 47/162  lung]
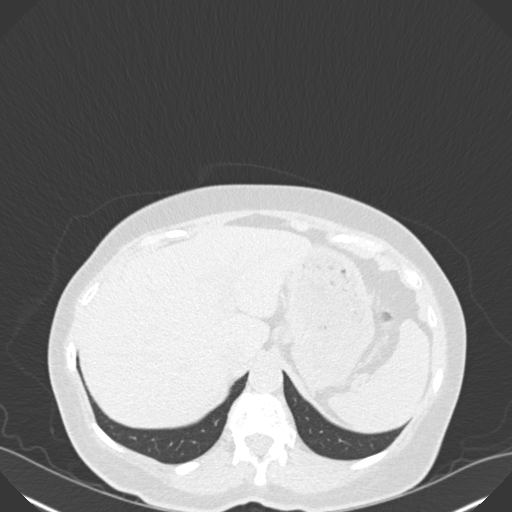
[im 58/162  mediastinal]
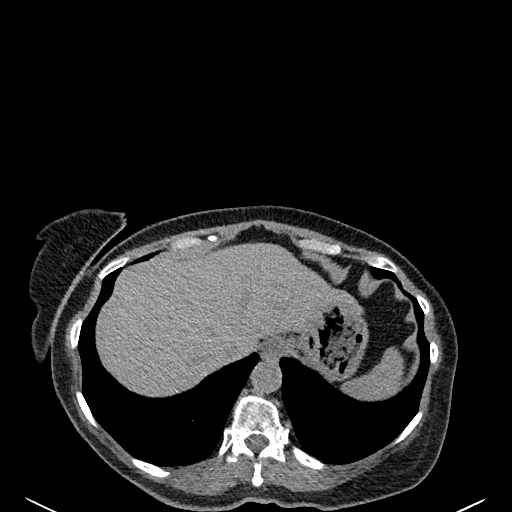
[im 58/162  lung]
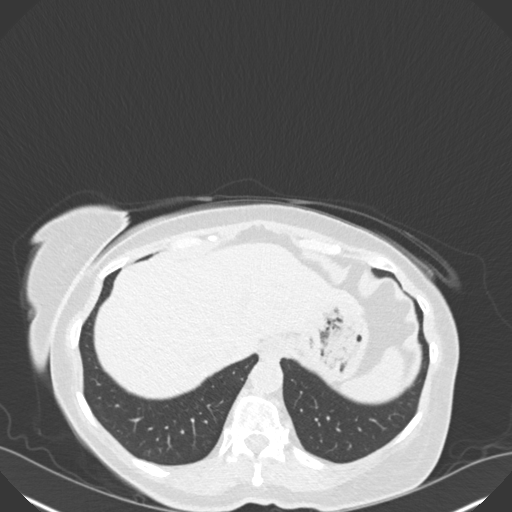
[im 70/162  lung]
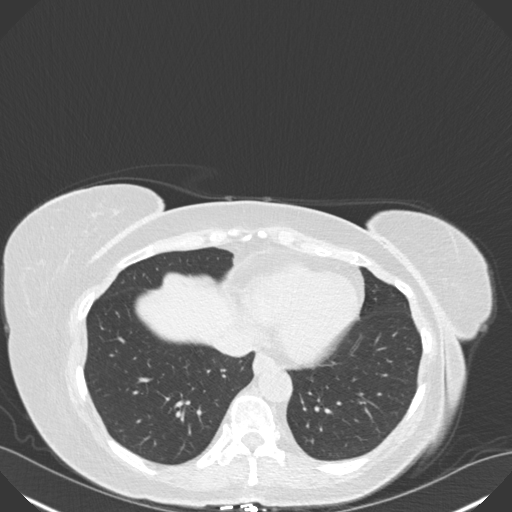
[im 93/162  lung]
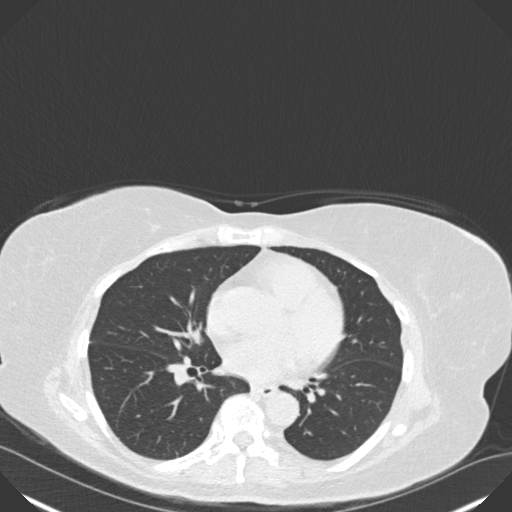
[im 104/162  lung]
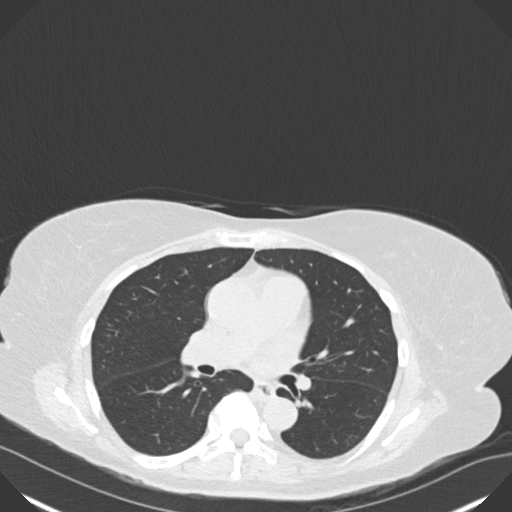
[im 116/162  mediastinal]
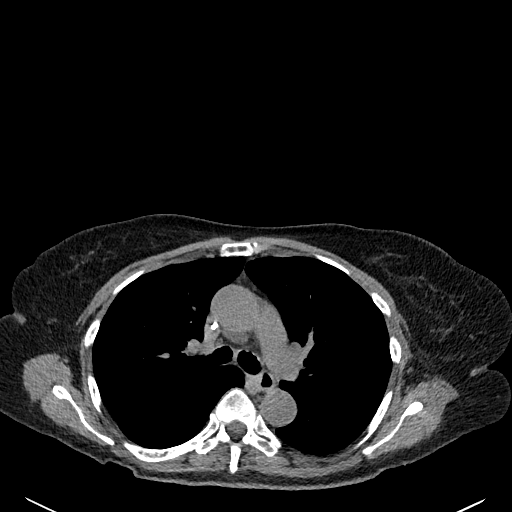
[im 116/162  lung]
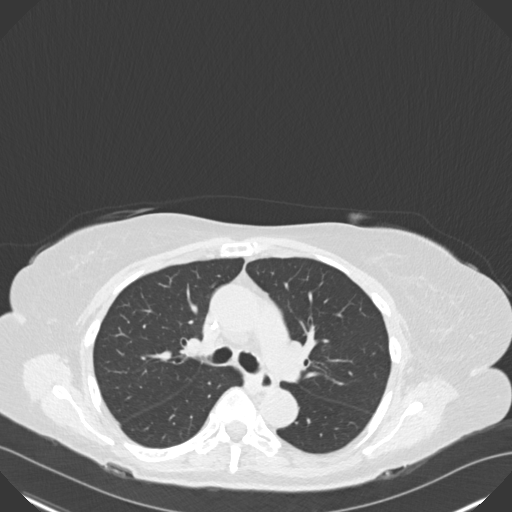
[im 127/162  lung]
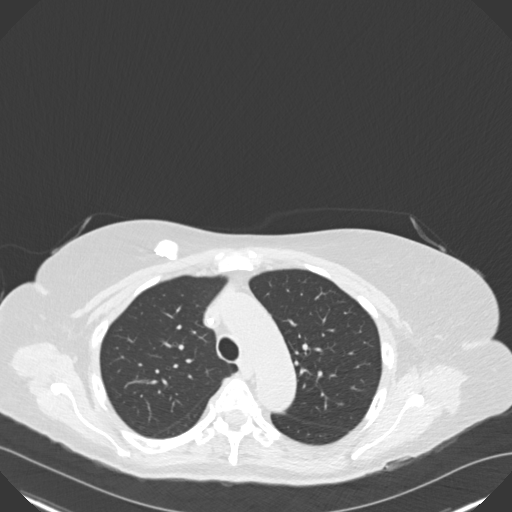
[im 139/162  lung]
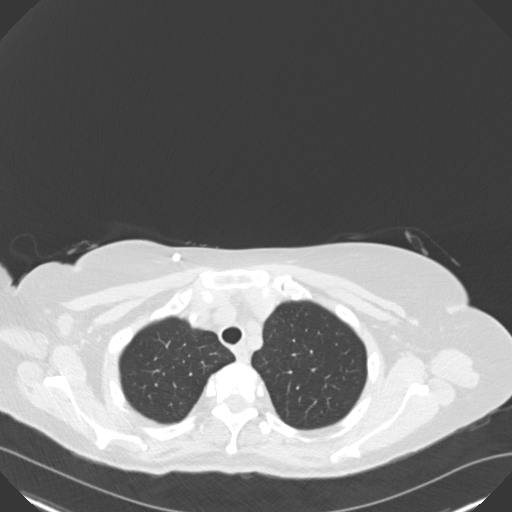
[im 150/162  lung]
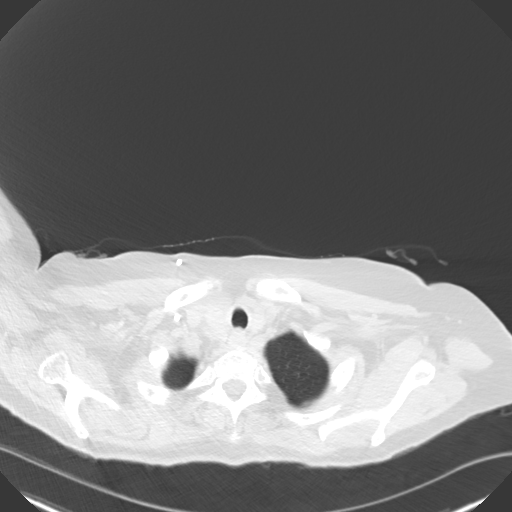

[Series 5: coronal · coronal · 0.73mm/px · 3 of 118 slices shown]
[im 24/118  lung]
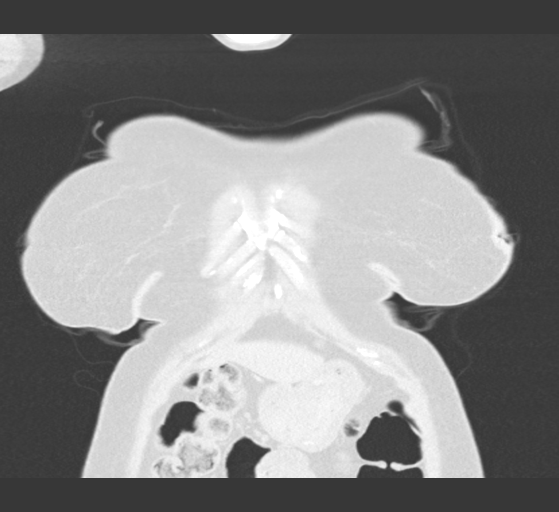
[im 47/118  lung]
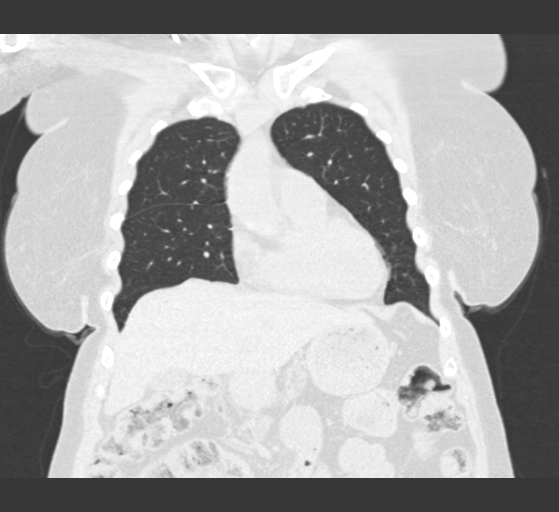
[im 71/118  lung]
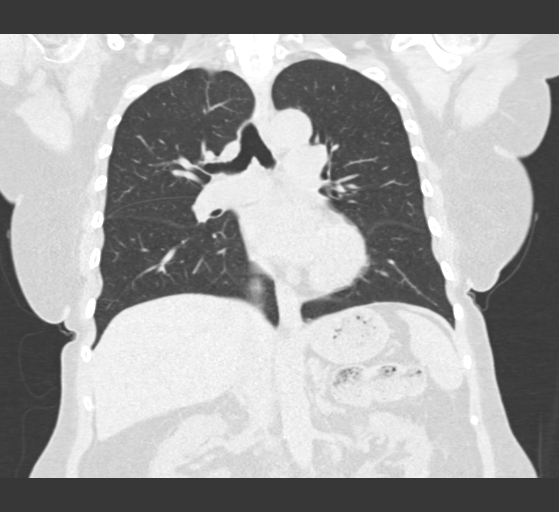

[15 of 36 positions shown; findings below may reference images not displayed]

FINDINGS: Cardiovascular: RIGHT-sided Port-A-Cath terminates at the distal
aspect of the superior vena cava. Heart size is normal. No
substantial pericardial effusion. Stable appearance of central
pulmonary vasculature. Limited assessment of cardiovascular
structures given lack of intravenous contrast. Aortic caliber is
normal with scattered aortic atherosclerosis.

Mediastinum/Nodes: Thoracic inlet structures are unremarkable. No
LEFT axillary lymphadenopathy. RIGHT axillary lymph node with
enlargement measuring 2 cm short axis, unchanged compared to
previous imaging. Esophagus grossly normal. No mediastinal
adenopathy. No gross hilar lymphadenopathy.

Lungs/Pleura: Stable small nodule along the minor fissure in the
RIGHT chest (image 59/7)

No effusion. No consolidation. Airways are patent. No new pulmonary
nodule.

Upper Abdomen: Low-density RIGHT renal lesion incompletely imaged,
grossly unchanged with respect imaged portions. Imaged portions of
liver, gallbladder, spleen, pancreas and adrenal glands are
unchanged. No acute upper abdominal findings

Musculoskeletal: No chest wall mass no acute or destructive bone
process. Spinal degenerative changes.
IMPRESSION: 1. Stable RIGHT axillary lymph node with enlargement measuring 2 cm
short axis.
2. Stable small nodule along the minor fissure in the RIGHT chest.
3. No new or progressive findings.
4. Aortic atherosclerosis.

Aortic Atherosclerosis (03ES1-W53.3).

## 2021-07-23 DIAGNOSIS — M1711 Unilateral primary osteoarthritis, right knee: Secondary | ICD-10-CM | POA: Diagnosis not present

## 2021-07-27 ENCOUNTER — Ambulatory Visit (INDEPENDENT_AMBULATORY_CARE_PROVIDER_SITE_OTHER): Payer: Medicare Other | Admitting: Podiatry

## 2021-07-27 ENCOUNTER — Encounter: Payer: Self-pay | Admitting: Podiatry

## 2021-07-27 DIAGNOSIS — Q828 Other specified congenital malformations of skin: Secondary | ICD-10-CM | POA: Diagnosis not present

## 2021-07-27 DIAGNOSIS — T451X5A Adverse effect of antineoplastic and immunosuppressive drugs, initial encounter: Secondary | ICD-10-CM

## 2021-07-27 DIAGNOSIS — M79675 Pain in left toe(s): Secondary | ICD-10-CM | POA: Diagnosis not present

## 2021-07-27 DIAGNOSIS — M79674 Pain in right toe(s): Secondary | ICD-10-CM | POA: Diagnosis not present

## 2021-07-27 DIAGNOSIS — E0822 Diabetes mellitus due to underlying condition with diabetic chronic kidney disease: Secondary | ICD-10-CM

## 2021-07-27 DIAGNOSIS — B351 Tinea unguium: Secondary | ICD-10-CM

## 2021-07-27 DIAGNOSIS — Z794 Long term (current) use of insulin: Secondary | ICD-10-CM

## 2021-07-27 DIAGNOSIS — N183 Chronic kidney disease, stage 3 unspecified: Secondary | ICD-10-CM

## 2021-07-27 DIAGNOSIS — L84 Corns and callosities: Secondary | ICD-10-CM

## 2021-07-27 DIAGNOSIS — G620 Drug-induced polyneuropathy: Secondary | ICD-10-CM

## 2021-07-28 DIAGNOSIS — Z20822 Contact with and (suspected) exposure to covid-19: Secondary | ICD-10-CM | POA: Diagnosis not present

## 2021-08-01 ENCOUNTER — Other Ambulatory Visit: Payer: Self-pay | Admitting: Hematology and Oncology

## 2021-08-01 NOTE — Progress Notes (Signed)
?  Subjective:  ?Patient ID: Krista Johnson, female    DOB: Feb 14, 1951,  MRN: [id number] ? ?Krista Johnson presents to clinic today for at risk foot care. Pt has h/o NIDDM with chronic kidney disease and painful porokeratotic lesion(s) bilaterally, corn right foot and painful mycotic toenails that limit ambulation. Painful toenails interfere with ambulation. Aggravating factors include wearing enclosed shoe gear. Pain is relieved with periodic professional debridement. Painful porokeratotic lesions are aggravated when weightbearing with and without shoegear. Pain is relieved with periodic professional debridement. ? ?She also has h/o chemotherapy induced neuropathy. ? ?Patient states blood glucose was 82 mg/dl today.  Last HgA1c was 6.2%. ? ?New problem(s): None.  ? ?PCP is Krista Jordan, MD , and last visit was December 31, 2020. ? ?Allergies  ?Allergen Reactions  ? Sulfa Antibiotics Nausea Only  ? Sulfamethoxazole Nausea Only  ? ? ?Review of Systems: Negative except as noted in the HPI. ? ?Objective: No changes noted in today's physical examination. ?Constitutional Patient is a pleasant 71 y.o. African American female WD, WN in NAD. AAO x 3.  ?Vascular Capillary fill time to digits immediate b/l.  DP/PT pulse(s) are palpable b/l lower extremities. Pedal hair sparse. Lower extremity skin temperature gradient within normal limits. No pain with calf compression b/l. No edema noted b/l lower extremities. No cyanosis or clubbing noted.   ?Neurologic Protective sensation intact 5/5 intact bilaterally with 10g monofilament b/l. Vibratory sensation intact b/l. No clonus b/l. Pt has subjective symptoms of neuropathy.  ?Dermatologic Pedal skin is warm and supple b/l.  No open wounds b/l lower extremities. No interdigital macerations b/l lower extremities. Toenails 1-5 b/l elongated, discolored, dystrophic, thickened, crumbly with subungual debris and tenderness to dorsal palpation. Hyperkeratotic lesion(s) R 5th toe.  No  erythema, no edema, no drainage, no fluctuance. Porokeratotic lesion(s) submet head 4 left foot. No erythema, no edema, no drainage, no fluctuance.  ?Orthopedic: Normal muscle strength 5/5 to all lower extremity muscle groups bilaterally. Patient ambulates independent of any assistive aids. Normal muscle strength 5/5 to all lower extremity muscle groups bilaterally. Hammertoe(s) noted to the bilateral 5th toes.. No pain, crepitus or joint limitation noted with ROM b/l LE.  Patient ambulates independently without assistive aids.  ? ? ?  Latest Ref Rng & Units 05/27/2021  ? 10:01 AM 01/21/2021  ?  9:51 AM 10/20/2020  ?  9:41 AM  ?Hemoglobin A1C  ?Hemoglobin-A1c 4.0 - 5.6 % 6.2   5.8   6.6    ? ?Assessment/Plan: ?1. Pain due to onychomycosis of toenails of both feet   ?2. Corns   ?3. Porokeratosis   ?4. Chemotherapy-induced neuropathy ([place])   ?5. 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  ?-No new findings. No new orders. ?-Toenails 1-5 b/l were debrided in length and girth with sterile nail nippers and dremel without iatrogenic bleeding.  ?-Corn(s) R 5th toe pared utilizing sterile scalpel blade without complication or incident. Total number debrided=1. ?-Painful porokeratotic lesion(s) submet head 4 left foot and submet head 5 right foot pared and enucleated with sterile scalpel blade without incident. Total number of lesions debrided=2. ?-Patient/POA to call should there be question/concern in the interim.  ? ?Return in about 3 months (around 10/26/2021). ? ?Marzetta Board, DPM  ?

## 2021-08-02 ENCOUNTER — Encounter: Payer: Self-pay | Admitting: Hematology and Oncology

## 2021-08-02 DIAGNOSIS — Z20822 Contact with and (suspected) exposure to covid-19: Secondary | ICD-10-CM | POA: Diagnosis not present

## 2021-08-06 ENCOUNTER — Inpatient Hospital Stay: Payer: Medicare Other | Attending: Gynecology

## 2021-08-06 ENCOUNTER — Other Ambulatory Visit: Payer: Self-pay | Admitting: Hematology and Oncology

## 2021-08-06 ENCOUNTER — Encounter: Payer: Self-pay | Admitting: Hematology and Oncology

## 2021-08-06 ENCOUNTER — Other Ambulatory Visit: Payer: Self-pay

## 2021-08-06 ENCOUNTER — Inpatient Hospital Stay (HOSPITAL_BASED_OUTPATIENT_CLINIC_OR_DEPARTMENT_OTHER): Payer: Medicare Other | Admitting: Hematology and Oncology

## 2021-08-06 ENCOUNTER — Inpatient Hospital Stay: Payer: Medicare Other

## 2021-08-06 VITALS — BP 160/88 | HR 65 | Temp 97.6°F | Resp 17 | Wt 152.2 lb

## 2021-08-06 DIAGNOSIS — C541 Malignant neoplasm of endometrium: Secondary | ICD-10-CM

## 2021-08-06 DIAGNOSIS — R918 Other nonspecific abnormal finding of lung field: Secondary | ICD-10-CM

## 2021-08-06 DIAGNOSIS — N183 Chronic kidney disease, stage 3 unspecified: Secondary | ICD-10-CM | POA: Diagnosis not present

## 2021-08-06 DIAGNOSIS — Z5112 Encounter for antineoplastic immunotherapy: Secondary | ICD-10-CM | POA: Diagnosis not present

## 2021-08-06 DIAGNOSIS — C773 Secondary and unspecified malignant neoplasm of axilla and upper limb lymph nodes: Secondary | ICD-10-CM

## 2021-08-06 DIAGNOSIS — Z7189 Other specified counseling: Secondary | ICD-10-CM

## 2021-08-06 DIAGNOSIS — I129 Hypertensive chronic kidney disease with stage 1 through stage 4 chronic kidney disease, or unspecified chronic kidney disease: Secondary | ICD-10-CM | POA: Diagnosis not present

## 2021-08-06 DIAGNOSIS — I10 Essential (primary) hypertension: Secondary | ICD-10-CM | POA: Diagnosis not present

## 2021-08-06 DIAGNOSIS — E039 Hypothyroidism, unspecified: Secondary | ICD-10-CM | POA: Diagnosis not present

## 2021-08-06 DIAGNOSIS — E559 Vitamin D deficiency, unspecified: Secondary | ICD-10-CM | POA: Insufficient documentation

## 2021-08-06 LAB — COMPREHENSIVE METABOLIC PANEL
ALT: 14 U/L (ref 0–44)
AST: 13 U/L — ABNORMAL LOW (ref 15–41)
Albumin: 3.5 g/dL (ref 3.5–5.0)
Alkaline Phosphatase: 59 U/L (ref 38–126)
Anion gap: 9 (ref 5–15)
BUN: 28 mg/dL — ABNORMAL HIGH (ref 8–23)
CO2: 20 mmol/L — ABNORMAL LOW (ref 22–32)
Calcium: 7.9 mg/dL — ABNORMAL LOW (ref 8.9–10.3)
Chloride: 115 mmol/L — ABNORMAL HIGH (ref 98–111)
Creatinine, Ser: 1.55 mg/dL — ABNORMAL HIGH (ref 0.44–1.00)
GFR, Estimated: 36 mL/min — ABNORMAL LOW (ref >60)
Glucose, Bld: 126 mg/dL — ABNORMAL HIGH (ref 70–99)
Potassium: 3.1 mmol/L — ABNORMAL LOW (ref 3.5–5.1)
Sodium: 144 mmol/L (ref 135–145)
Total Bilirubin: 0.4 mg/dL (ref 0.3–1.2)
Total Protein: 5.8 g/dL — ABNORMAL LOW (ref 6.5–8.1)

## 2021-08-06 LAB — CBC WITH DIFFERENTIAL/PLATELET
Abs Immature Granulocytes: 0.02 10*3/uL (ref 0.00–0.07)
Basophils Absolute: 0 10*3/uL (ref 0.0–0.1)
Basophils Relative: 0 %
Eosinophils Absolute: 0 10*3/uL (ref 0.0–0.5)
Eosinophils Relative: 0 %
HCT: 41.2 % (ref 36.0–46.0)
Hemoglobin: 13.2 g/dL (ref 12.0–15.0)
Immature Granulocytes: 0 %
Lymphocytes Relative: 28 %
Lymphs Abs: 1.5 10*3/uL (ref 0.7–4.0)
MCH: 28.3 pg (ref 26.0–34.0)
MCHC: 32 g/dL (ref 30.0–36.0)
MCV: 88.2 fL (ref 80.0–100.0)
Monocytes Absolute: 0.3 10*3/uL (ref 0.1–1.0)
Monocytes Relative: 5 %
Neutro Abs: 3.4 10*3/uL (ref 1.7–7.7)
Neutrophils Relative %: 67 %
Platelets: 153 10*3/uL (ref 150–400)
RBC: 4.67 MIL/uL (ref 3.87–5.11)
RDW: 15.9 % — ABNORMAL HIGH (ref 11.5–15.5)
WBC: 5.2 10*3/uL (ref 4.0–10.5)
nRBC: 0 % (ref 0.0–0.2)

## 2021-08-06 LAB — TOTAL PROTEIN, URINE DIPSTICK: Protein, ur: 30 mg/dL — AB

## 2021-08-06 MED ORDER — SODIUM CHLORIDE 0.9% FLUSH
10.0000 mL | Freq: Once | INTRAVENOUS | Status: AC
  Administered 2021-08-06: 10 mL

## 2021-08-06 MED ORDER — SODIUM CHLORIDE 0.9 % IV SOLN
200.0000 mg | Freq: Once | INTRAVENOUS | Status: AC
  Administered 2021-08-06: 200 mg via INTRAVENOUS
  Filled 2021-08-06: qty 200

## 2021-08-06 MED ORDER — HEPARIN SOD (PORK) LOCK FLUSH 100 UNIT/ML IV SOLN
500.0000 [IU] | Freq: Once | INTRAVENOUS | Status: AC | PRN
  Administered 2021-08-06: 500 [IU]

## 2021-08-06 MED ORDER — SODIUM CHLORIDE 0.9 % IV SOLN: Freq: Once | INTRAVENOUS | Status: AC

## 2021-08-06 MED ORDER — SODIUM CHLORIDE 0.9% FLUSH
10.0000 mL | INTRAVENOUS | Status: DC | PRN
  Administered 2021-08-06: 10 mL

## 2021-08-06 NOTE — Assessment & Plan Note (Signed)
TSH is intermittently elevated, could be related to side effects of checkpoint inhibitors The dose of her Synthroid was recently modified We will continue to monitor that carefully 

## 2021-08-06 NOTE — Assessment & Plan Note (Signed)
Recent renal function fluctuates up and down We discussed the importance of aggressive risk factor modification and to drink plenty of oral fluids 

## 2021-08-06 NOTE — Patient Instructions (Signed)
[facility]  Discharge Instructions: ?Thank you for choosing [facility] to provide your oncology and hematology care.  ? ?If you have a lab appointment with the Cancer Center, please go directly to the Cancer Center and check in at the registration area. ?  ?Wear comfortable clothing and clothing appropriate for easy access to any Portacath or PICC line.  ? ?We strive to give you quality time with your provider. You may need to reschedule your appointment if you arrive late (15 or more minutes).  Arriving late affects you and other patients whose appointments are after yours.  Also, if you miss three or more appointments without notifying the office, you may be dismissed from the clinic at the provider?s discretion.    ?  ?For prescription refill requests, have your pharmacy contact our office and allow 72 hours for refills to be completed.   ? ?Today you received the following chemotherapy and/or immunotherapy agents: Keytruda ?  ?To help prevent nausea and vomiting after your treatment, we encourage you to take your nausea medication as directed. ? ?BELOW ARE SYMPTOMS THAT SHOULD BE REPORTED IMMEDIATELY: ?*FEVER GREATER THAN 100.4 F (38 ?C) OR HIGHER ?*CHILLS OR SWEATING ?*NAUSEA AND VOMITING THAT IS NOT CONTROLLED WITH YOUR NAUSEA MEDICATION ?*UNUSUAL SHORTNESS OF BREATH ?*UNUSUAL BRUISING OR BLEEDING ?*URINARY PROBLEMS (pain or burning when urinating, or frequent urination) ?*BOWEL PROBLEMS (unusual diarrhea, constipation, pain near the anus) ?TENDERNESS IN MOUTH AND THROAT WITH OR WITHOUT PRESENCE OF ULCERS (sore throat, sores in mouth, or a toothache) ?UNUSUAL RASH, SWELLING OR PAIN  ?UNUSUAL VAGINAL DISCHARGE OR ITCHING  ? ?Items with * indicate a potential emergency and should be followed up as soon as possible or go to the Emergency Department if any problems should occur. ? ?Please show the CHEMOTHERAPY ALERT CARD or IMMUNOTHERAPY ALERT CARD at check-in to the  Emergency Department and triage nurse. ? ?Should you have questions after your visit or need to cancel or reschedule your appointment, please contact [facility]  Dept: [phone]  and follow the prompts.  Office hours are 8:00 a.m. to 4:30 p.m. Monday - Friday. Please note that voicemails left after 4:00 p.m. may not be returned until the following business day.  We are closed weekends and major holidays. You have access to a nurse at all times for urgent questions. Please call the main number to the clinic Dept: [phone] and follow the prompts. ? ? ?For any non-urgent questions, you may also contact your provider using MyChart. We now offer e-Visits for anyone 18 and older to request care online for non-urgent symptoms. For details visit [email or web address]. ?  ?Also download the MyChart app! Go to the app store, search "MyChart", open the app, select [facility], and log in with your MyChart username and password. ? ?Due to Covid, a mask is required upon entering the hospital/clinic. If you do not have a mask, one will be given to you upon arrival. For doctor visits, patients may have 1 support person aged 18 or older with them. For treatment visits, patients cannot have anyone with them due to current Covid guidelines and our immunocompromised population.  ? ?

## 2021-08-06 NOTE — Assessment & Plan Note (Signed)
She has poorly controlled hypertension, exacerbated while she is on Lenvima ?With recent addition of atenolol, her blood pressure is slightly better controlled ?She will continue the same ?We discussed importance of blood pressure management while on Lenvima ?

## 2021-08-06 NOTE — Assessment & Plan Note (Signed)
She is known to have low vitamin D and is on high-dose vitamin D replacement therapy ?I recommend increasing calcium carbonate to 2 twice a day ?

## 2021-08-06 NOTE — Progress Notes (Signed)
[facility] ?OFFICE PROGRESS NOTE ? ?Patient Care Team: ?Jonathon Jordan, MD as PCP - General (Family Medicine) ?Awanda Mink Craige Cotta, RN as Registered Nurse (Oncology) ? ?ASSESSMENT & PLAN:  ?Endometrial cancer ([place]) ?Her last CT imaging showed stability of the lymph node in the axilla, asymptomatic ?She tolerated treatment well except for acquired hypothyroidism and intermittent uncontrolled hypertension ?I plan to space out imaging study to 6 months in the future, due next around June 2023 ? ?Chronic kidney disease (CKD), stage III (moderate) (HCC) ?Recent renal function fluctuates up and down ?We discussed the importance of aggressive risk factor modification and to drink plenty of oral fluids ? ?Acquired hypothyroidism ?TSH is intermittently elevated, could be related to side effects of checkpoint inhibitors ?The dose of her Synthroid was recently modified ?We will continue to monitor that carefully ? ?Essential hypertension ?She has poorly controlled hypertension, exacerbated while she is on Lenvima ?With recent addition of atenolol, her blood pressure is slightly better controlled ?She will continue the same ?We discussed importance of blood pressure management while on Lenvima ? ?Hypocalcemia ?She is known to have low vitamin D and is on high-dose vitamin D replacement therapy ?I recommend increasing calcium carbonate to 2 twice a day ? ?Orders Placed This Encounter  ?Procedures  ? CT CHEST ABDOMEN PELVIS W CONTRAST  ?  Standing Status:   Future  ?  Standing Expiration Date:   08/07/2022  ?  Order Specific Question:   Preferred imaging location?  ?  Answer:   [facility]  ?  Order Specific Question:   Radiology Contrast Protocol - do NOT remove file path  ?  Answer:   [email or web address]  ? ? ?All questions were answered. The patient knows to call the clinic with any problems, questions or concerns. ?The total time spent in the appointment was 30 minutes  encounter with patients including review of chart and various tests results, discussions about plan of care and coordination of care plan ?  ?[name], MD ?08/06/2021 8:34 AM ? ?INTERVAL HISTORY: ?Please see below for problem oriented charting. ?she returns for treatment follow-up seen prior to infusion Keytruda ?She is taking Lenvima at home ?Her documented blood pressure at home is usually satisfactory with systolic blood pressure between 1 30-1 40s and heart rate in the 70s ?Denies any symptomatic headache with elevated blood pressure ?She stated she is drinking lots of fluids ?She is also taking calcium with vitamin D and denies muscle cramps with hypocalcemia ? ?REVIEW OF SYSTEMS:   ?Constitutional: Denies fevers, chills or abnormal weight loss ?Eyes: Denies blurriness of vision ?Ears, nose, mouth, throat, and face: Denies mucositis or sore throat ?Respiratory: Denies cough, dyspnea or wheezes ?Cardiovascular: Denies palpitation, chest discomfort or lower extremity swelling ?Gastrointestinal:  Denies nausea, heartburn or change in bowel habits ?Skin: Denies abnormal skin rashes ?Lymphatics: Denies new lymphadenopathy or easy bruising ?Neurological:Denies numbness, tingling or new weaknesses ?Behavioral/Psych: Mood is stable, no new changes  ?All other systems were reviewed with the patient and are negative. ? ?I have reviewed the past medical history, past surgical history, social history and family history with the patient and they are unchanged from previous note. ? ?ALLERGIES:  is allergic to sulfa antibiotics and sulfamethoxazole. ? ?MEDICATIONS:  ?Current Outpatient Medications  ?Medication Sig Dispense Refill  ? calcium carbonate (TUMS - DOSED IN MG ELEMENTAL CALCIUM) 500 MG chewable tablet Chew 2 tablets by mouth 2 (two) times daily.    ? amLODipine-valsartan (EXFORGE) 5-160 MG tablet Take  1 tablet by mouth daily.    ? atenolol (TENORMIN) 25 MG tablet Take 1 tablet by mouth once daily 30 tablet 0  ?  Azelastine HCl 137 MCG/SPRAY SOLN Place into both nostrils.    ? bupivacaine, PF, (MARCAINE) 0.25 % SOLN injection Inject into the skin.    ? Calcium Carb-Cholecalciferol 600-800 MG-UNIT TABS Take 1 tablet by mouth daily.    ? ergocalciferol (VITAMIN D2) 1.25 MG (50000 UT) capsule Take 1 capsule (50,000 Units total) by mouth 2 (two) times a week. 25 capsule 1  ? glipiZIDE (GLUCOTROL) 5 MG tablet Take by mouth.    ? glucose blood (ONETOUCH VERIO) test strip Use as instructed to check blood sugars 3 times a day. 100 each 3  ? lenvatinib 10 mg daily dose (LENVIMA, 10 MG DAILY DOSE,) capsule Take 1 capsule (10 mg total) by mouth daily. 30 capsule 11  ? levothyroxine (EUTHYROX) 200 MCG tablet Take 1 tablet (200 mcg total) by mouth daily before breakfast. 30 tablet 1  ? lidocaine-prilocaine (EMLA) cream Apply topically daily as needed. 30 g 3  ? OneTouch Delica Lancets 27O MISC 1 each by Other route 2 (two) times daily. Use to monitor glucose levels BID; E11.65 100 each 2  ? RELION PEN NEEDLES 32G X 4 MM MISC USE 1 PEN PER DAY 50 each 5  ? simvastatin (ZOCOR) 10 MG tablet Take 10 mg by mouth at bedtime.     ? ?No current facility-administered medications for this visit.  ? ?Facility-Administered Medications Ordered in Other Visits  ?Medication Dose Route Frequency Provider Last Rate Last Admin  ? 0.9 %  sodium chloride infusion   Intravenous Once Alvy Bimler, Gael Londo, MD      ? heparin lock flush 100 unit/mL  500 Units Intracatheter Once PRN [name], MD      ? pembrolizumab (KEYTRUDA) 200 mg in sodium chloride 0.9 % 50 mL chemo infusion  200 mg Intravenous Once Alvy Bimler, Karris Deangelo, MD      ? sodium chloride flush (NS) 0.9 % injection 10 mL  10 mL Intracatheter PRN [name], MD      ? ? 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 ?ER 80%, PR 60%, Her2/neu neg ?  ?Endometrial cancer ([place])  ?11/20/2017 Initial  Diagnosis  ? The patient noted some postmenopausal bleeding and was promptly seen by Dr. Leo Grosser who obtained an endometrial biopsy showing poorly differentiated endometrial carcinoma and negative endocervical curettage ? ?  ?12/12/2017 Imaging  ? MAMMOGRAM FINDINGS: ?In the right axilla, a possible mass warrants further evaluation. In the left breast, no findings suspicious for malignancy. ?  ?Images were processed with CAD. ?  ?IMPRESSION: ?Further evaluation is suggested for possible mass in the right axilla. ?  ? ?  ?12/24/2017 Imaging  ? Ct scan chest, abdomen and pelvis ?1. Marked thickening of the endometrium (42 mm) compatible with known primary endometrial malignancy. No evidence of extrauterine invasion. ?2. No pelvic or retroperitoneal adenopathy. ?3. Right middle lobe 4 mm solid pulmonary nodule, for which follow-up chest CT is advised in 3-6 months. ?4. Several findings that are equivocal for distant metastatic disease. Vaguely nodular heterogeneous hyperenhancement in the peripheral right liver lobe, which could represent benign transient perfusional phenomena, with underlying liver lesions not entirely excluded. Mildly sclerotic T12 vertebral lesion. Mildly enlarged right axillary lymph node. The best single test to further evaluate these findings would be a  PET-CT. Alternative tests include bone scan or thoracic MRI without and with IV contrast for the T12 osseous lesion, MRI abdomen without and with IV contrast for the liver findings, and diagnostic mammographic evaluation for the right axillary node. ?5.  Aortic Atherosclerosis (ICD10-I70.0). ?  ? ?  ?01/09/2018 PET scan  ? 1. Moderate hypermetabolism corresponding to enlarging axillary node since 12/22/2017. Highly suspicious for an atypical distribution of metastatic disease. ?2. No hypermetabolism to suggest hepatic or T12 osseous metastasis. ?3. Hypermetabolic endometrial primary. ? ?  ?01/23/2018 Initial Diagnosis  ? Endometrial cancer ([place]) ? ?   ?01/23/2018 Pathology Results  ? 1. Lymph node, sentinel, biopsy, left external iliac ?- NO CARCINOMA IDENTIFIED IN ONE LYMPH NODE (0/1) ?- SEE COMMENT ?2. Lymph nodes, regional resection, right para aortic ?- NO

## 2021-08-06 NOTE — Assessment & Plan Note (Addendum)
Her last CT imaging showed stability of the lymph node in the axilla, asymptomatic ?She tolerated treatment well except for acquired hypothyroidism and intermittent uncontrolled hypertension ?I plan to space out imaging study to 6 months in the future, due next around June 2023 ?

## 2021-08-19 ENCOUNTER — Other Ambulatory Visit: Payer: Self-pay | Admitting: Hematology and Oncology

## 2021-08-19 DIAGNOSIS — Z20822 Contact with and (suspected) exposure to covid-19: Secondary | ICD-10-CM | POA: Diagnosis not present

## 2021-08-23 DIAGNOSIS — Z20822 Contact with and (suspected) exposure to covid-19: Secondary | ICD-10-CM | POA: Diagnosis not present

## 2021-08-25 ENCOUNTER — Ambulatory Visit: Payer: Medicare Other | Admitting: Endocrinology

## 2021-08-27 ENCOUNTER — Inpatient Hospital Stay: Payer: Medicare Other | Attending: Gynecology

## 2021-08-27 ENCOUNTER — Other Ambulatory Visit: Payer: Self-pay

## 2021-08-27 ENCOUNTER — Inpatient Hospital Stay: Payer: Medicare Other

## 2021-08-27 ENCOUNTER — Telehealth: Payer: Self-pay

## 2021-08-27 ENCOUNTER — Other Ambulatory Visit (HOSPITAL_COMMUNITY): Payer: Self-pay

## 2021-08-27 ENCOUNTER — Encounter: Payer: Self-pay | Admitting: Hematology and Oncology

## 2021-08-27 ENCOUNTER — Other Ambulatory Visit: Payer: Self-pay | Admitting: Hematology and Oncology

## 2021-08-27 ENCOUNTER — Inpatient Hospital Stay: Payer: Medicare Other | Admitting: Hematology and Oncology

## 2021-08-27 VITALS — BP 175/94 | HR 72 | Temp 97.8°F | Resp 18 | Ht 66.0 in | Wt 152.0 lb

## 2021-08-27 DIAGNOSIS — C773 Secondary and unspecified malignant neoplasm of axilla and upper limb lymph nodes: Secondary | ICD-10-CM

## 2021-08-27 DIAGNOSIS — Z5112 Encounter for antineoplastic immunotherapy: Secondary | ICD-10-CM | POA: Insufficient documentation

## 2021-08-27 DIAGNOSIS — Z7189 Other specified counseling: Secondary | ICD-10-CM

## 2021-08-27 DIAGNOSIS — E039 Hypothyroidism, unspecified: Secondary | ICD-10-CM

## 2021-08-27 DIAGNOSIS — C541 Malignant neoplasm of endometrium: Secondary | ICD-10-CM

## 2021-08-27 DIAGNOSIS — I10 Essential (primary) hypertension: Secondary | ICD-10-CM

## 2021-08-27 DIAGNOSIS — R918 Other nonspecific abnormal finding of lung field: Secondary | ICD-10-CM | POA: Diagnosis not present

## 2021-08-27 DIAGNOSIS — Z79899 Other long term (current) drug therapy: Secondary | ICD-10-CM | POA: Insufficient documentation

## 2021-08-27 DIAGNOSIS — N183 Chronic kidney disease, stage 3 unspecified: Secondary | ICD-10-CM | POA: Insufficient documentation

## 2021-08-27 LAB — CBC WITH DIFFERENTIAL/PLATELET
Abs Immature Granulocytes: 0.02 10*3/uL (ref 0.00–0.07)
Basophils Absolute: 0 10*3/uL (ref 0.0–0.1)
Basophils Relative: 0 %
Eosinophils Absolute: 0 10*3/uL (ref 0.0–0.5)
Eosinophils Relative: 1 %
HCT: 45.1 % (ref 36.0–46.0)
Hemoglobin: 14.4 g/dL (ref 12.0–15.0)
Immature Granulocytes: 1 %
Lymphocytes Relative: 34 %
Lymphs Abs: 1.5 10*3/uL (ref 0.7–4.0)
MCH: 28.6 pg (ref 26.0–34.0)
MCHC: 31.9 g/dL (ref 30.0–36.0)
MCV: 89.5 fL (ref 80.0–100.0)
Monocytes Absolute: 0.2 10*3/uL (ref 0.1–1.0)
Monocytes Relative: 6 %
Neutro Abs: 2.6 10*3/uL (ref 1.7–7.7)
Neutrophils Relative %: 58 %
Platelets: 165 10*3/uL (ref 150–400)
RBC: 5.04 MIL/uL (ref 3.87–5.11)
RDW: 16.5 % — ABNORMAL HIGH (ref 11.5–15.5)
WBC: 4.4 10*3/uL (ref 4.0–10.5)
nRBC: 0 % (ref 0.0–0.2)

## 2021-08-27 LAB — COMPREHENSIVE METABOLIC PANEL
ALT: 16 U/L (ref 0–44)
AST: 15 U/L (ref 15–41)
Albumin: 3.7 g/dL (ref 3.5–5.0)
Alkaline Phosphatase: 56 U/L (ref 38–126)
Anion gap: 6 (ref 5–15)
BUN: 25 mg/dL — ABNORMAL HIGH (ref 8–23)
CO2: 23 mmol/L (ref 22–32)
Calcium: 8.1 mg/dL — ABNORMAL LOW (ref 8.9–10.3)
Chloride: 114 mmol/L — ABNORMAL HIGH (ref 98–111)
Creatinine, Ser: 1.46 mg/dL — ABNORMAL HIGH (ref 0.44–1.00)
GFR, Estimated: 38 mL/min — ABNORMAL LOW (ref >60)
Glucose, Bld: 88 mg/dL (ref 70–99)
Potassium: 3.2 mmol/L — ABNORMAL LOW (ref 3.5–5.1)
Sodium: 143 mmol/L (ref 135–145)
Total Bilirubin: 0.5 mg/dL (ref 0.3–1.2)
Total Protein: 6.1 g/dL — ABNORMAL LOW (ref 6.5–8.1)

## 2021-08-27 LAB — TOTAL PROTEIN, URINE DIPSTICK: Protein, ur: 30 mg/dL — AB

## 2021-08-27 LAB — TSH: TSH: 16.524 u[IU]/mL — ABNORMAL HIGH (ref 0.350–4.500)

## 2021-08-27 MED ORDER — SODIUM CHLORIDE 0.9 % IV SOLN
200.0000 mg | Freq: Once | INTRAVENOUS | Status: AC
  Administered 2021-08-27: 200 mg via INTRAVENOUS
  Filled 2021-08-27: qty 200

## 2021-08-27 MED ORDER — LEVOTHYROXINE SODIUM 50 MCG PO TABS: 50.0000 ug | ORAL_TABLET | Freq: Every day | ORAL | 0 refills | Status: DC

## 2021-08-27 MED ORDER — SODIUM CHLORIDE 0.9 % IV SOLN: Freq: Once | INTRAVENOUS | Status: AC

## 2021-08-27 MED ORDER — SODIUM CHLORIDE 0.9% FLUSH
10.0000 mL | Freq: Once | INTRAVENOUS | Status: AC
  Administered 2021-08-27: 10 mL

## 2021-08-27 MED ORDER — ALTEPLASE 2 MG IJ SOLR
2.0000 mg | Freq: Once | INTRAMUSCULAR | Status: AC
  Administered 2021-08-27: 2 mg
  Filled 2021-08-27: qty 2

## 2021-08-27 MED ORDER — SODIUM CHLORIDE 0.9% FLUSH
10.0000 mL | INTRAVENOUS | Status: DC | PRN
  Administered 2021-08-27: 10 mL

## 2021-08-27 MED ORDER — HEPARIN SOD (PORK) LOCK FLUSH 100 UNIT/ML IV SOLN
500.0000 [IU] | Freq: Once | INTRAVENOUS | Status: AC | PRN
  Administered 2021-08-27: 500 [IU]

## 2021-08-27 MED ORDER — LIDOCAINE-PRILOCAINE 2.5-2.5 % EX CREA
TOPICAL_CREAM | Freq: Every day | CUTANEOUS | 3 refills | Status: DC | PRN
  Filled 2021-08-27: qty 30, 30d supply, fill #0

## 2021-08-27 NOTE — Telephone Encounter (Signed)
-----   Message from [name], MD sent at 08/27/2021  2:46 PM EDT ----- ?HER TSH is still very high ?I am still not certain if she is taking it correctly 30 mins before breakfast ?I recommend adding 50 mcg to her current 200 mcg and pls call in to her pharmacy ? ?

## 2021-08-27 NOTE — Patient Instructions (Signed)
[facility]  Discharge Instructions: ?Thank you for choosing [facility] to provide your oncology and hematology care.  ? ?If you have a lab appointment with the Cancer Center, please go directly to the Cancer Center and check in at the registration area. ?  ?Wear comfortable clothing and clothing appropriate for easy access to any Portacath or PICC line.  ? ?We strive to give you quality time with your provider. You may need to reschedule your appointment if you arrive late (15 or more minutes).  Arriving late affects you and other patients whose appointments are after yours.  Also, if you miss three or more appointments without notifying the office, you may be dismissed from the clinic at the provider?s discretion.    ?  ?For prescription refill requests, have your pharmacy contact our office and allow 72 hours for refills to be completed.   ? ?Today you received the following chemotherapy and/or immunotherapy agents: Keytruda ?  ?To help prevent nausea and vomiting after your treatment, we encourage you to take your nausea medication as directed. ? ?BELOW ARE SYMPTOMS THAT SHOULD BE REPORTED IMMEDIATELY: ?*FEVER GREATER THAN 100.4 F (38 ?C) OR HIGHER ?*CHILLS OR SWEATING ?*NAUSEA AND VOMITING THAT IS NOT CONTROLLED WITH YOUR NAUSEA MEDICATION ?*UNUSUAL SHORTNESS OF BREATH ?*UNUSUAL BRUISING OR BLEEDING ?*URINARY PROBLEMS (pain or burning when urinating, or frequent urination) ?*BOWEL PROBLEMS (unusual diarrhea, constipation, pain near the anus) ?TENDERNESS IN MOUTH AND THROAT WITH OR WITHOUT PRESENCE OF ULCERS (sore throat, sores in mouth, or a toothache) ?UNUSUAL RASH, SWELLING OR PAIN  ?UNUSUAL VAGINAL DISCHARGE OR ITCHING  ? ?Items with * indicate a potential emergency and should be followed up as soon as possible or go to the Emergency Department if any problems should occur. ? ?Please show the CHEMOTHERAPY ALERT CARD or IMMUNOTHERAPY ALERT CARD at check-in to the  Emergency Department and triage nurse. ? ?Should you have questions after your visit or need to cancel or reschedule your appointment, please contact [facility]  Dept: [phone]  and follow the prompts.  Office hours are 8:00 a.m. to 4:30 p.m. Monday - Friday. Please note that voicemails left after 4:00 p.m. may not be returned until the following business day.  We are closed weekends and major holidays. You have access to a nurse at all times for urgent questions. Please call the main number to the clinic Dept: [phone] and follow the prompts. ? ? ?For any non-urgent questions, you may also contact your provider using MyChart. We now offer e-Visits for anyone 18 and older to request care online for non-urgent symptoms. For details visit [email or web address]. ?  ?Also download the MyChart app! Go to the app store, search "MyChart", open the app, select [place], and log in with your MyChart username and password. ? ?Due to Covid, a mask is required upon entering the hospital/clinic. If you do not have a mask, one will be given to you upon arrival. For doctor visits, patients may have 1 support person aged 18 or older with them. For treatment visits, patients cannot have anyone with them due to current Covid guidelines and our immunocompromised population.  ? ?

## 2021-08-27 NOTE — Telephone Encounter (Signed)
Called and given below message. She verbalized understanding. Rx sent to her preferred pharmacy. 

## 2021-08-27 NOTE — Telephone Encounter (Signed)
err

## 2021-08-30 ENCOUNTER — Other Ambulatory Visit: Payer: Self-pay

## 2021-08-30 DIAGNOSIS — M8580 Other specified disorders of bone density and structure, unspecified site: Secondary | ICD-10-CM | POA: Diagnosis not present

## 2021-08-30 DIAGNOSIS — N1831 Chronic kidney disease, stage 3a: Secondary | ICD-10-CM | POA: Diagnosis not present

## 2021-08-30 DIAGNOSIS — E1122 Type 2 diabetes mellitus with diabetic chronic kidney disease: Secondary | ICD-10-CM | POA: Diagnosis not present

## 2021-08-30 DIAGNOSIS — I10 Essential (primary) hypertension: Secondary | ICD-10-CM | POA: Diagnosis not present

## 2021-08-30 DIAGNOSIS — E785 Hyperlipidemia, unspecified: Secondary | ICD-10-CM | POA: Diagnosis not present

## 2021-08-30 MED ORDER — LIDOCAINE 5 % EX OINT: 1.0000 "application " | TOPICAL_OINTMENT | CUTANEOUS | 0 refills | Status: DC | PRN

## 2021-08-31 ENCOUNTER — Other Ambulatory Visit: Payer: Self-pay | Admitting: Hematology and Oncology

## 2021-09-10 ENCOUNTER — Other Ambulatory Visit: Payer: Self-pay

## 2021-09-14 ENCOUNTER — Other Ambulatory Visit: Payer: Self-pay

## 2021-09-14 DIAGNOSIS — C541 Malignant neoplasm of endometrium: Secondary | ICD-10-CM

## 2021-09-15 ENCOUNTER — Other Ambulatory Visit: Payer: Self-pay

## 2021-09-15 ENCOUNTER — Inpatient Hospital Stay: Payer: Medicare Other

## 2021-09-15 ENCOUNTER — Ambulatory Visit (HOSPITAL_COMMUNITY)
Admission: RE | Admit: 2021-09-15 | Discharge: 2021-09-15 | Disposition: A | Discharge status: Home / Self Care | Payer: Medicare Other | Source: Ambulatory Visit | Attending: Hematology and Oncology | Admitting: Hematology and Oncology

## 2021-09-15 DIAGNOSIS — C541 Malignant neoplasm of endometrium: Secondary | ICD-10-CM | POA: Insufficient documentation

## 2021-09-15 DIAGNOSIS — Z79899 Other long term (current) drug therapy: Secondary | ICD-10-CM | POA: Diagnosis not present

## 2021-09-15 DIAGNOSIS — K8020 Calculus of gallbladder without cholecystitis without obstruction: Secondary | ICD-10-CM | POA: Diagnosis not present

## 2021-09-15 DIAGNOSIS — R918 Other nonspecific abnormal finding of lung field: Secondary | ICD-10-CM | POA: Insufficient documentation

## 2021-09-15 DIAGNOSIS — Z5112 Encounter for antineoplastic immunotherapy: Secondary | ICD-10-CM | POA: Diagnosis not present

## 2021-09-15 DIAGNOSIS — I10 Essential (primary) hypertension: Secondary | ICD-10-CM

## 2021-09-15 DIAGNOSIS — D1800 Hemangioma unspecified site: Secondary | ICD-10-CM | POA: Diagnosis not present

## 2021-09-15 DIAGNOSIS — C773 Secondary and unspecified malignant neoplasm of axilla and upper limb lymph nodes: Secondary | ICD-10-CM

## 2021-09-15 DIAGNOSIS — I2510 Atherosclerotic heart disease of native coronary artery without angina pectoris: Secondary | ICD-10-CM | POA: Diagnosis not present

## 2021-09-15 DIAGNOSIS — K769 Liver disease, unspecified: Secondary | ICD-10-CM | POA: Diagnosis not present

## 2021-09-15 DIAGNOSIS — E039 Hypothyroidism, unspecified: Secondary | ICD-10-CM

## 2021-09-15 DIAGNOSIS — N183 Chronic kidney disease, stage 3 unspecified: Secondary | ICD-10-CM | POA: Diagnosis not present

## 2021-09-15 DIAGNOSIS — I700 Atherosclerosis of aorta: Secondary | ICD-10-CM | POA: Diagnosis not present

## 2021-09-15 LAB — COMPREHENSIVE METABOLIC PANEL
ALT: 18 U/L (ref 0–44)
AST: 17 U/L (ref 15–41)
Albumin: 4 g/dL (ref 3.5–5.0)
Alkaline Phosphatase: 61 U/L (ref 38–126)
Anion gap: 8 (ref 5–15)
BUN: 23 mg/dL (ref 8–23)
CO2: 24 mmol/L (ref 22–32)
Calcium: 9.2 mg/dL (ref 8.9–10.3)
Chloride: 111 mmol/L (ref 98–111)
Creatinine, Ser: 1.2 mg/dL — ABNORMAL HIGH (ref 0.44–1.00)
GFR, Estimated: 49 mL/min — ABNORMAL LOW (ref >60)
Glucose, Bld: 84 mg/dL (ref 70–99)
Potassium: 3.5 mmol/L (ref 3.5–5.1)
Sodium: 143 mmol/L (ref 135–145)
Total Bilirubin: 0.5 mg/dL (ref 0.3–1.2)
Total Protein: 6.5 g/dL (ref 6.5–8.1)

## 2021-09-15 LAB — CBC WITH DIFFERENTIAL (CANCER CENTER ONLY)
Abs Immature Granulocytes: 0.01 10*3/uL (ref 0.00–0.07)
Basophils Absolute: 0 10*3/uL (ref 0.0–0.1)
Basophils Relative: 0 %
Eosinophils Absolute: 0 10*3/uL (ref 0.0–0.5)
Eosinophils Relative: 0 %
HCT: 45.8 % (ref 36.0–46.0)
Hemoglobin: 15 g/dL (ref 12.0–15.0)
Immature Granulocytes: 0 %
Lymphocytes Relative: 32 %
Lymphs Abs: 1.5 10*3/uL (ref 0.7–4.0)
MCH: 29.2 pg (ref 26.0–34.0)
MCHC: 32.8 g/dL (ref 30.0–36.0)
MCV: 89.3 fL (ref 80.0–100.0)
Monocytes Absolute: 0.2 10*3/uL (ref 0.1–1.0)
Monocytes Relative: 5 %
Neutro Abs: 2.8 10*3/uL (ref 1.7–7.7)
Neutrophils Relative %: 63 %
Platelet Count: 173 10*3/uL (ref 150–400)
RBC: 5.13 MIL/uL — ABNORMAL HIGH (ref 3.87–5.11)
RDW: 15.6 % — ABNORMAL HIGH (ref 11.5–15.5)
WBC Count: 4.6 10*3/uL (ref 4.0–10.5)
nRBC: 0 % (ref 0.0–0.2)

## 2021-09-15 LAB — TOTAL PROTEIN, URINE DIPSTICK: Protein, ur: 30 mg/dL — AB

## 2021-09-15 LAB — TSH: TSH: 11.899 u[IU]/mL — ABNORMAL HIGH (ref 0.350–4.500)

## 2021-09-15 MED ORDER — HEPARIN SOD (PORK) LOCK FLUSH 100 UNIT/ML IV SOLN
INTRAVENOUS | Status: AC
  Filled 2021-09-15: qty 5

## 2021-09-15 MED ORDER — HEPARIN SOD (PORK) LOCK FLUSH 100 UNIT/ML IV SOLN
500.0000 [IU] | Freq: Once | INTRAVENOUS | Status: AC
  Administered 2021-09-15: 500 [IU] via INTRAVENOUS

## 2021-09-15 MED ORDER — IOHEXOL 300 MG/ML  SOLN
80.0000 mL | Freq: Once | INTRAMUSCULAR | Status: AC | PRN
  Administered 2021-09-15: 80 mL via INTRAVENOUS

## 2021-09-15 MED ORDER — LIDOCAINE-PRILOCAINE 2.5-2.5 % EX CREA: TOPICAL_CREAM | Freq: Every day | CUTANEOUS | 3 refills | Status: DC | PRN

## 2021-09-15 MED ORDER — HEPARIN SOD (PORK) LOCK FLUSH 100 UNIT/ML IV SOLN: 500.0000 [IU] | Freq: Once | INTRAVENOUS | Status: DC

## 2021-09-15 MED ORDER — SODIUM CHLORIDE (PF) 0.9 % IJ SOLN
INTRAMUSCULAR | Status: AC
  Filled 2021-09-15: qty 50

## 2021-09-15 MED ORDER — SODIUM CHLORIDE 0.9% FLUSH
10.0000 mL | Freq: Once | INTRAVENOUS | Status: AC
  Administered 2021-09-15: 10 mL

## 2021-09-17 ENCOUNTER — Inpatient Hospital Stay: Payer: Medicare Other

## 2021-09-17 ENCOUNTER — Other Ambulatory Visit: Payer: Self-pay

## 2021-09-17 ENCOUNTER — Inpatient Hospital Stay: Payer: Medicare Other | Attending: Gynecology | Admitting: Hematology and Oncology

## 2021-09-17 ENCOUNTER — Encounter: Payer: Self-pay | Admitting: Hematology and Oncology

## 2021-09-17 DIAGNOSIS — E039 Hypothyroidism, unspecified: Secondary | ICD-10-CM | POA: Diagnosis not present

## 2021-09-17 DIAGNOSIS — I10 Essential (primary) hypertension: Secondary | ICD-10-CM

## 2021-09-17 DIAGNOSIS — Z7189 Other specified counseling: Secondary | ICD-10-CM

## 2021-09-17 DIAGNOSIS — Z5112 Encounter for antineoplastic immunotherapy: Secondary | ICD-10-CM | POA: Diagnosis not present

## 2021-09-17 DIAGNOSIS — N183 Chronic kidney disease, stage 3 unspecified: Secondary | ICD-10-CM | POA: Insufficient documentation

## 2021-09-17 DIAGNOSIS — I129 Hypertensive chronic kidney disease with stage 1 through stage 4 chronic kidney disease, or unspecified chronic kidney disease: Secondary | ICD-10-CM | POA: Insufficient documentation

## 2021-09-17 DIAGNOSIS — C541 Malignant neoplasm of endometrium: Secondary | ICD-10-CM | POA: Insufficient documentation

## 2021-09-17 MED ORDER — SODIUM CHLORIDE 0.9 % IV SOLN: Freq: Once | INTRAVENOUS | Status: AC

## 2021-09-17 MED ORDER — HEPARIN SOD (PORK) LOCK FLUSH 100 UNIT/ML IV SOLN
500.0000 [IU] | Freq: Once | INTRAVENOUS | Status: AC | PRN
  Administered 2021-09-17: 500 [IU]

## 2021-09-17 MED ORDER — LEVOTHYROXINE SODIUM 200 MCG PO TABS: 200.0000 ug | ORAL_TABLET | Freq: Every day | ORAL | 3 refills | Status: DC

## 2021-09-17 MED ORDER — LEVOTHYROXINE SODIUM 50 MCG PO TABS: 50.0000 ug | ORAL_TABLET | Freq: Every day | ORAL | 3 refills | Status: DC

## 2021-09-17 MED ORDER — SODIUM CHLORIDE 0.9% FLUSH
10.0000 mL | INTRAVENOUS | Status: DC | PRN
  Administered 2021-09-17: 10 mL

## 2021-09-17 MED ORDER — SODIUM CHLORIDE 0.9 % IV SOLN
200.0000 mg | Freq: Once | INTRAVENOUS | Status: AC
  Administered 2021-09-17: 200 mg via INTRAVENOUS
  Filled 2021-09-17: qty 200

## 2021-09-17 NOTE — Patient Instructions (Signed)
[facility]  ° Discharge Instructions: °Thank you for choosing [facility] to provide your oncology and hematology care.  ° °If you have a lab appointment with the Cancer Center, please go directly to the Cancer Center and check in at the registration area. °  °Wear comfortable clothing and clothing appropriate for easy access to any Portacath or PICC line.  ° °We strive to give you quality time with your provider. You may need to reschedule your appointment if you arrive late (15 or more minutes).  Arriving late affects you and other patients whose appointments are after yours.  Also, if you miss three or more appointments without notifying the office, you may be dismissed from the clinic at the provider’s discretion.    °  °For prescription refill requests, have your pharmacy contact our office and allow 72 hours for refills to be completed.   ° °Today you received the following chemotherapy and/or immunotherapy agents: Pembrolizumab (Keytruda) °  °To help prevent nausea and vomiting after your treatment, we encourage you to take your nausea medication as directed. ° °BELOW ARE SYMPTOMS THAT SHOULD BE REPORTED IMMEDIATELY: °*FEVER GREATER THAN 100.4 F (38 °C) OR HIGHER °*CHILLS OR SWEATING °*NAUSEA AND VOMITING THAT IS NOT CONTROLLED WITH YOUR NAUSEA MEDICATION °*UNUSUAL SHORTNESS OF BREATH °*UNUSUAL BRUISING OR BLEEDING °*URINARY PROBLEMS (pain or burning when urinating, or frequent urination) °*BOWEL PROBLEMS (unusual diarrhea, constipation, pain near the anus) °TENDERNESS IN MOUTH AND THROAT WITH OR WITHOUT PRESENCE OF ULCERS (sore throat, sores in mouth, or a toothache) °UNUSUAL RASH, SWELLING OR PAIN  °UNUSUAL VAGINAL DISCHARGE OR ITCHING  ° °Items with * indicate a potential emergency and should be followed up as soon as possible or go to the Emergency Department if any problems should occur. ° °Please show the CHEMOTHERAPY ALERT CARD or IMMUNOTHERAPY ALERT CARD  at check-in to the Emergency Department and triage nurse. ° °Should you have questions after your visit or need to cancel or reschedule your appointment, please contact [facility]  Dept: [phone]  and follow the prompts.  Office hours are 8:00 a.m. to 4:30 p.m. Monday - Friday. Please note that voicemails left after 4:00 p.m. may not be returned until the following business day.  We are closed weekends and major holidays. You have access to a nurse at all times for urgent questions. Please call the main number to the clinic Dept: [phone] and follow the prompts. ° ° °For any non-urgent questions, you may also contact your provider using MyChart. We now offer e-Visits for anyone 18 and older to request care online for non-urgent symptoms. For details visit [email or web address]. °  °Also download the MyChart app! Go to the app store, search "MyChart", open the app, select [facility], and log in with your MyChart username and password. ° °Due to Covid, a mask is required upon entering the hospital/clinic. If you do not have a mask, one will be given to you upon arrival. For doctor visits, patients may have 1 support person aged 18 or older with them. For treatment visits, patients cannot have anyone with them due to current Covid guidelines and our immunocompromised population.  ° °

## 2021-09-17 NOTE — Progress Notes (Signed)
[facility] OFF[facility] PROGRESS NOTE  Patient Care Team: Jonathon Jordan, MD as PCP - General (Family Medicine) Awanda Mink Craige Cotta, RN as Registered Nurse (Oncology)  ASSESSMENT & PLAN:  Endomet[facility] cancer [facility] I have reviewed multiple imaging studies with the patient The persistent lymphadenopathy in the right axilla is stable Overall, while she does not have complete response, combination of pembrolizumab and lenvatinib is controlling her disease She will continue this treatment indefinitely I plan to repeat imaging study again in December  Essential hypertension She has poorly controlled hypertension Her medication has been adjusted many times over the past few years Rather than going up on her medication, I advised her on dietary modification and lifestyle changes  Chronic kidney disease (CKD), stage III (moderate) (HCC) Recent renal function fluctuates up and down We discussed the importance of aggressive risk factor modification and to drink plenty of oral fluids  Acquired hypothyroidism TSH is intermittently elevated, could be related to side effects of checkpoint inhibitors The dose of her Synthroid was recently modified We will continue to monitor that carefully  No orders of the defined types were placed in this encounter.   All questions were answered. The patient knows to call the clinic with any problems, questions or concerns. The total time spent in the appointment was 30 minutes encounter with patients including review of chart and various tests results, discussions about plan of care and coordination of care plan   [name], MD 09/17/2021 9:18 AM  INTERVAL HISTORY: Please see below for problem oriented charting. 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 According to the patient, her documented blood pressure from home is typically high Systolic blood pressure is between 1  40-150 She denies any signs or symptoms of hypertension such as blurriness of vision She have occasional nausea and bloating She eats a lot of fruit and vegetables and admits she is not eating enough protein  REVIEW OF SYSTEMS:   Constitutional: Denies fevers, chills or abnormal weight loss Eyes: Denies blurriness of vision Ears, nose, mouth, throat, and face: Denies mucositis or sore throat Respiratory: Denies cough, dyspnea or wheezes Cardiovascular: Denies palpitation, chest discomfort or lower extremity swelling Skin: Denies abnormal skin rashes Lymphatics: Denies new lymphadenopathy or easy bruising Neurological:Denies numbness, tingling or new weaknesses Behavioral/Psych: Mood is stable, no new changes  All other systems were reviewed with the patient and are negative.  I have reviewed the past medical history, past surgical history, social history and family history with the patient and they are unchanged from previous note.  ALLERGIES:  is allergic to sulfa antibiotics and sulfamethoxazole.  MEDICATIONS:  Current Outpatient Medications  Medication Sig Dispense Refill   amLODipine-valsartan (EXFORGE) 5-160 MG tablet Take 1 tablet by mouth daily.     atenolol (TENORMIN) 25 MG tablet Take 1 tablet by mouth once daily 30 tablet 0   Azelastine HCl 137 MCG/SPRAY SOLN Place into both nostrils.     Calcium Carb-Cholecalciferol 600-800 MG-UNIT TABS Take 1 tablet by mouth daily.     calcium carbonate (TUMS - DOSED IN MG ELEMENTAL CALCIUM) 500 MG chewable tablet Chew 2 tablets by mouth 2 (two) times daily. (Patient not taking: Reported on 08/27/2021)     ergocalciferol (VITAMIN D2) 1.25 MG (50000 UT) capsule Take 1 capsule (50,000 Units total) by mouth 2 (two) times a week. 25 capsule 1   glipiZIDE (GLUCOTROL) 5 MG tablet Take by mouth. (Patient not taking: Reported on 08/27/2021)  glucose blood (ONETOUCH VERIO) test strip Use as instructed to check blood sugars 3 times a day. 100 each 3    lenvatinib 10 mg daily dose (LENVIMA, 10 MG DAILY DOSE,) capsule Take 1 capsule (10 mg total) by mouth daily. 30 capsule 11   levothyroxine (EUTHYROX) 200 MCG tablet Take 1 tablet (200 mcg total) by mouth daily before breakfast. 90 tablet 3   levothyroxine (EUTHYROX) 50 MCG tablet Take 1 tablet (50 mcg total) by mouth daily before breakfast. 90 tablet 3   lidocaine-prilocaine (EMLA) cream Apply topically daily as needed. 30 g 3   OneTouch Delica Lancets 01E MISC 1 each by Other route 2 (two) times daily. Use to monitor glucose levels BID; E11.65 100 each 2   RELION PEN NEEDLES 32G X 4 MM MISC USE 1 PEN PER DAY (Patient not taking: Reported on 08/27/2021) 50 each 5   simvastatin (ZOCOR) 10 MG tablet Take 10 mg by mouth at bedtime.      No current facility-administered medications for this visit.   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 Leo Grosser who obtained an endometrial biopsy showing poorly differentiated endometrial carcinoma and negative endocervical curettage    12/12/2017 Imaging   MAMMOGRAM FINDINGS: In the right axilla, a possible mass warrants further evaluation. In the left breast, no findings suspicious for malignancy.   Images were processed with CAD.    IMPRESSION: Further evaluation is suggested for possible mass in the right axilla.      12/24/2017 Imaging   Ct scan chest, abdomen and pelvis 1. Marked thickening of the endometrium (42 mm) compatible with known primary endometrial malignancy. No evidence of extrauterine invasion. 2. No pelvic or retroperitoneal adenopathy. 3. Right middle lobe 4 mm solid pulmonary nodule, for which follow-up chest CT is advised in 3-6 months. 4. Several findings that are equivocal for distant metastatic disease. Vaguely nodular heterogeneous hyperenhancement in the peripheral right liver lobe, which could represent benign transient perfusional phenomena, with underlying liver lesions not entirely excluded. Mildly sclerotic T12 vertebral lesion. Mildly enlarged right axillary lymph node. The best single test to further evaluate these findings would be a PET-CT. Alternative tests include bone scan or thoracic MRI without and with IV contrast for the T12 osseous lesion, MRI abdomen without and with IV contrast for the liver findings, and diagnostic mammographic evaluation for the right axillary node. 5.  Aortic Atherosclerosis (ICD10-I70.0).      01/09/2018 PET scan   1. Moderate hypermetabolism corresponding to enlarging axillary node since 12/22/2017. Highly suspicious for an atypical distribution of metastatic disease. 2. No hypermetabolism to suggest hepatic or T12 osseous metastasis. 3. Hypermetabolic endometrial primary.    01/23/2018 Initial Diagnosis   Endometrial cancer ([place])  01/23/2018 Pathology Results   1. Lymph node, sentinel, biopsy, left external iliac - NO CARCINOMA IDENTIFIED IN ONE LYMPH NODE (0/1) - SEE COMMENT 2. Lymph nodes, regional resection, right para aortic - NO CARCINOMA IDENTIFIED IN FOUR LYMPH NODES (0/4) - SEE COMMENT 3. Lymph nodes, regional resection, right pelvic - NO CARCINOMA IDENTIFIED IN EIGHT LYMPH NODES (0/8) - SEE COMMENT 4. Cul-de-sac biopsy - METASTATIC  CARCINOMA 5. Uterus +/- tubes/ovaries, neoplastic, cervix, bilateral fallopian tubes and ovaries UTERUS: - MIXED SEROUS AND ENDOMETRIOID CARCINOMA - SEROSAL IMPLANTS PRESENT - LYMPHOVASCULAR SPACE INVASION PRESENT - LEIOMYOMATA (1.5 CM; LARGEST) - SEE ONCOLOGY TABLE AND COMMENT BELOW CERVIX: - BENIGN NABOTHIAN CYSTS - NO CARCINOMA IDENTIFIED BILATERAL OVARIES: - METASTATIC CARCINOMA PRESENT ON OVARIAN SURFACE BILATERAL FALLOPIAN TUBES: - INTRALUMINAL CARCINOMA Microscopic Comment 1. -3. Cytokeratin AE1/3 was performed on the sentinel lymph nodes to exclude micrometastasis. There is no evidence of metastatic carcinoma by immunohistochemistry. 5. 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%) Histologic Grade: N/A Myometrial invasion: Estimated less than 50% myometrial invasion (0.3 cm of myometrium involved; 1.4 cm measured thickness) Uterine Serosa Involvement: Present Cervical stromal involvement: Not identified Extent of involvement of other organs: - Fallopian tube (left within the lumen) - Ovary, left (surface involvement) - Cul-de-sac Lymphovascular invasion: Present Regional Lymph Nodes: Examined: 1 Sentinel 12 Non-sentinel 13 Total Lymph nodes with metastasis: 0 Isolated tumor cells (< 0.2 mm): 0 Micrometastasis: (> 0.2 mm and < 2.0 mm): 0 Macrometastasis: (> 2.0 mm): 0 Extracapsular extension: N/A Tumor block for ancillary studies: 5E, 5B MMR / MSI testing: Pending will be reported separately Pathologic Stage Classification (pTNM, AJCC 8th edition): pT3a, pN0 FIGO Stage: IIIA COMMENT: There is tumor present on the surface of the left ovary and within the lumen of the left fallopian tube. The carcinoma appears to be mixed with the largest  component being high grade serous carcinoma. Dr. Lyndon Code reviewed the case and agrees with the above diagnosis.    01/23/2018 Surgery   Surgeon: Donaciano Eva     Operation: Robotic-assisted laparoscopic total hysterectomy with bilateral salpingoophorectomy, SLN injection, mapping and biopsy, right pelvic and para-aortic lymphadenectomy   Operative Findings:  : 10-12cm bulky uterus with frank serosal involvement on posterior cul de sac peritoneum and anterior peritoneum which adhesed the bladder to the anterior uterus. Unilateral mapping on left pelvis. No grossly suspicious nodes. Normal omentum and diaphragms.     02/01/2018 Pathology Results   Lymph node, needle/core biopsy, right axilla - METASTATIC CARCINOMA - SEE COMMENT Microscopic Comment The neoplastic cells are positive for cytokeratin 7 and Pax-8 but negative for cytokeratin 5/6, cytokeratin 20, Gata-3, p63, p53 and GCDFP. Overall, the immunoprofile is consistent with metastasis from the patient's known gynecologic carcinoma.    02/01/2018 Procedure   Ultrasound-guided core biopsies of a suspicious right axillary lymph node.    02/21/2018 - 06/13/2018 Chemotherapy   The patient had carboplatin & Taxol x 6    03/26/2018 - 04/30/2018 Radiation Therapy   Radiation treatment dates:   03/26/18, 04/02/18, 04/19/18, 04/23/18 04/30/18   Site/dose: proximal Vagina, 6 Gy in 5 fractions for a total dose of 30 Gy      04/26/2018 PET scan   1. Interval resection of the hypermetabolic endometrial primary. No discernible hypermetabolic pelvic sidewall or peritoneal metastases. 2. Stable hypermetabolic bilateral axillary lymphadenopathy. The degree of  hypermetabolism in these lymph nodes remains highly suspicious for neoplasm. 3. Interval development of low level FDG uptake in 2 stable, small lymph nodes in the left groin region. This may be reactive, but close attention recommended to exclude metastatic involvement. 4. Stable non  hypermetabolic low-density liver lesions and the mixed lucent and sclerotic T12 lesion is stable without hypermetabolism today.    07/09/2018 Imaging   Status post hysterectomy.   Mild axillary lymphadenopathy, improved. Nodal metastases not excluded.   Irregular wall thickening involving the anterior bladder, possibly reflecting radiation cystitis versus tumor.   Additional stable findings as above, including a 5 mm right middle lobe nodule and stable sclerosis involving the T12 vertebral body.    10/10/2018 Imaging   1. Stable exam. No findings within the abdomen or pelvis to suggest metastatic disease. 2. Unchanged appearance of sclerosis involving the T12 vertebra. 3.  Aortic Atherosclerosis (ICD10-I70.0).   10/10/2018 Imaging   Ct abdomen and pelvis 1. Stable exam. No findings within the abdomen or pelvis to suggest metastatic disease. 2. Unchanged appearance of sclerosis involving the T12 vertebra. 3.  Aortic Atherosclerosis (ICD10-I70.0).   01/16/2019 PET scan   1. Enlargement and increased activity in the left axillary, right axillary, and left inguinal adenopathy compatible with progressive malignancy. 2. Stable 4 mm right middle lobe subpleural nodule, not appreciably hypermetabolic. 3. Other imaging findings of potential clinical significance: Right kidney lower pole cyst. Aortic Atherosclerosis (ICD10-I70.0). Prominent stool throughout the colon favors constipation. Mild chronic left maxillary sinusitis.     02/01/2019 -  Chemotherapy   The patient had pembrolizumab and Lenvima for chemotherapy treatment.     04/25/2019 Imaging   Ct imaging 1. Interval response to therapy. Decrease in size of bilateral axillary and left inguinal lymph nodes. No new or progressive findings identified. 2. Stable sclerotic appearance of the T12 vertebra. 3. Aortic atherosclerosis. Lad coronary artery calcification noted.   08/30/2019 Imaging   1. Bulky RIGHT hilar lymph node, slightly  increased in size from previous imaging. 2. Multiple foci of hyperenhancement in the liver. 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 Consider MRI liver on follow-up to establish a baseline for number of lesions as these will appear variable on CT follow-up based on phase of contrast acquisition in the future. 3. Stable 5 mm nodule adjacent to the fissure, minor fissure in the RIGHT middle lobe.     11/28/2019 Imaging   1. Stable exam. No new or progressive findings. 2. Stable enlarged right axillary lymph node. 3. Stable tiny bilateral pulmonary nodules. Continued attention on follow-up recommended. 4. Scattered hypodensities in the liver parenchyma correspond to the hypervascular lesions seen on the previous study performed with intravenous contrast material. 5. Right renal cyst. 6. Aortic Atherosclerosis (ICD10-I70.0).     03/16/2020 Imaging   Increased echogenicity of renal parenchyma is noted bilaterally suggesting medical renal disease. No hydronephrosis or renal obstruction is noted   04/02/2020 Imaging   1. Right axillary lymphadenopathy is stable. Tiny bilateral pulmonary nodules are stable. Subcentimeter low-attenuation liver lesions are stable. 2. No new or progressive metastatic disease in the chest, abdomen or pelvis. 3. Aortic Atherosclerosis (ICD10-I70.0).   09/15/2020 Imaging   1. Stable RIGHT axillary lymph node with enlargement measuring 2 cm short axis. 2. Stable small nodule along the  minor fissure in the RIGHT chest. 3. No new or progressive findings. 4. Aortic atherosclerosis.   03/24/2021 Imaging   1. Right axillary lymphadenopathy is stable compared to prior studies. 2. No other definite signs of metastatic disease elsewhere in the thorax. 3. Hepatic steatosis.  Numerous hypervascular areas noted in the liver, incompletely characterized on today's arterial phase examination. These are similar to remote prior examination from 08/30/2019, likely to represent small benign lesions such as flash fill cavernous hemangiomas. These could be definitively evaluated with nonemergent abdominal MRI with and without IV gadolinium if of clinical concern. 4. Aortic atherosclerosis, in addition to left main and left anterior descending coronary artery disease. Assessment for potential risk factor modification, dietary therapy or pharmacologic therapy may be warranted, if clinically indicated.   Aortic Atherosclerosis (ICD10-I70.0).   09/16/2021 Imaging   1. Unchanged enlarged right axillary lymph node. No evidence of new lymphadenopathy or metastatic disease in the chest, abdomen, or pelvis. 2. Subcentimeter hyperenhancing lesions of the right lobe of the liver, hepatic segments VII and VIII are unchanged, measuring up to 0.7 cm. These are likely small benign incidental flash filling hemangiomata although incompletely characterized on this examination. Attention on follow-up. 3. Status post hysterectomy. 4. Cholelithiasis. 5. Coronary artery disease.   Aortic Atherosclerosis (ICD10-I70.0).       PHYSICAL EXAMINATION: ECOG PERFORMANCE STATUS: 1 - Symptomatic but completely ambulatory  Vitals:   09/17/21 0815  BP: (!) 164/88  Pulse: 74  Resp: 18  Temp: 97.7 F (36.5 C)  SpO2: 100%   Filed Weights   09/17/21 0815  Weight: 154 lb 12.8 oz (70.2 kg)    GENERAL:alert, no distress and comfortable NEURO: alert & oriented x 3 with fluent speech, no focal motor/sensory deficits  LABORATORY DATA:  I have reviewed the data as listed    Component Value Date/Time   NA 143 09/15/2021 0933   K 3.5 09/15/2021 0933   CL 111 09/15/2021 0933   CO2 24 09/15/2021 0933   GLUCOSE 84 09/15/2021 0933   BUN 23 09/15/2021 0933   CREATININE 1.20 (H) 09/15/2021 0933    CREATININE 1.75 (H) 01/10/2020 1200   CALCIUM 9.2 09/15/2021 0933   PROT 6.5 09/15/2021 0933   ALBUMIN 4.0 09/15/2021 0933   AST 17 09/15/2021 0933   AST 13 (L) 01/10/2020 1200   ALT 18 09/15/2021 0933   ALT 11 01/10/2020 1200   ALKPHOS 61 09/15/2021 0933   BILITOT 0.5 09/15/2021 0933   BILITOT 0.4 01/10/2020 1200   GFRNONAA 49 (L) 09/15/2021 0933   GFRNONAA 29 (L) 01/10/2020 1200   GFRAA 34 (L) 01/10/2020 1200    No results found for: SPEP, UPEP  Lab Results  Component Value Date   WBC 4.6 09/15/2021   NEUTROABS 2.8 09/15/2021   HGB 15.0 09/15/2021   HCT 45.8 09/15/2021   MCV 89.3 09/15/2021   PLT 173 09/15/2021      Chemistry      Component Value Date/Time   NA 143 09/15/2021 0933   K 3.5 09/15/2021 0933   CL 111 09/15/2021 0933   CO2 24 09/15/2021 0933   BUN 23 09/15/2021 0933   CREATININE 1.20 (H) 09/15/2021 0933   CREATININE 1.75 (H) 01/10/2020 1200      Component Value Date/Time   CALCIUM 9.2 09/15/2021 0933   ALKPHOS 61 09/15/2021 0933   AST 17 09/15/2021 0933   AST 13 (L) 01/10/2020 1200   ALT 18 09/15/2021 0933   ALT 11 01/10/2020 1200  BILITOT 0.5 09/15/2021 0933   BILITOT 0.4 01/10/2020 1200       RADIOGRAPHIC STUDIES: I have reviewed multiple imaging studies with the patient I have personally reviewed the radiological images as listed and agreed with the findings in the report. CT CHEST ABDOMEN PELVIS W CONTRAST  Result Date: 09/15/2021 CLINICAL DATA:  Endometrial cancer restaging, chemotherapy and XRT complete, ongoing immunotherapy and oral chemotherapy * Tracking Code: BO * EXAM: CT CHEST, ABDOMEN, AND PELVIS WITH CONTRAST TECHNIQUE: Multidetector CT imaging of the chest, abdomen and pelvis was performed following the standard protocol during bolus administration of intravenous contrast. RADIATION DOSE REDUCTION: This exam was performed according to the departmental dose-optimization program which includes automated exposure control, adjustment  of the mA and/or kV according to patient size and/or use of iterative reconstruction technique. CONTRAST:  72m OMNIPAQUE IOHEXOL 300 MG/ML SOLN, additional oral enteric contrast COMPARISON:  CT chest, 03/23/2021, CT chest abdomen pelvis, 04/02/2020 FINDINGS: CT CHEST FINDINGS Cardiovascular: Right chest port catheter. Normal heart size. Left coronary artery calcifications. No pericardial effusion. Mediastinum/Nodes: Unchanged enlarged right axillary lymph node measuring 2.2 x 2.0 cm (series 2, image 16). No new or additional enlarged mediastinal, hilar, or axillary lymph nodes. Thyroid gland, trachea, and esophagus demonstrate no significant findings. Lungs/Pleura: Unchanged 0.3 cm fissural lymph node of the lateral segment right middle lobe, benign (series 4, image 67). Unchanged 0.5 cm ground-glass nodule of the left upper lobe (series 4, image 38). These nodules are incidental and benign, no specific further follow-up required. No pleural effusion or pneumothorax. Musculoskeletal: No chest wall mass or suspicious osseous lesions identified. Stable, benign sclerotic vertebral body hemangioma of T12. CT ABDOMEN PELVIS FINDINGS Hepatobiliary: Subcentimeter hyperenhancing lesions of the right lobe of the liver, hepatic segments VII and VIII are unchanged, measuring up to 0.7 cm (series 2, image 51). Small gallstones. Gallbladder wall thickening, or biliary dilatation. Pancreas: Unremarkable. No pancreatic ductal dilatation or surrounding inflammatory changes. Spleen: Normal in size without significant abnormality. Adrenals/Urinary Tract: Adrenal glands are unremarkable. Simple, benign exophytic cyst of the inferior pole of the right kidney, for which no further follow-up or characterization is required. Kidneys are otherwise normal, without renal calculi, solid lesion, or hydronephrosis. Bladder is unremarkable. Stomach/Bowel: Stomach is within normal limits. Appendix appears normal. No evidence of bowel wall  thickening, distention, or inflammatory changes. Large burden of stool throughout the colon and rectum. Vascular/Lymphatic: Aortic atherosclerosis. No enlarged abdominal or pelvic lymph nodes. Reproductive: Status post hysterectomy. Other: No abdominal wall hernia or abnormality. No ascites. Musculoskeletal: No acute osseous findings. IMPRESSION: 1. Unchanged enlarged right axillary lymph node. No evidence of new lymphadenopathy or metastatic disease in the chest, abdomen, or pelvis. 2. Subcentimeter hyperenhancing lesions of the right lobe of the liver, hepatic segments VII and VIII are unchanged, measuring up to 0.7 cm. These are likely small benign incidental flash filling hemangiomata although incompletely characterized on this examination. Attention on follow-up. 3. Status post hysterectomy. 4. Cholelithiasis. 5. Coronary artery disease. Aortic Atherosclerosis (ICD10-I70.0). Electronically Signed   By: ADelanna AhmadiM.D.   On: 09/15/2021 19:53

## 2021-09-17 NOTE — Assessment & Plan Note (Signed)
She has poorly controlled hypertension Her medication has been adjusted many times over the past few years Rather than going up on her medication, I advised her on dietary modification and lifestyle changes

## 2021-09-17 NOTE — Assessment & Plan Note (Signed)
I have reviewed multiple imaging studies with the patient The persistent lymphadenopathy in the right axilla is stable Overall, while she does not have complete response, combination of pembrolizumab and lenvatinib is controlling her disease She will continue this treatment indefinitely I plan to repeat imaging study again in December

## 2021-09-17 NOTE — Assessment & Plan Note (Signed)
Recent renal function fluctuates up and down We discussed the importance of aggressive risk factor modification and to drink plenty of oral fluids 

## 2021-09-17 NOTE — Assessment & Plan Note (Signed)
TSH is intermittently elevated, could be related to side effects of checkpoint inhibitors The dose of her Synthroid was recently modified We will continue to monitor that carefully 

## 2021-09-30 ENCOUNTER — Other Ambulatory Visit: Payer: Self-pay | Admitting: Hematology and Oncology

## 2021-10-06 ENCOUNTER — Ambulatory Visit (INDEPENDENT_AMBULATORY_CARE_PROVIDER_SITE_OTHER): Payer: Medicare Other | Admitting: Endocrinology

## 2021-10-06 ENCOUNTER — Encounter: Payer: Self-pay | Admitting: Endocrinology

## 2021-10-06 VITALS — BP 142/84 | HR 77 | Ht 66.0 in | Wt 150.4 lb

## 2021-10-06 DIAGNOSIS — E559 Vitamin D deficiency, unspecified: Secondary | ICD-10-CM

## 2021-10-06 DIAGNOSIS — E1122 Type 2 diabetes mellitus with diabetic chronic kidney disease: Secondary | ICD-10-CM | POA: Diagnosis not present

## 2021-10-06 DIAGNOSIS — N1831 Chronic kidney disease, stage 3a: Secondary | ICD-10-CM | POA: Diagnosis not present

## 2021-10-06 DIAGNOSIS — I10 Essential (primary) hypertension: Secondary | ICD-10-CM | POA: Diagnosis not present

## 2021-10-06 DIAGNOSIS — Z794 Long term (current) use of insulin: Secondary | ICD-10-CM | POA: Diagnosis not present

## 2021-10-06 DIAGNOSIS — E063 Autoimmune thyroiditis: Secondary | ICD-10-CM | POA: Diagnosis not present

## 2021-10-06 LAB — POCT GLYCOSYLATED HEMOGLOBIN (HGB A1C): Hemoglobin A1C: 5.8 % — AB (ref 4.0–5.6)

## 2021-10-06 LAB — VITAMIN D 25 HYDROXY (VIT D DEFICIENCY, FRACTURES): VITD: 21.04 ng/mL — ABNORMAL LOW (ref 30.00–100.00)

## 2021-10-06 NOTE — Progress Notes (Signed)
Patient ID: Krista Johnson, female   DOB: [birth year], 72 y.o.   MRN: [id number]           Reason for Appointment: Type II Diabetes follow-up    History of Present Illness   Diagnosis date: 2004  Previous history:   Oral hypoglycemic drugs previously used are: Metformin, glipizide, Trulicity Not clear why metformin was stopped in 2019, may have been when insulin was started Insulin was started in 2019 and subsequently stopped in 2022 when she lost weight  A1c range in the last few years is: 5.8-14.9  Recent history:     Non-insulin hypoglycemic drugs: None     Insulin regimen: None            Side effects from medications: None  Current self management, blood sugar patterns and problems identified:  A1c is continuing to be well controlled and now 5.8 She has excellent blood sugars at home and checking up to 3 times a day In the past she had lost significant amount of weight from walking up to 4 miles a day and a better diet, with this she had stopped insulin Lab glucose was 84 and last month 88  Exercise: 45 min, less now  Diet management: She is usually watching her calories and high fat meals      Monitors blood glucose: Once a day.    Glucometer: One Touch.           Blood Glucose readings from meter review: Range 78-137 at different times, mostly before meals at AVERAGE 95    Weight control: 250  Wt Readings from Last 3 Encounters:  10/06/21 150 lb 6.4 oz (68.2 kg)  09/17/21 154 lb 12.8 oz (70.2 kg)  08/27/21 152 lb (68.9 kg)            Diabetes labs:  Lab Results  Component Value Date   HGBA1C 5.8 (A) 10/06/2021   HGBA1C 6.2 (A) 05/27/2021   HGBA1C 5.8 (A) 01/21/2021   Lab Results  Component Value Date   LDLCALC 89 01/01/2021   CREATININE 1.20 (H) 09/15/2021     Allergies as of 10/06/2021       Reactions   Sulfa Antibiotics Nausea Only   Sulfamethoxazole Nausea Only        Medication List        Accurate as of October 06, 2021 11:59 PM.  If you have any questions, ask your nurse or doctor.          amLODipine-valsartan 5-160 MG tablet Commonly known as: EXFORGE Take 1 tablet by mouth daily.   atenolol 25 MG tablet Commonly known as: TENORMIN Take 1 tablet by mouth once daily   Azelastine HCl 137 MCG/SPRAY Soln Place into both nostrils.   Calcium Carb-Cholecalciferol 600-800 MG-UNIT Tabs Take 1 tablet by mouth daily.   calcium carbonate 500 MG chewable tablet Commonly known as: TUMS - dosed in mg elemental calcium Chew 2 tablets by mouth 2 (two) times daily.   ergocalciferol 1.25 MG (50000 UT) capsule Commonly known as: VITAMIN D2 Take 1 capsule (50,000 Units total) by mouth 2 (two) times a week.   glipiZIDE 5 MG tablet Commonly known as: GLUCOTROL Take by mouth.   Lenvima (10 MG Daily Dose) capsule Generic drug: lenvatinib 10 mg daily dose Take 1 capsule (10 mg total) by mouth daily.   levothyroxine 200 MCG tablet Commonly known as: Euthyrox Take 1 tablet (200 mcg total) by mouth daily before breakfast.   levothyroxine 50 MCG  tablet Commonly known as: Euthyrox Take 1 tablet (50 mcg total) by mouth daily before breakfast.   lidocaine-prilocaine cream Commonly known as: EMLA Apply topically daily as needed.   OneTouch Delica Lancets 43P Misc 1 each by Other route 2 (two) times daily. Use to monitor glucose levels BID; E11.65   OneTouch Verio test strip Generic drug: glucose blood Use as instructed to check blood sugars 3 times a day.   ReliOn Pen Needles 32G X 4 MM Misc Generic drug: Insulin Pen Needle USE 1 PEN PER DAY   simvastatin 10 MG tablet Commonly known as: ZOCOR Take 10 mg by mouth at bedtime.        Allergies:  Allergies  Allergen Reactions   Sulfa Antibiotics Nausea Only   Sulfamethoxazole Nausea Only    Past Medical History:  Diagnosis Date   Arthritis    right knee--- last cortisone injection 04/ 2019   CKD (chronic kidney disease)    Diabetes mellitus without  complication [facility])    endocrinologist-  dr Loanne Drilling   Endometrial cancer [facility])    History of colon polyps    Hyperlipidemia    Hyperparathyroidism ([place])    Hypertension     Past Surgical History:  Procedure Laterality Date   COLONOSCOPY  last one 12-25-2017   IR IMAGING GUIDED PORT INSERTION  02/20/2018   ROBOTIC ASSISTED TOTAL HYSTERECTOMY WITH BILATERAL SALPINGO OOPHERECTOMY N/A 01/23/2018   Procedure: XI ROBOTIC ASSISTED TOTAL HYSTERECTOMY WITH BILATERAL SALPINGO OOPHORECTOMY;  Surgeon: Everitt Amber, MD;  Location: [facility];  Service: Gynecology;  Laterality: N/A;   SENTINEL NODE BIOPSY N/A 01/23/2018   Procedure: SENTINEL NODE BIOPSY;  Surgeon: Everitt Amber, MD;  Location: [facility];  Service: Gynecology;  Laterality: N/A;    Family History  Problem Relation Age of Onset   Diabetes Mother    Hypertension Mother    Diabetes Sister    Hypertension Sister    Diabetes Maternal Uncle    Colon cancer Paternal Aunt    Diabetes Paternal Aunt    Stomach cancer Neg Hx    Rectal cancer Neg Hx    Esophageal cancer Neg Hx    Colon polyps Neg Hx     Social History:  reports that she has never smoked. She has never used smokeless tobacco. She reports that she does not currently use alcohol. She reports that she does not use drugs.  Review of Systems:  Hypertension:   Treatment includes valsartan, taking combination with amlodipine from PCP  BP Readings from Last 3 Encounters:  10/06/21 (!) 142/84  09/17/21 (!) 164/88  08/27/21 (!) 175/94    Lipids: Managed by PCP with 10 mg Zocor    Lab Results  Component Value Date   CHOL 153 01/01/2021   CHOL 150 01/21/2020   Lab Results  Component Value Date   HDL 42 01/01/2021   HDL 36.50 (L) 01/21/2020   Lab Results  Component Value Date   LDLCALC 89 01/01/2021   LDLCALC 83 01/21/2020   Lab Results  Component Value Date   TRIG 108 01/01/2021   TRIG 154.0 (H) 01/21/2020   Lab Results  Component Value Date   CHOLHDL 3.6 01/01/2021    CHOLHDL 4 01/21/2020   No results found for: "LDLDIRECT"   She has been treated for HYPOTHYROIDISM since about 2020  Her prescriptions have been done by her oncologist and is taking 250 micrograms levothyroxine now Does not complain of feeling tired  She does take Tums for calcium but not  in the morning with her levothyroxine    Lab Results  Component Value Date   TSH 11.899 (H) 09/15/2021   TSH 16.524 (H) 08/27/2021   TSH 10.709 (H) 06/25/2021   Lab Results  Component Value Date   CALCIUM 9.2 09/15/2021   VITAMIN D deficiency:: She is getting vitamin D 2, 50,000 units twice per week Usually takes this with breakfast and most of the time may have some meat to eat at breakfast time  Her levels are tending to be consistently low     Examination:   BP (!) 142/84   Pulse 77   Ht '5\' 6"'$  (1.676 m)   Wt 150 lb 6.4 oz (68.2 kg)   SpO2 99%   BMI 24.28 kg/m   Body mass index is 24.28 kg/m.    ASSESSMENT/ PLAN:    Diabetes type 2:   Current treatment: None  A1c is normal at 5.8 with fairly normal blood sugars at home and labs also She is doing very well with diet and exercise regimen and maintaining her weight loss To continue treatment with lifestyle changes only and no medications required Continue metformin if blood sugars start going up especially fasting  Vitamin D deficiency: Levels have been low despite taking high-dose vitamin D2 and may benefit from trying D3 Also not clear if she may be having a vitamin D binding protein deficiency given her ethnic origin  No recurrence of hypercalcemia  Hypertension: To follow-up with PCP  Hypothyroidism: Currently being managed by oncologist  Follow-up as needed  There are no Patient Instructions on file for this visit.   Elayne Snare 10/07/2021, 12:40 PM

## 2021-10-08 ENCOUNTER — Other Ambulatory Visit: Payer: Self-pay

## 2021-10-08 ENCOUNTER — Inpatient Hospital Stay: Payer: Medicare Other

## 2021-10-08 ENCOUNTER — Encounter: Payer: Self-pay | Admitting: Hematology and Oncology

## 2021-10-08 ENCOUNTER — Inpatient Hospital Stay (HOSPITAL_BASED_OUTPATIENT_CLINIC_OR_DEPARTMENT_OTHER): Payer: Medicare Other | Admitting: Hematology and Oncology

## 2021-10-08 VITALS — BP 158/82 | HR 70 | Resp 18

## 2021-10-08 VITALS — BP 168/95 | HR 75 | Temp 97.4°F | Resp 18 | Ht 66.0 in | Wt 150.8 lb

## 2021-10-08 DIAGNOSIS — N183 Chronic kidney disease, stage 3 unspecified: Secondary | ICD-10-CM | POA: Diagnosis not present

## 2021-10-08 DIAGNOSIS — E559 Vitamin D deficiency, unspecified: Secondary | ICD-10-CM

## 2021-10-08 DIAGNOSIS — C773 Secondary and unspecified malignant neoplasm of axilla and upper limb lymph nodes: Secondary | ICD-10-CM

## 2021-10-08 DIAGNOSIS — I10 Essential (primary) hypertension: Secondary | ICD-10-CM | POA: Diagnosis not present

## 2021-10-08 DIAGNOSIS — Z7189 Other specified counseling: Secondary | ICD-10-CM

## 2021-10-08 DIAGNOSIS — C541 Malignant neoplasm of endometrium: Secondary | ICD-10-CM | POA: Diagnosis not present

## 2021-10-08 DIAGNOSIS — E039 Hypothyroidism, unspecified: Secondary | ICD-10-CM | POA: Diagnosis not present

## 2021-10-08 DIAGNOSIS — I129 Hypertensive chronic kidney disease with stage 1 through stage 4 chronic kidney disease, or unspecified chronic kidney disease: Secondary | ICD-10-CM | POA: Diagnosis not present

## 2021-10-08 DIAGNOSIS — Z5112 Encounter for antineoplastic immunotherapy: Secondary | ICD-10-CM | POA: Diagnosis not present

## 2021-10-08 LAB — CBC WITH DIFFERENTIAL/PLATELET
Abs Immature Granulocytes: 0.01 10*3/uL (ref 0.00–0.07)
Basophils Absolute: 0 10*3/uL (ref 0.0–0.1)
Basophils Relative: 0 %
Eosinophils Absolute: 0 10*3/uL (ref 0.0–0.5)
Eosinophils Relative: 1 %
HCT: 42.8 % (ref 36.0–46.0)
Hemoglobin: 13.8 g/dL (ref 12.0–15.0)
Immature Granulocytes: 0 %
Lymphocytes Relative: 33 %
Lymphs Abs: 1.3 10*3/uL (ref 0.7–4.0)
MCH: 29.1 pg (ref 26.0–34.0)
MCHC: 32.2 g/dL (ref 30.0–36.0)
MCV: 90.3 fL (ref 80.0–100.0)
Monocytes Absolute: 0.2 10*3/uL (ref 0.1–1.0)
Monocytes Relative: 6 %
Neutro Abs: 2.4 10*3/uL (ref 1.7–7.7)
Neutrophils Relative %: 60 %
Platelets: 167 10*3/uL (ref 150–400)
RBC: 4.74 MIL/uL (ref 3.87–5.11)
RDW: 14.7 % (ref 11.5–15.5)
WBC: 3.9 10*3/uL — ABNORMAL LOW (ref 4.0–10.5)
nRBC: 0 % (ref 0.0–0.2)

## 2021-10-08 LAB — COMPREHENSIVE METABOLIC PANEL
ALT: 14 U/L (ref 0–44)
AST: 13 U/L — ABNORMAL LOW (ref 15–41)
Albumin: 3.8 g/dL (ref 3.5–5.0)
Alkaline Phosphatase: 60 U/L (ref 38–126)
Anion gap: 9 (ref 5–15)
BUN: 26 mg/dL — ABNORMAL HIGH (ref 8–23)
CO2: 20 mmol/L — ABNORMAL LOW (ref 22–32)
Calcium: 9 mg/dL (ref 8.9–10.3)
Chloride: 117 mmol/L — ABNORMAL HIGH (ref 98–111)
Creatinine, Ser: 1.28 mg/dL — ABNORMAL HIGH (ref 0.44–1.00)
GFR, Estimated: 45 mL/min — ABNORMAL LOW (ref >60)
Glucose, Bld: 104 mg/dL — ABNORMAL HIGH (ref 70–99)
Potassium: 3.3 mmol/L — ABNORMAL LOW (ref 3.5–5.1)
Sodium: 146 mmol/L — ABNORMAL HIGH (ref 135–145)
Total Bilirubin: 0.5 mg/dL (ref 0.3–1.2)
Total Protein: 6.1 g/dL — ABNORMAL LOW (ref 6.5–8.1)

## 2021-10-08 LAB — TOTAL PROTEIN, URINE DIPSTICK: Protein, ur: 100 mg/dL — AB

## 2021-10-08 MED ORDER — ERGOCALCIFEROL 1.25 MG (50000 UT) PO CAPS: 50000.0000 [IU] | ORAL_CAPSULE | ORAL | 1 refills | Status: DC

## 2021-10-08 MED ORDER — SODIUM CHLORIDE 0.9 % IV SOLN
200.0000 mg | Freq: Once | INTRAVENOUS | Status: AC
  Administered 2021-10-08: 200 mg via INTRAVENOUS
  Filled 2021-10-08: qty 8

## 2021-10-08 MED ORDER — SODIUM CHLORIDE 0.9 % IV SOLN: Freq: Once | INTRAVENOUS | Status: AC

## 2021-10-08 MED ORDER — SODIUM CHLORIDE 0.9% FLUSH
10.0000 mL | Freq: Once | INTRAVENOUS | Status: AC
  Administered 2021-10-08: 10 mL

## 2021-10-08 MED ORDER — SODIUM CHLORIDE 0.9% FLUSH
10.0000 mL | INTRAVENOUS | Status: DC | PRN
  Administered 2021-10-08: 10 mL

## 2021-10-08 MED ORDER — HEPARIN SOD (PORK) LOCK FLUSH 100 UNIT/ML IV SOLN
500.0000 [IU] | Freq: Once | INTRAVENOUS | Status: AC | PRN
  Administered 2021-10-08: 500 [IU]

## 2021-10-08 NOTE — Progress Notes (Signed)
[facility] OFFICE PROGRESS NOTE  Patient Care Team: [name], MD as PCP - General (Family Medicine)  ASSESSMENT & PLAN:  Endometrial cancer [facility]) I have reviewed multiple imaging studies with the patient The persistent lymphadenopathy in the right axilla is stable Overall, while she does not have complete response, combination of pembrolizumab and lenvatinib is controlling her disease She will continue this treatment indefinitely I plan to repeat imaging study again in December  Essential hypertension Her documented blood pressure here is high even though according to the patient, at home, her blood pressure is normal in the 120s She has proteinuria I recommend the patient to reach out to her primary care doctor for further evaluation and management  Chronic kidney disease (CKD), stage III (moderate) (HCC) Recent renal function fluctuates up and down We discussed the importance of aggressive risk factor modification and to drink plenty of oral fluids  Orders Placed This Encounter  Procedures   CBC with Differential/Platelet    Standing Status:   Standing    Number of Occurrences:   22    Standing Expiration Date:   10/09/2022    All questions were answered. The patient knows to call the clinic with any problems, questions or concerns. The total time spent in the appointment was 20 minutes encounter with patients including review of chart and various tests results, discussions about plan of care and coordination of care plan   Artis Delay, MD 10/08/2021 10:22 AM  INTERVAL HISTORY: Please see below for problem oriented charting. she returns for treatment follow-up According to the patient, her documented blood pressure from home were satisfactory Denies leg swelling She is attempting to cut back the amount of fluid intake She is not on any medications for elevated blood sugar  REVIEW OF SYSTEMS:   Constitutional: Denies fevers, chills or abnormal weight  loss Eyes: Denies blurriness of vision Ears, nose, mouth, throat, and face: Denies mucositis or sore throat Respiratory: Denies cough, dyspnea or wheezes Cardiovascular: Denies palpitation, chest discomfort or lower extremity swelling Gastrointestinal:  Denies nausea, heartburn or change in bowel habits Skin: Denies abnormal skin rashes Lymphatics: Denies new lymphadenopathy or easy bruising Neurological:Denies numbness, tingling or new weaknesses Behavioral/Psych: Mood is stable, no new changes  All other systems were reviewed with the patient and are negative.  I have reviewed the past medical history, past surgical history, social history and family history with the patient and they are unchanged from previous note.  ALLERGIES:  is allergic to sulfa antibiotics and sulfamethoxazole.  MEDICATIONS:  Current Outpatient Medications  Medication Sig Dispense Refill   calcium carbonate (TUMS - DOSED IN MG ELEMENTAL CALCIUM) 500 MG chewable tablet Chew 2 tablets by mouth 2 (two) times daily.     cholecalciferol (VITAMIN D3) 25 MCG (1000 UNIT) tablet Take 5,000 Units by mouth daily.     amLODipine-valsartan (EXFORGE) 5-160 MG tablet Take 1 tablet by mouth daily.     atenolol (TENORMIN) 25 MG tablet Take 1 tablet by mouth once daily 30 tablet 0   Azelastine HCl 137 MCG/SPRAY SOLN Place into both nostrils.     Calcium Carb-Cholecalciferol 600-800 MG-UNIT TABS Take 1 tablet by mouth daily.     ergocalciferol (VITAMIN D2) 1.25 MG (50000 UT) capsule Take 1 capsule (50,000 Units total) by mouth once a week. 25 capsule 1   glucose blood (ONETOUCH VERIO) test strip Use as instructed to check blood sugars 3 times a day. 100 each 3   lenvatinib 10 mg daily  dose (LENVIMA, 10 MG DAILY DOSE,) capsule Take 1 capsule (10 mg total) by mouth daily. 30 capsule 11   levothyroxine (EUTHYROX) 200 MCG tablet Take 1 tablet (200 mcg total) by mouth daily before breakfast. 90 tablet 3   levothyroxine (EUTHYROX) 50 MCG  tablet Take 1 tablet (50 mcg total) by mouth daily before breakfast. 90 tablet 3   lidocaine-prilocaine (EMLA) cream Apply topically daily as needed. 30 g 3   OneTouch Delica Lancets 33G MISC 1 each by Other route 2 (two) times daily. Use to monitor glucose levels BID; E11.65 100 each 2   RELION PEN NEEDLES 32G X 4 MM MISC USE 1 PEN PER DAY (Patient not taking: Reported on 08/27/2021) 50 each 5   simvastatin (ZOCOR) 10 MG tablet Take 10 mg by mouth at bedtime.      No current facility-administered medications for this visit.   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 Pennie Rushing who obtained an endometrial biopsy showing poorly differentiated endometrial carcinoma and negative endocervical curettage   12/12/2017 Imaging   MAMMOGRAM FINDINGS: In the right axilla, a possible mass warrants further evaluation. In the left breast, no findings suspicious for malignancy.   Images were processed with CAD.   IMPRESSION: Further evaluation is suggested for possible mass in the right axilla.     12/24/2017 Imaging   Ct scan chest, abdomen and pelvis 1. Marked thickening of the endometrium (42 mm) compatible with known primary endometrial malignancy. No  evidence of extrauterine invasion. 2. No pelvic or retroperitoneal adenopathy. 3. Right middle lobe 4 mm solid pulmonary nodule, for which follow-up chest CT is advised in 3-6 months. 4. Several findings that are equivocal for distant metastatic disease. Vaguely nodular heterogeneous hyperenhancement in the peripheral right liver lobe, which could represent benign transient perfusional phenomena, with underlying liver lesions not entirely excluded. Mildly sclerotic T12 vertebral lesion. Mildly enlarged right axillary lymph node. The best single test to further evaluate these findings would be a PET-CT. Alternative tests include bone scan or thoracic MRI without and with IV contrast for the T12 osseous lesion, MRI abdomen without and with IV contrast for the liver findings, and diagnostic mammographic evaluation for the right axillary node. 5.  Aortic Atherosclerosis (ICD10-I70.0).     01/09/2018 PET scan   1. Moderate hypermetabolism corresponding to enlarging axillary node since 12/22/2017. Highly suspicious for an atypical distribution of metastatic disease. 2. No hypermetabolism to suggest hepatic or T12 osseous metastasis. 3. Hypermetabolic endometrial primary.   01/23/2018 Initial Diagnosis   Endometrial cancer (HCC)   01/23/2018 Pathology Results   1. Lymph node, sentinel, biopsy, left external iliac - NO CARCINOMA IDENTIFIED IN ONE LYMPH NODE (0/1) -  SEE COMMENT 2. Lymph nodes, regional resection, right para aortic - NO CARCINOMA IDENTIFIED IN FOUR LYMPH NODES (0/4) - SEE COMMENT 3. Lymph nodes, regional resection, right pelvic - NO CARCINOMA IDENTIFIED IN EIGHT LYMPH NODES (0/8) - SEE COMMENT 4. Cul-de-sac biopsy - METASTATIC CARCINOMA 5. Uterus +/- tubes/ovaries, neoplastic, cervix, bilateral fallopian tubes and ovaries UTERUS: - MIXED SEROUS AND ENDOMETRIOID CARCINOMA - SEROSAL IMPLANTS PRESENT - LYMPHOVASCULAR SPACE INVASION PRESENT - LEIOMYOMATA (1.5 CM; LARGEST) - SEE  ONCOLOGY TABLE AND COMMENT BELOW CERVIX: - BENIGN NABOTHIAN CYSTS - NO CARCINOMA IDENTIFIED BILATERAL OVARIES: - METASTATIC CARCINOMA PRESENT ON OVARIAN SURFACE BILATERAL FALLOPIAN TUBES: - INTRALUMINAL CARCINOMA Microscopic Comment 1. -3. Cytokeratin AE1/3 was performed on the sentinel lymph nodes to exclude micrometastasis. There is no evidence of metastatic carcinoma by immunohistochemistry. 5. 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%) Histologic Grade: N/A Myometrial invasion: Estimated less than 50% myometrial invasion (0.3 cm of myometrium involved; 1.4 cm measured thickness) Uterine Serosa Involvement: Present Cervical stromal involvement: Not identified Extent of involvement of other organs: - Fallopian tube (left within the lumen) - Ovary, left (surface involvement) - Cul-de-sac Lymphovascular invasion: Present Regional Lymph Nodes: Examined: 1 Sentinel 12 Non-sentinel 13 Total Lymph nodes with metastasis: 0 Isolated tumor cells (< 0.2 mm): 0 Micrometastasis: (> 0.2 mm and < 2.0 mm): 0 Macrometastasis: (> 2.0 mm): 0 Extracapsular extension: N/A Tumor block for ancillary studies: 5E, 5B MMR / MSI testing: Pending will be reported separately Pathologic Stage Classification (pTNM, AJCC 8th edition): pT3a, pN0 FIGO Stage: IIIA COMMENT: There is tumor present on the surface of the left ovary and within the lumen of the left fallopian tube. The carcinoma appears to be mixed with the largest component being high grade serous carcinoma. Dr. Colonel Bald reviewed the case and agrees with the above diagnosis.   01/23/2018 Surgery   Surgeon: Quinn Axe     Operation: Robotic-assisted laparoscopic total hysterectomy with bilateral  salpingoophorectomy, SLN injection, mapping and biopsy, right pelvic and para-aortic lymphadenectomy   Operative Findings:  : 10-12cm bulky uterus with frank serosal involvement on posterior cul de sac peritoneum and anterior peritoneum which adhesed the bladder to the anterior uterus. Unilateral mapping on left pelvis. No grossly suspicious nodes. Normal omentum and diaphragms.    02/01/2018 Pathology Results   Lymph node, needle/core biopsy, right axilla - METASTATIC CARCINOMA - SEE COMMENT Microscopic Comment The neoplastic cells are positive for cytokeratin 7 and Pax-8 but negative for cytokeratin 5/6, cytokeratin 20, Gata-3, p63, p53 and GCDFP. Overall, the immunoprofile is consistent with metastasis from the patient's known gynecologic carcinoma.   02/01/2018 Procedure   Ultrasound-guided core biopsies of a suspicious right axillary lymph node.   02/21/2018 - 06/13/2018 Chemotherapy   The patient had carboplatin & Taxol x 6   03/26/2018 - 04/30/2018 Radiation Therapy   Radiation treatment dates:   03/26/18, 04/02/18, 04/19/18, 04/23/18 04/30/18   Site/dose: proximal Vagina, 6 Gy in 5 fractions for a total dose of 30 Gy     04/26/2018 PET scan   1. Interval resection of the hypermetabolic endometrial primary. No discernible hypermetabolic pelvic sidewall or peritoneal metastases. 2. Stable hypermetabolic bilateral axillary lymphadenopathy. The degree of hypermetabolism in these lymph nodes remains highly suspicious for neoplasm. 3. Interval development of low level FDG uptake in 2 stable, small lymph nodes in the left groin region.  This may be reactive, but close attention recommended to exclude metastatic involvement. 4. Stable non hypermetabolic low-density liver lesions and the mixed lucent and sclerotic T12 lesion is stable without hypermetabolism today.   07/09/2018 Imaging   Status post hysterectomy.   Mild axillary lymphadenopathy, improved. Nodal metastases not excluded.    Irregular wall thickening involving the anterior bladder, possibly reflecting radiation cystitis versus tumor.   Additional stable findings as above, including a 5 mm right middle lobe nodule and stable sclerosis involving the T12 vertebral body.   10/10/2018 Imaging   1. Stable exam. No findings within the abdomen or pelvis to suggest metastatic disease. 2. Unchanged appearance of sclerosis involving the T12 vertebra. 3.  Aortic Atherosclerosis (ICD10-I70.0).   10/10/2018 Imaging   Ct abdomen and pelvis 1. Stable exam. No findings within the abdomen or pelvis to suggest metastatic disease. 2. Unchanged appearance of sclerosis involving the T12 vertebra. 3.  Aortic Atherosclerosis (ICD10-I70.0).   01/16/2019 PET scan   1. Enlargement and increased activity in the left axillary, right axillary, and left inguinal adenopathy compatible with progressive malignancy. 2. Stable 4 mm right middle lobe subpleural nodule, not appreciably hypermetabolic. 3. Other imaging findings of potential clinical significance: Right kidney lower pole cyst. Aortic Atherosclerosis (ICD10-I70.0). Prominent stool throughout the colon favors constipation. Mild chronic left maxillary sinusitis.     02/01/2019 -  Chemotherapy   The patient had pembrolizumab and Lenvima for chemotherapy treatment.     04/25/2019 Imaging   Ct imaging 1. Interval response to therapy. Decrease in size of bilateral axillary and left inguinal lymph nodes. No new or progressive findings identified. 2. Stable sclerotic appearance of the T12 vertebra. 3. Aortic atherosclerosis. Lad coronary artery calcification noted.   08/30/2019 Imaging   1. Bulky RIGHT hilar lymph node, slightly increased in size from previous imaging. 2. Multiple foci of hyperenhancement in the liver. The study is closer to in arterial phase on today's exam in these appear more numerous in the most recent prior, but more similar to the study of July 09, 2018 suspect that  these represent flash fill hemangiomata but they are quite numerous. Consider MRI liver on follow-up to establish a baseline for number of lesions as these will appear variable on CT follow-up based on phase of contrast acquisition in the future. 3. Stable 5 mm nodule adjacent to the fissure, minor fissure in the RIGHT middle lobe.     11/28/2019 Imaging   1. Stable exam. No new or progressive findings. 2. Stable enlarged right axillary lymph node. 3. Stable tiny bilateral pulmonary nodules. Continued attention on follow-up recommended. 4. Scattered hypodensities in the liver parenchyma correspond to the hypervascular lesions seen on the previous study performed with intravenous contrast material. 5. Right renal cyst. 6. Aortic Atherosclerosis (ICD10-I70.0).     03/16/2020 Imaging   Increased echogenicity of renal parenchyma is noted bilaterally suggesting medical renal disease. No hydronephrosis or renal obstruction is noted   04/02/2020 Imaging   1. Right axillary lymphadenopathy is stable. Tiny bilateral pulmonary nodules are stable. Subcentimeter low-attenuation liver lesions are stable. 2. No new or progressive metastatic disease in the chest, abdomen or pelvis. 3. Aortic Atherosclerosis (ICD10-I70.0).   09/15/2020 Imaging   1. Stable RIGHT axillary lymph node with enlargement measuring 2 cm short axis. 2. Stable small nodule along the minor fissure in the RIGHT chest. 3. No new or progressive findings. 4. Aortic atherosclerosis.   03/24/2021 Imaging   1. Right axillary lymphadenopathy is stable compared to prior studies.  2. No other definite signs of metastatic disease elsewhere in the thorax. 3. Hepatic steatosis. Numerous hypervascular areas noted in the liver, incompletely characterized on today's arterial phase examination. These are similar to remote prior examination from 08/30/2019, likely to represent small benign lesions such as flash fill cavernous hemangiomas. These could  be definitively evaluated with nonemergent abdominal MRI with and without IV gadolinium if of clinical concern. 4. Aortic atherosclerosis, in addition to left main and left anterior descending coronary artery disease. Assessment for potential risk factor modification, dietary therapy or pharmacologic therapy may be warranted, if clinically indicated.   Aortic Atherosclerosis (ICD10-I70.0).   09/16/2021 Imaging   1. Unchanged enlarged right axillary lymph node. No evidence of new lymphadenopathy or metastatic disease in the chest, abdomen, or pelvis. 2. Subcentimeter hyperenhancing lesions of the right lobe of the liver, hepatic segments VII and VIII are unchanged, measuring up to 0.7 cm. These are likely small benign incidental flash filling hemangiomata although incompletely characterized on this examination. Attention on follow-up. 3. Status post hysterectomy. 4. Cholelithiasis. 5. Coronary artery disease.   Aortic Atherosclerosis (ICD10-I70.0).       PHYSICAL EXAMINATION: ECOG PERFORMANCE STATUS: 0 - Asymptomatic  Vitals:   10/08/21 0828  BP: (!) 168/95  Pulse: 75  Resp: 18  Temp: (!) 97.4 F (36.3 C)  SpO2: 100%   Filed Weights   10/08/21 0828  Weight: 150 lb 12.8 oz (68.4 kg)    GENERAL:alert, no distress and comfortable  NEURO: alert & oriented x 3 with fluent speech, no focal motor/sensory deficits  LABORATORY DATA:  I have reviewed the data as listed    Component Value Date/Time   NA 146 (H) 10/08/2021 0756   K 3.3 (L) 10/08/2021 0756   CL 117 (H) 10/08/2021 0756   CO2 20 (L) 10/08/2021 0756   GLUCOSE 104 (H) 10/08/2021 0756   BUN 26 (H) 10/08/2021 0756   CREATININE 1.28 (H) 10/08/2021 0756   CREATININE 1.75 (H) 01/10/2020 1200   CALCIUM 9.0 10/08/2021 0756   PROT 6.1 (L) 10/08/2021 0756   ALBUMIN 3.8 10/08/2021 0756   AST 13 (L) 10/08/2021 0756   AST 13 (L) 01/10/2020 1200   ALT 14 10/08/2021 0756   ALT 11 01/10/2020 1200   ALKPHOS 60 10/08/2021 0756    BILITOT 0.5 10/08/2021 0756   BILITOT 0.4 01/10/2020 1200   GFRNONAA 45 (L) 10/08/2021 0756   GFRNONAA 29 (L) 01/10/2020 1200   GFRAA 34 (L) 01/10/2020 1200    No results found for: "SPEP", "UPEP"  Lab Results  Component Value Date   WBC 3.9 (L) 10/08/2021   NEUTROABS 2.4 10/08/2021   HGB 13.8 10/08/2021   HCT 42.8 10/08/2021   MCV 90.3 10/08/2021   PLT 167 10/08/2021      Chemistry      Component Value Date/Time   NA 146 (H) 10/08/2021 0756   K 3.3 (L) 10/08/2021 0756   CL 117 (H) 10/08/2021 0756   CO2 20 (L) 10/08/2021 0756   BUN 26 (H) 10/08/2021 0756   CREATININE 1.28 (H) 10/08/2021 0756   CREATININE 1.75 (H) 01/10/2020 1200      Component Value Date/Time   CALCIUM 9.0 10/08/2021 0756   ALKPHOS 60 10/08/2021 0756   AST 13 (L) 10/08/2021 0756   AST 13 (L) 01/10/2020 1200   ALT 14 10/08/2021 0756   ALT 11 01/10/2020 1200   BILITOT 0.5 10/08/2021 0756   BILITOT 0.4 01/10/2020 1200

## 2021-10-14 DIAGNOSIS — N1831 Chronic kidney disease, stage 3a: Secondary | ICD-10-CM | POA: Diagnosis not present

## 2021-10-14 DIAGNOSIS — M8580 Other specified disorders of bone density and structure, unspecified site: Secondary | ICD-10-CM | POA: Diagnosis not present

## 2021-10-14 DIAGNOSIS — I10 Essential (primary) hypertension: Secondary | ICD-10-CM | POA: Diagnosis not present

## 2021-10-14 DIAGNOSIS — E1122 Type 2 diabetes mellitus with diabetic chronic kidney disease: Secondary | ICD-10-CM | POA: Diagnosis not present

## 2021-10-14 DIAGNOSIS — E785 Hyperlipidemia, unspecified: Secondary | ICD-10-CM | POA: Diagnosis not present

## 2021-10-29 ENCOUNTER — Inpatient Hospital Stay (HOSPITAL_BASED_OUTPATIENT_CLINIC_OR_DEPARTMENT_OTHER): Payer: Medicare Other | Admitting: Hematology and Oncology

## 2021-10-29 ENCOUNTER — Encounter: Payer: Self-pay | Admitting: Hematology and Oncology

## 2021-10-29 ENCOUNTER — Other Ambulatory Visit: Payer: Self-pay

## 2021-10-29 ENCOUNTER — Inpatient Hospital Stay: Payer: Medicare Other | Attending: Gynecology

## 2021-10-29 ENCOUNTER — Inpatient Hospital Stay: Payer: Medicare Other

## 2021-10-29 VITALS — BP 131/73 | HR 68 | Temp 98.2°F | Resp 18

## 2021-10-29 DIAGNOSIS — I10 Essential (primary) hypertension: Secondary | ICD-10-CM

## 2021-10-29 DIAGNOSIS — C541 Malignant neoplasm of endometrium: Secondary | ICD-10-CM | POA: Insufficient documentation

## 2021-10-29 DIAGNOSIS — Z7189 Other specified counseling: Secondary | ICD-10-CM

## 2021-10-29 DIAGNOSIS — I129 Hypertensive chronic kidney disease with stage 1 through stage 4 chronic kidney disease, or unspecified chronic kidney disease: Secondary | ICD-10-CM | POA: Diagnosis not present

## 2021-10-29 DIAGNOSIS — C773 Secondary and unspecified malignant neoplasm of axilla and upper limb lymph nodes: Secondary | ICD-10-CM

## 2021-10-29 DIAGNOSIS — N183 Chronic kidney disease, stage 3 unspecified: Secondary | ICD-10-CM | POA: Diagnosis not present

## 2021-10-29 DIAGNOSIS — Z5112 Encounter for antineoplastic immunotherapy: Secondary | ICD-10-CM | POA: Diagnosis not present

## 2021-10-29 LAB — COMPREHENSIVE METABOLIC PANEL
ALT: 12 U/L (ref 0–44)
AST: 13 U/L — ABNORMAL LOW (ref 15–41)
Albumin: 3.4 g/dL — ABNORMAL LOW (ref 3.5–5.0)
Alkaline Phosphatase: 56 U/L (ref 38–126)
Anion gap: 6 (ref 5–15)
BUN: 23 mg/dL (ref 8–23)
CO2: 22 mmol/L (ref 22–32)
Calcium: 8.3 mg/dL — ABNORMAL LOW (ref 8.9–10.3)
Chloride: 117 mmol/L — ABNORMAL HIGH (ref 98–111)
Creatinine, Ser: 1.3 mg/dL — ABNORMAL HIGH (ref 0.44–1.00)
GFR, Estimated: 44 mL/min — ABNORMAL LOW (ref >60)
Glucose, Bld: 143 mg/dL — ABNORMAL HIGH (ref 70–99)
Potassium: 3.2 mmol/L — ABNORMAL LOW (ref 3.5–5.1)
Sodium: 145 mmol/L (ref 135–145)
Total Bilirubin: 0.5 mg/dL (ref 0.3–1.2)
Total Protein: 5.6 g/dL — ABNORMAL LOW (ref 6.5–8.1)

## 2021-10-29 LAB — CBC WITH DIFFERENTIAL/PLATELET
Abs Immature Granulocytes: 0.01 10*3/uL (ref 0.00–0.07)
Basophils Absolute: 0 10*3/uL (ref 0.0–0.1)
Basophils Relative: 0 %
Eosinophils Absolute: 0 10*3/uL (ref 0.0–0.5)
Eosinophils Relative: 1 %
HCT: 41.7 % (ref 36.0–46.0)
Hemoglobin: 13.6 g/dL (ref 12.0–15.0)
Immature Granulocytes: 0 %
Lymphocytes Relative: 32 %
Lymphs Abs: 1.3 10*3/uL (ref 0.7–4.0)
MCH: 29.6 pg (ref 26.0–34.0)
MCHC: 32.6 g/dL (ref 30.0–36.0)
MCV: 90.8 fL (ref 80.0–100.0)
Monocytes Absolute: 0.2 10*3/uL (ref 0.1–1.0)
Monocytes Relative: 5 %
Neutro Abs: 2.4 10*3/uL (ref 1.7–7.7)
Neutrophils Relative %: 62 %
Platelets: 155 10*3/uL (ref 150–400)
RBC: 4.59 MIL/uL (ref 3.87–5.11)
RDW: 14.6 % (ref 11.5–15.5)
WBC: 4 10*3/uL (ref 4.0–10.5)
nRBC: 0 % (ref 0.0–0.2)

## 2021-10-29 LAB — TOTAL PROTEIN, URINE DIPSTICK: Protein, ur: 30 mg/dL — AB

## 2021-10-29 MED ORDER — SODIUM CHLORIDE 0.9 % IV SOLN: Freq: Once | INTRAVENOUS | Status: AC

## 2021-10-29 MED ORDER — HEPARIN SOD (PORK) LOCK FLUSH 100 UNIT/ML IV SOLN
500.0000 [IU] | Freq: Once | INTRAVENOUS | Status: AC | PRN
  Administered 2021-10-29: 500 [IU]

## 2021-10-29 MED ORDER — SODIUM CHLORIDE 0.9 % IV SOLN
200.0000 mg | Freq: Once | INTRAVENOUS | Status: AC
  Administered 2021-10-29: 200 mg via INTRAVENOUS
  Filled 2021-10-29: qty 200

## 2021-10-29 MED ORDER — SODIUM CHLORIDE 0.9% FLUSH
10.0000 mL | Freq: Once | INTRAVENOUS | Status: AC
  Administered 2021-10-29: 10 mL

## 2021-10-29 MED ORDER — ATENOLOL 25 MG PO TABS: 25.0000 mg | ORAL_TABLET | Freq: Every day | ORAL | 1 refills | Status: DC

## 2021-10-29 MED ORDER — SODIUM CHLORIDE 0.9% FLUSH
10.0000 mL | INTRAVENOUS | Status: DC | PRN
  Administered 2021-10-29: 10 mL

## 2021-10-29 NOTE — Assessment & Plan Note (Signed)
Her documented blood pressure here is high even though according to the patient, at home, her blood pressure is normal in the 120s She has mild proteinuria She will continue current blood pressure management

## 2021-10-29 NOTE — Assessment & Plan Note (Signed)
Recent renal function fluctuates up and down We discussed the importance of aggressive risk factor modification and to drink plenty of oral fluids

## 2021-10-29 NOTE — Patient Instructions (Signed)
[facility] MEDICAL ONCOLOGY  Discharge Instructions: Thank you for choosing [facility] to provide your oncology and hematology care.   If you have a lab appointment with the Cancer Center, please go directly to the Cancer Center and check in at the registration area.   Wear comfortable clothing and clothing appropriate for easy access to any Portacath or PICC line.   We strive to give you quality time with your provider. You may need to reschedule your appointment if you arrive late (15 or more minutes).  Arriving late affects you and other patients whose appointments are after yours.  Also, if you miss three or more appointments without notifying the office, you may be dismissed from the clinic at the provider's discretion.      For prescription refill requests, have your pharmacy contact our office and allow 72 hours for refills to be completed.    Today you received the following chemotherapy and/or immunotherapy agents: Keytruda      To help prevent nausea and vomiting after your treatment, we encourage you to take your nausea medication as directed.  BELOW ARE SYMPTOMS THAT SHOULD BE REPORTED IMMEDIATELY: *FEVER GREATER THAN 100.4 F (38 C) OR HIGHER *CHILLS OR SWEATING *NAUSEA AND VOMITING THAT IS NOT CONTROLLED WITH YOUR NAUSEA MEDICATION *UNUSUAL SHORTNESS OF BREATH *UNUSUAL BRUISING OR BLEEDING *URINARY PROBLEMS (pain or burning when urinating, or frequent urination) *BOWEL PROBLEMS (unusual diarrhea, constipation, pain near the anus) TENDERNESS IN MOUTH AND THROAT WITH OR WITHOUT PRESENCE OF ULCERS (sore throat, sores in mouth, or a toothache) UNUSUAL RASH, SWELLING OR PAIN  UNUSUAL VAGINAL DISCHARGE OR ITCHING   Items with * indicate a potential emergency and should be followed up as soon as possible or go to the Emergency Department if any problems should occur.  Please show the CHEMOTHERAPY ALERT CARD or IMMUNOTHERAPY ALERT CARD at check-in to  the Emergency Department and triage nurse.  Should you have questions after your visit or need to cancel or reschedule your appointment, please contact [facility]ICAL ONCOLOG[facility]  Dept: [phone]  and follow the prompts.  Office hours are 8:00 a.m. to 4:30 p.m. Monday - Friday. Please note that voicemails left after 4:00 p.m. may not be returned until the following business day.  We are closed weekends and major holidays. You have access to a nurse at all times for urgent questions. Please call the main number to the clinic Dept: [phone] and follow the prompts.   For any non-urgent questions, you may also contact your provider using MyChart. We now offer e-Visits for anyone 18 and older to request care online for non-urgent symptoms. For details visit [email or web address].   Also download the MyChart app! Go to the app store, search "MyChart", open the app, select [facility], and log in with your MyChart username and password.  Masks are optional in the cancer centers. If you would like for your care team to wear a mask while they are taking care of you, please let them know. For doctor visits, patients may have with them one support person who is at least 71 years old. At this time, visitors are not allowed in the infusion area. 

## 2021-10-29 NOTE — Assessment & Plan Note (Signed)
Recent multiple imaging studies showed persistent lymphadenopathy in the right axilla, stable Overall, while she does not have complete response, combination of pembrolizumab and lenvatinib is controlling her disease She will continue this treatment indefinitely I plan to repeat imaging study again in December

## 2021-10-29 NOTE — Progress Notes (Signed)
Krista Johnson OFFICE PROGRESS NOTE  Patient Care Team: Krista Jordan, MD as PCP - General (Family Medicine)  ASSESSMENT & PLAN:  Endometrial cancer [name]) Recent multiple imaging studies showed persistent lymphadenopathy in the right axilla, stable Overall, while she does not have complete response, combination of pembrolizumab and lenvatinib is controlling her disease She will continue this treatment indefinitely I plan to repeat imaging study again in December  Chronic kidney disease (CKD), stage III (moderate) (HCC) Recent renal function fluctuates up and down We discussed the importance of aggressive risk factor modification and to drink plenty of oral fluids  Essential hypertension Her documented blood pressure here is high even though according to the patient, at home, her blood pressure is normal in the 120s She has mild proteinuria She will continue current blood pressure management  No orders of the defined types were placed in this encounter.   All questions were answered. The patient knows to call the clinic with any problems, questions or concerns. The total time spent in the appointment was 20 minutes encounter with patients including review of chart and various tests results, discussions about plan of care and coordination of care plan   Krista Lark, MD 10/29/2021 11:58 AM  INTERVAL HISTORY: Please see below for problem oriented charting. she returns for treatment follow-up on combination Lenvima with pembrolizumab for uterine cancer She is doing well No new side effects of treatment Her documented blood pressure at home is adequate/good controlled  REVIEW OF SYSTEMS:   Constitutional: Denies fevers, chills or abnormal weight loss Eyes: Denies blurriness of vision Ears, nose, mouth, throat, and face: Denies mucositis or sore throat Respiratory: Denies cough, dyspnea or wheezes Cardiovascular: Denies palpitation, chest discomfort or lower extremity  swelling Gastrointestinal:  Denies nausea, heartburn or change in bowel habits Skin: Denies abnormal skin rashes Lymphatics: Denies new lymphadenopathy or easy bruising Neurological:Denies numbness, tingling or new weaknesses Behavioral/Psych: Mood is stable, no new changes  All other systems were reviewed with the patient and are negative.  I have reviewed the past medical history, past surgical history, social history and family history with the patient and they are unchanged from previous note.  ALLERGIES:  is allergic to sulfa antibiotics and sulfamethoxazole.  MEDICATIONS:  Current Outpatient Medications  Medication Sig Dispense Refill   amLODipine-valsartan (EXFORGE) 5-160 MG tablet Take 1 tablet by mouth daily.     atenolol (TENORMIN) 25 MG tablet Take 1 tablet (25 mg total) by mouth daily. 90 tablet 1   Azelastine HCl 137 MCG/SPRAY SOLN Place into both nostrils.     Calcium Carb-Cholecalciferol 600-800 MG-UNIT TABS Take 1 tablet by mouth daily.     calcium carbonate (TUMS - DOSED IN MG ELEMENTAL CALCIUM) 500 MG chewable tablet Chew 2 tablets by mouth 2 (two) times daily.     cholecalciferol (VITAMIN D3) 25 MCG (1000 UNIT) tablet Take 5,000 Units by mouth daily.     ergocalciferol (VITAMIN D2) 1.25 MG (50000 UT) capsule Take 1 capsule (50,000 Units total) by mouth once a week. 25 capsule 1   glucose blood (ONETOUCH VERIO) test strip Use as instructed to check blood sugars 3 times a day. 100 each 3   lenvatinib 10 mg daily dose (LENVIMA, 10 MG DAILY DOSE,) capsule Take 1 capsule (10 mg total) by mouth daily. 30 capsule 11   levothyroxine (EUTHYROX) 200 MCG tablet Take 1 tablet (200 mcg total) by mouth daily before breakfast. 90 tablet 3   levothyroxine (EUTHYROX) 50 MCG tablet Take 1  tablet (50 mcg total) by mouth daily before breakfast. 90 tablet 3   lidocaine-prilocaine (EMLA) cream Apply topically daily as needed. 30 g 3   OneTouch Delica Lancets 80H MISC 1 each by Other route 2  (two) times daily. Use to monitor glucose levels BID; E11.65 100 each 2   RELION PEN NEEDLES 32G X 4 MM MISC USE 1 PEN PER DAY (Patient not taking: Reported on 08/27/2021) 50 each 5   simvastatin (ZOCOR) 10 MG tablet Take 10 mg by mouth at bedtime.      No current facility-administered medications for this visit.   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 Leo Grosser who obtained an endometrial biopsy showing poorly differentiated endometrial carcinoma and negative endocervical curettage   12/12/2017 Imaging   MAMMOGRAM FINDINGS: In the right axilla, a possible mass warrants further evaluation. In the left breast, no findings suspicious for malignancy.   Images were processed with CAD.   IMPRESSION: Further evaluation is suggested for possible mass in the right axilla.     12/24/2017 Imaging   Ct scan chest, abdomen and pelvis 1. Marked thickening of the endometrium (42 mm) compatible with known primary endometrial malignancy. No evidence of extrauterine invasion. 2. No pelvic or retroperitoneal adenopathy. 3. Right middle lobe 4 mm solid pulmonary nodule, for which follow-up chest CT is advised in 3-6 months. 4. Several findings that are equivocal for distant metastatic disease. Vaguely nodular heterogeneous hyperenhancement in the peripheral right liver lobe, which could represent benign transient perfusional phenomena, with underlying liver lesions not entirely excluded.  Mildly sclerotic T12 vertebral lesion. Mildly enlarged right axillary lymph node. The best single test to further evaluate these findings would be a PET-CT. Alternative tests include bone scan or thoracic MRI without and with IV contrast for the T12 osseous lesion, MRI abdomen without and with IV contrast for the liver findings, and diagnostic mammographic evaluation for the right axillary node. 5.  Aortic Atherosclerosis (ICD10-I70.0).     01/09/2018 PET scan   1. Moderate hypermetabolism corresponding to enlarging axillary node since 12/22/2017. Highly suspicious for an atypical distribution of metastatic disease. 2. No hypermetabolism to suggest hepatic or T12 osseous metastasis. 3. Hypermetabolic endometrial primary.   01/23/2018 Initial Diagnosis   Endometrial cancer ([place])   01/23/2018 Pathology Results   1. Lymph node, sentinel, biopsy, left external iliac - NO CARCINOMA IDENTIFIED IN ONE LYMPH NODE (0/1) - SEE COMMENT 2. Lymph nodes, regional resection, right para aortic - NO CARCINOMA IDENTIFIED IN FOUR LYMPH NODES (0/4) - SEE COMMENT 3. Lymph nodes, regional resection, right pelvic - NO CARCINOMA IDENTIFIED IN EIGHT LYMPH NODES (0/8) - SEE COMMENT 4. Cul-de-sac biopsy - METASTATIC CARCINOMA 5. Uterus +/- tubes/ovaries, neoplastic, cervix, bilateral fallopian tubes and ovaries UTERUS: - MIXED SEROUS AND ENDOMETRIOID CARCINOMA - SEROSAL IMPLANTS PRESENT - LYMPHOVASCULAR SPACE INVASION PRESENT - LEIOMYOMATA (1.5 CM; LARGEST) - SEE ONCOLOGY TABLE AND COMMENT BELOW CERVIX: - BENIGN NABOTHIAN CYSTS - NO CARCINOMA IDENTIFIED BILATERAL OVARIES: - METASTATIC  CARCINOMA PRESENT ON OVARIAN SURFACE BILATERAL FALLOPIAN TUBES: - INTRALUMINAL CARCINOMA Microscopic Comment 1. -3. Cytokeratin AE1/3 was performed on the sentinel lymph nodes to exclude micrometastasis. There is no evidence of metastatic carcinoma by immunohistochemistry. 5. 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%) Histologic Grade: N/A Myometrial invasion: Estimated less than 50% myometrial invasion (0.3 cm of myometrium involved; 1.4 cm measured thickness) Uterine Serosa Involvement: Present Cervical stromal involvement: Not identified Extent of involvement of other organs: - Fallopian tube (left within the lumen) - Ovary, left (surface involvement) - Cul-de-sac Lymphovascular invasion: Present Regional Lymph Nodes: Examined: 1 Sentinel 12 Non-sentinel 13 Total Lymph nodes with metastasis: 0 Isolated tumor cells (< 0.2 mm): 0 Micrometastasis: (> 0.2 mm and < 2.0 mm): 0 Macrometastasis: (> 2.0 mm): 0 Extracapsular extension: N/A Tumor block for ancillary studies: 5E, 5B MMR / MSI testing: Pending will be reported separately Pathologic Stage Classification (pTNM, AJCC 8th edition): pT3a, pN0 FIGO Stage: IIIA COMMENT: There is tumor present on the surface of the left ovary and within the lumen of the left fallopian tube. The carcinoma appears to be mixed with the largest component being high grade serous carcinoma. Dr. Lyndon Code reviewed the case and agrees with the above diagnosis.   01/23/2018 Surgery   Surgeon: Donaciano Eva     Operation: Robotic-assisted laparoscopic total hysterectomy with bilateral salpingoophorectomy, SLN injection, mapping and biopsy, right pelvic and para-aortic lymphadenectomy   Operative Findings:  : 10-12cm bulky uterus with frank serosal involvement on posterior cul de sac peritoneum and anterior peritoneum which adhesed the bladder to the anterior uterus. Unilateral mapping on left pelvis. No grossly suspicious nodes. Normal omentum and diaphragms.    02/01/2018 Pathology Results   Lymph node, needle/core biopsy, right axilla -  METASTATIC CARCINOMA - SEE COMMENT Microscopic Comment The neoplastic cells are positive for cytokeratin 7 and Pax-8 but negative for cytokeratin 5/6, cytokeratin 20, Gata-3, p63, p53 and GCDFP. Overall, the immunoprofile is consistent with metastasis from the patient's known gynecologic carcinoma.   02/01/2018 Procedure   Ultrasound-guided core biopsies of a suspicious right axillary lymph node.   02/21/2018 - 06/13/2018 Chemotherapy   The patient had carboplatin & Taxol x 6   03/26/2018 - 04/30/2018 Radiation Therapy   Radiation treatment dates:   03/26/18, 04/02/18, 04/19/18, 04/23/18 04/30/18   Site/dose: proximal Vagina, 6 Gy in 5 fractions for a total dose of 30 Gy     04/26/2018 PET scan   1. Interval resection of the hypermetabolic endometrial primary. No discernible hypermetabolic pelvic sidewall or peritoneal metastases. 2. Stable hypermetabolic bilateral axillary lymphadenopathy. The degree of hypermetabolism in these lymph nodes remains highly suspicious for neoplasm. 3. Interval development of low level FDG uptake in 2 stable, small lymph nodes in the left groin region. This may be reactive, but close attention recommended to exclude metastatic involvement. 4. Stable non hypermetabolic low-density liver lesions and the mixed lucent and sclerotic T12 lesion is stable without hypermetabolism today.   07/09/2018 Imaging   Status post hysterectomy.   Mild axillary lymphadenopathy, improved. Nodal metastases not excluded.   Irregular wall thickening involving the anterior bladder, possibly reflecting radiation cystitis versus tumor.   Additional stable findings as above, including a 5 mm right middle lobe nodule and stable sclerosis involving the T12 vertebral body.   10/10/2018 Imaging   1. Stable exam. No  findings within the abdomen or pelvis to suggest metastatic disease. 2. Unchanged appearance of sclerosis involving the T12 vertebra. 3.  Aortic Atherosclerosis (ICD10-I70.0).    10/10/2018 Imaging   Ct abdomen and pelvis 1. Stable exam. No findings within the abdomen or pelvis to suggest metastatic disease. 2. Unchanged appearance of sclerosis involving the T12 vertebra. 3.  Aortic Atherosclerosis (ICD10-I70.0).   01/16/2019 PET scan   1. Enlargement and increased activity in the left axillary, right axillary, and left inguinal adenopathy compatible with progressive malignancy. 2. Stable 4 mm right middle lobe subpleural nodule, not appreciably hypermetabolic. 3. Other imaging findings of potential clinical significance: Right kidney lower pole cyst. Aortic Atherosclerosis (ICD10-I70.0). Prominent stool throughout the colon favors constipation. Mild chronic left maxillary sinusitis.     02/01/2019 -  Chemotherapy   The patient had pembrolizumab and Lenvima for chemotherapy treatment.     04/25/2019 Imaging   Ct imaging 1. Interval response to therapy. Decrease in size of bilateral axillary and left inguinal lymph nodes. No new or progressive findings identified. 2. Stable sclerotic appearance of the T12 vertebra. 3. Aortic atherosclerosis. Lad coronary artery calcification noted.   08/30/2019 Imaging   1. Bulky RIGHT hilar lymph node, slightly increased in size from previous imaging. 2. Multiple foci of hyperenhancement in the liver. 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 Consider MRI liver on follow-up to establish a baseline for number of lesions as these will appear variable on CT follow-up based on phase of contrast acquisition in the future. 3. Stable 5 mm nodule adjacent to the fissure, minor fissure in the RIGHT middle lobe.     11/28/2019 Imaging   1. Stable exam. No new or progressive findings. 2. Stable enlarged right axillary lymph node. 3. Stable tiny  bilateral pulmonary nodules. Continued attention on follow-up recommended. 4. Scattered hypodensities in the liver parenchyma correspond to the hypervascular lesions seen on the previous study performed with intravenous contrast material. 5. Right renal cyst. 6. Aortic Atherosclerosis (ICD10-I70.0).     03/16/2020 Imaging   Increased echogenicity of renal parenchyma is noted bilaterally suggesting medical renal disease. No hydronephrosis or renal obstruction is noted   04/02/2020 Imaging   1. Right axillary lymphadenopathy is stable. Tiny bilateral pulmonary nodules are stable. Subcentimeter low-attenuation liver lesions are stable. 2. No new or progressive metastatic disease in the chest, abdomen or pelvis. 3. Aortic Atherosclerosis (ICD10-I70.0).   09/15/2020 Imaging   1. Stable RIGHT axillary lymph node with enlargement measuring 2 cm short axis. 2. Stable small nodule along the minor fissure in the RIGHT chest. 3. No new or progressive findings. 4. Aortic atherosclerosis.   03/24/2021 Imaging   1. Right axillary lymphadenopathy is stable compared to prior studies. 2. No other definite signs of metastatic disease elsewhere in the thorax. 3. Hepatic steatosis. Numerous hypervascular areas noted in the liver, incompletely characterized on today's arterial phase examination. These are similar to remote prior examination from 08/30/2019, likely to represent small benign lesions such as flash fill cavernous hemangiomas. These could be definitively evaluated with nonemergent abdominal MRI with and without IV gadolinium if of clinical concern. 4. Aortic atherosclerosis, in addition to left main and left anterior descending coronary artery disease. Assessment for potential risk factor modification, dietary therapy or pharmacologic therapy may be warranted, if clinically  indicated.   Aortic Atherosclerosis (ICD10-I70.0).   09/16/2021 Imaging   1. Unchanged enlarged right axillary lymph node. No  evidence of new lymphadenopathy or metastatic disease in the chest, abdomen, or pelvis. 2. Subcentimeter hyperenhancing lesions of the right lobe of the liver, hepatic segments VII and VIII are unchanged, measuring up to 0.7 cm. These are likely small benign incidental flash filling hemangiomata although incompletely characterized on this examination. Attention on follow-up. 3. Status post hysterectomy. 4. Cholelithiasis. 5. Coronary artery disease.   Aortic Atherosclerosis (ICD10-I70.0).       PHYSICAL EXAMINATION: ECOG PERFORMANCE STATUS: 1 - Symptomatic but completely ambulatory  Vitals:   10/29/21 0924  BP: 140/74  Pulse: 78  Resp: 18  Temp: 97.7 F (36.5 C)  SpO2: 100%   Filed Weights   10/29/21 0924  Weight: 147 lb 6.4 oz (66.9 kg)    GENERAL:alert, no distress and comfortable NEURO: alert & oriented x 3 with fluent speech, no focal motor/sensory deficits  LABORATORY DATA:  I have reviewed the data as listed    Component Value Date/Time   NA 145 10/29/2021 0900   K 3.2 (L) 10/29/2021 0900   CL 117 (H) 10/29/2021 0900   CO2 22 10/29/2021 0900   GLUCOSE 143 (H) 10/29/2021 0900   BUN 23 10/29/2021 0900   CREATININE 1.30 (H) 10/29/2021 0900   CREATININE 1.75 (H) 01/10/2020 1200   CALCIUM 8.3 (L) 10/29/2021 0900   PROT 5.6 (L) 10/29/2021 0900   ALBUMIN 3.4 (L) 10/29/2021 0900   AST 13 (L) 10/29/2021 0900   AST 13 (L) 01/10/2020 1200   ALT 12 10/29/2021 0900   ALT 11 01/10/2020 1200   ALKPHOS 56 10/29/2021 0900   BILITOT 0.5 10/29/2021 0900   BILITOT 0.4 01/10/2020 1200   GFRNONAA 44 (L) 10/29/2021 0900   GFRNONAA 29 (L) 01/10/2020 1200   GFRAA 34 (L) 01/10/2020 1200    No results found for: "SPEP", "UPEP"  Lab Results  Component Value Date   WBC 4.0 10/29/2021   NEUTROABS 2.4 10/29/2021   HGB 13.6 10/29/2021   HCT 41.7 10/29/2021   MCV 90.8 10/29/2021   PLT 155 10/29/2021      Chemistry      Component Value Date/Time   NA 145 10/29/2021 0900    K 3.2 (L) 10/29/2021 0900   CL 117 (H) 10/29/2021 0900   CO2 22 10/29/2021 0900   BUN 23 10/29/2021 0900   CREATININE 1.30 (H) 10/29/2021 0900   CREATININE 1.75 (H) 01/10/2020 1200      Component Value Date/Time   CALCIUM 8.3 (L) 10/29/2021 0900   ALKPHOS 56 10/29/2021 0900   AST 13 (L) 10/29/2021 0900   AST 13 (L) 01/10/2020 1200   ALT 12 10/29/2021 0900   ALT 11 01/10/2020 1200   BILITOT 0.5 10/29/2021 0900   BILITOT 0.4 01/10/2020 1200

## 2021-11-02 ENCOUNTER — Encounter: Payer: Self-pay | Admitting: Podiatry

## 2021-11-02 ENCOUNTER — Ambulatory Visit (INDEPENDENT_AMBULATORY_CARE_PROVIDER_SITE_OTHER): Payer: Medicare Other | Admitting: Podiatry

## 2021-11-02 DIAGNOSIS — M79675 Pain in left toe(s): Secondary | ICD-10-CM

## 2021-11-02 DIAGNOSIS — M79674 Pain in right toe(s): Secondary | ICD-10-CM

## 2021-11-02 DIAGNOSIS — E0822 Diabetes mellitus due to underlying condition with diabetic chronic kidney disease: Secondary | ICD-10-CM | POA: Diagnosis not present

## 2021-11-02 DIAGNOSIS — N183 Chronic kidney disease, stage 3 unspecified: Secondary | ICD-10-CM

## 2021-11-02 DIAGNOSIS — B351 Tinea unguium: Secondary | ICD-10-CM

## 2021-11-02 DIAGNOSIS — L84 Corns and callosities: Secondary | ICD-10-CM | POA: Diagnosis not present

## 2021-11-02 DIAGNOSIS — T451X5A Adverse effect of antineoplastic and immunosuppressive drugs, initial encounter: Secondary | ICD-10-CM

## 2021-11-02 DIAGNOSIS — G620 Drug-induced polyneuropathy: Secondary | ICD-10-CM | POA: Diagnosis not present

## 2021-11-02 DIAGNOSIS — Q828 Other specified congenital malformations of skin: Secondary | ICD-10-CM | POA: Diagnosis not present

## 2021-11-02 DIAGNOSIS — M8580 Other specified disorders of bone density and structure, unspecified site: Secondary | ICD-10-CM | POA: Insufficient documentation

## 2021-11-02 DIAGNOSIS — Z794 Long term (current) use of insulin: Secondary | ICD-10-CM

## 2021-11-07 NOTE — Progress Notes (Signed)
  Subjective:  Patient ID: Krista Johnson, female    DOB: Jan 14, 1951,  MRN: [id number]  Krista Johnson presents to clinic today for at risk foot care. Pt has h/o chemotherapy induced neuropathy and NIDDM with chronic kidney disease and callus(es) right lower extremity, porokeratotic lesion(s) b/l lower extremities, and painful mycotic nails. Painful toenails interfere with ambulation. Aggravating factors include wearing enclosed shoe gear. Pain is relieved with periodic professional debridement. Painful callus(es) and porokeratotic lesion(s) are aggravated when weightbearing with and without shoegear. Pain is relieved with periodic professional debridement.  Patient states blood glucose was 91 mg/dl today.    Last A1c was 5.8%.  Last known HgA1c was unknown.   New problem(s): None.   PCP is Jonathon Jordan, MD , and last visit was  September 27, 2021  Allergies  Allergen Reactions   Sulfa Antibiotics Nausea Only   Sulfamethoxazole Nausea Only    Review of Systems: Negative except as noted in the HPI.  Objective:  Constitutional Patient is a pleasant 71 y.o. African American female WD, WN in NAD. AAO x 3.  Vascular Capillary fill time to digits immediate b/l.  DP/PT pulse(s) are palpable b/l lower extremities. Pedal hair sparse. Lower extremity skin temperature gradient within normal limits. No pain with calf compression b/l. No edema noted b/l lower extremities. No cyanosis or clubbing noted.   Neurologic Protective sensation intact 5/5 intact bilaterally with 10g monofilament b/l. Vibratory sensation intact b/l. No clonus b/l. Pt has subjective symptoms of neuropathy.  Dermatologic Pedal skin is warm and supple b/l.  No open wounds b/l lower extremities. No interdigital macerations b/l lower extremities. Toenails 1-5 b/l elongated, discolored, dystrophic, thickened, crumbly with subungual debris and tenderness to dorsal palpation. Hyperkeratotic lesion(s) right 5th metatarsal base.  No  erythema, no edema, no drainage, no fluctuance. Porokeratotic lesion(s) submet head 4 b/l LE and plantar left heel. No erythema, no edema, no drainage, no fluctuance.  Orthopedic: Normal muscle strength 5/5 to all lower extremity muscle groups bilaterally. Patient ambulates independent of any assistive aids. Normal muscle strength 5/5 to all lower extremity muscle groups bilaterally. Hammertoe(s) noted to the bilateral 5th toes.. No pain, crepitus or joint limitation noted with ROM b/l LE.  Patient ambulates independently without assistive aids.      Latest Ref Rng & Units 10/06/2021   10:37 AM 05/27/2021   10:01 AM 01/21/2021    9:51 AM  Hemoglobin A1C  Hemoglobin-A1c 4.0 - 5.6 % 5.8  6.2  5.8    Assessment/Plan: 1. Pain due to onychomycosis of toenails of both feet   2. Porokeratosis   3. Callus   4. Chemotherapy-induced neuropathy ([place])   5. Diabetes mellitus due to underlying condition with stage 3 chronic kidney disease, with long-term current use of insulin, unspecified whether stage 3a or 3b CKD ([place])      -Patient was evaluated and treated. All patient's and/or POA's questions/concerns answered on today's visit. -Mycotic toenails 1-5 bilaterally were debrided in length and girth with sterile nail nippers and dremel without incident. -Callus(es) 5th met base right lower extremity pared utilizing sterile scalpel blade without complication or incident. Total number debrided =1. -Porokeratotic lesion(s) plantar heel pad of left foot and submet head 4 b/l pared and enucleated with sterile scalpel blade without incident. Total number of lesions debrided=3. -Patient/POA to call should there be question/concern in the interim.   Return in about 3 months (around 02/02/2022).  Marzetta Board, DPM

## 2021-11-08 ENCOUNTER — Other Ambulatory Visit: Payer: Self-pay

## 2021-11-19 ENCOUNTER — Inpatient Hospital Stay: Payer: Medicare Other | Attending: Gynecology

## 2021-11-19 ENCOUNTER — Other Ambulatory Visit: Payer: Self-pay

## 2021-11-19 ENCOUNTER — Inpatient Hospital Stay: Payer: Medicare Other

## 2021-11-19 VITALS — BP 159/82 | HR 67 | Temp 98.3°F | Wt 148.0 lb

## 2021-11-19 DIAGNOSIS — Z7189 Other specified counseling: Secondary | ICD-10-CM

## 2021-11-19 DIAGNOSIS — C541 Malignant neoplasm of endometrium: Secondary | ICD-10-CM | POA: Diagnosis not present

## 2021-11-19 DIAGNOSIS — Z79899 Other long term (current) drug therapy: Secondary | ICD-10-CM | POA: Diagnosis not present

## 2021-11-19 DIAGNOSIS — I10 Essential (primary) hypertension: Secondary | ICD-10-CM

## 2021-11-19 DIAGNOSIS — C773 Secondary and unspecified malignant neoplasm of axilla and upper limb lymph nodes: Secondary | ICD-10-CM

## 2021-11-19 DIAGNOSIS — N183 Chronic kidney disease, stage 3 unspecified: Secondary | ICD-10-CM | POA: Insufficient documentation

## 2021-11-19 DIAGNOSIS — Z5112 Encounter for antineoplastic immunotherapy: Secondary | ICD-10-CM | POA: Insufficient documentation

## 2021-11-19 DIAGNOSIS — I129 Hypertensive chronic kidney disease with stage 1 through stage 4 chronic kidney disease, or unspecified chronic kidney disease: Secondary | ICD-10-CM | POA: Insufficient documentation

## 2021-11-19 DIAGNOSIS — E039 Hypothyroidism, unspecified: Secondary | ICD-10-CM

## 2021-11-19 LAB — CBC WITH DIFFERENTIAL/PLATELET
Abs Immature Granulocytes: 0.01 10*3/uL (ref 0.00–0.07)
Basophils Absolute: 0 10*3/uL (ref 0.0–0.1)
Basophils Relative: 1 %
Eosinophils Absolute: 0 10*3/uL (ref 0.0–0.5)
Eosinophils Relative: 1 %
HCT: 41.8 % (ref 36.0–46.0)
Hemoglobin: 13.6 g/dL (ref 12.0–15.0)
Immature Granulocytes: 0 %
Lymphocytes Relative: 34 %
Lymphs Abs: 1.4 10*3/uL (ref 0.7–4.0)
MCH: 29.3 pg (ref 26.0–34.0)
MCHC: 32.5 g/dL (ref 30.0–36.0)
MCV: 90.1 fL (ref 80.0–100.0)
Monocytes Absolute: 0.3 10*3/uL (ref 0.1–1.0)
Monocytes Relative: 6 %
Neutro Abs: 2.4 10*3/uL (ref 1.7–7.7)
Neutrophils Relative %: 58 %
Platelets: 162 10*3/uL (ref 150–400)
RBC: 4.64 MIL/uL (ref 3.87–5.11)
RDW: 13.7 % (ref 11.5–15.5)
WBC: 4.2 10*3/uL (ref 4.0–10.5)
nRBC: 0 % (ref 0.0–0.2)

## 2021-11-19 LAB — COMPREHENSIVE METABOLIC PANEL
ALT: 14 U/L (ref 0–44)
AST: 13 U/L — ABNORMAL LOW (ref 15–41)
Albumin: 3.6 g/dL (ref 3.5–5.0)
Alkaline Phosphatase: 61 U/L (ref 38–126)
Anion gap: 6 (ref 5–15)
BUN: 21 mg/dL (ref 8–23)
CO2: 22 mmol/L (ref 22–32)
Calcium: 8.4 mg/dL — ABNORMAL LOW (ref 8.9–10.3)
Chloride: 115 mmol/L — ABNORMAL HIGH (ref 98–111)
Creatinine, Ser: 1.1 mg/dL — ABNORMAL HIGH (ref 0.44–1.00)
GFR, Estimated: 54 mL/min — ABNORMAL LOW (ref >60)
Glucose, Bld: 117 mg/dL — ABNORMAL HIGH (ref 70–99)
Potassium: 3 mmol/L — ABNORMAL LOW (ref 3.5–5.1)
Sodium: 143 mmol/L (ref 135–145)
Total Bilirubin: 0.4 mg/dL (ref 0.3–1.2)
Total Protein: 6 g/dL — ABNORMAL LOW (ref 6.5–8.1)

## 2021-11-19 LAB — TSH: TSH: 0.926 u[IU]/mL (ref 0.350–4.500)

## 2021-11-19 LAB — TOTAL PROTEIN, URINE DIPSTICK: Protein, ur: 30 mg/dL — AB

## 2021-11-19 MED ORDER — SODIUM CHLORIDE 0.9% FLUSH
10.0000 mL | Freq: Once | INTRAVENOUS | Status: AC
  Administered 2021-11-19: 10 mL

## 2021-11-19 MED ORDER — SODIUM CHLORIDE 0.9% FLUSH
10.0000 mL | INTRAVENOUS | Status: DC | PRN
  Administered 2021-11-19: 10 mL

## 2021-11-19 MED ORDER — SODIUM CHLORIDE 0.9 % IV SOLN: Freq: Once | INTRAVENOUS | Status: AC

## 2021-11-19 MED ORDER — SODIUM CHLORIDE 0.9 % IV SOLN
200.0000 mg | Freq: Once | INTRAVENOUS | Status: AC
  Administered 2021-11-19: 200 mg via INTRAVENOUS
  Filled 2021-11-19: qty 200

## 2021-11-19 MED ORDER — HEPARIN SOD (PORK) LOCK FLUSH 100 UNIT/ML IV SOLN
500.0000 [IU] | Freq: Once | INTRAVENOUS | Status: AC | PRN
  Administered 2021-11-19: 500 [IU]

## 2021-11-19 NOTE — Progress Notes (Signed)
Encouraged to add high potassium foods to diet. She verbalized understanding.

## 2021-11-19 NOTE — Patient Instructions (Signed)
[facility] MEDICAL ONCOLOGY  Discharge Instructions: Thank you for choosing [facility] to provide your oncology and hematology care.   If you have a lab appointment with the Cancer Center, please go directly to the Cancer Center and check in at the registration area.   Wear comfortable clothing and clothing appropriate for easy access to any Portacath or PICC line.   We strive to give you quality time with your provider. You may need to reschedule your appointment if you arrive late (15 or more minutes).  Arriving late affects you and other patients whose appointments are after yours.  Also, if you miss three or more appointments without notifying the office, you may be dismissed from the clinic at the provider's discretion.      For prescription refill requests, have your pharmacy contact our office and allow 72 hours for refills to be completed.    Today you received the following chemotherapy and/or immunotherapy agents: Keytruda.       To help prevent nausea and vomiting after your treatment, we encourage you to take your nausea medication as directed.  BELOW ARE SYMPTOMS THAT SHOULD BE REPORTED IMMEDIATELY: *FEVER GREATER THAN 100.4 F (38 C) OR HIGHER *CHILLS OR SWEATING *NAUSEA AND VOMITING THAT IS NOT CONTROLLED WITH YOUR NAUSEA MEDICATION *UNUSUAL SHORTNESS OF BREATH *UNUSUAL BRUISING OR BLEEDING *URINARY PROBLEMS (pain or burning when urinating, or frequent urination) *BOWEL PROBLEMS (unusual diarrhea, constipation, pain near the anus) TENDERNESS IN MOUTH AND THROAT WITH OR WITHOUT PRESENCE OF ULCERS (sore throat, sores in mouth, or a toothache) UNUSUAL RASH, SWELLING OR PAIN  UNUSUAL VAGINAL DISCHARGE OR ITCHING   Items with * indicate a potential emergency and should be followed up as soon as possible or go to the Emergency Department if any problems should occur.  Please show the CHEMOTHERAPY ALERT CARD or IMMUNOTHERAPY ALERT CARD at check-in to  the Emergency Department and triage nurse.  Should you have questions after your visit or need to cancel or reschedule your appointment, please contact [facility]  Dept: [phone]  and follow the prompts.  Office hours are 8:00 a.m. to 4:30 p.m. Monday - Friday. Please note that voicemails left after 4:00 p.m. may not be returned until the following business day.  We are closed weekends and major holidays. You have access to a nurse at all times for urgent questions. Please call the main number to the clinic Dept: [phone] and follow the prompts.   For any non-urgent questions, you may also contact your provider using MyChart. We now offer e-Visits for anyone 18 and older to request care online for non-urgent symptoms. For details visit [email or web address].   Also download the MyChart app! Go to the app store, search "MyChart", open the app, select [facility], and log in with your MyChart username and password.  Masks are optional in the cancer centers. If you would like for your care team to wear a mask while they are taking care of you, please let them know. You may have one support person who is at least 71 years old accompany you for your appointments. 

## 2021-12-09 ENCOUNTER — Encounter: Payer: Medicare Other | Attending: Family Medicine | Admitting: Dietician

## 2021-12-09 DIAGNOSIS — E1122 Type 2 diabetes mellitus with diabetic chronic kidney disease: Secondary | ICD-10-CM | POA: Diagnosis not present

## 2021-12-09 DIAGNOSIS — N1831 Chronic kidney disease, stage 3a: Secondary | ICD-10-CM | POA: Diagnosis not present

## 2021-12-09 NOTE — Patient Instructions (Signed)
Aim to eat more balanced snacks (carb+protein) Examples -Apple and peanut butter -crackers and tuna or egg salad -yogurt and fruit -nuts and fruit  Choose low sodium food options Examples: -low sodium crackers -lightly or unsalted nuts -low sodium peanut butter  Make sure to eat every 3-5 hours  Consider going to an indoor track or doing home exercises until it is cooler outside.

## 2021-12-09 NOTE — Progress Notes (Signed)
Diabetes Self-Management Education  Visit Type: First/Initial  Appt. Start Time: 0915 Appt. End Time: 1000  12/09/2021  [name], identified by name and date of birth, is a 71 y.o. female with a diagnosis of Diabetes: Type 2.   ASSESSMENT Patient is here today alone. She states that she finds she gets too busy and wants to maintain a good schedule with nutrition. Pt states she has 2 main concerns. Her appetite is changing, so she feels as though she is less hungry and sometimes she won't eat if she isn't hungry. Her second concern is that sometimes she would eat too much fruit and it would make her get sick.   History includes:  type 2 diabetes, stage 3a CKD, HLD, HTN, endometrial cancer, arthritis, vitamin D deficiency Medications include:  levothyroxine Labs noted to include:  A1C 10/06/21 5.8%, creatinine 1.1, GFR 54,  Supplements: vitamin D, calcium tums) Fasting BG reported by pt: '86mg'$ /dL 7 day average fasting BG: '92mg'$ /dL  Wt History: Pt was seen by this RD in 2019 and was 182lbs. Pt states her weight was stable at around 156lbs, and now she is 147lbs. She does not want to lose anymore weight.   Patient lives alone and does her own shopping/cooking. 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  She states she used to walk 45-60 minutes 3-4x/wk but the summer heat has prevented this.   Height '5\' 6"'$  (1.676 m), weight 147 lb 6.4 oz (66.9 kg). Body mass index is 23.79 kg/m.   Diabetes Self-Management Education - 12/09/21 0924       Visit Information   Visit Type First/Initial      Initial Visit   Diabetes Type Type 2    Date Diagnosed 2005    Are you currently following a meal plan? Yes   follows MyPlate guidelines   What type of meal plan do you follow? MyPlate    Are you taking your medications as prescribed? Yes      Health Coping   How would you rate your overall health? Good       Psychosocial Assessment   Patient Belief/Attitude about Diabetes Motivated to manage diabetes    What is the hardest part about your diabetes right now, causing you the most concern, or is the most worrisome to you about your diabetes?   Making healty food and beverage choices    Self-care barriers None    Self-management support Doctor's office    Other persons present Patient    Patient Concerns Nutrition/Meal planning;Healthy Lifestyle    Special Needs None    Preferred Learning Style No preference indicated    Learning Readiness Ready    How often do you need to have someone help you when you read instructions, pamphlets, or other written materials from your doctor or pharmacy? 1 - Never    What is the last grade level you completed in school? masters degree      Pre-Education Assessment   Patient understands the diabetes disease and treatment process. Needs Review    Patient understands incorporating nutritional management into lifestyle. Needs Review    Patient undertands incorporating physical activity into lifestyle. Needs Review    Patient understands using medications safely. Needs Review    Patient understands monitoring blood glucose, interpreting and using results Needs Review    Patient understands prevention, detection, and treatment of acute complications. Needs Review    Patient understands prevention, detection,  and treatment of chronic complications. Needs Review    Patient understands how to develop strategies to address psychosocial issues. Needs Review    Patient understands how to develop strategies to promote health/change behavior. Needs Review      Complications   Last HgB A1C per patient/outside source 5.8 %   10/06/2021   How often do you check your blood sugar? 3-4 times/day    Fasting Blood glucose range (mg/dL) 70-129    Number of hypoglycemic episodes per month 0    Number of hyperglycemic episodes ( >'200mg'$ /dL): Never    Can you tell when your blood sugar  is high? Yes    Have you had a dilated eye exam in the past 12 months? Yes    Have you had a dental exam in the past 12 months? Yes    Are you checking your feet? Yes    How many days per week are you checking your feet? 7      Dietary Intake   Breakfast piece of fruit and egg and grits OR yogurt with a fruit or fruit juice and sausage    Lunch meatballs with yams and vegetable   at [employer] [facility])   Snack (afternoon) cheese-its OR peanuts OR crackers    Dinner Engineer, technical sales vegetable with rice and protein (chicken/shrimp or steak) OR fruit if not hungry    Beverage(s) water ~9c/day, flavored zero sugar water, sweet tea/lemonade rare      Activity / Exercise   Activity / Exercise Type Light (walking / raking leaves);ADL's    How many days per week do you exercise? --    How many minutes per day do you exercise? --    Total minutes per week of exercise --      Patient Education   Previous Diabetes Education Yes (please comment)    Disease Pathophysiology Explored patient's options for treatment of their diabetes    Healthy Eating Plate Method;Meal options for control of blood glucose level and chronic complications.    Being Active Role of exercise on diabetes management, blood pressure control and cardiac health.;Helped patient identify appropriate exercises in relation to his/her diabetes, diabetes complications and other health issue.    Monitoring Taught/evaluated SMBG meter.    Chronic complications Assessed and discussed foot care and prevention of foot problems    Diabetes Stress and Support Identified and addressed patients feelings and concerns about diabetes;Worked with patient to identify barriers to care and solutions      Individualized Goals (developed by patient)   Nutrition Follow meal plan discussed    Physical Activity Exercise 3-5 times per week;30 minutes per day    Medications Not Applicable    Monitoring  Test my blood glucose as discussed    Problem Solving  Eating Pattern    Reducing Risk do foot checks daily;examine blood glucose patterns      Post-Education Assessment   Patient understands the diabetes disease and treatment process. Demonstrates understanding / competency    Patient understands incorporating nutritional management into lifestyle. Demonstrates understanding / competency    Patient undertands incorporating physical activity into lifestyle. Comprehends key points    Patient understands using medications safely. Demonstrates understanding / competency    Patient understands monitoring blood glucose, interpreting and using results Demonstrates understanding / competency    Patient understands prevention, detection, and treatment of acute complications. Demonstrates understanding / competency    Patient understands prevention, detection, and treatment of chronic complications. Demonstrates understanding /  competency    Patient understands how to develop strategies to address psychosocial issues. Demonstrates understanding / competency    Patient understands how to develop strategies to promote health/change behavior. Demonstrates understanding / competency      Outcomes   Expected Outcomes Demonstrated interest in learning. Expect positive outcomes    Future DMSE PRN    Program Status Completed             Individualized Plan for Diabetes Self-Management Training:   Learning Objective:  Patient will have a greater understanding of diabetes self-management. Patient education plan is to attend individual and/or group sessions per assessed needs and concerns.   Plan:   Patient Instructions  Aim to eat more balanced snacks (carb+protein) Examples -Apple and peanut butter -crackers and tuna or egg salad -yogurt and fruit -nuts and fruit  Choose low sodium food options Examples: -low sodium crackers -lightly or unsalted nuts -low sodium peanut butter  Make sure to eat every 3-5 hours  Consider going to an indoor  track or doing home exercises until it is cooler outside.  Expected Outcomes:  Demonstrated interest in learning. Expect positive outcomes  Education material provided: My Plate, Snack sheet, and Diabetes Resources  If problems or questions, patient to contact team via:  Phone  Future DSME appointment: PRN

## 2021-12-10 ENCOUNTER — Encounter: Payer: Self-pay | Admitting: Hematology and Oncology

## 2021-12-10 ENCOUNTER — Other Ambulatory Visit: Payer: Self-pay

## 2021-12-10 ENCOUNTER — Inpatient Hospital Stay: Payer: Medicare Other

## 2021-12-10 ENCOUNTER — Inpatient Hospital Stay (HOSPITAL_BASED_OUTPATIENT_CLINIC_OR_DEPARTMENT_OTHER): Payer: Medicare Other | Admitting: Hematology and Oncology

## 2021-12-10 VITALS — BP 158/95 | HR 75 | Temp 98.1°F | Resp 18

## 2021-12-10 DIAGNOSIS — I129 Hypertensive chronic kidney disease with stage 1 through stage 4 chronic kidney disease, or unspecified chronic kidney disease: Secondary | ICD-10-CM | POA: Diagnosis not present

## 2021-12-10 DIAGNOSIS — C541 Malignant neoplasm of endometrium: Secondary | ICD-10-CM

## 2021-12-10 DIAGNOSIS — I10 Essential (primary) hypertension: Secondary | ICD-10-CM

## 2021-12-10 DIAGNOSIS — N183 Chronic kidney disease, stage 3 unspecified: Secondary | ICD-10-CM | POA: Diagnosis not present

## 2021-12-10 DIAGNOSIS — C773 Secondary and unspecified malignant neoplasm of axilla and upper limb lymph nodes: Secondary | ICD-10-CM | POA: Diagnosis not present

## 2021-12-10 DIAGNOSIS — Z7189 Other specified counseling: Secondary | ICD-10-CM

## 2021-12-10 DIAGNOSIS — Z79899 Other long term (current) drug therapy: Secondary | ICD-10-CM | POA: Diagnosis not present

## 2021-12-10 DIAGNOSIS — Z5112 Encounter for antineoplastic immunotherapy: Secondary | ICD-10-CM | POA: Diagnosis not present

## 2021-12-10 LAB — CBC WITH DIFFERENTIAL/PLATELET
Abs Immature Granulocytes: 0.01 10*3/uL (ref 0.00–0.07)
Basophils Absolute: 0 10*3/uL (ref 0.0–0.1)
Basophils Relative: 0 %
Eosinophils Absolute: 0 10*3/uL (ref 0.0–0.5)
Eosinophils Relative: 1 %
HCT: 41.7 % (ref 36.0–46.0)
Hemoglobin: 13.7 g/dL (ref 12.0–15.0)
Immature Granulocytes: 0 %
Lymphocytes Relative: 34 %
Lymphs Abs: 1.4 10*3/uL (ref 0.7–4.0)
MCH: 29.3 pg (ref 26.0–34.0)
MCHC: 32.9 g/dL (ref 30.0–36.0)
MCV: 89.1 fL (ref 80.0–100.0)
Monocytes Absolute: 0.3 10*3/uL (ref 0.1–1.0)
Monocytes Relative: 6 %
Neutro Abs: 2.5 10*3/uL (ref 1.7–7.7)
Neutrophils Relative %: 59 %
Platelets: 171 10*3/uL (ref 150–400)
RBC: 4.68 MIL/uL (ref 3.87–5.11)
RDW: 13.4 % (ref 11.5–15.5)
WBC: 4.2 10*3/uL (ref 4.0–10.5)
nRBC: 0 % (ref 0.0–0.2)

## 2021-12-10 LAB — COMPREHENSIVE METABOLIC PANEL
ALT: 14 U/L (ref 0–44)
AST: 14 U/L — ABNORMAL LOW (ref 15–41)
Albumin: 3.6 g/dL (ref 3.5–5.0)
Alkaline Phosphatase: 64 U/L (ref 38–126)
Anion gap: 5 (ref 5–15)
BUN: 18 mg/dL (ref 8–23)
CO2: 23 mmol/L (ref 22–32)
Calcium: 8.4 mg/dL — ABNORMAL LOW (ref 8.9–10.3)
Chloride: 115 mmol/L — ABNORMAL HIGH (ref 98–111)
Creatinine, Ser: 1.05 mg/dL — ABNORMAL HIGH (ref 0.44–1.00)
GFR, Estimated: 57 mL/min — ABNORMAL LOW (ref >60)
Glucose, Bld: 102 mg/dL — ABNORMAL HIGH (ref 70–99)
Potassium: 3.4 mmol/L — ABNORMAL LOW (ref 3.5–5.1)
Sodium: 143 mmol/L (ref 135–145)
Total Bilirubin: 0.4 mg/dL (ref 0.3–1.2)
Total Protein: 5.9 g/dL — ABNORMAL LOW (ref 6.5–8.1)

## 2021-12-10 LAB — TOTAL PROTEIN, URINE DIPSTICK: Protein, ur: 30 mg/dL — AB

## 2021-12-10 MED ORDER — SODIUM CHLORIDE 0.9 % IV SOLN
200.0000 mg | Freq: Once | INTRAVENOUS | Status: AC
  Administered 2021-12-10: 200 mg via INTRAVENOUS
  Filled 2021-12-10: qty 8

## 2021-12-10 MED ORDER — LIDOCAINE-PRILOCAINE 2.5-2.5 % EX CREA: TOPICAL_CREAM | Freq: Every day | CUTANEOUS | 3 refills | Status: DC | PRN

## 2021-12-10 MED ORDER — SODIUM CHLORIDE 0.9% FLUSH
10.0000 mL | Freq: Once | INTRAVENOUS | Status: AC
  Administered 2021-12-10: 10 mL

## 2021-12-10 MED ORDER — SODIUM CHLORIDE 0.9% FLUSH
10.0000 mL | INTRAVENOUS | Status: DC | PRN
  Administered 2021-12-10: 10 mL

## 2021-12-10 MED ORDER — SODIUM CHLORIDE 0.9 % IV SOLN: Freq: Once | INTRAVENOUS | Status: AC

## 2021-12-10 MED ORDER — HEPARIN SOD (PORK) LOCK FLUSH 100 UNIT/ML IV SOLN
500.0000 [IU] | Freq: Once | INTRAVENOUS | Status: AC | PRN
  Administered 2021-12-10: 500 [IU]

## 2021-12-10 NOTE — Patient Instructions (Signed)
[facility]ICAL [facility]COLOG[facility]   Discharge Instructions: Thank you for choosing [facility] to provide your oncology and hematology care.   If you have a lab appointment with the Cancer Center, please go directly to the Cancer Center and check in at the registration area.   Wear comfortable clothing and clothing appropriate for easy access to any Portacath or PICC line.   We strive to give you quality time with your provider. You may need to reschedule your appointment if you arrive late (15 or more minutes).  Arriving late affects you and other patients whose appointments are after yours.  Also, if you miss three or more appointments without notifying the office, you may be dismissed from the clinic at the provider's discretion.      For prescription refill requests, have your pharmacy contact our office and allow 72 hours for refills to be completed.    Today you received the following chemotherapy and/or immunotherapy agents: Pembrolizumab (Keytruda)      To help prevent nausea and vomiting after your treatment, we encourage you to take your nausea medication as directed.  BELOW ARE SYMPTOMS THAT SHOULD BE REPORTED IMMEDIATELY: *FEVER GREATER THAN 100.4 F (38 C) OR HIGHER *CHILLS OR SWEATING *NAUSEA AND VOMITING THAT IS NOT CONTROLLED WITH YOUR NAUSEA MEDICATION *UNUSUAL SHORTNESS OF BREATH *UNUSUAL BRUISING OR BLEEDING *URINARY PROBLEMS (pain or burning when urinating, or frequent urination) *BOWEL PROBLEMS (unusual diarrhea, constipation, pain near the anus) TENDERNESS IN MOUTH AND THROAT WITH OR WITHOUT PRESENCE OF ULCERS (sore throat, sores in mouth, or a toothache) UNUSUAL RASH, SWELLING OR PAIN  UNUSUAL VAGINAL DISCHARGE OR ITCHING   Items with * indicate a potential emergency and should be followed up as soon as possible or go to the Emergency Department if any problems should occur.  Please show the CHEMOTHERAPY ALERT CARD or IMMUNOTHERAPY ALERT  CARD at check-in to the Emergency Department and triage nurse.  Should you have questions after your visit or need to cancel or reschedule your appointment, please contact [facility]  Dept: [phone]  and follow the prompts.  Office hours are 8:00 a.m. to 4:30 p.m. Monday - Friday. Please note that voicemails left after 4:00 p.m. may not be returned until the following business day.  We are closed weekends and major holidays. You have access to a nurse at all times for urgent questions. Please call the main number to the clinic Dept: [phone] and follow the prompts.   For any non-urgent questions, you may also contact your provider using MyChart. We now offer e-Visits for anyone 18 and older to request care online for non-urgent symptoms. For details visit [email or web address].   Also download the MyChart app! Go to the app store, search "MyChart", open the app, select [facility], and log in with your MyChart username and password.  Masks are optional in the cancer centers. If you would like for your care team to wear a mask while they are taking care of you, please let them know. You may have one support person who is at least 71 years old accompany you for your appointments. 

## 2021-12-10 NOTE — Progress Notes (Signed)
[facility] OFFICE PROGRESS NOTE  Patient Care Team: Wolters, Chaunda, MD as PCP - General (Family Medicine)  ASSESSMENT & PLAN:  Endometrial cancer (HCC) Recent multiple imaging studies showed persistent lymphadenopathy in the right axilla, stable Overall, while Krista Johnson does not have complete response, combination of pembrolizumab and lenvatinib is controlling her disease Krista Johnson will continue this treatment indefinitely I plan to repeat imaging study again in December  Essential hypertension Her documented blood pressure here is high even though according to the patient, at home, her blood pressure is normal in the 120s Krista Johnson has history of mild proteinuria Krista Johnson will continue current blood pressure management  Chronic kidney disease (CKD), stage III (moderate) (HCC) Recent renal function fluctuates up and down We discussed the importance of aggressive risk factor modification and to drink plenty of oral fluids  No orders of the defined types were placed in this encounter.   All questions were answered. The patient knows to call the clinic with any problems, questions or concerns. The total time spent in the appointment was 25 minutes encounter with patients including review of chart and various tests results, discussions about plan of care and coordination of care plan   Ni Gorsuch, MD 12/10/2021 8:53 AM  INTERVAL HISTORY: Please see below for problem oriented charting. Krista Johnson returns for treatment follow-up on Lenvima and Keytruda According to the patient, her blood pressure at home were normal Krista Johnson denies symptoms of headache or blurriness of vision from high blood pressure  REVIEW OF SYSTEMS:   Constitutional: Denies fevers, chills or abnormal weight loss Eyes: Denies blurriness of vision Ears, nose, mouth, throat, and face: Denies mucositis or sore throat Respiratory: Denies cough, dyspnea or wheezes Cardiovascular: Denies palpitation, chest discomfort or lower extremity  swelling Gastrointestinal:  Denies nausea, heartburn or change in bowel habits Skin: Denies abnormal skin rashes Lymphatics: Denies new lymphadenopathy or easy bruising Neurological:Denies numbness, tingling or new weaknesses Behavioral/Psych: Mood is stable, no new changes  All other systems were reviewed with the patient and are negative.  I have reviewed the past medical history, past surgical history, social history and family history with the patient and they are unchanged from previous note.  ALLERGIES:  is allergic to sulfa antibiotics and sulfamethoxazole.  MEDICATIONS:  Current Outpatient Medications  Medication Sig Dispense Refill   amLODipine-valsartan (EXFORGE) 5-160 MG tablet Take 1 tablet by mouth daily.     atenolol (TENORMIN) 25 MG tablet Take 1 tablet (25 mg total) by mouth daily. 90 tablet 1   Azelastine HCl 137 MCG/SPRAY SOLN Place into both nostrils.     Calcium Carb-Cholecalciferol 600-800 MG-UNIT TABS Take 1 tablet by mouth daily. (Patient not taking: Reported on 12/09/2021)     calcium carbonate (TUMS - DOSED IN MG ELEMENTAL CALCIUM) 500 MG chewable tablet Chew 2 tablets by mouth 2 (two) times daily.     cholecalciferol (VITAMIN D3) 25 MCG (1000 UNIT) tablet Take 5,000 Units by mouth daily.     ergocalciferol (VITAMIN D2) 1.25 MG (50000 UT) capsule Take 1 capsule (50,000 Units total) by mouth once a week. 25 capsule 1   glucose blood (ONETOUCH VERIO) test strip Use as instructed to check blood sugars 3 times a day. 100 each 3   lenvatinib 10 mg daily dose (LENVIMA, 10 MG DAILY DOSE,) capsule Take 1 capsule (10 mg total) by mouth daily. 30 capsule 11   levothyroxine (EUTHYROX) 200 MCG tablet Take 1 tablet (200 mcg total) by mouth daily before breakfast. 90 tablet 3     levothyroxine (EUTHYROX) 50 MCG tablet Take 1 tablet (50 mcg total) by mouth daily before breakfast. 90 tablet 3   lidocaine-prilocaine (EMLA) cream Apply topically daily as needed. 30 g 3   OneTouch  Delica Lancets 33G MISC 1 each by Other route 2 (two) times daily. Use to monitor glucose levels BID; E11.65 100 each 2   RELION PEN NEEDLES 32G X 4 MM MISC USE 1 PEN PER DAY (Patient not taking: Reported on 08/27/2021) 50 each 5   simvastatin (ZOCOR) 10 MG tablet Take 10 mg by mouth at bedtime.      No current facility-administered medications for this visit.   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 Haygood who obtained an endometrial biopsy showing poorly differentiated endometrial carcinoma and negative endocervical curettage   12/12/2017 Imaging   MAMMOGRAM FINDINGS: In the right axilla, a possible mass warrants further evaluation. In the left breast, no findings suspicious for malignancy.   Images were processed with CAD.   IMPRESSION: Further evaluation is suggested for possible mass in the right axilla.     12/24/2017 Imaging   Ct scan chest, abdomen and pelvis 1. Marked thickening of the endometrium (42 mm) compatible with known primary endometrial malignancy. No evidence of extrauterine invasion. 2. No pelvic or retroperitoneal adenopathy. 3. Right middle lobe 4 mm solid pulmonary nodule, for which  follow-up chest CT is advised in 3-6 months. 4. Several findings that are equivocal for distant metastatic disease. Vaguely nodular heterogeneous hyperenhancement in the peripheral right liver lobe, which could represent benign transient perfusional phenomena, with underlying liver lesions not entirely excluded. Mildly sclerotic T12 vertebral lesion. Mildly enlarged right axillary lymph node. The best single test to further evaluate these findings would be a PET-CT. Alternative tests include bone scan or thoracic MRI without and with IV contrast for the T12 osseous lesion, MRI abdomen without and with IV contrast for the liver findings, and diagnostic mammographic evaluation for the right axillary node. 5.  Aortic Atherosclerosis (ICD10-I70.0).     01/09/2018 PET scan   1. Moderate hypermetabolism corresponding to enlarging axillary node since 12/22/2017. Highly suspicious for an atypical distribution of metastatic disease. 2. No hypermetabolism to suggest hepatic or T12 osseous metastasis. 3. Hypermetabolic endometrial primary.   01/23/2018 Initial Diagnosis   Endometrial cancer (HCC)   01/23/2018 Pathology Results   1. Lymph node, sentinel, biopsy, left external iliac - NO CARCINOMA IDENTIFIED IN ONE LYMPH NODE (0/1) - SEE COMMENT 2. Lymph nodes, regional resection, right para aortic - NO CARCINOMA IDENTIFIED IN FOUR LYMPH NODES (0/4) - SEE COMMENT 3. Lymph nodes, regional resection, right pelvic -   NO CARCINOMA IDENTIFIED IN EIGHT LYMPH NODES (0/8) - SEE COMMENT 4. Cul-de-sac biopsy - METASTATIC CARCINOMA 5. Uterus +/- tubes/ovaries, neoplastic, cervix, bilateral fallopian tubes and ovaries UTERUS: - MIXED SEROUS AND ENDOMETRIOID CARCINOMA - SEROSAL IMPLANTS PRESENT - LYMPHOVASCULAR SPACE INVASION PRESENT - LEIOMYOMATA (1.5 CM; LARGEST) - SEE ONCOLOGY TABLE AND COMMENT BELOW CERVIX: - BENIGN NABOTHIAN CYSTS - NO CARCINOMA IDENTIFIED BILATERAL OVARIES: - METASTATIC CARCINOMA PRESENT  ON OVARIAN SURFACE BILATERAL FALLOPIAN TUBES: - INTRALUMINAL CARCINOMA Microscopic Comment 1. -3. Cytokeratin AE1/3 was performed on the sentinel lymph nodes to exclude micrometastasis. There is no evidence of metastatic carcinoma by immunohistochemistry. 5. 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%) Histologic Grade: N/A Myometrial invasion: Estimated less than 50% myometrial invasion (0.3 cm of myometrium involved; 1.4 cm measured thickness) Uterine Serosa Involvement: Present Cervical stromal involvement: Not identified Extent of involvement of other organs: - Fallopian tube (left within the lumen) - Ovary, left (surface involvement) - Cul-de-sac Lymphovascular invasion: Present Regional Lymph Nodes: Examined: 1 Sentinel 12 Non-sentinel 13 Total Lymph nodes with metastasis: 0 Isolated tumor cells (< 0.2 mm): 0 Micrometastasis: (> 0.2 mm and < 2.0 mm): 0 Macrometastasis: (> 2.0 mm): 0 Extracapsular extension: N/A Tumor block for ancillary studies: 5E, 5B MMR / MSI testing: Pending will be reported separately Pathologic Stage Classification (pTNM, AJCC 8th edition): pT3a, pN0 FIGO Stage: IIIA COMMENT: There is tumor present on the surface of the left ovary and within the lumen of the left fallopian tube. The carcinoma appears to be mixed with the largest component being high grade serous carcinoma. Dr. Lyndon Code reviewed the case and agrees with the above diagnosis.   01/23/2018 Surgery   Surgeon: Donaciano Eva     Operation: Robotic-assisted laparoscopic total hysterectomy with bilateral salpingoophorectomy, SLN injection, mapping and biopsy, right pelvic and para-aortic lymphadenectomy   Operative Findings:  : 10-12cm bulky uterus with frank  serosal involvement on posterior cul de sac peritoneum and anterior peritoneum which adhesed the bladder to the anterior uterus. Unilateral mapping on left pelvis. No grossly suspicious nodes. Normal omentum and diaphragms.    02/01/2018 Pathology Results   Lymph node, needle/core biopsy, right axilla - METASTATIC CARCINOMA - SEE COMMENT Microscopic Comment The neoplastic cells are positive for cytokeratin 7 and Pax-8 but negative for cytokeratin 5/6, cytokeratin 20, Gata-3, p63, p53 and GCDFP. Overall, the immunoprofile is consistent with metastasis from the patient's known gynecologic carcinoma.   02/01/2018 Procedure   Ultrasound-guided core biopsies of a suspicious right axillary lymph node.   02/21/2018 - 06/13/2018 Chemotherapy   The patient had carboplatin & Taxol x 6   03/26/2018 - 04/30/2018 Radiation Therapy   Radiation treatment dates:   03/26/18, 04/02/18, 04/19/18, 04/23/18 04/30/18   Site/dose: proximal Vagina, 6 Gy in 5 fractions for a total dose of 30 Gy     04/26/2018 PET scan   1. Interval resection of the hypermetabolic endometrial primary. No discernible hypermetabolic pelvic sidewall or peritoneal metastases. 2. Stable hypermetabolic bilateral axillary lymphadenopathy. The degree of hypermetabolism in these lymph nodes remains highly suspicious for neoplasm. 3. Interval development of low level FDG uptake in 2 stable, small lymph nodes in the left groin region. This may be reactive, but close attention recommended to exclude metastatic involvement. 4. Stable non hypermetabolic low-density liver lesions and the mixed lucent and sclerotic T12 lesion is stable without  hypermetabolism today.   07/09/2018 Imaging   Status post hysterectomy.   Mild axillary lymphadenopathy, improved. Nodal metastases not excluded.   Irregular wall thickening involving the anterior bladder, possibly reflecting radiation cystitis versus tumor.   Additional stable findings as above, including a 5  mm right middle lobe nodule and stable sclerosis involving the T12 vertebral body.   10/10/2018 Imaging   1. Stable exam. No findings within the abdomen or pelvis to suggest metastatic disease. 2. Unchanged appearance of sclerosis involving the T12 vertebra. 3.  Aortic Atherosclerosis (ICD10-I70.0).   10/10/2018 Imaging   Ct abdomen and pelvis 1. Stable exam. No findings within the abdomen or pelvis to suggest metastatic disease. 2. Unchanged appearance of sclerosis involving the T12 vertebra. 3.  Aortic Atherosclerosis (ICD10-I70.0).   01/16/2019 PET scan   1. Enlargement and increased activity in the left axillary, right axillary, and left inguinal adenopathy compatible with progressive malignancy. 2. Stable 4 mm right middle lobe subpleural nodule, not appreciably hypermetabolic. 3. Other imaging findings of potential clinical significance: Right kidney lower pole cyst. Aortic Atherosclerosis (ICD10-I70.0). Prominent stool throughout the colon favors constipation. Mild chronic left maxillary sinusitis.     02/01/2019 -  Chemotherapy   The patient had pembrolizumab and Lenvima for chemotherapy treatment.     04/25/2019 Imaging   Ct imaging 1. Interval response to therapy. Decrease in size of bilateral axillary and left inguinal lymph nodes. No new or progressive findings identified. 2. Stable sclerotic appearance of the T12 vertebra. 3. Aortic atherosclerosis. Lad coronary artery calcification noted.   08/30/2019 Imaging   1. Bulky RIGHT hilar lymph node, slightly increased in size from previous imaging. 2. Multiple foci of hyperenhancement in the liver. 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 Consider MRI liver on follow-up to establish a baseline for number of lesions as these will appear  variable on CT follow-up based on phase of contrast acquisition in the future. 3. Stable 5 mm nodule adjacent to the fissure, minor fissure in the RIGHT middle lobe.     11/28/2019 Imaging   1. Stable exam. No new or progressive findings. 2. Stable enlarged right axillary lymph node. 3. Stable tiny bilateral pulmonary nodules. Continued attention on follow-up recommended. 4. Scattered hypodensities in the liver parenchyma correspond to the hypervascular lesions seen on the previous study performed with intravenous contrast material. 5. Right renal cyst. 6. Aortic Atherosclerosis (ICD10-I70.0).     03/16/2020 Imaging   Increased echogenicity of renal parenchyma is noted bilaterally suggesting medical renal disease. No hydronephrosis or renal obstruction is noted   04/02/2020 Imaging   1. Right axillary lymphadenopathy is stable. Tiny bilateral pulmonary nodules are stable. Subcentimeter low-attenuation liver lesions are stable. 2. No new or progressive metastatic disease in the chest, abdomen or pelvis. 3. Aortic Atherosclerosis (ICD10-I70.0).   09/15/2020 Imaging   1. Stable RIGHT axillary lymph node with enlargement measuring 2 cm short axis. 2. Stable small nodule along the minor fissure in the RIGHT chest. 3. No new or progressive findings. 4. Aortic atherosclerosis.   03/24/2021 Imaging   1. Right axillary lymphadenopathy is stable compared to prior studies. 2. No other definite signs of metastatic disease elsewhere in the thorax. 3. Hepatic steatosis. Numerous hypervascular areas noted in the liver, incompletely characterized on today's arterial phase examination. These   are similar to remote prior examination from 08/30/2019, likely to represent small benign lesions such as flash fill cavernous hemangiomas. These could be definitively evaluated with nonemergent abdominal MRI with and without IV gadolinium if of clinical concern. 4. Aortic atherosclerosis, in addition to left main and  left anterior descending coronary artery disease. Assessment for potential risk factor modification, dietary therapy or pharmacologic therapy may be warranted, if clinically indicated.   Aortic Atherosclerosis (ICD10-I70.0).   09/16/2021 Imaging   1. Unchanged enlarged right axillary lymph node. No evidence of new lymphadenopathy or metastatic disease in the chest, abdomen, or pelvis. 2. Subcentimeter hyperenhancing lesions of the right lobe of the liver, hepatic segments VII and VIII are unchanged, measuring up to 0.7 cm. These are likely small benign incidental flash filling hemangiomata although incompletely characterized on this examination. Attention on follow-up. 3. Status post hysterectomy. 4. Cholelithiasis. 5. Coronary artery disease.   Aortic Atherosclerosis (ICD10-I70.0).       PHYSICAL EXAMINATION: ECOG PERFORMANCE STATUS: 1 - Symptomatic but completely ambulatory  Vitals:   12/10/21 0804  BP: (!) 161/98  Pulse: 81  Resp: 18  SpO2: 100%   Filed Weights   12/10/21 0804  Weight: 148 lb 9.6 oz (67.4 kg)    GENERAL:alert, no distress and comfortable NEURO: alert & oriented x 3 with fluent speech, no focal motor/sensory deficits  LABORATORY DATA:  I have reviewed the data as listed    Component Value Date/Time   NA 143 12/10/2021 0755   K 3.4 (L) 12/10/2021 0755   CL 115 (H) 12/10/2021 0755   CO2 23 12/10/2021 0755   GLUCOSE 102 (H) 12/10/2021 0755   BUN 18 12/10/2021 0755   CREATININE 1.05 (H) 12/10/2021 0755   CREATININE 1.75 (H) 01/10/2020 1200   CALCIUM 8.4 (L) 12/10/2021 0755   PROT 5.9 (L) 12/10/2021 0755   ALBUMIN 3.6 12/10/2021 0755   AST 14 (L) 12/10/2021 0755   AST 13 (L) 01/10/2020 1200   ALT 14 12/10/2021 0755   ALT 11 01/10/2020 1200   ALKPHOS 64 12/10/2021 0755   BILITOT 0.4 12/10/2021 0755   BILITOT 0.4 01/10/2020 1200   GFRNONAA 57 (L) 12/10/2021 0755   GFRNONAA 29 (L) 01/10/2020 1200   GFRAA 34 (L) 01/10/2020 1200    No results found  for: "SPEP", "UPEP"  Lab Results  Component Value Date   WBC 4.2 12/10/2021   NEUTROABS 2.5 12/10/2021   HGB 13.7 12/10/2021   HCT 41.7 12/10/2021   MCV 89.1 12/10/2021   PLT 171 12/10/2021      Chemistry      Component Value Date/Time   NA 143 12/10/2021 0755   K 3.4 (L) 12/10/2021 0755   CL 115 (H) 12/10/2021 0755   CO2 23 12/10/2021 0755   BUN 18 12/10/2021 0755   CREATININE 1.05 (H) 12/10/2021 0755   CREATININE 1.75 (H) 01/10/2020 1200      Component Value Date/Time   CALCIUM 8.4 (L) 12/10/2021 0755   ALKPHOS 64 12/10/2021 0755   AST 14 (L) 12/10/2021 0755   AST 13 (L) 01/10/2020 1200   ALT 14 12/10/2021 0755   ALT 11 01/10/2020 1200   BILITOT 0.4 12/10/2021 0755   BILITOT 0.4 01/10/2020 1200      

## 2021-12-10 NOTE — Assessment & Plan Note (Signed)
Recent multiple imaging studies showed persistent lymphadenopathy in the right axilla, stable Overall, while she does not have complete response, combination of pembrolizumab and lenvatinib is controlling her disease She will continue this treatment indefinitely I plan to repeat imaging study again in December

## 2021-12-10 NOTE — Assessment & Plan Note (Signed)
Recent renal function fluctuates up and down We discussed the importance of aggressive risk factor modification and to drink plenty of oral fluids

## 2021-12-10 NOTE — Assessment & Plan Note (Signed)
Her documented blood pressure here is high even though according to the patient, at home, her blood pressure is normal in the 120s She has history of mild proteinuria She will continue current blood pressure management 

## 2021-12-13 ENCOUNTER — Telehealth: Payer: Self-pay | Admitting: Hematology and Oncology

## 2021-12-13 NOTE — Telephone Encounter (Signed)
Scheduled appointment per 8/28 los. Patient is aware.

## 2021-12-21 ENCOUNTER — Other Ambulatory Visit: Payer: Self-pay | Admitting: Hematology and Oncology

## 2021-12-21 DIAGNOSIS — C541 Malignant neoplasm of endometrium: Secondary | ICD-10-CM

## 2021-12-21 DIAGNOSIS — Z7189 Other specified counseling: Secondary | ICD-10-CM

## 2021-12-21 NOTE — Progress Notes (Signed)
OFF PATHWAY REGIMEN - Uterine  No Change  Continue With Treatment as Ordered.  Original Decision Date/Time: 01/24/2019 08:34   [id number]:Lenvatinib 20 mg PO Daily D1-21 + Pembrolizumab 200 mg IV D1 q21 Days:   A cycle is every 21 days:     Lenvatinib      Pembrolizumab   **Always confirm dose/schedule in your pharmacy ordering system**  Patient Characteristics: Papillary Serous and Clear Cell Histology, Recurrent/Progressive Disease, Second Line, Relapse ? 6 Months From Prior Therapy Histology: Papillary Serous and Clear Cell Histology Therapeutic Status: Recurrent or Progressive Disease AJCC T Category: T3 AJCC N Category: N0 AJCC M Category: M1 AJCC 8 Stage Grouping: IVB Line of Therapy: Second Line Time to Recurrence: Relapse ? 6 Months From Prior Therapy Intent of Therapy: Non-Curative / Palliative Intent, Discussed with Patient

## 2021-12-22 ENCOUNTER — Other Ambulatory Visit: Payer: Self-pay

## 2021-12-27 NOTE — Progress Notes (Unsigned)
Patient ID: Krista Johnson, female   DOB: 1950-06-18, 71 y.o.   MRN: [id number]           Reason for Appointment: Type II Diabetes follow-up    History of Present Illness   Diagnosis date: 2004  Previous history:   Oral hypoglycemic drugs previously used are: Metformin, glipizide, Trulicity Not clear why metformin was stopped in 2019, may have been when insulin was started Insulin was started in 2019 and subsequently stopped in 2022 when she lost weight  A1c range in the last few years is: 5.8-14.9  Recent history:     Non-insulin hypoglycemic drugs: None     Insulin regimen: None            Side effects from medications: None  Current self management, blood sugar patterns and problems identified:  A1c is continuing to be well controlled and now 5.8 She has excellent blood sugars at home and checking up to 3 times a day In the past she had lost significant amount of weight from walking up to 4 miles a day and a better diet, with this she had stopped insulin Lab glucose was 84 and last month 88  Exercise: 45 min, less now  Diet management: She is usually watching her calories and high fat meals      Monitors blood glucose: Once a day.    Glucometer: One Touch.           Blood Glucose readings from meter review: Range 78-137 at different times, mostly before meals at AVERAGE 95    Weight control: 250  Wt Readings from Last 3 Encounters:  12/10/21 148 lb 9.6 oz (67.4 kg)  12/09/21 147 lb 6.4 oz (66.9 kg)  11/19/21 148 lb (67.1 kg)            Diabetes labs:  Lab Results  Component Value Date   HGBA1C 5.8 (A) 10/06/2021   HGBA1C 6.2 (A) 05/27/2021   HGBA1C 5.8 (A) 01/21/2021   Lab Results  Component Value Date   LDLCALC 89 01/01/2021   CREATININE 1.05 (H) 12/10/2021     Allergies as of 12/28/2021       Reactions   Sulfa Antibiotics Nausea Only   Sulfamethoxazole Nausea Only        Medication List        Accurate as of December 27, 2021  8:36  PM. If you have any questions, ask your nurse or doctor.          amLODipine-valsartan 5-160 MG tablet Commonly known as: EXFORGE Take 1 tablet by mouth daily.   atenolol 25 MG tablet Commonly known as: TENORMIN Take 1 tablet (25 mg total) by mouth daily.   Azelastine HCl 137 MCG/SPRAY Soln Place into both nostrils.   Calcium Carb-Cholecalciferol 600-800 MG-UNIT Tabs Take 1 tablet by mouth daily.   calcium carbonate 500 MG chewable tablet Commonly known as: TUMS - dosed in mg elemental calcium Chew 2 tablets by mouth 2 (two) times daily.   cholecalciferol 25 MCG (1000 UNIT) tablet Commonly known as: VITAMIN D3 Take 5,000 Units by mouth daily.   ergocalciferol 1.25 MG (50000 UT) capsule Commonly known as: VITAMIN D2 Take 1 capsule (50,000 Units total) by mouth once a week.   Lenvima (10 MG Daily Dose) capsule Generic drug: lenvatinib 10 mg daily dose Take 1 capsule (10 mg total) by mouth daily.   levothyroxine 200 MCG tablet Commonly known as: Euthyrox Take 1 tablet (200 mcg total) by mouth daily  before breakfast.   levothyroxine 50 MCG tablet Commonly known as: Euthyrox Take 1 tablet (50 mcg total) by mouth daily before breakfast.   lidocaine-prilocaine cream Commonly known as: EMLA Apply topically daily as needed.   OneTouch Delica Lancets 76H Misc 1 each by Other route 2 (two) times daily. Use to monitor glucose levels BID; E11.65   OneTouch Verio test strip Generic drug: glucose blood Use as instructed to check blood sugars 3 times a day.   ReliOn Pen Needles 32G X 4 MM Misc Generic drug: Insulin Pen Needle USE 1 PEN PER DAY   simvastatin 10 MG tablet Commonly known as: ZOCOR Take 10 mg by mouth at bedtime.        Allergies:  Allergies  Allergen Reactions   Sulfa Antibiotics Nausea Only   Sulfamethoxazole Nausea Only    Past Medical History:  Diagnosis Date   Arthritis    right knee--- last cortisone injection 04/ 2019   CKD (chronic  kidney disease)    Diabetes mellitus without complication [facility])    endocrinologist-  dr Loanne Drilling   Endometrial cancer [facility])    History of colon polyps    Hyperlipidemia    Hyperparathyroidism ([place])    Hypertension     Past Surgical History:  Procedure Laterality Date   COLONOSCOPY  last one 12-25-2017   IR IMAGING GUIDED PORT INSERTION  02/20/2018   ROBOTIC ASSISTED TOTAL HYSTERECTOMY WITH BILATERAL SALPINGO OOPHERECTOMY N/A 01/23/2018   Procedure: XI ROBOTIC ASSISTED TOTAL HYSTERECTOMY WITH BILATERAL SALPINGO OOPHORECTOMY;  Surgeon: Everitt Amber, MD;  Location: [facility];  Service: Gynecology;  Laterality: N/A;   SENTINEL NODE BIOPSY N/A 01/23/2018   Procedure: SENTINEL NODE BIOPSY;  Surgeon: Everitt Amber, MD;  Location: [facility];  Service: Gynecology;  Laterality: N/A;    Family History  Problem Relation Age of Onset   Diabetes Mother    Hypertension Mother    Diabetes Sister    Hypertension Sister    Diabetes Maternal Uncle    Colon cancer Paternal Aunt    Diabetes Paternal Aunt    Stomach cancer Neg Hx    Rectal cancer Neg Hx    Esophageal cancer Neg Hx    Colon polyps Neg Hx     Social History:  reports that she has never smoked. She has never used smokeless tobacco. She reports that she does not currently use alcohol. She reports that she does not use drugs.  Review of Systems:  Hypertension:   Treatment includes valsartan, taking combination with amlodipine from PCP  BP Readings from Last 3 Encounters:  12/10/21 (!) 158/95  12/10/21 (!) 161/98  11/19/21 (!) 159/82    Lipids: Managed by PCP with 10 mg Zocor    Lab Results  Component Value Date   CHOL 153 01/01/2021   CHOL 150 01/21/2020   Lab Results  Component Value Date   HDL 42 01/01/2021   HDL 36.50 (L) 01/21/2020   Lab Results  Component Value Date   LDLCALC 89 01/01/2021   LDLCALC 83 01/21/2020   Lab Results  Component Value Date   TRIG 108 01/01/2021   TRIG 154.0 (H) 01/21/2020   Lab Results   Component Value Date   CHOLHDL 3.6 01/01/2021   CHOLHDL 4 01/21/2020   No results found for: "LDLDIRECT"   She has been treated for HYPOTHYROIDISM since about 2020  Her prescriptions have been done by her oncologist and is taking 250 micrograms levothyroxine now Does not complain of feeling tired  She  does take Tums for calcium but not in the morning with her levothyroxine    Lab Results  Component Value Date   TSH 0.926 11/19/2021   TSH 11.899 (H) 09/15/2021   TSH 16.524 (H) 08/27/2021   Lab Results  Component Value Date   CALCIUM 8.4 (L) 12/10/2021   VITAMIN D deficiency:: She is getting vitamin D 2, 50,000 units twice per week Usually takes this with breakfast and most of the time may have some meat to eat at breakfast time  Her levels are tending to be consistently low     Examination:   There were no vitals taken for this visit.  There is no height or weight on file to calculate BMI.    ASSESSMENT/ PLAN:    Diabetes type 2:   Current treatment: None  A1c is normal at 5.8 with fairly normal blood sugars at home and labs also She is doing very well with diet and exercise regimen and maintaining her weight loss To continue treatment with lifestyle changes only and no medications required Continue metformin if blood sugars start going up especially fasting  Vitamin D deficiency: Levels have been low despite taking high-dose vitamin D2 and may benefit from trying D3 Also not clear if she may be having a vitamin D binding protein deficiency given her ethnic origin  No recurrence of hypercalcemia  Hypertension: To follow-up with PCP  Hypothyroidism: Currently being managed by oncologist  Follow-up as needed  There are no Patient Instructions on file for this visit.   Elayne Snare 12/27/2021, 8:36 PM

## 2021-12-28 ENCOUNTER — Encounter: Payer: Self-pay | Admitting: Endocrinology

## 2021-12-28 ENCOUNTER — Ambulatory Visit (INDEPENDENT_AMBULATORY_CARE_PROVIDER_SITE_OTHER): Payer: Medicare Other | Admitting: Endocrinology

## 2021-12-28 VITALS — BP 138/82 | HR 77 | Ht 66.0 in | Wt 148.4 lb

## 2021-12-28 DIAGNOSIS — E119 Type 2 diabetes mellitus without complications: Secondary | ICD-10-CM

## 2021-12-28 NOTE — Patient Instructions (Addendum)
Check blood sugars on waking up 2  days a week  Also check blood sugars about 2 hours after meals and do this after different meals by rotation  Recommended blood sugar levels on waking up are 70--100 and about 2 hours after meal is 120-150  Walk daily

## 2021-12-30 ENCOUNTER — Other Ambulatory Visit: Payer: Self-pay

## 2021-12-31 ENCOUNTER — Inpatient Hospital Stay: Payer: Medicare Other | Attending: Gynecology

## 2021-12-31 ENCOUNTER — Encounter: Payer: Self-pay | Admitting: Hematology and Oncology

## 2021-12-31 ENCOUNTER — Other Ambulatory Visit: Payer: Self-pay

## 2021-12-31 ENCOUNTER — Inpatient Hospital Stay: Payer: Medicare Other

## 2021-12-31 ENCOUNTER — Inpatient Hospital Stay (HOSPITAL_BASED_OUTPATIENT_CLINIC_OR_DEPARTMENT_OTHER): Payer: Medicare Other | Admitting: Hematology and Oncology

## 2021-12-31 VITALS — BP 171/82 | HR 79 | Temp 97.7°F | Resp 18 | Ht 66.0 in | Wt 147.4 lb

## 2021-12-31 VITALS — BP 139/94 | HR 73 | Resp 18

## 2021-12-31 DIAGNOSIS — C541 Malignant neoplasm of endometrium: Secondary | ICD-10-CM

## 2021-12-31 DIAGNOSIS — N183 Chronic kidney disease, stage 3 unspecified: Secondary | ICD-10-CM

## 2021-12-31 DIAGNOSIS — Z7189 Other specified counseling: Secondary | ICD-10-CM

## 2021-12-31 DIAGNOSIS — C773 Secondary and unspecified malignant neoplasm of axilla and upper limb lymph nodes: Secondary | ICD-10-CM

## 2021-12-31 DIAGNOSIS — Z79899 Other long term (current) drug therapy: Secondary | ICD-10-CM | POA: Insufficient documentation

## 2021-12-31 DIAGNOSIS — I129 Hypertensive chronic kidney disease with stage 1 through stage 4 chronic kidney disease, or unspecified chronic kidney disease: Secondary | ICD-10-CM | POA: Diagnosis not present

## 2021-12-31 DIAGNOSIS — I10 Essential (primary) hypertension: Secondary | ICD-10-CM | POA: Diagnosis not present

## 2021-12-31 DIAGNOSIS — Z5112 Encounter for antineoplastic immunotherapy: Secondary | ICD-10-CM | POA: Insufficient documentation

## 2021-12-31 LAB — CMP (CANCER CENTER ONLY)
ALT: 17 U/L (ref 0–44)
AST: 18 U/L (ref 15–41)
Albumin: 3.3 g/dL — ABNORMAL LOW (ref 3.5–5.0)
Alkaline Phosphatase: 56 U/L (ref 38–126)
Anion gap: 4 — ABNORMAL LOW (ref 5–15)
BUN: 25 mg/dL — ABNORMAL HIGH (ref 8–23)
CO2: 21 mmol/L — ABNORMAL LOW (ref 22–32)
Calcium: 8.3 mg/dL — ABNORMAL LOW (ref 8.9–10.3)
Chloride: 119 mmol/L — ABNORMAL HIGH (ref 98–111)
Creatinine: 1.14 mg/dL — ABNORMAL HIGH (ref 0.44–1.00)
GFR, Estimated: 51 mL/min — ABNORMAL LOW (ref >60)
Glucose, Bld: 99 mg/dL (ref 70–99)
Potassium: 3.1 mmol/L — ABNORMAL LOW (ref 3.5–5.1)
Sodium: 144 mmol/L (ref 135–145)
Total Bilirubin: 0.5 mg/dL (ref 0.3–1.2)
Total Protein: 5.8 g/dL — ABNORMAL LOW (ref 6.5–8.1)

## 2021-12-31 LAB — CBC WITH DIFFERENTIAL (CANCER CENTER ONLY)
Abs Immature Granulocytes: 0.01 10*3/uL (ref 0.00–0.07)
Basophils Absolute: 0 10*3/uL (ref 0.0–0.1)
Basophils Relative: 0 %
Eosinophils Absolute: 0 10*3/uL (ref 0.0–0.5)
Eosinophils Relative: 1 %
HCT: 40.6 % (ref 36.0–46.0)
Hemoglobin: 13.3 g/dL (ref 12.0–15.0)
Immature Granulocytes: 0 %
Lymphocytes Relative: 32 %
Lymphs Abs: 1.3 10*3/uL (ref 0.7–4.0)
MCH: 29.3 pg (ref 26.0–34.0)
MCHC: 32.8 g/dL (ref 30.0–36.0)
MCV: 89.4 fL (ref 80.0–100.0)
Monocytes Absolute: 0.3 10*3/uL (ref 0.1–1.0)
Monocytes Relative: 7 %
Neutro Abs: 2.4 10*3/uL (ref 1.7–7.7)
Neutrophils Relative %: 60 %
Platelet Count: 174 10*3/uL (ref 150–400)
RBC: 4.54 MIL/uL (ref 3.87–5.11)
RDW: 14.5 % (ref 11.5–15.5)
WBC Count: 4 10*3/uL (ref 4.0–10.5)
nRBC: 0 % (ref 0.0–0.2)

## 2021-12-31 LAB — TSH: TSH: 8.707 u[IU]/mL — ABNORMAL HIGH (ref 0.350–4.500)

## 2021-12-31 MED ORDER — SODIUM CHLORIDE 0.9 % IV SOLN
200.0000 mg | Freq: Once | INTRAVENOUS | Status: AC
  Administered 2021-12-31: 200 mg via INTRAVENOUS
  Filled 2021-12-31: qty 200

## 2021-12-31 MED ORDER — HEPARIN SOD (PORK) LOCK FLUSH 100 UNIT/ML IV SOLN
500.0000 [IU] | Freq: Once | INTRAVENOUS | Status: AC | PRN
  Administered 2021-12-31: 500 [IU]

## 2021-12-31 MED ORDER — SODIUM CHLORIDE 0.9% FLUSH
10.0000 mL | INTRAVENOUS | Status: DC | PRN
  Administered 2021-12-31: 10 mL

## 2021-12-31 MED ORDER — SODIUM CHLORIDE 0.9% FLUSH
10.0000 mL | Freq: Once | INTRAVENOUS | Status: AC
  Administered 2021-12-31: 10 mL

## 2021-12-31 MED ORDER — SODIUM CHLORIDE 0.9 % IV SOLN: Freq: Once | INTRAVENOUS | Status: AC

## 2021-12-31 NOTE — Assessment & Plan Note (Signed)
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

## 2021-12-31 NOTE — Assessment & Plan Note (Signed)
Recent renal function fluctuates up and down We discussed the importance of aggressive risk factor modification and to drink plenty of oral fluids

## 2021-12-31 NOTE — Progress Notes (Signed)
[place] OFFICE PROGRESS NOTE  Patient Care Team: Jonathon Jordan, MD as PCP - General (Family Medicine)  ASSESSMENT & PLAN:  Endometrial cancer [employer]) Her last few multiple imaging studies showed persistent lymphadenopathy in the right axilla, stable Overall, while she does not have complete response, combination of pembrolizumab and lenvatinib is controlling her disease She will continue this treatment indefinitely I plan to repeat imaging study again in December  Chronic kidney disease (CKD), stage III (moderate) (HCC) Recent renal function fluctuates up and down We discussed the importance of aggressive risk factor modification and to drink plenty of oral fluids  Essential hypertension Her documented blood pressure here is high even though according to the patient, at home, her blood pressure is normal in the 120s She has history of mild proteinuria She will continue current blood pressure management  No orders of the defined types were placed in this encounter.   All questions were answered. The patient knows to call the clinic with any problems, questions or concerns. The total time spent in the appointment was 20 minutes encounter with patients including review of chart and various tests results, discussions about plan of care and coordination of care plan   [name], MD 12/31/2021 12:46 PM  INTERVAL HISTORY: Please see below for problem oriented charting. she returns for treatment follow-up on pembrolizumab and Lenvima maintenance treatment According to the patient, her blood pressure at home is normal She was told by her primary care doctor her hemoglobin A1c is good recently She denies side effects from treatment  REVIEW OF SYSTEMS:   Constitutional: Denies fevers, chills or abnormal weight loss Eyes: Denies blurriness of vision Ears, nose, mouth, throat, and face: Denies mucositis or sore throat Respiratory: Denies cough, dyspnea or  wheezes Cardiovascular: Denies palpitation, chest discomfort or lower extremity swelling Gastrointestinal:  Denies nausea, heartburn or change in bowel habits Skin: Denies abnormal skin rashes Lymphatics: Denies new lymphadenopathy or easy bruising Neurological:Denies numbness, tingling or new weaknesses Behavioral/Psych: Mood is stable, no new changes  All other systems were reviewed with the patient and are negative.  I have reviewed the past medical history, past surgical history, social history and family history with the patient and they are unchanged from previous note.  ALLERGIES:  is allergic to sulfa antibiotics and sulfamethoxazole.  MEDICATIONS:  Current Outpatient Medications  Medication Sig Dispense Refill   amLODipine-valsartan (EXFORGE) 5-160 MG tablet Take 1 tablet by mouth daily.     atenolol (TENORMIN) 25 MG tablet Take 1 tablet (25 mg total) by mouth daily. 90 tablet 1   Azelastine HCl 137 MCG/SPRAY SOLN Place into both nostrils.     Calcium Carb-Cholecalciferol 600-800 MG-UNIT TABS Take 1 tablet by mouth daily. (Patient not taking: Reported on 12/09/2021)     calcium carbonate (TUMS - DOSED IN MG ELEMENTAL CALCIUM) 500 MG chewable tablet Chew 2 tablets by mouth 2 (two) times daily.     cholecalciferol (VITAMIN D3) 25 MCG (1000 UNIT) tablet Take 5,000 Units by mouth daily.     ergocalciferol (VITAMIN D2) 1.25 MG (50000 UT) capsule Take 1 capsule (50,000 Units total) by mouth once a week. 25 capsule 1   glucose blood (ONETOUCH VERIO) test strip Use as instructed to check blood sugars 3 times a day. 100 each 3   lenvatinib 10 mg daily dose (LENVIMA, 10 MG DAILY DOSE,) capsule Take 1 capsule (10 mg total) by mouth daily. 30 capsule 11   levothyroxine (EUTHYROX) 200 MCG tablet Take 1 tablet (  200 mcg total) by mouth daily before breakfast. 90 tablet 3   levothyroxine (EUTHYROX) 50 MCG tablet Take 1 tablet (50 mcg total) by mouth daily before breakfast. 90 tablet 3    lidocaine-prilocaine (EMLA) cream Apply topically daily as needed. 30 g 3   OneTouch Delica Lancets 60V MISC 1 each by Other route 2 (two) times daily. Use to monitor glucose levels BID; E11.65 100 each 2   RELION PEN NEEDLES 32G X 4 MM MISC USE 1 PEN PER DAY 50 each 5   simvastatin (ZOCOR) 10 MG tablet Take 10 mg by mouth at bedtime.      No current facility-administered medications for this visit.   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 Leo Grosser who obtained an endometrial biopsy showing poorly differentiated endometrial carcinoma and negative endocervical curettage   12/12/2017 Imaging   MAMMOGRAM FINDINGS: In the right axilla, a possible mass warrants further evaluation. In the left breast, no findings suspicious for malignancy.   Images were processed with CAD.   IMPRESSION: Further evaluation is suggested for possible mass in the right axilla.     12/24/2017 Imaging   Ct scan chest, abdomen and pelvis 1. Marked thickening of the endometrium (42 mm) compatible with known primary endometrial malignancy. No evidence of extrauterine invasion. 2. No pelvic or retroperitoneal adenopathy. 3. Right middle lobe 4 mm solid pulmonary nodule, for which follow-up chest CT is advised in 3-6 months. 4. Several findings that are equivocal for distant metastatic disease. Vaguely nodular heterogeneous hyperenhancement in the peripheral right liver lobe, which could represent  benign transient perfusional phenomena, with underlying liver lesions not entirely excluded. Mildly sclerotic T12 vertebral lesion. Mildly enlarged right axillary lymph node. The best single test to further evaluate these findings would be a PET-CT. Alternative tests include bone scan or thoracic MRI without and with IV contrast for the T12 osseous lesion, MRI abdomen without and with IV contrast for the liver findings, and diagnostic mammographic evaluation for the right axillary node. 5.  Aortic Atherosclerosis (ICD10-I70.0).     01/09/2018 PET scan   1. Moderate hypermetabolism corresponding to enlarging axillary node since 12/22/2017. Highly suspicious for an atypical distribution of metastatic disease. 2. No hypermetabolism to suggest hepatic or T12 osseous metastasis. 3. Hypermetabolic endometrial primary.   01/23/2018 Initial Diagnosis   Endometrial cancer ([place])   01/23/2018 Pathology Results   1. Lymph node, sentinel, biopsy, left external iliac - NO CARCINOMA IDENTIFIED IN ONE LYMPH NODE (0/1) - SEE COMMENT 2. Lymph nodes, regional resection, right para aortic - NO CARCINOMA IDENTIFIED IN FOUR LYMPH NODES (0/4) - SEE COMMENT 3. Lymph nodes, regional resection, right pelvic - NO CARCINOMA IDENTIFIED IN EIGHT LYMPH NODES (0/8) - SEE COMMENT 4. Cul-de-sac biopsy - METASTATIC CARCINOMA 5. Uterus +/- tubes/ovaries, neoplastic, cervix, bilateral fallopian tubes and ovaries UTERUS: - MIXED SEROUS AND ENDOMETRIOID CARCINOMA - SEROSAL IMPLANTS PRESENT - LYMPHOVASCULAR SPACE INVASION PRESENT - LEIOMYOMATA (1.5 CM; LARGEST) - SEE ONCOLOGY TABLE AND COMMENT  BELOW CERVIX: - BENIGN NABOTHIAN CYSTS - NO CARCINOMA IDENTIFIED BILATERAL OVARIES: - METASTATIC CARCINOMA PRESENT ON OVARIAN SURFACE BILATERAL FALLOPIAN TUBES: - INTRALUMINAL CARCINOMA Microscopic Comment 1. -3. Cytokeratin AE1/3 was performed on the sentinel lymph nodes to exclude micrometastasis. There is no evidence of  metastatic carcinoma by immunohistochemistry. 5. 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%) Histologic Grade: N/A Myometrial invasion: Estimated less than 50% myometrial invasion (0.3 cm of myometrium involved; 1.4 cm measured thickness) Uterine Serosa Involvement: Present Cervical stromal involvement: Not identified Extent of involvement of other organs: - Fallopian tube (left within the lumen) - Ovary, left (surface involvement) - Cul-de-sac Lymphovascular invasion: Present Regional Lymph Nodes: Examined: 1 Sentinel 12 Non-sentinel 13 Total Lymph nodes with metastasis: 0 Isolated tumor cells (< 0.2 mm): 0 Micrometastasis: (> 0.2 mm and < 2.0 mm): 0 Macrometastasis: (> 2.0 mm): 0 Extracapsular extension: N/A Tumor block for ancillary studies: 5E, 5B MMR / MSI testing: Pending will be reported separately Pathologic Stage Classification (pTNM, AJCC 8th edition): pT3a, pN0 FIGO Stage: IIIA COMMENT: There is tumor present on the surface of the left ovary and within the lumen of the left fallopian tube. The carcinoma appears to be mixed with the largest component being high grade serous carcinoma. Dr. Lyndon Code reviewed the case and agrees with the above diagnosis.   01/23/2018 Surgery   Surgeon: Donaciano Eva     Operation: Robotic-assisted laparoscopic total hysterectomy with bilateral salpingoophorectomy, SLN injection, mapping and biopsy, right pelvic and para-aortic lymphadenectomy   Operative Findings:  : 10-12cm bulky uterus with frank serosal involvement on posterior cul de sac peritoneum and anterior peritoneum which adhesed the bladder to the anterior uterus. Unilateral mapping on left pelvis. No grossly suspicious nodes. Normal omentum and  diaphragms.    02/01/2018 Pathology Results   Lymph node, needle/core biopsy, right axilla - METASTATIC CARCINOMA - SEE COMMENT Microscopic Comment The neoplastic cells are positive for cytokeratin 7 and Pax-8 but negative for cytokeratin 5/6, cytokeratin 20, Gata-3, p63, p53 and GCDFP. Overall, the immunoprofile is consistent with metastasis from the patient's known gynecologic carcinoma.   02/01/2018 Procedure   Ultrasound-guided core biopsies of a suspicious right axillary lymph node.   02/21/2018 - 06/13/2018 Chemotherapy   The patient had carboplatin & Taxol x 6   03/26/2018 - 04/30/2018 Radiation Therapy   Radiation treatment dates:   03/26/18, 04/02/18, 04/19/18, 04/23/18 04/30/18   Site/dose: proximal Vagina, 6 Gy in 5 fractions for a total dose of 30 Gy     04/26/2018 PET scan   1. Interval resection of the hypermetabolic endometrial primary. No discernible hypermetabolic pelvic sidewall or peritoneal metastases. 2. Stable hypermetabolic bilateral axillary lymphadenopathy. The degree of hypermetabolism in these lymph nodes remains highly suspicious for neoplasm. 3. Interval development of low level FDG uptake in 2 stable, small lymph nodes in the left groin region. This may be reactive, but close attention recommended to exclude metastatic involvement. 4. Stable non hypermetabolic low-density liver lesions and the mixed lucent and sclerotic T12 lesion is stable without hypermetabolism today.   07/09/2018 Imaging   Status post hysterectomy.   Mild axillary lymphadenopathy, improved. Nodal metastases not excluded.   Irregular wall thickening involving the anterior bladder, possibly reflecting radiation cystitis versus tumor.   Additional stable findings as above, including a 5 mm right middle lobe nodule and stable sclerosis involving  the T12 vertebral body.   10/10/2018 Imaging   1. Stable exam. No findings within the abdomen or pelvis to suggest metastatic disease. 2. Unchanged  appearance of sclerosis involving the T12 vertebra. 3.  Aortic Atherosclerosis (ICD10-I70.0).   10/10/2018 Imaging   Ct abdomen and pelvis 1. Stable exam. No findings within the abdomen or pelvis to suggest metastatic disease. 2. Unchanged appearance of sclerosis involving the T12 vertebra. 3.  Aortic Atherosclerosis (ICD10-I70.0).   01/16/2019 PET scan   1. Enlargement and increased activity in the left axillary, right axillary, and left inguinal adenopathy compatible with progressive malignancy. 2. Stable 4 mm right middle lobe subpleural nodule, not appreciably hypermetabolic. 3. Other imaging findings of potential clinical significance: Right kidney lower pole cyst. Aortic Atherosclerosis (ICD10-I70.0). Prominent stool throughout the colon favors constipation. Mild chronic left maxillary sinusitis.     02/01/2019 -  Chemotherapy   The patient had pembrolizumab and Lenvima for chemotherapy treatment.     04/25/2019 Imaging   Ct imaging 1. Interval response to therapy. Decrease in size of bilateral axillary and left inguinal lymph nodes. No new or progressive findings identified. 2. Stable sclerotic appearance of the T12 vertebra. 3. Aortic atherosclerosis. Lad coronary artery calcification noted.   08/30/2019 Imaging   1. Bulky RIGHT hilar lymph node, slightly increased in size from previous imaging. 2. Multiple foci of hyperenhancement in the liver. 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 Consider MRI liver on follow-up to establish a baseline for number of lesions as these will appear variable on CT follow-up based on phase of contrast acquisition in the future. 3. Stable 5 mm nodule adjacent to the fissure, minor fissure in the RIGHT middle lobe.     11/28/2019 Imaging   1. Stable  exam. No new or progressive findings. 2. Stable enlarged right axillary lymph node. 3. Stable tiny bilateral pulmonary nodules. Continued attention on follow-up recommended. 4. Scattered hypodensities in the liver parenchyma correspond to the hypervascular lesions seen on the previous study performed with intravenous contrast material. 5. Right renal cyst. 6. Aortic Atherosclerosis (ICD10-I70.0).     03/16/2020 Imaging   Increased echogenicity of renal parenchyma is noted bilaterally suggesting medical renal disease. No hydronephrosis or renal obstruction is noted   04/02/2020 Imaging   1. Right axillary lymphadenopathy is stable. Tiny bilateral pulmonary nodules are stable. Subcentimeter low-attenuation liver lesions are stable. 2. No new or progressive metastatic disease in the chest, abdomen or pelvis. 3. Aortic Atherosclerosis (ICD10-I70.0).   09/15/2020 Imaging   1. Stable RIGHT axillary lymph node with enlargement measuring 2 cm short axis. 2. Stable small nodule along the minor fissure in the RIGHT chest. 3. No new or progressive findings. 4. Aortic atherosclerosis.   03/24/2021 Imaging   1. Right axillary lymphadenopathy is stable compared to prior studies. 2. No other definite signs of metastatic disease elsewhere in the thorax. 3. Hepatic steatosis. Numerous hypervascular areas noted in the liver, incompletely characterized on today's arterial phase examination. These are similar to remote prior examination from 08/30/2019, likely to represent small benign lesions such as flash fill cavernous hemangiomas. These could be definitively evaluated with nonemergent abdominal MRI with and without IV gadolinium if of clinical concern. 4. Aortic atherosclerosis, in addition to left main and left anterior descending coronary artery disease. Assessment for  potential risk factor modification, dietary therapy or pharmacologic therapy may be warranted, if clinically indicated.   Aortic  Atherosclerosis (ICD10-I70.0).   09/16/2021 Imaging   1. Unchanged enlarged right axillary lymph node. No evidence of new lymphadenopathy or metastatic disease in the chest, abdomen, or pelvis. 2. Subcentimeter hyperenhancing lesions of the right lobe of the liver, hepatic segments VII and VIII are unchanged, measuring up to 0.7 cm. These are likely small benign incidental flash filling hemangiomata although incompletely characterized on this examination. Attention on follow-up. 3. Status post hysterectomy. 4. Cholelithiasis. 5. Coronary artery disease.   Aortic Atherosclerosis (ICD10-I70.0).     12/31/2021 -  Chemotherapy   Patient is on Treatment Plan : UTERINE Lenvatinib (20) D1-21 + Pembrolizumab (200) D1 q21d       PHYSICAL EXAMINATION: ECOG PERFORMANCE STATUS: 0 - Asymptomatic  Vitals:   12/31/21 0848  BP: (!) 171/82  Pulse: 79  Resp: 18  Temp: 97.7 F (36.5 C)  SpO2: 100%   Filed Weights   12/31/21 0848  Weight: 147 lb 6.4 oz (66.9 kg)    GENERAL:alert, no distress and comfortable NEURO: alert & oriented x 3 with fluent speech, no focal motor/sensory deficits  LABORATORY DATA:  I have reviewed the data as listed    Component Value Date/Time   NA 144 12/31/2021 0822   K 3.1 (L) 12/31/2021 0822   CL 119 (H) 12/31/2021 0822   CO2 21 (L) 12/31/2021 0822   GLUCOSE 99 12/31/2021 0822   BUN 25 (H) 12/31/2021 0822   CREATININE 1.14 (H) 12/31/2021 0822   CALCIUM 8.3 (L) 12/31/2021 0822   PROT 5.8 (L) 12/31/2021 0822   ALBUMIN 3.3 (L) 12/31/2021 0822   AST 18 12/31/2021 0822   ALT 17 12/31/2021 0822   ALKPHOS 56 12/31/2021 0822   BILITOT 0.5 12/31/2021 0822   GFRNONAA 51 (L) 12/31/2021 0822   GFRAA 34 (L) 01/10/2020 1200    No results found for: "SPEP", "UPEP"  Lab Results  Component Value Date   WBC 4.0 12/31/2021   NEUTROABS 2.4 12/31/2021   HGB 13.3 12/31/2021   HCT 40.6 12/31/2021   MCV 89.4 12/31/2021   PLT 174 12/31/2021      Chemistry       Component Value Date/Time   NA 144 12/31/2021 0822   K 3.1 (L) 12/31/2021 0822   CL 119 (H) 12/31/2021 0822   CO2 21 (L) 12/31/2021 0822   BUN 25 (H) 12/31/2021 0822   CREATININE 1.14 (H) 12/31/2021 0822      Component Value Date/Time   CALCIUM 8.3 (L) 12/31/2021 0822   ALKPHOS 56 12/31/2021 0822   AST 18 12/31/2021 0822   ALT 17 12/31/2021 0822   BILITOT 0.5 12/31/2021 0383

## 2021-12-31 NOTE — Patient Instructions (Signed)
[facility] MEDICAL ONCOLOGY  Discharge Instructions: Thank you for choosing [facility] to provide your oncology and hematology care.   If you have a lab appointment with the Cancer Center, please go directly to the Cancer Center and check in at the registration area.   Wear comfortable clothing and clothing appropriate for easy access to any Portacath or PICC line.   We strive to give you quality time with your provider. You may need to reschedule your appointment if you arrive late (15 or more minutes).  Arriving late affects you and other patients whose appointments are after yours.  Also, if you miss three or more appointments without notifying the office, you may be dismissed from the clinic at the provider's discretion.      For prescription refill requests, have your pharmacy contact our office and allow 72 hours for refills to be completed.    Today you received the following chemotherapy and/or immunotherapy agents: Keytruda.       To help prevent nausea and vomiting after your treatment, we encourage you to take your nausea medication as directed.  BELOW ARE SYMPTOMS THAT SHOULD BE REPORTED IMMEDIATELY: *FEVER GREATER THAN 100.4 F (38 C) OR HIGHER *CHILLS OR SWEATING *NAUSEA AND VOMITING THAT IS NOT CONTROLLED WITH YOUR NAUSEA MEDICATION *UNUSUAL SHORTNESS OF BREATH *UNUSUAL BRUISING OR BLEEDING *URINARY PROBLEMS (pain or burning when urinating, or frequent urination) *BOWEL PROBLEMS (unusual diarrhea, constipation, pain near the anus) TENDERNESS IN MOUTH AND THROAT WITH OR WITHOUT PRESENCE OF ULCERS (sore throat, sores in mouth, or a toothache) UNUSUAL RASH, SWELLING OR PAIN  UNUSUAL VAGINAL DISCHARGE OR ITCHING   Items with * indicate a potential emergency and should be followed up as soon as possible or go to the Emergency Department if any problems should occur.  Please show the CHEMOTHERAPY ALERT CARD or IMMUNOTHERAPY ALERT CARD at check-in to  the Emergency Department and triage nurse.  Should you have questions after your visit or need to cancel or reschedule your appointment, please contact [facility] MEDICAL ONCOLOG[facility]  Dept: [phone]  and follow the prompts.  Office hours are 8:00 a.m. to 4:30 p.m. Monday - Friday. Please note that voicemails left after 4:00 p.m. may not be returned until the following business day.  We are closed weekends and major holidays. You have access to a nurse at all times for urgent questions. Please call the main number to the clinic Dept: [phone] and follow the prompts.   For any non-urgent questions, you may also contact your provider using MyChart. We now offer e-Visits for anyone 18 and older to request care online for non-urgent symptoms. For details visit [email or web address].   Also download the MyChart app! Go to the app store, search "MyChart", open the app, select [facility], and log in with your MyChart username and password.  Masks are optional in the cancer centers. If you would like for your care team to wear a mask while they are taking care of you, please let them know. You may have one support person who is at least 71 years old accompany you for your appointments. 

## 2021-12-31 NOTE — Assessment & Plan Note (Signed)
Her documented blood pressure here is high even though according to the patient, at home, her blood pressure is normal in the 120s She has history of mild proteinuria She will continue current blood pressure management 

## 2022-01-02 LAB — T4: T4, Total: 6.8 ug/dL (ref 4.5–12.0)

## 2022-01-03 ENCOUNTER — Ambulatory Visit: Payer: Medicare Other | Admitting: Dietician

## 2022-01-04 ENCOUNTER — Other Ambulatory Visit: Payer: Self-pay

## 2022-01-04 DIAGNOSIS — E559 Vitamin D deficiency, unspecified: Secondary | ICD-10-CM

## 2022-01-04 MED ORDER — ERGOCALCIFEROL 1.25 MG (50000 UT) PO CAPS: 50000.0000 [IU] | ORAL_CAPSULE | ORAL | 1 refills | Status: DC

## 2022-01-05 DIAGNOSIS — Z6832 Body mass index (BMI) 32.0-32.9, adult: Secondary | ICD-10-CM | POA: Diagnosis not present

## 2022-01-05 DIAGNOSIS — D61818 Other pancytopenia: Secondary | ICD-10-CM | POA: Diagnosis not present

## 2022-01-05 DIAGNOSIS — Z8542 Personal history of malignant neoplasm of other parts of uterus: Secondary | ICD-10-CM | POA: Diagnosis not present

## 2022-01-05 DIAGNOSIS — E785 Hyperlipidemia, unspecified: Secondary | ICD-10-CM | POA: Diagnosis not present

## 2022-01-05 DIAGNOSIS — C801 Malignant (primary) neoplasm, unspecified: Secondary | ICD-10-CM | POA: Diagnosis not present

## 2022-01-05 DIAGNOSIS — E559 Vitamin D deficiency, unspecified: Secondary | ICD-10-CM | POA: Diagnosis not present

## 2022-01-05 DIAGNOSIS — E210 Primary hyperparathyroidism: Secondary | ICD-10-CM | POA: Diagnosis not present

## 2022-01-05 DIAGNOSIS — I700 Atherosclerosis of aorta: Secondary | ICD-10-CM | POA: Diagnosis not present

## 2022-01-05 DIAGNOSIS — I2510 Atherosclerotic heart disease of native coronary artery without angina pectoris: Secondary | ICD-10-CM | POA: Diagnosis not present

## 2022-01-05 DIAGNOSIS — E1122 Type 2 diabetes mellitus with diabetic chronic kidney disease: Secondary | ICD-10-CM | POA: Diagnosis not present

## 2022-01-05 DIAGNOSIS — Z0000 Encounter for general adult medical examination without abnormal findings: Secondary | ICD-10-CM | POA: Diagnosis not present

## 2022-01-05 DIAGNOSIS — I10 Essential (primary) hypertension: Secondary | ICD-10-CM | POA: Diagnosis not present

## 2022-01-07 ENCOUNTER — Other Ambulatory Visit: Payer: Self-pay

## 2022-01-07 DIAGNOSIS — E559 Vitamin D deficiency, unspecified: Secondary | ICD-10-CM

## 2022-01-07 MED ORDER — VITAMIN D 25 MCG (1000 UNIT) PO TABS: 5000.0000 [IU] | ORAL_TABLET | Freq: Every day | ORAL | 2 refills | Status: DC

## 2022-01-11 ENCOUNTER — Telehealth: Payer: Self-pay

## 2022-01-11 NOTE — Telephone Encounter (Signed)
Patient called in about her Vit D deficiency. States she received a prescription for vit D from office but is already taken a OTC Vit D and doesn't need Rx. States that vit D defiency was not discussed at last appt Informed patient that per your office note she is being followed by her PCP for this. States that wasn't communicated to her but will reach out to PCP.

## 2022-01-17 NOTE — Telephone Encounter (Signed)
Noted  

## 2022-01-21 ENCOUNTER — Encounter: Payer: Self-pay | Admitting: Hematology and Oncology

## 2022-01-21 ENCOUNTER — Inpatient Hospital Stay (HOSPITAL_BASED_OUTPATIENT_CLINIC_OR_DEPARTMENT_OTHER): Payer: Medicare Other | Admitting: Hematology and Oncology

## 2022-01-21 ENCOUNTER — Inpatient Hospital Stay: Payer: Medicare Other

## 2022-01-21 ENCOUNTER — Inpatient Hospital Stay: Payer: Medicare Other | Attending: Gynecology

## 2022-01-21 ENCOUNTER — Other Ambulatory Visit: Payer: Self-pay

## 2022-01-21 VITALS — BP 135/85 | HR 70 | Temp 97.7°F | Resp 18

## 2022-01-21 VITALS — BP 149/86 | HR 76 | Temp 97.4°F | Resp 18 | Ht 66.0 in | Wt 146.4 lb

## 2022-01-21 DIAGNOSIS — Z79899 Other long term (current) drug therapy: Secondary | ICD-10-CM | POA: Insufficient documentation

## 2022-01-21 DIAGNOSIS — I10 Essential (primary) hypertension: Secondary | ICD-10-CM

## 2022-01-21 DIAGNOSIS — C541 Malignant neoplasm of endometrium: Secondary | ICD-10-CM

## 2022-01-21 DIAGNOSIS — Z7189 Other specified counseling: Secondary | ICD-10-CM

## 2022-01-21 DIAGNOSIS — I129 Hypertensive chronic kidney disease with stage 1 through stage 4 chronic kidney disease, or unspecified chronic kidney disease: Secondary | ICD-10-CM | POA: Diagnosis not present

## 2022-01-21 DIAGNOSIS — Z5112 Encounter for antineoplastic immunotherapy: Secondary | ICD-10-CM | POA: Diagnosis not present

## 2022-01-21 DIAGNOSIS — C773 Secondary and unspecified malignant neoplasm of axilla and upper limb lymph nodes: Secondary | ICD-10-CM | POA: Diagnosis not present

## 2022-01-21 DIAGNOSIS — N183 Chronic kidney disease, stage 3 unspecified: Secondary | ICD-10-CM | POA: Insufficient documentation

## 2022-01-21 LAB — CMP (CANCER CENTER ONLY)
ALT: 13 U/L (ref 0–44)
AST: 12 U/L — ABNORMAL LOW (ref 15–41)
Albumin: 3.5 g/dL (ref 3.5–5.0)
Alkaline Phosphatase: 59 U/L (ref 38–126)
Anion gap: 6 (ref 5–15)
BUN: 22 mg/dL (ref 8–23)
CO2: 22 mmol/L (ref 22–32)
Calcium: 8.6 mg/dL — ABNORMAL LOW (ref 8.9–10.3)
Chloride: 115 mmol/L — ABNORMAL HIGH (ref 98–111)
Creatinine: 1.31 mg/dL — ABNORMAL HIGH (ref 0.44–1.00)
GFR, Estimated: 44 mL/min — ABNORMAL LOW (ref >60)
Glucose, Bld: 100 mg/dL — ABNORMAL HIGH (ref 70–99)
Potassium: 3.5 mmol/L (ref 3.5–5.1)
Sodium: 143 mmol/L (ref 135–145)
Total Bilirubin: 0.5 mg/dL (ref 0.3–1.2)
Total Protein: 5.9 g/dL — ABNORMAL LOW (ref 6.5–8.1)

## 2022-01-21 LAB — CBC WITH DIFFERENTIAL (CANCER CENTER ONLY)
Abs Immature Granulocytes: 0.01 10*3/uL (ref 0.00–0.07)
Basophils Absolute: 0 10*3/uL (ref 0.0–0.1)
Basophils Relative: 0 %
Eosinophils Absolute: 0 10*3/uL (ref 0.0–0.5)
Eosinophils Relative: 0 %
HCT: 41 % (ref 36.0–46.0)
Hemoglobin: 13.3 g/dL (ref 12.0–15.0)
Immature Granulocytes: 0 %
Lymphocytes Relative: 27 %
Lymphs Abs: 1.2 10*3/uL (ref 0.7–4.0)
MCH: 29.2 pg (ref 26.0–34.0)
MCHC: 32.4 g/dL (ref 30.0–36.0)
MCV: 90.1 fL (ref 80.0–100.0)
Monocytes Absolute: 0.3 10*3/uL (ref 0.1–1.0)
Monocytes Relative: 7 %
Neutro Abs: 2.9 10*3/uL (ref 1.7–7.7)
Neutrophils Relative %: 66 %
Platelet Count: 158 10*3/uL (ref 150–400)
RBC: 4.55 MIL/uL (ref 3.87–5.11)
RDW: 14.8 % (ref 11.5–15.5)
WBC Count: 4.5 10*3/uL (ref 4.0–10.5)
nRBC: 0 % (ref 0.0–0.2)

## 2022-01-21 LAB — TOTAL PROTEIN, URINE DIPSTICK: Protein, ur: 30 mg/dL — AB

## 2022-01-21 MED ORDER — SODIUM CHLORIDE 0.9 % IV SOLN
200.0000 mg | Freq: Once | INTRAVENOUS | Status: AC
  Administered 2022-01-21: 200 mg via INTRAVENOUS
  Filled 2022-01-21: qty 200

## 2022-01-21 MED ORDER — SODIUM CHLORIDE 0.9 % IV SOLN: Freq: Once | INTRAVENOUS | Status: AC

## 2022-01-21 MED ORDER — HEPARIN SOD (PORK) LOCK FLUSH 100 UNIT/ML IV SOLN
500.0000 [IU] | Freq: Once | INTRAVENOUS | Status: AC | PRN
  Administered 2022-01-21: 500 [IU]

## 2022-01-21 MED ORDER — SODIUM CHLORIDE 0.9% FLUSH
10.0000 mL | Freq: Once | INTRAVENOUS | Status: AC
  Administered 2022-01-21: 10 mL

## 2022-01-21 MED ORDER — SODIUM CHLORIDE 0.9% FLUSH
10.0000 mL | INTRAVENOUS | Status: DC | PRN
  Administered 2022-01-21: 10 mL

## 2022-01-21 NOTE — Progress Notes (Signed)
[place] OFFICE PROGRESS NOTE  Patient Care Team: Jonathon Jordan, MD as PCP - General (Family Medicine)  ASSESSMENT & PLAN:  Endometrial cancer [facility]) Her last few multiple imaging studies showed persistent lymphadenopathy in the right axilla, stable Overall, while she does not have complete response, combination of pembrolizumab and lenvatinib is controlling her disease She will continue this treatment indefinitely I plan to repeat imaging study again in December  Chronic kidney disease (CKD), stage III (moderate) (HCC) Recent renal function fluctuates up and down We discussed the importance of aggressive risk factor modification and to drink plenty of oral fluids  Essential hypertension Her documented blood pressure here is high even though according to the patient, at home, her blood pressure is normal in the 120s She has history of mild proteinuria She will continue current blood pressure management  No orders of the defined types were placed in this encounter.   All questions were answered. The patient knows to call the clinic with any problems, questions or concerns. The total time spent in the appointment was 20 minutes encounter with patients including review of chart and various tests results, discussions about plan of care and coordination of care plan   [name], MD 01/21/2022 9:16 AM  INTERVAL HISTORY: Please see below for problem oriented charting. she returns for treatment follow-up on combination treatment with pembrolizumab and Lenvima Her documented blood pressure from home were within normal range She have no new symptoms  REVIEW OF SYSTEMS:   Constitutional: Denies fevers, chills or abnormal weight loss Eyes: Denies blurriness of vision Ears, nose, mouth, throat, and face: Denies mucositis or sore throat Respiratory: Denies cough, dyspnea or wheezes Cardiovascular: Denies palpitation, chest discomfort or lower extremity  swelling Gastrointestinal:  Denies nausea, heartburn or change in bowel habits Skin: Denies abnormal skin rashes Lymphatics: Denies new lymphadenopathy or easy bruising Neurological:Denies numbness, tingling or new weaknesses Behavioral/Psych: Mood is stable, no new changes  All other systems were reviewed with the patient and are negative.  I have reviewed the past medical history, past surgical history, social history and family history with the patient and they are unchanged from previous note.  ALLERGIES:  is allergic to sulfa antibiotics and sulfamethoxazole.  MEDICATIONS:  Current Outpatient Medications  Medication Sig Dispense Refill   amLODipine-valsartan (EXFORGE) 5-160 MG tablet Take 1 tablet by mouth daily.     atenolol (TENORMIN) 25 MG tablet Take 1 tablet (25 mg total) by mouth daily. 90 tablet 1   Azelastine HCl 137 MCG/SPRAY SOLN Place into both nostrils.     Calcium Carb-Cholecalciferol 600-800 MG-UNIT TABS Take 1 tablet by mouth daily. (Patient not taking: Reported on 12/09/2021)     calcium carbonate (TUMS - DOSED IN MG ELEMENTAL CALCIUM) 500 MG chewable tablet Chew 2 tablets by mouth 2 (two) times daily.     cholecalciferol (VITAMIN D3) 25 MCG (1000 UNIT) tablet Take 5 tablets (5,000 Units total) by mouth daily. 30 tablet 2   ergocalciferol (VITAMIN D2) 1.25 MG (50000 UT) capsule Take 1 capsule (50,000 Units total) by mouth once a week. 25 capsule 1   glucose blood (ONETOUCH VERIO) test strip Use as instructed to check blood sugars 3 times a day. 100 each 3   lenvatinib 10 mg daily dose (LENVIMA, 10 MG DAILY DOSE,) capsule Take 1 capsule (10 mg total) by mouth daily. 30 capsule 11   levothyroxine (EUTHYROX) 200 MCG tablet Take 1 tablet (200 mcg total) by mouth daily before breakfast. 90 tablet 3  levothyroxine (EUTHYROX) 50 MCG tablet Take 1 tablet (50 mcg total) by mouth daily before breakfast. 90 tablet 3   lidocaine-prilocaine (EMLA) cream Apply topically daily as  needed. 30 g 3   OneTouch Delica Lancets 37C MISC 1 each by Other route 2 (two) times daily. Use to monitor glucose levels BID; E11.65 100 each 2   RELION PEN NEEDLES 32G X 4 MM MISC USE 1 PEN PER DAY 50 each 5   simvastatin (ZOCOR) 10 MG tablet Take 10 mg by mouth at bedtime.      No current facility-administered medications for this visit.    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 Leo Grosser who obtained an endometrial biopsy showing poorly differentiated endometrial carcinoma and negative endocervical curettage   12/12/2017 Imaging   MAMMOGRAM FINDINGS: In the right axilla, a possible mass warrants further evaluation. In the left breast, no findings suspicious for malignancy.   Images were processed with CAD.   IMPRESSION: Further evaluation is suggested for possible mass in the right axilla.     12/24/2017 Imaging   Ct scan chest, abdomen and pelvis 1. Marked thickening of the endometrium (42 mm) compatible with known primary endometrial malignancy. No evidence of extrauterine invasion. 2. No pelvic or retroperitoneal adenopathy. 3. Right middle lobe 4 mm solid pulmonary nodule, for which follow-up chest CT is advised in 3-6 months. 4. Several findings that are equivocal for distant metastatic disease. Vaguely nodular heterogeneous hyperenhancement in the peripheral right liver lobe, which could represent benign transient perfusional phenomena, with underlying liver lesions not entirely excluded. Mildly sclerotic T12 vertebral lesion. Mildly enlarged right axillary lymph node. The best single test to further evaluate these findings would be a PET-CT. Alternative tests include bone scan or thoracic MRI  without and with IV contrast for the T12 osseous lesion, MRI abdomen without and with IV contrast for the liver findings, and diagnostic mammographic evaluation for the right axillary node. 5.  Aortic Atherosclerosis (ICD10-I70.0).     01/09/2018 PET scan   1. Moderate hypermetabolism corresponding to enlarging axillary node since 12/22/2017. Highly suspicious for an atypical distribution of metastatic disease. 2. No hypermetabolism to suggest hepatic or T12 osseous metastasis. 3. Hypermetabolic endometrial primary.   01/23/2018 Initial Diagnosis   Endometrial cancer ([place])   01/23/2018 Pathology Results   1. Lymph node, sentinel, biopsy, left external iliac - NO CARCINOMA IDENTIFIED IN ONE LYMPH NODE (0/1) - SEE COMMENT 2. Lymph nodes, regional resection, right para aortic - NO CARCINOMA IDENTIFIED IN FOUR LYMPH NODES (0/4) - SEE COMMENT 3. Lymph nodes, regional resection, right pelvic - NO CARCINOMA IDENTIFIED IN EIGHT LYMPH NODES (0/8) - SEE COMMENT 4. Cul-de-sac biopsy - METASTATIC CARCINOMA 5. Uterus +/- tubes/ovaries, neoplastic, cervix, bilateral fallopian tubes and ovaries UTERUS: - MIXED SEROUS AND ENDOMETRIOID CARCINOMA - SEROSAL IMPLANTS PRESENT - LYMPHOVASCULAR SPACE INVASION PRESENT - LEIOMYOMATA (1.5 CM; LARGEST) - SEE ONCOLOGY TABLE AND COMMENT BELOW CERVIX: - BENIGN NABOTHIAN CYSTS - NO CARCINOMA IDENTIFIED BILATERAL OVARIES: - METASTATIC CARCINOMA PRESENT ON OVARIAN SURFACE BILATERAL FALLOPIAN TUBES: - INTRALUMINAL CARCINOMA Microscopic Comment 1. -3. Cytokeratin AE1/3 was performed on the sentinel lymph nodes to exclude micrometastasis. There is no evidence of metastatic carcinoma by immunohistochemistry. 5. 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  endometrioid carcinoma (~20%) Histologic Grade: N/A Myometrial invasion: Estimated less than 50% myometrial invasion (0.3 cm of myometrium involved; 1.4 cm measured thickness) Uterine Serosa Involvement: Present Cervical stromal involvement: Not identified Extent of involvement of other organs: - Fallopian tube (left within the lumen) - Ovary, left (surface involvement) - Cul-de-sac Lymphovascular invasion: Present Regional Lymph Nodes: Examined: 1 Sentinel 12 Non-sentinel 13 Total Lymph nodes with metastasis: 0 Isolated tumor cells (< 0.2 mm): 0 Micrometastasis: (> 0.2 mm and < 2.0 mm): 0 Macrometastasis: (> 2.0 mm): 0 Extracapsular extension: N/A Tumor block for ancillary studies: 5E, 5B MMR / MSI testing: Pending will be reported separately Pathologic Stage Classification (pTNM, AJCC 8th edition): pT3a, pN0 FIGO Stage: IIIA COMMENT: There is tumor present on the surface of the left ovary and within the lumen of the left fallopian tube. The carcinoma appears to be mixed with the largest component being high grade serous carcinoma. Dr. Kish reviewed the case and agrees with the above diagnosis.   01/23/2018 Surgery   Surgeon: Rossi, Emma Caroline     Operation: Robotic-assisted laparoscopic total hysterectomy with bilateral salpingoophorectomy, SLN injection, mapping and biopsy, right pelvic and para-aortic lymphadenectomy   Operative Findings:  : 10-12cm bulky uterus with frank serosal involvement on posterior cul de sac peritoneum and anterior peritoneum which adhesed the bladder to the anterior uterus. Unilateral mapping on left pelvis. No grossly suspicious nodes. Normal omentum and diaphragms.    02/01/2018 Pathology Results   Lymph node, needle/core biopsy, right axilla - METASTATIC CARCINOMA - SEE COMMENT Microscopic Comment The neoplastic cells are positive for cytokeratin 7 and Pax-8 but negative for cytokeratin 5/6, cytokeratin 20, Gata-3, p63, p53 and GCDFP. Overall,  the immunoprofile is consistent with metastasis from the patient's known gynecologic carcinoma.   02/01/2018 Procedure   Ultrasound-guided core biopsies of a suspicious right axillary lymph node.   02/21/2018 - 06/13/2018 Chemotherapy   The patient had carboplatin & Taxol x 6   03/26/2018 - 04/30/2018 Radiation Therapy   Radiation treatment dates:   03/26/18, 04/02/18, 04/19/18, 04/23/18 04/30/18   Site/dose: proximal Vagina, 6 Gy in 5 fractions for a total dose of 30 Gy     04/26/2018 PET scan   1. Interval resection of the hypermetabolic endometrial primary. No discernible hypermetabolic pelvic sidewall or peritoneal metastases. 2. Stable hypermetabolic bilateral axillary lymphadenopathy. The degree of hypermetabolism in these lymph nodes remains highly suspicious for neoplasm. 3. Interval development of low level FDG uptake in 2 stable, small lymph nodes in the left groin region. This may be reactive, but close attention recommended to exclude metastatic involvement. 4. Stable non hypermetabolic low-density liver lesions and the mixed lucent and sclerotic T12 lesion is stable without hypermetabolism today.   07/09/2018 Imaging   Status post hysterectomy.   Mild axillary lymphadenopathy, improved. Nodal metastases not excluded.   Irregular wall thickening involving the anterior bladder, possibly reflecting radiation cystitis versus tumor.   Additional stable findings as above, including a 5 mm right middle lobe nodule and stable sclerosis involving the T12 vertebral body.   10/10/2018 Imaging   1. Stable exam. No findings within the abdomen or pelvis to suggest metastatic disease. 2. Unchanged appearance of sclerosis involving the T12 vertebra. 3.  Aortic Atherosclerosis (ICD10-I70.0).   10/10/2018 Imaging   Ct abdomen and pelvis 1. Stable exam. No findings within the abdomen or   pelvis to suggest metastatic disease. 2. Unchanged appearance of sclerosis involving the T12 vertebra. 3.   Aortic Atherosclerosis (ICD10-I70.0).   01/16/2019 PET scan   1. Enlargement and increased activity in the left axillary, right axillary, and left inguinal adenopathy compatible with progressive malignancy. 2. Stable 4 mm right middle lobe subpleural nodule, not appreciably hypermetabolic. 3. Other imaging findings of potential clinical significance: Right kidney lower pole cyst. Aortic Atherosclerosis (ICD10-I70.0). Prominent stool throughout the colon favors constipation. Mild chronic left maxillary sinusitis.     02/01/2019 -  Chemotherapy   The patient had pembrolizumab and Lenvima for chemotherapy treatment.     04/25/2019 Imaging   Ct imaging 1. Interval response to therapy. Decrease in size of bilateral axillary and left inguinal lymph nodes. No new or progressive findings identified. 2. Stable sclerotic appearance of the T12 vertebra. 3. Aortic atherosclerosis. Lad coronary artery calcification noted.   08/30/2019 Imaging   1. Bulky RIGHT hilar lymph node, slightly increased in size from previous imaging. 2. Multiple foci of hyperenhancement in the liver. 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 Consider MRI liver on follow-up to establish a baseline for number of lesions as these will appear variable on CT follow-up based on phase of contrast acquisition in the future. 3. Stable 5 mm nodule adjacent to the fissure, minor fissure in the RIGHT middle lobe.     11/28/2019 Imaging   1. Stable exam. No new or progressive findings. 2. Stable enlarged right axillary lymph node. 3. Stable tiny bilateral pulmonary nodules. Continued attention on follow-up recommended. 4. Scattered hypodensities in the liver parenchyma correspond to the hypervascular lesions seen on the previous study performed  with intravenous contrast material. 5. Right renal cyst. 6. Aortic Atherosclerosis (ICD10-I70.0).     03/16/2020 Imaging   Increased echogenicity of renal parenchyma is noted bilaterally suggesting medical renal disease. No hydronephrosis or renal obstruction is noted   04/02/2020 Imaging   1. Right axillary lymphadenopathy is stable. Tiny bilateral pulmonary nodules are stable. Subcentimeter low-attenuation liver lesions are stable. 2. No new or progressive metastatic disease in the chest, abdomen or pelvis. 3. Aortic Atherosclerosis (ICD10-I70.0).   09/15/2020 Imaging   1. Stable RIGHT axillary lymph node with enlargement measuring 2 cm short axis. 2. Stable small nodule along the minor fissure in the RIGHT chest. 3. No new or progressive findings. 4. Aortic atherosclerosis.   03/24/2021 Imaging   1. Right axillary lymphadenopathy is stable compared to prior studies. 2. No other definite signs of metastatic disease elsewhere in the thorax. 3. Hepatic steatosis. Numerous hypervascular areas noted in the liver, incompletely characterized on today's arterial phase examination. These are similar to remote prior examination from 08/30/2019, likely to represent small benign lesions such as flash fill cavernous hemangiomas. These could be definitively evaluated with nonemergent abdominal MRI with and without IV gadolinium if of clinical concern. 4. Aortic atherosclerosis, in addition to left main and left anterior descending coronary artery disease. Assessment for potential risk factor modification, dietary therapy or pharmacologic therapy may be warranted, if clinically indicated.   Aortic Atherosclerosis (ICD10-I70.0).   09/16/2021 Imaging   1. Unchanged enlarged right axillary lymph node. No evidence of new lymphadenopathy or metastatic disease in the chest, abdomen, or pelvis. 2. Subcentimeter hyperenhancing lesions of the right lobe of the  liver, hepatic segments VII and VIII are unchanged,  measuring up to 0.7 cm. These are likely small benign incidental flash filling hemangiomata although incompletely characterized on this examination. Attention on follow-up. 3. Status post hysterectomy. 4. Cholelithiasis. 5. Coronary artery disease.   Aortic Atherosclerosis (ICD10-I70.0).     12/31/2021 -  Chemotherapy   Patient is on Treatment Plan : UTERINE Lenvatinib (20) D1-21 + Pembrolizumab (200) D1 q21d       PHYSICAL EXAMINATION: ECOG PERFORMANCE STATUS: 0 - Asymptomatic  Vitals:   01/21/22 0844  BP: (!) 149/86  Pulse: 76  Resp: 18  Temp: (!) 97.4 F (36.3 C)  SpO2: 100%   Filed Weights   01/21/22 0844  Weight: 146 lb 6.4 oz (66.4 kg)    GENERAL:alert, no distress and comfortable NEURO: alert & oriented x 3 with fluent speech, no focal motor/sensory deficits  LABORATORY DATA:  I have reviewed the data as listed    Component Value Date/Time   NA 143 01/21/2022 0814   K 3.5 01/21/2022 0814   CL 115 (H) 01/21/2022 0814   CO2 22 01/21/2022 0814   GLUCOSE 100 (H) 01/21/2022 0814   BUN 22 01/21/2022 0814   CREATININE 1.31 (H) 01/21/2022 0814   CALCIUM 8.6 (L) 01/21/2022 0814   PROT 5.9 (L) 01/21/2022 0814   ALBUMIN 3.5 01/21/2022 0814   AST 12 (L) 01/21/2022 0814   ALT 13 01/21/2022 0814   ALKPHOS 59 01/21/2022 0814   BILITOT 0.5 01/21/2022 0814   GFRNONAA 44 (L) 01/21/2022 0814   GFRAA 34 (L) 01/10/2020 1200    No results found for: "SPEP", "UPEP"  Lab Results  Component Value Date   WBC 4.5 01/21/2022   NEUTROABS 2.9 01/21/2022   HGB 13.3 01/21/2022   HCT 41.0 01/21/2022   MCV 90.1 01/21/2022   PLT 158 01/21/2022      Chemistry      Component Value Date/Time   NA 143 01/21/2022 0814   K 3.5 01/21/2022 0814   CL 115 (H) 01/21/2022 0814   CO2 22 01/21/2022 0814   BUN 22 01/21/2022 0814   CREATININE 1.31 (H) 01/21/2022 0814      Component Value Date/Time   CALCIUM 8.6 (L) 01/21/2022 0814   ALKPHOS 59 01/21/2022 0814   AST 12 (L)  01/21/2022 0814   ALT 13 01/21/2022 0814   BILITOT 0.5 01/21/2022 0814      

## 2022-01-21 NOTE — Patient Instructions (Signed)
[facility] MEDICAL ONCOLOGY  Discharge Instructions: Thank you for choosing [facility] to provide your oncology and hematology care.   If you have a lab appointment with the Cancer Center, please go directly to the Cancer Center and check in at the registration area.   Wear comfortable clothing and clothing appropriate for easy access to any Portacath or PICC line.   We strive to give you quality time with your provider. You may need to reschedule your appointment if you arrive late (15 or more minutes).  Arriving late affects you and other patients whose appointments are after yours.  Also, if you miss three or more appointments without notifying the office, you may be dismissed from the clinic at the provider's discretion.      For prescription refill requests, have your pharmacy contact our office and allow 72 hours for refills to be completed.    Today you received the following chemotherapy and/or immunotherapy agents: Keytruda.       To help prevent nausea and vomiting after your treatment, we encourage you to take your nausea medication as directed.  BELOW ARE SYMPTOMS THAT SHOULD BE REPORTED IMMEDIATELY: *FEVER GREATER THAN 100.4 F (38 C) OR HIGHER *CHILLS OR SWEATING *NAUSEA AND VOMITING THAT IS NOT CONTROLLED WITH YOUR NAUSEA MEDICATION *UNUSUAL SHORTNESS OF BREATH *UNUSUAL BRUISING OR BLEEDING *URINARY PROBLEMS (pain or burning when urinating, or frequent urination) *BOWEL PROBLEMS (unusual diarrhea, constipation, pain near the anus) TENDERNESS IN MOUTH AND THROAT WITH OR WITHOUT PRESENCE OF ULCERS (sore throat, sores in mouth, or a toothache) UNUSUAL RASH, SWELLING OR PAIN  UNUSUAL VAGINAL DISCHARGE OR ITCHING   Items with * indicate a potential emergency and should be followed up as soon as possible or go to the Emergency Department if any problems should occur.  Please show the CHEMOTHERAPY ALERT CARD or IMMUNOTHERAPY ALERT CARD at check-in to  the Emergency Department and triage nurse.  Should you have questions after your visit or need to cancel or reschedule your appointment, please contact [facility] MEDICAL ONCOLOGY  Dept: [phone]  and follow the prompts.  Office hours are 8:00 a.m. to 4:30 p.m. Monday - Friday. Please note that voicemails left after 4:00 p.m. may not be returned until the following business day.  We are closed weekends and major holidays. You have access to a nurse at all times for urgent questions. Please call the main number to the clinic Dept: [phone] and follow the prompts.   For any non-urgent questions, you may also contact your provider using MyChart. We now offer e-Visits for anyone 18 and older to request care online for non-urgent symptoms. For details visit [email or web address].   Also download the MyChart app! Go to the app store, search "MyChart", open the app, select [facility], and log in with your MyChart username and password.  Masks are optional in the cancer centers. If you would like for your care team to wear a mask while they are taking care of you, please let them know. You may have one support person who is at least 71 years old accompany you for your appointments. 

## 2022-01-21 NOTE — Assessment & Plan Note (Signed)
Recent renal function fluctuates up and down We discussed the importance of aggressive risk factor modification and to drink plenty of oral fluids

## 2022-01-21 NOTE — Assessment & Plan Note (Signed)
Her documented blood pressure here is high even though according to the patient, at home, her blood pressure is normal in the 120s She has history of mild proteinuria She will continue current blood pressure management 

## 2022-01-21 NOTE — Assessment & Plan Note (Signed)
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

## 2022-01-22 ENCOUNTER — Other Ambulatory Visit: Payer: Self-pay

## 2022-01-23 ENCOUNTER — Other Ambulatory Visit: Payer: Self-pay

## 2022-01-23 IMAGING — CT CT CHEST W/ CM
2 of 4 series · 14 of 36 positions shown, 17 images · IV contrast (OMNIPAQUE)
Comparison: CT of the chest 09/15/2020.

CLINICAL DATA: 70-year-old female with history of endometrial
cancer diagnosed in 7151 status post chemotherapy and radiation
therapy now complete. Evaluate for treatment response.

EXAM:
CT CHEST WITH CONTRAST
TECHNIQUE: Multidetector CT imaging of the chest was performed during
intravenous contrast administration.
CONTRAST:  60mL OMNIPAQUE IOHEXOL 350 MG/ML SOLN

[Series 2: axial st · axial · 0.71mm/px · z∈[-280,-16]mm · 11 of 154 slices shown, 14 images]
[im 11/154  mediastinal]
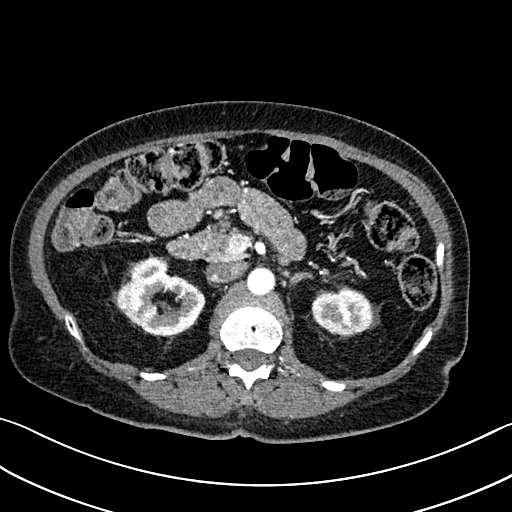
[im 11/154  lung]
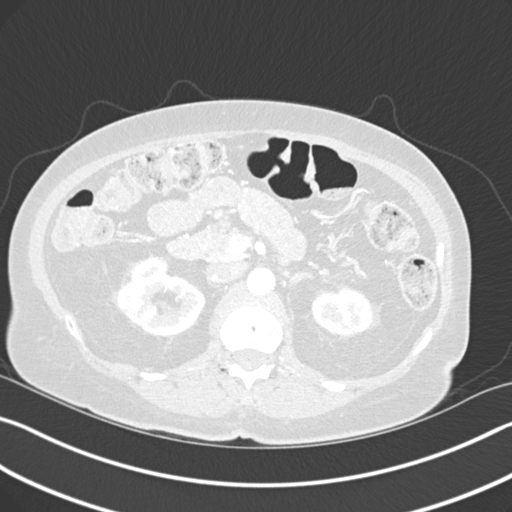
[im 22/154  lung]
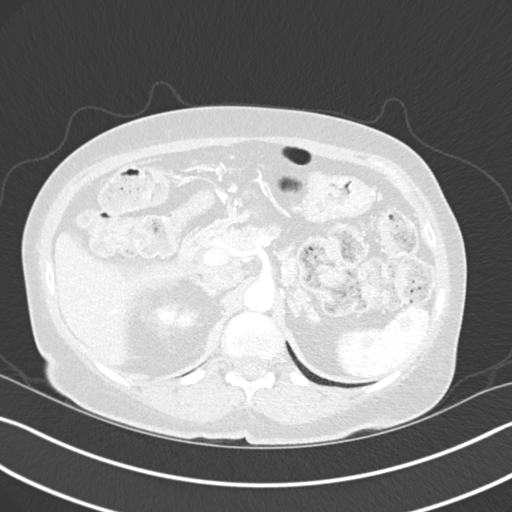
[im 33/154  lung]
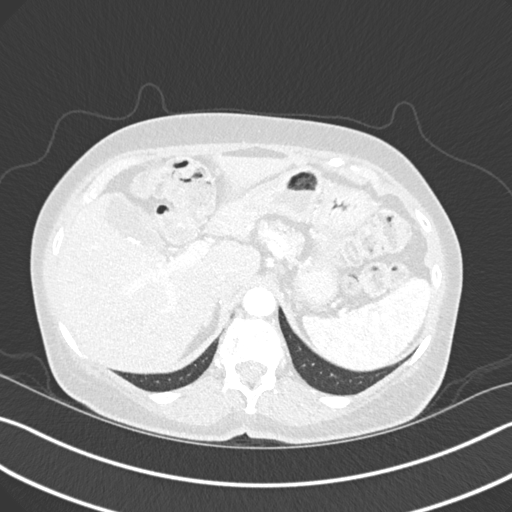
[im 55/154  lung]
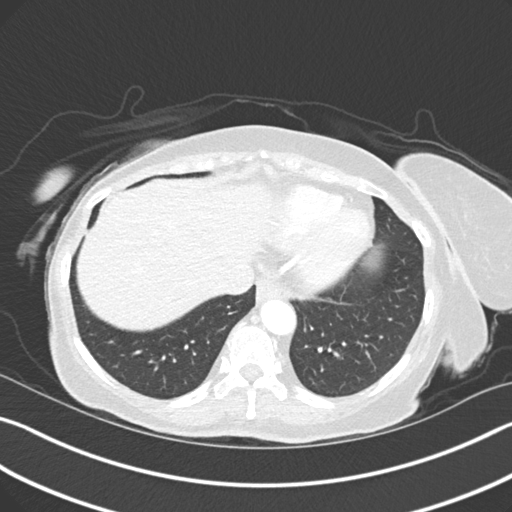
[im 66/154  mediastinal]
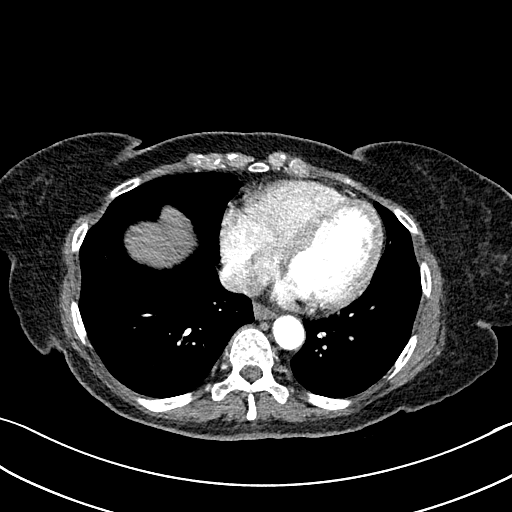
[im 66/154  lung]
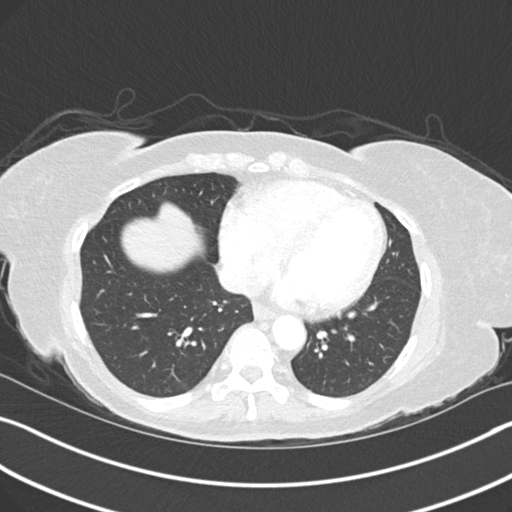
[im 77/154  lung]
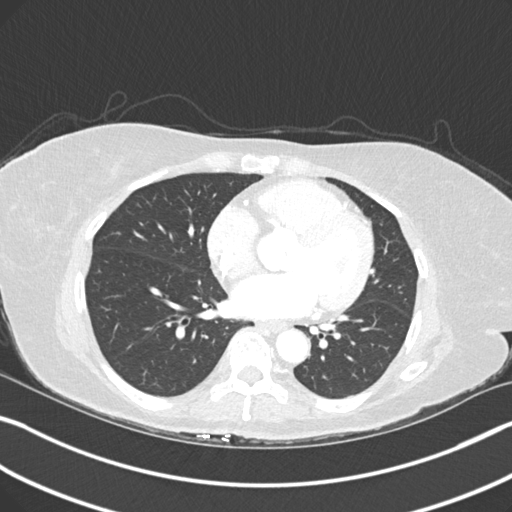
[im 88/154  lung]
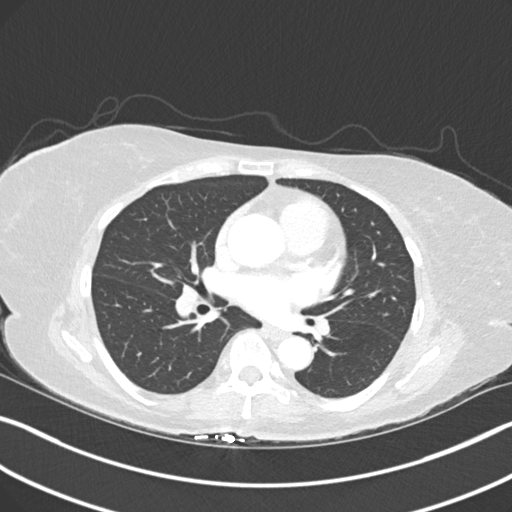
[im 99/154  lung]
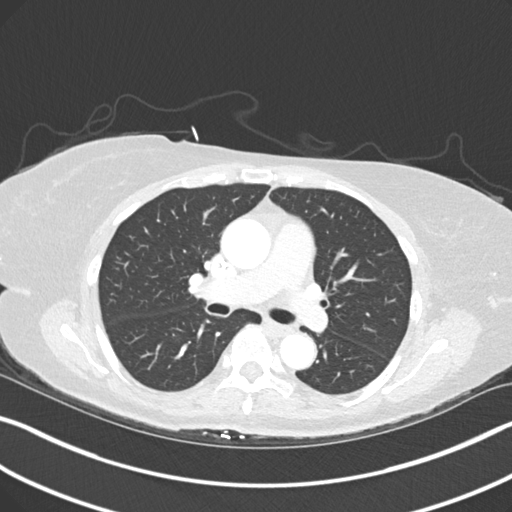
[im 121/154  mediastinal]
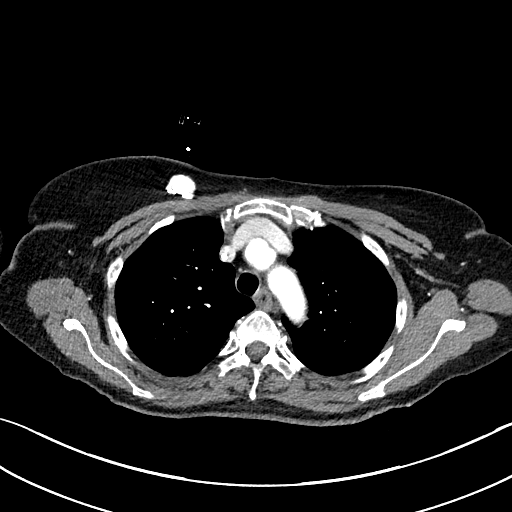
[im 121/154  lung]
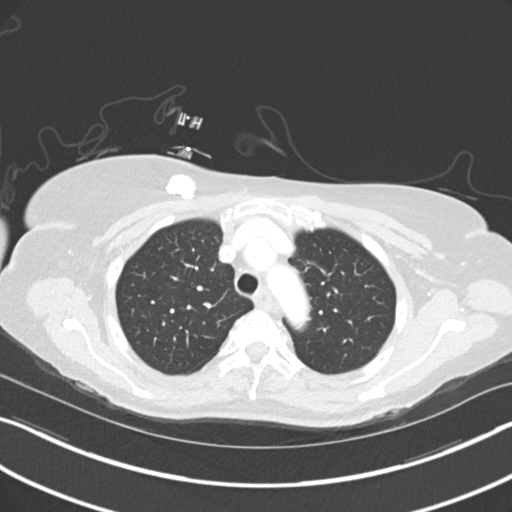
[im 132/154  lung]
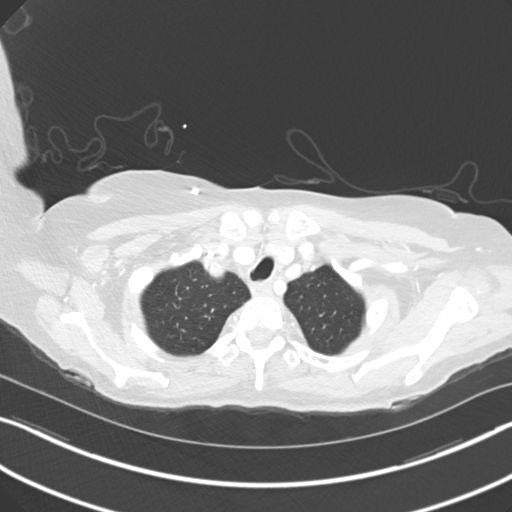
[im 143/154  lung]
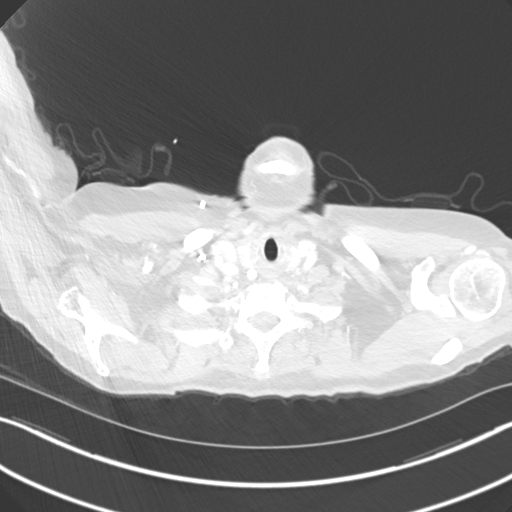

[Series 5: coronal · coronal · 0.63mm/px · 3 of 123 slices shown]
[im 25/123  lung]
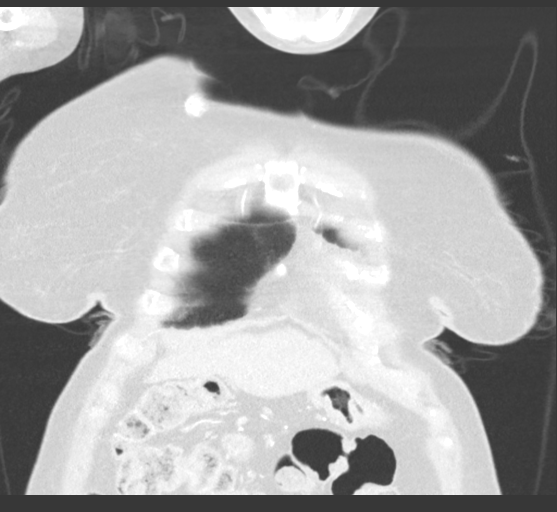
[im 49/123  lung]
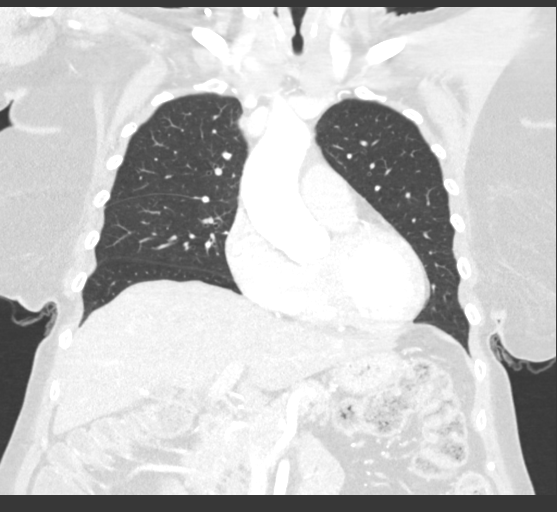
[im 74/123  lung]
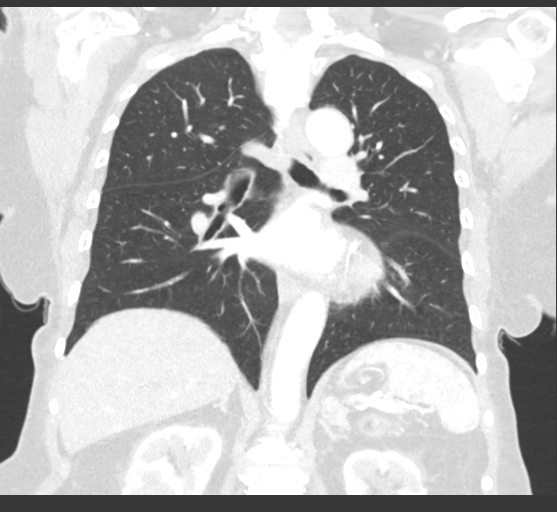

[14 of 36 positions shown; findings below may reference images not displayed]

FINDINGS: Cardiovascular: Heart size is normal. There is no significant
pericardial fluid, thickening or pericardial calcification. There is
aortic atherosclerosis, as well as atherosclerosis of the great
vessels of the mediastinum and the coronary arteries, including
calcified atherosclerotic plaque in the left main and left anterior
descending coronary arteries. Right internal jugular single-lumen
porta cath with tip terminating in the superior aspect of the right
atrium.

Mediastinum/Nodes: No pathologically enlarged mediastinal or hilar
lymph nodes. Esophagus is unremarkable in appearance. Persistent
right axillary lymphadenopathy with a single enlarged lymph node
measuring up to 1.7 cm in short axis, similar to the prior study. No
left axillary lymphadenopathy.

Lungs/Pleura: 3 mm subpleural nodule in the right middle lobe
associated with the minor fissure (axial image 62 of series 7),
stable, likely a benign subpleural lymph node. No other larger more
suspicious appearing pulmonary nodules or masses are noted. No acute
consolidative airspace disease. No pleural effusions.

Upper Abdomen: Diffuse low attenuation throughout the visualized
hepatic parenchyma, indicative of a background of hepatic steatosis.
There are multiple hypervascular lesions scattered throughout the
hepatic parenchyma, incompletely characterized on today's arterial
phase examination, largest of which is in the periphery of segment 8
(axial image 104 of series 2) measuring 1.6 x 1.0 cm.

Musculoskeletal: Hemangioma in T12 vertebral body again noted. There
are no aggressive appearing lytic or blastic lesions noted in the
visualized portions of the skeleton.
IMPRESSION: 1. Right axillary lymphadenopathy is stable compared to prior
studies.
2. No other definite signs of metastatic disease elsewhere in the
thorax.
3. Hepatic steatosis. Numerous hypervascular areas noted in the
liver, incompletely characterized on today's arterial phase
examination. These are similar to remote prior examination from
08/30/2019, likely to represent small benign lesions such as flash
fill cavernous hemangiomas. These could be definitively evaluated
with nonemergent abdominal MRI with and without IV gadolinium if of
clinical concern.
4. Aortic atherosclerosis, in addition to left main and left
anterior descending coronary artery disease. Assessment for
potential risk factor modification, dietary therapy or pharmacologic
therapy may be warranted, if clinically indicated.

Aortic Atherosclerosis ([id number]).

## 2022-01-31 ENCOUNTER — Telehealth: Payer: Self-pay | Admitting: Podiatry

## 2022-01-31 NOTE — Telephone Encounter (Signed)
spk w/ pt to r/s appt.Marland KitchenMarland Kitchenpt will cb

## 2022-02-01 ENCOUNTER — Other Ambulatory Visit: Payer: Self-pay

## 2022-02-08 ENCOUNTER — Ambulatory Visit: Payer: Medicare Other | Admitting: Podiatry

## 2022-02-09 ENCOUNTER — Other Ambulatory Visit (HOSPITAL_COMMUNITY): Payer: Self-pay

## 2022-02-09 ENCOUNTER — Telehealth: Payer: Self-pay | Admitting: Pharmacy Technician

## 2022-02-09 NOTE — Telephone Encounter (Signed)
Oral Oncology Patient Advocate Encounter   Received notification that patient is due for re-enrollment for assistance for [place] through [place].   Re-enrollment process has been initiated and will be submitted upon completion of necessary documents.  Patient agreed to meet on 02/11/2022 to sign required documents.  [facility] phone number [phone].   I will continue to follow until final determination.  Lady Deutscher, CPhT-Adv Oncology Pharmacy Patient [place] Direct Number: [phone]  Fax: [phone]

## 2022-02-11 ENCOUNTER — Other Ambulatory Visit: Payer: Self-pay

## 2022-02-11 ENCOUNTER — Inpatient Hospital Stay: Payer: Medicare Other

## 2022-02-11 VITALS — BP 153/92 | HR 73 | Temp 97.8°F | Resp 17 | Wt 146.6 lb

## 2022-02-11 DIAGNOSIS — Z7189 Other specified counseling: Secondary | ICD-10-CM

## 2022-02-11 DIAGNOSIS — C773 Secondary and unspecified malignant neoplasm of axilla and upper limb lymph nodes: Secondary | ICD-10-CM

## 2022-02-11 DIAGNOSIS — C541 Malignant neoplasm of endometrium: Secondary | ICD-10-CM | POA: Diagnosis not present

## 2022-02-11 DIAGNOSIS — I129 Hypertensive chronic kidney disease with stage 1 through stage 4 chronic kidney disease, or unspecified chronic kidney disease: Secondary | ICD-10-CM | POA: Diagnosis not present

## 2022-02-11 DIAGNOSIS — N183 Chronic kidney disease, stage 3 unspecified: Secondary | ICD-10-CM | POA: Diagnosis not present

## 2022-02-11 DIAGNOSIS — Z5112 Encounter for antineoplastic immunotherapy: Secondary | ICD-10-CM | POA: Diagnosis not present

## 2022-02-11 DIAGNOSIS — Z79899 Other long term (current) drug therapy: Secondary | ICD-10-CM | POA: Diagnosis not present

## 2022-02-11 LAB — CMP (CANCER CENTER ONLY)
ALT: 15 U/L (ref 0–44)
AST: 15 U/L (ref 15–41)
Albumin: 3.7 g/dL (ref 3.5–5.0)
Alkaline Phosphatase: 60 U/L (ref 38–126)
Anion gap: 6 (ref 5–15)
BUN: 20 mg/dL (ref 8–23)
CO2: 23 mmol/L (ref 22–32)
Calcium: 8.5 mg/dL — ABNORMAL LOW (ref 8.9–10.3)
Chloride: 114 mmol/L — ABNORMAL HIGH (ref 98–111)
Creatinine: 1.16 mg/dL — ABNORMAL HIGH (ref 0.44–1.00)
GFR, Estimated: 50 mL/min — ABNORMAL LOW (ref >60)
Glucose, Bld: 104 mg/dL — ABNORMAL HIGH (ref 70–99)
Potassium: 3.4 mmol/L — ABNORMAL LOW (ref 3.5–5.1)
Sodium: 143 mmol/L (ref 135–145)
Total Bilirubin: 0.5 mg/dL (ref 0.3–1.2)
Total Protein: 6.3 g/dL — ABNORMAL LOW (ref 6.5–8.1)

## 2022-02-11 LAB — CBC WITH DIFFERENTIAL (CANCER CENTER ONLY)
Abs Immature Granulocytes: 0.02 10*3/uL (ref 0.00–0.07)
Basophils Absolute: 0 10*3/uL (ref 0.0–0.1)
Basophils Relative: 0 %
Eosinophils Absolute: 0 10*3/uL (ref 0.0–0.5)
Eosinophils Relative: 1 %
HCT: 43.1 % (ref 36.0–46.0)
Hemoglobin: 13.9 g/dL (ref 12.0–15.0)
Immature Granulocytes: 1 %
Lymphocytes Relative: 30 %
Lymphs Abs: 1.3 10*3/uL (ref 0.7–4.0)
MCH: 29.2 pg (ref 26.0–34.0)
MCHC: 32.3 g/dL (ref 30.0–36.0)
MCV: 90.5 fL (ref 80.0–100.0)
Monocytes Absolute: 0.2 10*3/uL (ref 0.1–1.0)
Monocytes Relative: 5 %
Neutro Abs: 2.7 10*3/uL (ref 1.7–7.7)
Neutrophils Relative %: 63 %
Platelet Count: 192 10*3/uL (ref 150–400)
RBC: 4.76 MIL/uL (ref 3.87–5.11)
RDW: 14.6 % (ref 11.5–15.5)
WBC Count: 4.3 10*3/uL (ref 4.0–10.5)
nRBC: 0 % (ref 0.0–0.2)

## 2022-02-11 LAB — TSH: TSH: 6.05 u[IU]/mL — ABNORMAL HIGH (ref 0.350–4.500)

## 2022-02-11 MED ORDER — HEPARIN SOD (PORK) LOCK FLUSH 100 UNIT/ML IV SOLN
500.0000 [IU] | Freq: Once | INTRAVENOUS | Status: AC | PRN
  Administered 2022-02-11: 500 [IU]

## 2022-02-11 MED ORDER — SODIUM CHLORIDE 0.9% FLUSH
10.0000 mL | INTRAVENOUS | Status: DC | PRN
  Administered 2022-02-11: 10 mL

## 2022-02-11 MED ORDER — SODIUM CHLORIDE 0.9 % IV SOLN
200.0000 mg | Freq: Once | INTRAVENOUS | Status: AC
  Administered 2022-02-11: 200 mg via INTRAVENOUS
  Filled 2022-02-11: qty 200

## 2022-02-11 MED ORDER — SODIUM CHLORIDE 0.9 % IV SOLN: Freq: Once | INTRAVENOUS | Status: AC

## 2022-02-11 MED ORDER — SODIUM CHLORIDE 0.9% FLUSH
10.0000 mL | Freq: Once | INTRAVENOUS | Status: AC
  Administered 2022-02-11: 10 mL

## 2022-02-11 NOTE — Telephone Encounter (Signed)
Oral Oncology Patient Advocate Encounter   Met with patient in lobby to collect paperwork requested by [employer].  Completed application submitted via fax to [phone] on 02/11/22.    [facility] phone number [phone].   I will continue to check the status until final determination.   Krista Johnson, CPhT-Adv Oncology Pharmacy Patient [place] Direct Number: [phone]  Fax: [phone]

## 2022-02-11 NOTE — Patient Instructions (Signed)
[facility]COL[facility]  Discharge Instructions: Thank you for choosing [facility] to provide your oncology and hematology care.   If you have a lab appointment with the Cancer Center, please go directly to the Cancer Center and check in at the registration area.   Wear comfortable clothing and clothing appropriate for easy access to any Portacath or PICC line.   We strive to give you quality time with your provider. You may need to reschedule your appointment if you arrive late (15 or more minutes).  Arriving late affects you and other patients whose appointments are after yours.  Also, if you miss three or more appointments without notifying the office, you may be dismissed from the clinic at the provider's discretion.      For prescription refill requests, have your pharmacy contact our office and allow 72 hours for refills to be completed.    Today you received the following chemotherapy and/or immunotherapy agents: Keytruda.       To help prevent nausea and vomiting after your treatment, we encourage you to take your nausea medication as directed.  BELOW ARE SYMPTOMS THAT SHOULD BE REPORTED IMMEDIATELY: *FEVER GREATER THAN 100.4 F (38 C) OR HIGHER *CHILLS OR SWEATING *NAUSEA AND VOMITING THAT IS NOT CONTROLLED WITH YOUR NAUSEA MEDICATION *UNUSUAL SHORTNESS OF BREATH *UNUSUAL BRUISING OR BLEEDING *URINARY PROBLEMS (pain or burning when urinating, or frequent urination) *BOWEL PROBLEMS (unusual diarrhea, constipation, pain near the anus) TENDERNESS IN MOUTH AND THROAT WITH OR WITHOUT PRESENCE OF ULCERS (sore throat, sores in mouth, or a toothache) UNUSUAL RASH, SWELLING OR PAIN  UNUSUAL VAGINAL DISCHARGE OR ITCHING   Items with * indicate a potential emergency and should be followed up as soon as possible or go to the Emergency Department if any problems should occur.  Please show the CHEMOTHERAPY ALERT CARD or IMMUNOTHERAPY ALERT CARD at check-in to  the Emergency Department and triage nurse.  Should you have questions after your visit or need to cancel or reschedule your appointment, please contact [facility]  Dept: [phone]  and follow the prompts.  Office hours are 8:00 a.m. to 4:30 p.m. Monday - Friday. Please note that voicemails left after 4:00 p.m. may not be returned until the following business day.  We are closed weekends and major holidays. You have access to a nurse at all times for urgent questions. Please call the main number to the clinic Dept: [phone] and follow the prompts.   For any non-urgent questions, you may also contact your provider using MyChart. We now offer e-Visits for anyone 18 and older to request care online for non-urgent symptoms. For details visit [email or web address].   Also download the MyChart app! Go to the app store, search "MyChart", open the app, select , and log in with your MyChart username and password.  Masks are optional in the cancer centers. If you would like for your care team to wear a mask while they are taking care of you, please let them know. You may have one support person who is at least 71 years old accompany you for your appointments. 

## 2022-02-13 LAB — T4: T4, Total: 7.3 ug/dL (ref 4.5–12.0)

## 2022-02-15 ENCOUNTER — Encounter: Payer: Self-pay | Admitting: Podiatry

## 2022-02-15 ENCOUNTER — Ambulatory Visit (INDEPENDENT_AMBULATORY_CARE_PROVIDER_SITE_OTHER): Payer: Medicare Other | Admitting: Podiatry

## 2022-02-15 DIAGNOSIS — N1831 Chronic kidney disease, stage 3a: Secondary | ICD-10-CM | POA: Diagnosis not present

## 2022-02-15 DIAGNOSIS — E1122 Type 2 diabetes mellitus with diabetic chronic kidney disease: Secondary | ICD-10-CM

## 2022-02-15 DIAGNOSIS — M2041 Other hammer toe(s) (acquired), right foot: Secondary | ICD-10-CM | POA: Diagnosis not present

## 2022-02-15 DIAGNOSIS — L84 Corns and callosities: Secondary | ICD-10-CM

## 2022-02-15 DIAGNOSIS — M79674 Pain in right toe(s): Secondary | ICD-10-CM

## 2022-02-15 DIAGNOSIS — Q828 Other specified congenital malformations of skin: Secondary | ICD-10-CM

## 2022-02-15 DIAGNOSIS — M2042 Other hammer toe(s) (acquired), left foot: Secondary | ICD-10-CM

## 2022-02-15 DIAGNOSIS — M79675 Pain in left toe(s): Secondary | ICD-10-CM

## 2022-02-15 DIAGNOSIS — B351 Tinea unguium: Secondary | ICD-10-CM | POA: Diagnosis not present

## 2022-02-15 DIAGNOSIS — E119 Type 2 diabetes mellitus without complications: Secondary | ICD-10-CM

## 2022-02-15 NOTE — Progress Notes (Signed)
ANNUAL DIABETIC FOOT EXAM  Subjective: Krista Johnson presents today for annual diabetic foot examination.  Chief Complaint  Patient presents with   Nail Problem    DFC  BG - 68 this morning  A1C - per pt her range is 5.8-6.2 PCP - Dr Cheron Schaumann     Patient confirms h/o diabetes.  Patient relates 10 year h/o diabetes.  Patient denies any h/o foot wounds.  Patient denies any numbness, tingling, burning, or pins/needle sensation in feet.  Risk factors: diabetes, HTN, CKD, hyperlipidemia, diabetic renal disease.  Jonathon Jordan, MD is patient's PCP. Last visit was December 31, 2020.  Past Medical History:  Diagnosis Date   Arthritis    right knee--- last cortisone injection 04/ 2019   CKD (chronic kidney disease)    Diabetes mellitus without complication [facility])    endocrinologist-  dr Loanne Drilling   Endometrial cancer [facility])    History of colon polyps    Hyperlipidemia    Hyperparathyroidism ([place])    Hypertension    Patient Active Problem List   Diagnosis Date Noted   Osteopenia 11/02/2021   Hypocalcemia 06/04/2021   Hemorrhoids 06/04/2021   Abnormal renal function 04/08/2020   Allergic rhinitis 04/08/2020   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[place]) 04/08/2020   Body mass index (BMI) 32.0-32.9, adult 04/08/2020   Decreased estrogen level 04/08/2020   Diabetic retinopathy ([place]) 04/08/2020   Hyperlipidemia 04/08/2020   Postmenopausal bleeding 04/08/2020   Type 2 diabetes mellitus with hyperglycemia ([place]) 04/08/2020   Vitamin D deficiency 04/08/2020   Diabetic renal disease ([place]) 04/08/2020   Acute renal failure (ARF) ([place]) 03/13/2020   Hypotension due to drugs 02/21/2020   Multiple lung nodules on CT 11/29/2019   Hyperparathyroidism ([place]) 05/29/2019   Acquired hypothyroidism 04/05/2019   Diabetes ([place]) 04/02/2019   Essential hypertension 02/22/2019   Lymphadenopathy, axillary 01/07/2019   Lymphadenopathy, inguinal 01/07/2019   Dysuria 07/10/2018   Anemia, chronic  disease 05/01/2018   Pancytopenia, acquired ([place]) 03/19/2018   Other constipation 03/19/2018   Chronic kidney disease (CKD), stage III (moderate) ([place]) 02/09/2018   Goals of care, counseling/discussion 02/09/2018   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   Endometrial cancer ([place]) 01/23/2018   Hypercalcemia 12/26/2017   Primary osteoarthritis of right knee 07/27/2017   Chronic pain of both knees 09/20/2016   Past Surgical History:  Procedure Laterality Date   COLONOSCOPY  last one 12-25-2017   IR IMAGING GUIDED PORT INSERTION  02/20/2018   ROBOTIC ASSISTED TOTAL HYSTERECTOMY WITH BILATERAL SALPINGO OOPHERECTOMY N/A 01/23/2018   Procedure: XI ROBOTIC ASSISTED TOTAL HYSTERECTOMY WITH BILATERAL SALPINGO OOPHORECTOMY;  Surgeon: Everitt Amber, MD;  Location: [facility];  Service: Gynecology;  Laterality: N/A;   SENTINEL NODE BIOPSY N/A 01/23/2018   Procedure: SENTINEL NODE BIOPSY;  Surgeon: Everitt Amber, MD;  Location: [facility];  Service: Gynecology;  Laterality: N/A;   Current Outpatient Medications on File Prior to Visit  Medication Sig Dispense Refill   amLODipine-valsartan (EXFORGE) 5-160 MG tablet Take 1 tablet by mouth daily.     atenolol (TENORMIN) 25 MG tablet Take 1 tablet (25 mg total) by mouth daily. 90 tablet 1   Azelastine HCl 137 MCG/SPRAY SOLN Place into both nostrils.     Calcium Carb-Cholecalciferol 600-800 MG-UNIT TABS Take 1 tablet by mouth daily. (Patient not taking: Reported on 12/09/2021)     calcium carbonate (TUMS - DOSED IN MG ELEMENTAL CALCIUM) 500 MG chewable tablet Chew 2 tablets by mouth 2 (two) times daily.     cholecalciferol (  VITAMIN D3) 25 MCG (1000 UNIT) tablet Take 5 tablets (5,000 Units total) by mouth daily. 30 tablet 2   ergocalciferol (VITAMIN D2) 1.25 MG (50000 UT) capsule Take 1 capsule (50,000 Units total) by mouth once a week. 25 capsule 1   glucose blood (ONETOUCH VERIO) test strip Use as instructed to check blood sugars 3 times a day. 100 each 3    lenvatinib 10 mg daily dose (LENVIMA, 10 MG DAILY DOSE,) capsule Take 1 capsule (10 mg total) by mouth daily. 30 capsule 11   levothyroxine (EUTHYROX) 200 MCG tablet Take 1 tablet (200 mcg total) by mouth daily before breakfast. 90 tablet 3   levothyroxine (EUTHYROX) 50 MCG tablet Take 1 tablet (50 mcg total) by mouth daily before breakfast. 90 tablet 3   lidocaine-prilocaine (EMLA) cream Apply topically daily as needed. 30 g 3   OneTouch Delica Lancets 42P MISC 1 each by Other route 2 (two) times daily. Use to monitor glucose levels BID; E11.65 100 each 2   RELION PEN NEEDLES 32G X 4 MM MISC USE 1 PEN PER DAY 50 each 5   simvastatin (ZOCOR) 10 MG tablet Take 10 mg by mouth at bedtime.      No current facility-administered medications on file prior to visit.    Allergies  Allergen Reactions   Sulfa Antibiotics Nausea Only   Sulfamethoxazole Nausea Only   Social History   Occupational History   Occupation: retired Education officer, [employer]  Tobacco Use   Smoking status: Never   Smokeless tobacco: Never  Vaping Use   Vaping Use: Never used  Substance and Sexual Activity   Alcohol use: Not Currently    Alcohol/week: 0.0 standard drinks of alcohol   Drug use: Never   Sexual activity: Not on file   Family History  Problem Relation Age of Onset   Diabetes Mother    Hypertension Mother    Diabetes Sister    Hypertension Sister    Diabetes Maternal Uncle    Colon cancer Paternal Aunt    Diabetes Paternal Aunt    Stomach cancer Neg Hx    Rectal cancer Neg Hx    Esophageal cancer Neg Hx    Colon polyps Neg Hx    Immunization History  Administered Date(s) Administered   Moderna SARS-COV2 Booster Vaccination 02/08/2020, 09/24/2020   Moderna Sars-Covid-2 Vaccination 06/01/2019, 06/29/2019     Review of Systems: Negative except as noted in the HPI.   Objective: There were no vitals filed for this visit.  Krista Johnson is a pleasant 71 y.o. female in NAD. AAO X 3.  Vascular  Examination: Capillary refill time immediate b/l. Vascular status intact b/l with palpable pedal pulses. Pedal hair present b/l. No edema. No pain with calf compression b/l. Skin temperature gradient WNL b/l. No cyanosis or clubbing noted b/l LE.  Neurological Examination: Sensation grossly intact b/l with 10 gram monofilament. Vibratory sensation diminished b/l.  Dermatological Examination: Pedal skin with normal turgor, texture and tone b/l. Toenails 1-5 b/l thick, discolored, elongated with subungual debris and pain on dorsal palpation. Hyperkeratotic lesion(s) plantar heel pad of both feet, plantar IPJ of left great toe, plantar IPJ of right great toe, and 5th met base right lower extremity.  No erythema, no edema, no drainage, no fluctuance. Porokeratotic lesion(s) dorsal PIPJ of L 5th toe and submet head 4 b/l. No erythema, no edema, no drainage, no fluctuance.  Musculoskeletal Examination: Normal muscle strength 5/5 to all lower extremity muscle groups bilaterally. Hammertoe deformity noted  2-5 b/l.Marland Kitchen No pain, crepitus or joint limitation noted with ROM b/l LE.  Patient ambulates independently without assistive aids.  Radiographs: None  Last A1c:      Latest Ref Rng & Units 10/06/2021   10:37 AM 05/27/2021   10:01 AM  Hemoglobin A1C  Hemoglobin-A1c 4.0 - 5.6 % 5.8  6.2    Footwear Assessment: Does the patient wear appropriate shoes? Yes. Does the patient need inserts/orthotics? Yes.  ADA Risk Categorization: Low Risk :  Patient has all of the following: Intact protective sensation No prior foot ulcer  No severe deformity Pedal pulses present  Assessment: 1. Pain due to onychomycosis of toenails of both feet   2. Porokeratosis   3. Callus   4. Acquired hammertoes of both feet   5. Type 2 diabetes mellitus with stage 3a chronic kidney disease, without long-term current use of insulin ([place])   6. Encounter for diabetic foot exam ([place])      Plan: -Consent given for treatment  as described below: -Examined patient. -Diabetic foot examination performed today. -Continue diabetic foot care principles: inspect feet daily, monitor glucose as recommended by PCP and/or Endocrinologist, and follow prescribed diet per PCP, Endocrinologist and/or dietician. -Toenails 1-5 b/l were debrided in length and girth with sterile nail nippers and dremel without iatrogenic bleeding.  -Callus(es) bilateral heels, bilateral great toes, and 5th met base right lower extremity pared utilizing sharp debridement with sterile blade without complication or incident. Total number debrided =5. -Porokeratotic lesion(s) L 5th toe and submet head 4 b/l pared and enucleated with sterile currette without incident. Total number of lesions debrided=3. -Patient/POA to call should there be question/concern in the interim. Return in about 3 months (around 05/18/2022).  Marzetta Board, DPM

## 2022-02-16 ENCOUNTER — Other Ambulatory Visit: Payer: Self-pay

## 2022-03-04 ENCOUNTER — Inpatient Hospital Stay: Payer: Medicare Other | Attending: Gynecology

## 2022-03-04 ENCOUNTER — Inpatient Hospital Stay (HOSPITAL_BASED_OUTPATIENT_CLINIC_OR_DEPARTMENT_OTHER): Payer: Medicare Other | Admitting: Hematology and Oncology

## 2022-03-04 ENCOUNTER — Encounter: Payer: Self-pay | Admitting: Hematology and Oncology

## 2022-03-04 ENCOUNTER — Other Ambulatory Visit: Payer: Self-pay

## 2022-03-04 ENCOUNTER — Inpatient Hospital Stay: Payer: Medicare Other

## 2022-03-04 VITALS — BP 158/90 | HR 81 | Temp 97.8°F | Resp 18 | Ht 66.0 in | Wt 147.6 lb

## 2022-03-04 DIAGNOSIS — C541 Malignant neoplasm of endometrium: Secondary | ICD-10-CM | POA: Insufficient documentation

## 2022-03-04 DIAGNOSIS — Z7189 Other specified counseling: Secondary | ICD-10-CM

## 2022-03-04 DIAGNOSIS — Z5112 Encounter for antineoplastic immunotherapy: Secondary | ICD-10-CM | POA: Diagnosis not present

## 2022-03-04 DIAGNOSIS — E039 Hypothyroidism, unspecified: Secondary | ICD-10-CM | POA: Diagnosis not present

## 2022-03-04 DIAGNOSIS — I10 Essential (primary) hypertension: Secondary | ICD-10-CM | POA: Insufficient documentation

## 2022-03-04 DIAGNOSIS — C773 Secondary and unspecified malignant neoplasm of axilla and upper limb lymph nodes: Secondary | ICD-10-CM

## 2022-03-04 LAB — CMP (CANCER CENTER ONLY)
ALT: 14 U/L (ref 0–44)
AST: 15 U/L (ref 15–41)
Albumin: 3.7 g/dL (ref 3.5–5.0)
Alkaline Phosphatase: 61 U/L (ref 38–126)
Anion gap: 7 (ref 5–15)
BUN: 19 mg/dL (ref 8–23)
CO2: 24 mmol/L (ref 22–32)
Calcium: 8.2 mg/dL — ABNORMAL LOW (ref 8.9–10.3)
Chloride: 111 mmol/L (ref 98–111)
Creatinine: 0.99 mg/dL (ref 0.44–1.00)
GFR, Estimated: >60 mL/min (ref >60)
Glucose, Bld: 122 mg/dL — ABNORMAL HIGH (ref 70–99)
Potassium: 3.4 mmol/L — ABNORMAL LOW (ref 3.5–5.1)
Sodium: 142 mmol/L (ref 135–145)
Total Bilirubin: 0.5 mg/dL (ref 0.3–1.2)
Total Protein: 6 g/dL — ABNORMAL LOW (ref 6.5–8.1)

## 2022-03-04 LAB — CBC WITH DIFFERENTIAL (CANCER CENTER ONLY)
Abs Immature Granulocytes: 0.01 10*3/uL (ref 0.00–0.07)
Basophils Absolute: 0 10*3/uL (ref 0.0–0.1)
Basophils Relative: 0 %
Eosinophils Absolute: 0 10*3/uL (ref 0.0–0.5)
Eosinophils Relative: 1 %
HCT: 43.3 % (ref 36.0–46.0)
Hemoglobin: 13.7 g/dL (ref 12.0–15.0)
Immature Granulocytes: 0 %
Lymphocytes Relative: 30 %
Lymphs Abs: 1.2 10*3/uL (ref 0.7–4.0)
MCH: 29.1 pg (ref 26.0–34.0)
MCHC: 31.6 g/dL (ref 30.0–36.0)
MCV: 91.9 fL (ref 80.0–100.0)
Monocytes Absolute: 0.3 10*3/uL (ref 0.1–1.0)
Monocytes Relative: 7 %
Neutro Abs: 2.4 10*3/uL (ref 1.7–7.7)
Neutrophils Relative %: 62 %
Platelet Count: 176 10*3/uL (ref 150–400)
RBC: 4.71 MIL/uL (ref 3.87–5.11)
RDW: 14.5 % (ref 11.5–15.5)
WBC Count: 3.9 10*3/uL — ABNORMAL LOW (ref 4.0–10.5)
nRBC: 0 % (ref 0.0–0.2)

## 2022-03-04 LAB — TOTAL PROTEIN, URINE DIPSTICK: Protein, ur: 30 mg/dL — AB

## 2022-03-04 MED ORDER — SODIUM CHLORIDE 0.9 % IV SOLN
200.0000 mg | Freq: Once | INTRAVENOUS | Status: AC
  Administered 2022-03-04: 200 mg via INTRAVENOUS
  Filled 2022-03-04: qty 200

## 2022-03-04 MED ORDER — SODIUM CHLORIDE 0.9% FLUSH
10.0000 mL | Freq: Once | INTRAVENOUS | Status: AC
  Administered 2022-03-04: 10 mL

## 2022-03-04 MED ORDER — HEPARIN SOD (PORK) LOCK FLUSH 100 UNIT/ML IV SOLN
500.0000 [IU] | Freq: Once | INTRAVENOUS | Status: AC | PRN
  Administered 2022-03-04: 500 [IU]

## 2022-03-04 MED ORDER — SODIUM CHLORIDE 0.9% FLUSH
10.0000 mL | INTRAVENOUS | Status: DC | PRN
  Administered 2022-03-04: 10 mL

## 2022-03-04 MED ORDER — SODIUM CHLORIDE 0.9 % IV SOLN: Freq: Once | INTRAVENOUS | Status: AC

## 2022-03-04 NOTE — Patient Instructions (Signed)
[facility]COL[facility]  Discharge Instructions: Thank you for choosing [facility] to provide your oncology and hematology care.   If you have a lab appointment with the Cancer Center, please go directly to the Cancer Center and check in at the registration area.   Wear comfortable clothing and clothing appropriate for easy access to any Portacath or PICC line.   We strive to give you quality time with your provider. You may need to reschedule your appointment if you arrive late (15 or more minutes).  Arriving late affects you and other patients whose appointments are after yours.  Also, if you miss three or more appointments without notifying the office, you may be dismissed from the clinic at the provider's discretion.      For prescription refill requests, have your pharmacy contact our office and allow 72 hours for refills to be completed.    Today you received the following chemotherapy and/or immunotherapy agents: Keytruda.       To help prevent nausea and vomiting after your treatment, we encourage you to take your nausea medication as directed.  BELOW ARE SYMPTOMS THAT SHOULD BE REPORTED IMMEDIATELY: *FEVER GREATER THAN 100.4 F (38 C) OR HIGHER *CHILLS OR SWEATING *NAUSEA AND VOMITING THAT IS NOT CONTROLLED WITH YOUR NAUSEA MEDICATION *UNUSUAL SHORTNESS OF BREATH *UNUSUAL BRUISING OR BLEEDING *URINARY PROBLEMS (pain or burning when urinating, or frequent urination) *BOWEL PROBLEMS (unusual diarrhea, constipation, pain near the anus) TENDERNESS IN MOUTH AND THROAT WITH OR WITHOUT PRESENCE OF ULCERS (sore throat, sores in mouth, or a toothache) UNUSUAL RASH, SWELLING OR PAIN  UNUSUAL VAGINAL DISCHARGE OR ITCHING   Items with * indicate a potential emergency and should be followed up as soon as possible or go to the Emergency Department if any problems should occur.  Please show the CHEMOTHERAPY ALERT CARD or IMMUNOTHERAPY ALERT CARD at check-in to  the Emergency Department and triage nurse.  Should you have questions after your visit or need to cancel or reschedule your appointment, please contact [facility]  Dept: [phone]  and follow the prompts.  Office hours are 8:00 a.m. to 4:30 p.m. Monday - Friday. Please note that voicemails left after 4:00 p.m. may not be returned until the following business day.  We are closed weekends and major holidays. You have access to a nurse at all times for urgent questions. Please call the main number to the clinic Dept: [phone] and follow the prompts.   For any non-urgent questions, you may also contact your provider using MyChart. We now offer e-Visits for anyone 18 and older to request care online for non-urgent symptoms. For details visit [email or web address].   Also download the MyChart app! Go to the app store, search "MyChart", open the app, select [facility], and log in with your MyChart username and password.  Masks are optional in the cancer centers. If you would like for your care team to wear a mask while they are taking care of you, please let them know. You may have one support person who is at least 71 years old accompany you for your appointments. 

## 2022-03-04 NOTE — Assessment & Plan Note (Signed)
Her last few multiple imaging studies showed persistent lymphadenopathy in the right axilla, stable Overall, while she does not have complete response, combination of pembrolizumab and lenvatinib is controlling her disease I plan to repeat imaging study again in December We discussed possibility of taking a treatment break if her CT imaging shows stability

## 2022-03-04 NOTE — Assessment & Plan Note (Signed)
Her documented blood pressure here is high even though according to the patient, at home, her blood pressure is normal in the 120s She has history of mild proteinuria She will continue current blood pressure management

## 2022-03-04 NOTE — Progress Notes (Signed)
[place] OFFICE PROGRESS NOTE  Patient Care Team: Jonathon Jordan, MD as PCP - General (Family Medicine)  ASSESSMENT & PLAN:  Endometrial cancer [facility]) Her last few multiple imaging studies showed persistent lymphadenopathy in the right axilla, stable Overall, while she does not have complete response, combination of pembrolizumab and lenvatinib is controlling her disease I plan to repeat imaging study again in December We discussed possibility of taking a treatment break if her CT imaging shows stability  Acquired hypothyroidism TSH is intermittently elevated, could be related to side effects of checkpoint inhibitors The dose of her Synthroid was recently modified We will continue to monitor that carefully  Essential hypertension Her documented blood pressure here is high even though according to the patient, at home, her blood pressure is normal in the 120s She has history of mild proteinuria She will continue current blood pressure management  Orders Placed This Encounter  Procedures   CT CHEST ABDOMEN PELVIS W CONTRAST    Standing Status:   Future    Standing Expiration Date:   03/05/2023    Order Specific Question:   Preferred imaging location?    Answer:   [facility]    Order Specific Question:   Radiology Contrast Protocol - do NOT remove file path    Answer:   [email or web address]    All questions were answered. The patient knows to call the clinic with any problems, questions or concerns. The total time spent in the appointment was 20 minutes encounter with patients including review of chart and various tests results, discussions about plan of care and coordination of care plan   [name], MD 03/04/2022 9:31 AM  INTERVAL HISTORY: Please see below for problem oriented charting. she returns for treatment follow-up on combination of pembrolizumab and Lenvima She has no side effects from treatment I  documented blood pressure from home in general were within normal limits  REVIEW OF SYSTEMS:   Constitutional: Denies fevers, chills or abnormal weight loss Eyes: Denies blurriness of vision Ears, nose, mouth, throat, and face: Denies mucositis or sore throat Respiratory: Denies cough, dyspnea or wheezes Cardiovascular: Denies palpitation, chest discomfort or lower extremity swelling Gastrointestinal:  Denies nausea, heartburn or change in bowel habits Skin: Denies abnormal skin rashes Lymphatics: Denies new lymphadenopathy or easy bruising Neurological:Denies numbness, tingling or new weaknesses Behavioral/Psych: Mood is stable, no new changes  All other systems were reviewed with the patient and are negative.  I have reviewed the past medical history, past surgical history, social history and family history with the patient and they are unchanged from previous note.  ALLERGIES:  is allergic to sulfa antibiotics and sulfamethoxazole.  MEDICATIONS:  Current Outpatient Medications  Medication Sig Dispense Refill   amLODipine-valsartan (EXFORGE) 5-160 MG tablet Take 1 tablet by mouth daily.     atenolol (TENORMIN) 25 MG tablet Take 1 tablet (25 mg total) by mouth daily. 90 tablet 1   Azelastine HCl 137 MCG/SPRAY SOLN Place into both nostrils.     Calcium Carb-Cholecalciferol 600-800 MG-UNIT TABS Take 1 tablet by mouth daily. (Patient not taking: Reported on 12/09/2021)     calcium carbonate (TUMS - DOSED IN MG ELEMENTAL CALCIUM) 500 MG chewable tablet Chew 2 tablets by mouth 2 (two) times daily.     cholecalciferol (VITAMIN D3) 25 MCG (1000 UNIT) tablet Take 5 tablets (5,000 Units total) by mouth daily. 30 tablet 2   ergocalciferol (VITAMIN D2) 1.25 MG (50000 UT) capsule Take 1 capsule (50,000 Units total) by  mouth once a week. 25 capsule 1   glucose blood (ONETOUCH VERIO) test strip Use as instructed to check blood sugars 3 times a day. 100 each 3   lenvatinib 10 mg daily dose (LENVIMA,  10 MG DAILY DOSE,) capsule Take 1 capsule (10 mg total) by mouth daily. 30 capsule 11   levothyroxine (EUTHYROX) 200 MCG tablet Take 1 tablet (200 mcg total) by mouth daily before breakfast. 90 tablet 3   levothyroxine (EUTHYROX) 50 MCG tablet Take 1 tablet (50 mcg total) by mouth daily before breakfast. 90 tablet 3   lidocaine-prilocaine (EMLA) cream Apply topically daily as needed. 30 g 3   OneTouch Delica Lancets 57Q MISC 1 each by Other route 2 (two) times daily. Use to monitor glucose levels BID; E11.65 100 each 2   RELION PEN NEEDLES 32G X 4 MM MISC USE 1 PEN PER DAY 50 each 5   simvastatin (ZOCOR) 10 MG tablet Take 10 mg by mouth at bedtime.      No current facility-administered medications for this visit.   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 Leo Grosser who obtained an endometrial biopsy showing poorly differentiated endometrial carcinoma and negative endocervical curettage   12/12/2017 Imaging   MAMMOGRAM FINDINGS: In the right axilla, a possible mass warrants further evaluation. In the left breast, no findings suspicious for malignancy.   Images were processed with CAD.   IMPRESSION: Further evaluation is suggested for possible mass in the right axilla.     12/24/2017  Imaging   Ct scan chest, abdomen and pelvis 1. Marked thickening of the endometrium (42 mm) compatible with known primary endometrial malignancy. No evidence of extrauterine invasion. 2. No pelvic or retroperitoneal adenopathy. 3. Right middle lobe 4 mm solid pulmonary nodule, for which follow-up chest CT is advised in 3-6 months. 4. Several findings that are equivocal for distant metastatic disease. Vaguely nodular heterogeneous hyperenhancement in the peripheral right liver lobe, which could represent benign transient perfusional phenomena, with underlying liver lesions not entirely excluded. Mildly sclerotic T12 vertebral lesion. Mildly enlarged right axillary lymph node. The best single test to further evaluate these findings would be a PET-CT. Alternative tests include bone scan or thoracic MRI without and with IV contrast for the T12 osseous lesion, MRI abdomen without and with IV contrast for the liver findings, and diagnostic mammographic evaluation for the right axillary node. 5.  Aortic Atherosclerosis (ICD10-I70.0).     01/09/2018 PET scan   1. Moderate hypermetabolism corresponding to enlarging axillary node since 12/22/2017. Highly suspicious for an atypical distribution of metastatic disease. 2. No hypermetabolism to suggest hepatic or T12 osseous metastasis. 3. Hypermetabolic endometrial primary.   01/23/2018 Initial Diagnosis   Endometrial cancer ([place])  01/23/2018 Pathology Results   1. Lymph node, sentinel, biopsy, left external iliac - NO CARCINOMA IDENTIFIED IN ONE LYMPH NODE (0/1) - SEE COMMENT 2. Lymph nodes, regional resection, right para aortic - NO CARCINOMA IDENTIFIED IN FOUR LYMPH NODES (0/4) - SEE COMMENT 3. Lymph nodes, regional resection, right pelvic - NO CARCINOMA IDENTIFIED IN EIGHT LYMPH NODES (0/8) - SEE COMMENT 4. Cul-de-sac biopsy - METASTATIC CARCINOMA 5. Uterus +/- tubes/ovaries, neoplastic, cervix, bilateral fallopian tubes and ovaries UTERUS: -  MIXED SEROUS AND ENDOMETRIOID CARCINOMA - SEROSAL IMPLANTS PRESENT - LYMPHOVASCULAR SPACE INVASION PRESENT - LEIOMYOMATA (1.5 CM; LARGEST) - SEE ONCOLOGY TABLE AND COMMENT BELOW CERVIX: - BENIGN NABOTHIAN CYSTS - NO CARCINOMA IDENTIFIED BILATERAL OVARIES: - METASTATIC CARCINOMA PRESENT ON OVARIAN SURFACE BILATERAL FALLOPIAN TUBES: - INTRALUMINAL CARCINOMA Microscopic Comment 1. -3. Cytokeratin AE1/3 was performed on the sentinel lymph nodes to exclude micrometastasis. There is no evidence of metastatic carcinoma by immunohistochemistry. 5. 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%) Histologic Grade: N/A Myometrial invasion: Estimated less than 50% myometrial invasion (0.3 cm of myometrium involved; 1.4 cm measured thickness) Uterine Serosa Involvement: Present Cervical stromal involvement: Not identified Extent of involvement of other organs: - Fallopian tube (left within the lumen) - Ovary, left (surface involvement) - Cul-de-sac Lymphovascular invasion: Present Regional Lymph Nodes: Examined: 1 Sentinel 12 Non-sentinel 13 Total Lymph nodes with metastasis: 0 Isolated tumor cells (< 0.2 mm): 0 Micrometastasis: (> 0.2 mm and < 2.0 mm): 0 Macrometastasis: (> 2.0 mm): 0 Extracapsular extension: N/A Tumor block for ancillary studies: 5E, 5B MMR / MSI testing: Pending will be reported separately Pathologic Stage Classification (pTNM, AJCC 8th edition): pT3a, pN0 FIGO Stage: IIIA COMMENT: There is tumor present on the surface of the left ovary and within the lumen of the left fallopian tube. The carcinoma appears to be mixed with the largest component being high grade serous carcinoma. Dr. Lyndon Code reviewed the case and agrees with the above diagnosis.    01/23/2018 Surgery   Surgeon: Donaciano Eva     Operation: Robotic-assisted laparoscopic total hysterectomy with bilateral salpingoophorectomy, SLN injection, mapping and biopsy, right pelvic and para-aortic lymphadenectomy   Operative Findings:  : 10-12cm bulky uterus with frank serosal involvement on posterior cul de sac peritoneum and anterior peritoneum which adhesed the bladder to the anterior uterus. Unilateral mapping on left pelvis. No grossly suspicious nodes. Normal omentum and diaphragms.    02/01/2018 Pathology Results   Lymph node, needle/core biopsy, right axilla - METASTATIC CARCINOMA - SEE COMMENT Microscopic Comment The neoplastic cells are positive for cytokeratin 7 and Pax-8 but negative for cytokeratin 5/6, cytokeratin 20, Gata-3, p63, p53 and GCDFP. Overall, the immunoprofile is consistent with metastasis from the patient's known gynecologic carcinoma.   02/01/2018 Procedure   Ultrasound-guided core biopsies of a suspicious right axillary lymph node.   02/21/2018 - 06/13/2018 Chemotherapy   The patient had carboplatin & Taxol x 6   03/26/2018 - 04/30/2018 Radiation Therapy   Radiation treatment dates:   03/26/18, 04/02/18, 04/19/18, 04/23/18 04/30/18   Site/dose: proximal Vagina, 6 Gy in 5 fractions for a total dose of 30 Gy     04/26/2018 PET scan   1. Interval resection of the hypermetabolic endometrial primary. No discernible hypermetabolic pelvic sidewall or peritoneal metastases. 2. Stable hypermetabolic bilateral axillary lymphadenopathy. The degree of hypermetabolism in these lymph nodes remains  highly suspicious for neoplasm. 3. Interval development of low level FDG uptake in 2 stable, small lymph nodes in the left groin region. This may be reactive, but close attention recommended to exclude metastatic involvement. 4. Stable non hypermetabolic low-density liver lesions and the mixed lucent and sclerotic T12 lesion is stable without hypermetabolism today.    07/09/2018 Imaging   Status post hysterectomy.   Mild axillary lymphadenopathy, improved. Nodal metastases not excluded.   Irregular wall thickening involving the anterior bladder, possibly reflecting radiation cystitis versus tumor.   Additional stable findings as above, including a 5 mm right middle lobe nodule and stable sclerosis involving the T12 vertebral body.   10/10/2018 Imaging   1. Stable exam. No findings within the abdomen or pelvis to suggest metastatic disease. 2. Unchanged appearance of sclerosis involving the T12 vertebra. 3.  Aortic Atherosclerosis (ICD10-I70.0).   10/10/2018 Imaging   Ct abdomen and pelvis 1. Stable exam. No findings within the abdomen or pelvis to suggest metastatic disease. 2. Unchanged appearance of sclerosis involving the T12 vertebra. 3.  Aortic Atherosclerosis (ICD10-I70.0).   01/16/2019 PET scan   1. Enlargement and increased activity in the left axillary, right axillary, and left inguinal adenopathy compatible with progressive malignancy. 2. Stable 4 mm right middle lobe subpleural nodule, not appreciably hypermetabolic. 3. Other imaging findings of potential clinical significance: Right kidney lower pole cyst. Aortic Atherosclerosis (ICD10-I70.0). Prominent stool throughout the colon favors constipation. Mild chronic left maxillary sinusitis.     02/01/2019 -  Chemotherapy   The patient had pembrolizumab and Lenvima for chemotherapy treatment.     04/25/2019 Imaging   Ct imaging 1. Interval response to therapy. Decrease in size of bilateral axillary and left inguinal lymph nodes. No new or progressive findings identified. 2. Stable sclerotic appearance of the T12 vertebra. 3. Aortic atherosclerosis. Lad coronary artery calcification noted.   08/30/2019 Imaging   1. Bulky RIGHT hilar lymph node, slightly increased in size from previous imaging. 2. Multiple foci of hyperenhancement in the liver. The study is closer to in arterial phase on  today's exam in these appear more numerous in the most recent prior, but more similar to the study of July 09, 2018 suspect that these represent flash fill hemangiomata but they are quite numerous. Consider MRI liver on follow-up to establish a baseline for number of lesions as these will appear variable on CT follow-up based on phase of contrast acquisition in the future. 3. Stable 5 mm nodule adjacent to the fissure, minor fissure in the RIGHT middle lobe.     11/28/2019 Imaging   1. Stable exam. No new or progressive findings. 2. Stable enlarged right axillary lymph node. 3. Stable tiny bilateral pulmonary nodules. Continued attention on follow-up recommended. 4. Scattered hypodensities in the liver parenchyma correspond to the hypervascular lesions seen on the previous study performed with intravenous contrast material. 5. Right renal cyst. 6. Aortic Atherosclerosis (ICD10-I70.0).     03/16/2020 Imaging   Increased echogenicity of renal parenchyma is noted bilaterally suggesting medical renal disease. No hydronephrosis or renal obstruction is noted   04/02/2020 Imaging   1. Right axillary lymphadenopathy is stable. Tiny bilateral pulmonary nodules are stable. Subcentimeter low-attenuation liver lesions are stable. 2. No new or progressive metastatic disease in the chest, abdomen or pelvis. 3. Aortic Atherosclerosis (ICD10-I70.0).   09/15/2020 Imaging   1. Stable RIGHT axillary lymph node with enlargement measuring 2 cm short axis. 2. Stable small nodule along the minor fissure in the RIGHT chest. 3. No  new or progressive findings. 4. Aortic atherosclerosis.   03/24/2021 Imaging   1. Right axillary lymphadenopathy is stable compared to prior studies. 2. No other definite signs of metastatic disease elsewhere in the thorax. 3. Hepatic steatosis. Numerous hypervascular areas noted in the liver, incompletely characterized on today's arterial phase examination. These are similar to remote  prior examination from 08/30/2019, likely to represent small benign lesions such as flash fill cavernous hemangiomas. These could be definitively evaluated with nonemergent abdominal MRI with and without IV gadolinium if of clinical concern. 4. Aortic atherosclerosis, in addition to left main and left anterior descending coronary artery disease. Assessment for potential risk factor modification, dietary therapy or pharmacologic therapy may be warranted, if clinically indicated.   Aortic Atherosclerosis (ICD10-I70.0).   09/16/2021 Imaging   1. Unchanged enlarged right axillary lymph node. No evidence of new lymphadenopathy or metastatic disease in the chest, abdomen, or pelvis. 2. Subcentimeter hyperenhancing lesions of the right lobe of the liver, hepatic segments VII and VIII are unchanged, measuring up to 0.7 cm. These are likely small benign incidental flash filling hemangiomata although incompletely characterized on this examination. Attention on follow-up. 3. Status post hysterectomy. 4. Cholelithiasis. 5. Coronary artery disease.   Aortic Atherosclerosis (ICD10-I70.0).     12/31/2021 -  Chemotherapy   Patient is on Treatment Plan : UTERINE Lenvatinib (20) D1-21 + Pembrolizumab (200) D1 q21d       PHYSICAL EXAMINATION: ECOG PERFORMANCE STATUS: 0 - Asymptomatic  Vitals:   03/04/22 0815  BP: (!) 158/90  Pulse: 81  Resp: 18  Temp: 97.8 F (36.6 C)  SpO2: 100%   Filed Weights   03/04/22 0815  Weight: 147 lb 9.6 oz (67 kg)    GENERAL:alert, no distress and comfortable NEURO: alert & oriented x 3 with fluent speech, no focal motor/sensory deficits  LABORATORY DATA:  I have reviewed the data as listed    Component Value Date/Time   NA 142 03/04/2022 0748   K 3.4 (L) 03/04/2022 0748   CL 111 03/04/2022 0748   CO2 24 03/04/2022 0748   GLUCOSE 122 (H) 03/04/2022 0748   BUN 19 03/04/2022 0748   CREATININE 0.99 03/04/2022 0748   CALCIUM 8.2 (L) 03/04/2022 0748   PROT 6.0 (L)  03/04/2022 0748   ALBUMIN 3.7 03/04/2022 0748   AST 15 03/04/2022 0748   ALT 14 03/04/2022 0748   ALKPHOS 61 03/04/2022 0748   BILITOT 0.5 03/04/2022 0748   GFRNONAA >60 03/04/2022 0748   GFRAA 34 (L) 01/10/2020 1200    No results found for: "SPEP", "UPEP"  Lab Results  Component Value Date   WBC 3.9 (L) 03/04/2022   NEUTROABS 2.4 03/04/2022   HGB 13.7 03/04/2022   HCT 43.3 03/04/2022   MCV 91.9 03/04/2022   PLT 176 03/04/2022      Chemistry      Component Value Date/Time   NA 142 03/04/2022 0748   K 3.4 (L) 03/04/2022 0748   CL 111 03/04/2022 0748   CO2 24 03/04/2022 0748   BUN 19 03/04/2022 0748   CREATININE 0.99 03/04/2022 0748      Component Value Date/Time   CALCIUM 8.2 (L) 03/04/2022 0748   ALKPHOS 61 03/04/2022 0748   AST 15 03/04/2022 0748   ALT 14 03/04/2022 0748   BILITOT 0.5 03/04/2022 0748

## 2022-03-04 NOTE — Assessment & Plan Note (Signed)
TSH is intermittently elevated, could be related to side effects of checkpoint inhibitors The dose of her Synthroid was recently modified We will continue to monitor that carefully

## 2022-03-23 ENCOUNTER — Ambulatory Visit (HOSPITAL_COMMUNITY)
Admission: RE | Admit: 2022-03-23 | Discharge: 2022-03-23 | Disposition: A | Discharge status: Home / Self Care | Payer: Medicare Other | Source: Ambulatory Visit | Attending: Hematology and Oncology | Admitting: Hematology and Oncology

## 2022-03-23 ENCOUNTER — Inpatient Hospital Stay: Payer: Medicare Other | Attending: Gynecology

## 2022-03-23 DIAGNOSIS — R1110 Vomiting, unspecified: Secondary | ICD-10-CM | POA: Diagnosis not present

## 2022-03-23 DIAGNOSIS — R600 Localized edema: Secondary | ICD-10-CM | POA: Insufficient documentation

## 2022-03-23 DIAGNOSIS — I129 Hypertensive chronic kidney disease with stage 1 through stage 4 chronic kidney disease, or unspecified chronic kidney disease: Secondary | ICD-10-CM | POA: Diagnosis not present

## 2022-03-23 DIAGNOSIS — C541 Malignant neoplasm of endometrium: Secondary | ICD-10-CM | POA: Diagnosis not present

## 2022-03-23 DIAGNOSIS — R634 Abnormal weight loss: Secondary | ICD-10-CM | POA: Diagnosis not present

## 2022-03-23 DIAGNOSIS — N183 Chronic kidney disease, stage 3 unspecified: Secondary | ICD-10-CM | POA: Diagnosis not present

## 2022-03-23 DIAGNOSIS — Z923 Personal history of irradiation: Secondary | ICD-10-CM | POA: Insufficient documentation

## 2022-03-23 DIAGNOSIS — Z5111 Encounter for antineoplastic chemotherapy: Secondary | ICD-10-CM | POA: Diagnosis not present

## 2022-03-23 DIAGNOSIS — M7989 Other specified soft tissue disorders: Secondary | ICD-10-CM | POA: Insufficient documentation

## 2022-03-23 DIAGNOSIS — Z800 Family history of malignant neoplasm of digestive organs: Secondary | ICD-10-CM | POA: Diagnosis not present

## 2022-03-23 DIAGNOSIS — Z7189 Other specified counseling: Secondary | ICD-10-CM

## 2022-03-23 DIAGNOSIS — I700 Atherosclerosis of aorta: Secondary | ICD-10-CM | POA: Diagnosis not present

## 2022-03-23 DIAGNOSIS — I771 Stricture of artery: Secondary | ICD-10-CM | POA: Diagnosis not present

## 2022-03-23 DIAGNOSIS — C773 Secondary and unspecified malignant neoplasm of axilla and upper limb lymph nodes: Secondary | ICD-10-CM | POA: Diagnosis not present

## 2022-03-23 DIAGNOSIS — I10 Essential (primary) hypertension: Secondary | ICD-10-CM

## 2022-03-23 DIAGNOSIS — D7389 Other diseases of spleen: Secondary | ICD-10-CM | POA: Diagnosis not present

## 2022-03-23 DIAGNOSIS — Z79899 Other long term (current) drug therapy: Secondary | ICD-10-CM | POA: Diagnosis not present

## 2022-03-23 DIAGNOSIS — E1122 Type 2 diabetes mellitus with diabetic chronic kidney disease: Secondary | ICD-10-CM | POA: Diagnosis not present

## 2022-03-23 LAB — CBC WITH DIFFERENTIAL (CANCER CENTER ONLY)
Abs Immature Granulocytes: 0.02 10*3/uL (ref 0.00–0.07)
Basophils Absolute: 0 10*3/uL (ref 0.0–0.1)
Basophils Relative: 0 %
Eosinophils Absolute: 0 10*3/uL (ref 0.0–0.5)
Eosinophils Relative: 1 %
HCT: 45.4 % (ref 36.0–46.0)
Hemoglobin: 14.7 g/dL (ref 12.0–15.0)
Immature Granulocytes: 0 %
Lymphocytes Relative: 26 %
Lymphs Abs: 1.4 10*3/uL (ref 0.7–4.0)
MCH: 29.3 pg (ref 26.0–34.0)
MCHC: 32.4 g/dL (ref 30.0–36.0)
MCV: 90.4 fL (ref 80.0–100.0)
Monocytes Absolute: 0.3 10*3/uL (ref 0.1–1.0)
Monocytes Relative: 5 %
Neutro Abs: 3.7 10*3/uL (ref 1.7–7.7)
Neutrophils Relative %: 68 %
Platelet Count: 186 10*3/uL (ref 150–400)
RBC: 5.02 MIL/uL (ref 3.87–5.11)
RDW: 14.6 % (ref 11.5–15.5)
WBC Count: 5.4 10*3/uL (ref 4.0–10.5)
nRBC: 0 % (ref 0.0–0.2)

## 2022-03-23 LAB — CMP (CANCER CENTER ONLY)
ALT: 15 U/L (ref 0–44)
AST: 16 U/L (ref 15–41)
Albumin: 3.8 g/dL (ref 3.5–5.0)
Alkaline Phosphatase: 73 U/L (ref 38–126)
Anion gap: 8 (ref 5–15)
BUN: 21 mg/dL (ref 8–23)
CO2: 24 mmol/L (ref 22–32)
Calcium: 8.8 mg/dL — ABNORMAL LOW (ref 8.9–10.3)
Chloride: 110 mmol/L (ref 98–111)
Creatinine: 1.12 mg/dL — ABNORMAL HIGH (ref 0.44–1.00)
GFR, Estimated: 53 mL/min — ABNORMAL LOW (ref >60)
Glucose, Bld: 84 mg/dL (ref 70–99)
Potassium: 3.1 mmol/L — ABNORMAL LOW (ref 3.5–5.1)
Sodium: 142 mmol/L (ref 135–145)
Total Bilirubin: 0.5 mg/dL (ref 0.3–1.2)
Total Protein: 6.3 g/dL — ABNORMAL LOW (ref 6.5–8.1)

## 2022-03-23 LAB — TOTAL PROTEIN, URINE DIPSTICK: Protein, ur: 100 mg/dL — AB

## 2022-03-23 MED ORDER — IOHEXOL 300 MG/ML  SOLN
100.0000 mL | Freq: Once | INTRAMUSCULAR | Status: AC | PRN
  Administered 2022-03-23: 75 mL via INTRAVENOUS

## 2022-03-23 MED ORDER — IOHEXOL 9 MG/ML PO SOLN
ORAL | Status: AC
  Filled 2022-03-23: qty 1000

## 2022-03-23 MED ORDER — HEPARIN SOD (PORK) LOCK FLUSH 100 UNIT/ML IV SOLN
INTRAVENOUS | Status: AC
  Administered 2022-03-23: 500 [IU]
  Filled 2022-03-23: qty 5

## 2022-03-23 MED ORDER — SODIUM CHLORIDE 0.9% FLUSH
10.0000 mL | Freq: Once | INTRAVENOUS | Status: AC
  Administered 2022-03-23: 10 mL

## 2022-03-23 MED ORDER — SODIUM CHLORIDE (PF) 0.9 % IJ SOLN
INTRAMUSCULAR | Status: AC
  Filled 2022-03-23: qty 50

## 2022-03-25 ENCOUNTER — Inpatient Hospital Stay (HOSPITAL_BASED_OUTPATIENT_CLINIC_OR_DEPARTMENT_OTHER): Payer: Medicare Other | Admitting: Hematology and Oncology

## 2022-03-25 ENCOUNTER — Other Ambulatory Visit: Payer: Self-pay

## 2022-03-25 ENCOUNTER — Encounter: Payer: Self-pay | Admitting: Hematology and Oncology

## 2022-03-25 ENCOUNTER — Encounter: Payer: Self-pay | Admitting: Oncology

## 2022-03-25 ENCOUNTER — Inpatient Hospital Stay: Payer: Medicare Other

## 2022-03-25 VITALS — BP 143/79 | HR 77 | Temp 97.9°F | Resp 16 | Ht 66.0 in | Wt 146.0 lb

## 2022-03-25 DIAGNOSIS — Z7189 Other specified counseling: Secondary | ICD-10-CM | POA: Diagnosis not present

## 2022-03-25 DIAGNOSIS — N183 Chronic kidney disease, stage 3 unspecified: Secondary | ICD-10-CM

## 2022-03-25 DIAGNOSIS — C541 Malignant neoplasm of endometrium: Secondary | ICD-10-CM | POA: Diagnosis not present

## 2022-03-25 DIAGNOSIS — I10 Essential (primary) hypertension: Secondary | ICD-10-CM

## 2022-03-25 DIAGNOSIS — M7989 Other specified soft tissue disorders: Secondary | ICD-10-CM | POA: Diagnosis not present

## 2022-03-25 DIAGNOSIS — C773 Secondary and unspecified malignant neoplasm of axilla and upper limb lymph nodes: Secondary | ICD-10-CM | POA: Diagnosis not present

## 2022-03-25 DIAGNOSIS — R600 Localized edema: Secondary | ICD-10-CM | POA: Diagnosis not present

## 2022-03-25 DIAGNOSIS — I129 Hypertensive chronic kidney disease with stage 1 through stage 4 chronic kidney disease, or unspecified chronic kidney disease: Secondary | ICD-10-CM | POA: Diagnosis not present

## 2022-03-25 NOTE — Assessment & Plan Note (Signed)
As above, we will consult general surgery for resection of oligometastatic disease

## 2022-03-25 NOTE — Progress Notes (Signed)
Referral faxed successfully to [facility] at [phone].

## 2022-03-25 NOTE — Assessment & Plan Note (Signed)
Renal function is stable She will continue risk factor modification

## 2022-03-25 NOTE — Assessment & Plan Note (Signed)
I have reviewed her CT imaging and discussed plan of care with GYN surgeon She has no evidence of disease progression over the past few years on pembrolizumab and Lenvima She has persistent lymphadenopathy in the right axilla that is unchanged and that is the only site of disease over the past 3-1/2 years I recommend discontinuation of chemotherapy and referral to general surgery for resection of oligometastatic disease She is in agreement I plan to see her end of January for further follow-up She will take her last dose of Lenvima today I recommend the patient to hold the plan for surgery until after the new year to allow recovery from side effects of treatment

## 2022-03-25 NOTE — Assessment & Plan Note (Signed)
Her blood pressure was caused by Lenvima With discontinuation of treatment, I anticipate that her blood pressure will go down She will continue blood pressure management and follow-up with her primary care doctor

## 2022-03-25 NOTE — Progress Notes (Signed)
[place] OFFICE PROGRESS NOTE  Patient Care Team: Jonathon Jordan, MD as PCP - General (Family Medicine)  ASSESSMENT & PLAN:  Endometrial cancer [facility]) I have reviewed her CT imaging and discussed plan of care with GYN surgeon She has no evidence of disease progression over the past few years on pembrolizumab and Lenvima She has persistent lymphadenopathy in the right axilla that is unchanged and that is the only site of disease over the past 3-1/2 years I recommend discontinuation of chemotherapy and referral to general surgery for resection of oligometastatic disease She is in agreement I plan to see her end of January for further follow-up She will take her last dose of Lenvima today I recommend the patient to hold the plan for surgery until after the new year to allow recovery from side effects of treatment  Metastasis to lymph nodes ([place]) As above, we will consult general surgery for resection of oligometastatic disease  Essential hypertension Her blood pressure was caused by Lenvima With discontinuation of treatment, I anticipate that her blood pressure will go down She will continue blood pressure management and follow-up with her primary care doctor  Chronic kidney disease (CKD), stage III (moderate) ([place]) Renal function is stable She will continue risk factor modification  Orders Placed This Encounter  Procedures   Ambulatory referral to General Surgery    Referral Priority:   Routine    Referral Type:   Surgical    Referral Reason:   Specialty Services Required    Referred to Provider:   Coralie Keens, MD    Requested Specialty:   General Surgery    Number of Visits Requested:   1    All questions were answered. The patient knows to call the clinic with any problems, questions or concerns. The total time spent in the appointment was 40 minutes encounter with patients including review of chart and various tests results, discussions about plan of  care and coordination of care plan   [name], MD 03/25/2022 9:20 AM  INTERVAL HISTORY: Please see below for problem oriented charting. she returns for treatment follow-up and review test results She tolerated recent treatment well Her blood pressure is well-controlled at home We spent majority of our time reviewing test results and discussed the next step  REVIEW OF SYSTEMS:   Constitutional: Denies fevers, chills or abnormal weight loss Eyes: Denies blurriness of vision Ears, nose, mouth, throat, and face: Denies mucositis or sore throat Respiratory: Denies cough, dyspnea or wheezes Cardiovascular: Denies palpitation, chest discomfort or lower extremity swelling Gastrointestinal:  Denies nausea, heartburn or change in bowel habits Skin: Denies abnormal skin rashes Lymphatics: Denies new lymphadenopathy or easy bruising Neurological:Denies numbness, tingling or new weaknesses Behavioral/Psych: Mood is stable, no new changes  All other systems were reviewed with the patient and are negative.  I have reviewed the past medical history, past surgical history, social history and family history with the patient and they are unchanged from previous note.  ALLERGIES:  is allergic to sulfa antibiotics and sulfamethoxazole.  MEDICATIONS:  Current Outpatient Medications  Medication Sig Dispense Refill   amLODipine-valsartan (EXFORGE) 5-160 MG tablet Take 1 tablet by mouth daily.     atenolol (TENORMIN) 25 MG tablet Take 1 tablet (25 mg total) by mouth daily. 90 tablet 1   Azelastine HCl 137 MCG/SPRAY SOLN Place into both nostrils.     Calcium Carb-Cholecalciferol 600-800 MG-UNIT TABS Take 1 tablet by mouth daily. (Patient not taking: Reported on 12/09/2021)  calcium carbonate (TUMS - DOSED IN MG ELEMENTAL CALCIUM) 500 MG chewable tablet Chew 2 tablets by mouth 2 (two) times daily.     cholecalciferol (VITAMIN D3) 25 MCG (1000 UNIT) tablet Take 5 tablets (5,000 Units total) by mouth  daily. 30 tablet 2   ergocalciferol (VITAMIN D2) 1.25 MG (50000 UT) capsule Take 1 capsule (50,000 Units total) by mouth once a week. 25 capsule 1   glucose blood (ONETOUCH VERIO) test strip Use as instructed to check blood sugars 3 times a day. 100 each 3   lenvatinib 10 mg daily dose (LENVIMA, 10 MG DAILY DOSE,) capsule Take 1 capsule (10 mg total) by mouth daily. 30 capsule 11   levothyroxine (EUTHYROX) 200 MCG tablet Take 1 tablet (200 mcg total) by mouth daily before breakfast. 90 tablet 3   levothyroxine (EUTHYROX) 50 MCG tablet Take 1 tablet (50 mcg total) by mouth daily before breakfast. 90 tablet 3   lidocaine-prilocaine (EMLA) cream Apply topically daily as needed. 30 g 3   OneTouch Delica Lancets 33G MISC 1 each by Other route 2 (two) times daily. Use to monitor glucose levels BID; E11.65 100 each 2   RELION PEN NEEDLES 32G X 4 MM MISC USE 1 PEN PER DAY 50 each 5   simvastatin (ZOCOR) 10 MG tablet Take 10 mg by mouth at bedtime.      No current facility-administered medications for this visit.    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 Haygood who obtained an endometrial biopsy showing poorly differentiated endometrial carcinoma and negative endocervical curettage   12/12/2017 Imaging   MAMMOGRAM FINDINGS: In the right axilla, a possible mass warrants further evaluation. In the left breast, no findings suspicious for malignancy.   Images were processed with CAD.   IMPRESSION: Further evaluation is suggested for possible mass in the right axilla.     12/24/2017 Imaging   Ct scan chest, abdomen and pelvis 1. Marked thickening of the endometrium (42 mm) compatible with known primary endometrial  malignancy. No evidence of extrauterine invasion. 2. No pelvic or retroperitoneal adenopathy. 3. Right middle lobe 4 mm solid pulmonary nodule, for which follow-up chest CT is advised in 3-6 months. 4. Several findings that are equivocal for distant metastatic disease. Vaguely nodular heterogeneous hyperenhancement in the peripheral right liver lobe, which could represent benign transient perfusional phenomena, with underlying liver lesions not entirely excluded. Mildly sclerotic T12 vertebral lesion. Mildly enlarged right axillary lymph node. The best single test to further evaluate these findings would be a PET-CT. Alternative tests include bone scan or thoracic MRI without and with IV contrast for the T12 osseous lesion, MRI abdomen without and with IV contrast for the liver findings, and diagnostic mammographic evaluation for the right axillary node. 5.  Aortic Atherosclerosis (ICD10-I70.0).     01/09/2018 PET scan   1. Moderate hypermetabolism corresponding to enlarging axillary node since 12/22/2017. Highly suspicious for an atypical distribution of metastatic disease. 2. No hypermetabolism to suggest hepatic or T12 osseous metastasis. 3. Hypermetabolic endometrial primary.   01/23/2018 Initial Diagnosis   Endometrial cancer (HCC)   01/23/2018 Pathology Results   1. Lymph node, sentinel, biopsy, left external iliac - NO CARCINOMA IDENTIFIED IN ONE LYMPH NODE (0/1) - SEE COMMENT   2. Lymph nodes, regional resection, right para aortic - NO CARCINOMA IDENTIFIED IN FOUR LYMPH NODES (0/4) - SEE COMMENT 3. Lymph nodes, regional resection, right pelvic - NO CARCINOMA IDENTIFIED IN EIGHT LYMPH NODES (0/8) - SEE COMMENT 4. Cul-de-sac biopsy - METASTATIC CARCINOMA 5. Uterus +/- tubes/ovaries, neoplastic, cervix, bilateral fallopian tubes and ovaries UTERUS: - MIXED SEROUS AND ENDOMETRIOID CARCINOMA - SEROSAL IMPLANTS PRESENT - LYMPHOVASCULAR SPACE INVASION PRESENT - LEIOMYOMATA (1.5 CM;  LARGEST) - SEE ONCOLOGY TABLE AND COMMENT BELOW CERVIX: - BENIGN NABOTHIAN CYSTS - NO CARCINOMA IDENTIFIED BILATERAL OVARIES: - METASTATIC CARCINOMA PRESENT ON OVARIAN SURFACE BILATERAL FALLOPIAN TUBES: - INTRALUMINAL CARCINOMA Microscopic Comment 1. -3. Cytokeratin AE1/3 was performed on the sentinel lymph nodes to exclude micrometastasis. There is no evidence of metastatic carcinoma by immunohistochemistry. 5. 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%) Histologic Grade: N/A Myometrial invasion: Estimated less than 50% myometrial invasion (0.3 cm of myometrium involved; 1.4 cm measured thickness) Uterine Serosa Involvement: Present Cervical stromal involvement: Not identified Extent of involvement of other organs: - Fallopian tube (left within the lumen) - Ovary, left (surface involvement) - Cul-de-sac Lymphovascular invasion: Present Regional Lymph Nodes: Examined: 1 Sentinel 12 Non-sentinel 13 Total Lymph nodes with metastasis: 0 Isolated tumor cells (< 0.2 mm): 0 Micrometastasis: (> 0.2 mm and < 2.0 mm): 0 Macrometastasis: (> 2.0 mm): 0 Extracapsular extension: N/A Tumor block for ancillary studies: 5E, 5B MMR / MSI testing: Pending will be reported separately Pathologic Stage Classification (pTNM, AJCC 8th edition): pT3a, pN0 FIGO Stage: IIIA COMMENT: There is tumor present on the surface of the left ovary and within the lumen of the left fallopian tube. The carcinoma appears to be mixed with the largest component being high grade serous carcinoma. Dr. Lyndon Code reviewed the case and agrees with the above diagnosis.   01/23/2018 Surgery   Surgeon: Donaciano Eva     Operation: Robotic-assisted laparoscopic total hysterectomy with bilateral  salpingoophorectomy, SLN injection, mapping and biopsy, right pelvic and para-aortic lymphadenectomy   Operative Findings:  : 10-12cm bulky uterus with frank serosal involvement on posterior cul de sac peritoneum and anterior peritoneum which adhesed the bladder to the anterior uterus. Unilateral mapping on left pelvis. No grossly suspicious nodes. Normal omentum and diaphragms.    02/01/2018 Pathology Results   Lymph node, needle/core biopsy, right axilla - METASTATIC CARCINOMA - SEE COMMENT Microscopic Comment The neoplastic cells are positive for cytokeratin 7 and Pax-8 but negative for cytokeratin 5/6, cytokeratin 20, Gata-3, p63, p53 and GCDFP. Overall, the immunoprofile is consistent with metastasis from the patient's known gynecologic carcinoma.   02/01/2018 Procedure   Ultrasound-guided core biopsies of a suspicious right axillary lymph node.   02/21/2018 - 06/13/2018 Chemotherapy   The patient had carboplatin & Taxol x 6   03/26/2018 - 04/30/2018 Radiation Therapy   Radiation treatment dates:   03/26/18, 04/02/18, 04/19/18, 04/23/18 04/30/18   Site/dose: proximal Vagina, 6 Gy in 5 fractions for a total dose of 30 Gy     04/26/2018 PET scan   1. Interval resection of the hypermetabolic endometrial primary. No discernible hypermetabolic pelvic sidewall or peritoneal metastases. 2. Stable hypermetabolic bilateral axillary lymphadenopathy. The degree of hypermetabolism in these lymph nodes remains highly suspicious for neoplasm. 3. Interval development of low level FDG uptake in 2 stable, small lymph nodes in the left groin region. This may  be reactive, but close attention recommended to exclude metastatic involvement. 4. Stable non hypermetabolic low-density liver lesions and the mixed lucent and sclerotic T12 lesion is stable without hypermetabolism today.   07/09/2018 Imaging   Status post hysterectomy.   Mild axillary lymphadenopathy, improved. Nodal metastases not excluded.    Irregular wall thickening involving the anterior bladder, possibly reflecting radiation cystitis versus tumor.   Additional stable findings as above, including a 5 mm right middle lobe nodule and stable sclerosis involving the T12 vertebral body.   10/10/2018 Imaging   1. Stable exam. No findings within the abdomen or pelvis to suggest metastatic disease. 2. Unchanged appearance of sclerosis involving the T12 vertebra. 3.  Aortic Atherosclerosis (ICD10-I70.0).   10/10/2018 Imaging   Ct abdomen and pelvis 1. Stable exam. No findings within the abdomen or pelvis to suggest metastatic disease. 2. Unchanged appearance of sclerosis involving the T12 vertebra. 3.  Aortic Atherosclerosis (ICD10-I70.0).   01/16/2019 PET scan   1. Enlargement and increased activity in the left axillary, right axillary, and left inguinal adenopathy compatible with progressive malignancy. 2. Stable 4 mm right middle lobe subpleural nodule, not appreciably hypermetabolic. 3. Other imaging findings of potential clinical significance: Right kidney lower pole cyst. Aortic Atherosclerosis (ICD10-I70.0). Prominent stool throughout the colon favors constipation. Mild chronic left maxillary sinusitis.     02/01/2019 -  Chemotherapy   The patient had pembrolizumab and Lenvima for chemotherapy treatment.     04/25/2019 Imaging   Ct imaging 1. Interval response to therapy. Decrease in size of bilateral axillary and left inguinal lymph nodes. No new or progressive findings identified. 2. Stable sclerotic appearance of the T12 vertebra. 3. Aortic atherosclerosis. Lad coronary artery calcification noted.   08/30/2019 Imaging   1. Bulky RIGHT hilar lymph node, slightly increased in size from previous imaging. 2. Multiple foci of hyperenhancement in the liver. The study is closer to in arterial phase on today's exam in these appear more numerous in the most recent prior, but more similar to the study of July 09, 2018 suspect that  these represent flash fill hemangiomata but they are quite numerous. Consider MRI liver on follow-up to establish a baseline for number of lesions as these will appear variable on CT follow-up based on phase of contrast acquisition in the future. 3. Stable 5 mm nodule adjacent to the fissure, minor fissure in the RIGHT middle lobe.     11/28/2019 Imaging   1. Stable exam. No new or progressive findings. 2. Stable enlarged right axillary lymph node. 3. Stable tiny bilateral pulmonary nodules. Continued attention on follow-up recommended. 4. Scattered hypodensities in the liver parenchyma correspond to the hypervascular lesions seen on the previous study performed with intravenous contrast material. 5. Right renal cyst. 6. Aortic Atherosclerosis (ICD10-I70.0).     03/16/2020 Imaging   Increased echogenicity of renal parenchyma is noted bilaterally suggesting medical renal disease. No hydronephrosis or renal obstruction is noted   04/02/2020 Imaging   1. Right axillary lymphadenopathy is stable. Tiny bilateral pulmonary nodules are stable. Subcentimeter low-attenuation liver lesions are stable. 2. No new or progressive metastatic disease in the chest, abdomen or pelvis. 3. Aortic Atherosclerosis (ICD10-I70.0).   09/15/2020 Imaging   1. Stable RIGHT axillary lymph node with enlargement measuring 2 cm short axis. 2. Stable small nodule along the minor fissure in the RIGHT chest. 3. No new or progressive findings. 4. Aortic atherosclerosis.   03/24/2021 Imaging   1. Right axillary lymphadenopathy is stable compared to prior studies. 2. No  other definite signs of metastatic disease elsewhere in the thorax. 3. Hepatic steatosis. Numerous hypervascular areas noted in the liver, incompletely characterized on today's arterial phase examination. These are similar to remote prior examination from 08/30/2019, likely to represent small benign lesions such as flash fill cavernous hemangiomas. These could  be definitively evaluated with nonemergent abdominal MRI with and without IV gadolinium if of clinical concern. 4. Aortic atherosclerosis, in addition to left main and left anterior descending coronary artery disease. Assessment for potential risk factor modification, dietary therapy or pharmacologic therapy may be warranted, if clinically indicated.   Aortic Atherosclerosis (ICD10-I70.0).   09/16/2021 Imaging   1. Unchanged enlarged right axillary lymph node. No evidence of new lymphadenopathy or metastatic disease in the chest, abdomen, or pelvis. 2. Subcentimeter hyperenhancing lesions of the right lobe of the liver, hepatic segments VII and VIII are unchanged, measuring up to 0.7 cm. These are likely small benign incidental flash filling hemangiomata although incompletely characterized on this examination. Attention on follow-up. 3. Status post hysterectomy. 4. Cholelithiasis. 5. Coronary artery disease.   Aortic Atherosclerosis (ICD10-I70.0).     12/31/2021 - 03/04/2022 Chemotherapy   Patient is on Treatment Plan : UTERINE Lenvatinib (20) D1-21 + Pembrolizumab (200) D1 q21d     03/24/2022 Imaging   1. Similar to mild enlargement of an isolated right axillary nodal mass. 2. No new sites of disease identified. 3.  Possible constipation. 4. Coronary artery atherosclerosis. Aortic Atherosclerosis (ICD10-I70.0).     PHYSICAL EXAMINATION: ECOG PERFORMANCE STATUS: 1 - Symptomatic but completely ambulatory  Vitals:   03/25/22 0821  BP: (!) 143/79  Pulse: 77  Resp: 16  Temp: 97.9 F (36.6 C)  SpO2: 99%   Filed Weights   03/25/22 0821  Weight: 146 lb (66.2 kg)    GENERAL:alert, no distress and comfortable NEURO: alert & oriented x 3 with fluent speech, no focal motor/sensory deficits  LABORATORY DATA:  I have reviewed the data as listed    Component Value Date/Time   NA 142 03/23/2022 0824   K 3.1 (L) 03/23/2022 0824   CL 110 03/23/2022 0824   CO2 24 03/23/2022 0824    GLUCOSE 84 03/23/2022 0824   BUN 21 03/23/2022 0824   CREATININE 1.12 (H) 03/23/2022 0824   CALCIUM 8.8 (L) 03/23/2022 0824   PROT 6.3 (L) 03/23/2022 0824   ALBUMIN 3.8 03/23/2022 0824   AST 16 03/23/2022 0824   ALT 15 03/23/2022 0824   ALKPHOS 73 03/23/2022 0824   BILITOT 0.5 03/23/2022 0824   GFRNONAA 53 (L) 03/23/2022 0824   GFRAA 34 (L) 01/10/2020 1200    No results found for: "SPEP", "UPEP"  Lab Results  Component Value Date   WBC 5.4 03/23/2022   NEUTROABS 3.7 03/23/2022   HGB 14.7 03/23/2022   HCT 45.4 03/23/2022   MCV 90.4 03/23/2022   PLT 186 03/23/2022      Chemistry      Component Value Date/Time   NA 142 03/23/2022 0824   K 3.1 (L) 03/23/2022 0824   CL 110 03/23/2022 0824   CO2 24 03/23/2022 0824   BUN 21 03/23/2022 0824   CREATININE 1.12 (H) 03/23/2022 0824      Component Value Date/Time   CALCIUM 8.8 (L) 03/23/2022 0824   ALKPHOS 73 03/23/2022 0824   AST 16 03/23/2022 0824   ALT 15 03/23/2022 0824   BILITOT 0.5 03/23/2022 0824       RADIOGRAPHIC STUDIES: I have reviewed multiple CT imaging with the patient I  have personally reviewed the radiological images as listed and agreed with the findings in the report. CT CHEST ABDOMEN PELVIS W CONTRAST  Result Date: 03/23/2022 CLINICAL DATA:  Cervical/endometrial cancer. Evaluate treatment response. Diagnosed in 2019. Chemotherapy and radiation therapy complete. Immunotherapy and oral chemotherapy in progress. Weight loss and vomiting. * Tracking Code: BO * EXAM: CT CHEST, ABDOMEN, AND PELVIS WITH CONTRAST TECHNIQUE: Multidetector CT imaging of the chest, abdomen and pelvis was performed following the standard protocol during bolus administration of intravenous contrast. RADIATION DOSE REDUCTION: This exam was performed according to the departmental dose-optimization program which includes automated exposure control, adjustment of the mA and/or kV according to patient size and/or use of iterative reconstruction  technique. CONTRAST:  68m OMNIPAQUE IOHEXOL 300 MG/ML  SOLN COMPARISON:  09/15/2021 FINDINGS: CT CHEST FINDINGS Cardiovascular: Right Port-A-Cath tip low SVC. Aortic atherosclerosis. Tortuous thoracic aorta. Borderline cardiomegaly. Lad coronary artery calcification. No central pulmonary embolism, on this non-dedicated study. Mediastinum/Nodes: No supraclavicular adenopathy. Right axillary heterogeneous, partially necrotic nodal mass measures 2.4 x 2.2 cm on 10/02 versus 2.2 x 2.1 cm when measured in a similar fashion on the prior exam. No mediastinal or hilar adenopathy. Lungs/Pleura: No pleural fluid. 3 mm nodule on the right minor fissure is similar on 68/4 and most likely a subpleural lymph node. Left upper lobe ground-glass nodule is similar at 5 mm on 50/4. Musculoskeletal: Left T12 sclerosis has been similar back to 04/25/2019 and per that report 12/22/2017. No hypermetabolism on prior PET, favoring a benign hemangioma. CT ABDOMEN PELVIS FINDINGS Hepatobiliary: Tiny hyperenhancing foci within the right liver lobe are similar, given differences in bolus timing, likely flash fill hemangiomas. Surrounding transient hepatic attenuation differences included on 49/2. No suspicious liver lesion. Normal gallbladder, without biliary ductal dilatation. Pancreas: Normal, without mass or ductal dilatation. Spleen: Hypoattenuating splenic lesions of up to 7 mm are similar given differences in bolus timing and of doubtful clinical significance. Adrenals/Urinary Tract: Normal adrenal glands. Normal left kidney. Inter/lower pole right renal 3.6 cm fluid density lesion is most consistent with a cyst . In the absence of clinically indicated signs/symptoms require(s) no independent follow-up. No hydronephrosis. Normal urinary bladder. Stomach/Bowel: Portions of the proximal stomach and gastric antrum appears thick walled but are underdistended. Large colonic stool burden, especially distally. Normal terminal ileum. Normal  small bowel. Vascular/Lymphatic: Aortic atherosclerosis. No abdominopelvic adenopathy. Reproductive: Hysterectomy.  No adnexal mass. Other: No significant free fluid. Mild pelvic floor laxity. No evidence of omental or peritoneal disease. No free intraperitoneal air. Musculoskeletal: Mild osteopenia. Trace L4-5 anterolisthesis. Disc bulges at this level and at the lumbosacral junction. More mild upper lumbar disc bulges. IMPRESSION: 1. Similar to mild enlargement of an isolated right axillary nodal mass. 2. No new sites of disease identified. 3.  Possible constipation. 4. Coronary artery atherosclerosis. Aortic Atherosclerosis (ICD10-I70.0). Electronically Signed   By: KAbigail MiyamotoM.D.   On: 03/23/2022 16:40

## 2022-03-28 ENCOUNTER — Telehealth: Payer: Self-pay | Admitting: Oncology

## 2022-03-28 NOTE — Telephone Encounter (Signed)
[facility] regarding referral to Dr. Ninfa Linden.  They have the referral and will work on scheduling it today.

## 2022-03-31 DIAGNOSIS — R591 Generalized enlarged lymph nodes: Secondary | ICD-10-CM | POA: Diagnosis not present

## 2022-04-05 ENCOUNTER — Other Ambulatory Visit: Payer: Self-pay

## 2022-04-05 ENCOUNTER — Telehealth: Payer: Self-pay

## 2022-04-05 ENCOUNTER — Other Ambulatory Visit: Payer: Self-pay | Admitting: Physician Assistant

## 2022-04-05 ENCOUNTER — Inpatient Hospital Stay (HOSPITAL_BASED_OUTPATIENT_CLINIC_OR_DEPARTMENT_OTHER): Payer: Medicare Other | Admitting: Physician Assistant

## 2022-04-05 VITALS — BP 151/79 | HR 76 | Temp 97.8°F | Resp 18 | Wt 161.7 lb

## 2022-04-05 DIAGNOSIS — C541 Malignant neoplasm of endometrium: Secondary | ICD-10-CM | POA: Diagnosis not present

## 2022-04-05 DIAGNOSIS — M7989 Other specified soft tissue disorders: Secondary | ICD-10-CM | POA: Diagnosis not present

## 2022-04-05 DIAGNOSIS — R600 Localized edema: Secondary | ICD-10-CM | POA: Diagnosis not present

## 2022-04-05 DIAGNOSIS — I129 Hypertensive chronic kidney disease with stage 1 through stage 4 chronic kidney disease, or unspecified chronic kidney disease: Secondary | ICD-10-CM | POA: Diagnosis not present

## 2022-04-05 DIAGNOSIS — C773 Secondary and unspecified malignant neoplasm of axilla and upper limb lymph nodes: Secondary | ICD-10-CM | POA: Diagnosis not present

## 2022-04-05 DIAGNOSIS — N183 Chronic kidney disease, stage 3 unspecified: Secondary | ICD-10-CM | POA: Diagnosis not present

## 2022-04-05 LAB — CBC WITH DIFFERENTIAL (CANCER CENTER ONLY)
Abs Immature Granulocytes: 0.01 10*3/uL (ref 0.00–0.07)
Basophils Absolute: 0 10*3/uL (ref 0.0–0.1)
Basophils Relative: 0 %
Eosinophils Absolute: 0 10*3/uL (ref 0.0–0.5)
Eosinophils Relative: 1 %
HCT: 38.7 % (ref 36.0–46.0)
Hemoglobin: 12.4 g/dL (ref 12.0–15.0)
Immature Granulocytes: 0 %
Lymphocytes Relative: 27 %
Lymphs Abs: 1.3 10*3/uL (ref 0.7–4.0)
MCH: 29.2 pg (ref 26.0–34.0)
MCHC: 32 g/dL (ref 30.0–36.0)
MCV: 91.3 fL (ref 80.0–100.0)
Monocytes Absolute: 0.4 10*3/uL (ref 0.1–1.0)
Monocytes Relative: 7 %
Neutro Abs: 3 10*3/uL (ref 1.7–7.7)
Neutrophils Relative %: 65 %
Platelet Count: 163 10*3/uL (ref 150–400)
RBC: 4.24 MIL/uL (ref 3.87–5.11)
RDW: 15.1 % (ref 11.5–15.5)
WBC Count: 4.7 10*3/uL (ref 4.0–10.5)
nRBC: 0 % (ref 0.0–0.2)

## 2022-04-05 LAB — CMP (CANCER CENTER ONLY)
ALT: 26 U/L (ref 0–44)
AST: 23 U/L (ref 15–41)
Albumin: 3.2 g/dL — ABNORMAL LOW (ref 3.5–5.0)
Alkaline Phosphatase: 75 U/L (ref 38–126)
Anion gap: 6 (ref 5–15)
BUN: 23 mg/dL (ref 8–23)
CO2: 24 mmol/L (ref 22–32)
Calcium: 9.2 mg/dL (ref 8.9–10.3)
Chloride: 114 mmol/L — ABNORMAL HIGH (ref 98–111)
Creatinine: 1.05 mg/dL — ABNORMAL HIGH (ref 0.44–1.00)
GFR, Estimated: 57 mL/min — ABNORMAL LOW (ref >60)
Glucose, Bld: 173 mg/dL — ABNORMAL HIGH (ref 70–99)
Potassium: 3.7 mmol/L (ref 3.5–5.1)
Sodium: 144 mmol/L (ref 135–145)
Total Bilirubin: 0.5 mg/dL (ref 0.3–1.2)
Total Protein: 5.8 g/dL — ABNORMAL LOW (ref 6.5–8.1)

## 2022-04-05 LAB — BRAIN NATRIURETIC PEPTIDE: B Natriuretic Peptide: 175.9 pg/mL — ABNORMAL HIGH (ref 0.0–100.0)

## 2022-04-05 MED ORDER — FUROSEMIDE 20 MG PO TABS: 20.0000 mg | ORAL_TABLET | Freq: Every day | ORAL | 0 refills | Status: DC

## 2022-04-05 MED ORDER — SODIUM CHLORIDE 0.9% FLUSH
10.0000 mL | Freq: Once | INTRAVENOUS | Status: AC
  Administered 2022-04-05: 10 mL

## 2022-04-05 MED ORDER — HEPARIN SOD (PORK) LOCK FLUSH 100 UNIT/ML IV SOLN
500.0000 [IU] | Freq: Once | INTRAVENOUS | Status: AC
  Administered 2022-04-05: 500 [IU]

## 2022-04-05 NOTE — Telephone Encounter (Signed)
Pls get her seen at [facility] to rule out DVT. I do not have ability to see her today

## 2022-04-05 NOTE — Progress Notes (Signed)
Symptom Management Consult note [place]    Patient Care Team: Jonathon Jordan, MD as PCP - General (Family Medicine)    Name of the patient: Krista Johnson  [id number]  12-Sep-1950   Date of visit: 04/05/2022   Chief Complaint/Reason for visit: leg swelling   Current Therapy: finished Lenvima 03/25/22     ASSESSMENT & PLAN: Patient is a 71 y.o. female  with oncologic history of endometrial cancer followed by Dr. Alvy Bimler.  I have viewed most recent oncology note and lab work.    #) Endometrial cancer - Per last clinic visit with MD plan is to discontinue chemotherapy and have resection of oligometastatic disease. Patient scheduled for right axillary lymph node biopsy on 04/22/22 with Dr. Ebony Hail. - Next appointment with oncologist is 05/13/21   #) Bilateral lower extremity edema -Patient with 2+ pitting edema without history of the same. No history of HF and no respiratory symptoms. Weight is up 15 pounds in 11 days. -BNP mildly elevated at 175. Clear lung exam. -CBC is unremarkable. -CMP shows creatinine close to baseline, is 1.02 and baseline around 1. -Prescribed short course of lasix to help with edema. -Will get chest xray and DVT study for further work up. Discussed with Dr. Alvy Bimler who agrees with plan. If images are negative, encouraged her to follow up with pcp for ongoing evaluation. Patient has appointment scheduled with PCP next month already.   Strict ED precautions discussed should symptoms worsen.    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 Leo Grosser who obtained an endometrial biopsy showing poorly differentiated endometrial carcinoma and negative endocervical  curettage   12/12/2017 Imaging   MAMMOGRAM FINDINGS: In the right axilla, a possible mass warrants further evaluation. In the left breast, no findings suspicious for malignancy.   Images were processed with CAD.   IMPRESSION: Further evaluation is suggested for possible mass in the right axilla.     12/24/2017 Imaging   Ct scan chest, abdomen and pelvis 1. Marked thickening of the endometrium (42 mm) compatible with known primary endometrial malignancy. No evidence of extrauterine invasion. 2. No pelvic or retroperitoneal adenopathy. 3. Right middle lobe 4 mm solid pulmonary nodule, for which follow-up chest CT is advised in 3-6 months. 4. Several findings that are equivocal for distant metastatic disease. Vaguely nodular heterogeneous hyperenhancement in the peripheral right liver lobe, which could represent benign transient perfusional phenomena, with underlying liver lesions not entirely excluded. Mildly sclerotic T12 vertebral lesion. Mildly enlarged right axillary lymph node. The best single test to further evaluate these findings would be a PET-CT. Alternative tests include bone scan or thoracic MRI without and with IV contrast for the T12 osseous lesion, MRI abdomen without and with IV contrast for the liver findings, and diagnostic mammographic evaluation for the right axillary node. 5.  Aortic Atherosclerosis (ICD10-I70.0).     01/09/2018 PET scan   1. Moderate hypermetabolism corresponding to enlarging axillary node since 12/22/2017. Highly suspicious for an atypical distribution of metastatic disease. 2. No hypermetabolism to suggest hepatic or T12 osseous metastasis. 3. Hypermetabolic endometrial primary.   01/23/2018 Initial Diagnosis   Endometrial cancer ([place])   01/23/2018 Pathology Results   1. Lymph node, sentinel, biopsy, left external iliac - NO  CARCINOMA IDENTIFIED IN ONE LYMPH NODE (0/1) - SEE COMMENT 2. Lymph nodes, regional resection, right para aortic - NO CARCINOMA  IDENTIFIED IN FOUR LYMPH NODES (0/4) - SEE COMMENT 3. Lymph nodes, regional resection, right pelvic - NO CARCINOMA IDENTIFIED IN EIGHT LYMPH NODES (0/8) - SEE COMMENT 4. Cul-de-sac biopsy - METASTATIC CARCINOMA 5. Uterus +/- tubes/ovaries, neoplastic, cervix, bilateral fallopian tubes and ovaries UTERUS: - MIXED SEROUS AND ENDOMETRIOID CARCINOMA - SEROSAL IMPLANTS PRESENT - LYMPHOVASCULAR SPACE INVASION PRESENT - LEIOMYOMATA (1.5 CM; LARGEST) - SEE ONCOLOGY TABLE AND COMMENT BELOW CERVIX: - BENIGN NABOTHIAN CYSTS - NO CARCINOMA IDENTIFIED BILATERAL OVARIES: - METASTATIC CARCINOMA PRESENT ON OVARIAN SURFACE BILATERAL FALLOPIAN TUBES: - INTRALUMINAL CARCINOMA Microscopic Comment 1. -3. Cytokeratin AE1/3 was performed on the sentinel lymph nodes to exclude micrometastasis. There is no evidence of metastatic carcinoma by immunohistochemistry. 5. 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%) Histologic Grade: N/A Myometrial invasion: Estimated less than 50% myometrial invasion (0.3 cm of myometrium involved; 1.4 cm measured thickness) Uterine Serosa Involvement: Present Cervical stromal involvement: Not identified Extent of involvement of other organs: - Fallopian tube (left within the lumen) - Ovary, left (surface involvement) - Cul-de-sac Lymphovascular invasion: Present Regional Lymph Nodes: Examined: 1 Sentinel 12 Non-sentinel 13 Total Lymph nodes with metastasis: 0 Isolated tumor cells (< 0.2 mm): 0 Micrometastasis: (> 0.2 mm and < 2.0 mm): 0 Macrometastasis: (> 2.0 mm): 0 Extracapsular extension: N/A Tumor block for ancillary studies: 5E, 5B MMR / MSI testing: Pending will be reported separately Pathologic Stage Classification (pTNM, AJCC  8th edition): pT3a, pN0 FIGO Stage: IIIA COMMENT: There is tumor present on the surface of the left ovary and within the lumen of the left fallopian tube. The carcinoma appears to be mixed with the largest component being high grade serous carcinoma. Dr. Lyndon Code reviewed the case and agrees with the above diagnosis.   01/23/2018 Surgery   Surgeon: Donaciano Eva     Operation: Robotic-assisted laparoscopic total hysterectomy with bilateral salpingoophorectomy, SLN injection, mapping and biopsy, right pelvic and para-aortic lymphadenectomy   Operative Findings:  : 10-12cm bulky uterus with frank serosal involvement on posterior cul de sac peritoneum and anterior peritoneum which adhesed the bladder to the anterior uterus. Unilateral mapping on left pelvis. No grossly suspicious nodes. Normal omentum and diaphragms.    02/01/2018 Pathology Results   Lymph node, needle/core biopsy, right axilla - METASTATIC CARCINOMA - SEE COMMENT Microscopic Comment The neoplastic cells are positive for cytokeratin 7 and Pax-8 but negative for cytokeratin 5/6, cytokeratin 20, Gata-3, p63, p53 and GCDFP. Overall, the immunoprofile is consistent with metastasis from the patient's known gynecologic carcinoma.   02/01/2018 Procedure   Ultrasound-guided core biopsies of a suspicious right axillary lymph node.   02/21/2018 - 06/13/2018 Chemotherapy   The patient had carboplatin & Taxol x 6   03/26/2018 - 04/30/2018 Radiation Therapy   Radiation treatment dates:   03/26/18, 04/02/18, 04/19/18, 04/23/18 04/30/18   Site/dose: proximal Vagina, 6 Gy in 5 fractions for a total dose of 30 Gy     04/26/2018 PET scan   1. Interval resection of the hypermetabolic endometrial primary. No discernible hypermetabolic pelvic sidewall or peritoneal metastases. 2. Stable hypermetabolic bilateral axillary lymphadenopathy. The degree of hypermetabolism in these lymph nodes remains highly suspicious for neoplasm. 3. Interval development  of low level FDG uptake in 2  stable, small lymph nodes in the left groin region. This may be reactive, but close attention recommended to exclude metastatic involvement. 4. Stable non hypermetabolic low-density liver lesions and the mixed lucent and sclerotic T12 lesion is stable without hypermetabolism today.   07/09/2018 Imaging   Status post hysterectomy.   Mild axillary lymphadenopathy, improved. Nodal metastases not excluded.   Irregular wall thickening involving the anterior bladder, possibly reflecting radiation cystitis versus tumor.   Additional stable findings as above, including a 5 mm right middle lobe nodule and stable sclerosis involving the T12 vertebral body.   10/10/2018 Imaging   1. Stable exam. No findings within the abdomen or pelvis to suggest metastatic disease. 2. Unchanged appearance of sclerosis involving the T12 vertebra. 3.  Aortic Atherosclerosis (ICD10-I70.0).   10/10/2018 Imaging   Ct abdomen and pelvis 1. Stable exam. No findings within the abdomen or pelvis to suggest metastatic disease. 2. Unchanged appearance of sclerosis involving the T12 vertebra. 3.  Aortic Atherosclerosis (ICD10-I70.0).   01/16/2019 PET scan   1. Enlargement and increased activity in the left axillary, right axillary, and left inguinal adenopathy compatible with progressive malignancy. 2. Stable 4 mm right middle lobe subpleural nodule, not appreciably hypermetabolic. 3. Other imaging findings of potential clinical significance: Right kidney lower pole cyst. Aortic Atherosclerosis (ICD10-I70.0). Prominent stool throughout the colon favors constipation. Mild chronic left maxillary sinusitis.     02/01/2019 -  Chemotherapy   The patient had pembrolizumab and Lenvima for chemotherapy treatment.     04/25/2019 Imaging   Ct imaging 1. Interval response to therapy. Decrease in size of bilateral axillary and left inguinal lymph nodes. No new or progressive findings identified. 2. Stable  sclerotic appearance of the T12 vertebra. 3. Aortic atherosclerosis. Lad coronary artery calcification noted.   08/30/2019 Imaging   1. Bulky RIGHT hilar lymph node, slightly increased in size from previous imaging. 2. Multiple foci of hyperenhancement in the liver. 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 Consider MRI liver on follow-up to establish a baseline for number of lesions as these will appear variable on CT follow-up based on phase of contrast acquisition in the future. 3. Stable 5 mm nodule adjacent to the fissure, minor fissure in the RIGHT middle lobe.     11/28/2019 Imaging   1. Stable exam. No new or progressive findings. 2. Stable enlarged right axillary lymph node. 3. Stable tiny bilateral pulmonary nodules. Continued attention on follow-up recommended. 4. Scattered hypodensities in the liver parenchyma correspond to the hypervascular lesions seen on the previous study performed with intravenous contrast material. 5. Right renal cyst. 6. Aortic Atherosclerosis (ICD10-I70.0).     03/16/2020 Imaging   Increased echogenicity of renal parenchyma is noted bilaterally suggesting medical renal disease. No hydronephrosis or renal obstruction is noted   04/02/2020 Imaging   1. Right axillary lymphadenopathy is stable. Tiny bilateral pulmonary nodules are stable. Subcentimeter low-attenuation liver lesions are stable. 2. No new or progressive metastatic disease in the chest, abdomen or pelvis. 3. Aortic Atherosclerosis (ICD10-I70.0).   09/15/2020 Imaging   1. Stable RIGHT axillary lymph node with enlargement measuring 2 cm short axis. 2. Stable small nodule along the minor fissure in the RIGHT chest. 3. No new or progressive findings. 4. Aortic atherosclerosis.   03/24/2021 Imaging    1.  Right axillary lymphadenopathy is stable compared to prior studies. 2. No other definite signs of metastatic disease elsewhere in the thorax. 3. Hepatic steatosis. Numerous hypervascular areas noted in the liver, incompletely characterized on today's arterial phase examination. These are similar to remote prior examination from 08/30/2019, likely to represent small benign lesions such as flash fill cavernous hemangiomas. These could be definitively evaluated with nonemergent abdominal MRI with and without IV gadolinium if of clinical concern. 4. Aortic atherosclerosis, in addition to left main and left anterior descending coronary artery disease. Assessment for potential risk factor modification, dietary therapy or pharmacologic therapy may be warranted, if clinically indicated.   Aortic Atherosclerosis (ICD10-I70.0).   09/16/2021 Imaging   1. Unchanged enlarged right axillary lymph node. No evidence of new lymphadenopathy or metastatic disease in the chest, abdomen, or pelvis. 2. Subcentimeter hyperenhancing lesions of the right lobe of the liver, hepatic segments VII and VIII are unchanged, measuring up to 0.7 cm. These are likely small benign incidental flash filling hemangiomata although incompletely characterized on this examination. Attention on follow-up. 3. Status post hysterectomy. 4. Cholelithiasis. 5. Coronary artery disease.   Aortic Atherosclerosis (ICD10-I70.0).     12/31/2021 - 03/04/2022 Chemotherapy   Patient is on Treatment Plan : UTERINE Lenvatinib (20) D1-21 + Pembrolizumab (200) D1 q21d     03/24/2022 Imaging   1. Similar to mild enlargement of an isolated right axillary nodal mass. 2. No new sites of disease identified. 3.  Possible constipation. 4. Coronary artery atherosclerosis. Aortic Atherosclerosis (ICD10-I70.0).       Interval history-: Krista Johnson is a 71 y.o. female with oncologic history as above presenting to [facility] today with chief complaint of leg  swelling x 3 days.  She presents unaccompanied to clinic today.  Patient states the swelling in her legs has been unchanged since onset.  She denies any associated pain.  She reports the swelling does not improve with elevation.  She denies eating a high salt diet.  She denies any history of heart failure.  Patient reports she overall tolerated her chemo therapy well when she was taking it.  Patient noticed that her weight has slowly been creeping up.  At the surgeon's office last week she was 153 pounds and today she is 161 pounds she tells me. She did not take any OTC medications prior to arrival. She denies fever, chills, leg pain. She denies history of leg swelling. She did fall last month and landed on her right knee. She has arthritis in her right knee and get s around "just fine." She has a scab on her knee that she thinks is healing well.    ROS  All other systems are reviewed and are negative for acute change except as noted in the HPI.    Allergies  Allergen Reactions   Sulfa Antibiotics Nausea Only   Sulfamethoxazole Nausea Only     Past Medical History:  Diagnosis Date   Arthritis    right knee--- last cortisone injection 04/ 2019   CKD (chronic kidney disease)    Diabetes mellitus without complication [facility])    endocrinologist-  dr Loanne Drilling   Endometrial cancer [facility])    History of colon polyps    Hyperlipidemia    Hyperparathyroidism ([place])    Hypertension      Past Surgical History:  Procedure Laterality Date   COLONOSCOPY  last one 12-25-2017   IR IMAGING GUIDED PORT INSERTION  02/20/2018   ROBOTIC ASSISTED TOTAL HYSTERECTOMY WITH BILATERAL SALPINGO OOPHERECTOMY N/A  01/23/2018   Procedure: XI ROBOTIC ASSISTED TOTAL HYSTERECTOMY WITH BILATERAL SALPINGO OOPHORECTOMY;  Surgeon: Everitt Amber, MD;  Location: [facility] ORS;  Service: Gynecology;  Laterality: N/A;   SENTINEL NODE BIOPSY N/A 01/23/2018   Procedure: SENTINEL NODE BIOPSY;  Surgeon: Everitt Amber, MD;  Location: [facility]S;   Service: Gynecology;  Laterality: N/A;    Social History   Socioeconomic History   Marital status: Single    Spouse name: Not on file   Number of children: 0   Years of education: Not on file   Highest education level: Not on file  Occupational History   Occupation: retired Education officer, [employer]  Tobacco Use   Smoking status: Never   Smokeless tobacco: Never  Vaping Use   Vaping Use: Never used  Substance and Sexual Activity   Alcohol use: Not Currently    Alcohol/week: 0.0 standard drinks of alcohol   Drug use: Never   Sexual activity: Not on file  Other Topics Concern   Not on file  Social History Narrative   Not on file   Social Determinants of Health   Financial Resource Strain: Not on file  Food Insecurity: Not on file  Transportation Needs: Not on file  Physical Activity: Not on file  Stress: Not on file  Social Connections: Not on file  Intimate Partner Violence: Not on file    Family History  Problem Relation Age of Onset   Diabetes Mother    Hypertension Mother    Diabetes Sister    Hypertension Sister    Diabetes Maternal Uncle    Colon cancer Paternal Aunt    Diabetes Paternal Aunt    Stomach cancer Neg Hx    Rectal cancer Neg Hx    Esophageal cancer Neg Hx    Colon polyps Neg Hx      Current Outpatient Medications:    furosemide (LASIX) 20 MG tablet, Take 1 tablet (20 mg total) by mouth daily for 7 days., Disp: 7 tablet, Rfl: 0   amLODipine-valsartan (EXFORGE) 5-160 MG tablet, Take 1 tablet by mouth daily., Disp: , Rfl:    atenolol (TENORMIN) 25 MG tablet, Take 1 tablet (25 mg total) by mouth daily., Disp: 90 tablet, Rfl: 1   Azelastine HCl 137 MCG/SPRAY SOLN, Place into both nostrils., Disp: , Rfl:    Calcium Carb-Cholecalciferol 600-800 MG-UNIT TABS, Take 1 tablet by mouth daily. (Patient not taking: Reported on 12/09/2021), Disp: , Rfl:    calcium carbonate (TUMS - DOSED IN MG ELEMENTAL CALCIUM) 500 MG chewable tablet, Chew 2 tablets by mouth 2 (two)  times daily., Disp: , Rfl:    cholecalciferol (VITAMIN D3) 25 MCG (1000 UNIT) tablet, Take 5 tablets (5,000 Units total) by mouth daily., Disp: 30 tablet, Rfl: 2   ergocalciferol (VITAMIN D2) 1.25 MG (50000 UT) capsule, Take 1 capsule (50,000 Units total) by mouth once a week., Disp: 25 capsule, Rfl: 1   glucose blood (ONETOUCH VERIO) test strip, Use as instructed to check blood sugars 3 times a day., Disp: 100 each, Rfl: 3   lenvatinib 10 mg daily dose (LENVIMA, 10 MG DAILY DOSE,) capsule, Take 1 capsule (10 mg total) by mouth daily., Disp: 30 capsule, Rfl: 11   levothyroxine (EUTHYROX) 200 MCG tablet, Take 1 tablet (200 mcg total) by mouth daily before breakfast., Disp: 90 tablet, Rfl: 3   levothyroxine (EUTHYROX) 50 MCG tablet, Take 1 tablet (50 mcg total) by mouth daily before breakfast., Disp: 90 tablet, Rfl: 3   lidocaine-prilocaine (EMLA) cream, Apply  topically daily as needed., Disp: 30 g, Rfl: 3   OneTouch Delica Lancets 27N MISC, 1 each by Other route 2 (two) times daily. Use to monitor glucose levels BID; E11.65, Disp: 100 each, Rfl: 2   RELION PEN NEEDLES 32G X 4 MM MISC, USE 1 PEN PER DAY, Disp: 50 each, Rfl: 5   simvastatin (ZOCOR) 10 MG tablet, Take 10 mg by mouth at bedtime. , Disp: , Rfl:   PHYSICAL EXAM: ECOG FS:1 - Symptomatic but completely ambulatory    Vitals:   04/05/22 1408  BP: (!) 151/79  Pulse: 76  Resp: 18  Temp: 97.8 F (36.6 C)  TempSrc: Oral  SpO2: 100%  Weight: 161 lb 11.2 oz (73.3 kg)   Physical Exam Vitals and nursing note reviewed.  Constitutional:      Appearance: She is well-developed. She is not ill-appearing or toxic-appearing.  HENT:     Head: Normocephalic.     Nose: Nose normal.  Eyes:     Conjunctiva/sclera: Conjunctivae normal.  Neck:     Vascular: No JVD.  Cardiovascular:     Rate and Rhythm: Normal rate and regular rhythm.     Pulses: Normal pulses.          Dorsalis pedis pulses are 2+ on the right side and 2+ on the left side.      Heart sounds: Normal heart sounds.  Pulmonary:     Effort: Pulmonary effort is normal.     Breath sounds: Normal breath sounds.  Abdominal:     General: Bowel sounds are normal. There is no distension.     Palpations: Abdomen is soft.     Tenderness: There is no abdominal tenderness.  Musculoskeletal:     Cervical back: Normal range of motion.     Right lower leg: 2+ Pitting Edema present.     Left lower leg: 2+ Pitting Edema present.     Comments: Homans sign absent bilaterally, no palpable cords, compartments are soft    Skin:    General: Skin is warm and dry.     Comments: Equal tactile temperature on all extremities.  Scab on right knee without signs of infection  Neurological:     Mental Status: She is oriented to person, place, and time.        LABORATORY DATA: I have reviewed the data as listed    Latest Ref Rng & Units 04/05/2022    2:20 PM 03/23/2022    8:24 AM 03/04/2022    7:48 AM  CBC  WBC 4.0 - 10.5 K/uL 4.7  5.4  3.9   Hemoglobin 12.0 - 15.0 g/dL 12.4  14.7  13.7   Hematocrit 36.0 - 46.0 % 38.7  45.4  43.3   Platelets 150 - 400 K/uL 163  186  176         Latest Ref Rng & Units 04/05/2022    2:20 PM 03/23/2022    8:24 AM 03/04/2022    7:48 AM  CMP  Glucose 70 - 99 mg/dL 173  84  122   BUN 8 - 23 mg/dL _0 Creatinine 0.44 - 1.00 mg/dL 1.05  1.12  0.99   Sodium 135 - 145 mmol/L 144  142  142   Potassium 3.5 - 5.1 mmol/L 3.7  3.1  3.4   Chloride 98 - 111 mmol/L 114  110  111   CO2 22 - 32 mmol/L _1 Calcium 8.9 -  10.3 mg/dL 9.2  8.8  8.2   Total Protein 6.5 - 8.1 g/dL 5.8  6.3  6.0   Total Bilirubin 0.3 - 1.2 mg/dL 0.5  0.5  0.5   Alkaline Phos 38 - 126 U/L 75  73  61   AST 15 - 41 U/L _0 ALT 0 - 44 U/L _1 RADIOGRAPHIC STUDIES (from last 24 hours if applicable) I have personally reviewed the radiological images as listed and agreed with the findings in the report. No results found.       Visit Diagnosis: 1. Bilateral lower extremity edema   2. Endometrial cancer ([place])   3. 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    Answer:   [facility]   CBC with Differential ([place] Only)    Standing Status:   Future    Number of Occurrences:   1    Standing Expiration Date:   04/06/2023   BNP (Brain natriuretic peptide)    Standing Status:   Future    Number of Occurrences:   1    Standing Expiration Date:   04/05/2023   CMP ([place] only)    Standing Status:   Future    Number of Occurrences:   1    Standing Expiration Date:   04/06/2023    All questions were answered. The patient knows to call the clinic with any problems, questions or concerns. No barriers to learning was detected.  I have spent a total of 30 minutes minutes of face-to-face and non-face-to-face time, preparing to see the patient, obtaining and/or reviewing separately obtained history, performing a medically appropriate examination, counseling and educating the patient, ordering tests, documenting clinical information in the electronic health record, and care coordination (communications with other health care professionals or caregivers).    Thank you for allowing me to participate in the care of this patient.    Barrie Folk, PA-C Department of Hematology/Oncology [facility] Phone: [phone]  [phone]    04/05/2022 4:41 PM

## 2022-04-05 NOTE — Telephone Encounter (Signed)
Called by [facility] nurse and scheduled at 2 pm today to see K. [place], [place].

## 2022-04-05 NOTE — Telephone Encounter (Signed)
She called to report that she is scheduled for surgery on 1/5. On Sunday she started having swelling in her feet, more in the right foot. She tried elevating last night but it did not make a difference.  She is asking if you have any recommendations?

## 2022-04-06 ENCOUNTER — Ambulatory Visit (HOSPITAL_COMMUNITY)
Admission: RE | Admit: 2022-04-06 | Discharge: 2022-04-06 | Disposition: A | Discharge status: Home / Self Care | Payer: Medicare Other | Source: Ambulatory Visit | Attending: Physician Assistant | Admitting: Physician Assistant

## 2022-04-06 DIAGNOSIS — R600 Localized edema: Secondary | ICD-10-CM

## 2022-04-06 DIAGNOSIS — C541 Malignant neoplasm of endometrium: Secondary | ICD-10-CM | POA: Diagnosis not present

## 2022-04-06 NOTE — Progress Notes (Signed)
Bilateral lower extremity venous duplex has been completed. Preliminary results can be found in CV Proc through chart review.  Results were given to [facility] PA.  04/06/22 10:35 AM Carlos Levering RVT

## 2022-04-07 ENCOUNTER — Telehealth: Payer: Self-pay | Admitting: Physician Assistant

## 2022-04-07 NOTE — Telephone Encounter (Signed)
I called patient to notify her of negative DVT study and chest x-ray results.  X-ray did not show any signs of acute infectious processes or fluid overload.  Patient agreeable with plan to take Lasix as prescribed and will follow-up with PCP if she continues to have lower extremity edema.

## 2022-04-08 ENCOUNTER — Ambulatory Visit: Payer: Self-pay | Admitting: Surgery

## 2022-04-13 NOTE — Progress Notes (Signed)
Surgical Instructions    Your procedure is scheduled on January 5th 2024 Friday.  Report to [facility] Main Entrance "A" at 8:30 A.M., then check in with the Admitting office.  Call this number if you have problems the morning of surgery:  [phone]   If you have any questions prior to your surgery date call [phone]: Open Monday-Friday 8am-4pm If you experience any cold or flu symptoms such as cough, fever, chills, shortness of breath, etc. between now and your scheduled surgery, please notify us at the above number     Remember:  Do not eat after midnight the night before your surgery  You may drink clear liquids until 730am the morning of your surgery.   Clear liquids allowed are: Water, Non-Citrus Juices (without pulp), Carbonated Beverages, Clear Tea, Black Coffee ONLY (NO MILK, CREAM OR POWDERED CREAMER of any kind), and Gatorade    Take these medicines the morning of surgery with A SIP OF WATER: atenolol (TENORMIN) 25 MG tablet  levothyroxine (EUTHYROX) 200 MCG tablet  simvastatin (ZOCOR) 10 MG tablet   IF NEEDED  Azelastine HCl 0.15 % SOLN  diphenhydrAMINE (BENADRYL) 25 MG tablet  fexofenadine (ALLEGRA) 180 MG tablet   As of today, STOP taking any Aspirin (unless otherwise instructed by your surgeon) Aleve, Naproxen, Ibuprofen, Motrin, Advil, Goody's, BC's, all herbal medications, fish oil, and all vitamins.  WHAT DO I DO ABOUT MY DIABETES MEDICATION?   Do not take oral diabetes medicines (pills) the morning of surgery.  The day of surgery, do not take other diabetes injectables, including Byetta (exenatide), Bydureon (exenatide ER), Victoza (liraglutide), or Trulicity (dulaglutide).  If your CBG is greater than 220 mg/dL, you may take  of your sliding scale (correction) dose of insulin.   HOW TO MANAGE YOUR DIABETES BEFORE AND AFTER SURGERY  Why is it important to control my blood sugar before and after surgery? Improving blood sugar levels before and  after surgery helps healing and can limit problems. A way of improving blood sugar control is eating a healthy diet by:  Eating less sugar and carbohydrates  Increasing activity/exercise  Talking with your doctor about reaching your blood sugar goals High blood sugars (greater than 180 mg/dL) can raise your risk of infections and slow your recovery, so you will need to focus on controlling your diabetes during the weeks before surgery. Make sure that the doctor who takes care of your diabetes knows about your planned surgery including the date and location.  How do I manage my blood sugar before surgery? Check your blood sugar at least 4 times a day, starting 2 days before surgery, to make sure that the level is not too high or low.  Check your blood sugar the morning of your surgery when you wake up and every 2 hours until you get to the Short Stay unit.  If your blood sugar is less than 70 mg/dL, you will need to treat for low blood sugar: Do not take insulin. Treat a low blood sugar (less than 70 mg/dL) with  cup of clear juice (cranberry or apple), 4 glucose tablets, OR glucose gel. Recheck blood sugar in 15 minutes after treatment (to make sure it is greater than 70 mg/dL). If your blood sugar is not greater than 70 mg/dL on recheck, call [phone] for further instructions. Report your blood sugar to the short stay nurse when you get to Short Stay.  If you are admitted to the hospital after surgery: Your blood sugar will be  checked by the staff and you will probably be given insulin after surgery (instead of oral diabetes medicines) to make sure you have good blood sugar levels. The goal for blood sugar control after surgery is 80-180 mg/dL.     DAY OF SURGERY      Do not wear jewelry or makeup. Do not wear lotions, powders, perfumes or deodorant. Do not shave 48 hours prior to surgery.  Do not bring valuables to the hospital. Do not wear nail polish, gel polish, artificial  nails, or any other type of covering on natural nails (fingers and toes) If you have artificial nails or gel coating that need to be removed by a nail salon, please have this removed prior to surgery. Artificial nails or gel coating may interfere with anesthesia's ability to adequately monitor your vital signs.  [facility] is not responsible for any belongings or valuables.    Do NOT Smoke (Tobacco/Vaping)  24 hours prior to your procedure  If you use a CPAP at night, you may bring your mask for your overnight stay.   Contacts, glasses, hearing aids, dentures or partials may not be worn into surgery, please bring cases for these belongings   For patients admitted to the hospital, discharge time will be determined by your treatment team.   Patients discharged the day of surgery will not be allowed to drive home, and someone needs to stay with them for 24 hours.   SURGICAL WAITING ROOM VISITATION Patients having surgery or a procedure may have no more than 2 support people in the waiting area - these visitors may rotate.   Children under the age of 46 must have an adult with them who is not the patient. If the patient needs to stay at the hospital during part of their recovery, the visitor guidelines for inpatient rooms apply. Pre-op nurse will coordinate an appropriate time for 1 support person to accompany patient in pre-op.  This support person may not rotate.   Please refer to [email or web address] for the visitor guidelines for Inpatients (after your surgery is over and you are in a regular room).    Special instructions:    Oral Hygiene is also important to reduce your risk of infection.  Remember - BRUSH YOUR TEETH THE MORNING OF SURGERY WITH YOUR REGULAR TOOTHPASTE   [place]- Preparing For Surgery  Before surgery, you can play an important role. Because skin is not sterile, your skin needs to be as free of germs as possible.  You can reduce the number of germs on your skin by washing with CHG (chlorahexidine gluconate) Soap before surgery.  CHG is an antiseptic cleaner which kills germs and bonds with the skin to continue killing germs even after washing.     Please do not use if you have an allergy to CHG or antibacterial soaps. If your skin becomes reddened/irritated stop using the CHG.  Do not shave (including legs and underarms) for at least 48 hours prior to first CHG shower. It is OK to shave your face.  Please follow these instructions carefully.     Shower the NIGHT BEFORE SURGERY and the MORNING OF SURGERY with CHG Soap.   If you chose to wash your hair, wash your hair first as usual with your normal shampoo. After you shampoo, rinse your hair and body thoroughly to remove the shampoo.  Then [employer] and genitals (private parts) with your normal soap and rinse thoroughly to remove soap.  After that Use CHG Soap  as you would any other liquid soap. You can apply CHG directly to the skin and wash gently with a scrungie or a clean washcloth.   Apply the CHG Soap to your body ONLY FROM THE NECK DOWN.  Do not use on open wounds or open sores. Avoid contact with your eyes, ears, mouth and genitals (private parts). Wash Face and genitals (private parts)  with your normal soap.   Wash thoroughly, paying special attention to the area where your surgery will be performed.  Thoroughly rinse your body with warm water from the neck down.  DO NOT shower/wash with your normal soap after using and rinsing off the CHG Soap.  Pat yourself dry with a CLEAN TOWEL.  Wear CLEAN PAJAMAS to bed the night before surgery  Place CLEAN SHEETS on your bed the night before your surgery  DO NOT SLEEP WITH PETS.   Day of Surgery:  Take a shower with CHG soap. Wear Clean/Comfortable clothing the morning of surgery Do not apply any deodorants/lotions.   Remember to brush your teeth WITH YOUR REGULAR TOOTHPASTE.    If you  received a COVID test during your pre-op visit, it is requested that you wear a mask when out in public, stay away from anyone that may not be feeling well, and notify your surgeon if you develop symptoms. If you have been in contact with anyone that has tested positive in the last 10 days, please notify your surgeon.    Please read over the following fact sheets that you were given.

## 2022-04-14 ENCOUNTER — Other Ambulatory Visit: Payer: Self-pay

## 2022-04-14 ENCOUNTER — Encounter (HOSPITAL_COMMUNITY)
Admission: RE | Admit: 2022-04-14 | Discharge: 2022-04-14 | Disposition: A | Discharge status: Home / Self Care | Payer: Medicare Other | Source: Ambulatory Visit | Attending: Surgery | Admitting: Surgery

## 2022-04-14 ENCOUNTER — Encounter (HOSPITAL_COMMUNITY): Payer: Self-pay

## 2022-04-14 VITALS — BP 155/78 | HR 76 | Temp 97.6°F | Resp 18 | Ht 66.0 in | Wt 160.0 lb

## 2022-04-14 DIAGNOSIS — E1122 Type 2 diabetes mellitus with diabetic chronic kidney disease: Secondary | ICD-10-CM | POA: Diagnosis not present

## 2022-04-14 DIAGNOSIS — Z01818 Encounter for other preprocedural examination: Secondary | ICD-10-CM | POA: Diagnosis not present

## 2022-04-14 DIAGNOSIS — N1831 Chronic kidney disease, stage 3a: Secondary | ICD-10-CM | POA: Insufficient documentation

## 2022-04-14 LAB — GLUCOSE, CAPILLARY: Glucose-Capillary: 162 mg/dL — ABNORMAL HIGH (ref 70–99)

## 2022-04-14 NOTE — Pre-Procedure Instructions (Signed)
Surgical Instructions    Your procedure is scheduled on Friday, January 5th 2024.  Report to [facility] Main Entrance "A" at 8:30 A.M., then check in with the Admitting office.  Call this number if you have problems the morning of surgery:  [phone]   If you have any questions prior to your surgery date call [phone]: Open Monday-Friday 8am-4pm If you experience any cold or flu symptoms such as cough, fever, chills, shortness of breath, etc. between now and your scheduled surgery, please notify us at the above number     Remember:  Do not eat after midnight the night before your surgery  You may drink clear liquids until 7:30 AM the morning of your surgery.   Clear liquids allowed are: Water, Non-Citrus Juices (without pulp), Carbonated Beverages, Clear Tea, Black Coffee ONLY (NO MILK, CREAM OR POWDERED CREAMER of any kind), and Gatorade    Take these medicines the morning of surgery with A SIP OF WATER:  atenolol (TENORMIN) 25 MG tablet  levothyroxine (EUTHYROX) 200 MCG tablet  simvastatin (ZOCOR) 10 MG tablet   IF NEEDED  Azelastine HCl 0.15 % SOLN  diphenhydrAMINE (BENADRYL) 25 MG tablet  fexofenadine (ALLEGRA) 180 MG tablet   As of today, STOP taking any Aspirin (unless otherwise instructed by your surgeon) Aleve, Naproxen, Ibuprofen, Motrin, Advil, Goody's, BC's, all herbal medications, fish oil, and all vitamins.   HOW TO MANAGE YOUR DIABETES BEFORE AND AFTER SURGERY  Why is it important to control my blood sugar before and after surgery? Improving blood sugar levels before and after surgery helps healing and can limit problems. A way of improving blood sugar control is eating a healthy diet by:  Eating less sugar and carbohydrates  Increasing activity/exercise  Talking with your doctor about reaching your blood sugar goals High blood sugars (greater than 180 mg/dL) can raise your risk of infections and slow your recovery, so you will need to focus on  controlling your diabetes during the weeks before surgery. Make sure that the doctor who takes care of your diabetes knows about your planned surgery including the date and location.  How do I manage my blood sugar before surgery? Check your blood sugar at least 4 times a day, starting 2 days before surgery, to make sure that the level is not too high or low.  Check your blood sugar the morning of your surgery when you wake up and every 2 hours until you get to the Short Stay unit.  If your blood sugar is less than 70 mg/dL, you will need to treat for low blood sugar: Do not take insulin. Treat a low blood sugar (less than 70 mg/dL) with  cup of clear juice (cranberry or apple), 4 glucose tablets, OR glucose gel. Recheck blood sugar in 15 minutes after treatment (to make sure it is greater than 70 mg/dL). If your blood sugar is not greater than 70 mg/dL on recheck, call [phone] for further instructions. Report your blood sugar to the short stay nurse when you get to Short Stay.  If you are admitted to the hospital after surgery: Your blood sugar will be checked by the staff and you will probably be given insulin after surgery (instead of oral diabetes medicines) to make sure you have good blood sugar levels. The goal for blood sugar control after surgery is 80-180 mg/dL.     DAY OF SURGERY      Do not wear jewelry or makeup. Do not wear lotions, powders, perfumes or  deodorant. Do not shave 48 hours prior to surgery.  Do not bring valuables to the hospital. Do not wear nail polish, gel polish, artificial nails, or any other type of covering on natural nails (fingers and toes) If you have artificial nails or gel coating that need to be removed by a nail salon, please have this removed prior to surgery. Artificial nails or gel coating may interfere with anesthesia's ability to adequately monitor your vital signs.  [facility] is not responsible for any belongings or valuables.    Do  NOT Smoke (Tobacco/Vaping)  24 hours prior to your procedure  If you use a CPAP at night, you may bring your mask for your overnight stay.   Contacts, glasses, hearing aids, dentures or partials may not be worn into surgery, please bring cases for these belongings   For patients admitted to the hospital, discharge time will be determined by your treatment team.   Patients discharged the day of surgery will not be allowed to drive home, and someone needs to stay with them for 24 hours.   SURGICAL WAITING ROOM VISITATION Patients having surgery or a procedure may have no more than 2 support people in the waiting area - these visitors may rotate.   Children under the age of 91 must have an adult with them who is not the patient. If the patient needs to stay at the hospital during part of their recovery, the visitor guidelines for inpatient rooms apply. Pre-op nurse will coordinate an appropriate time for 1 support person to accompany patient in pre-op.  This support person may not rotate.   Please refer to [email or web address] for the visitor guidelines for Inpatients (after your surgery is over and you are in a regular room).    Special instructions:    Oral Hygiene is also important to reduce your risk of infection.  Remember - BRUSH YOUR TEETH THE MORNING OF SURGERY WITH YOUR REGULAR TOOTHPASTE   [place]- Preparing For Surgery  Before surgery, you can play an important role. Because skin is not sterile, your skin needs to be as free of germs as possible. You can reduce the number of germs on your skin by washing with CHG (chlorahexidine gluconate) Soap before surgery.  CHG is an antiseptic cleaner which kills germs and bonds with the skin to continue killing germs even after washing.     Please do not use if you have an allergy to CHG or antibacterial soaps. If your skin becomes reddened/irritated stop using the CHG.  Do not shave  (including legs and underarms) for at least 48 hours prior to first CHG shower. It is OK to shave your face.  Please follow these instructions carefully.     Shower the NIGHT BEFORE SURGERY and the MORNING OF SURGERY with CHG Soap.   If you chose to wash your hair, wash your hair first as usual with your normal shampoo. After you shampoo, rinse your hair and body thoroughly to remove the shampoo.  Then [employer] and genitals (private parts) with your normal soap and rinse thoroughly to remove soap.  After that Use CHG Soap as you would any other liquid soap. You can apply CHG directly to the skin and wash gently with a scrungie or a clean washcloth.   Apply the CHG Soap to your body ONLY FROM THE NECK DOWN.  Do not use on open wounds or open sores. Avoid contact with your eyes, ears, mouth and genitals (private parts). Wash Face and  genitals (private parts)  with your normal soap.   Wash thoroughly, paying special attention to the area where your surgery will be performed.  Thoroughly rinse your body with warm water from the neck down.  DO NOT shower/wash with your normal soap after using and rinsing off the CHG Soap.  Pat yourself dry with a CLEAN TOWEL.  Wear CLEAN PAJAMAS to bed the night before surgery  Place CLEAN SHEETS on your bed the night before your surgery  DO NOT SLEEP WITH PETS.   Day of Surgery:  Take a shower with CHG soap. Wear Clean/Comfortable clothing the morning of surgery Do not apply any deodorants/lotions.   Remember to brush your teeth WITH YOUR REGULAR TOOTHPASTE.    If you received a COVID test during your pre-op visit, it is requested that you wear a mask when out in public, stay away from anyone that may not be feeling well, and notify your surgeon if you develop symptoms. If you have been in contact with anyone that has tested positive in the last 10 days, please notify your surgeon.    Please read over the following fact sheets that you were  given.

## 2022-04-14 NOTE — Progress Notes (Signed)
PCP - Dr. Jonathon Jordan Cardiologist - Denies Oncologist: Dr. [name]  PPM/ICD - Denies  Chest x-ray - N/A EKG - 04/14/22 Stress Test - Denies ECHO - Denies Cardiac Cath - Denies  Sleep Study - Denies   Fasting Blood Sugar - 120 Checks Blood Sugar __2___ times a day  Blood Thinner Instructions: N/A Aspirin Instructions: N/A  ERAS Protcol - Yes PRE-SURGERY Ensure or G2- No  COVID TEST- N/A   Anesthesia review: Yes, recent chemo treatment  Patient denies shortness of breath, fever, cough and chest pain at PAT appointment   All instructions explained to the patient, with a verbal understanding of the material. Patient agrees to go over the instructions while at home for a better understanding. Patient also instructed to self quarantine after being tested for COVID-19. The opportunity to ask questions was provided.

## 2022-04-15 ENCOUNTER — Telehealth: Payer: Self-pay

## 2022-04-15 ENCOUNTER — Other Ambulatory Visit: Payer: Medicare Other

## 2022-04-15 ENCOUNTER — Ambulatory Visit: Payer: Medicare Other

## 2022-04-15 LAB — HEMOGLOBIN A1C
Hgb A1c MFr Bld: 6.6 % — ABNORMAL HIGH (ref 4.8–5.6)
Mean Plasma Glucose: 143 mg/dL

## 2022-04-15 NOTE — Telephone Encounter (Signed)
Returned patient's call regarding edema noted in BLE. Patient had been worked-up for this on 12/19. Patient had been prescribed 7-day supply (12/20-12/27) of lasix and patient noted improvement of symptoms with taking the medication. However, she is reporting increasing swelling following completion of medication. Hal Neer, PA-C notified. Patient encouraged to follow-up with primary care provider for further management and evaluation. Patient states that she has an appointment scheduled for later in January, but called the office today to see if she could be seen sooner.  Patient educated on additional interventions to manage swelling in legs. Patient verbalized an understanding of the information.  During call, patient noted increasing CBG levels. Patient encouraged to follow-up with endocrinologist, as she is an established patient, for further assessment. Patient agreed to call them and inform them of new changes. Patient knows to call this office back should she have any additional questions or concerns.

## 2022-04-19 DIAGNOSIS — R600 Localized edema: Secondary | ICD-10-CM | POA: Diagnosis not present

## 2022-04-19 DIAGNOSIS — I10 Essential (primary) hypertension: Secondary | ICD-10-CM | POA: Diagnosis not present

## 2022-04-19 DIAGNOSIS — E1169 Type 2 diabetes mellitus with other specified complication: Secondary | ICD-10-CM | POA: Diagnosis not present

## 2022-04-21 NOTE — Anesthesia Preprocedure Evaluation (Addendum)
Anesthesia Evaluation  Patient identified by MRN, date of birth, ID band Patient awake    Reviewed: Allergy & Precautions, NPO status , Patient's Chart, lab work & pertinent test results, reviewed documented beta blocker date and time   Airway Mallampati: I  TM Distance: >3 FB Neck ROM: Full    Dental no notable dental hx. (+) Teeth Intact, Dental Advisory Given   Pulmonary neg pulmonary ROS   Pulmonary exam normal breath sounds clear to auscultation       Cardiovascular hypertension, Pt. on home beta blockers and Pt. on medications Normal cardiovascular exam Rhythm:Regular Rate:Normal     Neuro/Psych negative neurological ROS  negative psych ROS   GI/Hepatic negative GI ROS, Neg liver ROS,,,  Endo/Other  diabetes, Well ControlledHypothyroidism    Renal/GU Renal InsufficiencyRenal disease  negative genitourinary   Musculoskeletal  (+) Arthritis ,    Abdominal   Peds  Hematology negative hematology ROS (+)   Anesthesia Other Findings H/o endometrial CA  Reproductive/Obstetrics                             Anesthesia Physical Anesthesia Plan  ASA: 3  Anesthesia Plan: General   Post-op Pain Management: Tylenol PO (pre-op)*   Induction: Intravenous  PONV Risk Score and Plan: 3 and Ondansetron, Dexamethasone and Treatment may vary due to age or medical condition  Airway Management Planned: LMA and Oral ETT  Additional Equipment:   Intra-op Plan:   Post-operative Plan: Extubation in OR  Informed Consent: I have reviewed the patients History and Physical, chart, labs and discussed the procedure including the risks, benefits and alternatives for the proposed anesthesia with the patient or authorized representative who has indicated his/her understanding and acceptance.     Dental advisory given  Plan Discussed with: CRNA  Anesthesia Plan Comments:        Anesthesia Quick  Evaluation

## 2022-04-22 ENCOUNTER — Other Ambulatory Visit: Payer: Self-pay

## 2022-04-22 ENCOUNTER — Encounter (HOSPITAL_COMMUNITY)
Admission: RE | Disposition: A | Discharge status: Home / Self Care | Payer: Self-pay | Source: Home / Self Care | Attending: Surgery

## 2022-04-22 ENCOUNTER — Ambulatory Visit (HOSPITAL_COMMUNITY)
Admission: RE | Admit: 2022-04-22 | Discharge: 2022-04-22 | Disposition: A | Discharge status: Home / Self Care | Payer: Medicare Other | Attending: Surgery | Admitting: Surgery

## 2022-04-22 ENCOUNTER — Encounter (HOSPITAL_COMMUNITY): Payer: Self-pay | Admitting: Surgery

## 2022-04-22 ENCOUNTER — Ambulatory Visit (HOSPITAL_COMMUNITY): Payer: Medicare Other | Admitting: Physician Assistant

## 2022-04-22 ENCOUNTER — Ambulatory Visit (HOSPITAL_BASED_OUTPATIENT_CLINIC_OR_DEPARTMENT_OTHER): Payer: Medicare Other | Admitting: Certified Registered"

## 2022-04-22 DIAGNOSIS — C969 Malignant neoplasm of lymphoid, hematopoietic and related tissue, unspecified: Secondary | ICD-10-CM | POA: Diagnosis not present

## 2022-04-22 DIAGNOSIS — Z794 Long term (current) use of insulin: Secondary | ICD-10-CM | POA: Insufficient documentation

## 2022-04-22 DIAGNOSIS — Z9221 Personal history of antineoplastic chemotherapy: Secondary | ICD-10-CM | POA: Diagnosis not present

## 2022-04-22 DIAGNOSIS — E1122 Type 2 diabetes mellitus with diabetic chronic kidney disease: Secondary | ICD-10-CM | POA: Diagnosis not present

## 2022-04-22 DIAGNOSIS — C541 Malignant neoplasm of endometrium: Secondary | ICD-10-CM | POA: Diagnosis not present

## 2022-04-22 DIAGNOSIS — Z8249 Family history of ischemic heart disease and other diseases of the circulatory system: Secondary | ICD-10-CM | POA: Diagnosis not present

## 2022-04-22 DIAGNOSIS — E039 Hypothyroidism, unspecified: Secondary | ICD-10-CM | POA: Insufficient documentation

## 2022-04-22 DIAGNOSIS — E119 Type 2 diabetes mellitus without complications: Secondary | ICD-10-CM | POA: Diagnosis not present

## 2022-04-22 DIAGNOSIS — R590 Localized enlarged lymph nodes: Secondary | ICD-10-CM | POA: Diagnosis not present

## 2022-04-22 DIAGNOSIS — Z79899 Other long term (current) drug therapy: Secondary | ICD-10-CM | POA: Insufficient documentation

## 2022-04-22 DIAGNOSIS — C773 Secondary and unspecified malignant neoplasm of axilla and upper limb lymph nodes: Secondary | ICD-10-CM | POA: Insufficient documentation

## 2022-04-22 DIAGNOSIS — M1990 Unspecified osteoarthritis, unspecified site: Secondary | ICD-10-CM

## 2022-04-22 DIAGNOSIS — I129 Hypertensive chronic kidney disease with stage 1 through stage 4 chronic kidney disease, or unspecified chronic kidney disease: Secondary | ICD-10-CM | POA: Insufficient documentation

## 2022-04-22 DIAGNOSIS — I10 Essential (primary) hypertension: Secondary | ICD-10-CM

## 2022-04-22 DIAGNOSIS — N189 Chronic kidney disease, unspecified: Secondary | ICD-10-CM | POA: Insufficient documentation

## 2022-04-22 DIAGNOSIS — Z833 Family history of diabetes mellitus: Secondary | ICD-10-CM | POA: Diagnosis not present

## 2022-04-22 DIAGNOSIS — N1831 Chronic kidney disease, stage 3a: Secondary | ICD-10-CM

## 2022-04-22 DIAGNOSIS — C801 Malignant (primary) neoplasm, unspecified: Secondary | ICD-10-CM | POA: Diagnosis not present

## 2022-04-22 HISTORY — PX: AXILLARY LYMPH NODE BIOPSY: SHX5737

## 2022-04-22 LAB — GLUCOSE, CAPILLARY
Glucose-Capillary: 97 mg/dL (ref 70–99)
Glucose-Capillary: 63 mg/dL — ABNORMAL LOW (ref 70–99)
Glucose-Capillary: 150 mg/dL — ABNORMAL HIGH (ref 70–99)
Glucose-Capillary: 84 mg/dL (ref 70–99)

## 2022-04-22 SURGERY — AXILLARY LYMPH NODE BIOPSY
Anesthesia: General | Laterality: Right

## 2022-04-22 MED ORDER — ONDANSETRON HCL 4 MG/2ML IJ SOLN
INTRAMUSCULAR | Status: AC
  Filled 2022-04-22: qty 2

## 2022-04-22 MED ORDER — ACETAMINOPHEN 500 MG PO TABS
1000.0000 mg | ORAL_TABLET | ORAL | Status: AC
  Administered 2022-04-22: 1000 mg via ORAL
  Filled 2022-04-22: qty 2

## 2022-04-22 MED ORDER — BUPIVACAINE-EPINEPHRINE (PF) 0.5% -1:200000 IJ SOLN
INTRAMUSCULAR | Status: AC
  Filled 2022-04-22: qty 30

## 2022-04-22 MED ORDER — DEXTROSE 50 % IV SOLN
12.5000 g | INTRAVENOUS | Status: AC
  Filled 2022-04-22: qty 50

## 2022-04-22 MED ORDER — PROPOFOL 10 MG/ML IV BOLUS
INTRAVENOUS | Status: DC | PRN
  Administered 2022-04-22: 130 mg via INTRAVENOUS

## 2022-04-22 MED ORDER — GABAPENTIN 300 MG PO CAPS
300.0000 mg | ORAL_CAPSULE | ORAL | Status: AC
  Administered 2022-04-22: 300 mg via ORAL
  Filled 2022-04-22: qty 1

## 2022-04-22 MED ORDER — PROPOFOL 10 MG/ML IV BOLUS
INTRAVENOUS | Status: AC
  Filled 2022-04-22: qty 20

## 2022-04-22 MED ORDER — LIDOCAINE 2% (20 MG/ML) 5 ML SYRINGE
INTRAMUSCULAR | Status: AC
  Filled 2022-04-22: qty 5

## 2022-04-22 MED ORDER — DEXAMETHASONE SODIUM PHOSPHATE 10 MG/ML IJ SOLN
INTRAMUSCULAR | Status: DC | PRN
  Administered 2022-04-22: 5 mg via INTRAVENOUS

## 2022-04-22 MED ORDER — LACTATED RINGERS IV SOLN: INTRAVENOUS | Status: DC

## 2022-04-22 MED ORDER — FENTANYL CITRATE (PF) 250 MCG/5ML IJ SOLN
INTRAMUSCULAR | Status: DC | PRN
  Administered 2022-04-22: 25 ug via INTRAVENOUS

## 2022-04-22 MED ORDER — ORAL CARE MOUTH RINSE: 15.0000 mL | Freq: Once | OROMUCOSAL | Status: AC

## 2022-04-22 MED ORDER — DEXAMETHASONE SODIUM PHOSPHATE 10 MG/ML IJ SOLN
INTRAMUSCULAR | Status: AC
  Filled 2022-04-22: qty 1

## 2022-04-22 MED ORDER — FENTANYL CITRATE (PF) 100 MCG/2ML IJ SOLN
25.0000 ug | INTRAMUSCULAR | Status: DC | PRN
  Administered 2022-04-22 (×2): 25 ug via INTRAVENOUS

## 2022-04-22 MED ORDER — CHLORHEXIDINE GLUCONATE CLOTH 2 % EX PADS: 6.0000 | MEDICATED_PAD | Freq: Once | CUTANEOUS | Status: DC

## 2022-04-22 MED ORDER — FENTANYL CITRATE (PF) 100 MCG/2ML IJ SOLN
INTRAMUSCULAR | Status: AC
  Filled 2022-04-22: qty 2

## 2022-04-22 MED ORDER — BUPIVACAINE LIPOSOME 1.3 % IJ SUSP: 20.0000 mL | Freq: Once | INTRAMUSCULAR | Status: DC

## 2022-04-22 MED ORDER — ENOXAPARIN SODIUM 40 MG/0.4ML IJ SOSY
40.0000 mg | PREFILLED_SYRINGE | Freq: Once | INTRAMUSCULAR | Status: AC
  Administered 2022-04-22: 40 mg via SUBCUTANEOUS
  Filled 2022-04-22: qty 0.4

## 2022-04-22 MED ORDER — CHLORHEXIDINE GLUCONATE 0.12 % MT SOLN
15.0000 mL | Freq: Once | OROMUCOSAL | Status: AC
  Administered 2022-04-22: 15 mL via OROMUCOSAL
  Filled 2022-04-22: qty 15

## 2022-04-22 MED ORDER — ONDANSETRON HCL 4 MG/2ML IJ SOLN
INTRAMUSCULAR | Status: DC | PRN
  Administered 2022-04-22: 4 mg via INTRAVENOUS

## 2022-04-22 MED ORDER — MIDAZOLAM HCL 2 MG/2ML IJ SOLN
INTRAMUSCULAR | Status: DC | PRN
  Administered 2022-04-22: 2 mg via INTRAVENOUS

## 2022-04-22 MED ORDER — CEFAZOLIN SODIUM-DEXTROSE 2-4 GM/100ML-% IV SOLN
2.0000 g | INTRAVENOUS | Status: AC
  Administered 2022-04-22: 2 g via INTRAVENOUS
  Filled 2022-04-22: qty 100

## 2022-04-22 MED ORDER — MIDAZOLAM HCL 2 MG/2ML IJ SOLN
INTRAMUSCULAR | Status: AC
  Filled 2022-04-22: qty 2

## 2022-04-22 MED ORDER — BUPIVACAINE-EPINEPHRINE 0.5% -1:200000 IJ SOLN
INTRAMUSCULAR | Status: DC | PRN
  Administered 2022-04-22: 30 mL

## 2022-04-22 MED ORDER — LIDOCAINE 2% (20 MG/ML) 5 ML SYRINGE
INTRAMUSCULAR | Status: DC | PRN
  Administered 2022-04-22: 60 mg via INTRAVENOUS

## 2022-04-22 MED ORDER — FENTANYL CITRATE (PF) 250 MCG/5ML IJ SOLN
INTRAMUSCULAR | Status: AC
  Filled 2022-04-22: qty 5

## 2022-04-22 MED ORDER — OXYCODONE-ACETAMINOPHEN 5-325 MG PO TABS: 1.0000 | ORAL_TABLET | ORAL | 0 refills | Status: DC | PRN

## 2022-04-22 MED ORDER — 0.9 % SODIUM CHLORIDE (POUR BTL) OPTIME
TOPICAL | Status: DC | PRN
  Administered 2022-04-22: 1000 mL

## 2022-04-22 MED ORDER — DEXTROSE 50 % IV SOLN
INTRAVENOUS | Status: AC
  Administered 2022-04-22: 12.5 g via INTRAVENOUS
  Filled 2022-04-22: qty 50

## 2022-04-22 SURGICAL SUPPLY — 32 items
APPLICATOR CHLORAPREP 10.5 ORG (MISCELLANEOUS) ×1 IMPLANT
BAG COUNTER SPONGE SURGICOUNT (BAG) ×1 IMPLANT
CNTNR URN SCR LID CUP LEK RST (MISCELLANEOUS) ×1 IMPLANT
CONT SPEC 4OZ STRL OR WHT (MISCELLANEOUS) ×1
COVER SURGICAL LIGHT HANDLE (MISCELLANEOUS) ×1 IMPLANT
DERMABOND ADVANCED .7 DNX12 (GAUZE/BANDAGES/DRESSINGS) ×1 IMPLANT
DRAPE LAPAROTOMY 100X72 PEDS (DRAPES) ×1 IMPLANT
ELECT REM PT RETURN 9FT ADLT (ELECTROSURGICAL) ×1
ELECTRODE REM PT RTRN 9FT ADLT (ELECTROSURGICAL) ×1 IMPLANT
GAUZE 4X4 16PLY ~~LOC~~+RFID DBL (SPONGE) ×1 IMPLANT
GLOVE SURG SIGNA 7.5 PF LTX (GLOVE) ×1 IMPLANT
GOWN STRL REUS W/ TWL LRG LVL3 (GOWN DISPOSABLE) ×1 IMPLANT
GOWN STRL REUS W/ TWL XL LVL3 (GOWN DISPOSABLE) ×1 IMPLANT
GOWN STRL REUS W/TWL LRG LVL3 (GOWN DISPOSABLE) ×1
GOWN STRL REUS W/TWL XL LVL3 (GOWN DISPOSABLE) ×1
KIT BASIN OR (CUSTOM PROCEDURE TRAY) ×1 IMPLANT
KIT TURNOVER KIT B (KITS) ×1 IMPLANT
NDL HYPO 25GX1X1/2 BEV (NEEDLE) ×1 IMPLANT
NEEDLE HYPO 25GX1X1/2 BEV (NEEDLE) ×1 IMPLANT
NS IRRIG 1000ML POUR BTL (IV SOLUTION) ×1 IMPLANT
PACK GENERAL/GYN (CUSTOM PROCEDURE TRAY) ×1 IMPLANT
PAD ARMBOARD 7.5X6 YLW CONV (MISCELLANEOUS) ×1 IMPLANT
PENCIL SMOKE EVACUATOR (MISCELLANEOUS) ×1 IMPLANT
SPIKE FLUID TRANSFER (MISCELLANEOUS) IMPLANT
SUT MNCRL AB 4-0 PS2 18 (SUTURE) ×1 IMPLANT
SUT VIC AB 2-0 SH 18 (SUTURE) IMPLANT
SUT VIC AB 3-0 SH 27 (SUTURE) ×2
SUT VIC AB 3-0 SH 27XBRD (SUTURE) ×1 IMPLANT
SYR CONTROL 10ML LL (SYRINGE) ×1 IMPLANT
TOWEL GREEN STERILE (TOWEL DISPOSABLE) ×1 IMPLANT
TOWEL GREEN STERILE FF (TOWEL DISPOSABLE) ×1 IMPLANT
WATER STERILE IRR 1000ML POUR (IV SOLUTION) IMPLANT

## 2022-04-22 NOTE — H&P (Signed)
Admitting Physician: Nickola Major Jamorris Ndiaye  Service: General Surgery  CC: Right axillary lymph node biopsy  Subjective   HPI: Krista Johnson is an 72 y.o. female who is here for right axillary lymph node biopsy.  Krista Johnson is a 72 y.o. female who is seen today as an office consultation for evaluation of New Consultation .   Ms. Holston was referred to the general surgery office for a right axillary lymph node excision. She has been under the care of Dr. Alvy Bimler and responded well to chemotherapy for endometrial cancer with lymph node spread. She has 1 persistent lymph node in the right axillary region, and Dr. Alvy Bimler refers her to our office for resection of this oligometastatic endometrial cancer.   Past Medical History:  Diagnosis Date   Arthritis    right knee--- last cortisone injection 04/ 2019   CKD (chronic kidney disease)    Diabetes mellitus without complication [facility])    endocrinologist-  dr Loanne Drilling   Endometrial cancer [facility])    History of colon polyps    Hyperlipidemia    Hyperparathyroidism ([place])    Hypertension     Past Surgical History:  Procedure Laterality Date   COLONOSCOPY  last one 12-25-2017   IR IMAGING GUIDED PORT INSERTION  02/20/2018   ROBOTIC ASSISTED TOTAL HYSTERECTOMY WITH BILATERAL SALPINGO OOPHERECTOMY N/A 01/23/2018   Procedure: XI ROBOTIC ASSISTED TOTAL HYSTERECTOMY WITH BILATERAL SALPINGO OOPHORECTOMY;  Surgeon: Everitt Amber, MD;  Location: [facility];  Service: Gynecology;  Laterality: N/A;   SENTINEL NODE BIOPSY N/A 01/23/2018   Procedure: SENTINEL NODE BIOPSY;  Surgeon: Everitt Amber, MD;  Location: [facility];  Service: Gynecology;  Laterality: N/A;    Family History  Problem Relation Age of Onset   Diabetes Mother    Hypertension Mother    Diabetes Sister    Hypertension Sister    Diabetes Maternal Uncle    Colon cancer Paternal Aunt    Diabetes Paternal Aunt    Stomach cancer Neg Hx    Rectal cancer Neg Hx    Esophageal cancer Neg Hx     Colon polyps Neg Hx     Social:  reports that she has never smoked. She has never used smokeless tobacco. She reports that she does not currently use alcohol. She reports that she does not use drugs.  Allergies:  Allergies  Allergen Reactions   Sulfa Antibiotics Nausea Only   Sulfamethoxazole Nausea Only    Medications: Current Outpatient Medications  Medication Instructions   amLODipine-valsartan (EXFORGE) 5-160 MG tablet 1 tablet, Oral, Daily after breakfast   atenolol (TENORMIN) 25 mg, Oral, Daily   Azelastine HCl 0.15 % SOLN 1-2 sprays, Each Nare, Daily PRN   calcium elemental as carbonate (BARIATRIC TUMS ULTRA) 400 MG chewable tablet 2 tablets, Oral, Daily   cholecalciferol (VITAMIN D3) 5,000 Units, Oral, Daily   diphenhydrAMINE (BENADRYL) 25 mg, Oral, Daily PRN   ergocalciferol (VITAMIN D2) 50,000 Units, Oral, Weekly   fexofenadine (ALLEGRA) 180 mg, Oral, Daily PRN   furosemide (LASIX) 20 mg, Oral, Daily   glucose blood (ONETOUCH VERIO) test strip Use as instructed to check blood sugars 3 times a day.   levothyroxine (EUTHYROX) 200 mcg, Oral, Daily before breakfast   levothyroxine (EUTHYROX) 50 mcg, Oral, Daily before breakfast   lidocaine-prilocaine (EMLA) cream Topical, Daily PRN   OneTouch Delica Lancets 82N MISC 1 each, Other, 2 times daily, Use to monitor glucose levels BID; E11.65   RELION PEN NEEDLES 32G X 4 MM MISC  USE 1 PEN PER DAY   simvastatin (ZOCOR) 10 mg, Oral, Daily at bedtime   Vitamin D3 5,000 Units, Oral, Daily after lunch    ROS - all of the below systems have been reviewed with the patient and positives are indicated with bold text General: chills, fever or night sweats Eyes: blurry vision or double vision ENT: epistaxis or sore throat Allergy/Immunology: itchy/watery eyes or nasal congestion Hematologic/Lymphatic: bleeding problems, blood clots or swollen lymph nodes Endocrine: temperature intolerance or unexpected weight changes Breast: new or  changing breast lumps or nipple discharge Resp: cough, shortness of breath, or wheezing CV: chest pain or dyspnea on exertion GI: as per HPI GU: dysuria, trouble voiding, or hematuria MSK: joint pain or joint stiffness Neuro: TIA or stroke symptoms Derm: pruritus and skin lesion changes Psych: anxiety and depression  Objective   PE Blood pressure (!) 146/82, pulse 70, temperature 98.6 F (37 C), temperature source Oral, resp. rate 18, height '5\' 6"'$  (1.676 m), weight 72.6 kg, SpO2 98 %. Constitutional: NAD; conversant; no deformities Eyes: Moist conjunctiva; no lid lag; anicteric; PERRL Neck: Trachea midline; no thyromegaly Lungs: Normal respiratory effort; no tactile fremitus CV: RRR; no palpable thrills; no pitting edema GI: Abd Soft, nontender; no palpable hepatosplenomegaly MSK: Normal range of motion of extremities; no clubbing/cyanosis Psychiatric: Appropriate affect; alert and oriented x3  Lymphatic -firm, mobile right axillary mass does not feel fixed underlying tissues   Results for orders placed or performed during the hospital encounter of 04/22/22 (from the past 24 hour(s))  Glucose, capillary     Status: None   Collection Time: 04/22/22  8:51 AM  Result Value Ref Range   Glucose-Capillary 97 70 - 99 mg/dL    Imaging Orders  No imaging studies ordered today   CT C/A/P April 03, 2022: 1. Similar to mild enlargement of an isolated right axillary nodal mass. 2. No new sites of disease identified. 3. Possible constipation. 4. Coronary artery atherosclerosis. Aortic Atherosclerosis (ICD10-I70.0).  PET/CT 01/16/2019: 1. Enlargement and increased activity in the left axillary, right axillary, and left inguinal adenopathy compatible with progressive malignancy. 2. Stable 4 mm right middle lobe subpleural nodule, not appreciably hypermetabolic. 3. Other imaging findings of potential clinical significance: Right kidney lower pole cyst. Aortic Atherosclerosis  (ICD10-I70.0). Prominent stool throughout the colon favors constipation. Mild chronic left maxillary sinusitis.   Assessment and Plan   Lymphadenopathy Right axillary    I recommended right axillary lymph node biopsy. The procedure itself as well as its risk, benefits, and alternatives were discussed the patient in full. After full discussion all questions answered the patient granted consent to proceed.     Felicie Morn, MD  [facility] Use [email or web address] to contact on call provider

## 2022-04-22 NOTE — Op Note (Signed)
   Patient: Krista Johnson ([birth year], [id number])  Date of Surgery: 04/22/2022   Preoperative Diagnosis: RIGHT AXILLARY LYMPHADENOPATHY   Postoperative Diagnosis: RIGHT AXILLARY LYMPHADENOPATHY   Surgical Procedure: RIGHT AXILLARY LYMPH NODE BIOPSY:    Operative Team Members:  Surgeon(s) and Role:    * Naren Benally, Nickola Major, MD - Primary   Anesthesiologist: Audry Pili, MD CRNA: Valda Favia, CRNA; Carolan Clines, CRNA   Anesthesia: General   Fluids:  No intake/output data recorded.  Complications: * No complications entered in OR log *  Drains:  none   Specimen:  ID Type Source Tests Collected by Time Destination  1 : Right Axillary Lymph Node Tissue PATH Lymph nodes regional SURGICAL PATHOLOGY Ovidio Steele, Nickola Major, MD 04/22/2022 1040      Disposition:  PACU - hemodynamically stable.  Plan of Care: Discharge to home after PACU    Indications for Procedure: [name] is a 72 y.o. female who presented with right axillary lymphadenopathy. I recommended right axillary lymph node biopsy. The procedure itself as well as its risk, benefits, and alternatives were discussed the patient in full. After full discussion all questions answered the patient granted consent to proceed.   Findings: Firm enlarged right axillary lymph node   Description of Procedure:   On the date stated above the patient was taken operating room placed in supine position.  Anesthesia was induced.  A timeout was completed verifying correct patient, procedure, positioning, and equipment needed for the case.  The patient's chest and right axilla was prepped and draped in the usual sterile fashion.  I made a incision along the hairline anterior to the axilla and dissected through the subcutaneous tissues into the axilla.  Open the axillary fascia and palpated for the lymph node.  The lymph node was dissected circumferentially.  The blood supply of the lymph node was controlled using two 2-0  Vicryl sutures.  Was passed off the field as a specimen.  The wound was irrigated and hemostasis was obtained.  The wound was closed in layers with 2-0 Vicryl suture running the deeper layers together and 4-0 Monocryl and Dermabond for the skin.  All sponge needle counts were correct at the end of the case.    At the end of the case we reviewed the infection status of the case. Patient: Private Patient Elective Case Case: Elective Infection Present At Time Of Surgery (PATOS): None  Louanna Raw, MD General, Bariatric, & Minimally Invasive Surgery [facility], [place]

## 2022-04-22 NOTE — Anesthesia Postprocedure Evaluation (Signed)
Anesthesia Post Note  Patient: Krista Johnson  Procedure(s) Performed: RIGHT AXILLARY LYMPH NODE BIOPSY (Right)     Patient location during evaluation: PACU Anesthesia Type: General Level of consciousness: awake and alert Pain management: pain level controlled Vital Signs Assessment: post-procedure vital signs reviewed and stable Respiratory status: spontaneous breathing, nonlabored ventilation and respiratory function stable Cardiovascular status: stable and blood pressure returned to baseline Anesthetic complications: no   No notable events documented.  Last Vitals:  Vitals:   04/22/22 1247 04/22/22 1302  BP:  (!) 162/89  Pulse: 71 70  Resp: 17 16  Temp:    SpO2: 100% 100%    Last Pain:  Vitals:   04/22/22 0935  TempSrc:   PainSc: 0-No pain                 Audry Pili

## 2022-04-22 NOTE — Transfer of Care (Signed)
Immediate Anesthesia Transfer of Care Note  Patient: Krista Johnson  Procedure(s) Performed: RIGHT AXILLARY LYMPH NODE BIOPSY (Right)  Patient Location: PACU  Anesthesia Type:General  Level of Consciousness: awake, alert , and oriented  Airway & Oxygen Therapy: Patient Spontanous Breathing  Post-op Assessment: Report given to RN and Post -op Vital signs reviewed and stable  Post vital signs: Reviewed and stable  Last Vitals:  Vitals Value Taken Time  BP 151/84 04/22/22 1226  Temp    Pulse 71 04/22/22 1230  Resp 14 04/22/22 1230  SpO2 100 % 04/22/22 1230  Vitals shown include unvalidated device data.  Last Pain:  Vitals:   04/22/22 0935  TempSrc:   PainSc: 0-No pain         Complications: No notable events documented.

## 2022-04-22 NOTE — Anesthesia Procedure Notes (Signed)
Procedure Name: LMA Insertion Date/Time: 04/22/2022 11:31 AM  Performed by: Valda Favia, CRNAPre-anesthesia Checklist: Patient identified, Emergency Drugs available, Suction available and Patient being monitored Patient Re-evaluated:Patient Re-evaluated prior to induction Oxygen Delivery Method: Circle System Utilized Preoxygenation: Pre-oxygenation with 100% oxygen Induction Type: IV induction Ventilation: Mask ventilation without difficulty LMA: LMA inserted LMA Size: 4.0 Number of attempts: 1 Placement Confirmation: positive ETCO2 Tube secured with: Tape Dental Injury: Teeth and Oropharynx as per pre-operative assessment

## 2022-04-23 ENCOUNTER — Encounter (HOSPITAL_COMMUNITY): Payer: Self-pay | Admitting: Surgery

## 2022-04-25 ENCOUNTER — Telehealth: Payer: Self-pay

## 2022-04-25 NOTE — Telephone Encounter (Signed)
-----   Message from [name], MD sent at 04/25/2022 12:46 PM EST ----- She has surgery last week I can see her either on 1/23 or 1/25 at 8 am to review plan of care, pls schedule

## 2022-04-25 NOTE — Telephone Encounter (Signed)
Called and scheduled appt with Dr. Alvy Bimler on 1/25 at 8 am per her preference.

## 2022-04-26 ENCOUNTER — Encounter: Payer: Self-pay | Admitting: Oncology

## 2022-04-26 NOTE — Progress Notes (Signed)
Requested MSI, PD-L1 and ER/PR on accession [id number] with [facility] via email.

## 2022-04-29 LAB — SURGICAL PATHOLOGY

## 2022-05-02 NOTE — Telephone Encounter (Signed)
Oral Oncology Patient Advocate Encounter   Received notification re-enrollment for assistance for Lenvima through [employer] has been approved. Patient may continue to receive their medication at $0 from this program.    [place] phone number [phone].   Effective dates: 04/18/22 through 04/18/23  I have spoken to the patient.  Lady Deutscher, CPhT-Adv Oncology Pharmacy Patient [facility] Direct Number: [phone]  Fax: [phone]

## 2022-05-09 ENCOUNTER — Other Ambulatory Visit: Payer: Self-pay | Admitting: Hematology and Oncology

## 2022-05-09 DIAGNOSIS — I10 Essential (primary) hypertension: Secondary | ICD-10-CM | POA: Diagnosis not present

## 2022-05-09 DIAGNOSIS — E039 Hypothyroidism, unspecified: Secondary | ICD-10-CM | POA: Diagnosis not present

## 2022-05-09 DIAGNOSIS — E1169 Type 2 diabetes mellitus with other specified complication: Secondary | ICD-10-CM | POA: Diagnosis not present

## 2022-05-12 ENCOUNTER — Other Ambulatory Visit: Payer: Self-pay

## 2022-05-12 ENCOUNTER — Inpatient Hospital Stay: Payer: Medicare Other | Attending: Gynecology | Admitting: Hematology and Oncology

## 2022-05-12 ENCOUNTER — Encounter: Payer: Self-pay | Admitting: Hematology and Oncology

## 2022-05-12 VITALS — BP 124/69 | HR 75 | Temp 97.4°F | Resp 18 | Ht 66.0 in | Wt 151.0 lb

## 2022-05-12 DIAGNOSIS — E039 Hypothyroidism, unspecified: Secondary | ICD-10-CM | POA: Diagnosis not present

## 2022-05-12 DIAGNOSIS — C773 Secondary and unspecified malignant neoplasm of axilla and upper limb lymph nodes: Secondary | ICD-10-CM | POA: Diagnosis not present

## 2022-05-12 DIAGNOSIS — C541 Malignant neoplasm of endometrium: Secondary | ICD-10-CM | POA: Diagnosis not present

## 2022-05-12 DIAGNOSIS — Z79811 Long term (current) use of aromatase inhibitors: Secondary | ICD-10-CM | POA: Insufficient documentation

## 2022-05-12 DIAGNOSIS — Z90710 Acquired absence of both cervix and uterus: Secondary | ICD-10-CM | POA: Diagnosis not present

## 2022-05-12 DIAGNOSIS — I10 Essential (primary) hypertension: Secondary | ICD-10-CM | POA: Diagnosis not present

## 2022-05-12 MED ORDER — LETROZOLE 2.5 MG PO TABS: 2.5000 mg | ORAL_TABLET | Freq: Every day | ORAL | 3 refills | Status: DC

## 2022-05-12 NOTE — Assessment & Plan Note (Signed)
I anticipate improvement or changes to her thyroid function with discontinuation of pembrolizumab I will check her labs again next month

## 2022-05-12 NOTE — Assessment & Plan Note (Signed)
I anticipate her blood pressure will improve with discontinuation of Lenvima I recommend atenolol taper

## 2022-05-12 NOTE — Assessment & Plan Note (Addendum)
Overall, she had successful surgical resection of remaining lymph nodes seen on PET/CT imaging I gave the patient a copy of her pathology report Given strong estrogen receptor positivity, I recommend antiestrogen therapy as a role of adjuvant treatment for her I recommend the patient to start letrozole on February 1 I plan to see her at the end of the month for further follow-up We discussed the risk, benefits, side effects of antiestrogen therapy including hot flashes, weight gain as well as osteopenia and she is in agreement to proceed I plan to repeat imaging study in March

## 2022-05-12 NOTE — Progress Notes (Signed)
[facility] OFFICE PROGRESS NOTE  Patient Care Team: Jonathon Jordan, MD as PCP - General (Family Medicine)  ASSESSMENT & PLAN:  Endometrial cancer ([place]) Overall, she had successful surgical resection of remaining lymph nodes seen on PET/CT imaging I gave the patient a copy of her pathology report Given strong estrogen receptor positivity, I recommend antiestrogen therapy as a role of adjuvant treatment for her I recommend the patient to start letrozole on February 1 I plan to see her at the end of the month for further follow-up We discussed the risk, benefits, side effects of antiestrogen therapy including hot flashes, weight gain as well as osteopenia and she is in agreement to proceed I plan to repeat imaging study in March  Acquired hypothyroidism I anticipate improvement or changes to her thyroid function with discontinuation of pembrolizumab I will check her labs again next month  Essential hypertension I anticipate her blood pressure will improve with discontinuation of Lenvima I recommend atenolol taper  Orders Placed This Encounter  Procedures   CBC with Differential/Platelet    Standing Status:   Standing    Number of Occurrences:   22    Standing Expiration Date:   05/13/2023   Comprehensive metabolic panel    Standing Status:   Standing    Number of Occurrences:   33    Standing Expiration Date:   05/13/2023   TSH    Standing Status:   Standing    Number of Occurrences:   22    Standing Expiration Date:   05/13/2023   T4, free    Standing Status:   Standing    Number of Occurrences:   22    Standing Expiration Date:   05/13/2023    All questions were answered. The patient knows to call the clinic with any problems, questions or concerns. The total time spent in the appointment was 40 minutes encounter with patients including review of chart and various tests results, discussions about plan of care and coordination of care plan   [name],  MD 05/12/2022 3:36 PM  INTERVAL HISTORY: Please see below for problem oriented charting. she returns for treatment follow-up and review test results She is healing well from recent surgery She has no complications from surgery such as lymphedema We reviewed surgical report, pathology report and discussed treatment recommendations  REVIEW OF SYSTEMS:   Constitutional: Denies fevers, chills or abnormal weight loss Eyes: Denies blurriness of vision Ears, nose, mouth, throat, and face: Denies mucositis or sore throat Respiratory: Denies cough, dyspnea or wheezes Cardiovascular: Denies palpitation, chest discomfort or lower extremity swelling Gastrointestinal:  Denies nausea, heartburn or change in bowel habits Skin: Denies abnormal skin rashes Lymphatics: Denies new lymphadenopathy or easy bruising Neurological:Denies numbness, tingling or new weaknesses Behavioral/Psych: Mood is stable, no new changes  All other systems were reviewed with the patient and are negative.  I have reviewed the past medical history, past surgical history, social history and family history with the patient and they are unchanged from previous note.  ALLERGIES:  is allergic to sulfa antibiotics and sulfamethoxazole.  MEDICATIONS:  Current Outpatient Medications  Medication Sig Dispense Refill   letrozole (FEMARA) 2.5 MG tablet Take 1 tablet (2.5 mg total) by mouth daily. 30 tablet 3   amLODipine-valsartan (EXFORGE) 5-160 MG tablet Take 1 tablet by mouth daily after breakfast.     Azelastine HCl 0.15 % SOLN Place 1-2 sprays into both nostrils daily as needed (sinus congestion/issues.).     calcium  elemental as carbonate (BARIATRIC TUMS ULTRA) 400 MG chewable tablet Chew 2 tablets by mouth daily in the afternoon.     Cholecalciferol (VITAMIN D3) 125 MCG (5000 UT) TABS Take 5,000 Units by mouth daily after lunch.     cholecalciferol (VITAMIN D3) 25 MCG (1000 UNIT) tablet Take 5 tablets (5,000 Units total) by  mouth daily. (Patient not taking: Reported on 04/13/2022) 30 tablet 2   diphenhydrAMINE (BENADRYL) 25 MG tablet Take 25 mg by mouth daily as needed (runny nose/sinus issues.).     ergocalciferol (VITAMIN D2) 1.25 MG (50000 UT) capsule Take 1 capsule (50,000 Units total) by mouth once a week. (Patient taking differently: Take 50,000 Units by mouth 2 (two) times a week. Wednesdays & Saturdays.) 25 capsule 1   fexofenadine (ALLEGRA) 180 MG tablet Take 180 mg by mouth daily as needed for allergies or rhinitis.     furosemide (LASIX) 20 MG tablet Take 1 tablet (20 mg total) by mouth daily for 7 days. (Patient not taking: Reported on 04/13/2022) 7 tablet 0   glucose blood (ONETOUCH VERIO) test strip Use as instructed to check blood sugars 3 times a day. 100 each 3   levothyroxine (EUTHYROX) 200 MCG tablet Take 1 tablet (200 mcg total) by mouth daily before breakfast. 90 tablet 3   levothyroxine (EUTHYROX) 50 MCG tablet Take 1 tablet (50 mcg total) by mouth daily before breakfast. 90 tablet 3   lidocaine-prilocaine (EMLA) cream Apply topically daily as needed. (Patient taking differently: Apply 1 Application topically daily as needed (prior to port access.).) 30 g 3   OneTouch Delica Lancets 49E MISC 1 each by Other route 2 (two) times daily. Use to monitor glucose levels BID; E11.65 100 each 2   oxyCODONE-acetaminophen (PERCOCET) 5-325 MG tablet Take 1 tablet by mouth every 4 (four) hours as needed for severe pain. 10 tablet 0   RELION PEN NEEDLES 32G X 4 MM MISC USE 1 PEN PER DAY 50 each 5   simvastatin (ZOCOR) 10 MG tablet Take 10 mg by mouth at bedtime.      No current facility-administered medications for this visit.    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 Leo Grosser who obtained an endometrial biopsy showing poorly differentiated endometrial carcinoma and negative endocervical curettage   12/12/2017 Imaging   MAMMOGRAM FINDINGS: In the right axilla, a possible mass warrants further evaluation. In the left breast, no findings suspicious for malignancy.   Images were processed with CAD.   IMPRESSION: Further evaluation is suggested for possible mass in the right axilla.     12/24/2017 Imaging   Ct scan chest, abdomen and pelvis 1. Marked thickening of the endometrium (42 mm) compatible with known primary endometrial malignancy. No evidence of extrauterine invasion. 2. No pelvic or retroperitoneal adenopathy. 3. Right middle lobe 4 mm solid pulmonary nodule, for which follow-up chest CT is advised in 3-6 months. 4. Several findings that are equivocal for distant metastatic disease. Vaguely nodular heterogeneous hyperenhancement in the peripheral right liver lobe, which could represent benign transient perfusional phenomena, with underlying  liver lesions not entirely excluded. Mildly sclerotic T12 vertebral lesion. Mildly enlarged right axillary lymph node. The best single test to further evaluate these findings would be a PET-CT. Alternative tests include bone scan or thoracic MRI without and with IV contrast for the T12 osseous lesion, MRI abdomen without and with IV contrast for the liver findings, and diagnostic mammographic evaluation for the right axillary node. 5.  Aortic Atherosclerosis (ICD10-I70.0).     01/09/2018 PET scan   1. Moderate hypermetabolism corresponding to enlarging axillary node since 12/22/2017. Highly suspicious for an atypical distribution of metastatic disease. 2. No hypermetabolism to suggest hepatic or T12 osseous  metastasis. 3. Hypermetabolic endometrial primary.   01/23/2018 Initial Diagnosis   Endometrial cancer ([place])   01/23/2018 Pathology Results   1. Lymph node, sentinel, biopsy, left external iliac - NO CARCINOMA IDENTIFIED IN ONE LYMPH NODE (0/1) - SEE COMMENT 2. Lymph nodes, regional resection, right para aortic - NO CARCINOMA IDENTIFIED IN FOUR LYMPH NODES (0/4) - SEE COMMENT 3. Lymph nodes, regional resection, right pelvic - NO CARCINOMA IDENTIFIED IN EIGHT LYMPH NODES (0/8) - SEE COMMENT 4. Cul-de-sac biopsy - METASTATIC CARCINOMA 5. Uterus +/- tubes/ovaries, neoplastic, cervix, bilateral fallopian tubes and ovaries UTERUS: - MIXED SEROUS AND ENDOMETRIOID CARCINOMA - SEROSAL IMPLANTS PRESENT - LYMPHOVASCULAR SPACE INVASION PRESENT - LEIOMYOMATA (1.5 CM; LARGEST) - SEE ONCOLOGY TABLE AND COMMENT BELOW CERVIX: - BENIGN NABOTHIAN CYSTS - NO CARCINOMA IDENTIFIED BILATERAL OVARIES: - METASTATIC CARCINOMA PRESENT ON OVARIAN SURFACE BILATERAL FALLOPIAN TUBES: - INTRALUMINAL CARCINOMA Microscopic Comment 1. -3. Cytokeratin AE1/3 was performed on the sentinel lymph nodes to exclude micrometastasis. There is no evidence of metastatic carcinoma by immunohistochemistry. 5. 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%) Histologic Grade: N/A Myometrial invasion: Estimated less than 50% myometrial invasion (0.3 cm of myometrium involved; 1.4 cm measured thickness) Uterine Serosa Involvement: Present Cervical stromal involvement: Not identified Extent of involvement of other organs: - Fallopian tube (left within the lumen) - Ovary, left (surface involvement) - Cul-de-sac Lymphovascular invasion: Present Regional Lymph Nodes: Examined: 1 Sentinel 12  Non-sentinel 13 Total Lymph nodes with metastasis: 0 Isolated tumor cells (< 0.2 mm): 0 Micrometastasis: (> 0.2 mm and < 2.0 mm): 0 Macrometastasis: (> 2.0 mm): 0 Extracapsular extension: N/A Tumor block for ancillary studies: 5E, 5B MMR / MSI testing: Pending will be reported separately Pathologic Stage Classification (pTNM, AJCC 8th edition): pT3a, pN0 FIGO Stage: IIIA COMMENT: There is tumor present on the surface of the left ovary and within the lumen of the left fallopian tube. The carcinoma appears to be mixed with the largest component being high grade serous carcinoma. Dr. Lyndon Code reviewed the case and agrees with the above diagnosis.   01/23/2018 Surgery   Surgeon: Donaciano Eva     Operation: Robotic-assisted laparoscopic total hysterectomy with bilateral salpingoophorectomy, SLN injection, mapping and biopsy, right pelvic and para-aortic lymphadenectomy   Operative Findings:  : 10-12cm bulky uterus with frank serosal involvement on posterior cul de sac peritoneum and anterior peritoneum which adhesed the bladder to the anterior uterus. Unilateral mapping on left pelvis. No grossly suspicious nodes. Normal omentum and diaphragms.    02/01/2018 Pathology Results   Lymph node, needle/core biopsy, right axilla - METASTATIC CARCINOMA - SEE COMMENT Microscopic Comment The neoplastic cells are positive for cytokeratin 7 and Pax-8 but negative for cytokeratin 5/6, cytokeratin 20,  Gata-3, p63, p53 and GCDFP. Overall, the immunoprofile is consistent with metastasis from the patient's known gynecologic carcinoma.   02/01/2018 Procedure   Ultrasound-guided core biopsies of a suspicious right axillary lymph node.   02/21/2018 - 06/13/2018 Chemotherapy   The patient had carboplatin & Taxol x 6   03/26/2018 - 04/30/2018 Radiation Therapy   Radiation treatment dates:   03/26/18, 04/02/18, 04/19/18, 04/23/18 04/30/18   Site/dose: proximal Vagina, 6 Gy in 5 fractions for a total dose of 30 Gy      04/26/2018 PET scan   1. Interval resection of the hypermetabolic endometrial primary. No discernible hypermetabolic pelvic sidewall or peritoneal metastases. 2. Stable hypermetabolic bilateral axillary lymphadenopathy. The degree of hypermetabolism in these lymph nodes remains highly suspicious for neoplasm. 3. Interval development of low level FDG uptake in 2 stable, small lymph nodes in the left groin region. This may be reactive, but close attention recommended to exclude metastatic involvement. 4. Stable non hypermetabolic low-density liver lesions and the mixed lucent and sclerotic T12 lesion is stable without hypermetabolism today.   07/09/2018 Imaging   Status post hysterectomy.   Mild axillary lymphadenopathy, improved. Nodal metastases not excluded.   Irregular wall thickening involving the anterior bladder, possibly reflecting radiation cystitis versus tumor.   Additional stable findings as above, including a 5 mm right middle lobe nodule and stable sclerosis involving the T12 vertebral body.   10/10/2018 Imaging   1. Stable exam. No findings within the abdomen or pelvis to suggest metastatic disease. 2. Unchanged appearance of sclerosis involving the T12 vertebra. 3.  Aortic Atherosclerosis (ICD10-I70.0).   10/10/2018 Imaging   Ct abdomen and pelvis 1. Stable exam. No findings within the abdomen or pelvis to suggest metastatic disease. 2. Unchanged appearance of sclerosis involving the T12 vertebra. 3.  Aortic Atherosclerosis (ICD10-I70.0).   01/16/2019 PET scan   1. Enlargement and increased activity in the left axillary, right axillary, and left inguinal adenopathy compatible with progressive malignancy. 2. Stable 4 mm right middle lobe subpleural nodule, not appreciably hypermetabolic. 3. Other imaging findings of potential clinical significance: Right kidney lower pole cyst. Aortic Atherosclerosis (ICD10-I70.0). Prominent stool throughout the colon favors constipation.  Mild chronic left maxillary sinusitis.     02/01/2019 -  Chemotherapy   The patient had pembrolizumab and Lenvima for chemotherapy treatment.     04/25/2019 Imaging   Ct imaging 1. Interval response to therapy. Decrease in size of bilateral axillary and left inguinal lymph nodes. No new or progressive findings identified. 2. Stable sclerotic appearance of the T12 vertebra. 3. Aortic atherosclerosis. Lad coronary artery calcification noted.   08/30/2019 Imaging   1. Bulky RIGHT hilar lymph node, slightly increased in size from previous imaging. 2. Multiple foci of hyperenhancement in the liver. 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 Consider MRI liver on follow-up to establish a baseline for number of lesions as these will appear variable on CT follow-up based on phase of contrast acquisition in the future. 3. Stable 5 mm nodule adjacent to the fissure, minor fissure in the RIGHT middle lobe.     11/28/2019 Imaging   1. Stable exam. No new or progressive findings. 2. Stable enlarged right axillary lymph node. 3. Stable tiny bilateral pulmonary nodules. Continued attention on follow-up recommended. 4. Scattered hypodensities in the  liver parenchyma correspond to the hypervascular lesions seen on the previous study performed with intravenous contrast material. 5. Right renal cyst. 6. Aortic Atherosclerosis (ICD10-I70.0).     03/16/2020 Imaging   Increased echogenicity of renal parenchyma is noted bilaterally suggesting medical renal disease. No hydronephrosis or renal obstruction is noted   04/02/2020 Imaging   1. Right axillary lymphadenopathy is stable. Tiny bilateral pulmonary nodules are stable. Subcentimeter low-attenuation liver lesions are stable. 2. No new or progressive metastatic  disease in the chest, abdomen or pelvis. 3. Aortic Atherosclerosis (ICD10-I70.0).   09/15/2020 Imaging   1. Stable RIGHT axillary lymph node with enlargement measuring 2 cm short axis. 2. Stable small nodule along the minor fissure in the RIGHT chest. 3. No new or progressive findings. 4. Aortic atherosclerosis.   03/24/2021 Imaging   1. Right axillary lymphadenopathy is stable compared to prior studies. 2. No other definite signs of metastatic disease elsewhere in the thorax. 3. Hepatic steatosis. Numerous hypervascular areas noted in the liver, incompletely characterized on today's arterial phase examination. These are similar to remote prior examination from 08/30/2019, likely to represent small benign lesions such as flash fill cavernous hemangiomas. These could be definitively evaluated with nonemergent abdominal MRI with and without IV gadolinium if of clinical concern. 4. Aortic atherosclerosis, in addition to left main and left anterior descending coronary artery disease. Assessment for potential risk factor modification, dietary therapy or pharmacologic therapy may be warranted, if clinically indicated.   Aortic Atherosclerosis (ICD10-I70.0).   09/16/2021 Imaging   1. Unchanged enlarged right axillary lymph node. No evidence of new lymphadenopathy or metastatic disease in the chest, abdomen, or pelvis. 2. Subcentimeter hyperenhancing lesions of the right lobe of the liver, hepatic segments VII and VIII are unchanged, measuring up to 0.7 cm. These are likely small benign incidental flash filling hemangiomata although incompletely characterized on this examination. Attention on follow-up. 3. Status post hysterectomy. 4. Cholelithiasis. 5. Coronary artery disease.   Aortic Atherosclerosis (ICD10-I70.0).     12/31/2021 - 03/04/2022 Chemotherapy   Patient is on Treatment Plan : UTERINE Lenvatinib (20) D1-21 + Pembrolizumab (200) D1 q21d     03/24/2022 Imaging   1. Similar to mild  enlargement of an isolated right axillary nodal mass. 2. No new sites of disease identified. 3.  Possible constipation. 4. Coronary artery atherosclerosis. Aortic Atherosclerosis (ICD10-I70.0).   04/22/2022 Pathology Results   A. AXILLARY, RIGHT, LYMPH NODE:  -  1 of 3 lymph nodes positive for metastatic carcinoma, consistent with  the patient's known endometrial carcinoma (i.e. morphologically consistent with papillary serous carcinoma) with areas of geographic necrosis, negative for extracapsular extension on sections examined.    04/22/2022 Surgery   Preoperative Diagnosis: RIGHT AXILLARY LYMPHADENOPATHY    Postoperative Diagnosis: RIGHT AXILLARY LYMPHADENOPATHY    Surgical Procedure: RIGHT AXILLARY LYMPH NODE BIOPSY:     Operative Team Members:  Surgeon(s) and Role:    * Stechschulte, Nickola Major, MD - Primary     Specimen:  ID Type Source Tests Collected by Time Destination  1 : Right Axillary Lymph Node Tissue PATH Lymph nodes regional SURGICAL PATHOLOGY Stechschulte, Nickola Major, MD 04/22/2022 1040         Indications for Procedure: Krista Johnson is a 72 y.o. female who presented with right axillary lymphadenopathy. I recommended right axillary lymph node biopsy. The procedure itself as well as its risk, benefits, and alternatives were discussed the patient in full. After full discussion all questions answered the patient granted consent to proceed.  Findings: Firm enlarged right axillary lymph node    Description of Procedure:    On the date stated above the patient was taken operating room placed in supine position.  Anesthesia was induced.  A timeout was completed verifying correct patient, procedure, positioning, and equipment needed for the case.  The patient's chest and right axilla was prepped and draped in the usual sterile fashion.   I made a incision along the hairline anterior to the axilla and dissected through the subcutaneous tissues into the axilla.  Open the axillary  fascia and palpated for the lymph node.  The lymph node was dissected circumferentially.  The blood supply of the lymph node was controlled using two 2-0 Vicryl sutures.  Was passed off the field as a specimen.  The wound was irrigated and hemostasis was obtained.  The wound was closed in layers with 2-0 Vicryl suture running the deeper layers together and 4-0 Monocryl and Dermabond for the skin.  All sponge needle counts were correct at the end of the case.     PHYSICAL EXAMINATION: ECOG PERFORMANCE STATUS: 1 - Symptomatic but completely ambulatory  Vitals:   05/12/22 0805  BP: 124/69  Pulse: 75  Resp: 18  Temp: (!) 97.4 F (36.3 C)  SpO2: 99%   Filed Weights   05/12/22 0805  Weight: 151 lb (68.5 kg)    GENERAL:alert, no distress and comfortable SKIN: skin color, texture, turgor are normal, no rashes or significant lesions EYES: normal, Conjunctiva are pink and non-injected, sclera clear OROPHARYNX:no exudate, no erythema and lips, buccal mucosa, and tongue normal  NECK: supple, thyroid normal size, non-tender, without nodularity LYMPH: Noted well-healed surgical scar NEURO: alert & oriented x 3 with fluent speech, no focal motor/sensory deficits  LABORATORY DATA:  I have reviewed the data as listed    Component Value Date/Time   NA 144 04/05/2022 1420   K 3.7 04/05/2022 1420   CL 114 (H) 04/05/2022 1420   CO2 24 04/05/2022 1420   GLUCOSE 173 (H) 04/05/2022 1420   BUN 23 04/05/2022 1420   CREATININE 1.05 (H) 04/05/2022 1420   CALCIUM 9.2 04/05/2022 1420   PROT 5.8 (L) 04/05/2022 1420   ALBUMIN 3.2 (L) 04/05/2022 1420   AST 23 04/05/2022 1420   ALT 26 04/05/2022 1420   ALKPHOS 75 04/05/2022 1420   BILITOT 0.5 04/05/2022 1420   GFRNONAA 57 (L) 04/05/2022 1420   GFRAA 34 (L) 01/10/2020 1200    No results found for: "SPEP", "UPEP"  Lab Results  Component Value Date   WBC 4.7 04/05/2022   NEUTROABS 3.0 04/05/2022   HGB 12.4 04/05/2022   HCT 38.7 04/05/2022   MCV  91.3 04/05/2022   PLT 163 04/05/2022      Chemistry      Component Value Date/Time   NA 144 04/05/2022 1420   K 3.7 04/05/2022 1420   CL 114 (H) 04/05/2022 1420   CO2 24 04/05/2022 1420   BUN 23 04/05/2022 1420   CREATININE 1.05 (H) 04/05/2022 1420      Component Value Date/Time   CALCIUM 9.2 04/05/2022 1420   ALKPHOS 75 04/05/2022 1420   AST 23 04/05/2022 1420   ALT 26 04/05/2022 1420   BILITOT 0.5 04/05/2022 1420

## 2022-05-13 ENCOUNTER — Telehealth: Payer: Self-pay

## 2022-05-13 ENCOUNTER — Ambulatory Visit: Payer: Medicare Other | Admitting: Hematology and Oncology

## 2022-05-13 ENCOUNTER — Other Ambulatory Visit: Payer: Medicare Other

## 2022-05-13 NOTE — Telephone Encounter (Signed)
Returned her call. She wanted to clarify when to start Letrozole. Told her per Dr. Alvy Bimler note to start 2/1. She verbalized understanding.

## 2022-06-08 ENCOUNTER — Ambulatory Visit (INDEPENDENT_AMBULATORY_CARE_PROVIDER_SITE_OTHER): Payer: Medicare Other | Admitting: Podiatry

## 2022-06-08 VITALS — BP 140/76

## 2022-06-08 DIAGNOSIS — M79674 Pain in right toe(s): Secondary | ICD-10-CM

## 2022-06-08 DIAGNOSIS — N183 Chronic kidney disease, stage 3 unspecified: Secondary | ICD-10-CM

## 2022-06-08 DIAGNOSIS — R609 Edema, unspecified: Secondary | ICD-10-CM | POA: Insufficient documentation

## 2022-06-08 DIAGNOSIS — B351 Tinea unguium: Secondary | ICD-10-CM | POA: Diagnosis not present

## 2022-06-08 DIAGNOSIS — M79675 Pain in left toe(s): Secondary | ICD-10-CM | POA: Diagnosis not present

## 2022-06-08 DIAGNOSIS — Q828 Other specified congenital malformations of skin: Secondary | ICD-10-CM

## 2022-06-08 DIAGNOSIS — L84 Corns and callosities: Secondary | ICD-10-CM

## 2022-06-08 DIAGNOSIS — E0822 Diabetes mellitus due to underlying condition with diabetic chronic kidney disease: Secondary | ICD-10-CM | POA: Diagnosis not present

## 2022-06-08 DIAGNOSIS — G620 Drug-induced polyneuropathy: Secondary | ICD-10-CM

## 2022-06-08 DIAGNOSIS — Z794 Long term (current) use of insulin: Secondary | ICD-10-CM

## 2022-06-08 NOTE — Progress Notes (Signed)
  Subjective:  Patient ID: Krista Johnson, female    DOB: 12/15/1950,  MRN: [id number]  Krista Johnson presents to clinic today for at risk foot care. Pt has h/o NIDDM with chronic kidney disease and callus(es) b/l lower extremities, porokeratotic lesion(s) b/l lower extremities, and painful mycotic nails. Painful toenails interfere with ambulation. Aggravating factors include wearing enclosed shoe gear. Pain is relieved with periodic professional debridement. Painful callus(es) and porokeratotic lesion(s) are aggravated when weightbearing with and without shoegear. Pain is relieved with periodic professional debridement.  Chief Complaint  Patient presents with   Nail Problem    [facility] BS-84 A1C-6.5 PCP-Wolters, Takai PCP VST-04/2022   New problem(s): None.   PCP is Jonathon Jordan, MD.  Allergies  Allergen Reactions   Sulfa Antibiotics Nausea Only   Sulfamethoxazole Nausea Only    Review of Systems: Negative except as noted in the HPI.  Objective: No changes noted in today's physical examination. Vitals:   06/08/22 1001  BP: (!) 140/76   Krista Johnson is a pleasant 72 y.o. female WD, WN in NAD. AAO x 3.  Vascular Examination: Capillary refill time immediate b/l. Vascular status intact b/l with palpable pedal pulses. Pedal hair present b/l. No edema. No pain with calf compression b/l. Skin temperature gradient WNL b/l. No cyanosis or clubbing noted b/l LE.  Neurological Examination: Sensation grossly intact b/l with 10 gram monofilament. Vibratory sensation diminished b/l.  Dermatological Examination: Pedal skin with normal turgor, texture and tone b/l. Toenails 1-5 b/l thick, discolored, elongated with subungual debris and pain on dorsal palpation.   Hyperkeratotic lesion(s) plantar heel pad of both feet, plantar IPJ of left great toe, plantar IPJ of right great toe, and 5th met base right lower extremity.  No erythema, no edema, no drainage, no fluctuance. Porokeratotic  lesion(s) dorsal PIPJ of L 5th toe and submet head 4 b/l. No erythema, no edema, no drainage, no fluctuance.  Musculoskeletal Examination: Normal muscle strength 5/5 to all lower extremity muscle groups bilaterally. Hammertoe deformity noted 2-5 b/l.Marland Kitchen No pain, crepitus or joint limitation noted with ROM b/l LE.  Patient ambulates independently without assistive aids.  Radiographs: None  Assessment/Plan: 1. Pain due to onychomycosis of toenails of both feet   2. Porokeratosis   3. Callus   4. Chemotherapy-induced neuropathy ([place])   5. Diabetes mellitus due to underlying condition with stage 3 chronic kidney disease, with long-term current use of insulin, unspecified whether stage 3a or 3b CKD ([place])     -Patient was evaluated and treated. All patient's and/or POA's questions/concerns answered on today's visit. -Continue supportive shoe gear daily. -Mycotic toenails 1-5 bilaterally were debrided in length and girth with sterile nail nippers and dremel without incident. -Callus(es) left great toe, right great toe, and 5th met base right foot pared utilizing sterile scalpel blade without complication or incident. Total number debrided =3. -Porokeratotic lesion(s) submet head 2 left foot and submet head 4 right foot pared and enucleated with sterile currette without incident. Total number of lesions debrided=2. -Patient/POA to call should there be question/concern in the interim.   Return in about 3 months (around 09/06/2022).  Marzetta Board, DPM

## 2022-06-12 ENCOUNTER — Encounter: Payer: Self-pay | Admitting: Podiatry

## 2022-06-17 ENCOUNTER — Other Ambulatory Visit: Payer: Self-pay

## 2022-06-17 ENCOUNTER — Encounter: Payer: Self-pay | Admitting: Hematology and Oncology

## 2022-06-17 ENCOUNTER — Inpatient Hospital Stay: Payer: Medicare Other | Attending: Gynecology | Admitting: Hematology and Oncology

## 2022-06-17 VITALS — BP 143/67 | HR 74 | Temp 97.9°F | Resp 18 | Ht 66.0 in | Wt 159.2 lb

## 2022-06-17 DIAGNOSIS — Z79811 Long term (current) use of aromatase inhibitors: Secondary | ICD-10-CM | POA: Diagnosis not present

## 2022-06-17 DIAGNOSIS — C541 Malignant neoplasm of endometrium: Secondary | ICD-10-CM

## 2022-06-17 DIAGNOSIS — R918 Other nonspecific abnormal finding of lung field: Secondary | ICD-10-CM

## 2022-06-17 DIAGNOSIS — C773 Secondary and unspecified malignant neoplasm of axilla and upper limb lymph nodes: Secondary | ICD-10-CM | POA: Diagnosis not present

## 2022-06-17 DIAGNOSIS — Z170 Estrogen receptor positive status [ER+]: Secondary | ICD-10-CM | POA: Diagnosis not present

## 2022-06-17 MED ORDER — LETROZOLE 2.5 MG PO TABS: 2.5000 mg | ORAL_TABLET | Freq: Every day | ORAL | 3 refills | Status: DC

## 2022-06-17 NOTE — Assessment & Plan Note (Signed)
She denies new lymphadenopathy We will order imaging study to monitor

## 2022-06-17 NOTE — Assessment & Plan Note (Signed)
Overall, she tolerated treatment very well without side effects I plan to order CT imaging next month for further follow-up

## 2022-06-17 NOTE — Progress Notes (Signed)
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    Answer:   [facility]    Order Specific Question:   Radiology Contrast Protocol - do NOT remove file path    Answer:   [email or web address]    All questions were answered. The patient knows to call the clinic with any problems, questions or concerns. The total time spent in the appointment was 25 minutes encounter with patients including review of chart and various tests results, discussions about plan of care and coordination of care plan   [name], MD 06/17/2022 9:20 AM  INTERVAL HISTORY: Please see below for problem oriented charting. she returns for treatment follow-up on letrozole as antiestrogen therapy for recurrent ER positive uterine cancer The surgical scar is healing well She denies new lymphadenopathy She has minimum hot flashes  REVIEW OF SYSTEMS:   Constitutional: Denies fevers, chills or abnormal weight loss Eyes: Denies blurriness of vision Ears, nose, mouth, throat, and face: Denies mucositis or sore throat Respiratory: Denies cough, dyspnea or wheezes Cardiovascular: Denies palpitation, chest discomfort or lower extremity swelling Gastrointestinal:  Denies nausea, heartburn or change in bowel habits Skin: Denies abnormal skin rashes Lymphatics: Denies new  lymphadenopathy or easy bruising Neurological:Denies numbness, tingling or new weaknesses Behavioral/Psych: Mood is stable, no new changes  All other systems were reviewed with the patient and are negative.  I have reviewed the past medical history, past surgical history, social history and family history with the patient and they are unchanged from previous note.  ALLERGIES:  is allergic to sulfa antibiotics and sulfamethoxazole.  MEDICATIONS:  Current Outpatient Medications  Medication Sig Dispense Refill   amLODipine-valsartan (EXFORGE) 5-160 MG tablet Take 1 tablet by mouth daily after breakfast.     Azelastine HCl 0.15 % SOLN Place 1-2 sprays into both nostrils daily as needed (sinus congestion/issues.).     calcium elemental as carbonate (BARIATRIC TUMS ULTRA) 400 MG chewable tablet Chew 2 tablets by mouth daily in the afternoon.     cholecalciferol (VITAMIN D3) 25 MCG (1000 UNIT) tablet Take 5 tablets (5,000 Units total) by mouth daily. (Patient not taking: Reported on 04/13/2022) 30 tablet 2   diphenhydrAMINE (BENADRYL) 25 MG tablet Take 25 mg by mouth daily as needed (runny nose/sinus issues.).     ergocalciferol (VITAMIN D2) 1.25 MG (50000 UT) capsule Take 1 capsule (50,000 Units total) by mouth once a week. (Patient taking differently: Take 50,000 Units by mouth 2 (two) times a week. Wednesdays & Saturdays.) 25 capsule 1   fexofenadine (ALLEGRA) 180 MG tablet Take 180 mg by mouth daily as needed for allergies or rhinitis.     glucose blood (ONETOUCH VERIO) test strip Use as instructed to check blood sugars 3 times a day. 100 each 3   letrozole (FEMARA) 2.5 MG tablet Take 1 tablet (2.5 mg total) by  mouth daily. 90 tablet 3   levothyroxine (EUTHYROX) 200 MCG tablet Take 1 tablet (200 mcg total) by mouth daily before breakfast. 90 tablet 3   levothyroxine (EUTHYROX) 50 MCG tablet Take 1 tablet (50 mcg total) by mouth daily before breakfast. 90 tablet 3   lidocaine-prilocaine (EMLA)  cream Apply topically daily as needed. (Patient taking differently: Apply 1 Application topically daily as needed (prior to port access.).) 30 g 3   OneTouch Delica Lancets 99991111 MISC 1 each by Other route 2 (two) times daily. Use to monitor glucose levels BID; E11.65 100 each 2   RELION PEN NEEDLES 32G X 4 MM MISC USE 1 PEN PER DAY 50 each 5   simvastatin (ZOCOR) 10 MG tablet Take 10 mg by mouth at bedtime.      No current facility-administered medications for this visit.    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 Leo Grosser who obtained an endometrial biopsy showing poorly differentiated endometrial carcinoma and negative endocervical curettage   12/12/2017 Imaging   MAMMOGRAM FINDINGS: In the right axilla, a possible mass warrants further evaluation. In the left breast, no findings suspicious for malignancy.   Images were processed with CAD.   IMPRESSION: Further evaluation is suggested for possible mass in the right axilla.     12/24/2017 Imaging   Ct scan chest, abdomen and pelvis 1. Marked thickening of the endometrium (42 mm) compatible with known primary endometrial malignancy. No evidence of extrauterine invasion. 2. No pelvic or retroperitoneal adenopathy. 3. Right middle lobe 4 mm solid pulmonary nodule, for which follow-up chest CT is advised in 3-6 months. 4. Several findings that are equivocal for distant metastatic disease. Vaguely nodular heterogeneous hyperenhancement in the peripheral right liver lobe, which could  represent benign transient perfusional phenomena, with underlying liver lesions not entirely excluded. Mildly sclerotic T12 vertebral lesion. Mildly enlarged right axillary lymph node. The best single test to further evaluate these findings would be a PET-CT. Alternative tests include bone scan or thoracic MRI without and with IV contrast for the T12 osseous lesion, MRI abdomen without and with IV contrast for the liver findings, and diagnostic mammographic evaluation for the right axillary node. 5.  Aortic Atherosclerosis (ICD10-I70.0).     01/09/2018 PET scan   1. Moderate hypermetabolism corresponding to enlarging axillary node since 12/22/2017. Highly suspicious for an atypical distribution of metastatic disease. 2. No hypermetabolism to suggest hepatic or T12 osseous metastasis. 3. Hypermetabolic endometrial primary.   01/23/2018 Initial Diagnosis   Endometrial cancer ([place])   01/23/2018 Pathology Results   1. Lymph node, sentinel, biopsy, left external iliac - NO CARCINOMA IDENTIFIED IN ONE LYMPH NODE (0/1) - SEE COMMENT 2. Lymph nodes, regional resection, right para aortic - NO CARCINOMA IDENTIFIED IN FOUR LYMPH NODES (0/4) - SEE COMMENT 3. Lymph nodes, regional resection, right pelvic - NO CARCINOMA IDENTIFIED IN EIGHT LYMPH NODES (0/8) - SEE COMMENT 4. Cul-de-sac biopsy - METASTATIC CARCINOMA 5. Uterus +/- tubes/ovaries, neoplastic, cervix, bilateral fallopian tubes and ovaries UTERUS: - MIXED SEROUS AND ENDOMETRIOID CARCINOMA - SEROSAL  IMPLANTS PRESENT - LYMPHOVASCULAR SPACE INVASION PRESENT - LEIOMYOMATA (1.5 CM; LARGEST) - SEE ONCOLOGY TABLE AND COMMENT BELOW CERVIX: - BENIGN NABOTHIAN CYSTS - NO CARCINOMA IDENTIFIED BILATERAL OVARIES: - METASTATIC CARCINOMA PRESENT ON OVARIAN SURFACE BILATERAL FALLOPIAN TUBES: - INTRALUMINAL CARCINOMA Microscopic Comment 1. -3. Cytokeratin AE1/3 was performed on the sentinel lymph nodes to exclude micrometastasis. There is no evidence  of metastatic carcinoma by immunohistochemistry. 5. 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%) Histologic Grade: N/A Myometrial invasion: Estimated less than 50% myometrial invasion (0.3 cm of myometrium involved; 1.4 cm measured thickness) Uterine Serosa Involvement: Present Cervical stromal involvement: Not identified Extent of involvement of other organs: - Fallopian tube (left within the lumen) - Ovary, left (surface involvement) - Cul-de-sac Lymphovascular invasion: Present Regional Lymph Nodes: Examined: 1 Sentinel 12 Non-sentinel 13 Total Lymph nodes with metastasis: 0 Isolated tumor cells (< 0.2 mm): 0 Micrometastasis: (> 0.2 mm and < 2.0 mm): 0 Macrometastasis: (> 2.0 mm): 0 Extracapsular extension: N/A Tumor block for ancillary studies: 5E, 5B MMR / MSI testing: Pending will be reported separately Pathologic Stage Classification (pTNM, AJCC 8th edition): pT3a, pN0 FIGO Stage: IIIA COMMENT: There is tumor present on the surface of the left ovary and within the lumen of the left fallopian tube. The carcinoma appears to be mixed with the largest component being high grade serous carcinoma. Dr. Lyndon Code reviewed the case and agrees with the above diagnosis.   01/23/2018 Surgery   Surgeon: Donaciano Eva     Operation: Robotic-assisted laparoscopic total hysterectomy with bilateral salpingoophorectomy, SLN injection, mapping and biopsy, right pelvic and para-aortic lymphadenectomy   Operative Findings:  : 10-12cm bulky uterus with frank serosal involvement on posterior cul de sac peritoneum and anterior peritoneum which adhesed the bladder to the anterior uterus. Unilateral mapping on left pelvis. No grossly suspicious nodes. Normal omentum and  diaphragms.    02/01/2018 Pathology Results   Lymph node, needle/core biopsy, right axilla - METASTATIC CARCINOMA - SEE COMMENT Microscopic Comment The neoplastic cells are positive for cytokeratin 7 and Pax-8 but negative for cytokeratin 5/6, cytokeratin 20, Gata-3, p63, p53 and GCDFP. Overall, the immunoprofile is consistent with metastasis from the patient's known gynecologic carcinoma.   02/01/2018 Procedure   Ultrasound-guided core biopsies of a suspicious right axillary lymph node.   02/21/2018 - 06/13/2018 Chemotherapy   The patient had carboplatin & Taxol x 6   03/26/2018 - 04/30/2018 Radiation Therapy   Radiation treatment dates:   03/26/18, 04/02/18, 04/19/18, 04/23/18 04/30/18   Site/dose: proximal Vagina, 6 Gy in 5 fractions for a total dose of 30 Gy     04/26/2018 PET scan   1. Interval resection of the hypermetabolic endometrial primary. No discernible hypermetabolic pelvic sidewall or peritoneal metastases. 2. Stable hypermetabolic bilateral axillary lymphadenopathy. The degree of hypermetabolism in these lymph nodes remains highly suspicious for neoplasm. 3. Interval development of low level FDG uptake in 2 stable, small lymph nodes in the left groin region. This may be reactive, but close attention recommended to exclude metastatic involvement. 4. Stable non hypermetabolic low-density liver lesions and the mixed lucent and sclerotic T12 lesion is stable without hypermetabolism today.   07/09/2018 Imaging   Status post hysterectomy.   Mild axillary lymphadenopathy, improved. Nodal metastases not excluded.   Irregular wall thickening involving the anterior bladder, possibly reflecting radiation cystitis versus tumor.  Additional stable findings as above, including a 5 mm right middle lobe nodule and stable sclerosis involving the T12 vertebral body.   10/10/2018 Imaging   1. Stable exam. No findings within the abdomen or pelvis to suggest metastatic disease. 2. Unchanged  appearance of sclerosis involving the T12 vertebra. 3.  Aortic Atherosclerosis (ICD10-I70.0).   10/10/2018 Imaging   Ct abdomen and pelvis 1. Stable exam. No findings within the abdomen or pelvis to suggest metastatic disease. 2. Unchanged appearance of sclerosis involving the T12 vertebra. 3.  Aortic Atherosclerosis (ICD10-I70.0).   01/16/2019 PET scan   1. Enlargement and increased activity in the left axillary, right axillary, and left inguinal adenopathy compatible with progressive malignancy. 2. Stable 4 mm right middle lobe subpleural nodule, not appreciably hypermetabolic. 3. Other imaging findings of potential clinical significance: Right kidney lower pole cyst. Aortic Atherosclerosis (ICD10-I70.0). Prominent stool throughout the colon favors constipation. Mild chronic left maxillary sinusitis.     02/01/2019 -  Chemotherapy   The patient had pembrolizumab and Lenvima for chemotherapy treatment.     04/25/2019 Imaging   Ct imaging 1. Interval response to therapy. Decrease in size of bilateral axillary and left inguinal lymph nodes. No new or progressive findings identified. 2. Stable sclerotic appearance of the T12 vertebra. 3. Aortic atherosclerosis. Lad coronary artery calcification noted.   08/30/2019 Imaging   1. Bulky RIGHT hilar lymph node, slightly increased in size from previous imaging. 2. Multiple foci of hyperenhancement in the liver. 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 Consider MRI liver on follow-up to establish a baseline for number of lesions as these will appear variable on CT follow-up based on phase of contrast acquisition in the future. 3. Stable 5 mm nodule adjacent to the fissure, minor fissure in the RIGHT middle lobe.     11/28/2019 Imaging   1. Stable  exam. No new or progressive findings. 2. Stable enlarged right axillary lymph node. 3. Stable tiny bilateral pulmonary nodules. Continued attention on follow-up recommended. 4. Scattered hypodensities in the liver parenchyma correspond to the hypervascular lesions seen on the previous study performed with intravenous contrast material. 5. Right renal cyst. 6. Aortic Atherosclerosis (ICD10-I70.0).     03/16/2020 Imaging   Increased echogenicity of renal parenchyma is noted bilaterally suggesting medical renal disease. No hydronephrosis or renal obstruction is noted   04/02/2020 Imaging   1. Right axillary lymphadenopathy is stable. Tiny bilateral pulmonary nodules are stable. Subcentimeter low-attenuation liver lesions are stable. 2. No new or progressive metastatic disease in the chest, abdomen or pelvis. 3. Aortic Atherosclerosis (ICD10-I70.0).   09/15/2020 Imaging   1. Stable RIGHT axillary lymph node with enlargement measuring 2 cm short axis. 2. Stable small nodule along the minor fissure in the RIGHT chest. 3. No new or progressive findings. 4. Aortic atherosclerosis.   03/24/2021 Imaging   1. Right axillary lymphadenopathy is stable compared to prior studies. 2. No other definite signs of metastatic disease elsewhere in the thorax. 3. Hepatic steatosis. Numerous hypervascular areas noted in the liver, incompletely characterized on today's arterial phase examination. These are similar to remote prior examination from 08/30/2019, likely to represent small benign lesions such as flash fill cavernous hemangiomas. These could be definitively evaluated with nonemergent abdominal MRI with and without IV gadolinium if of clinical concern.  4. Aortic atherosclerosis, in addition to left main and left anterior descending coronary artery disease. Assessment for potential risk factor modification, dietary therapy or pharmacologic therapy may be warranted, if clinically indicated.   Aortic  Atherosclerosis (ICD10-I70.0).   09/16/2021 Imaging   1. Unchanged enlarged right axillary lymph node. No evidence of new lymphadenopathy or metastatic disease in the chest, abdomen, or pelvis. 2. Subcentimeter hyperenhancing lesions of the right lobe of the liver, hepatic segments VII and VIII are unchanged, measuring up to 0.7 cm. These are likely small benign incidental flash filling hemangiomata although incompletely characterized on this examination. Attention on follow-up. 3. Status post hysterectomy. 4. Cholelithiasis. 5. Coronary artery disease.   Aortic Atherosclerosis (ICD10-I70.0).     12/31/2021 - 03/04/2022 Chemotherapy   Patient is on Treatment Plan : UTERINE Lenvatinib (20) D1-21 + Pembrolizumab (200) D1 q21d     03/24/2022 Imaging   1. Similar to mild enlargement of an isolated right axillary nodal mass. 2. No new sites of disease identified. 3.  Possible constipation. 4. Coronary artery atherosclerosis. Aortic Atherosclerosis (ICD10-I70.0).   04/22/2022 Pathology Results   A. AXILLARY, RIGHT, LYMPH NODE:  -  1 of 3 lymph nodes positive for metastatic carcinoma, consistent with  the patient's known endometrial carcinoma (i.e. morphologically consistent with papillary serous carcinoma) with areas of geographic necrosis, negative for extracapsular extension on sections examined.    04/22/2022 Surgery   Preoperative Diagnosis: RIGHT AXILLARY LYMPHADENOPATHY    Postoperative Diagnosis: RIGHT AXILLARY LYMPHADENOPATHY    Surgical Procedure: RIGHT AXILLARY LYMPH NODE BIOPSY:     Operative Team Members:  Surgeon(s) and Role:    * Stechschulte, Nickola Major, MD - Primary     Specimen:  ID Type Source Tests Collected by Time Destination  1 : Right Axillary Lymph Node Tissue PATH Lymph nodes regional SURGICAL PATHOLOGY Stechschulte, Nickola Major, MD 04/22/2022 1040         Indications for Procedure: ISYS MECKES is a 72 y.o. female who presented with right axillary lymphadenopathy. I  recommended right axillary lymph node biopsy. The procedure itself as well as its risk, benefits, and alternatives were discussed the patient in full. After full discussion all questions answered the patient granted consent to proceed.    Findings: Firm enlarged right axillary lymph node    Description of Procedure:    On the date stated above the patient was taken operating room placed in supine position.  Anesthesia was induced.  A timeout was completed verifying correct patient, procedure, positioning, and equipment needed for the case.  The patient's chest and right axilla was prepped and draped in the usual sterile fashion.   I made a incision along the hairline anterior to the axilla and dissected through the subcutaneous tissues into the axilla.  Open the axillary fascia and palpated for the lymph node.  The lymph node was dissected circumferentially.  The blood supply of the lymph node was controlled using two 2-0 Vicryl sutures.  Was passed off the field as a specimen.  The wound was irrigated and hemostasis was obtained.  The wound was closed in layers with 2-0 Vicryl suture running the deeper layers together and 4-0 Monocryl and Dermabond for the skin.  All sponge needle counts were correct at the end of the case.     PHYSICAL EXAMINATION: ECOG PERFORMANCE STATUS: 0 - Asymptomatic  Vitals:   06/17/22 0846  BP: (!) 143/67  Pulse: 74  Resp: 18  Temp: 97.9 F (36.6 C)  SpO2: 97%  Filed Weights   06/17/22 0846  Weight: 159 lb 3.2 oz (72.2 kg)    GENERAL:alert, no distress and comfortable  NEURO: alert & oriented x 3 with fluent speech, no focal motor/sensory deficits  LABORATORY DATA:  I have reviewed the data as listed    Component Value Date/Time   NA 144 04/05/2022 1420   K 3.7 04/05/2022 1420   CL 114 (H) 04/05/2022 1420   CO2 24 04/05/2022 1420   GLUCOSE 173 (H) 04/05/2022 1420   BUN 23 04/05/2022 1420   CREATININE 1.05 (H) 04/05/2022 1420   CALCIUM 9.2  04/05/2022 1420   PROT 5.8 (L) 04/05/2022 1420   ALBUMIN 3.2 (L) 04/05/2022 1420   AST 23 04/05/2022 1420   ALT 26 04/05/2022 1420   ALKPHOS 75 04/05/2022 1420   BILITOT 0.5 04/05/2022 1420   GFRNONAA 57 (L) 04/05/2022 1420   GFRAA 34 (L) 01/10/2020 1200    No results found for: "SPEP", "UPEP"  Lab Results  Component Value Date   WBC 4.7 04/05/2022   NEUTROABS 3.0 04/05/2022   HGB 12.4 04/05/2022   HCT 38.7 04/05/2022   MCV 91.3 04/05/2022   PLT 163 04/05/2022      Chemistry      Component Value Date/Time   NA 144 04/05/2022 1420   K 3.7 04/05/2022 1420   CL 114 (H) 04/05/2022 1420   CO2 24 04/05/2022 1420   BUN 23 04/05/2022 1420   CREATININE 1.05 (H) 04/05/2022 1420      Component Value Date/Time   CALCIUM 9.2 04/05/2022 1420   ALKPHOS 75 04/05/2022 1420   AST 23 04/05/2022 1420   ALT 26 04/05/2022 1420   BILITOT 0.5 04/05/2022 1420

## 2022-06-22 DIAGNOSIS — M228X1 Other disorders of patella, right knee: Secondary | ICD-10-CM | POA: Diagnosis not present

## 2022-06-22 DIAGNOSIS — M1711 Unilateral primary osteoarthritis, right knee: Secondary | ICD-10-CM | POA: Diagnosis not present

## 2022-06-22 DIAGNOSIS — M25561 Pain in right knee: Secondary | ICD-10-CM | POA: Diagnosis not present

## 2022-06-22 DIAGNOSIS — M85861 Other specified disorders of bone density and structure, right lower leg: Secondary | ICD-10-CM | POA: Diagnosis not present

## 2022-06-22 DIAGNOSIS — I739 Peripheral vascular disease, unspecified: Secondary | ICD-10-CM | POA: Diagnosis not present

## 2022-06-22 DIAGNOSIS — M25461 Effusion, right knee: Secondary | ICD-10-CM | POA: Diagnosis not present

## 2022-06-22 DIAGNOSIS — G8929 Other chronic pain: Secondary | ICD-10-CM | POA: Diagnosis not present

## 2022-07-07 DIAGNOSIS — M25561 Pain in right knee: Secondary | ICD-10-CM | POA: Diagnosis not present

## 2022-07-07 DIAGNOSIS — M25661 Stiffness of right knee, not elsewhere classified: Secondary | ICD-10-CM | POA: Diagnosis not present

## 2022-07-07 DIAGNOSIS — G8929 Other chronic pain: Secondary | ICD-10-CM | POA: Diagnosis not present

## 2022-07-07 DIAGNOSIS — M1711 Unilateral primary osteoarthritis, right knee: Secondary | ICD-10-CM | POA: Diagnosis not present

## 2022-07-15 DIAGNOSIS — M25661 Stiffness of right knee, not elsewhere classified: Secondary | ICD-10-CM | POA: Diagnosis not present

## 2022-07-15 DIAGNOSIS — M1711 Unilateral primary osteoarthritis, right knee: Secondary | ICD-10-CM | POA: Diagnosis not present

## 2022-07-15 DIAGNOSIS — G8929 Other chronic pain: Secondary | ICD-10-CM | POA: Diagnosis not present

## 2022-07-15 DIAGNOSIS — M25561 Pain in right knee: Secondary | ICD-10-CM | POA: Diagnosis not present

## 2022-07-20 ENCOUNTER — Ambulatory Visit (HOSPITAL_COMMUNITY)
Admission: RE | Admit: 2022-07-20 | Discharge: 2022-07-20 | Disposition: A | Discharge status: Home / Self Care | Payer: Medicare Other | Source: Ambulatory Visit | Attending: Hematology and Oncology | Admitting: Hematology and Oncology

## 2022-07-20 ENCOUNTER — Inpatient Hospital Stay: Payer: Medicare Other | Attending: Gynecology

## 2022-07-20 ENCOUNTER — Other Ambulatory Visit: Payer: Self-pay

## 2022-07-20 DIAGNOSIS — R918 Other nonspecific abnormal finding of lung field: Secondary | ICD-10-CM | POA: Insufficient documentation

## 2022-07-20 DIAGNOSIS — C541 Malignant neoplasm of endometrium: Secondary | ICD-10-CM | POA: Insufficient documentation

## 2022-07-20 DIAGNOSIS — N183 Chronic kidney disease, stage 3 unspecified: Secondary | ICD-10-CM | POA: Diagnosis not present

## 2022-07-20 DIAGNOSIS — E039 Hypothyroidism, unspecified: Secondary | ICD-10-CM

## 2022-07-20 DIAGNOSIS — D61818 Other pancytopenia: Secondary | ICD-10-CM | POA: Diagnosis not present

## 2022-07-20 DIAGNOSIS — Z79899 Other long term (current) drug therapy: Secondary | ICD-10-CM | POA: Diagnosis not present

## 2022-07-20 DIAGNOSIS — C773 Secondary and unspecified malignant neoplasm of axilla and upper limb lymph nodes: Secondary | ICD-10-CM

## 2022-07-20 DIAGNOSIS — C539 Malignant neoplasm of cervix uteri, unspecified: Secondary | ICD-10-CM | POA: Diagnosis not present

## 2022-07-20 LAB — CBC WITH DIFFERENTIAL/PLATELET
Abs Immature Granulocytes: 0.01 10*3/uL (ref 0.00–0.07)
Basophils Absolute: 0 10*3/uL (ref 0.0–0.1)
Basophils Relative: 0 %
Eosinophils Absolute: 0.1 10*3/uL (ref 0.0–0.5)
Eosinophils Relative: 3 %
HCT: 30.4 % — ABNORMAL LOW (ref 36.0–46.0)
Hemoglobin: 10 g/dL — ABNORMAL LOW (ref 12.0–15.0)
Immature Granulocytes: 0 %
Lymphocytes Relative: 28 %
Lymphs Abs: 1.1 10*3/uL (ref 0.7–4.0)
MCH: 28.7 pg (ref 26.0–34.0)
MCHC: 32.9 g/dL (ref 30.0–36.0)
MCV: 87.4 fL (ref 80.0–100.0)
Monocytes Absolute: 0.3 10*3/uL (ref 0.1–1.0)
Monocytes Relative: 7 %
Neutro Abs: 2.4 10*3/uL (ref 1.7–7.7)
Neutrophils Relative %: 62 %
Platelets: 178 10*3/uL (ref 150–400)
RBC: 3.48 MIL/uL — ABNORMAL LOW (ref 3.87–5.11)
RDW: 13.1 % (ref 11.5–15.5)
WBC: 3.9 10*3/uL — ABNORMAL LOW (ref 4.0–10.5)
nRBC: 0 % (ref 0.0–0.2)

## 2022-07-20 LAB — TSH: TSH: <0.01 u[IU]/mL — ABNORMAL LOW (ref 0.350–4.500)

## 2022-07-20 LAB — COMPREHENSIVE METABOLIC PANEL
ALT: 24 U/L (ref 0–44)
AST: 18 U/L (ref 15–41)
Albumin: 3.8 g/dL (ref 3.5–5.0)
Alkaline Phosphatase: 86 U/L (ref 38–126)
Anion gap: 4 — ABNORMAL LOW (ref 5–15)
BUN: 37 mg/dL — ABNORMAL HIGH (ref 8–23)
CO2: 24 mmol/L (ref 22–32)
Calcium: 10.3 mg/dL (ref 8.9–10.3)
Chloride: 114 mmol/L — ABNORMAL HIGH (ref 98–111)
Creatinine, Ser: 1.36 mg/dL — ABNORMAL HIGH (ref 0.44–1.00)
GFR, Estimated: 42 mL/min — ABNORMAL LOW (ref >60)
Glucose, Bld: 119 mg/dL — ABNORMAL HIGH (ref 70–99)
Potassium: 4.5 mmol/L (ref 3.5–5.1)
Sodium: 142 mmol/L (ref 135–145)
Total Bilirubin: 0.5 mg/dL (ref 0.3–1.2)
Total Protein: 6.4 g/dL — ABNORMAL LOW (ref 6.5–8.1)

## 2022-07-20 LAB — T4, FREE: Free T4: 2.78 ng/dL — ABNORMAL HIGH (ref 0.61–1.12)

## 2022-07-20 MED ORDER — IOHEXOL 300 MG/ML  SOLN
80.0000 mL | Freq: Once | INTRAMUSCULAR | Status: AC | PRN
  Administered 2022-07-20: 80 mL via INTRAVENOUS

## 2022-07-20 MED ORDER — SODIUM CHLORIDE 0.9% FLUSH
10.0000 mL | Freq: Once | INTRAVENOUS | Status: AC
  Administered 2022-07-20: 10 mL

## 2022-07-20 MED ORDER — HEPARIN SOD (PORK) LOCK FLUSH 100 UNIT/ML IV SOLN
500.0000 [IU] | Freq: Once | INTRAVENOUS | Status: AC
  Administered 2022-07-20: 500 [IU] via INTRAVENOUS

## 2022-07-21 DIAGNOSIS — M25661 Stiffness of right knee, not elsewhere classified: Secondary | ICD-10-CM | POA: Diagnosis not present

## 2022-07-21 DIAGNOSIS — M25561 Pain in right knee: Secondary | ICD-10-CM | POA: Diagnosis not present

## 2022-07-21 DIAGNOSIS — M1711 Unilateral primary osteoarthritis, right knee: Secondary | ICD-10-CM | POA: Diagnosis not present

## 2022-07-21 DIAGNOSIS — G8929 Other chronic pain: Secondary | ICD-10-CM | POA: Diagnosis not present

## 2022-07-22 ENCOUNTER — Inpatient Hospital Stay (HOSPITAL_BASED_OUTPATIENT_CLINIC_OR_DEPARTMENT_OTHER): Payer: Medicare Other | Admitting: Hematology and Oncology

## 2022-07-22 ENCOUNTER — Other Ambulatory Visit: Payer: Self-pay

## 2022-07-22 ENCOUNTER — Encounter: Payer: Self-pay | Admitting: Hematology and Oncology

## 2022-07-22 VITALS — BP 153/76 | HR 74 | Temp 97.5°F | Resp 18 | Ht 66.0 in | Wt 167.8 lb

## 2022-07-22 DIAGNOSIS — C541 Malignant neoplasm of endometrium: Secondary | ICD-10-CM

## 2022-07-22 DIAGNOSIS — N183 Chronic kidney disease, stage 3 unspecified: Secondary | ICD-10-CM | POA: Diagnosis not present

## 2022-07-22 DIAGNOSIS — R918 Other nonspecific abnormal finding of lung field: Secondary | ICD-10-CM | POA: Diagnosis not present

## 2022-07-22 DIAGNOSIS — D61818 Other pancytopenia: Secondary | ICD-10-CM

## 2022-07-22 DIAGNOSIS — Z79899 Other long term (current) drug therapy: Secondary | ICD-10-CM | POA: Diagnosis not present

## 2022-07-22 NOTE — Assessment & Plan Note (Signed)
Renal function is stable.  She will continue risk factor modification 

## 2022-07-22 NOTE — Progress Notes (Signed)
Krista Johnson OFFICE PROGRESS NOTE  Patient Care Team: Krista Johnson, Carmelia, MD as PCP - General (Family Medicine)  ASSESSMENT & PLAN:  Endometrial cancer [facility]HCC) I have reviewed multiple imaging studies with the patient She has no evidence of disease after recent surgical resection She tolerated antiestrogen therapy well Moving forward, we discussed the risk and benefits of continuing antiestrogen therapy versus immunotherapy versus observation only She is undecided We will continue port flush maintains appointment every 2 months I plan to repeat imaging study in 6 months, due in October  Pancytopenia, acquired [facility](HCC) This is chronic in nature She is not symptomatic Observe only for now  Chronic kidney disease (CKD), stage III (moderate) (HCC) Renal function is stable She will continue risk factor modification  No orders of the defined types were placed in this encounter.   All questions were answered. The patient knows to call the clinic with any problems, questions or concerns. The total time spent in the appointment was 30 minutes encounter with patients including review of chart and various tests results, discussions about plan of care and coordination of care plan   Krista DelayNi Krista Potempa, MD 07/22/2022 4:29 PM  INTERVAL HISTORY: Please see below for problem oriented charting. she returns for treatment follow-up and review of CT imaging results She is doing well She has occasional hot flashes We spent majority of our time reviewing imaging study and discussed next step  REVIEW OF SYSTEMS:   Constitutional: Denies fevers, chills or abnormal weight loss Eyes: Denies blurriness of vision Ears, nose, mouth, throat, and face: Denies mucositis or sore throat Respiratory: Denies cough, dyspnea or wheezes Cardiovascular: Denies palpitation, chest discomfort or lower extremity swelling Gastrointestinal:  Denies nausea, heartburn or change in bowel habits Skin: Denies abnormal skin  rashes Lymphatics: Denies new lymphadenopathy or easy bruising Neurological:Denies numbness, tingling or new weaknesses Behavioral/Psych: Mood is stable, no new changes  All other systems were reviewed with the patient and are negative.  I have reviewed the past medical history, past surgical history, social history and family history with the patient and they are unchanged from previous note.  ALLERGIES:  is allergic to sulfa antibiotics and sulfamethoxazole.  MEDICATIONS:  Current Outpatient Medications  Medication Sig Dispense Refill   amLODipine-valsartan (EXFORGE) 5-160 MG tablet Take 1 tablet by mouth daily after breakfast.     Azelastine HCl 0.15 % SOLN Place 1-2 sprays into both nostrils daily as needed (sinus congestion/issues.).     calcium elemental as carbonate (BARIATRIC TUMS ULTRA) 400 MG chewable tablet Chew 2 tablets by mouth daily in the afternoon.     cholecalciferol (VITAMIN D3) 25 MCG (1000 UNIT) tablet Take 5 tablets (5,000 Units total) by mouth daily. (Patient not taking: Reported on 04/13/2022) 30 tablet 2   diphenhydrAMINE (BENADRYL) 25 MG tablet Take 25 mg by mouth daily as needed (runny nose/sinus issues.).     ergocalciferol (VITAMIN D2) 1.25 MG (50000 UT) capsule Take 1 capsule (50,000 Units total) by mouth once a week. (Patient taking differently: Take 50,000 Units by mouth 2 (two) times a week. Wednesdays & Saturdays.) 25 capsule 1   fexofenadine (ALLEGRA) 180 MG tablet Take 180 mg by mouth daily as needed for allergies or rhinitis.     glucose blood (ONETOUCH VERIO) test strip Use as instructed to check blood sugars 3 times a day. 100 each 3   letrozole (FEMARA) 2.5 MG tablet Take 1 tablet (2.5 mg total) by mouth daily. 90 tablet 3   levothyroxine (EUTHYROX) 200 MCG  tablet Take 1 tablet (200 mcg total) by mouth daily before breakfast. 90 tablet 3   levothyroxine (EUTHYROX) 50 MCG tablet Take 1 tablet (50 mcg total) by mouth daily before breakfast. 90 tablet 3    lidocaine-prilocaine (EMLA) cream Apply topically daily as needed. (Patient taking differently: Apply 1 Application topically daily as needed (prior to port access.).) 30 g 3   OneTouch Delica Lancets 33G MISC 1 each by Other route 2 (two) times daily. Use to monitor glucose levels BID; E11.65 100 each 2   RELION PEN NEEDLES 32G X 4 MM MISC USE 1 PEN PER DAY 50 each 5   simvastatin (ZOCOR) 10 MG tablet Take 10 mg by mouth at bedtime.      No current facility-administered medications for this visit.    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 In the left breast, no findings suspicious for malignancy.   Images were processed with CAD.   IMPRESSION: Further evaluation is suggested for possible mass in the right axilla.     12/24/2017 Imaging   Ct scan chest, abdomen and pelvis 1. Marked thickening of the endometrium (42 mm) compatible with known primary endometrial malignancy. No evidence of extrauterine invasion. 2. No pelvic or retroperitoneal adenopathy. 3. Right middle lobe 4 mm solid pulmonary nodule, for which follow-up chest CT is advised in 3-6 months. 4. Several findings that are equivocal for distant metastatic disease. Vaguely nodular heterogeneous hyperenhancement in the peripheral right liver  lobe, which could represent benign transient perfusional phenomena, with underlying liver lesions not entirely excluded. Mildly sclerotic T12 vertebral lesion. Mildly enlarged right axillary lymph node. The best single test to further evaluate these findings would be a PET-CT. Alternative tests include bone scan or thoracic MRI without and with IV contrast for the T12 osseous lesion, MRI abdomen without and with IV contrast for the liver findings, and diagnostic mammographic evaluation for the right axillary node. 5.  Aortic Atherosclerosis (ICD10-I70.0).     01/09/2018 PET scan   1. Moderate hypermetabolism corresponding to enlarging axillary node since 12/22/2017. Highly suspicious for an atypical distribution of metastatic disease. 2. No hypermetabolism to suggest hepatic or T12 osseous metastasis. 3. Hypermetabolic endometrial primary.   01/23/2018 Initial Diagnosis   Endometrial cancer (HCC)   01/23/2018 Pathology Results   1. Lymph node, sentinel, biopsy, left external iliac - NO CARCINOMA IDENTIFIED IN ONE LYMPH NODE (0/1) - SEE COMMENT 2. Lymph nodes, regional resection, right para aortic - NO CARCINOMA IDENTIFIED IN FOUR LYMPH NODES (0/4) - SEE COMMENT 3. Lymph nodes, regional resection, right pelvic - NO CARCINOMA IDENTIFIED IN EIGHT LYMPH NODES (0/8) - SEE COMMENT 4. Cul-de-sac biopsy - METASTATIC CARCINOMA 5. Uterus +/- tubes/ovaries, neoplastic, cervix, bilateral fallopian tubes and ovaries UTERUS: - MIXED SEROUS AND ENDOMETRIOID CARCINOMA - SEROSAL IMPLANTS PRESENT - LYMPHOVASCULAR SPACE INVASION PRESENT - LEIOMYOMATA (1.5 CM; LARGEST) -  SEE ONCOLOGY TABLE AND COMMENT BELOW CERVIX: - BENIGN NABOTHIAN CYSTS - NO CARCINOMA IDENTIFIED BILATERAL OVARIES: - METASTATIC CARCINOMA PRESENT ON OVARIAN SURFACE BILATERAL FALLOPIAN TUBES: - INTRALUMINAL CARCINOMA Microscopic Comment 1. -3. Cytokeratin AE1/3 was performed on the sentinel lymph nodes to exclude micrometastasis.  There is no evidence of metastatic carcinoma by immunohistochemistry. 5. 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%) Histologic Grade: N/A Myometrial invasion: Estimated less than 50% myometrial invasion (0.3 cm of myometrium involved; 1.4 cm measured thickness) Uterine Serosa Involvement: Present Cervical stromal involvement: Not identified Extent of involvement of other organs: - Fallopian tube (left within the lumen) - Ovary, left (surface involvement) - Cul-de-sac Lymphovascular invasion: Present Regional Lymph Nodes: Examined: 1 Sentinel 12 Non-sentinel 13 Total Lymph nodes with metastasis: 0 Isolated tumor cells (< 0.2 mm): 0 Micrometastasis: (> 0.2 mm and < 2.0 mm): 0 Macrometastasis: (> 2.0 mm): 0 Extracapsular extension: N/A Tumor block for ancillary studies: 5E, 5B MMR / MSI testing: Pending will be reported separately Pathologic Stage Classification (pTNM, AJCC 8th edition): pT3a, pN0 FIGO Stage: IIIA COMMENT: There is tumor present on the surface of the left ovary and within the lumen of the left fallopian tube. The carcinoma appears to be mixed with the largest component being high grade serous carcinoma. Dr. Colonel Bald reviewed the case and agrees with the above diagnosis.   01/23/2018 Surgery   Surgeon: Quinn Axe     Operation: Robotic-assisted laparoscopic total hysterectomy with bilateral salpingoophorectomy, SLN injection, mapping and biopsy, right pelvic and para-aortic lymphadenectomy   Operative Findings:  : 10-12cm bulky uterus with frank serosal involvement on posterior cul de sac peritoneum and anterior peritoneum which adhesed the bladder to the anterior uterus. Unilateral mapping on left pelvis. No grossly suspicious nodes.  Normal omentum and diaphragms.    02/01/2018 Pathology Results   Lymph node, needle/core biopsy, right axilla - METASTATIC CARCINOMA - SEE COMMENT Microscopic Comment The neoplastic cells are positive for cytokeratin 7 and Pax-8 but negative for cytokeratin 5/6, cytokeratin 20, Gata-3, p63, p53 and GCDFP. Overall, the immunoprofile is consistent with metastasis from the patient's known gynecologic carcinoma.   02/01/2018 Procedure   Ultrasound-guided core biopsies of a suspicious right axillary lymph node.   02/21/2018 - 06/13/2018 Chemotherapy   The patient had carboplatin & Taxol x 6   03/26/2018 - 04/30/2018 Radiation Therapy   Radiation treatment dates:   03/26/18, 04/02/18, 04/19/18, 04/23/18 04/30/18   Site/dose: proximal Vagina, 6 Gy in 5 fractions for a total dose of 30 Gy     04/26/2018 PET scan   1. Interval resection of the hypermetabolic endometrial primary. No discernible hypermetabolic pelvic sidewall or peritoneal metastases. 2. Stable hypermetabolic bilateral axillary lymphadenopathy. The degree of hypermetabolism in these lymph nodes remains highly suspicious for neoplasm. 3. Interval development of low level FDG uptake in 2 stable, small lymph nodes in the left groin region. This may be reactive, but close attention recommended to exclude metastatic involvement. 4. Stable non hypermetabolic low-density liver lesions and the mixed lucent and sclerotic T12 lesion is stable without hypermetabolism today.   07/09/2018 Imaging   Status post hysterectomy.   Mild axillary lymphadenopathy, improved. Nodal metastases not excluded.   Irregular wall thickening involving the anterior bladder, possibly reflecting radiation cystitis versus tumor.   Additional stable findings as above, including a 5 mm right middle lobe  nodule and stable sclerosis involving the T12 vertebral body.   10/10/2018 Imaging   1. Stable exam. No findings within the abdomen or pelvis to suggest metastatic  disease. 2. Unchanged appearance of sclerosis involving the T12 vertebra. 3.  Aortic Atherosclerosis (ICD10-I70.0).   10/10/2018 Imaging   Ct abdomen and pelvis 1. Stable exam. No findings within the abdomen or pelvis to suggest metastatic disease. 2. Unchanged appearance of sclerosis involving the T12 vertebra. 3.  Aortic Atherosclerosis (ICD10-I70.0).   01/16/2019 PET scan   1. Enlargement and increased activity in the left axillary, right axillary, and left inguinal adenopathy compatible with progressive malignancy. 2. Stable 4 mm right middle lobe subpleural nodule, not appreciably hypermetabolic. 3. Other imaging findings of potential clinical significance: Right kidney lower pole cyst. Aortic Atherosclerosis (ICD10-I70.0). Prominent stool throughout the colon favors constipation. Mild chronic left maxillary sinusitis.     02/01/2019 -  Chemotherapy   The patient had pembrolizumab and Lenvima for chemotherapy treatment.     04/25/2019 Imaging   Ct imaging 1. Interval response to therapy. Decrease in size of bilateral axillary and left inguinal lymph nodes. No new or progressive findings identified. 2. Stable sclerotic appearance of the T12 vertebra. 3. Aortic atherosclerosis. Lad coronary artery calcification noted.   08/30/2019 Imaging   1. Bulky RIGHT hilar lymph node, slightly increased in size from previous imaging. 2. Multiple foci of hyperenhancement in the liver. 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 Consider MRI liver on follow-up to establish a baseline for number of lesions as these will appear variable on CT follow-up based on phase of contrast acquisition in the future. 3. Stable 5 mm nodule adjacent to the fissure, minor fissure in the RIGHT middle lobe.     11/28/2019  Imaging   1. Stable exam. No new or progressive findings. 2. Stable enlarged right axillary lymph node. 3. Stable tiny bilateral pulmonary nodules. Continued attention on follow-up recommended. 4. Scattered hypodensities in the liver parenchyma correspond to the hypervascular lesions seen on the previous study performed with intravenous contrast material. 5. Right renal cyst. 6. Aortic Atherosclerosis (ICD10-I70.0).     03/16/2020 Imaging   Increased echogenicity of renal parenchyma is noted bilaterally suggesting medical renal disease. No hydronephrosis or renal obstruction is noted   04/02/2020 Imaging   1. Right axillary lymphadenopathy is stable. Tiny bilateral pulmonary nodules are stable. Subcentimeter low-attenuation liver lesions are stable. 2. No new or progressive metastatic disease in the chest, abdomen or pelvis. 3. Aortic Atherosclerosis (ICD10-I70.0).   09/15/2020 Imaging   1. Stable RIGHT axillary lymph node with enlargement measuring 2 cm short axis. 2. Stable small nodule along the minor fissure in the RIGHT chest. 3. No new or progressive findings. 4. Aortic atherosclerosis.   03/24/2021 Imaging   1. Right axillary lymphadenopathy is stable compared to prior studies. 2. No other definite signs of metastatic disease elsewhere in the thorax. 3. Hepatic steatosis. Numerous hypervascular areas noted in the liver, incompletely characterized on today's arterial phase examination. These are similar to remote prior examination from 08/30/2019, likely to represent small benign lesions such as flash fill cavernous hemangiomas. These could be definitively evaluated with nonemergent abdominal MRI with and without IV gadolinium if of clinical concern. 4. Aortic atherosclerosis, in addition to left main and left anterior descending  coronary artery disease. Assessment for potential risk factor modification, dietary therapy or pharmacologic therapy may be warranted, if clinically  indicated.   Aortic Atherosclerosis (ICD10-I70.0).   09/16/2021 Imaging   1. Unchanged enlarged right axillary lymph node. No evidence of new lymphadenopathy or metastatic disease in the chest, abdomen, or pelvis. 2. Subcentimeter hyperenhancing lesions of the right lobe of the liver, hepatic segments VII and VIII are unchanged, measuring up to 0.7 cm. These are likely small benign incidental flash filling hemangiomata although incompletely characterized on this examination. Attention on follow-up. 3. Status post hysterectomy. 4. Cholelithiasis. 5. Coronary artery disease.   Aortic Atherosclerosis (ICD10-I70.0).     12/31/2021 - 03/04/2022 Chemotherapy   Patient is on Treatment Plan : UTERINE Lenvatinib (20) D1-21 + Pembrolizumab (200) D1 q21d     03/24/2022 Imaging   1. Similar to mild enlargement of an isolated right axillary nodal mass. 2. No new sites of disease identified. 3.  Possible constipation. 4. Coronary artery atherosclerosis. Aortic Atherosclerosis (ICD10-I70.0).   04/22/2022 Pathology Results   A. AXILLARY, RIGHT, LYMPH NODE:  -  1 of 3 lymph nodes positive for metastatic carcinoma, consistent with  the patient's known endometrial carcinoma (i.e. morphologically consistent with papillary serous carcinoma) with areas of geographic necrosis, negative for extracapsular extension on sections examined.    04/22/2022 Surgery   Preoperative Diagnosis: RIGHT AXILLARY LYMPHADENOPATHY    Postoperative Diagnosis: RIGHT AXILLARY LYMPHADENOPATHY    Surgical Procedure: RIGHT AXILLARY LYMPH NODE BIOPSY:     Operative Team Members:  Surgeon(s) and Role:    * Stechschulte, Hyman Hopes, MD - Primary     Specimen:  ID Type Source Tests Collected by Time Destination  1 : Right Axillary Lymph Node Tissue PATH Lymph nodes regional SURGICAL PATHOLOGY Stechschulte, Hyman Hopes, MD 04/22/2022 1040         Indications for Procedure: Krista Johnson is a 72 y.o. female who presented with right axillary  lymphadenopathy. I recommended right axillary lymph node biopsy. The procedure itself as well as its risk, benefits, and alternatives were discussed the patient in full. After full discussion all questions answered the patient granted consent to proceed.    Findings: Firm enlarged right axillary lymph node    Description of Procedure:    On the date stated above the patient was taken operating room placed in supine position.  Anesthesia was induced.  A timeout was completed verifying correct patient, procedure, positioning, and equipment needed for the case.  The patient's chest and right axilla was prepped and draped in the usual sterile fashion.   I made a incision along the hairline anterior to the axilla and dissected through the subcutaneous tissues into the axilla.  Open the axillary fascia and palpated for the lymph node.  The lymph node was dissected circumferentially.  The blood supply of the lymph node was controlled using two 2-0 Vicryl sutures.  Was passed off the field as a specimen.  The wound was irrigated and hemostasis was obtained.  The wound was closed in layers with 2-0 Vicryl suture running the deeper layers together and 4-0 Monocryl and Dermabond for the skin.  All sponge needle counts were correct at the end of the case.   07/21/2022 Imaging   CT CHEST ABDOMEN PELVIS W CONTRAST  Result Date: 07/20/2022 CLINICAL DATA:  Cervical/endometrial cancer/evaluate treatment response. Lung nodules. * Tracking Code: BO * EXAM: CT CHEST, ABDOMEN, AND PELVIS WITH CONTRAST TECHNIQUE: Multidetector CT imaging of the chest, abdomen and pelvis was performed  following the standard protocol during bolus administration of intravenous contrast. RADIATION DOSE REDUCTION: This exam was performed according to the departmental dose-optimization program which includes automated exposure control, adjustment of the mA and/or kV according to patient size and/or use of iterative reconstruction technique. CONTRAST:   80mL OMNIPAQUE IOHEXOL 300 MG/ML  SOLN COMPARISON:  03/23/2022 FINDINGS: CT CHEST FINDINGS Cardiovascular: Right Port-A-Cath tip superior caval/atrial junction. Aortic atherosclerosis. Tortuous thoracic aorta. Mild cardiomegaly, without pericardial effusion. Lad coronary artery calcification. No central pulmonary embolism, on this non-dedicated study. Mediastinum/Nodes: No supraclavicular adenopathy. Resolution of right axillary adenopathy. No mediastinal or hilar adenopathy. Lungs/Pleura: No pleural fluid. A Perifissural right middle lobe 4 mm pulmonary nodule on 67/5 is unchanged and likely a subpleural lymph node. Left upper lobe 4-5 mm ground-glass nodule is unchanged on 39/5. Musculoskeletal: No acute osseous abnormality. 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 No hypermetabolism on prior PET. CT ABDOMEN PELVIS FINDINGS Hepatobiliary: Normal liver. Normal gallbladder, without biliary ductal dilatation. Pancreas: Normal, without mass or ductal dilatation. Spleen: Normal in size, without focal abnormality. Adrenals/Urinary Tract: Normal adrenal glands. Normal left kidney. Exophytic inter/lower pole right renal 3.7 cm cyst . In the absence of clinically indicated signs/symptoms require(s) no independent follow-up. No hydronephrosis. Normal urinary bladder. Stomach/Bowel: Gastric antral underdistention. Colonic stool burden suggests constipation. Normal appendix. Normal small bowel. Vascular/Lymphatic: Aortic atherosclerosis. Small abdominal retroperitoneal nodes are similar, not pathologic by size criteria. No abdominopelvic adenopathy. Reproductive: Hysterectomy.  No adnexal mass. Other: No significant free fluid. No free intraperitoneal air. No evidence of omental or peritoneal disease. Musculoskeletal: Osteopenia. Trace L4-5 anterolisthesis with prominent disc bulges at L3-4 through L5-S1. IMPRESSION: 1. Resolution of right axillary adenopathy.  2. No evidence of residual metastatic disease. 3.  Possible constipation. 4. Coronary artery atherosclerosis. Aortic Atherosclerosis (ICD10-I70.0). Electronically Signed   By: Jeronimo Greaves M.D.   On: 07/20/2022 16:17        PHYSICAL EXAMINATION: ECOG PERFORMANCE STATUS: 0 - Asymptomatic  Vitals:   07/22/22 0902  BP: (!) 153/76  Pulse: 74  Resp: 18  Temp: (!) 97.5 F (36.4 C)  SpO2: 100%   Filed Weights   07/22/22 0902  Weight: 167 lb 12.8 oz (76.1 kg)    GENERAL:alert, no distress and comfortable  NEURO: alert & oriented x 3 with fluent speech, no focal motor/sensory deficits  LABORATORY DATA:  I have reviewed the data as listed    Component Value Date/Time   NA 142 07/20/2022 0937   K 4.5 07/20/2022 0937   CL 114 (H) 07/20/2022 0937   CO2 24 07/20/2022 0937   GLUCOSE 119 (H) 07/20/2022 0937   BUN 37 (H) 07/20/2022 0937   CREATININE 1.36 (H) 07/20/2022 0937   CREATININE 1.05 (H) 04/05/2022 1420   CALCIUM 10.3 07/20/2022 0937   PROT 6.4 (L) 07/20/2022 0937   ALBUMIN 3.8 07/20/2022 0937   AST 18 07/20/2022 0937   AST 23 04/05/2022 1420   ALT 24 07/20/2022 0937   ALT 26 04/05/2022 1420   ALKPHOS 86 07/20/2022 0937   BILITOT 0.5 07/20/2022 0937   BILITOT 0.5 04/05/2022 1420   GFRNONAA 42 (L) 07/20/2022 0937   GFRNONAA 57 (L) 04/05/2022 1420   GFRAA 34 (L) 01/10/2020 1200    No results found for: "SPEP", "UPEP"  Lab Results  Component Value Date   WBC 3.9 (L) 07/20/2022   NEUTROABS 2.4 07/20/2022   HGB 10.0 (L) 07/20/2022   HCT 30.4 (  L) 07/20/2022   MCV 87.4 07/20/2022   PLT 178 07/20/2022      Chemistry      Component Value Date/Time   NA 142 07/20/2022 0937   K 4.5 07/20/2022 0937   CL 114 (H) 07/20/2022 0937   CO2 24 07/20/2022 0937   BUN 37 (H) 07/20/2022 0937   CREATININE 1.36 (H) 07/20/2022 0937   CREATININE 1.05 (H) 04/05/2022 1420      Component Value Date/Time   CALCIUM 10.3 07/20/2022 0937   ALKPHOS 86 07/20/2022 0937   AST 18  07/20/2022 0937   AST 23 04/05/2022 1420   ALT 24 07/20/2022 0937   ALT 26 04/05/2022 1420   BILITOT 0.5 07/20/2022 0937   BILITOT 0.5 04/05/2022 1420       RADIOGRAPHIC STUDIES: I have reviewed multiple imaging studies with the patient I have personally reviewed the radiological images as listed and agreed with the findings in the report. CT CHEST ABDOMEN PELVIS W CONTRAST  Result Date: 07/20/2022 CLINICAL DATA:  Cervical/endometrial cancer/evaluate treatment response. Lung nodules. * Tracking Code: BO * EXAM: CT CHEST, ABDOMEN, AND PELVIS WITH CONTRAST TECHNIQUE: Multidetector CT imaging of the chest, abdomen and pelvis was performed following the standard protocol during bolus administration of intravenous contrast. RADIATION DOSE REDUCTION: This exam was performed according to the departmental dose-optimization program which includes automated exposure control, adjustment of the mA and/or kV according to patient size and/or use of iterative reconstruction technique. CONTRAST:  80mL OMNIPAQUE IOHEXOL 300 MG/ML  SOLN COMPARISON:  03/23/2022 FINDINGS: CT CHEST FINDINGS Cardiovascular: Right Port-A-Cath tip superior caval/atrial junction. Aortic atherosclerosis. Tortuous thoracic aorta. Mild cardiomegaly, without pericardial effusion. Lad coronary artery calcification. No central pulmonary embolism, on this non-dedicated study. Mediastinum/Nodes: No supraclavicular adenopathy. Resolution of right axillary adenopathy. No mediastinal or hilar adenopathy. Lungs/Pleura: No pleural fluid. A Perifissural right middle lobe 4 mm pulmonary nodule on 67/5 is unchanged and likely a subpleural lymph node. Left upper lobe 4-5 mm ground-glass nodule is unchanged on 39/5. Musculoskeletal: No acute osseous abnormality. 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 No hypermetabolism on prior PET. CT ABDOMEN PELVIS FINDINGS Hepatobiliary: Normal liver. Normal  gallbladder, without biliary ductal dilatation. Pancreas: Normal, without mass or ductal dilatation. Spleen: Normal in size, without focal abnormality. Adrenals/Urinary Tract: Normal adrenal glands. Normal left kidney. Exophytic inter/lower pole right renal 3.7 cm cyst . In the absence of clinically indicated signs/symptoms require(s) no independent follow-up. No hydronephrosis. Normal urinary bladder. Stomach/Bowel: Gastric antral underdistention. Colonic stool burden suggests constipation. Normal appendix. Normal small bowel. Vascular/Lymphatic: Aortic atherosclerosis. Small abdominal retroperitoneal nodes are similar, not pathologic by size criteria. No abdominopelvic adenopathy. Reproductive: Hysterectomy.  No adnexal mass. Other: No significant free fluid. No free intraperitoneal air. No evidence of omental or peritoneal disease. Musculoskeletal: Osteopenia. Trace L4-5 anterolisthesis with prominent disc bulges at L3-4 through L5-S1. IMPRESSION: 1. Resolution of right axillary adenopathy. 2. No evidence of residual metastatic disease. 3.  Possible constipation. 4. Coronary artery atherosclerosis. Aortic Atherosclerosis (ICD10-I70.0). Electronically Signed   By: Jeronimo GreavesKyle  Talbot M.D.   On: 07/20/2022 16:17

## 2022-07-22 NOTE — Assessment & Plan Note (Signed)
I have reviewed multiple imaging studies with the patient She has no evidence of disease after recent surgical resection She tolerated antiestrogen therapy well Moving forward, we discussed the risk and benefits of continuing antiestrogen therapy versus immunotherapy versus observation only She is undecided We will continue port flush maintains appointment every 2 months I plan to repeat imaging study in 6 months, due in October

## 2022-07-22 NOTE — Assessment & Plan Note (Signed)
This is chronic in nature She is not symptomatic Observe only for now

## 2022-07-25 ENCOUNTER — Other Ambulatory Visit: Payer: Self-pay

## 2022-07-25 ENCOUNTER — Telehealth: Payer: Self-pay

## 2022-07-25 MED ORDER — LEVOTHYROXINE SODIUM 200 MCG PO TABS: 200.0000 ug | ORAL_TABLET | Freq: Every day | ORAL | 3 refills | Status: DC

## 2022-07-25 NOTE — Telephone Encounter (Signed)
-----   Message from Artis Delay, MD sent at 07/22/2022  4:34 PM EDT ----- Regarding: abnormal TSH I forgot to recommend dose reduction of synthroid I recommend reducing to 200 mcg (she is at 250 mcg)

## 2022-07-25 NOTE — Telephone Encounter (Signed)
She called the office back after I left her a message. Given message from Dr. Bertis Ruddy about reducing Synthroid to 20 mcg daily from 250 mcg daily. Rx sent to pharmacy. She verbalized understanding.

## 2022-07-28 DIAGNOSIS — M25661 Stiffness of right knee, not elsewhere classified: Secondary | ICD-10-CM | POA: Diagnosis not present

## 2022-07-28 DIAGNOSIS — M25561 Pain in right knee: Secondary | ICD-10-CM | POA: Diagnosis not present

## 2022-07-28 DIAGNOSIS — M25611 Stiffness of right shoulder, not elsewhere classified: Secondary | ICD-10-CM | POA: Diagnosis not present

## 2022-07-28 DIAGNOSIS — M25511 Pain in right shoulder: Secondary | ICD-10-CM | POA: Diagnosis not present

## 2022-07-28 DIAGNOSIS — G8929 Other chronic pain: Secondary | ICD-10-CM | POA: Diagnosis not present

## 2022-07-28 DIAGNOSIS — M1711 Unilateral primary osteoarthritis, right knee: Secondary | ICD-10-CM | POA: Diagnosis not present

## 2022-08-03 DIAGNOSIS — M25511 Pain in right shoulder: Secondary | ICD-10-CM | POA: Diagnosis not present

## 2022-08-03 DIAGNOSIS — M1711 Unilateral primary osteoarthritis, right knee: Secondary | ICD-10-CM | POA: Diagnosis not present

## 2022-08-03 DIAGNOSIS — M25611 Stiffness of right shoulder, not elsewhere classified: Secondary | ICD-10-CM | POA: Diagnosis not present

## 2022-08-03 DIAGNOSIS — M25661 Stiffness of right knee, not elsewhere classified: Secondary | ICD-10-CM | POA: Diagnosis not present

## 2022-08-03 DIAGNOSIS — G8929 Other chronic pain: Secondary | ICD-10-CM | POA: Diagnosis not present

## 2022-08-03 DIAGNOSIS — M25561 Pain in right knee: Secondary | ICD-10-CM | POA: Diagnosis not present

## 2022-08-09 DIAGNOSIS — N1831 Chronic kidney disease, stage 3a: Secondary | ICD-10-CM | POA: Diagnosis not present

## 2022-08-09 DIAGNOSIS — I10 Essential (primary) hypertension: Secondary | ICD-10-CM | POA: Diagnosis not present

## 2022-08-09 DIAGNOSIS — C801 Malignant (primary) neoplasm, unspecified: Secondary | ICD-10-CM | POA: Diagnosis not present

## 2022-08-09 DIAGNOSIS — E039 Hypothyroidism, unspecified: Secondary | ICD-10-CM | POA: Diagnosis not present

## 2022-08-09 DIAGNOSIS — I700 Atherosclerosis of aorta: Secondary | ICD-10-CM | POA: Diagnosis not present

## 2022-08-09 DIAGNOSIS — E1122 Type 2 diabetes mellitus with diabetic chronic kidney disease: Secondary | ICD-10-CM | POA: Diagnosis not present

## 2022-08-09 DIAGNOSIS — I2510 Atherosclerotic heart disease of native coronary artery without angina pectoris: Secondary | ICD-10-CM | POA: Diagnosis not present

## 2022-08-09 DIAGNOSIS — D61810 Antineoplastic chemotherapy induced pancytopenia: Secondary | ICD-10-CM | POA: Diagnosis not present

## 2022-08-09 DIAGNOSIS — M1990 Unspecified osteoarthritis, unspecified site: Secondary | ICD-10-CM | POA: Diagnosis not present

## 2022-08-09 LAB — HEMOGLOBIN A1C: Hemoglobin A1C: 6.8

## 2022-08-09 LAB — TSH: TSH: 0.01 — AB (ref 0.41–5.90)

## 2022-08-12 DIAGNOSIS — M25511 Pain in right shoulder: Secondary | ICD-10-CM | POA: Diagnosis not present

## 2022-08-14 ENCOUNTER — Encounter: Payer: Self-pay | Admitting: Endocrinology

## 2022-08-16 ENCOUNTER — Encounter: Payer: Self-pay | Admitting: Endocrinology

## 2022-08-17 DIAGNOSIS — G8929 Other chronic pain: Secondary | ICD-10-CM | POA: Diagnosis not present

## 2022-08-17 DIAGNOSIS — M25511 Pain in right shoulder: Secondary | ICD-10-CM | POA: Diagnosis not present

## 2022-08-17 DIAGNOSIS — M25611 Stiffness of right shoulder, not elsewhere classified: Secondary | ICD-10-CM | POA: Diagnosis not present

## 2022-08-17 DIAGNOSIS — M1711 Unilateral primary osteoarthritis, right knee: Secondary | ICD-10-CM | POA: Diagnosis not present

## 2022-08-17 DIAGNOSIS — M25661 Stiffness of right knee, not elsewhere classified: Secondary | ICD-10-CM | POA: Diagnosis not present

## 2022-08-17 DIAGNOSIS — M25561 Pain in right knee: Secondary | ICD-10-CM | POA: Diagnosis not present

## 2022-09-05 ENCOUNTER — Other Ambulatory Visit: Payer: Self-pay | Admitting: Hematology and Oncology

## 2022-09-08 DIAGNOSIS — M25611 Stiffness of right shoulder, not elsewhere classified: Secondary | ICD-10-CM | POA: Diagnosis not present

## 2022-09-08 DIAGNOSIS — M25661 Stiffness of right knee, not elsewhere classified: Secondary | ICD-10-CM | POA: Diagnosis not present

## 2022-09-08 DIAGNOSIS — M25561 Pain in right knee: Secondary | ICD-10-CM | POA: Diagnosis not present

## 2022-09-08 DIAGNOSIS — G8929 Other chronic pain: Secondary | ICD-10-CM | POA: Diagnosis not present

## 2022-09-15 DIAGNOSIS — M25561 Pain in right knee: Secondary | ICD-10-CM | POA: Diagnosis not present

## 2022-09-15 DIAGNOSIS — G8929 Other chronic pain: Secondary | ICD-10-CM | POA: Diagnosis not present

## 2022-09-15 DIAGNOSIS — M25611 Stiffness of right shoulder, not elsewhere classified: Secondary | ICD-10-CM | POA: Diagnosis not present

## 2022-09-15 DIAGNOSIS — M1711 Unilateral primary osteoarthritis, right knee: Secondary | ICD-10-CM | POA: Diagnosis not present

## 2022-09-15 DIAGNOSIS — M25661 Stiffness of right knee, not elsewhere classified: Secondary | ICD-10-CM | POA: Diagnosis not present

## 2022-09-15 DIAGNOSIS — M25511 Pain in right shoulder: Secondary | ICD-10-CM | POA: Diagnosis not present

## 2022-09-23 ENCOUNTER — Encounter: Payer: Self-pay | Admitting: Hematology and Oncology

## 2022-09-23 ENCOUNTER — Inpatient Hospital Stay: Payer: Medicare Other | Attending: Gynecology

## 2022-09-23 ENCOUNTER — Inpatient Hospital Stay (HOSPITAL_BASED_OUTPATIENT_CLINIC_OR_DEPARTMENT_OTHER): Payer: Medicare Other | Admitting: Hematology and Oncology

## 2022-09-23 ENCOUNTER — Other Ambulatory Visit: Payer: Self-pay

## 2022-09-23 ENCOUNTER — Telehealth: Payer: Self-pay

## 2022-09-23 VITALS — BP 151/84 | HR 84 | Temp 97.8°F | Resp 18 | Ht 66.0 in | Wt 171.8 lb

## 2022-09-23 DIAGNOSIS — C541 Malignant neoplasm of endometrium: Secondary | ICD-10-CM | POA: Diagnosis not present

## 2022-09-23 DIAGNOSIS — E039 Hypothyroidism, unspecified: Secondary | ICD-10-CM

## 2022-09-23 DIAGNOSIS — D61818 Other pancytopenia: Secondary | ICD-10-CM

## 2022-09-23 DIAGNOSIS — C773 Secondary and unspecified malignant neoplasm of axilla and upper limb lymph nodes: Secondary | ICD-10-CM

## 2022-09-23 DIAGNOSIS — Z79899 Other long term (current) drug therapy: Secondary | ICD-10-CM | POA: Diagnosis not present

## 2022-09-23 DIAGNOSIS — Z170 Estrogen receptor positive status [ER+]: Secondary | ICD-10-CM | POA: Diagnosis not present

## 2022-09-23 LAB — COMPREHENSIVE METABOLIC PANEL
ALT: 34 U/L (ref 0–44)
AST: 27 U/L (ref 15–41)
Albumin: 3.8 g/dL (ref 3.5–5.0)
Alkaline Phosphatase: 104 U/L (ref 38–126)
Anion gap: 5 (ref 5–15)
BUN: 36 mg/dL — ABNORMAL HIGH (ref 8–23)
CO2: 25 mmol/L (ref 22–32)
Calcium: 10.4 mg/dL — ABNORMAL HIGH (ref 8.9–10.3)
Chloride: 109 mmol/L (ref 98–111)
Creatinine, Ser: 1.36 mg/dL — ABNORMAL HIGH (ref 0.44–1.00)
GFR, Estimated: 42 mL/min — ABNORMAL LOW (ref >60)
Glucose, Bld: 237 mg/dL — ABNORMAL HIGH (ref 70–99)
Potassium: 4.3 mmol/L (ref 3.5–5.1)
Sodium: 139 mmol/L (ref 135–145)
Total Bilirubin: 0.4 mg/dL (ref 0.3–1.2)
Total Protein: 6.7 g/dL (ref 6.5–8.1)

## 2022-09-23 LAB — CBC WITH DIFFERENTIAL/PLATELET
Abs Immature Granulocytes: 0.01 10*3/uL (ref 0.00–0.07)
Basophils Absolute: 0 10*3/uL (ref 0.0–0.1)
Basophils Relative: 0 %
Eosinophils Absolute: 0.1 10*3/uL (ref 0.0–0.5)
Eosinophils Relative: 2 %
HCT: 35 % — ABNORMAL LOW (ref 36.0–46.0)
Hemoglobin: 11.4 g/dL — ABNORMAL LOW (ref 12.0–15.0)
Immature Granulocytes: 0 %
Lymphocytes Relative: 33 %
Lymphs Abs: 1.3 10*3/uL (ref 0.7–4.0)
MCH: 27.9 pg (ref 26.0–34.0)
MCHC: 32.6 g/dL (ref 30.0–36.0)
MCV: 85.8 fL (ref 80.0–100.0)
Monocytes Absolute: 0.3 10*3/uL (ref 0.1–1.0)
Monocytes Relative: 7 %
Neutro Abs: 2.3 10*3/uL (ref 1.7–7.7)
Neutrophils Relative %: 58 %
Platelets: 175 10*3/uL (ref 150–400)
RBC: 4.08 MIL/uL (ref 3.87–5.11)
RDW: 12.5 % (ref 11.5–15.5)
WBC: 3.9 10*3/uL — ABNORMAL LOW (ref 4.0–10.5)
nRBC: 0 % (ref 0.0–0.2)

## 2022-09-23 LAB — TSH: TSH: <0.01 u[IU]/mL — ABNORMAL LOW (ref 0.350–4.500)

## 2022-09-23 LAB — T4, FREE: Free T4: 2.26 ng/dL — ABNORMAL HIGH (ref 0.61–1.12)

## 2022-09-23 MED ORDER — HEPARIN SOD (PORK) LOCK FLUSH 100 UNIT/ML IV SOLN
500.0000 [IU] | Freq: Once | INTRAVENOUS | Status: AC
  Administered 2022-09-23: 500 [IU]

## 2022-09-23 MED ORDER — LEVOTHYROXINE SODIUM 150 MCG PO TABS: 150.0000 ug | ORAL_TABLET | Freq: Every day | ORAL | 3 refills | Status: DC

## 2022-09-23 MED ORDER — SODIUM CHLORIDE 0.9% FLUSH
10.0000 mL | Freq: Once | INTRAVENOUS | Status: AC
  Administered 2022-09-23: 10 mL

## 2022-09-23 NOTE — Telephone Encounter (Signed)
Called and given below message. She verbalized understanding and appreciated the call. She will hold levothyroxine for 3 days and then restart with new dosage. Rx sent to preferred pharmacy.

## 2022-09-23 NOTE — Assessment & Plan Note (Signed)
She tolerated antiestrogen therapy well We will continue port flush maintains appointment every 2 months I plan to repeat imaging study in 6 months, due in October

## 2022-09-23 NOTE — Telephone Encounter (Signed)
-----   Message from Artis Delay, MD sent at 09/23/2022  2:20 PM EDT ----- Krista Johnson is still too high I recommend holding off synthroid for 3 days and call in prescription of 150 mcg

## 2022-09-23 NOTE — Assessment & Plan Note (Signed)
This is chronic in nature She is not symptomatic Observe only for now 

## 2022-09-23 NOTE — Progress Notes (Signed)
[facility] OFFICE PROGRESS NOTE  Patient Care Team: [name], MD as PCP - General (Family Medicine)  ASSESSMENT & PLAN:  Endometrial cancer [facility]) She tolerated antiestrogen therapy well We will continue port flush maintains appointment every 2 months I plan to repeat imaging study in 6 months, due in October  Pancytopenia, acquired [facility]) This is chronic in nature She is not symptomatic Observe only for now  No orders of the defined types were placed in this encounter.   All questions were answered. The patient knows to call the clinic with any problems, questions or concerns. The total time spent in the appointment was 20 minutes encounter with patients including review of chart and various tests results, discussions about plan of care and coordination of care plan   Artis Delay, MD 09/23/2022 9:37 AM  INTERVAL HISTORY: Please see below for problem oriented charting. she returns for treatment follow-up  She tolerated antiestrogen therapy well Her shoulder mobility is gradually improving with therapy She is keen to go back to work part-time We discussed future follow-up and plan of care and timing of next imaging studies  REVIEW OF SYSTEMS:   Constitutional: Denies fevers, chills or abnormal weight loss Eyes: Denies blurriness of vision Ears, nose, mouth, throat, and face: Denies mucositis or sore throat Respiratory: Denies cough, dyspnea or wheezes Cardiovascular: Denies palpitation, chest discomfort or lower extremity swelling Gastrointestinal:  Denies nausea, heartburn or change in bowel habits Skin: Denies abnormal skin rashes Lymphatics: Denies new lymphadenopathy or easy bruising Neurological:Denies numbness, tingling or new weaknesses Behavioral/Psych: Mood is stable, no new changes  All other systems were reviewed with the patient and are negative.  I have reviewed the past medical history, past surgical history, social history and family  history with the patient and they are unchanged from previous note.  ALLERGIES:  is allergic to sulfa antibiotics and sulfamethoxazole.  MEDICATIONS:  Current Outpatient Medications  Medication Sig Dispense Refill   amLODipine-valsartan (EXFORGE) 5-160 MG tablet Take 1 tablet by mouth daily after breakfast.     Azelastine HCl 0.15 % SOLN Place 1-2 sprays into both nostrils daily as needed (sinus congestion/issues.).     calcium elemental as carbonate (BARIATRIC TUMS ULTRA) 400 MG chewable tablet Chew 2 tablets by mouth daily in the afternoon.     diphenhydrAMINE (BENADRYL) 25 MG tablet Take 25 mg by mouth daily as needed (runny nose/sinus issues.).     ergocalciferol (VITAMIN D2) 1.25 MG (50000 UT) capsule Take 1 capsule (50,000 Units total) by mouth once a week. (Patient taking differently: Take 50,000 Units by mouth 2 (two) times a week. Wednesdays & Saturdays.) 25 capsule 1   fexofenadine (ALLEGRA) 180 MG tablet Take 180 mg by mouth daily as needed for allergies or rhinitis.     glucose blood (ONETOUCH VERIO) test strip Use as instructed to check blood sugars 3 times a day. 100 each 3   letrozole (FEMARA) 2.5 MG tablet Take 1 tablet (2.5 mg total) by mouth daily. 90 tablet 3   levothyroxine (EUTHYROX) 200 MCG tablet Take 1 tablet (200 mcg total) by mouth daily before breakfast. 90 tablet 3   lidocaine-prilocaine (EMLA) cream Apply topically daily as needed. (Patient taking differently: Apply 1 Application topically daily as needed (prior to port access.).) 30 g 3   OneTouch Delica Lancets 33G MISC 1 each by Other route 2 (two) times daily. Use to monitor glucose levels BID; E11.65 100 each 2   RELION PEN NEEDLES 32G X 4 MM  MISC USE 1 PEN PER DAY 50 each 5   simvastatin (ZOCOR) 10 MG tablet Take 10 mg by mouth at bedtime.      No current facility-administered medications for this visit.    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 Pennie Rushing who obtained an endometrial biopsy showing poorly differentiated endometrial carcinoma and negative endocervical curettage   12/12/2017 Imaging   MAMMOGRAM FINDINGS: In the right axilla, a possible mass warrants further evaluation. In the left breast, no findings suspicious for malignancy.   Images were processed with CAD.   IMPRESSION: Further evaluation is suggested for possible mass in the right axilla.     12/24/2017 Imaging   Ct scan chest, abdomen and pelvis 1. Marked thickening of the endometrium (42 mm) compatible with known primary endometrial malignancy. No evidence of extrauterine invasion. 2. No pelvic or retroperitoneal adenopathy. 3. Right middle lobe 4 mm solid pulmonary nodule, for which follow-up chest CT is advised in 3-6 months. 4. Several findings that are equivocal for distant metastatic disease. Vaguely nodular heterogeneous hyperenhancement in the peripheral right liver lobe, which could represent benign transient perfusional phenomena, with underlying liver lesions not entirely excluded. Mildly sclerotic T12 vertebral lesion. Mildly enlarged right axillary lymph node. The best single test to further evaluate these findings would be a PET-CT. Alternative tests include bone scan or thoracic MRI without and with IV contrast for the T12 osseous lesion, MRI abdomen without and with IV contrast for the liver findings, and diagnostic mammographic evaluation for the right axillary node. 5.  Aortic Atherosclerosis (ICD10-I70.0).     01/09/2018 PET scan   1. Moderate  hypermetabolism corresponding to enlarging axillary node since 12/22/2017. Highly suspicious for an atypical distribution of metastatic disease. 2. No hypermetabolism to suggest hepatic or T12 osseous metastasis. 3. Hypermetabolic endometrial primary.   01/23/2018 Initial Diagnosis   Endometrial cancer (HCC)   01/23/2018 Pathology Results   1. Lymph node, sentinel, biopsy, left external iliac - NO CARCINOMA IDENTIFIED IN ONE LYMPH NODE (0/1) - SEE COMMENT 2. Lymph nodes, regional resection, right para aortic - NO CARCINOMA IDENTIFIED IN FOUR LYMPH NODES (0/4) - SEE COMMENT 3. Lymph nodes, regional resection, right pelvic - NO CARCINOMA IDENTIFIED IN EIGHT LYMPH NODES (0/8) - SEE COMMENT 4. Cul-de-sac biopsy - METASTATIC CARCINOMA 5. Uterus +/- tubes/ovaries, neoplastic, cervix, bilateral fallopian tubes and ovaries UTERUS: - MIXED SEROUS AND ENDOMETRIOID CARCINOMA - SEROSAL IMPLANTS PRESENT - LYMPHOVASCULAR SPACE INVASION PRESENT - LEIOMYOMATA (1.5 CM; LARGEST) - SEE ONCOLOGY TABLE AND COMMENT BELOW CERVIX: - BENIGN NABOTHIAN CYSTS - NO CARCINOMA IDENTIFIED BILATERAL OVARIES: - METASTATIC CARCINOMA PRESENT ON OVARIAN SURFACE BILATERAL FALLOPIAN TUBES: - INTRALUMINAL CARCINOMA Microscopic Comment 1. -3. Cytokeratin AE1/3 was performed on the sentinel lymph nodes to exclude micrometastasis. There is no evidence of metastatic carcinoma by immunohistochemistry. 5. 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%) Histologic Grade: N/A Myometrial invasion: Estimated less  than 50% myometrial invasion (0.3 cm of myometrium involved; 1.4 cm measured thickness) Uterine Serosa Involvement: Present Cervical stromal involvement: Not identified Extent of involvement  of other organs: - Fallopian tube (left within the lumen) - Ovary, left (surface involvement) - Cul-de-sac Lymphovascular invasion: Present Regional Lymph Nodes: Examined: 1 Sentinel 12 Non-sentinel 13 Total Lymph nodes with metastasis: 0 Isolated tumor cells (< 0.2 mm): 0 Micrometastasis: (> 0.2 mm and < 2.0 mm): 0 Macrometastasis: (> 2.0 mm): 0 Extracapsular extension: N/A Tumor block for ancillary studies: 5E, 5B MMR / MSI testing: Pending will be reported separately Pathologic Stage Classification (pTNM, AJCC 8th edition): pT3a, pN0 FIGO Stage: IIIA COMMENT: There is tumor present on the surface of the left ovary and within the lumen of the left fallopian tube. The carcinoma appears to be mixed with the largest component being high grade serous carcinoma. Dr. Colonel Bald reviewed the case and agrees with the above diagnosis.   01/23/2018 Surgery   Surgeon: Quinn Axe     Operation: Robotic-assisted laparoscopic total hysterectomy with bilateral salpingoophorectomy, SLN injection, mapping and biopsy, right pelvic and para-aortic lymphadenectomy   Operative Findings:  : 10-12cm bulky uterus with frank serosal involvement on posterior cul de sac peritoneum and anterior peritoneum which adhesed the bladder to the anterior uterus. Unilateral mapping on left pelvis. No grossly suspicious nodes. Normal omentum and diaphragms.    02/01/2018 Pathology Results   Lymph node, needle/core biopsy, right axilla - METASTATIC CARCINOMA - SEE COMMENT Microscopic Comment The neoplastic cells are positive for cytokeratin 7 and Pax-8 but negative for cytokeratin 5/6, cytokeratin 20, Gata-3, p63, p53 and GCDFP. Overall, the immunoprofile is consistent with metastasis from the patient's known gynecologic carcinoma.   02/01/2018 Procedure   Ultrasound-guided core biopsies of a suspicious right axillary lymph node.   02/21/2018 - 06/13/2018 Chemotherapy   The patient had carboplatin & Taxol x 6    03/26/2018 - 04/30/2018 Radiation Therapy   Radiation treatment dates:   03/26/18, 04/02/18, 04/19/18, 04/23/18 04/30/18   Site/dose: proximal Vagina, 6 Gy in 5 fractions for a total dose of 30 Gy     04/26/2018 PET scan   1. Interval resection of the hypermetabolic endometrial primary. No discernible hypermetabolic pelvic sidewall or peritoneal metastases. 2. Stable hypermetabolic bilateral axillary lymphadenopathy. The degree of hypermetabolism in these lymph nodes remains highly suspicious for neoplasm. 3. Interval development of low level FDG uptake in 2 stable, small lymph nodes in the left groin region. This may be reactive, but close attention recommended to exclude metastatic involvement. 4. Stable non hypermetabolic low-density liver lesions and the mixed lucent and sclerotic T12 lesion is stable without hypermetabolism today.   07/09/2018 Imaging   Status post hysterectomy.   Mild axillary lymphadenopathy, improved. Nodal metastases not excluded.   Irregular wall thickening involving the anterior bladder, possibly reflecting radiation cystitis versus tumor.   Additional stable findings as above, including a 5 mm right middle lobe nodule and stable sclerosis involving the T12 vertebral body.   10/10/2018 Imaging   1. Stable exam. No findings within the abdomen or pelvis to suggest metastatic disease. 2. Unchanged appearance of sclerosis involving the T12 vertebra. 3.  Aortic Atherosclerosis (ICD10-I70.0).   10/10/2018 Imaging   Ct abdomen and pelvis 1. Stable exam. No findings within the abdomen or pelvis to suggest metastatic disease. 2. Unchanged appearance of sclerosis involving the T12 vertebra. 3.  Aortic Atherosclerosis (ICD10-I70.0).   01/16/2019 PET scan   1. Enlargement and increased activity in the left  axillary, right axillary, and left inguinal adenopathy compatible with progressive malignancy. 2. Stable 4 mm right middle lobe subpleural nodule, not appreciably  hypermetabolic. 3. Other imaging findings of potential clinical significance: Right kidney lower pole cyst. Aortic Atherosclerosis (ICD10-I70.0). Prominent stool throughout the colon favors constipation. Mild chronic left maxillary sinusitis.     02/01/2019 -  Chemotherapy   The patient had pembrolizumab and Lenvima for chemotherapy treatment.     04/25/2019 Imaging   Ct imaging 1. Interval response to therapy. Decrease in size of bilateral axillary and left inguinal lymph nodes. No new or progressive findings identified. 2. Stable sclerotic appearance of the T12 vertebra. 3. Aortic atherosclerosis. Lad coronary artery calcification noted.   08/30/2019 Imaging   1. Bulky RIGHT hilar lymph node, slightly increased in size from previous imaging. 2. Multiple foci of hyperenhancement in the liver. 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 Consider MRI liver on follow-up to establish a baseline for number of lesions as these will appear variable on CT follow-up based on phase of contrast acquisition in the future. 3. Stable 5 mm nodule adjacent to the fissure, minor fissure in the RIGHT middle lobe.     11/28/2019 Imaging   1. Stable exam. No new or progressive findings. 2. Stable enlarged right axillary lymph node. 3. Stable tiny bilateral pulmonary nodules. Continued attention on follow-up recommended. 4. Scattered hypodensities in the liver parenchyma correspond to the hypervascular lesions seen on the previous study performed with intravenous contrast material. 5. Right renal cyst. 6. Aortic Atherosclerosis (ICD10-I70.0).     03/16/2020 Imaging   Increased echogenicity of renal parenchyma is noted bilaterally suggesting medical renal disease. No hydronephrosis or renal obstruction is noted    04/02/2020 Imaging   1. Right axillary lymphadenopathy is stable. Tiny bilateral pulmonary nodules are stable. Subcentimeter low-attenuation liver lesions are stable. 2. No new or progressive metastatic disease in the chest, abdomen or pelvis. 3. Aortic Atherosclerosis (ICD10-I70.0).   09/15/2020 Imaging   1. Stable RIGHT axillary lymph node with enlargement measuring 2 cm short axis. 2. Stable small nodule along the minor fissure in the RIGHT chest. 3. No new or progressive findings. 4. Aortic atherosclerosis.   03/24/2021 Imaging   1. Right axillary lymphadenopathy is stable compared to prior studies. 2. No other definite signs of metastatic disease elsewhere in the thorax. 3. Hepatic steatosis. Numerous hypervascular areas noted in the liver, incompletely characterized on today's arterial phase examination. These are similar to remote prior examination from 08/30/2019, likely to represent small benign lesions such as flash fill cavernous hemangiomas. These could be definitively evaluated with nonemergent abdominal MRI with and without IV gadolinium if of clinical concern. 4. Aortic atherosclerosis, in addition to left main and left anterior descending coronary artery disease. Assessment for potential risk factor modification, dietary therapy or pharmacologic therapy may be warranted, if clinically indicated.   Aortic Atherosclerosis (ICD10-I70.0).   09/16/2021 Imaging   1. Unchanged enlarged right axillary lymph node. No evidence of new lymphadenopathy or metastatic disease in the chest, abdomen, or pelvis. 2. Subcentimeter hyperenhancing lesions of the right lobe of the liver, hepatic segments VII and VIII are unchanged, measuring up to 0.7 cm. These are likely small benign incidental flash filling hemangiomata although incompletely characterized on this examination. Attention on follow-up. 3. Status post  hysterectomy. 4. Cholelithiasis. 5. Coronary artery disease.   Aortic  Atherosclerosis (ICD10-I70.0).     12/31/2021 - 03/04/2022 Chemotherapy   Patient is on Treatment Plan : UTERINE Lenvatinib (20) D1-21 + Pembrolizumab (200) D1 q21d     03/24/2022 Imaging   1. Similar to mild enlargement of an isolated right axillary nodal mass. 2. No new sites of disease identified. 3.  Possible constipation. 4. Coronary artery atherosclerosis. Aortic Atherosclerosis (ICD10-I70.0).   04/22/2022 Pathology Results   A. AXILLARY, RIGHT, LYMPH NODE:  -  1 of 3 lymph nodes positive for metastatic carcinoma, consistent with  the patient's known endometrial carcinoma (i.e. morphologically consistent with papillary serous carcinoma) with areas of geographic necrosis, negative for extracapsular extension on sections examined.    04/22/2022 Surgery   Preoperative Diagnosis: RIGHT AXILLARY LYMPHADENOPATHY    Postoperative Diagnosis: RIGHT AXILLARY LYMPHADENOPATHY    Surgical Procedure: RIGHT AXILLARY LYMPH NODE BIOPSY:     Operative Team Members:  Surgeon(s) and Role:    * Stechschulte, Hyman Hopes, MD - Primary     Specimen:  ID Type Source Tests Collected by Time Destination  1 : Right Axillary Lymph Node Tissue PATH Lymph nodes regional SURGICAL PATHOLOGY Stechschulte, Hyman Hopes, MD 04/22/2022 1040         Indications for Procedure: Krista Johnson is a 72 y.o. female who presented with right axillary lymphadenopathy. I recommended right axillary lymph node biopsy. The procedure itself as well as its risk, benefits, and alternatives were discussed the patient in full. After full discussion all questions answered the patient granted consent to proceed.    Findings: Firm enlarged right axillary lymph node    Description of Procedure:    On the date stated above the patient was taken operating room placed in supine position.  Anesthesia was induced.  A timeout was completed verifying correct patient, procedure, positioning, and equipment needed for the case.  The patient's chest and  right axilla was prepped and draped in the usual sterile fashion.   I made a incision along the hairline anterior to the axilla and dissected through the subcutaneous tissues into the axilla.  Open the axillary fascia and palpated for the lymph node.  The lymph node was dissected circumferentially.  The blood supply of the lymph node was controlled using two 2-0 Vicryl sutures.  Was passed off the field as a specimen.  The wound was irrigated and hemostasis was obtained.  The wound was closed in layers with 2-0 Vicryl suture running the deeper layers together and 4-0 Monocryl and Dermabond for the skin.  All sponge needle counts were correct at the end of the case.   07/21/2022 Imaging   CT CHEST ABDOMEN PELVIS W CONTRAST  Result Date: 07/20/2022 CLINICAL DATA:  Cervical/endometrial cancer/evaluate treatment response. Lung nodules. * Tracking Code: BO * EXAM: CT CHEST, ABDOMEN, AND PELVIS WITH CONTRAST TECHNIQUE: Multidetector CT imaging of the chest, abdomen and pelvis was performed following the standard protocol during bolus administration of intravenous contrast. RADIATION DOSE REDUCTION: This exam was performed according to the departmental dose-optimization program which includes automated exposure control, adjustment of the mA and/or kV according to patient size and/or use of iterative reconstruction technique. CONTRAST:  80mL OMNIPAQUE IOHEXOL 300 MG/ML  SOLN COMPARISON:  03/23/2022 FINDINGS: CT CHEST FINDINGS Cardiovascular: Right Port-A-Cath tip superior caval/atrial junction. Aortic atherosclerosis. Tortuous thoracic aorta. Mild cardiomegaly, without pericardial effusion. Lad coronary artery calcification. No central pulmonary embolism, on this non-dedicated study. Mediastinum/Nodes: No supraclavicular adenopathy. Resolution of right  axillary adenopathy. No mediastinal or hilar adenopathy. Lungs/Pleura: No pleural fluid. A Perifissural right middle lobe 4 mm pulmonary nodule on 67/5 is unchanged and  likely a subpleural lymph node. Left upper lobe 4-5 mm ground-glass nodule is unchanged on 39/5. Musculoskeletal: No acute osseous abnormality. 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 No hypermetabolism on prior PET. CT ABDOMEN PELVIS FINDINGS Hepatobiliary: Normal liver. Normal gallbladder, without biliary ductal dilatation. Pancreas: Normal, without mass or ductal dilatation. Spleen: Normal in size, without focal abnormality. Adrenals/Urinary Tract: Normal adrenal glands. Normal left kidney. Exophytic inter/lower pole right renal 3.7 cm cyst . In the absence of clinically indicated signs/symptoms require(s) no independent follow-up. No hydronephrosis. Normal urinary bladder. Stomach/Bowel: Gastric antral underdistention. Colonic stool burden suggests constipation. Normal appendix. Normal small bowel. Vascular/Lymphatic: Aortic atherosclerosis. Small abdominal retroperitoneal nodes are similar, not pathologic by size criteria. No abdominopelvic adenopathy. Reproductive: Hysterectomy.  No adnexal mass. Other: No significant free fluid. No free intraperitoneal air. No evidence of omental or peritoneal disease. Musculoskeletal: Osteopenia. Trace L4-5 anterolisthesis with prominent disc bulges at L3-4 through L5-S1. IMPRESSION: 1. Resolution of right axillary adenopathy. 2. No evidence of residual metastatic disease. 3.  Possible constipation. 4. Coronary artery atherosclerosis. Aortic Atherosclerosis (ICD10-I70.0). Electronically Signed   By: Jeronimo Greaves M.D.   On: 07/20/2022 16:17        PHYSICAL EXAMINATION: ECOG PERFORMANCE STATUS: 0 - Asymptomatic  Vitals:   09/23/22 0916  BP: (!) 151/84  Pulse: 84  Resp: 18  Temp: 97.8 F (36.6 C)  SpO2: 100%   Filed Weights   09/23/22 0916  Weight: 171 lb 12.8 oz (77.9 kg)    GENERAL:alert, no distress and comfortable NEURO: alert & oriented x 3 with fluent speech, no focal motor/sensory  deficits  LABORATORY DATA:  I have reviewed the data as listed    Component Value Date/Time   NA 142 07/20/2022 0937   K 4.5 07/20/2022 0937   CL 114 (H) 07/20/2022 0937   CO2 24 07/20/2022 0937   GLUCOSE 119 (H) 07/20/2022 0937   BUN 37 (H) 07/20/2022 0937   CREATININE 1.36 (H) 07/20/2022 0937   CREATININE 1.05 (H) 04/05/2022 1420   CALCIUM 10.3 07/20/2022 0937   PROT 6.4 (L) 07/20/2022 0937   ALBUMIN 3.8 07/20/2022 0937   AST 18 07/20/2022 0937   AST 23 04/05/2022 1420   ALT 24 07/20/2022 0937   ALT 26 04/05/2022 1420   ALKPHOS 86 07/20/2022 0937   BILITOT 0.5 07/20/2022 0937   BILITOT 0.5 04/05/2022 1420   GFRNONAA 42 (L) 07/20/2022 0937   GFRNONAA 57 (L) 04/05/2022 1420   GFRAA 34 (L) 01/10/2020 1200    No results found for: "SPEP", "UPEP"  Lab Results  Component Value Date   WBC 3.9 (L) 09/23/2022   NEUTROABS 2.3 09/23/2022   HGB 11.4 (L) 09/23/2022   HCT 35.0 (L) 09/23/2022   MCV 85.8 09/23/2022   PLT 175 09/23/2022      Chemistry      Component Value Date/Time   NA 142 07/20/2022 0937   K 4.5 07/20/2022 0937   CL 114 (H) 07/20/2022 0937   CO2 24 07/20/2022 0937   BUN 37 (H) 07/20/2022 0937   CREATININE 1.36 (H) 07/20/2022 0937   CREATININE 1.05 (H) 04/05/2022 1420      Component Value Date/Time   CALCIUM 10.3 07/20/2022 0937   ALKPHOS 86 07/20/2022 0937   AST 18 07/20/2022 0937  AST 23 04/05/2022 1420   ALT 24 07/20/2022 0937   ALT 26 04/05/2022 1420   BILITOT 0.5 07/20/2022 0937   BILITOT 0.5 04/05/2022 1420

## 2022-09-27 ENCOUNTER — Ambulatory Visit (INDEPENDENT_AMBULATORY_CARE_PROVIDER_SITE_OTHER): Payer: Medicare Other | Admitting: Podiatry

## 2022-09-27 ENCOUNTER — Encounter: Payer: Self-pay | Admitting: Podiatry

## 2022-09-27 VITALS — BP 161/94 | HR 78 | Temp 97.2°F

## 2022-09-27 DIAGNOSIS — Z794 Long term (current) use of insulin: Secondary | ICD-10-CM

## 2022-09-27 DIAGNOSIS — M1990 Unspecified osteoarthritis, unspecified site: Secondary | ICD-10-CM | POA: Insufficient documentation

## 2022-09-27 DIAGNOSIS — B351 Tinea unguium: Secondary | ICD-10-CM | POA: Diagnosis not present

## 2022-09-27 DIAGNOSIS — N183 Chronic kidney disease, stage 3 unspecified: Secondary | ICD-10-CM | POA: Diagnosis not present

## 2022-09-27 DIAGNOSIS — E0822 Diabetes mellitus due to underlying condition with diabetic chronic kidney disease: Secondary | ICD-10-CM | POA: Diagnosis not present

## 2022-09-27 DIAGNOSIS — T451X5A Adverse effect of antineoplastic and immunosuppressive drugs, initial encounter: Secondary | ICD-10-CM

## 2022-09-27 DIAGNOSIS — M79674 Pain in right toe(s): Secondary | ICD-10-CM | POA: Diagnosis not present

## 2022-09-27 DIAGNOSIS — M79675 Pain in left toe(s): Secondary | ICD-10-CM | POA: Diagnosis not present

## 2022-09-27 DIAGNOSIS — G620 Drug-induced polyneuropathy: Secondary | ICD-10-CM | POA: Diagnosis not present

## 2022-09-27 NOTE — Progress Notes (Signed)
  Subjective:  Patient ID: Krista Johnson, female    DOB: 1951/04/02,  MRN: [id number]  Krista Johnson presents to clinic today for at risk foot care. Pt has h/o NIDDM with chronic kidney disease. Patient states she has been applying Bag Balm Cream to her feet and her calluses have resolved. Chief Complaint  Patient presents with   Routine diabetic foot care    Bilateral foot tingling "feels like foot is itching on the inside"    New problem(s): None.   PCP is [name], MD.  Allergies  Allergen Reactions   Sulfa Antibiotics Nausea Only   Sulfamethoxazole Nausea Only    Review of Systems: Negative except as noted in the HPI. Objective:   Constitutional Krista Johnson is a pleasant 72 y.o. female, WD, WN in NAD. AAO x 3.   Vascular Vascular Examination: Capillary refill time immediate b/l. Vascular status intact b/l with palpable pedal pulses. Pedal hair absent. No edema. No pain with calf compression b/l. Skin temperature gradient WNL b/l. No cyanosis or clubbing b/l.   Neurological Examination: Sensation grossly intact b/l with 10 gram monofilament. Vibratory sensation intact b/l. Pt has subjective symptoms of neuropathy.  Dermatological Examination: Pedal skin with normal turgor, texture and tone b/l.  No open wounds. No interdigital macerations.   Toenails 1-5 b/l thick, discolored, elongated with subungual debris and pain on dorsal palpation.   No hyperkeratotic nor porokeratotic lesions present on today's visit.  Musculoskeletal Examination: Muscle strength 5/5 to all lower extremity muscle groups bilaterally. Hammertoe deformity noted 2-5 b/l.  Radiographs: None  Last A1c:      08/09/2022   12:00 AM 04/14/2022    9:00 AM 10/06/2021   10:37 AM  Hemoglobin A1C  Hemoglobin-A1c 6.8     6.6  5.8      This result is from an external source.     Assessment:   1. Pain due to onychomycosis of toenails of both feet   2. Chemotherapy-induced neuropathy (HCC)    3. Diabetes mellitus due to underlying condition with stage 3 chronic kidney disease, with long-term current use of insulin, unspecified whether stage 3a or 3b CKD (HCC)    Plan:  Patient was evaluated and treated and all questions answered. Consent given for treatment as described below: -Continue foot and shoe inspections daily. Monitor blood glucose per PCP/Endocrinologist's recommendations. -Continue supportive shoe gear daily. -Toenails 1-5 b/l were debrided in length and girth with sterile nail nippers and dremel without iatrogenic bleeding.  -Patient/POA to call should there be question/concern in the interim.  Return in about 3 months (around 12/28/2022).  Freddie Breech, DPM

## 2022-09-28 DIAGNOSIS — M1711 Unilateral primary osteoarthritis, right knee: Secondary | ICD-10-CM | POA: Diagnosis not present

## 2022-09-28 DIAGNOSIS — M25661 Stiffness of right knee, not elsewhere classified: Secondary | ICD-10-CM | POA: Diagnosis not present

## 2022-09-28 DIAGNOSIS — G8929 Other chronic pain: Secondary | ICD-10-CM | POA: Diagnosis not present

## 2022-09-28 DIAGNOSIS — M25611 Stiffness of right shoulder, not elsewhere classified: Secondary | ICD-10-CM | POA: Diagnosis not present

## 2022-09-28 DIAGNOSIS — M25561 Pain in right knee: Secondary | ICD-10-CM | POA: Diagnosis not present

## 2022-10-06 DIAGNOSIS — M25661 Stiffness of right knee, not elsewhere classified: Secondary | ICD-10-CM | POA: Diagnosis not present

## 2022-10-06 DIAGNOSIS — M25611 Stiffness of right shoulder, not elsewhere classified: Secondary | ICD-10-CM | POA: Diagnosis not present

## 2022-10-06 DIAGNOSIS — G8929 Other chronic pain: Secondary | ICD-10-CM | POA: Diagnosis not present

## 2022-10-06 DIAGNOSIS — M1711 Unilateral primary osteoarthritis, right knee: Secondary | ICD-10-CM | POA: Diagnosis not present

## 2022-10-06 DIAGNOSIS — M25561 Pain in right knee: Secondary | ICD-10-CM | POA: Diagnosis not present

## 2022-11-04 DIAGNOSIS — N1831 Chronic kidney disease, stage 3a: Secondary | ICD-10-CM | POA: Diagnosis not present

## 2022-11-04 DIAGNOSIS — I10 Essential (primary) hypertension: Secondary | ICD-10-CM | POA: Diagnosis not present

## 2022-11-04 DIAGNOSIS — E1169 Type 2 diabetes mellitus with other specified complication: Secondary | ICD-10-CM | POA: Diagnosis not present

## 2022-11-18 ENCOUNTER — Inpatient Hospital Stay (HOSPITAL_BASED_OUTPATIENT_CLINIC_OR_DEPARTMENT_OTHER): Payer: Medicare Other | Admitting: Hematology and Oncology

## 2022-11-18 ENCOUNTER — Inpatient Hospital Stay: Payer: Medicare Other | Attending: Gynecology

## 2022-11-18 ENCOUNTER — Other Ambulatory Visit: Payer: Self-pay

## 2022-11-18 ENCOUNTER — Encounter: Payer: Self-pay | Admitting: Hematology and Oncology

## 2022-11-18 VITALS — BP 142/66 | HR 83 | Temp 97.7°F | Resp 18 | Ht 66.0 in | Wt 179.4 lb

## 2022-11-18 DIAGNOSIS — R590 Localized enlarged lymph nodes: Secondary | ICD-10-CM | POA: Diagnosis not present

## 2022-11-18 DIAGNOSIS — D638 Anemia in other chronic diseases classified elsewhere: Secondary | ICD-10-CM | POA: Diagnosis not present

## 2022-11-18 DIAGNOSIS — R918 Other nonspecific abnormal finding of lung field: Secondary | ICD-10-CM | POA: Insufficient documentation

## 2022-11-18 DIAGNOSIS — Z79899 Other long term (current) drug therapy: Secondary | ICD-10-CM | POA: Diagnosis not present

## 2022-11-18 DIAGNOSIS — C773 Secondary and unspecified malignant neoplasm of axilla and upper limb lymph nodes: Secondary | ICD-10-CM

## 2022-11-18 DIAGNOSIS — C541 Malignant neoplasm of endometrium: Secondary | ICD-10-CM | POA: Diagnosis not present

## 2022-11-18 DIAGNOSIS — D649 Anemia, unspecified: Secondary | ICD-10-CM | POA: Diagnosis not present

## 2022-11-18 DIAGNOSIS — E039 Hypothyroidism, unspecified: Secondary | ICD-10-CM

## 2022-11-18 LAB — CBC WITH DIFFERENTIAL/PLATELET
Abs Immature Granulocytes: 0.01 10*3/uL (ref 0.00–0.07)
Basophils Absolute: 0 10*3/uL (ref 0.0–0.1)
Basophils Relative: 0 %
Eosinophils Absolute: 0.1 10*3/uL (ref 0.0–0.5)
Eosinophils Relative: 2 %
HCT: 34.4 % — ABNORMAL LOW (ref 36.0–46.0)
Hemoglobin: 11.3 g/dL — ABNORMAL LOW (ref 12.0–15.0)
Immature Granulocytes: 0 %
Lymphocytes Relative: 31 %
Lymphs Abs: 1.3 10*3/uL (ref 0.7–4.0)
MCH: 28 pg (ref 26.0–34.0)
MCHC: 32.8 g/dL (ref 30.0–36.0)
MCV: 85.4 fL (ref 80.0–100.0)
Monocytes Absolute: 0.3 10*3/uL (ref 0.1–1.0)
Monocytes Relative: 7 %
Neutro Abs: 2.6 10*3/uL (ref 1.7–7.7)
Neutrophils Relative %: 60 %
Platelets: 157 10*3/uL (ref 150–400)
RBC: 4.03 MIL/uL (ref 3.87–5.11)
RDW: 13.4 % (ref 11.5–15.5)
WBC: 4.3 10*3/uL (ref 4.0–10.5)
nRBC: 0 % (ref 0.0–0.2)

## 2022-11-18 LAB — COMPREHENSIVE METABOLIC PANEL
ALT: 35 U/L (ref 0–44)
AST: 25 U/L (ref 15–41)
Albumin: 3.9 g/dL (ref 3.5–5.0)
Alkaline Phosphatase: 98 U/L (ref 38–126)
Anion gap: 7 (ref 5–15)
BUN: 38 mg/dL — ABNORMAL HIGH (ref 8–23)
CO2: 22 mmol/L (ref 22–32)
Calcium: 10 mg/dL (ref 8.9–10.3)
Chloride: 111 mmol/L (ref 98–111)
Creatinine, Ser: 1.43 mg/dL — ABNORMAL HIGH (ref 0.44–1.00)
GFR, Estimated: 39 mL/min — ABNORMAL LOW (ref >60)
Glucose, Bld: 160 mg/dL — ABNORMAL HIGH (ref 70–99)
Potassium: 4.2 mmol/L (ref 3.5–5.1)
Sodium: 140 mmol/L (ref 135–145)
Total Bilirubin: 0.5 mg/dL (ref 0.3–1.2)
Total Protein: 6.6 g/dL (ref 6.5–8.1)

## 2022-11-18 LAB — TSH: TSH: <0.01 u[IU]/mL — ABNORMAL LOW (ref 0.350–4.500)

## 2022-11-18 MED ORDER — SODIUM CHLORIDE 0.9% FLUSH
10.0000 mL | Freq: Once | INTRAVENOUS | Status: AC
  Administered 2022-11-18: 10 mL

## 2022-11-18 MED ORDER — HEPARIN SOD (PORK) LOCK FLUSH 100 UNIT/ML IV SOLN
500.0000 [IU] | Freq: Once | INTRAVENOUS | Status: AC
  Administered 2022-11-18: 500 [IU]

## 2022-11-18 NOTE — Assessment & Plan Note (Signed)
She tolerated antiestrogen therapy well Her examination is benign We will continue port flush maintains appointment every 2 months I plan to repeat imaging study in 6 months, due around end of September

## 2022-11-18 NOTE — Progress Notes (Signed)
[facility] OFFICE PROGRESS NOTE  Patient Care Team: [name], MD as PCP - General (Family Medicine)  ASSESSMENT & PLAN:  Endometrial cancer [facility]) She tolerated antiestrogen therapy well Her examination is benign We will continue port flush maintains appointment every 2 months I plan to repeat imaging study in 6 months, due around end of September  Anemia, chronic disease This is likely multifactorial related to anemia chronic disease It is not likely due to her treatment She is not symptomatic.  Observe only.    Multiple lung nodules on CT I will repeat CT imaging of the chest to follow  Orders Placed This Encounter  Procedures   CT CHEST ABDOMEN PELVIS W CONTRAST    Standing Status:   Future    Standing Expiration Date:   11/18/2023    Scheduling Instructions:     No need oral contrast    Order Specific Question:   If indicated for the ordered procedure, I authorize the administration of contrast media per Radiology protocol    Answer:   Yes    Order Specific Question:   Does the patient have a contrast media/X-ray dye allergy?    Answer:   No    Order Specific Question:   Preferred imaging location?    Answer:   [facility]    Order Specific Question:   If indicated for the ordered procedure, I authorize the administration of oral contrast media per Radiology protocol    Answer:   Yes    All questions were answered. The patient knows to call the clinic with any problems, questions or concerns. The total time spent in the appointment was 30 minutes encounter with patients including review of chart and various tests results, discussions about plan of care and coordination of care plan   Artis Delay, MD 11/18/2022 9:23 AM  INTERVAL HISTORY: Please see below for problem oriented charting. she returns for treatment follow-up  She tolerated antiestrogen therapy well Denies major hot flashes No new lymphadenopathy Denies changes in bowel  habits  REVIEW OF SYSTEMS:   Constitutional: Denies fevers, chills or abnormal weight loss Eyes: Denies blurriness of vision Ears, nose, mouth, throat, and face: Denies mucositis or sore throat Respiratory: Denies cough, dyspnea or wheezes Cardiovascular: Denies palpitation, chest discomfort or lower extremity swelling Gastrointestinal:  Denies nausea, heartburn or change in bowel habits Skin: Denies abnormal skin rashes Lymphatics: Denies new lymphadenopathy or easy bruising Neurological:Denies numbness, tingling or new weaknesses Behavioral/Psych: Mood is stable, no new changes  All other systems were reviewed with the patient and are negative.  I have reviewed the past medical history, past surgical history, social history and family history with the patient and they are unchanged from previous note.  ALLERGIES:  is allergic to sulfa antibiotics and sulfamethoxazole.  MEDICATIONS:  Current Outpatient Medications  Medication Sig Dispense Refill   glipiZIDE (GLUCOTROL XL) 2.5 MG 24 hr tablet Take 2.5 mg by mouth daily with breakfast.     amLODipine-valsartan (EXFORGE) 5-160 MG tablet Take 1 tablet by mouth daily after breakfast.     Azelastine HCl 0.15 % SOLN Place 1-2 sprays into both nostrils daily as needed (sinus congestion/issues.).     calcium elemental as carbonate (BARIATRIC TUMS ULTRA) 400 MG chewable tablet Chew 2 tablets by mouth daily in the afternoon.     diphenhydrAMINE (BENADRYL) 25 MG tablet Take 25 mg by mouth daily as needed (runny nose/sinus issues.).     ergocalciferol (VITAMIN D2) 1.25 MG (50000  UT) capsule Take 1 capsule (50,000 Units total) by mouth once a week. (Patient taking differently: Take 50,000 Units by mouth 2 (two) times a week. Wednesdays & Saturdays.) 25 capsule 1   fexofenadine (ALLEGRA) 180 MG tablet Take 180 mg by mouth daily as needed for allergies or rhinitis.     glucose blood (ONETOUCH VERIO) test strip Use as instructed to check blood sugars 3  times a day. 100 each 3   letrozole (FEMARA) 2.5 MG tablet Take 1 tablet (2.5 mg total) by mouth daily. 90 tablet 3   levothyroxine (EUTHYROX) 150 MCG tablet Take 1 tablet (150 mcg total) by mouth daily before breakfast. 30 tablet 3   lidocaine-prilocaine (EMLA) cream Apply topically daily as needed. (Patient taking differently: Apply 1 Application topically daily as needed (prior to port access.).) 30 g 3   OneTouch Delica Lancets 33G MISC 1 each by Other route 2 (two) times daily. Use to monitor glucose levels BID; E11.65 100 each 2   RELION PEN NEEDLES 32G X 4 MM MISC USE 1 PEN PER DAY 50 each 5   simvastatin (ZOCOR) 10 MG tablet Take 10 mg by mouth at bedtime.      spironolactone (ALDACTONE) 25 MG tablet Take 25 mg by mouth as needed.     No current facility-administered medications for this visit.    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 Pennie Rushing who obtained an endometrial biopsy showing poorly differentiated endometrial carcinoma and negative endocervical curettage   12/12/2017 Imaging   MAMMOGRAM FINDINGS: In the right axilla, a possible mass warrants further evaluation. In the left breast, no findings suspicious for malignancy.   Images were processed with CAD.   IMPRESSION: Further evaluation is suggested for possible mass in the right axilla.     12/24/2017 Imaging   Ct scan chest, abdomen and pelvis 1. Marked thickening of the endometrium (42 mm) compatible with known primary endometrial malignancy. No evidence of  extrauterine invasion. 2. No pelvic or retroperitoneal adenopathy. 3. Right middle lobe 4 mm solid pulmonary nodule, for which follow-up chest CT is advised in 3-6 months. 4. Several findings that are equivocal for distant metastatic disease. Vaguely nodular heterogeneous hyperenhancement in the peripheral right liver lobe, which could represent benign transient perfusional phenomena, with underlying liver lesions not entirely excluded. Mildly sclerotic T12 vertebral lesion. Mildly enlarged right axillary lymph node. The best single test to further evaluate these findings would be a PET-CT. Alternative tests include bone scan or thoracic MRI without and with IV contrast for the T12 osseous lesion, MRI abdomen without and with IV contrast for the liver findings, and diagnostic mammographic evaluation for the right axillary node. 5.  Aortic Atherosclerosis (ICD10-I70.0).     01/09/2018 PET scan   1. Moderate hypermetabolism corresponding to enlarging axillary node since 12/22/2017. Highly suspicious for an atypical distribution of metastatic disease. 2. No hypermetabolism to suggest hepatic or T12 osseous metastasis. 3. Hypermetabolic endometrial primary.   01/23/2018 Initial Diagnosis   Endometrial cancer (HCC)   01/23/2018 Pathology Results   1.  Lymph node, sentinel, biopsy, left external iliac - NO CARCINOMA IDENTIFIED IN ONE LYMPH NODE (0/1) - SEE COMMENT 2. Lymph nodes, regional resection, right para aortic - NO CARCINOMA IDENTIFIED IN FOUR LYMPH NODES (0/4) - SEE COMMENT 3. Lymph nodes, regional resection, right pelvic - NO CARCINOMA IDENTIFIED IN EIGHT LYMPH NODES (0/8) - SEE COMMENT 4. Cul-de-sac biopsy - METASTATIC CARCINOMA 5. Uterus +/- tubes/ovaries, neoplastic, cervix, bilateral fallopian tubes and ovaries UTERUS: - MIXED SEROUS AND ENDOMETRIOID CARCINOMA - SEROSAL IMPLANTS PRESENT - LYMPHOVASCULAR SPACE INVASION PRESENT - LEIOMYOMATA (1.5 CM; LARGEST) - SEE ONCOLOGY TABLE  AND COMMENT BELOW CERVIX: - BENIGN NABOTHIAN CYSTS - NO CARCINOMA IDENTIFIED BILATERAL OVARIES: - METASTATIC CARCINOMA PRESENT ON OVARIAN SURFACE BILATERAL FALLOPIAN TUBES: - INTRALUMINAL CARCINOMA Microscopic Comment 1. -3. Cytokeratin AE1/3 was performed on the sentinel lymph nodes to exclude micrometastasis. There is no evidence of metastatic carcinoma by immunohistochemistry. 5. 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%) Histologic Grade: N/A Myometrial invasion: Estimated less than 50% myometrial invasion (0.3 cm of myometrium involved; 1.4 cm measured thickness) Uterine Serosa Involvement: Present Cervical stromal involvement: Not identified Extent of involvement of other organs: - Fallopian tube (left within the lumen) - Ovary, left (surface involvement) - Cul-de-sac Lymphovascular invasion: Present Regional Lymph Nodes: Examined: 1 Sentinel 12 Non-sentinel 13 Total Lymph nodes with metastasis: 0 Isolated tumor cells (< 0.2 mm): 0 Micrometastasis: (> 0.2 mm and < 2.0 mm): 0 Macrometastasis: (> 2.0 mm): 0 Extracapsular extension: N/A Tumor block for ancillary studies: 5E, 5B MMR / MSI testing: Pending will be reported separately Pathologic Stage Classification (pTNM, AJCC 8th edition): pT3a, pN0 FIGO Stage: IIIA COMMENT: There is tumor present on the surface of the left ovary and within the lumen of the left fallopian tube. The carcinoma appears to be mixed with the largest component being high grade serous carcinoma. Dr. Colonel Bald reviewed the case and agrees with the above diagnosis.   01/23/2018 Surgery   Surgeon: Quinn Axe     Operation: Robotic-assisted laparoscopic total hysterectomy with bilateral salpingoophorectomy, SLN  injection, mapping and biopsy, right pelvic and para-aortic lymphadenectomy   Operative Findings:  : 10-12cm bulky uterus with frank serosal involvement on posterior cul de sac peritoneum and anterior peritoneum which adhesed the bladder to the anterior uterus. Unilateral mapping on left pelvis. No grossly suspicious nodes. Normal omentum and diaphragms.    02/01/2018 Pathology Results   Lymph node, needle/core biopsy, right axilla - METASTATIC CARCINOMA - SEE COMMENT Microscopic Comment The neoplastic cells are positive for cytokeratin 7 and Pax-8 but negative for cytokeratin 5/6, cytokeratin 20, Gata-3, p63, p53 and GCDFP. Overall, the immunoprofile is consistent with metastasis from the patient's known gynecologic carcinoma.   02/01/2018 Procedure   Ultrasound-guided core biopsies of a suspicious right axillary lymph node.   02/21/2018 - 06/13/2018 Chemotherapy   The patient had carboplatin & Taxol x 6   03/26/2018 - 04/30/2018 Radiation Therapy   Radiation treatment dates:   03/26/18, 04/02/18, 04/19/18, 04/23/18 04/30/18   Site/dose: proximal Vagina, 6 Gy in 5 fractions for a total dose of 30 Gy     04/26/2018 PET scan   1. Interval resection of the hypermetabolic endometrial primary. No discernible hypermetabolic pelvic sidewall or peritoneal metastases. 2. Stable hypermetabolic bilateral axillary lymphadenopathy. The degree of hypermetabolism in these lymph nodes remains highly suspicious for neoplasm. 3. Interval  development of low level FDG uptake in 2 stable, small lymph nodes in the left groin region. This may be reactive, but close attention recommended to exclude metastatic involvement. 4. Stable non hypermetabolic low-density liver lesions and the mixed lucent and sclerotic T12 lesion is stable without hypermetabolism today.   07/09/2018 Imaging   Status post hysterectomy.   Mild axillary lymphadenopathy, improved. Nodal metastases not excluded.   Irregular wall thickening  involving the anterior bladder, possibly reflecting radiation cystitis versus tumor.   Additional stable findings as above, including a 5 mm right middle lobe nodule and stable sclerosis involving the T12 vertebral body.   10/10/2018 Imaging   1. Stable exam. No findings within the abdomen or pelvis to suggest metastatic disease. 2. Unchanged appearance of sclerosis involving the T12 vertebra. 3.  Aortic Atherosclerosis (ICD10-I70.0).   10/10/2018 Imaging   Ct abdomen and pelvis 1. Stable exam. No findings within the abdomen or pelvis to suggest metastatic disease. 2. Unchanged appearance of sclerosis involving the T12 vertebra. 3.  Aortic Atherosclerosis (ICD10-I70.0).   01/16/2019 PET scan   1. Enlargement and increased activity in the left axillary, right axillary, and left inguinal adenopathy compatible with progressive malignancy. 2. Stable 4 mm right middle lobe subpleural nodule, not appreciably hypermetabolic. 3. Other imaging findings of potential clinical significance: Right kidney lower pole cyst. Aortic Atherosclerosis (ICD10-I70.0). Prominent stool throughout the colon favors constipation. Mild chronic left maxillary sinusitis.     02/01/2019 -  Chemotherapy   The patient had pembrolizumab and Lenvima for chemotherapy treatment.     04/25/2019 Imaging   Ct imaging 1. Interval response to therapy. Decrease in size of bilateral axillary and left inguinal lymph nodes. No new or progressive findings identified. 2. Stable sclerotic appearance of the T12 vertebra. 3. Aortic atherosclerosis. Lad coronary artery calcification noted.   08/30/2019 Imaging   1. Bulky RIGHT hilar lymph node, slightly increased in size from previous imaging. 2. Multiple foci of hyperenhancement in the liver. The study is closer to in arterial phase on today's exam in these appear more numerous in the most recent prior, but more similar to the study of July 09, 2018 suspect that these represent flash fill  hemangiomata but they are quite numerous. Consider MRI liver on follow-up to establish a baseline for number of lesions as these will appear variable on CT follow-up based on phase of contrast acquisition in the future. 3. Stable 5 mm nodule adjacent to the fissure, minor fissure in the RIGHT middle lobe.     11/28/2019 Imaging   1. Stable exam. No new or progressive findings. 2. Stable enlarged right axillary lymph node. 3. Stable tiny bilateral pulmonary nodules. Continued attention on follow-up recommended. 4. Scattered hypodensities in the liver parenchyma correspond to the hypervascular lesions seen on the previous study performed with intravenous contrast material. 5. Right renal cyst. 6. Aortic Atherosclerosis (ICD10-I70.0).     03/16/2020 Imaging   Increased echogenicity of renal parenchyma is noted bilaterally suggesting medical renal disease. No hydronephrosis or renal obstruction is noted   04/02/2020 Imaging   1. Right axillary lymphadenopathy is stable. Tiny bilateral pulmonary nodules are stable. Subcentimeter low-attenuation liver lesions are stable. 2. No new or progressive metastatic disease in the chest, abdomen or pelvis. 3. Aortic Atherosclerosis (ICD10-I70.0).   09/15/2020 Imaging   1. Stable RIGHT axillary lymph node with enlargement measuring 2 cm short axis. 2. Stable small nodule along the minor fissure in the RIGHT chest. 3. No new or progressive findings. 4. Aortic  atherosclerosis.   03/24/2021 Imaging   1. Right axillary lymphadenopathy is stable compared to prior studies. 2. No other definite signs of metastatic disease elsewhere in the thorax. 3. Hepatic steatosis. Numerous hypervascular areas noted in the liver, incompletely characterized on today's arterial phase examination. These are similar to remote prior examination from 08/30/2019, likely to represent small benign lesions such as flash fill cavernous hemangiomas. These could be definitively evaluated  with nonemergent abdominal MRI with and without IV gadolinium if of clinical concern. 4. Aortic atherosclerosis, in addition to left main and left anterior descending coronary artery disease. Assessment for potential risk factor modification, dietary therapy or pharmacologic therapy may be warranted, if clinically indicated.   Aortic Atherosclerosis (ICD10-I70.0).   09/16/2021 Imaging   1. Unchanged enlarged right axillary lymph node. No evidence of new lymphadenopathy or metastatic disease in the chest, abdomen, or pelvis. 2. Subcentimeter hyperenhancing lesions of the right lobe of the liver, hepatic segments VII and VIII are unchanged, measuring up to 0.7 cm. These are likely small benign incidental flash filling hemangiomata although incompletely characterized on this examination. Attention on follow-up. 3. Status post hysterectomy. 4. Cholelithiasis. 5. Coronary artery disease.   Aortic Atherosclerosis (ICD10-I70.0).     12/31/2021 - 03/04/2022 Chemotherapy   Patient is on Treatment Plan : UTERINE Lenvatinib (20) D1-21 + Pembrolizumab (200) D1 q21d     03/24/2022 Imaging   1. Similar to mild enlargement of an isolated right axillary nodal mass. 2. No new sites of disease identified. 3.  Possible constipation. 4. Coronary artery atherosclerosis. Aortic Atherosclerosis (ICD10-I70.0).   04/22/2022 Pathology Results   A. AXILLARY, RIGHT, LYMPH NODE:  -  1 of 3 lymph nodes positive for metastatic carcinoma, consistent with  the patient's known endometrial carcinoma (i.e. morphologically consistent with papillary serous carcinoma) with areas of geographic necrosis, negative for extracapsular extension on sections examined.    04/22/2022 Surgery   Preoperative Diagnosis: RIGHT AXILLARY LYMPHADENOPATHY    Postoperative Diagnosis: RIGHT AXILLARY LYMPHADENOPATHY    Surgical Procedure: RIGHT AXILLARY LYMPH NODE BIOPSY:     Operative Team Members:  Surgeon(s) and Role:    * Stechschulte, Hyman Hopes, MD - Primary     Specimen:  ID Type Source Tests Collected by Time Destination  1 : Right Axillary Lymph Node Tissue PATH Lymph nodes regional SURGICAL PATHOLOGY Stechschulte, Hyman Hopes, MD 04/22/2022 1040         Indications for Procedure: Krista Johnson is a 72 y.o. female who presented with right axillary lymphadenopathy. I recommended right axillary lymph node biopsy. The procedure itself as well as its risk, benefits, and alternatives were discussed the patient in full. After full discussion all questions answered the patient granted consent to proceed.    Findings: Firm enlarged right axillary lymph node    Description of Procedure:    On the date stated above the patient was taken operating room placed in supine position.  Anesthesia was induced.  A timeout was completed verifying correct patient, procedure, positioning, and equipment needed for the case.  The patient's chest and right axilla was prepped and draped in the usual sterile fashion.   I made a incision along the hairline anterior to the axilla and dissected through the subcutaneous tissues into the axilla.  Open the axillary fascia and palpated for the lymph node.  The lymph node was dissected circumferentially.  The blood supply of the lymph node was controlled using two 2-0 Vicryl sutures.  Was passed off the field as a  specimen.  The wound was irrigated and hemostasis was obtained.  The wound was closed in layers with 2-0 Vicryl suture running the deeper layers together and 4-0 Monocryl and Dermabond for the skin.  All sponge needle counts were correct at the end of the case.   07/21/2022 Imaging   CT CHEST ABDOMEN PELVIS W CONTRAST  Result Date: 07/20/2022 CLINICAL DATA:  Cervical/endometrial cancer/evaluate treatment response. Lung nodules. * Tracking Code: BO * EXAM: CT CHEST, ABDOMEN, AND PELVIS WITH CONTRAST TECHNIQUE: Multidetector CT imaging of the chest, abdomen and pelvis was performed following the standard protocol  during bolus administration of intravenous contrast. RADIATION DOSE REDUCTION: This exam was performed according to the departmental dose-optimization program which includes automated exposure control, adjustment of the mA and/or kV according to patient size and/or use of iterative reconstruction technique. CONTRAST:  80mL OMNIPAQUE IOHEXOL 300 MG/ML  SOLN COMPARISON:  03/23/2022 FINDINGS: CT CHEST FINDINGS Cardiovascular: Right Port-A-Cath tip superior caval/atrial junction. Aortic atherosclerosis. Tortuous thoracic aorta. Mild cardiomegaly, without pericardial effusion. Lad coronary artery calcification. No central pulmonary embolism, on this non-dedicated study. Mediastinum/Nodes: No supraclavicular adenopathy. Resolution of right axillary adenopathy. No mediastinal or hilar adenopathy. Lungs/Pleura: No pleural fluid. A Perifissural right middle lobe 4 mm pulmonary nodule on 67/5 is unchanged and likely a subpleural lymph node. Left upper lobe 4-5 mm ground-glass nodule is unchanged on 39/5. Musculoskeletal: No acute osseous abnormality. 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 No hypermetabolism on prior PET. CT ABDOMEN PELVIS FINDINGS Hepatobiliary: Normal liver. Normal gallbladder, without biliary ductal dilatation. Pancreas: Normal, without mass or ductal dilatation. Spleen: Normal in size, without focal abnormality. Adrenals/Urinary Tract: Normal adrenal glands. Normal left kidney. Exophytic inter/lower pole right renal 3.7 cm cyst . In the absence of clinically indicated signs/symptoms require(s) no independent follow-up. No hydronephrosis. Normal urinary bladder. Stomach/Bowel: Gastric antral underdistention. Colonic stool burden suggests constipation. Normal appendix. Normal small bowel. Vascular/Lymphatic: Aortic atherosclerosis. Small abdominal retroperitoneal nodes are similar, not pathologic by size criteria. No abdominopelvic  adenopathy. Reproductive: Hysterectomy.  No adnexal mass. Other: No significant free fluid. No free intraperitoneal air. No evidence of omental or peritoneal disease. Musculoskeletal: Osteopenia. Trace L4-5 anterolisthesis with prominent disc bulges at L3-4 through L5-S1. IMPRESSION: 1. Resolution of right axillary adenopathy. 2. No evidence of residual metastatic disease. 3.  Possible constipation. 4. Coronary artery atherosclerosis. Aortic Atherosclerosis (ICD10-I70.0). Electronically Signed   By: Jeronimo Greaves M.D.   On: 07/20/2022 16:17        PHYSICAL EXAMINATION: ECOG PERFORMANCE STATUS: 0 - Asymptomatic  Vitals:   11/18/22 0916  BP: (!) 142/66  Pulse: 83  Resp: 18  Temp: 97.7 F (36.5 C)  SpO2: 100%   Filed Weights   11/18/22 0916  Weight: 179 lb 6.4 oz (81.4 kg)    GENERAL:alert, no distress and comfortable SKIN: skin color, texture, turgor are normal, no rashes or significant lesions EYES: normal, Conjunctiva are pink and non-injected, sclera clear OROPHARYNX:no exudate, no erythema and lips, buccal mucosa, and tongue normal  NECK: supple, thyroid normal size, non-tender, without nodularity LYMPH:  no palpable lymphadenopathy in the cervical, axillary or inguinal LUNGS: clear to auscultation and percussion with normal breathing effort HEART: regular rate & rhythm and no murmurs and no lower extremity edema ABDOMEN:abdomen soft, non-tender and normal bowel sounds Musculoskeletal:no cyanosis of digits and no clubbing  NEURO: alert & oriented x 3 with fluent speech, no focal motor/sensory deficits  LABORATORY DATA:  I have reviewed the data as listed    Component Value Date/Time   NA 139 09/23/2022 0850   K 4.3 09/23/2022 0850   CL 109 09/23/2022 0850   CO2 25 09/23/2022 0850   GLUCOSE 237 (H) 09/23/2022 0850   BUN 36 (H) 09/23/2022 0850   CREATININE 1.36 (H) 09/23/2022 0850   CREATININE 1.05 (H) 04/05/2022 1420   CALCIUM 10.4 (H) 09/23/2022 0850   PROT 6.7  09/23/2022 0850   ALBUMIN 3.8 09/23/2022 0850   AST 27 09/23/2022 0850   AST 23 04/05/2022 1420   ALT 34 09/23/2022 0850   ALT 26 04/05/2022 1420   ALKPHOS 104 09/23/2022 0850   BILITOT 0.4 09/23/2022 0850   BILITOT 0.5 04/05/2022 1420   GFRNONAA 42 (L) 09/23/2022 0850   GFRNONAA 57 (L) 04/05/2022 1420   GFRAA 34 (L) 01/10/2020 1200    No results found for: "SPEP", "UPEP"  Lab Results  Component Value Date   WBC 4.3 11/18/2022   NEUTROABS 2.6 11/18/2022   HGB 11.3 (L) 11/18/2022   HCT 34.4 (L) 11/18/2022   MCV 85.4 11/18/2022   PLT 157 11/18/2022      Chemistry      Component Value Date/Time   NA 139 09/23/2022 0850   K 4.3 09/23/2022 0850   CL 109 09/23/2022 0850   CO2 25 09/23/2022 0850   BUN 36 (H) 09/23/2022 0850   CREATININE 1.36 (H) 09/23/2022 0850   CREATININE 1.05 (H) 04/05/2022 1420      Component Value Date/Time   CALCIUM 10.4 (H) 09/23/2022 0850   ALKPHOS 104 09/23/2022 0850   AST 27 09/23/2022 0850   AST 23 04/05/2022 1420   ALT 34 09/23/2022 0850   ALT 26 04/05/2022 1420   BILITOT 0.4 09/23/2022 0850   BILITOT 0.5 04/05/2022 1420

## 2022-11-18 NOTE — Assessment & Plan Note (Signed)
I will repeat CT imaging of the chest to follow

## 2022-11-18 NOTE — Assessment & Plan Note (Signed)
This is likely multifactorial related to anemia chronic disease It is not likely due to her treatment She is not symptomatic.  Observe only.   

## 2022-11-21 ENCOUNTER — Other Ambulatory Visit: Payer: Self-pay

## 2022-11-21 ENCOUNTER — Telehealth: Payer: Self-pay

## 2022-11-21 MED ORDER — LEVOTHYROXINE SODIUM 100 MCG PO TABS: 100.0000 ug | ORAL_TABLET | Freq: Every day | ORAL | 1 refills | Status: DC

## 2022-11-21 NOTE — Telephone Encounter (Signed)
Called and given below message. She verbalized understanding. Rx sent to her preferred pharmacy. 

## 2022-11-21 NOTE — Telephone Encounter (Signed)
-----   Message from [facility] sent at 11/21/2022  8:01 AM EDT ----- Her TSH is still suppressed Please lower her syhthroid to 100 mcg, send in 60 tabs 1 refill

## 2022-12-05 DIAGNOSIS — N1831 Chronic kidney disease, stage 3a: Secondary | ICD-10-CM | POA: Diagnosis not present

## 2022-12-05 DIAGNOSIS — E1169 Type 2 diabetes mellitus with other specified complication: Secondary | ICD-10-CM | POA: Diagnosis not present

## 2023-01-02 DIAGNOSIS — E113293 Type 2 diabetes mellitus with mild nonproliferative diabetic retinopathy without macular edema, bilateral: Secondary | ICD-10-CM | POA: Diagnosis not present

## 2023-01-02 DIAGNOSIS — H35033 Hypertensive retinopathy, bilateral: Secondary | ICD-10-CM | POA: Diagnosis not present

## 2023-01-04 ENCOUNTER — Telehealth: Payer: Self-pay

## 2023-01-04 NOTE — Telephone Encounter (Signed)
I have 12 pm opening tomorrow, can she come in for eval? Just MD visit, 20 mins

## 2023-01-04 NOTE — Telephone Encounter (Signed)
Pt called and LVM regarding a new knot on her left groin.  Pt is wondering if she needs to be seen sooner than 10/4.

## 2023-01-05 ENCOUNTER — Inpatient Hospital Stay: Payer: Medicare Other

## 2023-01-05 ENCOUNTER — Inpatient Hospital Stay: Payer: Medicare Other | Attending: Gynecology | Admitting: Hematology and Oncology

## 2023-01-05 ENCOUNTER — Telehealth: Payer: Self-pay | Admitting: *Deleted

## 2023-01-05 ENCOUNTER — Encounter: Payer: Self-pay | Admitting: Hematology and Oncology

## 2023-01-05 VITALS — BP 130/66 | HR 87 | Temp 97.9°F | Resp 18 | Ht 66.0 in | Wt 186.8 lb

## 2023-01-05 DIAGNOSIS — Z79899 Other long term (current) drug therapy: Secondary | ICD-10-CM | POA: Diagnosis not present

## 2023-01-05 DIAGNOSIS — C541 Malignant neoplasm of endometrium: Secondary | ICD-10-CM | POA: Diagnosis not present

## 2023-01-05 DIAGNOSIS — Z923 Personal history of irradiation: Secondary | ICD-10-CM | POA: Diagnosis not present

## 2023-01-05 DIAGNOSIS — Z9221 Personal history of antineoplastic chemotherapy: Secondary | ICD-10-CM | POA: Insufficient documentation

## 2023-01-05 DIAGNOSIS — Z90710 Acquired absence of both cervix and uterus: Secondary | ICD-10-CM | POA: Diagnosis not present

## 2023-01-05 DIAGNOSIS — C773 Secondary and unspecified malignant neoplasm of axilla and upper limb lymph nodes: Secondary | ICD-10-CM | POA: Diagnosis not present

## 2023-01-05 DIAGNOSIS — Z90722 Acquired absence of ovaries, bilateral: Secondary | ICD-10-CM | POA: Diagnosis not present

## 2023-01-05 DIAGNOSIS — E039 Hypothyroidism, unspecified: Secondary | ICD-10-CM

## 2023-01-05 LAB — COMPREHENSIVE METABOLIC PANEL
ALT: 17 U/L (ref 0–44)
AST: 18 U/L (ref 15–41)
Albumin: 4.2 g/dL (ref 3.5–5.0)
Alkaline Phosphatase: 91 U/L (ref 38–126)
Anion gap: 6 (ref 5–15)
BUN: 30 mg/dL — ABNORMAL HIGH (ref 8–23)
CO2: 25 mmol/L (ref 22–32)
Calcium: 10.6 mg/dL — ABNORMAL HIGH (ref 8.9–10.3)
Chloride: 110 mmol/L (ref 98–111)
Creatinine, Ser: 1.55 mg/dL — ABNORMAL HIGH (ref 0.44–1.00)
GFR, Estimated: 35 mL/min — ABNORMAL LOW (ref >60)
Glucose, Bld: 97 mg/dL (ref 70–99)
Potassium: 4.1 mmol/L (ref 3.5–5.1)
Sodium: 141 mmol/L (ref 135–145)
Total Bilirubin: 0.5 mg/dL (ref 0.3–1.2)
Total Protein: 7.4 g/dL (ref 6.5–8.1)

## 2023-01-05 LAB — TSH: TSH: 0.512 u[IU]/mL (ref 0.350–4.500)

## 2023-01-05 LAB — CBC WITH DIFFERENTIAL/PLATELET
Abs Immature Granulocytes: 0.01 10*3/uL (ref 0.00–0.07)
Basophils Absolute: 0 10*3/uL (ref 0.0–0.1)
Basophils Relative: 0 %
Eosinophils Absolute: 0.1 10*3/uL (ref 0.0–0.5)
Eosinophils Relative: 2 %
HCT: 37.2 % (ref 36.0–46.0)
Hemoglobin: 12 g/dL (ref 12.0–15.0)
Immature Granulocytes: 0 %
Lymphocytes Relative: 31 %
Lymphs Abs: 1.4 10*3/uL (ref 0.7–4.0)
MCH: 28.3 pg (ref 26.0–34.0)
MCHC: 32.3 g/dL (ref 30.0–36.0)
MCV: 87.7 fL (ref 80.0–100.0)
Monocytes Absolute: 0.3 10*3/uL (ref 0.1–1.0)
Monocytes Relative: 6 %
Neutro Abs: 2.8 10*3/uL (ref 1.7–7.7)
Neutrophils Relative %: 61 %
Platelets: 194 10*3/uL (ref 150–400)
RBC: 4.24 MIL/uL (ref 3.87–5.11)
RDW: 13 % (ref 11.5–15.5)
WBC: 4.5 10*3/uL (ref 4.0–10.5)
nRBC: 0 % (ref 0.0–0.2)

## 2023-01-05 LAB — T4, FREE: Free T4: 1.22 ng/dL — ABNORMAL HIGH (ref 0.61–1.12)

## 2023-01-05 NOTE — Assessment & Plan Note (Signed)
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

## 2023-01-05 NOTE — Telephone Encounter (Signed)
Patient called and left message for Dr. Bertis Ruddy: CT scheduled 01/06/23 at 230. Now needs a follow up appt for next week.

## 2023-01-05 NOTE — Progress Notes (Signed)
[facility] OFFICE PROGRESS NOTE  Patient Care Team: [name], MD as PCP - General (Family Medicine)  ASSESSMENT & PLAN:  Endometrial cancer [facility]) I am concerned about the palpable left inguinal lymph node By my estimate, it measured at least 3 cm in size, potentially bigger This likely represent cancer recurrence Recommend she call to move her CT appointment to be done sooner We will draw labs today I will try to move her appointment to be seen sooner next week for further evaluation  Metastasis to lymph nodes (HCC) I suspect the palpable inguinal lymph node represent metastatic disease As above, we will expedite her CT imaging  No orders of the defined types were placed in this encounter.   All questions were answered. The patient knows to call the clinic with any problems, questions or concerns. The total time spent in the appointment was 30 minutes encounter with patients including review of chart and various tests results, discussions about plan of care and coordination of care plan   Artis Delay, MD 01/05/2023 3:12 PM  INTERVAL HISTORY: Please see below for problem oriented charting. she returns for surveillance follow-up She is evaluated urgently due to concern of palpable lymphadenopathy recently She denies pain She have no recent infection  REVIEW OF SYSTEMS:   Constitutional: Denies fevers, chills or abnormal weight loss Eyes: Denies blurriness of vision Ears, nose, mouth, throat, and face: Denies mucositis or sore throat Respiratory: Denies cough, dyspnea or wheezes Cardiovascular: Denies palpitation, chest discomfort or lower extremity swelling Gastrointestinal:  Denies nausea, heartburn or change in bowel habits Skin: Denies abnormal skin rashes Neurological:Denies numbness, tingling or new weaknesses Behavioral/Psych: Mood is stable, no new changes  All other systems were reviewed with the patient and are negative.  I have reviewed the  past medical history, past surgical history, social history and family history with the patient and they are unchanged from previous note.  ALLERGIES:  is allergic to sulfa antibiotics and sulfamethoxazole.  MEDICATIONS:  Current Outpatient Medications  Medication Sig Dispense Refill   amLODipine-valsartan (EXFORGE) 5-160 MG tablet Take 1 tablet by mouth daily after breakfast.     Azelastine HCl 0.15 % SOLN Place 1-2 sprays into both nostrils daily as needed (sinus congestion/issues.).     calcium elemental as carbonate (BARIATRIC TUMS ULTRA) 400 MG chewable tablet Chew 2 tablets by mouth daily in the afternoon.     diphenhydrAMINE (BENADRYL) 25 MG tablet Take 25 mg by mouth daily as needed (runny nose/sinus issues.).     ergocalciferol (VITAMIN D2) 1.25 MG (50000 UT) capsule Take 1 capsule (50,000 Units total) by mouth once a week. (Patient taking differently: Take 50,000 Units by mouth 2 (two) times a week. Wednesdays & Saturdays.) 25 capsule 1   fexofenadine (ALLEGRA) 180 MG tablet Take 180 mg by mouth daily as needed for allergies or rhinitis.     glipiZIDE (GLUCOTROL XL) 2.5 MG 24 hr tablet Take 2.5 mg by mouth daily with breakfast.     glucose blood (ONETOUCH VERIO) test strip Use as instructed to check blood sugars 3 times a day. 100 each 3   letrozole (FEMARA) 2.5 MG tablet Take 1 tablet (2.5 mg total) by mouth daily. 90 tablet 3   levothyroxine (EUTHYROX) 100 MCG tablet Take 1 tablet (100 mcg total) by mouth daily before breakfast. 60 tablet 1   lidocaine-prilocaine (EMLA) cream Apply topically daily as needed. (Patient taking differently: Apply 1 Application topically daily as needed (prior to port access.).) 30 g  3   OneTouch Delica Lancets 33G MISC 1 each by Other route 2 (two) times daily. Use to monitor glucose levels BID; E11.65 100 each 2   RELION PEN NEEDLES 32G X 4 MM MISC USE 1 PEN PER DAY 50 each 5   simvastatin (ZOCOR) 10 MG tablet Take 10 mg by mouth at bedtime.       spironolactone (ALDACTONE) 25 MG tablet Take 25 mg by mouth as needed.     No current facility-administered medications for this visit.    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 Pennie Rushing who obtained an endometrial biopsy showing poorly differentiated endometrial carcinoma and negative endocervical curettage   12/12/2017 Imaging   MAMMOGRAM FINDINGS: In the right axilla, a possible mass warrants further evaluation. In the left breast, no findings suspicious for malignancy.   Images were processed with CAD.   IMPRESSION: Further evaluation is suggested for possible mass in the right axilla.     12/24/2017 Imaging   Ct scan chest, abdomen and pelvis 1. Marked thickening of the endometrium (42 mm) compatible with known primary endometrial malignancy. No evidence of extrauterine invasion. 2. No pelvic or retroperitoneal adenopathy. 3. Right middle lobe 4 mm solid pulmonary nodule, for which follow-up chest CT is advised in 3-6 months. 4. Several findings that are equivocal for distant metastatic disease. Vaguely nodular heterogeneous hyperenhancement in the peripheral right liver lobe, which could represent benign transient perfusional phenomena, with underlying liver lesions not entirely excluded. Mildly sclerotic T12 vertebral lesion. Mildly enlarged right axillary lymph node. The best single test to further evaluate these findings would be a PET-CT. Alternative tests include bone scan or thoracic MRI without and with IV contrast for the  T12 osseous lesion, MRI abdomen without and with IV contrast for the liver findings, and diagnostic mammographic evaluation for the right axillary node. 5.  Aortic Atherosclerosis (ICD10-I70.0).     01/09/2018 PET scan   1. Moderate hypermetabolism corresponding to enlarging axillary node since 12/22/2017. Highly suspicious for an atypical distribution of metastatic disease. 2. No hypermetabolism to suggest hepatic or T12 osseous metastasis. 3. Hypermetabolic endometrial primary.   01/23/2018 Initial Diagnosis   Endometrial cancer (HCC)   01/23/2018 Pathology Results   1. Lymph node, sentinel, biopsy, left external iliac - NO CARCINOMA IDENTIFIED IN ONE LYMPH NODE (0/1) - SEE COMMENT 2. Lymph nodes, regional resection, right para aortic - NO CARCINOMA IDENTIFIED IN FOUR LYMPH NODES (0/4) - SEE COMMENT 3. Lymph nodes, regional resection, right pelvic - NO CARCINOMA IDENTIFIED IN EIGHT LYMPH NODES (0/8) - SEE COMMENT 4. Cul-de-sac biopsy - METASTATIC CARCINOMA 5. Uterus +/- tubes/ovaries, neoplastic, cervix, bilateral fallopian tubes and ovaries UTERUS: - MIXED SEROUS AND ENDOMETRIOID CARCINOMA - SEROSAL IMPLANTS PRESENT - LYMPHOVASCULAR SPACE INVASION PRESENT - LEIOMYOMATA (1.5 CM; LARGEST) - SEE ONCOLOGY TABLE AND COMMENT BELOW CERVIX: - BENIGN NABOTHIAN CYSTS - NO CARCINOMA IDENTIFIED BILATERAL OVARIES: - METASTATIC CARCINOMA PRESENT ON OVARIAN SURFACE BILATERAL FALLOPIAN TUBES: - INTRALUMINAL CARCINOMA Microscopic Comment 1. -3. Cytokeratin AE1/3 was performed on the sentinel lymph nodes to  exclude micrometastasis. There is no evidence of metastatic carcinoma by immunohistochemistry. 5. 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%) Histologic  Grade: N/A Myometrial invasion: Estimated less than 50% myometrial invasion (0.3 cm of myometrium involved; 1.4 cm measured thickness) Uterine Serosa Involvement: Present Cervical stromal involvement: Not identified Extent of involvement of other organs: - Fallopian tube (left within the lumen) - Ovary, left (surface involvement) - Cul-de-sac Lymphovascular invasion: Present Regional Lymph Nodes: Examined: 1 Sentinel 12 Non-sentinel 13 Total Lymph nodes with metastasis: 0 Isolated tumor cells (< 0.2 mm): 0 Micrometastasis: (> 0.2 mm and < 2.0 mm): 0 Macrometastasis: (> 2.0 mm): 0 Extracapsular extension: N/A Tumor block for ancillary studies: 5E, 5B MMR / MSI testing: Pending will be reported separately Pathologic Stage Classification (pTNM, AJCC 8th edition): pT3a, pN0 FIGO Stage: IIIA COMMENT: There is tumor present on the surface of the left ovary and within the lumen of the left fallopian tube. The carcinoma appears to be mixed with the largest component being high grade serous carcinoma. Dr. Colonel Bald reviewed the case and agrees with the above diagnosis.   01/23/2018 Surgery   Surgeon: Quinn Axe     Operation: Robotic-assisted laparoscopic total hysterectomy with bilateral salpingoophorectomy, SLN injection, mapping and biopsy, right pelvic and para-aortic lymphadenectomy   Operative Findings:  : 10-12cm bulky uterus with frank serosal involvement on posterior cul de sac peritoneum and anterior peritoneum which adhesed the bladder to the anterior uterus. Unilateral mapping on left pelvis. No grossly suspicious nodes. Normal omentum and diaphragms.    02/01/2018 Pathology Results   Lymph node, needle/core biopsy, right axilla - METASTATIC CARCINOMA - SEE COMMENT Microscopic Comment The neoplastic cells are positive for cytokeratin 7 and Pax-8 but negative for cytokeratin 5/6, cytokeratin 20, Gata-3, p63, p53 and GCDFP. Overall, the immunoprofile is consistent with  metastasis from the patient's known gynecologic carcinoma.   02/01/2018 Procedure   Ultrasound-guided core biopsies of a suspicious right axillary lymph node.   02/21/2018 - 06/13/2018 Chemotherapy   The patient had carboplatin & Taxol x 6   03/26/2018 - 04/30/2018 Radiation Therapy   Radiation treatment dates:   03/26/18, 04/02/18, 04/19/18, 04/23/18 04/30/18   Site/dose: proximal Vagina, 6 Gy in 5 fractions for a total dose of 30 Gy     04/26/2018 PET scan   1. Interval resection of the hypermetabolic endometrial primary. No discernible hypermetabolic pelvic sidewall or peritoneal metastases. 2. Stable hypermetabolic bilateral axillary lymphadenopathy. The degree of hypermetabolism in these lymph nodes remains highly suspicious for neoplasm. 3. Interval development of low level FDG uptake in 2 stable, small lymph nodes in the left groin region. This may be reactive, but close attention recommended to exclude metastatic involvement. 4. Stable non hypermetabolic low-density liver lesions and the mixed lucent and sclerotic T12 lesion is stable without hypermetabolism today.   07/09/2018 Imaging   Status post hysterectomy.   Mild axillary lymphadenopathy, improved. Nodal metastases not excluded.   Irregular wall thickening involving the anterior bladder, possibly reflecting radiation cystitis versus tumor.   Additional stable findings as above, including a 5 mm right middle lobe nodule and stable sclerosis involving the T12 vertebral body.   10/10/2018 Imaging   1. Stable exam. No findings within the abdomen or pelvis to suggest metastatic disease. 2. Unchanged appearance of sclerosis involving the T12 vertebra. 3.  Aortic Atherosclerosis (ICD10-I70.0).  10/10/2018 Imaging   Ct abdomen and pelvis 1. Stable exam. No findings within the abdomen or pelvis to suggest metastatic disease. 2. Unchanged appearance of sclerosis involving the T12 vertebra. 3.  Aortic Atherosclerosis (ICD10-I70.0).    01/16/2019 PET scan   1. Enlargement and increased activity in the left axillary, right axillary, and left inguinal adenopathy compatible with progressive malignancy. 2. Stable 4 mm right middle lobe subpleural nodule, not appreciably hypermetabolic. 3. Other imaging findings of potential clinical significance: Right kidney lower pole cyst. Aortic Atherosclerosis (ICD10-I70.0). Prominent stool throughout the colon favors constipation. Mild chronic left maxillary sinusitis.     02/01/2019 -  Chemotherapy   The patient had pembrolizumab and Lenvima for chemotherapy treatment.     04/25/2019 Imaging   Ct imaging 1. Interval response to therapy. Decrease in size of bilateral axillary and left inguinal lymph nodes. No new or progressive findings identified. 2. Stable sclerotic appearance of the T12 vertebra. 3. Aortic atherosclerosis. Lad coronary artery calcification noted.   08/30/2019 Imaging   1. Bulky RIGHT hilar lymph node, slightly increased in size from previous imaging. 2. Multiple foci of hyperenhancement in the liver. 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 Consider MRI liver on follow-up to establish a baseline for number of lesions as these will appear variable on CT follow-up based on phase of contrast acquisition in the future. 3. Stable 5 mm nodule adjacent to the fissure, minor fissure in the RIGHT middle lobe.     11/28/2019 Imaging   1. Stable exam. No new or progressive findings. 2. Stable enlarged right axillary lymph node. 3. Stable tiny bilateral pulmonary nodules. Continued attention on follow-up recommended. 4. Scattered hypodensities in the liver parenchyma correspond to the hypervascular lesions seen on the previous study performed with intravenous contrast material. 5.  Right renal cyst. 6. Aortic Atherosclerosis (ICD10-I70.0).     03/16/2020 Imaging   Increased echogenicity of renal parenchyma is noted bilaterally suggesting medical renal disease. No hydronephrosis or renal obstruction is noted   04/02/2020 Imaging   1. Right axillary lymphadenopathy is stable. Tiny bilateral pulmonary nodules are stable. Subcentimeter low-attenuation liver lesions are stable. 2. No new or progressive metastatic disease in the chest, abdomen or pelvis. 3. Aortic Atherosclerosis (ICD10-I70.0).   09/15/2020 Imaging   1. Stable RIGHT axillary lymph node with enlargement measuring 2 cm short axis. 2. Stable small nodule along the minor fissure in the RIGHT chest. 3. No new or progressive findings. 4. Aortic atherosclerosis.   03/24/2021 Imaging   1. Right axillary lymphadenopathy is stable compared to prior studies. 2. No other definite signs of metastatic disease elsewhere in the thorax. 3. Hepatic steatosis. Numerous hypervascular areas noted in the liver, incompletely characterized on today's arterial phase examination. These are similar to remote prior examination from 08/30/2019, likely to represent small benign lesions such as flash fill cavernous hemangiomas. These could be definitively evaluated with nonemergent abdominal MRI with and without IV gadolinium if of clinical concern. 4. Aortic atherosclerosis, in addition to left main and left anterior descending coronary artery disease. Assessment for potential risk factor modification, dietary therapy or pharmacologic therapy may be warranted, if clinically indicated.   Aortic Atherosclerosis (ICD10-I70.0).   09/16/2021 Imaging   1. Unchanged enlarged right axillary lymph node. No evidence of new lymphadenopathy or metastatic  disease in the chest, abdomen, or pelvis. 2. Subcentimeter hyperenhancing lesions of the right lobe of the liver, hepatic segments VII and VIII are unchanged, measuring up to 0.7 cm. These are likely  small benign incidental flash filling hemangiomata although incompletely characterized on this examination. Attention on follow-up. 3. Status post hysterectomy. 4. Cholelithiasis. 5. Coronary artery disease.   Aortic Atherosclerosis (ICD10-I70.0).     12/31/2021 - 03/04/2022 Chemotherapy   Patient is on Treatment Plan : UTERINE Lenvatinib (20) D1-21 + Pembrolizumab (200) D1 q21d     03/24/2022 Imaging   1. Similar to mild enlargement of an isolated right axillary nodal mass. 2. No new sites of disease identified. 3.  Possible constipation. 4. Coronary artery atherosclerosis. Aortic Atherosclerosis (ICD10-I70.0).   04/22/2022 Pathology Results   A. AXILLARY, RIGHT, LYMPH NODE:  -  1 of 3 lymph nodes positive for metastatic carcinoma, consistent with  the patient's known endometrial carcinoma (i.e. morphologically consistent with papillary serous carcinoma) with areas of geographic necrosis, negative for extracapsular extension on sections examined.    04/22/2022 Surgery   Preoperative Diagnosis: RIGHT AXILLARY LYMPHADENOPATHY    Postoperative Diagnosis: RIGHT AXILLARY LYMPHADENOPATHY    Surgical Procedure: RIGHT AXILLARY LYMPH NODE BIOPSY:     Operative Team Members:  Surgeon(s) and Role:    * Stechschulte, Hyman Hopes, MD - Primary     Specimen:  ID Type Source Tests Collected by Time Destination  1 : Right Axillary Lymph Node Tissue PATH Lymph nodes regional SURGICAL PATHOLOGY Stechschulte, Hyman Hopes, MD 04/22/2022 1040         Indications for Procedure: NEILANI CISNEROZ is a 72 y.o. female who presented with right axillary lymphadenopathy. I recommended right axillary lymph node biopsy. The procedure itself as well as its risk, benefits, and alternatives were discussed the patient in full. After full discussion all questions answered the patient granted consent to proceed.    Findings: Firm enlarged right axillary lymph node    Description of Procedure:    On the date stated above the  patient was taken operating room placed in supine position.  Anesthesia was induced.  A timeout was completed verifying correct patient, procedure, positioning, and equipment needed for the case.  The patient's chest and right axilla was prepped and draped in the usual sterile fashion.   I made a incision along the hairline anterior to the axilla and dissected through the subcutaneous tissues into the axilla.  Open the axillary fascia and palpated for the lymph node.  The lymph node was dissected circumferentially.  The blood supply of the lymph node was controlled using two 2-0 Vicryl sutures.  Was passed off the field as a specimen.  The wound was irrigated and hemostasis was obtained.  The wound was closed in layers with 2-0 Vicryl suture running the deeper layers together and 4-0 Monocryl and Dermabond for the skin.  All sponge needle counts were correct at the end of the case.   07/21/2022 Imaging   CT CHEST ABDOMEN PELVIS W CONTRAST  Result Date: 07/20/2022 CLINICAL DATA:  Cervical/endometrial cancer/evaluate treatment response. Lung nodules. * Tracking Code: BO * EXAM: CT CHEST, ABDOMEN, AND PELVIS WITH CONTRAST TECHNIQUE: Multidetector CT imaging of the chest, abdomen and pelvis was performed following the standard protocol during bolus administration of intravenous contrast. RADIATION DOSE REDUCTION: This exam was performed according to the departmental dose-optimization program which includes automated exposure control, adjustment of the mA and/or kV according to patient size and/or use of iterative reconstruction technique. CONTRAST:  80mL OMNIPAQUE IOHEXOL 300 MG/ML  SOLN COMPARISON:  03/23/2022 FINDINGS: CT CHEST FINDINGS Cardiovascular: Right Port-A-Cath tip superior caval/atrial junction. Aortic atherosclerosis. Tortuous thoracic aorta. Mild cardiomegaly, without pericardial effusion. Lad coronary artery calcification. No central pulmonary embolism, on this non-dedicated study. Mediastinum/Nodes:  No supraclavicular adenopathy. Resolution of right axillary adenopathy. No mediastinal or hilar adenopathy. Lungs/Pleura: No pleural fluid. A Perifissural right middle lobe 4 mm pulmonary nodule on 67/5 is unchanged and likely a subpleural lymph node. Left upper lobe 4-5 mm ground-glass nodule is unchanged on 39/5. Musculoskeletal: No acute osseous abnormality. 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 No hypermetabolism on prior PET. CT ABDOMEN PELVIS FINDINGS Hepatobiliary: Normal liver. Normal gallbladder, without biliary ductal dilatation. Pancreas: Normal, without mass or ductal dilatation. Spleen: Normal in size, without focal abnormality. Adrenals/Urinary Tract: Normal adrenal glands. Normal left kidney. Exophytic inter/lower pole right renal 3.7 cm cyst . In the absence of clinically indicated signs/symptoms require(s) no independent follow-up. No hydronephrosis. Normal urinary bladder. Stomach/Bowel: Gastric antral underdistention. Colonic stool burden suggests constipation. Normal appendix. Normal small bowel. Vascular/Lymphatic: Aortic atherosclerosis. Small abdominal retroperitoneal nodes are similar, not pathologic by size criteria. No abdominopelvic adenopathy. Reproductive: Hysterectomy.  No adnexal mass. Other: No significant free fluid. No free intraperitoneal air. No evidence of omental or peritoneal disease. Musculoskeletal: Osteopenia. Trace L4-5 anterolisthesis with prominent disc bulges at L3-4 through L5-S1. IMPRESSION: 1. Resolution of right axillary adenopathy. 2. No evidence of residual metastatic disease. 3.  Possible constipation. 4. Coronary artery atherosclerosis. Aortic Atherosclerosis (ICD10-I70.0). Electronically Signed   By: Jeronimo Greaves M.D.   On: 07/20/2022 16:17        PHYSICAL EXAMINATION: ECOG PERFORMANCE STATUS: 1 - Symptomatic but completely ambulatory  Vitals:   01/05/23 1209 01/05/23 1248  BP: (!)  153/85 130/66  Pulse: 87   Resp: 18   Temp: 97.9 F (36.6 C)   SpO2: 100%    Filed Weights   01/05/23 1209  Weight: 186 lb 12.8 oz (84.7 kg)    GENERAL:alert, no distress and comfortable SKIN: skin color, texture, turgor are normal, no rashes or significant lesions EYES: normal, Conjunctiva are pink and non-injected, sclera clear OROPHARYNX:no exudate, no erythema and lips, buccal mucosa, and tongue normal  NECK: supple, thyroid normal size, non-tender, without nodularity LYMPH: She has palpable lymphadenopathy in the left inguinal region LUNGS: clear to auscultation and percussion with normal breathing effort HEART: regular rate & rhythm and no murmurs and no lower extremity edema ABDOMEN:abdomen soft, non-tender and normal bowel sounds Musculoskeletal:no cyanosis of digits and no clubbing  NEURO: alert & oriented x 3 with fluent speech, no focal motor/sensory deficits  LABORATORY DATA:  I have reviewed the data as listed    Component Value Date/Time   NA 141 01/05/2023 1231   K 4.1 01/05/2023 1231   CL 110 01/05/2023 1231   CO2 25 01/05/2023 1231   GLUCOSE 97 01/05/2023 1231   BUN 30 (H) 01/05/2023 1231   CREATININE 1.55 (H) 01/05/2023 1231   CREATININE 1.05 (H) 04/05/2022 1420   CALCIUM 10.6 (H) 01/05/2023 1231   PROT 7.4 01/05/2023 1231   ALBUMIN 4.2 01/05/2023 1231   AST 18 01/05/2023 1231   AST 23 04/05/2022 1420   ALT 17 01/05/2023 1231   ALT 26 04/05/2022 1420   ALKPHOS 91 01/05/2023 1231   BILITOT 0.5 01/05/2023 1231   BILITOT 0.5 04/05/2022 1420   GFRNONAA 35 (L) 01/05/2023 1231  GFRNONAA 57 (L) 04/05/2022 1420   GFRAA 34 (L) 01/10/2020 1200    No results found for: "SPEP", "UPEP"  Lab Results  Component Value Date   WBC 4.5 01/05/2023   NEUTROABS 2.8 01/05/2023   HGB 12.0 01/05/2023   HCT 37.2 01/05/2023   MCV 87.7 01/05/2023   PLT 194 01/05/2023      Chemistry      Component Value Date/Time   NA 141 01/05/2023 1231   K 4.1 01/05/2023  1231   CL 110 01/05/2023 1231   CO2 25 01/05/2023 1231   BUN 30 (H) 01/05/2023 1231   CREATININE 1.55 (H) 01/05/2023 1231   CREATININE 1.05 (H) 04/05/2022 1420      Component Value Date/Time   CALCIUM 10.6 (H) 01/05/2023 1231   ALKPHOS 91 01/05/2023 1231   AST 18 01/05/2023 1231   AST 23 04/05/2022 1420   ALT 17 01/05/2023 1231   ALT 26 04/05/2022 1420   BILITOT 0.5 01/05/2023 1231   BILITOT 0.5 04/05/2022 1420

## 2023-01-05 NOTE — Assessment & Plan Note (Addendum)
I suspect the palpable inguinal lymph node represent metastatic disease As above, we will expedite her CT imaging

## 2023-01-06 ENCOUNTER — Ambulatory Visit (INDEPENDENT_AMBULATORY_CARE_PROVIDER_SITE_OTHER): Payer: Medicare Other | Admitting: Podiatry

## 2023-01-06 ENCOUNTER — Encounter: Payer: Self-pay | Admitting: Podiatry

## 2023-01-06 ENCOUNTER — Encounter: Payer: Self-pay | Admitting: Hematology and Oncology

## 2023-01-06 ENCOUNTER — Telehealth: Payer: Self-pay | Admitting: *Deleted

## 2023-01-06 ENCOUNTER — Ambulatory Visit (HOSPITAL_COMMUNITY)
Admission: RE | Admit: 2023-01-06 | Discharge: 2023-01-06 | Disposition: A | Discharge status: Home / Self Care | Payer: Medicare Other | Source: Ambulatory Visit | Attending: Hematology and Oncology | Admitting: Hematology and Oncology

## 2023-01-06 DIAGNOSIS — B351 Tinea unguium: Secondary | ICD-10-CM

## 2023-01-06 DIAGNOSIS — R590 Localized enlarged lymph nodes: Secondary | ICD-10-CM | POA: Diagnosis not present

## 2023-01-06 DIAGNOSIS — N183 Chronic kidney disease, stage 3 unspecified: Secondary | ICD-10-CM

## 2023-01-06 DIAGNOSIS — M79675 Pain in left toe(s): Secondary | ICD-10-CM | POA: Diagnosis not present

## 2023-01-06 DIAGNOSIS — E0822 Diabetes mellitus due to underlying condition with diabetic chronic kidney disease: Secondary | ICD-10-CM | POA: Diagnosis not present

## 2023-01-06 DIAGNOSIS — K7689 Other specified diseases of liver: Secondary | ICD-10-CM | POA: Diagnosis not present

## 2023-01-06 DIAGNOSIS — G620 Drug-induced polyneuropathy: Secondary | ICD-10-CM

## 2023-01-06 DIAGNOSIS — K429 Umbilical hernia without obstruction or gangrene: Secondary | ICD-10-CM | POA: Diagnosis not present

## 2023-01-06 DIAGNOSIS — N281 Cyst of kidney, acquired: Secondary | ICD-10-CM | POA: Diagnosis not present

## 2023-01-06 DIAGNOSIS — Z794 Long term (current) use of insulin: Secondary | ICD-10-CM | POA: Diagnosis not present

## 2023-01-06 DIAGNOSIS — M79674 Pain in right toe(s): Secondary | ICD-10-CM

## 2023-01-06 DIAGNOSIS — C541 Malignant neoplasm of endometrium: Secondary | ICD-10-CM | POA: Insufficient documentation

## 2023-01-06 DIAGNOSIS — T451X5A Adverse effect of antineoplastic and immunosuppressive drugs, initial encounter: Secondary | ICD-10-CM

## 2023-01-06 MED ORDER — HEPARIN SOD (PORK) LOCK FLUSH 100 UNIT/ML IV SOLN
INTRAVENOUS | Status: AC
  Filled 2023-01-06: qty 5

## 2023-01-06 MED ORDER — HEPARIN SOD (PORK) LOCK FLUSH 100 UNIT/ML IV SOLN
500.0000 [IU] | Freq: Once | INTRAVENOUS | Status: AC
  Administered 2023-01-06: 500 [IU] via INTRAVENOUS

## 2023-01-06 MED ORDER — IOHEXOL 300 MG/ML  SOLN
100.0000 mL | Freq: Once | INTRAMUSCULAR | Status: AC | PRN
  Administered 2023-01-06: 100 mL via INTRAVENOUS

## 2023-01-06 NOTE — Telephone Encounter (Signed)
Contacted patient - LVM with appointment info as directed by Dr. Bertis Ruddy in message below. Encouraged patient to call office with questions

## 2023-01-06 NOTE — Telephone Encounter (Signed)
[id number] Received Call Back report from [facility] - CT C/A/P read by Dr. Maudry Mayhew:   -New enhancing 12 x 7 mm soft tissue nodule along the anterior capsule of the lateral left lobe of the liver no significant abdominopelvic free fluid., Suspicious for peritoneal metastatic disease. -New pathologically enlarged left inguinal lymph node measures 2.7 cm in short axis, suspicious for nodal metastatic disease. -Mild symmetric esophageal wall thickening, correlate for esophagitis. -Aortic Atherosclerosis

## 2023-01-06 NOTE — Progress Notes (Unsigned)
Subjective:  Patient ID: Krista Johnson, female    DOB: Mar 14, 1951,  MRN: [id number]  Krista Johnson presents to clinic today for at risk foot care. Pt has h/o NIDDM with chronic kidney disease and painful thick toenails that are difficult to trim. Pain interferes with ambulation. Aggravating factors include wearing enclosed shoe gear. Pain is relieved with periodic professional debridement.  Chief Complaint  Patient presents with   Nail Problem    DFC,Referring Provider [name], MD,lov:09/24,BS:not taken today,A1C:6.8      New problem(s): None.   PCP is [name], MD.  Allergies  Allergen Reactions   Sulfa Antibiotics Nausea Only   Sulfamethoxazole Nausea Only    Review of Systems: Negative except as noted in the HPI.  Objective: No changes noted in today's physical examination. There were no vitals filed for this visit. Krista Johnson is a pleasant 72 y.o. female WD, WN in NAD. AAO x 3.  Vascular Examination: Capillary refill time immediate b/l. Vascular status intact b/l with palpable pedal pulses. Pedal hair absent b/l. No pain with calf compression b/l. Skin temperature gradient WNL b/l. No cyanosis or clubbing b/l. No ischemia or gangrene noted b/l.   Neurological Examination: Sensation grossly intact b/l with 10 gram monofilament. Vibratory sensation intact b/l. Pt has subjective symptoms of neuropathy.  Dermatological Examination: Pedal skin with normal turgor, texture and tone b/l.  No open wounds. No interdigital macerations.   Toenails 1-5 b/l thick, discolored, elongated with subungual debris and pain on dorsal palpation.   No corns, calluses nor porokeratotic lesions noted.  Musculoskeletal Examination: Normal muscle strength 5/5 to all lower extremity muscle groups bilaterally. Hammertoe(s) noted to the 2-5 left foot and 2-5 right foot.Marland Kitchen No pain, crepitus or joint limitation noted with ROM b/l LE.  Patient ambulates independently without  assistive aids.  Radiographs: None  Last A1c:      08/09/2022   12:00 AM 04/14/2022    9:00 AM  Hemoglobin A1C  Hemoglobin-A1c 6.8     6.6      This result is from an external source.   Assessment/Plan: 1. Pain due to onychomycosis of toenails of both feet   2. 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 Diabetes mellitus due to underlying condition with stage 3 chronic kidney disease, with long-term current use of insulin, unspecified whether stage 3a or 3b CKD (HCC)    -Consent given for treatment as described below: -Examined patient. -Patient to continue soft, supportive shoe gear daily. -Mycotic toenails 1-5 bilaterally were debrided in length and girth with sterile nail nippers and dremel without incident. -Patient/POA to call should there be question/concern in the interim.   Return in about 3 months (around 04/07/2023).  Freddie Breech, DPM

## 2023-01-06 NOTE — Telephone Encounter (Signed)
Please add her to me schedule to see at 3 pm on 9/24, 30 mins

## 2023-01-10 ENCOUNTER — Inpatient Hospital Stay (HOSPITAL_BASED_OUTPATIENT_CLINIC_OR_DEPARTMENT_OTHER): Payer: Medicare Other | Admitting: Hematology and Oncology

## 2023-01-10 ENCOUNTER — Encounter: Payer: Self-pay | Admitting: Hematology and Oncology

## 2023-01-10 VITALS — BP 151/84 | HR 92 | Resp 18 | Ht 66.0 in | Wt 189.5 lb

## 2023-01-10 DIAGNOSIS — C541 Malignant neoplasm of endometrium: Secondary | ICD-10-CM | POA: Diagnosis not present

## 2023-01-10 DIAGNOSIS — Z9221 Personal history of antineoplastic chemotherapy: Secondary | ICD-10-CM | POA: Diagnosis not present

## 2023-01-10 DIAGNOSIS — Z79899 Other long term (current) drug therapy: Secondary | ICD-10-CM | POA: Diagnosis not present

## 2023-01-10 DIAGNOSIS — Z90722 Acquired absence of ovaries, bilateral: Secondary | ICD-10-CM | POA: Diagnosis not present

## 2023-01-10 DIAGNOSIS — Z923 Personal history of irradiation: Secondary | ICD-10-CM | POA: Diagnosis not present

## 2023-01-10 DIAGNOSIS — Z90710 Acquired absence of both cervix and uterus: Secondary | ICD-10-CM | POA: Diagnosis not present

## 2023-01-10 NOTE — Assessment & Plan Note (Addendum)
I have reviewed imaging study with the patient Unfortunately, she has clinical signs of disease recurrence We will discontinue antiestrogen therapy We discussed risk and benefits of pursuing biopsy In my opinion, this represent disease recurrence and I do not recommend biopsy Previously, she tolerated combination of immunotherapy with Lenvima well We discussed risk and benefits of resumption of chemotherapy versus combination of immunotherapy with Lenvima She is undecided and she will pray about it and call me tomorrow with the final decisions If you choose to resume combination of Lenvima with pembrolizumab, we will get insurance prior authorization and we will start her on similar dose as before I addressed all her questions and concerns

## 2023-01-10 NOTE — Progress Notes (Signed)
[facility] OFFICE PROGRESS NOTE  Patient Care Team: [name], MD as PCP - General (Family Medicine)  ASSESSMENT & PLAN:  Endometrial cancer [facility] I have reviewed imaging study with the patient Unfortunately, she has clinical signs of disease recurrence We will discontinue antiestrogen therapy We discussed risk and benefits of pursuing biopsy In my opinion, this represent disease recurrence and I do not recommend biopsy Previously, she tolerated combination of immunotherapy with Lenvima well We discussed risk and benefits of resumption of chemotherapy versus combination of immunotherapy with Lenvima She is undecided and she will pray about it and call me tomorrow with the final decisions If you choose to resume combination of Lenvima with pembrolizumab, we will get insurance prior authorization and we will start her on similar dose as before I addressed all her questions and concerns     No orders of the defined types were placed in this encounter.   All questions were answered. The patient knows to call the clinic with any problems, questions or concerns. The total time spent in the appointment was 40 minutes encounter with patients including review of chart and various tests results, discussions about plan of care and coordination of care plan   Artis Delay, MD 01/10/2023 3:13 PM  INTERVAL HISTORY: Please see below for problem oriented charting. she returns for surveillance follow-up and review of CT imaging results She denies changes to her groin lymph node We reviewed imaging studies together and discussed risk and benefits of different treatment options  REVIEW OF SYSTEMS:   Constitutional: Denies fevers, chills or abnormal weight loss Eyes: Denies blurriness of vision Ears, nose, mouth, throat, and face: Denies mucositis or sore throat Respiratory: Denies cough, dyspnea or wheezes Cardiovascular: Denies palpitation, chest discomfort or lower  extremity swelling Gastrointestinal:  Denies nausea, heartburn or change in bowel habits Skin: Denies abnormal skin rashes Lymphatics: Denies new lymphadenopathy or easy bruising Neurological:Denies numbness, tingling or new weaknesses Behavioral/Psych: Mood is stable, no new changes  All other systems were reviewed with the patient and are negative.  I have reviewed the past medical history, past surgical history, social history and family history with the patient and they are unchanged from previous note.  ALLERGIES:  is allergic to sulfa antibiotics and sulfamethoxazole.  MEDICATIONS:  Current Outpatient Medications  Medication Sig Dispense Refill   amLODipine-valsartan (EXFORGE) 5-160 MG tablet Take 1 tablet by mouth daily after breakfast.     Azelastine HCl 0.15 % SOLN Place 1-2 sprays into both nostrils daily as needed (sinus congestion/issues.).     calcium elemental as carbonate (BARIATRIC TUMS ULTRA) 400 MG chewable tablet Chew 2 tablets by mouth daily in the afternoon.     diphenhydrAMINE (BENADRYL) 25 MG tablet Take 25 mg by mouth daily as needed (runny nose/sinus issues.).     ergocalciferol (VITAMIN D2) 1.25 MG (50000 UT) capsule Take 1 capsule (50,000 Units total) by mouth once a week. (Patient taking differently: Take 50,000 Units by mouth 2 (two) times a week. Wednesdays & Saturdays.) 25 capsule 1   fexofenadine (ALLEGRA) 180 MG tablet Take 180 mg by mouth daily as needed for allergies or rhinitis.     glipiZIDE (GLUCOTROL XL) 2.5 MG 24 hr tablet Take 2.5 mg by mouth daily with breakfast.     glucose blood (ONETOUCH VERIO) test strip Use as instructed to check blood sugars 3 times a day. 100 each 3   levothyroxine (EUTHYROX) 100 MCG tablet Take 1 tablet (100 mcg total) by mouth daily  before breakfast. 60 tablet 1   lidocaine-prilocaine (EMLA) cream Apply topically daily as needed. (Patient taking differently: Apply 1 Application topically daily as needed (prior to port  access.).) 30 g 3   OneTouch Delica Lancets 33G MISC 1 each by Other route 2 (two) times daily. Use to monitor glucose levels BID; E11.65 100 each 2   RELION PEN NEEDLES 32G X 4 MM MISC USE 1 PEN PER DAY 50 each 5   simvastatin (ZOCOR) 10 MG tablet Take 10 mg by mouth at bedtime.      spironolactone (ALDACTONE) 25 MG tablet Take 25 mg by mouth as needed.     No current facility-administered medications for this visit.    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 Pennie Rushing who obtained an endometrial biopsy showing poorly differentiated endometrial carcinoma and negative endocervical curettage   12/12/2017 Imaging   MAMMOGRAM FINDINGS: In the right axilla, a possible mass warrants further evaluation. In the left breast, no findings suspicious for malignancy.   Images were processed with CAD.   IMPRESSION: Further evaluation is suggested for possible mass in the right axilla.     12/24/2017 Imaging   Ct scan chest, abdomen and pelvis 1. Marked thickening of the endometrium (42 mm) compatible with known primary endometrial malignancy. No evidence of extrauterine invasion. 2. No pelvic or retroperitoneal adenopathy. 3. Right middle lobe 4 mm solid pulmonary nodule, for which follow-up chest CT is advised in 3-6 months. 4. Several findings that are equivocal for distant metastatic disease. Vaguely nodular heterogeneous hyperenhancement in the peripheral right liver lobe, which could represent benign transient perfusional phenomena, with  underlying liver lesions not entirely excluded. Mildly sclerotic T12 vertebral lesion. Mildly enlarged right axillary lymph node. The best single test to further evaluate these findings would be a PET-CT. Alternative tests include bone scan or thoracic MRI without and with IV contrast for the T12 osseous lesion, MRI abdomen without and with IV contrast for the liver findings, and diagnostic mammographic evaluation for the right axillary node. 5.  Aortic Atherosclerosis (ICD10-I70.0).     01/09/2018 PET scan   1. Moderate hypermetabolism corresponding to enlarging axillary node since 12/22/2017. Highly suspicious for an atypical distribution of metastatic disease. 2. No hypermetabolism to suggest hepatic or T12 osseous metastasis. 3. Hypermetabolic endometrial primary.   01/23/2018 Initial Diagnosis   Endometrial cancer (HCC)   01/23/2018 Pathology Results   1. Lymph node, sentinel, biopsy, left external iliac - NO CARCINOMA IDENTIFIED IN ONE LYMPH NODE (0/1) - SEE COMMENT 2. Lymph nodes, regional resection, right para aortic - NO CARCINOMA IDENTIFIED IN FOUR LYMPH NODES (0/4) - SEE COMMENT 3. Lymph nodes, regional resection, right pelvic - NO CARCINOMA IDENTIFIED IN EIGHT LYMPH NODES (0/8) - SEE COMMENT 4. Cul-de-sac biopsy - METASTATIC CARCINOMA 5. Uterus +/- tubes/ovaries, neoplastic, cervix, bilateral fallopian tubes and ovaries UTERUS: - MIXED SEROUS AND ENDOMETRIOID CARCINOMA - SEROSAL IMPLANTS PRESENT - LYMPHOVASCULAR SPACE INVASION PRESENT - LEIOMYOMATA (1.5 CM; LARGEST) - SEE ONCOLOGY TABLE AND COMMENT BELOW CERVIX: - BENIGN NABOTHIAN CYSTS - NO  CARCINOMA IDENTIFIED BILATERAL OVARIES: - METASTATIC CARCINOMA PRESENT ON OVARIAN SURFACE BILATERAL FALLOPIAN TUBES: - INTRALUMINAL CARCINOMA Microscopic Comment 1. -3. Cytokeratin AE1/3 was performed on the sentinel lymph nodes to exclude micrometastasis. There is no evidence of metastatic carcinoma by immunohistochemistry. 5.  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%) Histologic Grade: N/A Myometrial invasion: Estimated less than 50% myometrial invasion (0.3 cm of myometrium involved; 1.4 cm measured thickness) Uterine Serosa Involvement: Present Cervical stromal involvement: Not identified Extent of involvement of other organs: - Fallopian tube (left within the lumen) - Ovary, left (surface involvement) - Cul-de-sac Lymphovascular invasion: Present Regional Lymph Nodes: Examined: 1 Sentinel 12 Non-sentinel 13 Total Lymph nodes with metastasis: 0 Isolated tumor cells (< 0.2 mm): 0 Micrometastasis: (> 0.2 mm and < 2.0 mm): 0 Macrometastasis: (> 2.0 mm): 0 Extracapsular extension: N/A Tumor block for ancillary studies: 5E, 5B MMR / MSI testing: Pending will be reported separately Pathologic Stage Classification (pTNM, AJCC 8th edition): pT3a, pN0 FIGO Stage: IIIA COMMENT: There is tumor present on the surface of the left ovary and within the lumen of the left fallopian tube. The carcinoma appears to be mixed with the largest component being high grade serous carcinoma. Dr. Colonel Bald reviewed the case and agrees with the above diagnosis.   01/23/2018 Surgery   Surgeon: Quinn Axe     Operation: Robotic-assisted laparoscopic total hysterectomy with bilateral salpingoophorectomy, SLN injection, mapping and biopsy, right pelvic and para-aortic lymphadenectomy   Operative Findings:  : 10-12cm bulky uterus with frank serosal involvement on posterior cul de sac peritoneum and anterior peritoneum which adhesed the bladder to the anterior uterus. Unilateral mapping on left pelvis. No grossly suspicious nodes. Normal omentum and diaphragms.    02/01/2018 Pathology Results   Lymph  node, needle/core biopsy, right axilla - METASTATIC CARCINOMA - SEE COMMENT Microscopic Comment The neoplastic cells are positive for cytokeratin 7 and Pax-8 but negative for cytokeratin 5/6, cytokeratin 20, Gata-3, p63, p53 and GCDFP. Overall, the immunoprofile is consistent with metastasis from the patient's known gynecologic carcinoma.   02/01/2018 Procedure   Ultrasound-guided core biopsies of a suspicious right axillary lymph node.   02/21/2018 - 06/13/2018 Chemotherapy   The patient had carboplatin & Taxol x 6   03/26/2018 - 04/30/2018 Radiation Therapy   Radiation treatment dates:   03/26/18, 04/02/18, 04/19/18, 04/23/18 04/30/18   Site/dose: proximal Vagina, 6 Gy in 5 fractions for a total dose of 30 Gy     04/26/2018 PET scan   1. Interval resection of the hypermetabolic endometrial primary. No discernible hypermetabolic pelvic sidewall or peritoneal metastases. 2. Stable hypermetabolic bilateral axillary lymphadenopathy. The degree of hypermetabolism in these lymph nodes remains highly suspicious for neoplasm. 3. Interval development of low level FDG uptake in 2 stable, small lymph nodes in the left groin region. This may be reactive, but close attention recommended to exclude metastatic involvement. 4. Stable non hypermetabolic low-density liver lesions and the mixed lucent and sclerotic T12 lesion is stable without hypermetabolism today.   07/09/2018 Imaging   Status post hysterectomy.   Mild axillary lymphadenopathy, improved. Nodal metastases not excluded.   Irregular wall thickening involving the anterior bladder, possibly reflecting radiation cystitis versus tumor.   Additional stable findings as above, including a 5 mm right middle lobe nodule and stable sclerosis involving the T12 vertebral body.   10/10/2018 Imaging  1. Stable exam. No findings within the abdomen or pelvis to suggest metastatic disease. 2. Unchanged appearance of sclerosis involving the T12 vertebra. 3.   Aortic Atherosclerosis (ICD10-I70.0).   10/10/2018 Imaging   Ct abdomen and pelvis 1. Stable exam. No findings within the abdomen or pelvis to suggest metastatic disease. 2. Unchanged appearance of sclerosis involving the T12 vertebra. 3.  Aortic Atherosclerosis (ICD10-I70.0).   01/16/2019 PET scan   1. Enlargement and increased activity in the left axillary, right axillary, and left inguinal adenopathy compatible with progressive malignancy. 2. Stable 4 mm right middle lobe subpleural nodule, not appreciably hypermetabolic. 3. Other imaging findings of potential clinical significance: Right kidney lower pole cyst. Aortic Atherosclerosis (ICD10-I70.0). Prominent stool throughout the colon favors constipation. Mild chronic left maxillary sinusitis.     02/01/2019 -  Chemotherapy   The patient had pembrolizumab and Lenvima for chemotherapy treatment.     04/25/2019 Imaging   Ct imaging 1. Interval response to therapy. Decrease in size of bilateral axillary and left inguinal lymph nodes. No new or progressive findings identified. 2. Stable sclerotic appearance of the T12 vertebra. 3. Aortic atherosclerosis. Lad coronary artery calcification noted.   08/30/2019 Imaging   1. Bulky RIGHT hilar lymph node, slightly increased in size from previous imaging. 2. Multiple foci of hyperenhancement in the liver. 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 Consider MRI liver on follow-up to establish a baseline for number of lesions as these will appear variable on CT follow-up based on phase of contrast acquisition in the future. 3. Stable 5 mm nodule adjacent to the fissure, minor fissure in the RIGHT middle lobe.     11/28/2019 Imaging   1. Stable exam. No new or progressive findings. 2. Stable enlarged  right axillary lymph node. 3. Stable tiny bilateral pulmonary nodules. Continued attention on follow-up recommended. 4. Scattered hypodensities in the liver parenchyma correspond to the hypervascular lesions seen on the previous study performed with intravenous contrast material. 5. Right renal cyst. 6. Aortic Atherosclerosis (ICD10-I70.0).     03/16/2020 Imaging   Increased echogenicity of renal parenchyma is noted bilaterally suggesting medical renal disease. No hydronephrosis or renal obstruction is noted   04/02/2020 Imaging   1. Right axillary lymphadenopathy is stable. Tiny bilateral pulmonary nodules are stable. Subcentimeter low-attenuation liver lesions are stable. 2. No new or progressive metastatic disease in the chest, abdomen or pelvis. 3. Aortic Atherosclerosis (ICD10-I70.0).   09/15/2020 Imaging   1. Stable RIGHT axillary lymph node with enlargement measuring 2 cm short axis. 2. Stable small nodule along the minor fissure in the RIGHT chest. 3. No new or progressive findings. 4. Aortic atherosclerosis.   03/24/2021 Imaging   1. Right axillary lymphadenopathy is stable compared to prior studies. 2. No other definite signs of metastatic disease elsewhere in the thorax. 3. Hepatic steatosis. Numerous hypervascular areas noted in the liver, incompletely characterized on today's arterial phase examination. These are similar to remote prior examination from 08/30/2019, likely to represent small benign lesions such as flash fill cavernous hemangiomas. These could be definitively evaluated with nonemergent abdominal MRI with and without IV gadolinium if of clinical concern. 4. Aortic atherosclerosis, in addition to left main and left anterior descending coronary artery disease. Assessment for potential risk factor modification, dietary therapy or pharmacologic therapy may  be warranted, if clinically indicated.   Aortic Atherosclerosis (ICD10-I70.0).   09/16/2021 Imaging   1.  Unchanged enlarged right axillary lymph node. No evidence of new lymphadenopathy or metastatic disease in the chest, abdomen, or pelvis. 2. Subcentimeter hyperenhancing lesions of the right lobe of the liver, hepatic segments VII and VIII are unchanged, measuring up to 0.7 cm. These are likely small benign incidental flash filling hemangiomata although incompletely characterized on this examination. Attention on follow-up. 3. Status post hysterectomy. 4. Cholelithiasis. 5. Coronary artery disease.   Aortic Atherosclerosis (ICD10-I70.0).     12/31/2021 - 03/04/2022 Chemotherapy   Patient is on Treatment Plan : UTERINE Lenvatinib (20) D1-21 + Pembrolizumab (200) D1 q21d     03/24/2022 Imaging   1. Similar to mild enlargement of an isolated right axillary nodal mass. 2. No new sites of disease identified. 3.  Possible constipation. 4. Coronary artery atherosclerosis. Aortic Atherosclerosis (ICD10-I70.0).   04/22/2022 Pathology Results   A. AXILLARY, RIGHT, LYMPH NODE:  -  1 of 3 lymph nodes positive for metastatic carcinoma, consistent with  the patient's known endometrial carcinoma (i.e. morphologically consistent with papillary serous carcinoma) with areas of geographic necrosis, negative for extracapsular extension on sections examined.    04/22/2022 Surgery   Preoperative Diagnosis: RIGHT AXILLARY LYMPHADENOPATHY    Postoperative Diagnosis: RIGHT AXILLARY LYMPHADENOPATHY    Surgical Procedure: RIGHT AXILLARY LYMPH NODE BIOPSY:     Operative Team Members:  Surgeon(s) and Role:    * Stechschulte, Hyman Hopes, MD - Primary     Specimen:  ID Type Source Tests Collected by Time Destination  1 : Right Axillary Lymph Node Tissue PATH Lymph nodes regional SURGICAL PATHOLOGY Stechschulte, Hyman Hopes, MD 04/22/2022 1040         Indications for Procedure: ICYSS WHISONANT is a 72 y.o. female who presented with right axillary lymphadenopathy. I recommended right axillary lymph node biopsy. The procedure  itself as well as its risk, benefits, and alternatives were discussed the patient in full. After full discussion all questions answered the patient granted consent to proceed.    Findings: Firm enlarged right axillary lymph node    Description of Procedure:    On the date stated above the patient was taken operating room placed in supine position.  Anesthesia was induced.  A timeout was completed verifying correct patient, procedure, positioning, and equipment needed for the case.  The patient's chest and right axilla was prepped and draped in the usual sterile fashion.   I made a incision along the hairline anterior to the axilla and dissected through the subcutaneous tissues into the axilla.  Open the axillary fascia and palpated for the lymph node.  The lymph node was dissected circumferentially.  The blood supply of the lymph node was controlled using two 2-0 Vicryl sutures.  Was passed off the field as a specimen.  The wound was irrigated and hemostasis was obtained.  The wound was closed in layers with 2-0 Vicryl suture running the deeper layers together and 4-0 Monocryl and Dermabond for the skin.  All sponge needle counts were correct at the end of the case.   07/21/2022 Imaging   CT CHEST ABDOMEN PELVIS W CONTRAST  Result Date: 07/20/2022 CLINICAL DATA:  Cervical/endometrial cancer/evaluate treatment response. Lung nodules. * Tracking Code: BO * EXAM: CT CHEST, ABDOMEN, AND PELVIS WITH CONTRAST TECHNIQUE: Multidetector CT imaging of the chest, abdomen and pelvis was performed following the standard protocol during bolus administration of intravenous contrast. RADIATION DOSE REDUCTION: This exam  was performed according to the departmental dose-optimization program which includes automated exposure control, adjustment of the mA and/or kV according to patient size and/or use of iterative reconstruction technique. CONTRAST:  80mL OMNIPAQUE IOHEXOL 300 MG/ML  SOLN COMPARISON:  03/23/2022 FINDINGS: CT  CHEST FINDINGS Cardiovascular: Right Port-A-Cath tip superior caval/atrial junction. Aortic atherosclerosis. Tortuous thoracic aorta. Mild cardiomegaly, without pericardial effusion. Lad coronary artery calcification. No central pulmonary embolism, on this non-dedicated study. Mediastinum/Nodes: No supraclavicular adenopathy. Resolution of right axillary adenopathy. No mediastinal or hilar adenopathy. Lungs/Pleura: No pleural fluid. A Perifissural right middle lobe 4 mm pulmonary nodule on 67/5 is unchanged and likely a subpleural lymph node. Left upper lobe 4-5 mm ground-glass nodule is unchanged on 39/5. Musculoskeletal: No acute osseous abnormality. 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 No hypermetabolism on prior PET. CT ABDOMEN PELVIS FINDINGS Hepatobiliary: Normal liver. Normal gallbladder, without biliary ductal dilatation. Pancreas: Normal, without mass or ductal dilatation. Spleen: Normal in size, without focal abnormality. Adrenals/Urinary Tract: Normal adrenal glands. Normal left kidney. Exophytic inter/lower pole right renal 3.7 cm cyst . In the absence of clinically indicated signs/symptoms require(s) no independent follow-up. No hydronephrosis. Normal urinary bladder. Stomach/Bowel: Gastric antral underdistention. Colonic stool burden suggests constipation. Normal appendix. Normal small bowel. Vascular/Lymphatic: Aortic atherosclerosis. Small abdominal retroperitoneal nodes are similar, not pathologic by size criteria. No abdominopelvic adenopathy. Reproductive: Hysterectomy.  No adnexal mass. Other: No significant free fluid. No free intraperitoneal air. No evidence of omental or peritoneal disease. Musculoskeletal: Osteopenia. Trace L4-5 anterolisthesis with prominent disc bulges at L3-4 through L5-S1. IMPRESSION: 1. Resolution of right axillary adenopathy. 2. No evidence of residual metastatic disease. 3.  Possible constipation. 4.  Coronary artery atherosclerosis. Aortic Atherosclerosis (ICD10-I70.0). Electronically Signed   By: Jeronimo Greaves M.D.   On: 07/20/2022 16:17      01/06/2023 Imaging   CT CHEST ABDOMEN PELVIS W CONTRAST  Result Date: 01/06/2023 CLINICAL DATA:  History of metastatic uterine cancer. Follow-up. * Tracking Code: BO * EXAM: CT CHEST, ABDOMEN, AND PELVIS WITH CONTRAST TECHNIQUE: Multidetector CT imaging of the chest, abdomen and pelvis was performed following the standard protocol during bolus administration of intravenous contrast. RADIATION DOSE REDUCTION: This exam was performed according to the departmental dose-optimization program which includes automated exposure control, adjustment of the mA and/or kV according to patient size and/or use of iterative reconstruction technique. CONTRAST:  OMNIPAQUE IOHEXOL 300 MG/ML  SOLN COMPARISON:  CT July 20, 2022 FINDINGS: CT CHEST FINDINGS Cardiovascular: Right chest Port-A-Cath with tip at the superior cavoatrial junction. Normal caliber thoracic aorta. No central pulmonary embolus on this nondedicated study. Normal size heart. No significant pericardial effusion/thickening. Mediastinum/Nodes: No suspicious thyroid nodule. No pathologically enlarged mediastinal, hilar or axillary lymph nodes. Mild symmetric esophageal wall thickening. Lungs/Pleura: Stable 4 mm right middle lobe perifissural pulmonary nodule on image 69/4. No new suspicious pulmonary nodules or masses. No pleural effusion. No pneumothorax. Musculoskeletal: Similar appearance of the eccentric left T12 sclerosis present dating back to 2021 without hypermetabolism on prior PET-CT. No aggressive lytic or blastic lesion of bone. CT ABDOMEN PELVIS FINDINGS Hepatobiliary: New enhancing soft tissue nodule along the anterior capsule of the lateral left lobe of the liver measuring 12 x 7 mm on image 54/2. No suspicious intrahepatic mass. Gallbladder is unremarkable. No biliary ductal dilation. Pancreas: No  pancreatic ductal dilation or evidence of acute inflammation. Spleen: No splenomegaly. Adrenals/Urinary Tract: Bilateral adrenal glands appear normal. No hydronephrosis. Stable benign  right renal cysts. Urinary bladder is unremarkable for degree of distension. Stomach/Bowel: No radiopaque enteric contrast material was administered. Stomach is unremarkable for degree of distension. No pathologic dilation of small or large bowel. No evidence of acute bowel inflammation. Vascular/Lymphatic: Aortic atherosclerosis.  Smooth IVC contours. New pathologically enlarged left inguinal lymph node measures 2.7 cm in short axis on image 110/2. Reproductive: Uterus is surgically absent without suspicious enhancing nodularity along the vaginal cuff. No suspicious adnexal mass. Other: No significant abdominopelvic free fluid. Tiny periumbilical fat containing hernia. Musculoskeletal: No aggressive lytic or blastic lesion of bone. Diffuse demineralization of bone. Multilevel degenerative change of the spine. 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 IMPRESSION: 1. New enhancing 12 x 7 mm soft tissue nodule along the anterior capsule of the lateral left lobe of the liver no significant abdominopelvic free fluid., Suspicious for peritoneal metastatic disease. 2. New pathologically enlarged left inguinal lymph node measures 2.7 cm in short axis, suspicious for nodal metastatic disease. 3. Mild symmetric esophageal wall thickening, correlate for esophagitis. 4.  Aortic Atherosclerosis (ICD10-I70.0). These results will be called to the ordering clinician or representative by the Radiologist Assistant, and communication documented in the PACS or [employer]. Electronically Signed   By: Maudry Mayhew M.D.   On: 01/06/2023 14:55        PHYSICAL EXAMINATION: ECOG PERFORMANCE STATUS: 1 - Symptomatic but completely ambulatory GENERAL:alert, no distress and comfortable I  reexamined her lymph node in her groin and they feel about the same  LABORATORY DATA:  I have reviewed the data as listed    Component Value Date/Time   NA 141 01/05/2023 1231   K 4.1 01/05/2023 1231   CL 110 01/05/2023 1231   CO2 25 01/05/2023 1231   GLUCOSE 97 01/05/2023 1231   BUN 30 (H) 01/05/2023 1231   CREATININE 1.55 (H) 01/05/2023 1231   CREATININE 1.05 (H) 04/05/2022 1420   CALCIUM 10.6 (H) 01/05/2023 1231   PROT 7.4 01/05/2023 1231   ALBUMIN 4.2 01/05/2023 1231   AST 18 01/05/2023 1231   AST 23 04/05/2022 1420   ALT 17 01/05/2023 1231   ALT 26 04/05/2022 1420   ALKPHOS 91 01/05/2023 1231   BILITOT 0.5 01/05/2023 1231   BILITOT 0.5 04/05/2022 1420   GFRNONAA 35 (L) 01/05/2023 1231   GFRNONAA 57 (L) 04/05/2022 1420   GFRAA 34 (L) 01/10/2020 1200    No results found for: "SPEP", "UPEP"  Lab Results  Component Value Date   WBC 4.5 01/05/2023   NEUTROABS 2.8 01/05/2023   HGB 12.0 01/05/2023   HCT 37.2 01/05/2023   MCV 87.7 01/05/2023   PLT 194 01/05/2023      Chemistry      Component Value Date/Time   NA 141 01/05/2023 1231   K 4.1 01/05/2023 1231   CL 110 01/05/2023 1231   CO2 25 01/05/2023 1231   BUN 30 (H) 01/05/2023 1231   CREATININE 1.55 (H) 01/05/2023 1231   CREATININE 1.05 (H) 04/05/2022 1420      Component Value Date/Time   CALCIUM 10.6 (H) 01/05/2023 1231   ALKPHOS 91 01/05/2023 1231   AST 18 01/05/2023 1231   AST 23 04/05/2022 1420   ALT 17 01/05/2023 1231   ALT 26 04/05/2022 1420   BILITOT 0.5 01/05/2023 1231   BILITOT 0.5 04/05/2022 1420       RADIOGRAPHIC STUDIES: I reviewed imaging study with the patient I have personally reviewed the radiological images  as listed and agreed with the findings in the report. CT CHEST ABDOMEN PELVIS W CONTRAST  Result Date: 01/06/2023 CLINICAL DATA:  History of metastatic uterine cancer. Follow-up. * Tracking Code: BO * EXAM: CT CHEST, ABDOMEN, AND PELVIS WITH CONTRAST TECHNIQUE: Multidetector CT  imaging of the chest, abdomen and pelvis was performed following the standard protocol during bolus administration of intravenous contrast. RADIATION DOSE REDUCTION: This exam was performed according to the departmental dose-optimization program which includes automated exposure control, adjustment of the mA and/or kV according to patient size and/or use of iterative reconstruction technique. CONTRAST:  OMNIPAQUE IOHEXOL 300 MG/ML  SOLN COMPARISON:  CT July 20, 2022 FINDINGS: CT CHEST FINDINGS Cardiovascular: Right chest Port-A-Cath with tip at the superior cavoatrial junction. Normal caliber thoracic aorta. No central pulmonary embolus on this nondedicated study. Normal size heart. No significant pericardial effusion/thickening. Mediastinum/Nodes: No suspicious thyroid nodule. No pathologically enlarged mediastinal, hilar or axillary lymph nodes. Mild symmetric esophageal wall thickening. Lungs/Pleura: Stable 4 mm right middle lobe perifissural pulmonary nodule on image 69/4. No new suspicious pulmonary nodules or masses. No pleural effusion. No pneumothorax. Musculoskeletal: Similar appearance of the eccentric left T12 sclerosis present dating back to 2021 without hypermetabolism on prior PET-CT. No aggressive lytic or blastic lesion of bone. CT ABDOMEN PELVIS FINDINGS Hepatobiliary: New enhancing soft tissue nodule along the anterior capsule of the lateral left lobe of the liver measuring 12 x 7 mm on image 54/2. No suspicious intrahepatic mass. Gallbladder is unremarkable. No biliary ductal dilation. Pancreas: No pancreatic ductal dilation or evidence of acute inflammation. Spleen: No splenomegaly. Adrenals/Urinary Tract: Bilateral adrenal glands appear normal. No hydronephrosis. Stable benign right renal cysts. Urinary bladder is unremarkable for degree of distension. Stomach/Bowel: No radiopaque enteric contrast material was administered. Stomach is unremarkable for degree of distension. No pathologic  dilation of small or large bowel. No evidence of acute bowel inflammation. Vascular/Lymphatic: Aortic atherosclerosis.  Smooth IVC contours. New pathologically enlarged left inguinal lymph node measures 2.7 cm in short axis on image 110/2. Reproductive: Uterus is surgically absent without suspicious enhancing nodularity along the vaginal cuff. No suspicious adnexal mass. Other: No significant abdominopelvic free fluid. Tiny periumbilical fat containing hernia. Musculoskeletal: No aggressive lytic or blastic lesion of bone. Diffuse demineralization of bone. Multilevel degenerative change of the spine. 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 IMPRESSION: 1. New enhancing 12 x 7 mm soft tissue nodule along the anterior capsule of the lateral left lobe of the liver no significant abdominopelvic free fluid., Suspicious for peritoneal metastatic disease. 2. New pathologically enlarged left inguinal lymph node measures 2.7 cm in short axis, suspicious for nodal metastatic disease. 3. Mild symmetric esophageal wall thickening, correlate for esophagitis. 4.  Aortic Atherosclerosis (ICD10-I70.0). These results will be called to the ordering clinician or representative by the Radiologist Assistant, and communication documented in the PACS or [employer]. Electronically Signed   By: Maudry Mayhew M.D.   On: 01/06/2023 14:55

## 2023-01-11 ENCOUNTER — Other Ambulatory Visit: Payer: Self-pay | Admitting: Hematology and Oncology

## 2023-01-11 ENCOUNTER — Encounter: Payer: Self-pay | Admitting: Hematology and Oncology

## 2023-01-11 ENCOUNTER — Other Ambulatory Visit (HOSPITAL_COMMUNITY): Payer: Self-pay

## 2023-01-11 ENCOUNTER — Telehealth: Payer: Self-pay

## 2023-01-11 DIAGNOSIS — C541 Malignant neoplasm of endometrium: Secondary | ICD-10-CM

## 2023-01-11 DIAGNOSIS — Z7189 Other specified counseling: Secondary | ICD-10-CM

## 2023-01-11 MED ORDER — LENVATINIB (10 MG DAILY DOSE) 10 MG PO CPPK: 10.0000 mg | ORAL_CAPSULE | Freq: Every day | ORAL | 11 refills | Status: DC

## 2023-01-11 NOTE — Progress Notes (Signed)
OFF PATHWAY REGIMEN - Uterine  No Change  Continue With Treatment as Ordered.  Original Decision Date/Time: 01/24/2019 08:34   [id number]:Lenvatinib 20 mg PO Daily D1-21 + Pembrolizumab 200 mg IV D1 q21 Days:   A cycle is every 21 days:     Lenvatinib      Pembrolizumab   **Always confirm dose/schedule in your pharmacy ordering system**  Patient Characteristics: Papillary Serous and Clear Cell Histology, Recurrent/Progressive Disease, Second Line, Relapse ? 6 Months From Prior Therapy Histology: Papillary Serous and Clear Cell Histology Therapeutic Status: Recurrent or Progressive Disease AJCC T Category: T3 AJCC N Category: N0 AJCC M Category: M1 AJCC 8 Stage Grouping: IVB Line of Therapy: Second Line Time to Recurrence: Relapse ? 6 Months From Prior Therapy Intent of Therapy: Non-Curative / Palliative Intent, Discussed with Patient

## 2023-01-11 NOTE — Telephone Encounter (Addendum)
Oral Oncology Pharmacist Encounter  Received new prescription for Lenvima (lenvatinib) for the treatment of endometrial cancer in conjunction with pembrolizumab, planned duration until disease progression or unacceptable toxicity.Counseled patient on administration, dosing, side effects, monitoring, drug-food interactions, safe handling, storage, and disposal.  Patient will take Lenvima 10mg  capsules, 1 capsule (10mg ) by mouth once daily, with or without food, at approximately the same time each day.  Lenvima start date: when she receives medication with Keytruda  Adverse effects include but are not limited to: hypertension, hand-foot syndrome, diarrhea, joint pain, fatigue, headache, decreased calcium, proteinuria, increased risk of blood clots, and cardiac conduction issues.   Patient will obtain anti diarrheal and alert the office of 4 or more loose stools above baseline.  Patient instructed to notify office of any upcoming invasive procedures.  Assunta Curtis will be held for 6 days prior to scheduled surgery, restart based on healing and clinical judgement.   Reviewed with patient importance of keeping a medication schedule and plan for any missed doses. No barriers to medication adherence identified.  Medication reconciliation performed and medication/allergy list updated.  Insurance authorization for Assunta Curtis has been obtained.  All questions answered. Patient has been on therapy prior and had no side effects at that time. Patient voiced understanding and appreciation.   Medication education handout placed in mail for patient. Patient knows to call the office with questions or concerns. Oral Chemotherapy Clinic phone number provided to patient.   Labs from 9/19//24 (CBC, CMP, and TSH) assessed, CrCl is 44.54 with creatinine of 1.55. No interventions needed. Prescription dose and frequency assessed for appropriateness. Patient has been on lenvima 10mg  prior and is going to start at 10mg  per MD.     Current medication list in Epic reviewed, no significant/ relevant DDIs with Lenvima identified.  Patient agreement for treatment documented in MD note on 01/11/23.  Bethel Born, PharmD Hematology/Oncology Clinical Pharmacist [facility] [phone] 01/11/2023 2:27 PM

## 2023-01-11 NOTE — Telephone Encounter (Signed)
Pt LVM wanting to inform Dr. Bertis Ruddy of her treatment plan decision. Pt stated that she would like the proceed on "the pill" and the Keytruda infusion q 3wks. Pt states that she does not wish to proceed with a biopsy.   Message sent to Dr. Bertis Ruddy with the above information.

## 2023-01-12 ENCOUNTER — Other Ambulatory Visit: Payer: Self-pay

## 2023-01-13 ENCOUNTER — Ambulatory Visit (HOSPITAL_COMMUNITY): Payer: Medicare Other

## 2023-01-13 ENCOUNTER — Other Ambulatory Visit: Payer: Medicare Other

## 2023-01-16 ENCOUNTER — Encounter: Payer: Self-pay | Admitting: Hematology and Oncology

## 2023-01-16 ENCOUNTER — Telehealth: Payer: Self-pay | Admitting: *Deleted

## 2023-01-16 NOTE — Telephone Encounter (Signed)
Patient left VM reporting left side is sore and a little painful. It began Friday 9/27. She described it as more sore than painful and worse with movements like standing up or getting into car. She said area is where she had a knot before. She wanted to know if that would change timing of first treatment being scheduled this week if she had to be seen first. Dr. Bertis Ruddy informed of patient's concerns.  Contacted patient - Informed Krista Johnson that Dr. Bertis Ruddy said she will see patient on Friday 10/4 prior to first treatment and could send in prescription for pain medication if patient wanted to try that. Patient declined offer of medication at this time and will show Dr. Bertis Ruddy where the soreness is at appt on Friday.  Advised patient to contact office for any changes or if pain gets worse. She stated understanding of directions. Informed MD of patient's response.

## 2023-01-17 ENCOUNTER — Encounter: Payer: Self-pay | Admitting: Hematology and Oncology

## 2023-01-18 ENCOUNTER — Encounter: Payer: Self-pay | Admitting: Hematology and Oncology

## 2023-01-19 ENCOUNTER — Telehealth: Payer: Self-pay | Admitting: Pharmacy Technician

## 2023-01-19 ENCOUNTER — Other Ambulatory Visit: Payer: Self-pay

## 2023-01-19 NOTE — Telephone Encounter (Signed)
Oral Oncology Patient Advocate Encounter  Patient has an active enrollment in [employer] [facility] until 04/18/23. Patient will continue to be able to receive Lenvima at no charge through this program.  Spoke with an [employer] on 01/18/23 and they stated Lenvima would be delivered to patient that day.  Jinger Neighbors, CPhT-Adv Oncology Pharmacy Patient Advocate [facility] Direct Number: [phone]  Fax: [phone]

## 2023-01-20 ENCOUNTER — Inpatient Hospital Stay: Payer: Medicare Other | Attending: Gynecology

## 2023-01-20 ENCOUNTER — Encounter: Payer: Self-pay | Admitting: Hematology and Oncology

## 2023-01-20 ENCOUNTER — Inpatient Hospital Stay (HOSPITAL_BASED_OUTPATIENT_CLINIC_OR_DEPARTMENT_OTHER): Payer: Medicare Other | Admitting: Hematology and Oncology

## 2023-01-20 ENCOUNTER — Ambulatory Visit: Payer: Medicare Other | Admitting: Hematology and Oncology

## 2023-01-20 ENCOUNTER — Inpatient Hospital Stay: Payer: Medicare Other

## 2023-01-20 VITALS — BP 158/87 | HR 73 | Temp 97.6°F | Resp 18 | Ht 66.0 in | Wt 185.2 lb

## 2023-01-20 DIAGNOSIS — Z170 Estrogen receptor positive status [ER+]: Secondary | ICD-10-CM | POA: Insufficient documentation

## 2023-01-20 DIAGNOSIS — C773 Secondary and unspecified malignant neoplasm of axilla and upper limb lymph nodes: Secondary | ICD-10-CM | POA: Insufficient documentation

## 2023-01-20 DIAGNOSIS — C541 Malignant neoplasm of endometrium: Secondary | ICD-10-CM | POA: Diagnosis not present

## 2023-01-20 DIAGNOSIS — Z7189 Other specified counseling: Secondary | ICD-10-CM

## 2023-01-20 DIAGNOSIS — Z90710 Acquired absence of both cervix and uterus: Secondary | ICD-10-CM | POA: Insufficient documentation

## 2023-01-20 DIAGNOSIS — I129 Hypertensive chronic kidney disease with stage 1 through stage 4 chronic kidney disease, or unspecified chronic kidney disease: Secondary | ICD-10-CM | POA: Insufficient documentation

## 2023-01-20 DIAGNOSIS — R7989 Other specified abnormal findings of blood chemistry: Secondary | ICD-10-CM | POA: Diagnosis not present

## 2023-01-20 DIAGNOSIS — I10 Essential (primary) hypertension: Secondary | ICD-10-CM | POA: Diagnosis not present

## 2023-01-20 DIAGNOSIS — N183 Chronic kidney disease, stage 3 unspecified: Secondary | ICD-10-CM

## 2023-01-20 DIAGNOSIS — Z5112 Encounter for antineoplastic immunotherapy: Secondary | ICD-10-CM | POA: Diagnosis not present

## 2023-01-20 DIAGNOSIS — Z79620 Long term (current) use of immunosuppressive biologic: Secondary | ICD-10-CM | POA: Insufficient documentation

## 2023-01-20 DIAGNOSIS — Z90722 Acquired absence of ovaries, bilateral: Secondary | ICD-10-CM | POA: Diagnosis not present

## 2023-01-20 LAB — CMP (CANCER CENTER ONLY)
ALT: 14 U/L (ref 0–44)
AST: 18 U/L (ref 15–41)
Albumin: 4 g/dL (ref 3.5–5.0)
Alkaline Phosphatase: 90 U/L (ref 38–126)
Anion gap: 7 (ref 5–15)
BUN: 29 mg/dL — ABNORMAL HIGH (ref 8–23)
CO2: 24 mmol/L (ref 22–32)
Calcium: 10.3 mg/dL (ref 8.9–10.3)
Chloride: 106 mmol/L (ref 98–111)
Creatinine: 1.66 mg/dL — ABNORMAL HIGH (ref 0.44–1.00)
GFR, Estimated: 33 mL/min — ABNORMAL LOW (ref >60)
Glucose, Bld: 122 mg/dL — ABNORMAL HIGH (ref 70–99)
Potassium: 4.8 mmol/L (ref 3.5–5.1)
Sodium: 137 mmol/L (ref 135–145)
Total Bilirubin: 0.4 mg/dL (ref 0.3–1.2)
Total Protein: 7.6 g/dL (ref 6.5–8.1)

## 2023-01-20 LAB — CBC WITH DIFFERENTIAL (CANCER CENTER ONLY)
Abs Immature Granulocytes: 0.07 10*3/uL (ref 0.00–0.07)
Basophils Absolute: 0 10*3/uL (ref 0.0–0.1)
Basophils Relative: 0 %
Eosinophils Absolute: 0.1 10*3/uL (ref 0.0–0.5)
Eosinophils Relative: 2 %
HCT: 34.6 % — ABNORMAL LOW (ref 36.0–46.0)
Hemoglobin: 11.1 g/dL — ABNORMAL LOW (ref 12.0–15.0)
Immature Granulocytes: 1 %
Lymphocytes Relative: 30 %
Lymphs Abs: 1.6 10*3/uL (ref 0.7–4.0)
MCH: 28.2 pg (ref 26.0–34.0)
MCHC: 32.1 g/dL (ref 30.0–36.0)
MCV: 87.8 fL (ref 80.0–100.0)
Monocytes Absolute: 0.3 10*3/uL (ref 0.1–1.0)
Monocytes Relative: 5 %
Neutro Abs: 3.3 10*3/uL (ref 1.7–7.7)
Neutrophils Relative %: 62 %
Platelet Count: 226 10*3/uL (ref 150–400)
RBC: 3.94 MIL/uL (ref 3.87–5.11)
RDW: 12.3 % (ref 11.5–15.5)
WBC Count: 5.4 10*3/uL (ref 4.0–10.5)
nRBC: 0 % (ref 0.0–0.2)

## 2023-01-20 LAB — TSH: TSH: 1.113 u[IU]/mL (ref 0.350–4.500)

## 2023-01-20 MED ORDER — SODIUM CHLORIDE 0.9% FLUSH
10.0000 mL | INTRAVENOUS | Status: DC | PRN
  Administered 2023-01-20: 10 mL

## 2023-01-20 MED ORDER — SODIUM CHLORIDE 0.9 % IV SOLN: Freq: Once | INTRAVENOUS | Status: AC

## 2023-01-20 MED ORDER — HEPARIN SOD (PORK) LOCK FLUSH 100 UNIT/ML IV SOLN
500.0000 [IU] | Freq: Once | INTRAVENOUS | Status: AC | PRN
  Administered 2023-01-20: 500 [IU]

## 2023-01-20 MED ORDER — SODIUM CHLORIDE 0.9 % IV SOLN
200.0000 mg | Freq: Once | INTRAVENOUS | Status: AC
  Administered 2023-01-20: 200 mg via INTRAVENOUS
  Filled 2023-01-20: qty 200

## 2023-01-20 NOTE — Progress Notes (Signed)
Per Dr. Bertis Ruddy, proceed with elevated Scr of 1.66

## 2023-01-20 NOTE — Assessment & Plan Note (Signed)
The patient agreed to resume treatment She will start pembrolizumab today and has started Lenvima 3 days ago We discussed importance of close blood pressure monitoring I recommend 81 mg aspirin daily to reduce risk of thrombosis

## 2023-01-20 NOTE — Assessment & Plan Note (Signed)
This could be elevated due to [facility] The patient is instructed to call me if systolic blood pressures greater than 160

## 2023-01-20 NOTE — Progress Notes (Signed)
[facility] OFFICE PROGRESS NOTE  Patient Care Team: [name], MD as PCP - General (Family Medicine)  ASSESSMENT & PLAN:  Endometrial cancer [facility]) The patient agreed to resume treatment She will start pembrolizumab today and has started Lenvima 3 days ago We discussed importance of close blood pressure monitoring I recommend 81 mg aspirin daily to reduce risk of thrombosis  Metastasis to lymph nodes (HCC) Her examination reveals stable lymphadenopathy in the inguinal region Will monitor closely  Essential hypertension This could be elevated due to [facility] The patient is instructed to call me if systolic blood pressures greater than 160  Chronic kidney disease (CKD), stage III (moderate) (HCC) She has slight worsening elevated serum creatinine Advised the patient to increase oral fluid intake and monitor blood pressure carefully  No orders of the defined types were placed in this encounter.   All questions were answered. The patient knows to call the clinic with any problems, questions or concerns. The total time spent in the appointment was 30 minutes encounter with patients including review of chart and various tests results, discussions about plan of care and coordination of care plan   Artis Delay, MD 01/20/2023 1:17 PM  INTERVAL HISTORY: Please see below for problem oriented charting. she returns for treatment today The patient had received her shipment of Lenvima She has noted her blood pressure is marginally elevated She has intermittent discomfort in the inguinal region She is also noted some slight constipation recently  REVIEW OF SYSTEMS:   Constitutional: Denies fevers, chills or abnormal weight loss Eyes: Denies blurriness of vision Ears, nose, mouth, throat, and face: Denies mucositis or sore throat Respiratory: Denies cough, dyspnea or wheezes Cardiovascular: Denies palpitation, chest discomfort or lower extremity swelling Skin: Denies  abnormal skin rashes Neurological:Denies numbness, tingling or new weaknesses Behavioral/Psych: Mood is stable, no new changes  All other systems were reviewed with the patient and are negative.  I have reviewed the past medical history, past surgical history, social history and family history with the patient and they are unchanged from previous note.  ALLERGIES:  is allergic to sulfa antibiotics and sulfamethoxazole.  MEDICATIONS:  Current Outpatient Medications  Medication Sig Dispense Refill   aspirin EC 81 MG tablet Take 81 mg by mouth daily. Swallow whole.     amLODipine-valsartan (EXFORGE) 5-160 MG tablet Take 1 tablet by mouth daily after breakfast.     Azelastine HCl 0.15 % SOLN Place 1-2 sprays into both nostrils daily as needed (sinus congestion/issues.).     calcium elemental as carbonate (BARIATRIC TUMS ULTRA) 400 MG chewable tablet Chew 2 tablets by mouth daily in the afternoon.     diphenhydrAMINE (BENADRYL) 25 MG tablet Take 25 mg by mouth daily as needed (runny nose/sinus issues.).     ergocalciferol (VITAMIN D2) 1.25 MG (50000 UT) capsule Take 1 capsule (50,000 Units total) by mouth once a week. (Patient taking differently: Take 50,000 Units by mouth 2 (two) times a week. Wednesdays & Saturdays.) 25 capsule 1   fexofenadine (ALLEGRA) 180 MG tablet Take 180 mg by mouth daily as needed for allergies or rhinitis.     glipiZIDE (GLUCOTROL XL) 2.5 MG 24 hr tablet Take 2.5 mg by mouth daily with breakfast.     glucose blood (ONETOUCH VERIO) test strip Use as instructed to check blood sugars 3 times a day. 100 each 3   lenvatinib 10 mg daily dose (LENVIMA, 10 MG DAILY DOSE,) capsule Take 1 capsule (10 mg total) by mouth daily.  30 capsule 11   levothyroxine (EUTHYROX) 100 MCG tablet Take 1 tablet (100 mcg total) by mouth daily before breakfast. 60 tablet 1   lidocaine-prilocaine (EMLA) cream Apply topically daily as needed. (Patient taking differently: Apply 1 Application topically  daily as needed (prior to port access.).) 30 g 3   OneTouch Delica Lancets 33G MISC 1 each by Other route 2 (two) times daily. Use to monitor glucose levels BID; E11.65 100 each 2   RELION PEN NEEDLES 32G X 4 MM MISC USE 1 PEN PER DAY 50 each 5   simvastatin (ZOCOR) 10 MG tablet Take 10 mg by mouth at bedtime.      spironolactone (ALDACTONE) 25 MG tablet Take 25 mg by mouth as needed.     No current facility-administered medications for this visit.    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 Pennie Rushing who obtained an endometrial biopsy showing poorly differentiated endometrial carcinoma and negative endocervical curettage   12/12/2017 Imaging   MAMMOGRAM FINDINGS: In the right axilla, a possible mass warrants further evaluation. In the left breast, no findings suspicious for malignancy.   Images were processed with CAD.   IMPRESSION: Further evaluation is suggested for possible mass in the right axilla.     12/24/2017 Imaging   Ct scan chest, abdomen and pelvis 1. Marked thickening of the endometrium (42 mm) compatible with known primary endometrial malignancy. No evidence of extrauterine invasion. 2. No pelvic or retroperitoneal adenopathy. 3. Right middle lobe 4 mm solid pulmonary nodule, for which follow-up chest CT is advised in 3-6 months. 4. Several findings that are equivocal for distant metastatic disease. Vaguely nodular heterogeneous hyperenhancement in the peripheral right liver lobe, which could represent benign transient  perfusional phenomena, with underlying liver lesions not entirely excluded. Mildly sclerotic T12 vertebral lesion. Mildly enlarged right axillary lymph node. The best single test to further evaluate these findings would be a PET-CT. Alternative tests include bone scan or thoracic MRI without and with IV contrast for the T12 osseous lesion, MRI abdomen without and with IV contrast for the liver findings, and diagnostic mammographic evaluation for the right axillary node. 5.  Aortic Atherosclerosis (ICD10-I70.0).     01/09/2018 PET scan   1. Moderate hypermetabolism corresponding to enlarging axillary node since 12/22/2017. Highly suspicious for an atypical distribution of metastatic disease. 2. No hypermetabolism to suggest hepatic or T12 osseous metastasis. 3. Hypermetabolic endometrial primary.   01/23/2018 Initial Diagnosis   Endometrial cancer (HCC)   01/23/2018 Pathology Results   1. Lymph node, sentinel, biopsy, left external iliac - NO CARCINOMA IDENTIFIED IN ONE LYMPH NODE (0/1) - SEE COMMENT 2. Lymph nodes, regional resection, right para aortic - NO CARCINOMA IDENTIFIED IN FOUR LYMPH NODES (0/4) - SEE COMMENT 3. Lymph nodes, regional resection, right pelvic - NO CARCINOMA IDENTIFIED IN EIGHT LYMPH NODES (0/8) - SEE COMMENT 4. Cul-de-sac biopsy - METASTATIC CARCINOMA 5. Uterus +/- tubes/ovaries, neoplastic, cervix, bilateral fallopian tubes and ovaries UTERUS: - MIXED SEROUS AND ENDOMETRIOID CARCINOMA - SEROSAL IMPLANTS PRESENT - LYMPHOVASCULAR SPACE INVASION PRESENT -  LEIOMYOMATA (1.5 CM; LARGEST) - SEE ONCOLOGY TABLE AND COMMENT BELOW CERVIX: - BENIGN NABOTHIAN CYSTS - NO CARCINOMA IDENTIFIED BILATERAL OVARIES: - METASTATIC CARCINOMA PRESENT ON OVARIAN SURFACE BILATERAL FALLOPIAN TUBES: - INTRALUMINAL CARCINOMA Microscopic Comment 1. -3. Cytokeratin AE1/3 was performed on the sentinel lymph nodes to exclude micrometastasis. There is no evidence of metastatic carcinoma by  immunohistochemistry. 5. 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%) Histologic Grade: N/A Myometrial invasion: Estimated less than 50% myometrial invasion (0.3 cm of myometrium involved; 1.4 cm measured thickness) Uterine Serosa Involvement: Present Cervical stromal involvement: Not identified Extent of involvement of other organs: - Fallopian tube (left within the lumen) - Ovary, left (surface involvement) - Cul-de-sac Lymphovascular invasion: Present Regional Lymph Nodes: Examined: 1 Sentinel 12 Non-sentinel 13 Total Lymph nodes with metastasis: 0 Isolated tumor cells (< 0.2 mm): 0 Micrometastasis: (> 0.2 mm and < 2.0 mm): 0 Macrometastasis: (> 2.0 mm): 0 Extracapsular extension: N/A Tumor block for ancillary studies: 5E, 5B MMR / MSI testing: Pending will be reported separately Pathologic Stage Classification (pTNM, AJCC 8th edition): pT3a, pN0 FIGO Stage: IIIA COMMENT: There is tumor present on the surface of the left ovary and within the lumen of the left fallopian tube. The carcinoma appears to be mixed with the largest component being high grade serous carcinoma. Dr. Colonel Bald reviewed the case and agrees with the above diagnosis.   01/23/2018 Surgery   Surgeon: Quinn Axe     Operation: Robotic-assisted laparoscopic total hysterectomy with bilateral salpingoophorectomy, SLN injection, mapping and biopsy, right pelvic and para-aortic lymphadenectomy   Operative Findings:  : 10-12cm bulky uterus with frank serosal involvement on posterior cul de sac peritoneum and anterior peritoneum which adhesed the bladder to the anterior uterus. Unilateral mapping on left pelvis. No grossly suspicious nodes. Normal omentum and diaphragms.    02/01/2018  Pathology Results   Lymph node, needle/core biopsy, right axilla - METASTATIC CARCINOMA - SEE COMMENT Microscopic Comment The neoplastic cells are positive for cytokeratin 7 and Pax-8 but negative for cytokeratin 5/6, cytokeratin 20, Gata-3, p63, p53 and GCDFP. Overall, the immunoprofile is consistent with metastasis from the patient's known gynecologic carcinoma.   02/01/2018 Procedure   Ultrasound-guided core biopsies of a suspicious right axillary lymph node.   02/21/2018 - 06/13/2018 Chemotherapy   The patient had carboplatin & Taxol x 6   03/26/2018 - 04/30/2018 Radiation Therapy   Radiation treatment dates:   03/26/18, 04/02/18, 04/19/18, 04/23/18 04/30/18   Site/dose: proximal Vagina, 6 Gy in 5 fractions for a total dose of 30 Gy     04/26/2018 PET scan   1. Interval resection of the hypermetabolic endometrial primary. No discernible hypermetabolic pelvic sidewall or peritoneal metastases. 2. Stable hypermetabolic bilateral axillary lymphadenopathy. The degree of hypermetabolism in these lymph nodes remains highly suspicious for neoplasm. 3. Interval development of low level FDG uptake in 2 stable, small lymph nodes in the left groin region. This may be reactive, but close attention recommended to exclude metastatic involvement. 4. Stable non hypermetabolic low-density liver lesions and the mixed lucent and sclerotic T12 lesion is stable without hypermetabolism today.   07/09/2018 Imaging   Status post hysterectomy.   Mild axillary lymphadenopathy, improved. Nodal metastases not excluded.   Irregular wall thickening involving the anterior bladder, possibly reflecting radiation cystitis versus tumor.   Additional stable findings as above, including a  5 mm right middle lobe nodule and stable sclerosis involving the T12 vertebral body.   10/10/2018 Imaging   1. Stable exam. No findings within the abdomen or pelvis to suggest metastatic disease. 2. Unchanged appearance of sclerosis  involving the T12 vertebra. 3.  Aortic Atherosclerosis (ICD10-I70.0).   10/10/2018 Imaging   Ct abdomen and pelvis 1. Stable exam. No findings within the abdomen or pelvis to suggest metastatic disease. 2. Unchanged appearance of sclerosis involving the T12 vertebra. 3.  Aortic Atherosclerosis (ICD10-I70.0).   01/16/2019 PET scan   1. Enlargement and increased activity in the left axillary, right axillary, and left inguinal adenopathy compatible with progressive malignancy. 2. Stable 4 mm right middle lobe subpleural nodule, not appreciably hypermetabolic. 3. Other imaging findings of potential clinical significance: Right kidney lower pole cyst. Aortic Atherosclerosis (ICD10-I70.0). Prominent stool throughout the colon favors constipation. Mild chronic left maxillary sinusitis.     02/01/2019 -  Chemotherapy   The patient had pembrolizumab and Lenvima for chemotherapy treatment.     04/25/2019 Imaging   Ct imaging 1. Interval response to therapy. Decrease in size of bilateral axillary and left inguinal lymph nodes. No new or progressive findings identified. 2. Stable sclerotic appearance of the T12 vertebra. 3. Aortic atherosclerosis. Lad coronary artery calcification noted.   08/30/2019 Imaging   1. Bulky RIGHT hilar lymph node, slightly increased in size from previous imaging. 2. Multiple foci of hyperenhancement in the liver. 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 Consider MRI liver on follow-up to establish a baseline for number of lesions as these will appear variable on CT follow-up based on phase of contrast acquisition in the future. 3. Stable 5 mm nodule adjacent to the fissure, minor fissure in the RIGHT middle lobe.     11/28/2019 Imaging   1. Stable exam. No new or progressive  findings. 2. Stable enlarged right axillary lymph node. 3. Stable tiny bilateral pulmonary nodules. Continued attention on follow-up recommended. 4. Scattered hypodensities in the liver parenchyma correspond to the hypervascular lesions seen on the previous study performed with intravenous contrast material. 5. Right renal cyst. 6. Aortic Atherosclerosis (ICD10-I70.0).     03/16/2020 Imaging   Increased echogenicity of renal parenchyma is noted bilaterally suggesting medical renal disease. No hydronephrosis or renal obstruction is noted   04/02/2020 Imaging   1. Right axillary lymphadenopathy is stable. Tiny bilateral pulmonary nodules are stable. Subcentimeter low-attenuation liver lesions are stable. 2. No new or progressive metastatic disease in the chest, abdomen or pelvis. 3. Aortic Atherosclerosis (ICD10-I70.0).   09/15/2020 Imaging   1. Stable RIGHT axillary lymph node with enlargement measuring 2 cm short axis. 2. Stable small nodule along the minor fissure in the RIGHT chest. 3. No new or progressive findings. 4. Aortic atherosclerosis.   03/24/2021 Imaging   1. Right axillary lymphadenopathy is stable compared to prior studies. 2. No other definite signs of metastatic disease elsewhere in the thorax. 3. Hepatic steatosis. Numerous hypervascular areas noted in the liver, incompletely characterized on today's arterial phase examination. These are similar to remote prior examination from 08/30/2019, likely to represent small benign lesions such as flash fill cavernous hemangiomas. These could be definitively evaluated with nonemergent abdominal MRI with and without IV gadolinium if of clinical concern. 4. Aortic atherosclerosis, in addition to left  main and left anterior descending coronary artery disease. Assessment for potential risk factor modification, dietary therapy or pharmacologic therapy may be warranted, if clinically indicated.   Aortic Atherosclerosis (ICD10-I70.0).    09/16/2021 Imaging   1. Unchanged enlarged right axillary lymph node. No evidence of new lymphadenopathy or metastatic disease in the chest, abdomen, or pelvis. 2. Subcentimeter hyperenhancing lesions of the right lobe of the liver, hepatic segments VII and VIII are unchanged, measuring up to 0.7 cm. These are likely small benign incidental flash filling hemangiomata although incompletely characterized on this examination. Attention on follow-up. 3. Status post hysterectomy. 4. Cholelithiasis. 5. Coronary artery disease.   Aortic Atherosclerosis (ICD10-I70.0).     12/31/2021 - 03/04/2022 Chemotherapy   Patient is on Treatment Plan : UTERINE Lenvatinib (20) D1-21 + Pembrolizumab (200) D1 q21d     03/24/2022 Imaging   1. Similar to mild enlargement of an isolated right axillary nodal mass. 2. No new sites of disease identified. 3.  Possible constipation. 4. Coronary artery atherosclerosis. Aortic Atherosclerosis (ICD10-I70.0).   04/22/2022 Pathology Results   A. AXILLARY, RIGHT, LYMPH NODE:  -  1 of 3 lymph nodes positive for metastatic carcinoma, consistent with  the patient's known endometrial carcinoma (i.e. morphologically consistent with papillary serous carcinoma) with areas of geographic necrosis, negative for extracapsular extension on sections examined.    04/22/2022 Surgery   Preoperative Diagnosis: RIGHT AXILLARY LYMPHADENOPATHY    Postoperative Diagnosis: RIGHT AXILLARY LYMPHADENOPATHY    Surgical Procedure: RIGHT AXILLARY LYMPH NODE BIOPSY:     Operative Team Members:  Surgeon(s) and Role:    * Stechschulte, Hyman Hopes, MD - Primary     Specimen:  ID Type Source Tests Collected by Time Destination  1 : Right Axillary Lymph Node Tissue PATH Lymph nodes regional SURGICAL PATHOLOGY Stechschulte, Hyman Hopes, MD 04/22/2022 1040         Indications for Procedure: Krista Johnson is a 72 y.o. female who presented with right axillary lymphadenopathy. I recommended right axillary lymph  node biopsy. The procedure itself as well as its risk, benefits, and alternatives were discussed the patient in full. After full discussion all questions answered the patient granted consent to proceed.    Findings: Firm enlarged right axillary lymph node    Description of Procedure:    On the date stated above the patient was taken operating room placed in supine position.  Anesthesia was induced.  A timeout was completed verifying correct patient, procedure, positioning, and equipment needed for the case.  The patient's chest and right axilla was prepped and draped in the usual sterile fashion.   I made a incision along the hairline anterior to the axilla and dissected through the subcutaneous tissues into the axilla.  Open the axillary fascia and palpated for the lymph node.  The lymph node was dissected circumferentially.  The blood supply of the lymph node was controlled using two 2-0 Vicryl sutures.  Was passed off the field as a specimen.  The wound was irrigated and hemostasis was obtained.  The wound was closed in layers with 2-0 Vicryl suture running the deeper layers together and 4-0 Monocryl and Dermabond for the skin.  All sponge needle counts were correct at the end of the case.   07/21/2022 Imaging   CT CHEST ABDOMEN PELVIS W CONTRAST  Result Date: 07/20/2022 CLINICAL DATA:  Cervical/endometrial cancer/evaluate treatment response. Lung nodules. * Tracking Code: BO * EXAM: CT CHEST, ABDOMEN, AND PELVIS WITH CONTRAST TECHNIQUE: Multidetector CT imaging of the chest,  abdomen and pelvis was performed following the standard protocol during bolus administration of intravenous contrast. RADIATION DOSE REDUCTION: This exam was performed according to the departmental dose-optimization program which includes automated exposure control, adjustment of the mA and/or kV according to patient size and/or use of iterative reconstruction technique. CONTRAST:  80mL OMNIPAQUE IOHEXOL 300 MG/ML  SOLN COMPARISON:   03/23/2022 FINDINGS: CT CHEST FINDINGS Cardiovascular: Right Port-A-Cath tip superior caval/atrial junction. Aortic atherosclerosis. Tortuous thoracic aorta. Mild cardiomegaly, without pericardial effusion. Lad coronary artery calcification. No central pulmonary embolism, on this non-dedicated study. Mediastinum/Nodes: No supraclavicular adenopathy. Resolution of right axillary adenopathy. No mediastinal or hilar adenopathy. Lungs/Pleura: No pleural fluid. A Perifissural right middle lobe 4 mm pulmonary nodule on 67/5 is unchanged and likely a subpleural lymph node. Left upper lobe 4-5 mm ground-glass nodule is unchanged on 39/5. Musculoskeletal: No acute osseous abnormality. 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 No hypermetabolism on prior PET. CT ABDOMEN PELVIS FINDINGS Hepatobiliary: Normal liver. Normal gallbladder, without biliary ductal dilatation. Pancreas: Normal, without mass or ductal dilatation. Spleen: Normal in size, without focal abnormality. Adrenals/Urinary Tract: Normal adrenal glands. Normal left kidney. Exophytic inter/lower pole right renal 3.7 cm cyst . In the absence of clinically indicated signs/symptoms require(s) no independent follow-up. No hydronephrosis. Normal urinary bladder. Stomach/Bowel: Gastric antral underdistention. Colonic stool burden suggests constipation. Normal appendix. Normal small bowel. Vascular/Lymphatic: Aortic atherosclerosis. Small abdominal retroperitoneal nodes are similar, not pathologic by size criteria. No abdominopelvic adenopathy. Reproductive: Hysterectomy.  No adnexal mass. Other: No significant free fluid. No free intraperitoneal air. No evidence of omental or peritoneal disease. Musculoskeletal: Osteopenia. Trace L4-5 anterolisthesis with prominent disc bulges at L3-4 through L5-S1. IMPRESSION: 1. Resolution of right axillary adenopathy. 2. No evidence of residual metastatic disease. 3.   Possible constipation. 4. Coronary artery atherosclerosis. Aortic Atherosclerosis (ICD10-I70.0). Electronically Signed   By: Jeronimo Greaves M.D.   On: 07/20/2022 16:17      01/06/2023 Imaging   CT CHEST ABDOMEN PELVIS W CONTRAST  Result Date: 01/06/2023 CLINICAL DATA:  History of metastatic uterine cancer. Follow-up. * Tracking Code: BO * EXAM: CT CHEST, ABDOMEN, AND PELVIS WITH CONTRAST TECHNIQUE: Multidetector CT imaging of the chest, abdomen and pelvis was performed following the standard protocol during bolus administration of intravenous contrast. RADIATION DOSE REDUCTION: This exam was performed according to the departmental dose-optimization program which includes automated exposure control, adjustment of the mA and/or kV according to patient size and/or use of iterative reconstruction technique. CONTRAST:  OMNIPAQUE IOHEXOL 300 MG/ML  SOLN COMPARISON:  CT July 20, 2022 FINDINGS: CT CHEST FINDINGS Cardiovascular: Right chest Port-A-Cath with tip at the superior cavoatrial junction. Normal caliber thoracic aorta. No central pulmonary embolus on this nondedicated study. Normal size heart. No significant pericardial effusion/thickening. Mediastinum/Nodes: No suspicious thyroid nodule. No pathologically enlarged mediastinal, hilar or axillary lymph nodes. Mild symmetric esophageal wall thickening. Lungs/Pleura: Stable 4 mm right middle lobe perifissural pulmonary nodule on image 69/4. No new suspicious pulmonary nodules or masses. No pleural effusion. No pneumothorax. Musculoskeletal: Similar appearance of the eccentric left T12 sclerosis present dating back to 2021 without hypermetabolism on prior PET-CT. No aggressive lytic or blastic lesion of bone. CT ABDOMEN PELVIS FINDINGS Hepatobiliary: New enhancing soft tissue nodule along the anterior capsule of the lateral left lobe of the liver measuring 12 x 7 mm on image 54/2. No suspicious intrahepatic mass. Gallbladder is unremarkable. No biliary ductal  dilation. Pancreas: No pancreatic ductal  dilation or evidence of acute inflammation. Spleen: No splenomegaly. Adrenals/Urinary Tract: Bilateral adrenal glands appear normal. No hydronephrosis. Stable benign right renal cysts. Urinary bladder is unremarkable for degree of distension. Stomach/Bowel: No radiopaque enteric contrast material was administered. Stomach is unremarkable for degree of distension. No pathologic dilation of small or large bowel. No evidence of acute bowel inflammation. Vascular/Lymphatic: Aortic atherosclerosis.  Smooth IVC contours. New pathologically enlarged left inguinal lymph node measures 2.7 cm in short axis on image 110/2. Reproductive: Uterus is surgically absent without suspicious enhancing nodularity along the vaginal cuff. No suspicious adnexal mass. Other: No significant abdominopelvic free fluid. Tiny periumbilical fat containing hernia. Musculoskeletal: No aggressive lytic or blastic lesion of bone. Diffuse demineralization of bone. Multilevel degenerative change of the spine. 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 IMPRESSION: 1. New enhancing 12 x 7 mm soft tissue nodule along the anterior capsule of the lateral left lobe of the liver no significant abdominopelvic free fluid., Suspicious for peritoneal metastatic disease. 2. New pathologically enlarged left inguinal lymph node measures 2.7 cm in short axis, suspicious for nodal metastatic disease. 3. Mild symmetric esophageal wall thickening, correlate for esophagitis. 4.  Aortic Atherosclerosis (ICD10-I70.0). These results will be called to the ordering clinician or representative by the Radiologist Assistant, and communication documented in the PACS or [employer]. Electronically Signed   By: Maudry Mayhew M.D.   On: 01/06/2023 14:55      01/20/2023 -  Chemotherapy   Patient is on Treatment Plan : UTERINE Lenvatinib (20) D1-21 + Pembrolizumab (200) D1 q21d        PHYSICAL EXAMINATION: ECOG PERFORMANCE STATUS: 1 - Symptomatic but completely ambulatory  Vitals:   01/20/23 1255  BP: (!) 158/87  Pulse: 73  Resp: 18  Temp: 97.6 F (36.4 C)  SpO2: 99%   Filed Weights   01/20/23 1255  Weight: 185 lb 3.2 oz (84 kg)    GENERAL:alert, no distress and comfortable Previously palpable lymphadenopathy in the inguinal region is stable NEURO: alert & oriented x 3 with fluent speech, no focal motor/sensory deficits  LABORATORY DATA:  I have reviewed the data as listed    Component Value Date/Time   NA 137 01/20/2023 1239   K 4.8 01/20/2023 1239   CL 106 01/20/2023 1239   CO2 24 01/20/2023 1239   GLUCOSE 122 (H) 01/20/2023 1239   BUN 29 (H) 01/20/2023 1239   CREATININE 1.66 (H) 01/20/2023 1239   CALCIUM 10.3 01/20/2023 1239   PROT 7.6 01/20/2023 1239   ALBUMIN 4.0 01/20/2023 1239   AST 18 01/20/2023 1239   ALT 14 01/20/2023 1239   ALKPHOS 90 01/20/2023 1239   BILITOT 0.4 01/20/2023 1239   GFRNONAA 33 (L) 01/20/2023 1239   GFRAA 34 (L) 01/10/2020 1200    No results found for: "SPEP", "UPEP"  Lab Results  Component Value Date   WBC 5.4 01/20/2023   NEUTROABS 3.3 01/20/2023   HGB 11.1 (L) 01/20/2023   HCT 34.6 (L) 01/20/2023   MCV 87.8 01/20/2023   PLT 226 01/20/2023      Chemistry      Component Value Date/Time   NA 137 01/20/2023 1239   K 4.8 01/20/2023 1239   CL 106 01/20/2023 1239   CO2 24 01/20/2023 1239   BUN 29 (H) 01/20/2023 1239   CREATININE 1.66 (H) 01/20/2023 1239      Component Value Date/Time   CALCIUM 10.3 01/20/2023 1239   ALKPHOS 90 01/20/2023  1239   AST 18 01/20/2023 1239   ALT 14 01/20/2023 1239   BILITOT 0.4 01/20/2023 1239       RADIOGRAPHIC STUDIES: I have personally reviewed the radiological images as listed and agreed with the findings in the report. CT CHEST ABDOMEN PELVIS W CONTRAST  Result Date: 01/06/2023 CLINICAL DATA:  History of metastatic uterine cancer. Follow-up. * Tracking Code:  BO * EXAM: CT CHEST, ABDOMEN, AND PELVIS WITH CONTRAST TECHNIQUE: Multidetector CT imaging of the chest, abdomen and pelvis was performed following the standard protocol during bolus administration of intravenous contrast. RADIATION DOSE REDUCTION: This exam was performed according to the departmental dose-optimization program which includes automated exposure control, adjustment of the mA and/or kV according to patient size and/or use of iterative reconstruction technique. CONTRAST:  OMNIPAQUE IOHEXOL 300 MG/ML  SOLN COMPARISON:  CT July 20, 2022 FINDINGS: CT CHEST FINDINGS Cardiovascular: Right chest Port-A-Cath with tip at the superior cavoatrial junction. Normal caliber thoracic aorta. No central pulmonary embolus on this nondedicated study. Normal size heart. No significant pericardial effusion/thickening. Mediastinum/Nodes: No suspicious thyroid nodule. No pathologically enlarged mediastinal, hilar or axillary lymph nodes. Mild symmetric esophageal wall thickening. Lungs/Pleura: Stable 4 mm right middle lobe perifissural pulmonary nodule on image 69/4. No new suspicious pulmonary nodules or masses. No pleural effusion. No pneumothorax. Musculoskeletal: Similar appearance of the eccentric left T12 sclerosis present dating back to 2021 without hypermetabolism on prior PET-CT. No aggressive lytic or blastic lesion of bone. CT ABDOMEN PELVIS FINDINGS Hepatobiliary: New enhancing soft tissue nodule along the anterior capsule of the lateral left lobe of the liver measuring 12 x 7 mm on image 54/2. No suspicious intrahepatic mass. Gallbladder is unremarkable. No biliary ductal dilation. Pancreas: No pancreatic ductal dilation or evidence of acute inflammation. Spleen: No splenomegaly. Adrenals/Urinary Tract: Bilateral adrenal glands appear normal. No hydronephrosis. Stable benign right renal cysts. Urinary bladder is unremarkable for degree of distension. Stomach/Bowel: No radiopaque enteric contrast material  was administered. Stomach is unremarkable for degree of distension. No pathologic dilation of small or large bowel. No evidence of acute bowel inflammation. Vascular/Lymphatic: Aortic atherosclerosis.  Smooth IVC contours. New pathologically enlarged left inguinal lymph node measures 2.7 cm in short axis on image 110/2. Reproductive: Uterus is surgically absent without suspicious enhancing nodularity along the vaginal cuff. No suspicious adnexal mass. Other: No significant abdominopelvic free fluid. Tiny periumbilical fat containing hernia. Musculoskeletal: No aggressive lytic or blastic lesion of bone. Diffuse demineralization of bone. Multilevel degenerative change of the spine. 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 IMPRESSION: 1. New enhancing 12 x 7 mm soft tissue nodule along the anterior capsule of the lateral left lobe of the liver no significant abdominopelvic free fluid., Suspicious for peritoneal metastatic disease. 2. New pathologically enlarged left inguinal lymph node measures 2.7 cm in short axis, suspicious for nodal metastatic disease. 3. Mild symmetric esophageal wall thickening, correlate for esophagitis. 4.  Aortic Atherosclerosis (ICD10-I70.0). These results will be called to the ordering clinician or representative by the Radiologist Assistant, and communication documented in the PACS or [employer]. Electronically Signed   By: Maudry Mayhew M.D.   On: 01/06/2023 14:55

## 2023-01-20 NOTE — Assessment & Plan Note (Signed)
Her examination reveals stable lymphadenopathy in the inguinal region Will monitor closely

## 2023-01-20 NOTE — Patient Instructions (Signed)
[facility]  Discharge Instructions: Thank you for choosing [facility] to provide your oncology and hematology care.   If you have a lab appointment with the Cancer Center, please go directly to the Cancer Center and check in at the registration area.   Wear comfortable clothing and clothing appropriate for easy access to any Portacath or PICC line.   We strive to give you quality time with your provider. You may need to reschedule your appointment if you arrive late (15 or more minutes).  Arriving late affects you and other patients whose appointments are after yours.  Also, if you miss three or more appointments without notifying the office, you may be dismissed from the clinic at the provider's discretion.      For prescription refill requests, have your pharmacy contact our office and allow 72 hours for refills to be completed.    Today you received the following chemotherapy and/or immunotherapy agents: pembrolizumab      To help prevent nausea and vomiting after your treatment, we encourage you to take your nausea medication as directed.  BELOW ARE SYMPTOMS THAT SHOULD BE REPORTED IMMEDIATELY: *FEVER GREATER THAN 100.4 F (38 C) OR HIGHER *CHILLS OR SWEATING *NAUSEA AND VOMITING THAT IS NOT CONTROLLED WITH YOUR NAUSEA MEDICATION *UNUSUAL SHORTNESS OF BREATH *UNUSUAL BRUISING OR BLEEDING *URINARY PROBLEMS (pain or burning when urinating, or frequent urination) *BOWEL PROBLEMS (unusual diarrhea, constipation, pain near the anus) TENDERNESS IN MOUTH AND THROAT WITH OR WITHOUT PRESENCE OF ULCERS (sore throat, sores in mouth, or a toothache) UNUSUAL RASH, SWELLING OR PAIN  UNUSUAL VAGINAL DISCHARGE OR ITCHING   Items with * indicate a potential emergency and should be followed up as soon as possible or go to the Emergency Department if any problems should occur.  Please show the CHEMOTHERAPY ALERT CARD or IMMUNOTHERAPY ALERT CARD at  check-in to the Emergency Department and triage nurse.  Should you have questions after your visit or need to cancel or reschedule your appointment, please contact [facility]  Dept: [phone]  and follow the prompts.  Office hours are 8:00 a.m. to 4:30 p.m. Monday - Friday. Please note that voicemails left after 4:00 p.m. may not be returned until the following business day.  We are closed weekends and major holidays. You have access to a nurse at all times for urgent questions. Please call the main number to the clinic Dept: [phone] and follow the prompts.   For any non-urgent questions, you may also contact your provider using MyChart. We now offer e-Visits for anyone 18 and older to request care online for non-urgent symptoms. For details visit [email or web address].   Also download the MyChart app! Go to the app store, search "MyChart", open the app, select , and log in with your MyChart username and password.   

## 2023-01-20 NOTE — Assessment & Plan Note (Signed)
She has slight worsening elevated serum creatinine Advised the patient to increase oral fluid intake and monitor blood pressure carefully

## 2023-01-21 LAB — T4: T4, Total: 10.5 ug/dL (ref 4.5–12.0)

## 2023-02-03 ENCOUNTER — Encounter: Payer: Self-pay | Admitting: Hematology and Oncology

## 2023-02-03 NOTE — Telephone Encounter (Signed)
TC

## 2023-02-07 DIAGNOSIS — E559 Vitamin D deficiency, unspecified: Secondary | ICD-10-CM | POA: Diagnosis not present

## 2023-02-07 DIAGNOSIS — G620 Drug-induced polyneuropathy: Secondary | ICD-10-CM | POA: Diagnosis not present

## 2023-02-07 DIAGNOSIS — E1169 Type 2 diabetes mellitus with other specified complication: Secondary | ICD-10-CM | POA: Diagnosis not present

## 2023-02-07 DIAGNOSIS — E1122 Type 2 diabetes mellitus with diabetic chronic kidney disease: Secondary | ICD-10-CM | POA: Diagnosis not present

## 2023-02-07 DIAGNOSIS — Z0000 Encounter for general adult medical examination without abnormal findings: Secondary | ICD-10-CM | POA: Diagnosis not present

## 2023-02-07 LAB — CBC AND DIFFERENTIAL: Hemoglobin: 8 — AB (ref 12.0–16.0)

## 2023-02-10 ENCOUNTER — Ambulatory Visit: Payer: Medicare Other | Admitting: Hematology and Oncology

## 2023-02-10 ENCOUNTER — Ambulatory Visit: Payer: Medicare Other

## 2023-02-10 ENCOUNTER — Other Ambulatory Visit: Payer: Medicare Other

## 2023-02-14 ENCOUNTER — Telehealth: Payer: Self-pay

## 2023-02-14 NOTE — Telephone Encounter (Signed)
Called and left a message asking her to call the office back. She left a message yesterday that she needs assistance with supplies.

## 2023-02-15 ENCOUNTER — Other Ambulatory Visit: Payer: Self-pay | Admitting: *Deleted

## 2023-02-15 ENCOUNTER — Telehealth: Payer: Self-pay | Admitting: Pharmacy Technician

## 2023-02-15 DIAGNOSIS — C541 Malignant neoplasm of endometrium: Secondary | ICD-10-CM

## 2023-02-15 MED ORDER — LENVATINIB (10 MG DAILY DOSE) 10 MG PO CPPK: 10.0000 mg | ORAL_CAPSULE | Freq: Every day | ORAL | 11 refills | Status: DC

## 2023-02-15 NOTE — Telephone Encounter (Signed)
Received request from C. Medlin, CPhT: Send prescription for Lenvima to [facility]. Rx for [facility] sent by Dr. Bertis Ruddy on  01/11/23 w/11 refills, but pharmacy requested Rx resent to start her re-enrollment process for 2025.  Rx sent as requested

## 2023-02-15 NOTE — Telephone Encounter (Signed)
Oral Oncology Patient Advocate Encounter   Received notification that patient is due for re-enrollment for assistance for Lenvima through [facility].   Re-enrollment process has been initiated and will require an e-rx be sent to [facility].  [place] phone number [phone].   I will continue to follow until final determination.  Jinger Neighbors, CPhT-Adv Oncology Pharmacy Patient Advocate [facility] Direct Number: [phone]  Fax: [phone]

## 2023-02-16 ENCOUNTER — Inpatient Hospital Stay: Payer: Medicare Other

## 2023-02-16 ENCOUNTER — Encounter: Payer: Self-pay | Admitting: Hematology and Oncology

## 2023-02-16 ENCOUNTER — Other Ambulatory Visit: Payer: Self-pay

## 2023-02-16 ENCOUNTER — Inpatient Hospital Stay (HOSPITAL_BASED_OUTPATIENT_CLINIC_OR_DEPARTMENT_OTHER): Payer: Medicare Other | Admitting: Hematology and Oncology

## 2023-02-16 VITALS — BP 163/100 | HR 71

## 2023-02-16 VITALS — BP 182/108 | HR 83 | Resp 18 | Ht 66.0 in

## 2023-02-16 DIAGNOSIS — Z7189 Other specified counseling: Secondary | ICD-10-CM

## 2023-02-16 DIAGNOSIS — C773 Secondary and unspecified malignant neoplasm of axilla and upper limb lymph nodes: Secondary | ICD-10-CM

## 2023-02-16 DIAGNOSIS — R7989 Other specified abnormal findings of blood chemistry: Secondary | ICD-10-CM | POA: Diagnosis not present

## 2023-02-16 DIAGNOSIS — Z5112 Encounter for antineoplastic immunotherapy: Secondary | ICD-10-CM | POA: Diagnosis not present

## 2023-02-16 DIAGNOSIS — I10 Essential (primary) hypertension: Secondary | ICD-10-CM

## 2023-02-16 DIAGNOSIS — I129 Hypertensive chronic kidney disease with stage 1 through stage 4 chronic kidney disease, or unspecified chronic kidney disease: Secondary | ICD-10-CM | POA: Diagnosis not present

## 2023-02-16 DIAGNOSIS — C541 Malignant neoplasm of endometrium: Secondary | ICD-10-CM | POA: Diagnosis not present

## 2023-02-16 DIAGNOSIS — N183 Chronic kidney disease, stage 3 unspecified: Secondary | ICD-10-CM | POA: Diagnosis not present

## 2023-02-16 LAB — CBC WITH DIFFERENTIAL (CANCER CENTER ONLY)
Abs Immature Granulocytes: 0.03 10*3/uL (ref 0.00–0.07)
Basophils Absolute: 0 10*3/uL (ref 0.0–0.1)
Basophils Relative: 1 %
Eosinophils Absolute: 0.1 10*3/uL (ref 0.0–0.5)
Eosinophils Relative: 2 %
HCT: 35.9 % — ABNORMAL LOW (ref 36.0–46.0)
Hemoglobin: 12 g/dL (ref 12.0–15.0)
Immature Granulocytes: 1 %
Lymphocytes Relative: 33 %
Lymphs Abs: 1.4 10*3/uL (ref 0.7–4.0)
MCH: 28.8 pg (ref 26.0–34.0)
MCHC: 33.4 g/dL (ref 30.0–36.0)
MCV: 86.3 fL (ref 80.0–100.0)
Monocytes Absolute: 0.2 10*3/uL (ref 0.1–1.0)
Monocytes Relative: 6 %
Neutro Abs: 2.4 10*3/uL (ref 1.7–7.7)
Neutrophils Relative %: 57 %
Platelet Count: 157 10*3/uL (ref 150–400)
RBC: 4.16 MIL/uL (ref 3.87–5.11)
RDW: 13.2 % (ref 11.5–15.5)
WBC Count: 4.2 10*3/uL (ref 4.0–10.5)
nRBC: 0 % (ref 0.0–0.2)

## 2023-02-16 LAB — CMP (CANCER CENTER ONLY)
ALT: 15 U/L (ref 0–44)
AST: 16 U/L (ref 15–41)
Albumin: 3.9 g/dL (ref 3.5–5.0)
Alkaline Phosphatase: 98 U/L (ref 38–126)
Anion gap: 7 (ref 5–15)
BUN: 33 mg/dL — ABNORMAL HIGH (ref 8–23)
CO2: 23 mmol/L (ref 22–32)
Calcium: 9.5 mg/dL (ref 8.9–10.3)
Chloride: 111 mmol/L (ref 98–111)
Creatinine: 1.38 mg/dL — ABNORMAL HIGH (ref 0.44–1.00)
GFR, Estimated: 41 mL/min — ABNORMAL LOW (ref >60)
Glucose, Bld: 222 mg/dL — ABNORMAL HIGH (ref 70–99)
Potassium: 3.9 mmol/L (ref 3.5–5.1)
Sodium: 141 mmol/L (ref 135–145)
Total Bilirubin: 0.6 mg/dL (ref 0.3–1.2)
Total Protein: 6.7 g/dL (ref 6.5–8.1)

## 2023-02-16 MED ORDER — HEPARIN SOD (PORK) LOCK FLUSH 100 UNIT/ML IV SOLN
500.0000 [IU] | Freq: Once | INTRAVENOUS | Status: AC | PRN
Start: 2023-02-16 — End: 2023-02-16
  Administered 2023-02-16: 500 [IU]

## 2023-02-16 MED ORDER — SODIUM CHLORIDE 0.9% FLUSH
10.0000 mL | Freq: Once | INTRAVENOUS | Status: AC
  Administered 2023-02-16: 10 mL

## 2023-02-16 MED ORDER — SODIUM CHLORIDE 0.9% FLUSH
10.0000 mL | INTRAVENOUS | Status: DC | PRN
  Administered 2023-02-16: 10 mL

## 2023-02-16 MED ORDER — SODIUM CHLORIDE 0.9 % IV SOLN
200.0000 mg | Freq: Once | INTRAVENOUS | Status: AC
  Administered 2023-02-16: 200 mg via INTRAVENOUS
  Filled 2023-02-16: qty 200

## 2023-02-16 MED ORDER — SODIUM CHLORIDE 0.9 % IV SOLN: Freq: Once | INTRAVENOUS | Status: AC

## 2023-02-16 MED ORDER — LIDOCAINE-PRILOCAINE 2.5-2.5 % EX CREA: 1.0000 | TOPICAL_CREAM | Freq: Every day | CUTANEOUS | 3 refills | Status: DC | PRN

## 2023-02-16 NOTE — Assessment & Plan Note (Signed)
Clinically, she is responding to treatment The size of the palpable inguinal lymph node has reduced by almost 50% She has no side effects from treatment so far apart from intermittent hypertension We will proceed with treatment as scheduled

## 2023-02-16 NOTE — Assessment & Plan Note (Signed)
Blood pressure is high this morning but at home is typically within normal range Her blood pressure that was documented 9 days ago was normal We will continue close monitoring of her blood pressure

## 2023-02-16 NOTE — Patient Instructions (Signed)
[facility]  Discharge Instructions: Thank you for choosing [facility] to provide your oncology and hematology care.   If you have a lab appointment with the Cancer Center, please go directly to the Cancer Center and check in at the registration area.   Wear comfortable clothing and clothing appropriate for easy access to any Portacath or PICC line.   We strive to give you quality time with your provider. You may need to reschedule your appointment if you arrive late (15 or more minutes).  Arriving late affects you and other patients whose appointments are after yours.  Also, if you miss three or more appointments without notifying the office, you may be dismissed from the clinic at the provider's discretion.      For prescription refill requests, have your pharmacy contact our office and allow 72 hours for refills to be completed.    Today you received the following chemotherapy and/or immunotherapy agents: pembrolizumab      To help prevent nausea and vomiting after your treatment, we encourage you to take your nausea medication as directed.  BELOW ARE SYMPTOMS THAT SHOULD BE REPORTED IMMEDIATELY: *FEVER GREATER THAN 100.4 F (38 C) OR HIGHER *CHILLS OR SWEATING *NAUSEA AND VOMITING THAT IS NOT CONTROLLED WITH YOUR NAUSEA MEDICATION *UNUSUAL SHORTNESS OF BREATH *UNUSUAL BRUISING OR BLEEDING *URINARY PROBLEMS (pain or burning when urinating, or frequent urination) *BOWEL PROBLEMS (unusual diarrhea, constipation, pain near the anus) TENDERNESS IN MOUTH AND THROAT WITH OR WITHOUT PRESENCE OF ULCERS (sore throat, sores in mouth, or a toothache) UNUSUAL RASH, SWELLING OR PAIN  UNUSUAL VAGINAL DISCHARGE OR ITCHING   Items with * indicate a potential emergency and should be followed up as soon as possible or go to the Emergency Department if any problems should occur.  Please show the CHEMOTHERAPY ALERT CARD or IMMUNOTHERAPY ALERT CARD at  check-in to the Emergency Department and triage nurse.  Should you have questions after your visit or need to cancel or reschedule your appointment, please contact [facility]  Dept: [phone]  and follow the prompts.  Office hours are 8:00 a.m. to 4:30 p.m. Monday - Friday. Please note that voicemails left after 4:00 p.m. may not be returned until the following business day.  We are closed weekends and major holidays. You have access to a nurse at all times for urgent questions. Please call the main number to the clinic Dept: [phone] and follow the prompts.   For any non-urgent questions, you may also contact your provider using MyChart. We now offer e-Visits for anyone 18 and older to request care online for non-urgent symptoms. For details visit [email or web address].   Also download the MyChart app! Go to the app store, search "MyChart", open the app, select , and log in with your MyChart username and password.   

## 2023-02-16 NOTE — Progress Notes (Signed)
[facility] OFFICE PROGRESS NOTE  Patient Care Team: [name], MD as PCP - General (Family Medicine)  ASSESSMENT & PLAN:  Endometrial cancer [facility]) Clinically, she is responding to treatment The size of the palpable inguinal lymph node has reduced by almost 50% She has no side effects from treatment so far apart from intermittent hypertension We will proceed with treatment as scheduled  Essential hypertension Blood pressure is high this morning but at home is typically within normal range Her blood pressure that was documented 9 days ago was normal We will continue close monitoring of her blood pressure  No orders of the defined types were placed in this encounter.   All questions were answered. The patient knows to call the clinic with any problems, questions or concerns. The total time spent in the appointment was 20 minutes encounter with patients including review of chart and various tests results, discussions about plan of care and coordination of care plan   Artis Delay, MD 02/16/2023 8:56 AM  INTERVAL HISTORY: Please see below for problem oriented charting. she returns for treatment follow-up She is doing well She has not experienced side effects from treatment I have no difficulties getting her medication supply She has noted significant reduction in the size of her inguinal lymphadenopathy  REVIEW OF SYSTEMS:   Constitutional: Denies fevers, chills or abnormal weight loss Eyes: Denies blurriness of vision Ears, nose, mouth, throat, and face: Denies mucositis or sore throat Respiratory: Denies cough, dyspnea or wheezes Cardiovascular: Denies palpitation, chest discomfort or lower extremity swelling Gastrointestinal:  Denies nausea, heartburn or change in bowel habits Skin: Denies abnormal skin rashes Lymphatics: Denies new lymphadenopathy or easy bruising Neurological:Denies numbness, tingling or new weaknesses Behavioral/Psych: Mood is stable,  no new changes  All other systems were reviewed with the patient and are negative.  I have reviewed the past medical history, past surgical history, social history and family history with the patient and they are unchanged from previous note.  ALLERGIES:  is allergic to sulfa antibiotics and sulfamethoxazole.  MEDICATIONS:  Current Outpatient Medications  Medication Sig Dispense Refill   amLODipine-valsartan (EXFORGE) 5-160 MG tablet Take 1 tablet by mouth daily after breakfast.     aspirin EC 81 MG tablet Take 81 mg by mouth daily. Swallow whole.     Azelastine HCl 0.15 % SOLN Place 1-2 sprays into both nostrils daily as needed (sinus congestion/issues.).     calcium elemental as carbonate (BARIATRIC TUMS ULTRA) 400 MG chewable tablet Chew 2 tablets by mouth daily in the afternoon.     diphenhydrAMINE (BENADRYL) 25 MG tablet Take 25 mg by mouth daily as needed (runny nose/sinus issues.).     ergocalciferol (VITAMIN D2) 1.25 MG (50000 UT) capsule Take 1 capsule (50,000 Units total) by mouth once a week. (Patient taking differently: Take 50,000 Units by mouth 2 (two) times a week. Wednesdays & Saturdays.) 25 capsule 1   fexofenadine (ALLEGRA) 180 MG tablet Take 180 mg by mouth daily as needed for allergies or rhinitis.     glipiZIDE (GLUCOTROL XL) 2.5 MG 24 hr tablet Take 2.5 mg by mouth daily with breakfast.     glucose blood (ONETOUCH VERIO) test strip Use as instructed to check blood sugars 3 times a day. 100 each 3   lenvatinib 10 mg daily dose (LENVIMA, 10 MG DAILY DOSE,) capsule Take 1 capsule (10 mg total) by mouth daily. 30 capsule 11   levothyroxine (EUTHYROX) 100 MCG tablet Take 1 tablet (100 mcg total)  by mouth daily before breakfast. 60 tablet 1   lidocaine-prilocaine (EMLA) cream Apply 1 Application topically daily as needed (prior to port access.). 30 g 3   OneTouch Delica Lancets 33G MISC 1 each by Other route 2 (two) times daily. Use to monitor glucose levels BID; E11.65 100 each  2   RELION PEN NEEDLES 32G X 4 MM MISC USE 1 PEN PER DAY 50 each 5   simvastatin (ZOCOR) 10 MG tablet Take 10 mg by mouth at bedtime.      spironolactone (ALDACTONE) 25 MG tablet Take 25 mg by mouth as needed.     No current facility-administered medications for this visit.    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 Pennie Rushing who obtained an endometrial biopsy showing poorly differentiated endometrial carcinoma and negative endocervical curettage   12/12/2017 Imaging   MAMMOGRAM FINDINGS: In the right axilla, a possible mass warrants further evaluation. In the left breast, no findings suspicious for malignancy.   Images were processed with CAD.   IMPRESSION: Further evaluation is suggested for possible mass in the right axilla.     12/24/2017 Imaging   Ct scan chest, abdomen and pelvis 1. Marked thickening of the endometrium (42 mm) compatible with known primary endometrial malignancy. No evidence of extrauterine invasion. 2. No pelvic or retroperitoneal adenopathy. 3. Right middle lobe 4 mm solid pulmonary nodule, for which follow-up chest CT is advised in 3-6 months. 4. Several findings that are equivocal for distant metastatic disease. Vaguely nodular heterogeneous hyperenhancement in the peripheral right liver lobe, which could represent benign transient perfusional phenomena, with underlying liver lesions not entirely excluded. Mildly sclerotic T12 vertebral lesion. Mildly enlarged right axillary lymph node. The best single test to  further evaluate these findings would be a PET-CT. Alternative tests include bone scan or thoracic MRI without and with IV contrast for the T12 osseous lesion, MRI abdomen without and with IV contrast for the liver findings, and diagnostic mammographic evaluation for the right axillary node. 5.  Aortic Atherosclerosis (ICD10-I70.0).     01/09/2018 PET scan   1. Moderate hypermetabolism corresponding to enlarging axillary node since 12/22/2017. Highly suspicious for an atypical distribution of metastatic disease. 2. No hypermetabolism to suggest hepatic or T12 osseous metastasis. 3. Hypermetabolic endometrial primary.   01/23/2018 Initial Diagnosis   Endometrial cancer (HCC)   01/23/2018 Pathology Results   1. Lymph node, sentinel, biopsy, left external iliac - NO CARCINOMA IDENTIFIED IN ONE LYMPH NODE (0/1) - SEE COMMENT 2. Lymph nodes, regional resection, right para aortic - NO CARCINOMA IDENTIFIED IN FOUR LYMPH NODES (0/4) - SEE COMMENT 3. Lymph nodes, regional resection, right pelvic - NO CARCINOMA IDENTIFIED IN EIGHT LYMPH NODES (0/8) - SEE COMMENT 4. Cul-de-sac biopsy - METASTATIC CARCINOMA 5. Uterus +/- tubes/ovaries, neoplastic, cervix, bilateral fallopian tubes and ovaries UTERUS: - MIXED SEROUS AND ENDOMETRIOID CARCINOMA - SEROSAL IMPLANTS PRESENT - LYMPHOVASCULAR SPACE INVASION PRESENT - LEIOMYOMATA (1.5 CM; LARGEST) - SEE ONCOLOGY TABLE AND COMMENT BELOW CERVIX: - BENIGN NABOTHIAN CYSTS - NO CARCINOMA IDENTIFIED BILATERAL OVARIES: -  METASTATIC CARCINOMA PRESENT ON OVARIAN SURFACE BILATERAL FALLOPIAN TUBES: - INTRALUMINAL CARCINOMA Microscopic Comment 1. -3. Cytokeratin AE1/3 was performed on the sentinel lymph nodes to exclude micrometastasis. There is no evidence of metastatic carcinoma by immunohistochemistry. 5. 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%) Histologic Grade: N/A Myometrial invasion: Estimated less than 50% myometrial invasion (0.3 cm of myometrium involved; 1.4 cm measured thickness) Uterine Serosa Involvement: Present Cervical stromal involvement: Not identified Extent of involvement of other organs: - Fallopian tube (left within the lumen) - Ovary, left (surface involvement) - Cul-de-sac Lymphovascular invasion: Present Regional Lymph Nodes: Examined: 1 Sentinel 12 Non-sentinel 13 Total Lymph nodes with metastasis: 0 Isolated tumor cells (< 0.2 mm): 0 Micrometastasis: (> 0.2 mm and < 2.0 mm): 0 Macrometastasis: (> 2.0 mm): 0 Extracapsular extension: N/A Tumor block for ancillary studies: 5E, 5B MMR / MSI testing: Pending will be reported separately Pathologic Stage Classification (pTNM, AJCC 8th edition): pT3a, pN0 FIGO Stage: IIIA COMMENT: There is tumor present on the surface of the left ovary and within the lumen of the left fallopian tube. The carcinoma appears to be mixed with the largest component being high grade serous carcinoma. Dr. Colonel Bald reviewed the case and agrees with the above diagnosis.   01/23/2018 Surgery   Surgeon: Quinn Axe     Operation: Robotic-assisted laparoscopic total hysterectomy with bilateral salpingoophorectomy, SLN injection, mapping and biopsy, right pelvic and para-aortic lymphadenectomy   Operative Findings:  : 10-12cm bulky uterus with frank serosal involvement on posterior cul de sac peritoneum and anterior peritoneum which adhesed the bladder to the anterior uterus. Unilateral mapping on left pelvis. No grossly suspicious nodes. Normal omentum and diaphragms.    02/01/2018 Pathology Results   Lymph node, needle/core biopsy, right axilla - METASTATIC CARCINOMA - SEE COMMENT Microscopic Comment The neoplastic cells are positive for cytokeratin 7  and Pax-8 but negative for cytokeratin 5/6, cytokeratin 20, Gata-3, p63, p53 and GCDFP. Overall, the immunoprofile is consistent with metastasis from the patient's known gynecologic carcinoma.   02/01/2018 Procedure   Ultrasound-guided core biopsies of a suspicious right axillary lymph node.   02/21/2018 - 06/13/2018 Chemotherapy   The patient had carboplatin & Taxol x 6   03/26/2018 - 04/30/2018 Radiation Therapy   Radiation treatment dates:   03/26/18, 04/02/18, 04/19/18, 04/23/18 04/30/18   Site/dose: proximal Vagina, 6 Gy in 5 fractions for a total dose of 30 Gy     04/26/2018 PET scan   1. Interval resection of the hypermetabolic endometrial primary. No discernible hypermetabolic pelvic sidewall or peritoneal metastases. 2. Stable hypermetabolic bilateral axillary lymphadenopathy. The degree of hypermetabolism in these lymph nodes remains highly suspicious for neoplasm. 3. Interval development of low level FDG uptake in 2 stable, small lymph nodes in the left groin region. This may be reactive, but close attention recommended to exclude metastatic involvement. 4. Stable non hypermetabolic low-density liver lesions and the mixed lucent and sclerotic T12 lesion is stable without hypermetabolism today.   07/09/2018 Imaging   Status post hysterectomy.   Mild axillary lymphadenopathy, improved. Nodal metastases not excluded.   Irregular wall thickening involving the anterior bladder, possibly reflecting radiation cystitis versus tumor.   Additional stable findings as above, including a 5 mm right middle lobe nodule and stable sclerosis involving the T12 vertebral body.   10/10/2018 Imaging   1. Stable exam.  No findings within the abdomen or pelvis to suggest metastatic disease. 2. Unchanged appearance of sclerosis involving the T12 vertebra. 3.  Aortic Atherosclerosis (ICD10-I70.0).   10/10/2018 Imaging   Ct abdomen and pelvis 1. Stable exam. No findings within the abdomen or pelvis to suggest  metastatic disease. 2. Unchanged appearance of sclerosis involving the T12 vertebra. 3.  Aortic Atherosclerosis (ICD10-I70.0).   01/16/2019 PET scan   1. Enlargement and increased activity in the left axillary, right axillary, and left inguinal adenopathy compatible with progressive malignancy. 2. Stable 4 mm right middle lobe subpleural nodule, not appreciably hypermetabolic. 3. Other imaging findings of potential clinical significance: Right kidney lower pole cyst. Aortic Atherosclerosis (ICD10-I70.0). Prominent stool throughout the colon favors constipation. Mild chronic left maxillary sinusitis.     02/01/2019 -  Chemotherapy   The patient had pembrolizumab and Lenvima for chemotherapy treatment.     04/25/2019 Imaging   Ct imaging 1. Interval response to therapy. Decrease in size of bilateral axillary and left inguinal lymph nodes. No new or progressive findings identified. 2. Stable sclerotic appearance of the T12 vertebra. 3. Aortic atherosclerosis. Lad coronary artery calcification noted.   08/30/2019 Imaging   1. Bulky RIGHT hilar lymph node, slightly increased in size from previous imaging. 2. Multiple foci of hyperenhancement in the liver. 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 Consider MRI liver on follow-up to establish a baseline for number of lesions as these will appear variable on CT follow-up based on phase of contrast acquisition in the future. 3. Stable 5 mm nodule adjacent to the fissure, minor fissure in the RIGHT middle lobe.     11/28/2019 Imaging   1. Stable exam. No new or progressive findings. 2. Stable enlarged right axillary lymph node. 3. Stable tiny bilateral pulmonary nodules. Continued attention on follow-up recommended. 4. Scattered hypodensities in the  liver parenchyma correspond to the hypervascular lesions seen on the previous study performed with intravenous contrast material. 5. Right renal cyst. 6. Aortic Atherosclerosis (ICD10-I70.0).     03/16/2020 Imaging   Increased echogenicity of renal parenchyma is noted bilaterally suggesting medical renal disease. No hydronephrosis or renal obstruction is noted   04/02/2020 Imaging   1. Right axillary lymphadenopathy is stable. Tiny bilateral pulmonary nodules are stable. Subcentimeter low-attenuation liver lesions are stable. 2. No new or progressive metastatic disease in the chest, abdomen or pelvis. 3. Aortic Atherosclerosis (ICD10-I70.0).   09/15/2020 Imaging   1. Stable RIGHT axillary lymph node with enlargement measuring 2 cm short axis. 2. Stable small nodule along the minor fissure in the RIGHT chest. 3. No new or progressive findings. 4. Aortic atherosclerosis.   03/24/2021 Imaging   1. Right axillary lymphadenopathy is stable compared to prior studies. 2. No other definite signs of metastatic disease elsewhere in the thorax. 3. Hepatic steatosis. Numerous hypervascular areas noted in the liver, incompletely characterized on today's arterial phase examination. These are similar to remote prior examination from 08/30/2019, likely to represent small benign lesions such as flash fill cavernous hemangiomas. These could be definitively evaluated with nonemergent abdominal MRI with and without IV gadolinium if of clinical concern. 4. Aortic atherosclerosis, in addition to left main and left anterior descending coronary artery disease. Assessment for potential risk factor modification, dietary therapy or pharmacologic therapy may be warranted, if  clinically indicated.   Aortic Atherosclerosis (ICD10-I70.0).   09/16/2021 Imaging   1. Unchanged enlarged right axillary lymph node. No evidence of new lymphadenopathy or metastatic disease in the chest, abdomen, or pelvis. 2. Subcentimeter  hyperenhancing lesions of the right lobe of the liver, hepatic segments VII and VIII are unchanged, measuring up to 0.7 cm. These are likely small benign incidental flash filling hemangiomata although incompletely characterized on this examination. Attention on follow-up. 3. Status post hysterectomy. 4. Cholelithiasis. 5. Coronary artery disease.   Aortic Atherosclerosis (ICD10-I70.0).     12/31/2021 - 03/04/2022 Chemotherapy   Patient is on Treatment Plan : UTERINE Lenvatinib (20) D1-21 + Pembrolizumab (200) D1 q21d     03/24/2022 Imaging   1. Similar to mild enlargement of an isolated right axillary nodal mass. 2. No new sites of disease identified. 3.  Possible constipation. 4. Coronary artery atherosclerosis. Aortic Atherosclerosis (ICD10-I70.0).   04/22/2022 Pathology Results   A. AXILLARY, RIGHT, LYMPH NODE:  -  1 of 3 lymph nodes positive for metastatic carcinoma, consistent with  the patient's known endometrial carcinoma (i.e. morphologically consistent with papillary serous carcinoma) with areas of geographic necrosis, negative for extracapsular extension on sections examined.    04/22/2022 Surgery   Preoperative Diagnosis: RIGHT AXILLARY LYMPHADENOPATHY    Postoperative Diagnosis: RIGHT AXILLARY LYMPHADENOPATHY    Surgical Procedure: RIGHT AXILLARY LYMPH NODE BIOPSY:     Operative Team Members:  Surgeon(s) and Role:    * Stechschulte, Hyman Hopes, MD - Primary     Specimen:  ID Type Source Tests Collected by Time Destination  1 : Right Axillary Lymph Node Tissue PATH Lymph nodes regional SURGICAL PATHOLOGY Stechschulte, Hyman Hopes, MD 04/22/2022 1040         Indications for Procedure: Krista Johnson is a 72 y.o. female who presented with right axillary lymphadenopathy. I recommended right axillary lymph node biopsy. The procedure itself as well as its risk, benefits, and alternatives were discussed the patient in full. After full discussion all questions answered the patient granted  consent to proceed.    Findings: Firm enlarged right axillary lymph node    Description of Procedure:    On the date stated above the patient was taken operating room placed in supine position.  Anesthesia was induced.  A timeout was completed verifying correct patient, procedure, positioning, and equipment needed for the case.  The patient's chest and right axilla was prepped and draped in the usual sterile fashion.   I made a incision along the hairline anterior to the axilla and dissected through the subcutaneous tissues into the axilla.  Open the axillary fascia and palpated for the lymph node.  The lymph node was dissected circumferentially.  The blood supply of the lymph node was controlled using two 2-0 Vicryl sutures.  Was passed off the field as a specimen.  The wound was irrigated and hemostasis was obtained.  The wound was closed in layers with 2-0 Vicryl suture running the deeper layers together and 4-0 Monocryl and Dermabond for the skin.  All sponge needle counts were correct at the end of the case.   07/21/2022 Imaging   CT CHEST ABDOMEN PELVIS W CONTRAST  Result Date: 07/20/2022 CLINICAL DATA:  Cervical/endometrial cancer/evaluate treatment response. Lung nodules. * Tracking Code: BO * EXAM: CT CHEST, ABDOMEN, AND PELVIS WITH CONTRAST TECHNIQUE: Multidetector CT imaging of the chest, abdomen and pelvis was performed following the standard protocol during bolus administration of intravenous contrast. RADIATION DOSE REDUCTION: This exam was performed according  to the departmental dose-optimization program which includes automated exposure control, adjustment of the mA and/or kV according to patient size and/or use of iterative reconstruction technique. CONTRAST:  80mL OMNIPAQUE IOHEXOL 300 MG/ML  SOLN COMPARISON:  03/23/2022 FINDINGS: CT CHEST FINDINGS Cardiovascular: Right Port-A-Cath tip superior caval/atrial junction. Aortic atherosclerosis. Tortuous thoracic aorta. Mild cardiomegaly,  without pericardial effusion. Lad coronary artery calcification. No central pulmonary embolism, on this non-dedicated study. Mediastinum/Nodes: No supraclavicular adenopathy. Resolution of right axillary adenopathy. No mediastinal or hilar adenopathy. Lungs/Pleura: No pleural fluid. A Perifissural right middle lobe 4 mm pulmonary nodule on 67/5 is unchanged and likely a subpleural lymph node. Left upper lobe 4-5 mm ground-glass nodule is unchanged on 39/5. Musculoskeletal: No acute osseous abnormality. 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 No hypermetabolism on prior PET. CT ABDOMEN PELVIS FINDINGS Hepatobiliary: Normal liver. Normal gallbladder, without biliary ductal dilatation. Pancreas: Normal, without mass or ductal dilatation. Spleen: Normal in size, without focal abnormality. Adrenals/Urinary Tract: Normal adrenal glands. Normal left kidney. Exophytic inter/lower pole right renal 3.7 cm cyst . In the absence of clinically indicated signs/symptoms require(s) no independent follow-up. No hydronephrosis. Normal urinary bladder. Stomach/Bowel: Gastric antral underdistention. Colonic stool burden suggests constipation. Normal appendix. Normal small bowel. Vascular/Lymphatic: Aortic atherosclerosis. Small abdominal retroperitoneal nodes are similar, not pathologic by size criteria. No abdominopelvic adenopathy. Reproductive: Hysterectomy.  No adnexal mass. Other: No significant free fluid. No free intraperitoneal air. No evidence of omental or peritoneal disease. Musculoskeletal: Osteopenia. Trace L4-5 anterolisthesis with prominent disc bulges at L3-4 through L5-S1. IMPRESSION: 1. Resolution of right axillary adenopathy. 2. No evidence of residual metastatic disease. 3.  Possible constipation. 4. Coronary artery atherosclerosis. Aortic Atherosclerosis (ICD10-I70.0). Electronically Signed   By: Jeronimo Greaves M.D.   On: 07/20/2022 16:17       01/06/2023 Imaging   CT CHEST ABDOMEN PELVIS W CONTRAST  Result Date: 01/06/2023 CLINICAL DATA:  History of metastatic uterine cancer. Follow-up. * Tracking Code: BO * EXAM: CT CHEST, ABDOMEN, AND PELVIS WITH CONTRAST TECHNIQUE: Multidetector CT imaging of the chest, abdomen and pelvis was performed following the standard protocol during bolus administration of intravenous contrast. RADIATION DOSE REDUCTION: This exam was performed according to the departmental dose-optimization program which includes automated exposure control, adjustment of the mA and/or kV according to patient size and/or use of iterative reconstruction technique. CONTRAST:  OMNIPAQUE IOHEXOL 300 MG/ML  SOLN COMPARISON:  CT July 20, 2022 FINDINGS: CT CHEST FINDINGS Cardiovascular: Right chest Port-A-Cath with tip at the superior cavoatrial junction. Normal caliber thoracic aorta. No central pulmonary embolus on this nondedicated study. Normal size heart. No significant pericardial effusion/thickening. Mediastinum/Nodes: No suspicious thyroid nodule. No pathologically enlarged mediastinal, hilar or axillary lymph nodes. Mild symmetric esophageal wall thickening. Lungs/Pleura: Stable 4 mm right middle lobe perifissural pulmonary nodule on image 69/4. No new suspicious pulmonary nodules or masses. No pleural effusion. No pneumothorax. Musculoskeletal: Similar appearance of the eccentric left T12 sclerosis present dating back to 2021 without hypermetabolism on prior PET-CT. No aggressive lytic or blastic lesion of bone. CT ABDOMEN PELVIS FINDINGS Hepatobiliary: New enhancing soft tissue nodule along the anterior capsule of the lateral left lobe of the liver measuring 12 x 7 mm on image 54/2. No suspicious intrahepatic mass. Gallbladder is unremarkable. No biliary ductal dilation. Pancreas: No pancreatic ductal dilation or evidence of acute inflammation. Spleen: No splenomegaly. Adrenals/Urinary Tract: Bilateral adrenal glands appear normal.  No hydronephrosis. Stable benign right renal cysts.  Urinary bladder is unremarkable for degree of distension. Stomach/Bowel: No radiopaque enteric contrast material was administered. Stomach is unremarkable for degree of distension. No pathologic dilation of small or large bowel. No evidence of acute bowel inflammation. Vascular/Lymphatic: Aortic atherosclerosis.  Smooth IVC contours. New pathologically enlarged left inguinal lymph node measures 2.7 cm in short axis on image 110/2. Reproductive: Uterus is surgically absent without suspicious enhancing nodularity along the vaginal cuff. No suspicious adnexal mass. Other: No significant abdominopelvic free fluid. Tiny periumbilical fat containing hernia. Musculoskeletal: No aggressive lytic or blastic lesion of bone. Diffuse demineralization of bone. Multilevel degenerative change of the spine. 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 IMPRESSION: 1. New enhancing 12 x 7 mm soft tissue nodule along the anterior capsule of the lateral left lobe of the liver no significant abdominopelvic free fluid., Suspicious for peritoneal metastatic disease. 2. New pathologically enlarged left inguinal lymph node measures 2.7 cm in short axis, suspicious for nodal metastatic disease. 3. Mild symmetric esophageal wall thickening, correlate for esophagitis. 4.  Aortic Atherosclerosis (ICD10-I70.0). These results will be called to the ordering clinician or representative by the Radiologist Assistant, and communication documented in the PACS or [employer]. Electronically Signed   By: Maudry Mayhew M.D.   On: 01/06/2023 14:55      01/20/2023 -  Chemotherapy   Patient is on Treatment Plan : UTERINE Lenvatinib (20) D1-21 + Pembrolizumab (200) D1 q21d       PHYSICAL EXAMINATION: ECOG PERFORMANCE STATUS: 0 - Asymptomatic  Vitals:   02/16/23 0851  BP: (!) 182/108  Pulse: 83  Resp: 18  SpO2: 100%   There were no vitals  filed for this visit.  GENERAL:alert, no distress and comfortable  LYMPH previously palpable lymph node on the left inguinal region has reduced in size   LABORATORY DATA:  I have reviewed the data as listed    Component Value Date/Time   NA 137 01/20/2023 1239   K 4.8 01/20/2023 1239   CL 106 01/20/2023 1239   CO2 24 01/20/2023 1239   GLUCOSE 122 (H) 01/20/2023 1239   BUN 29 (H) 01/20/2023 1239   CREATININE 1.66 (H) 01/20/2023 1239   CALCIUM 10.3 01/20/2023 1239   PROT 7.6 01/20/2023 1239   ALBUMIN 4.0 01/20/2023 1239   AST 18 01/20/2023 1239   ALT 14 01/20/2023 1239   ALKPHOS 90 01/20/2023 1239   BILITOT 0.4 01/20/2023 1239   GFRNONAA 33 (L) 01/20/2023 1239   GFRAA 34 (L) 01/10/2020 1200    No results found for: "SPEP", "UPEP"  Lab Results  Component Value Date   WBC 4.2 02/16/2023   NEUTROABS 2.4 02/16/2023   HGB 12.0 02/16/2023   HCT 35.9 (L) 02/16/2023   MCV 86.3 02/16/2023   PLT 157 02/16/2023      Chemistry      Component Value Date/Time   NA 137 01/20/2023 1239   K 4.8 01/20/2023 1239   CL 106 01/20/2023 1239   CO2 24 01/20/2023 1239   BUN 29 (H) 01/20/2023 1239   CREATININE 1.66 (H) 01/20/2023 1239      Component Value Date/Time   CALCIUM 10.3 01/20/2023 1239   ALKPHOS 90 01/20/2023 1239   AST 18 01/20/2023 1239   ALT 14 01/20/2023 1239   BILITOT 0.4 01/20/2023 1239

## 2023-02-20 ENCOUNTER — Encounter: Payer: Self-pay | Admitting: Hematology and Oncology

## 2023-02-21 ENCOUNTER — Encounter: Payer: Self-pay | Admitting: Gastroenterology

## 2023-02-27 NOTE — Telephone Encounter (Signed)
Oral Oncology Patient Advocate Encounter   Submitted application for assistance for Lenvima to [employer].   Application submitted via e-fax to [place] phone number [phone].   I will continue to check the status until final determination.   Lady Deutscher, CPhT-Adv Oncology Pharmacy Patient [place] Direct Number: [phone]  Fax: [phone]

## 2023-02-27 NOTE — Telephone Encounter (Signed)
Oral Oncology Patient Advocate Encounter  Application is now pending updated patient consent. Patient to stop by office 02/27/23 to sign.  Jinger Neighbors, CPhT-Adv Oncology Pharmacy Patient Advocate [facility] Direct Number: [phone]  Fax: [phone]

## 2023-03-03 ENCOUNTER — Ambulatory Visit: Payer: Medicare Other | Admitting: Hematology and Oncology

## 2023-03-03 ENCOUNTER — Other Ambulatory Visit: Payer: Medicare Other

## 2023-03-03 ENCOUNTER — Ambulatory Visit: Payer: Medicare Other

## 2023-03-03 NOTE — Telephone Encounter (Signed)
Oral Oncology Patient Advocate [facility] and confirmed renewal application is complete. Application will be reviewed and finalized beginning 04/20/23.  Jinger Neighbors, CPhT-Adv Oncology Pharmacy Patient Advocate [facility] Direct Number: [phone]  Fax: [phone]

## 2023-03-07 DIAGNOSIS — M25561 Pain in right knee: Secondary | ICD-10-CM | POA: Diagnosis not present

## 2023-03-07 DIAGNOSIS — G8929 Other chronic pain: Secondary | ICD-10-CM | POA: Diagnosis not present

## 2023-03-08 ENCOUNTER — Encounter: Payer: Self-pay | Admitting: Hematology and Oncology

## 2023-03-08 NOTE — Telephone Encounter (Signed)
Telephone call  

## 2023-03-10 ENCOUNTER — Encounter: Payer: Self-pay | Admitting: Hematology and Oncology

## 2023-03-10 ENCOUNTER — Ambulatory Visit (INDEPENDENT_AMBULATORY_CARE_PROVIDER_SITE_OTHER): Payer: Medicare Other | Admitting: Internal Medicine

## 2023-03-10 ENCOUNTER — Inpatient Hospital Stay: Payer: Medicare Other

## 2023-03-10 ENCOUNTER — Encounter: Payer: Self-pay | Admitting: Internal Medicine

## 2023-03-10 ENCOUNTER — Inpatient Hospital Stay: Payer: Medicare Other | Attending: Gynecology | Admitting: Hematology and Oncology

## 2023-03-10 VITALS — BP 165/100 | HR 88 | Temp 97.4°F | Resp 18 | Ht 66.0 in | Wt 185.8 lb

## 2023-03-10 VITALS — BP 134/80 | HR 59 | Ht 66.0 in | Wt 186.0 lb

## 2023-03-10 DIAGNOSIS — Z794 Long term (current) use of insulin: Secondary | ICD-10-CM | POA: Diagnosis not present

## 2023-03-10 DIAGNOSIS — N1831 Chronic kidney disease, stage 3a: Secondary | ICD-10-CM

## 2023-03-10 DIAGNOSIS — I129 Hypertensive chronic kidney disease with stage 1 through stage 4 chronic kidney disease, or unspecified chronic kidney disease: Secondary | ICD-10-CM | POA: Insufficient documentation

## 2023-03-10 DIAGNOSIS — C541 Malignant neoplasm of endometrium: Secondary | ICD-10-CM | POA: Insufficient documentation

## 2023-03-10 DIAGNOSIS — I10 Essential (primary) hypertension: Secondary | ICD-10-CM | POA: Diagnosis not present

## 2023-03-10 DIAGNOSIS — E1122 Type 2 diabetes mellitus with diabetic chronic kidney disease: Secondary | ICD-10-CM | POA: Insufficient documentation

## 2023-03-10 DIAGNOSIS — N183 Chronic kidney disease, stage 3 unspecified: Secondary | ICD-10-CM | POA: Diagnosis not present

## 2023-03-10 DIAGNOSIS — Z5112 Encounter for antineoplastic immunotherapy: Secondary | ICD-10-CM | POA: Insufficient documentation

## 2023-03-10 DIAGNOSIS — E1165 Type 2 diabetes mellitus with hyperglycemia: Secondary | ICD-10-CM | POA: Diagnosis not present

## 2023-03-10 DIAGNOSIS — Z79620 Long term (current) use of immunosuppressive biologic: Secondary | ICD-10-CM | POA: Diagnosis not present

## 2023-03-10 DIAGNOSIS — C773 Secondary and unspecified malignant neoplasm of axilla and upper limb lymph nodes: Secondary | ICD-10-CM

## 2023-03-10 DIAGNOSIS — E1142 Type 2 diabetes mellitus with diabetic polyneuropathy: Secondary | ICD-10-CM | POA: Diagnosis not present

## 2023-03-10 DIAGNOSIS — Z7189 Other specified counseling: Secondary | ICD-10-CM

## 2023-03-10 LAB — CBC WITH DIFFERENTIAL (CANCER CENTER ONLY)
Abs Immature Granulocytes: 0.12 10*3/uL — ABNORMAL HIGH (ref 0.00–0.07)
Basophils Absolute: 0 10*3/uL (ref 0.0–0.1)
Basophils Relative: 0 %
Eosinophils Absolute: 0 10*3/uL (ref 0.0–0.5)
Eosinophils Relative: 0 %
HCT: 38.5 % (ref 36.0–46.0)
Hemoglobin: 13.1 g/dL (ref 12.0–15.0)
Immature Granulocytes: 1 %
Lymphocytes Relative: 11 %
Lymphs Abs: 0.9 10*3/uL (ref 0.7–4.0)
MCH: 29.4 pg (ref 26.0–34.0)
MCHC: 34 g/dL (ref 30.0–36.0)
MCV: 86.5 fL (ref 80.0–100.0)
Monocytes Absolute: 0.3 10*3/uL (ref 0.1–1.0)
Monocytes Relative: 3 %
Neutro Abs: 7.2 10*3/uL (ref 1.7–7.7)
Neutrophils Relative %: 85 %
Platelet Count: 181 10*3/uL (ref 150–400)
RBC: 4.45 MIL/uL (ref 3.87–5.11)
RDW: 13.5 % (ref 11.5–15.5)
WBC Count: 8.5 10*3/uL (ref 4.0–10.5)
nRBC: 0 % (ref 0.0–0.2)

## 2023-03-10 LAB — CMP (CANCER CENTER ONLY)
ALT: 15 U/L (ref 0–44)
AST: 13 U/L — ABNORMAL LOW (ref 15–41)
Albumin: 4 g/dL (ref 3.5–5.0)
Alkaline Phosphatase: 77 U/L (ref 38–126)
Anion gap: 7 (ref 5–15)
BUN: 39 mg/dL — ABNORMAL HIGH (ref 8–23)
CO2: 22 mmol/L (ref 22–32)
Calcium: 9.4 mg/dL (ref 8.9–10.3)
Chloride: 108 mmol/L (ref 98–111)
Creatinine: 1.5 mg/dL — ABNORMAL HIGH (ref 0.44–1.00)
GFR, Estimated: 37 mL/min — ABNORMAL LOW (ref >60)
Glucose, Bld: 286 mg/dL — ABNORMAL HIGH (ref 70–99)
Potassium: 3.8 mmol/L (ref 3.5–5.1)
Sodium: 137 mmol/L (ref 135–145)
Total Bilirubin: 0.7 mg/dL (ref <1.2)
Total Protein: 6.8 g/dL (ref 6.5–8.1)

## 2023-03-10 LAB — POCT GLUCOSE (DEVICE FOR HOME USE): POC Glucose: 248 mg/dL — AB (ref 70–99)

## 2023-03-10 LAB — TSH: TSH: 3.138 u[IU]/mL (ref 0.350–4.500)

## 2023-03-10 MED ORDER — HEPARIN SOD (PORK) LOCK FLUSH 100 UNIT/ML IV SOLN
500.0000 [IU] | Freq: Once | INTRAVENOUS | Status: AC | PRN
Start: 2023-03-10 — End: 2023-03-10
  Administered 2023-03-10: 500 [IU]

## 2023-03-10 MED ORDER — SODIUM CHLORIDE 0.9% FLUSH
10.0000 mL | INTRAVENOUS | Status: DC | PRN
  Administered 2023-03-10: 10 mL

## 2023-03-10 MED ORDER — SODIUM CHLORIDE 0.9% FLUSH
10.0000 mL | Freq: Once | INTRAVENOUS | Status: AC
  Administered 2023-03-10: 10 mL

## 2023-03-10 MED ORDER — SODIUM CHLORIDE 0.9 % IV SOLN: Freq: Once | INTRAVENOUS | Status: AC

## 2023-03-10 MED ORDER — GLIPIZIDE 5 MG PO TABS: 5.0000 mg | ORAL_TABLET | Freq: Two times a day (BID) | ORAL | 3 refills | Status: DC

## 2023-03-10 MED ORDER — SODIUM CHLORIDE 0.9 % IV SOLN
200.0000 mg | Freq: Once | INTRAVENOUS | Status: AC
  Administered 2023-03-10: 200 mg via INTRAVENOUS
  Filled 2023-03-10: qty 200

## 2023-03-10 NOTE — Progress Notes (Signed)
Per Bertis Ruddy MD, ok to treat with SCR 1.5

## 2023-03-10 NOTE — Patient Instructions (Signed)
STOP Glipizide 5 mg XL ( Extended release)  Start Glipizide (Plain) 5 mg, 1 tablet before Breakfast and 1 tablet before Supper    HOW TO TREAT LOW BLOOD SUGARS (Blood sugar LESS THAN 70 MG/DL) Please follow the RULE OF 15 for the treatment of hypoglycemia treatment (when your (blood sugars are less than 70 mg/dL)   STEP 1: Take 15 grams of carbohydrates when your blood sugar is low, which includes:  3-4 GLUCOSE TABS  OR 3-4 OZ OF JUICE OR REGULAR SODA OR ONE TUBE OF GLUCOSE GEL    STEP 2: RECHECK blood sugar in 15 MINUTES STEP 3: If your blood sugar is still low at the 15 minute recheck --> then, go back to STEP 1 and treat AGAIN with another 15 grams of carbohydrates.

## 2023-03-10 NOTE — Assessment & Plan Note (Signed)
Clinically, she is responding to treatment The size of the palpable inguinal lymph node continues to regress She has no side effects from treatment so far apart from intermittent hypertension, hyperglycemia and chronic kidney disease We will proceed with treatment as scheduled

## 2023-03-10 NOTE — Assessment & Plan Note (Signed)
Blood pressure is high this morning but at home is typically within normal range We will continue close monitoring of her blood pressure

## 2023-03-10 NOTE — Assessment & Plan Note (Signed)
She has slight worsening elevated serum creatinine Advised the patient to increase oral fluid intake and monitor blood pressure carefully

## 2023-03-10 NOTE — Progress Notes (Signed)
Name: Krista Johnson  Age/ Sex: 72 y.o., female   MRN/ DOB: [id number], Dec 10, 1950     PCP: [name], MD   Reason for Endocrinology Evaluation: Type 2 Diabetes Mellitus  Initial Endocrine Consultative Visit: 12/26/2017    PATIENT IDENTIFIER: Krista Johnson is a 72 y.o. female with a past medical history of DM, HTN, hypothyroid, CKD, Hx endometrial cancer. The patient has followed with Endocrinology clinic since 12/26/2017 for consultative assistance with management of her diabetes.  DIABETIC HISTORY:  Ms. Trees was diagnosed with DM 2004, she was on insulin between 2019-2022 while on chemotherapy. Her hemoglobin A1c has ranged from 5.8% in 2022, peaking at 14.9% in 2019. She was following up with Dr. Everardo All, subsequently saw Dr. Lucianne Muss 09/2021  Jardiance caused constipation   SUBJECTIVE:   During the last visit (12/28/2021): Saw Dr. Lucianne Muss  Today (03/10/2023): [name] is here for follow-up on diabetes management.  She checks her blood sugars occasionally . The patient has not had hypoglycemic episodes since the last clinic visit. The patient is not symptomatic with these episodes.  Patient follows with oncology for endometrial cancer Received a steroid injection in the knee   Denies nausea or vomiting  Denies constipation or diarrhea   HOME DIABETES REGIMEN:  Glipizide 5 mg XL daily    Statin: Yes ACE-I/ARB: no  METER DOWNLOAD SUMMARY:n/a   DIABETIC COMPLICATIONS: Microvascular complications:  CKD III Denies:  Last Eye Exam: Completed   Macrovascular complications:   Denies: CAD, CVA, PVD   HISTORY:  Past Medical History:  Past Medical History:  Diagnosis Date   Arthritis    right knee--- last cortisone injection 04/ 2019   CKD (chronic kidney disease)    Diabetes mellitus without complication [facility])    endocrinologist-  dr Everardo All   Endometrial cancer [facility])    History of colon polyps    Hyperlipidemia    Hyperparathyroidism (HCC)     Hypertension    Past Surgical History:  Past Surgical History:  Procedure Laterality Date   AXILLARY LYMPH NODE BIOPSY Right 04/22/2022   Procedure: RIGHT AXILLARY LYMPH NODE BIOPSY;  Surgeon: Quentin Ore, MD;  Location: [facility] OR;  Service: General;  Laterality: Right;   COLONOSCOPY  last one 12-25-2017   IR IMAGING GUIDED PORT INSERTION  02/20/2018   ROBOTIC ASSISTED TOTAL HYSTERECTOMY WITH BILATERAL SALPINGO OOPHERECTOMY N/A 01/23/2018   Procedure: XI ROBOTIC ASSISTED TOTAL HYSTERECTOMY WITH BILATERAL SALPINGO OOPHORECTOMY;  Surgeon: Adolphus Birchwood, MD;  Location: [facility] ORS;  Service: Gynecology;  Laterality: N/A;   SENTINEL NODE BIOPSY N/A 01/23/2018   Procedure: SENTINEL NODE BIOPSY;  Surgeon: Adolphus Birchwood, MD;  Location: [facility] ORS;  Service: Gynecology;  Laterality: N/A;   Social History:  reports that she has never smoked. She has never used smokeless tobacco. She reports that she does not currently use alcohol. She reports that she does not use drugs. Family History:  Family History  Problem Relation Age of Onset   Diabetes Mother    Hypertension Mother    Diabetes Sister    Hypertension Sister    Diabetes Maternal Uncle    Colon cancer Paternal Aunt    Diabetes Paternal Aunt    Stomach cancer Neg Hx    Rectal cancer Neg Hx    Esophageal cancer Neg Hx    Colon polyps Neg Hx      HOME MEDICATIONS: Allergies as of 03/10/2023       Reactions   Sulfa Antibiotics Nausea Only  Sulfamethoxazole Nausea Only        Medication List        Accurate as of March 10, 2023  2:52 PM. If you have any questions, ask your nurse or doctor.          STOP taking these medications    glipiZIDE 2.5 MG 24 hr tablet Commonly known as: GLUCOTROL XL Replaced by: glipiZIDE 5 MG tablet Stopped by: Johnney Ou Conan Mcmanaway   glipiZIDE 5 MG 24 hr tablet Commonly known as: GLUCOTROL XL Stopped by: Johnney Ou Eduard Penkala       TAKE these medications    amLODipine-valsartan 5-160 MG  tablet Commonly known as: EXFORGE Take 1 tablet by mouth daily after breakfast.   aspirin EC 81 MG tablet Take 81 mg by mouth daily. Swallow whole.   Azelastine HCl 0.15 % Soln Place 1-2 sprays into both nostrils daily as needed (sinus congestion/issues.).   calcium elemental as carbonate 400 MG chewable tablet Commonly known as: BARIATRIC TUMS ULTRA Chew 2 tablets by mouth daily in the afternoon.   diphenhydrAMINE 25 MG tablet Commonly known as: BENADRYL Take 25 mg by mouth daily as needed (runny nose/sinus issues.).   ergocalciferol 1.25 MG (50000 UT) capsule Commonly known as: VITAMIN D2 Take 1 capsule (50,000 Units total) by mouth once a week. What changed:  when to take this additional instructions   fexofenadine 180 MG tablet Commonly known as: ALLEGRA Take 180 mg by mouth daily as needed for allergies or rhinitis.   glipiZIDE 5 MG tablet Commonly known as: GLUCOTROL Take 1 tablet (5 mg total) by mouth 2 (two) times daily before a meal. Replaces: glipiZIDE 2.5 MG 24 hr tablet Started by: Johnney Ou Cleopatra Sardo   lenvatinib 10 mg daily dose capsule Commonly known as: Lenvima (10 MG Daily Dose) Take 1 capsule (10 mg total) by mouth daily.   levothyroxine 100 MCG tablet Commonly known as: Euthyrox Take 1 tablet (100 mcg total) by mouth daily before breakfast.   lidocaine-prilocaine cream Commonly known as: EMLA Apply 1 Application topically daily as needed (prior to port access.).   OneTouch Delica Lancets 33G Misc 1 each by Other route 2 (two) times daily. Use to monitor glucose levels BID; E11.65   OneTouch Verio test strip Generic drug: glucose blood Use as instructed to check blood sugars 3 times a day.   ReliOn Pen Needles 32G X 4 MM Misc Generic drug: Insulin Pen Needle USE 1 PEN PER DAY   simvastatin 10 MG tablet Commonly known as: ZOCOR Take 10 mg by mouth at bedtime.   spironolactone 25 MG tablet Commonly known as: ALDACTONE Take 25 mg by  mouth as needed.         OBJECTIVE:   Vital Signs: BP 134/80 (BP Location: Left Arm, Patient Position: Sitting, Cuff Size: Large)   Pulse (!) 59   Ht 5\' 6"  (1.676 m)   Wt 186 lb (84.4 kg)   SpO2 97%   BMI 30.02 kg/m   Wt Readings from Last 3 Encounters:  03/10/23 186 lb (84.4 kg)  03/10/23 185 lb 12.8 oz (84.3 kg)  01/20/23 185 lb 3.2 oz (84 kg)     Exam: General: Pt appears well and is in NAD  Neck: General: Supple without adenopathy. Thyroid: Thyroid size normal.  No goiter or nodules appreciated.   Lungs: Clear with good BS bilat   Heart: RRR   Extremities: Trace  pretibial edema.   Neuro: MS is good with appropriate affect, pt is  alert and Ox3    DM foot exam: 03/10/2023    The skin of the feet is intact without sores or ulcerations. The pedal pulses are 2+ on right and 2+ on left. The sensation is decreased  to a screening 5.07, 10 gram monofilament bilaterally     DATA REVIEWED:  Lab Results  Component Value Date   HGBA1C 6.8 08/09/2022   HGBA1C 6.6 (H) 04/14/2022   HGBA1C 5.8 (A) 10/06/2021    Latest Reference Range & Units 03/10/23 08:18  Sodium 135 - 145 mmol/L 137  Potassium 3.5 - 5.1 mmol/L 3.8  Chloride 98 - 111 mmol/L 108  CO2 22 - 32 mmol/L 22  Glucose 70 - 99 mg/dL 161 (H)  BUN 8 - 23 mg/dL 39 (H)  Creatinine 0.96 - 1.00 mg/dL 0.45 (H)  Calcium 8.9 - 10.3 mg/dL 9.4  Anion gap 5 - 15  7  Alkaline Phosphatase 38 - 126 U/L 77  Albumin 3.5 - 5.0 g/dL 4.0  AST 15 - 41 U/L 13 (L)  ALT 0 - 44 U/L 15  Total Protein 6.5 - 8.1 g/dL 6.8  Total Bilirubin <4.0 mg/dL 0.7  GFR, Est Non African American >60 mL/min 37 (L)   Old records , labs and images have been reviewed.    ASSESSMENT / PLAN / RECOMMENDATIONS:   1) Type 2 Diabetes Mellitus, Poorly controlled, With CKD III complications - Most recent A1c of 8.0 %. Goal A1c < 7.0 %.    -A1c has trended up, pt admits to dietary indiscretions  -Her PCP has recently increased glipizide XL from  2.5 mg to 5 mg, in office BG 248 Mg/DL -Patient intolerant to Jardiance due to constipation, Marcelline Deist is not covered -Patient is cost conscious and would like to stay on medications that are affordable -I have recommended switching glipizide XL to plain glipizide, with the understanding that she will take 1 tablet before breakfast and 1 tablet before supper -We reviewed rule of 15 for hypoglycemia -Patient to contact us with glucose issues, caution against hypoglycemia with glipizide   MEDICATIONS: Stop glipizide 5 mg XL Start glipizide (plain) 5 mg, 1 tablet before breakfast 1 tablet before supper  EDUCATION / INSTRUCTIONS: BG monitoring instructions: Patient is instructed to check her blood sugars 1 times a day. Call [facility] if: BG persistently < 70  I reviewed the Rule of 15 for the treatment of hypoglycemia in detail with the patient. Literature supplied.    2) Diabetic complications:  Eye: Does not have known diabetic retinopathy.  Neuro/ Feet: Does  have known diabetic peripheral neuropathy .  Renal: Patient does  have known baseline CKD. She   is  on an ACEI/ARB at present.     F/U in 3 months  I spent 25 minutes preparing to see the patient by review of recent labs, imaging and procedures, obtaining and reviewing separately obtained history, communicating with the patient, ordering medications, tests or procedures, and documenting clinical information in the EHR including the differential Dx, treatment, and any further evaluation and other management   Signed electronically by: Lyndle Herrlich, MD  [facility] Endocrinology  [facility] [address] [place] [place], [place] [address] Phone: [phone] FAX: [phone]   CC: [name], MD [address] [place] [address] Phone: [phone]  Fax: [phone]  Return to Endocrinology clinic as below: Future Appointments  Date Time Provider  Department Center  03/10/2023  3:00 PM Janelis Stelzer, Konrad Dolores,  MD [facility] None  03/31/2023  8:00 AM [facility] LAB [facility] None  03/31/2023  8:40 AM Bertis Ruddy, Ni, MD [facility] None  03/31/2023  9:15 AM [facility] INFUSION [facility] None  05/09/2023  9:00 AM Freddie Breech, DPM [facility]  06/13/2023  8:30 AM Manika Hast, Konrad Dolores, MD [facility] None  08/09/2023  9:00 AM Freddie Breech, DPM [facility]

## 2023-03-10 NOTE — Patient Instructions (Signed)

## 2023-03-10 NOTE — Progress Notes (Signed)
[facility] OFFICE PROGRESS NOTE  Patient Care Team: Krista Palmer, MD as PCP - General (Family Medicine)  ASSESSMENT & PLAN:  Endometrial cancer [facility]) Clinically, she is responding to treatment The size of the palpable inguinal lymph node continues to regress She has no side effects from treatment so far apart from intermittent hypertension, hyperglycemia and chronic kidney disease We will proceed with treatment as scheduled  Type 2 diabetes mellitus with hyperglycemia (HCC) Your blood sugar is very high likely due to his recent steroid injection We discussed importance of hydration and dietary modification  Chronic kidney disease (CKD), stage III (moderate) (HCC) She has slight worsening elevated serum creatinine Advised the patient to increase oral fluid intake and monitor blood pressure carefully  Essential hypertension Blood pressure is high this morning but at home is typically within normal range We will continue close monitoring of her blood pressure  No orders of the defined types were placed in this encounter.   All questions were answered. The patient knows to call the clinic with any problems, questions or concerns. The total time spent in the appointment was 30 minutes encounter with patients including review of chart and various tests results, discussions about plan of care and coordination of care plan   Krista Delay, MD 03/10/2023 3:55 PM  INTERVAL HISTORY: Please see below for problem oriented charting. she returns for chemotherapy follow-up She is doing well The size of her inguinal lymphadenopathy is regressing Even though her blood pressure is elevated in the clinic, it was normal at home She had received recent injection of corticosteroid to her right knee and has caused high blood sugar She denies other new side effects from treatment  REVIEW OF SYSTEMS:   Constitutional: Denies fevers, chills or abnormal weight loss Eyes: Denies  blurriness of vision Ears, nose, mouth, throat, and face: Denies mucositis or sore throat Respiratory: Denies cough, dyspnea or wheezes Cardiovascular: Denies palpitation, chest discomfort or lower extremity swelling Gastrointestinal:  Denies nausea, heartburn or change in bowel habits Skin: Denies abnormal skin rashes Lymphatics: Denies new lymphadenopathy or easy bruising Neurological:Denies numbness, tingling or new weaknesses Behavioral/Psych: Mood is stable, no new changes  All other systems were reviewed with the patient and are negative.  I have reviewed the past medical history, past surgical history, social history and family history with the patient and they are unchanged from previous note.  ALLERGIES:  is allergic to sulfa antibiotics and sulfamethoxazole.  MEDICATIONS:  Current Outpatient Medications  Medication Sig Dispense Refill   amLODipine-valsartan (EXFORGE) 5-160 MG tablet Take 1 tablet by mouth daily after breakfast.     aspirin EC 81 MG tablet Take 81 mg by mouth daily. Swallow whole.     Azelastine HCl 0.15 % SOLN Place 1-2 sprays into both nostrils daily as needed (sinus congestion/issues.).     calcium elemental as carbonate (BARIATRIC TUMS ULTRA) 400 MG chewable tablet Chew 2 tablets by mouth daily in the afternoon.     diphenhydrAMINE (BENADRYL) 25 MG tablet Take 25 mg by mouth daily as needed (runny nose/sinus issues.).     ergocalciferol (VITAMIN D2) 1.25 MG (50000 UT) capsule Take 1 capsule (50,000 Units total) by mouth once a week. (Patient taking differently: Take 50,000 Units by mouth 2 (two) times a week. Wednesdays & Saturdays.) 25 capsule 1   fexofenadine (ALLEGRA) 180 MG tablet Take 180 mg by mouth daily as needed for allergies or rhinitis.     glipiZIDE (GLUCOTROL) 5 MG tablet Take 1 tablet (  5 mg total) by mouth 2 (two) times daily before a meal. 180 tablet 3   glucose blood (ONETOUCH VERIO) test strip Use as instructed to check blood sugars 3 times a  day. 100 each 3   lenvatinib 10 mg daily dose (LENVIMA, 10 MG DAILY DOSE,) capsule Take 1 capsule (10 mg total) by mouth daily. 30 capsule 11   levothyroxine (EUTHYROX) 100 MCG tablet Take 1 tablet (100 mcg total) by mouth daily before breakfast. 60 tablet 1   lidocaine-prilocaine (EMLA) cream Apply 1 Application topically daily as needed (prior to port access.). 30 g 3   OneTouch Delica Lancets 33G MISC 1 each by Other route 2 (two) times daily. Use to monitor glucose levels BID; E11.65 100 each 2   RELION PEN NEEDLES 32G X 4 MM MISC USE 1 PEN PER DAY 50 each 5   simvastatin (ZOCOR) 10 MG tablet Take 10 mg by mouth at bedtime.      spironolactone (ALDACTONE) 25 MG tablet Take 25 mg by mouth as needed.     No current facility-administered medications for this visit.    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 Pennie Johnson who obtained an endometrial biopsy showing poorly differentiated endometrial carcinoma and negative endocervical curettage   12/12/2017 Imaging   MAMMOGRAM FINDINGS: In the right axilla, a possible mass warrants further evaluation. In the left breast, no findings suspicious for malignancy.   Images were processed with CAD.   IMPRESSION: Further evaluation is suggested for possible mass in the right axilla.     12/24/2017 Imaging   Ct scan chest, abdomen and pelvis 1. Marked thickening of the endometrium (42 mm) compatible with known primary endometrial malignancy. No evidence of extrauterine invasion. 2. No pelvic or  retroperitoneal adenopathy. 3. Right middle lobe 4 mm solid pulmonary nodule, for which follow-up chest CT is advised in 3-6 months. 4. Several findings that are equivocal for distant metastatic disease. Vaguely nodular heterogeneous hyperenhancement in the peripheral right liver lobe, which could represent benign transient perfusional phenomena, with underlying liver lesions not entirely excluded. Mildly sclerotic T12 vertebral lesion. Mildly enlarged right axillary lymph node. The best single test to further evaluate these findings would be a PET-CT. Alternative tests include bone scan or thoracic MRI without and with IV contrast for the T12 osseous lesion, MRI abdomen without and with IV contrast for the liver findings, and diagnostic mammographic evaluation for the right axillary node. 5.  Aortic Atherosclerosis (ICD10-I70.0).     01/09/2018 PET scan   1. Moderate hypermetabolism corresponding to enlarging axillary node since 12/22/2017. Highly suspicious for an atypical distribution of metastatic disease. 2. No hypermetabolism to suggest hepatic or T12 osseous metastasis. 3. Hypermetabolic endometrial primary.   01/23/2018 Initial Diagnosis   Endometrial cancer (HCC)   01/23/2018 Pathology Results   1. Lymph node, sentinel, biopsy, left external iliac - NO CARCINOMA IDENTIFIED IN ONE LYMPH NODE (0/1) - SEE COMMENT 2. Lymph nodes, regional resection, right para aortic - NO CARCINOMA IDENTIFIED IN FOUR LYMPH NODES (0/4) - SEE COMMENT 3.  Lymph nodes, regional resection, right pelvic - NO CARCINOMA IDENTIFIED IN EIGHT LYMPH NODES (0/8) - SEE COMMENT 4. Cul-de-sac biopsy - METASTATIC CARCINOMA 5. Uterus +/- tubes/ovaries, neoplastic, cervix, bilateral fallopian tubes and ovaries UTERUS: - MIXED SEROUS AND ENDOMETRIOID CARCINOMA - SEROSAL IMPLANTS PRESENT - LYMPHOVASCULAR SPACE INVASION PRESENT - LEIOMYOMATA (1.5 CM; LARGEST) - SEE ONCOLOGY TABLE AND COMMENT BELOW CERVIX: - BENIGN  NABOTHIAN CYSTS - NO CARCINOMA IDENTIFIED BILATERAL OVARIES: - METASTATIC CARCINOMA PRESENT ON OVARIAN SURFACE BILATERAL FALLOPIAN TUBES: - INTRALUMINAL CARCINOMA Microscopic Comment 1. -3. Cytokeratin AE1/3 was performed on the sentinel lymph nodes to exclude micrometastasis. There is no evidence of metastatic carcinoma by immunohistochemistry. 5. 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%) Histologic Grade: N/A Myometrial invasion: Estimated less than 50% myometrial invasion (0.3 cm of myometrium involved; 1.4 cm measured thickness) Uterine Serosa Involvement: Present Cervical stromal involvement: Not identified Extent of involvement of other organs: - Fallopian tube (left within the lumen) - Ovary, left (surface involvement) - Cul-de-sac Lymphovascular invasion: Present Regional Lymph Nodes: Examined: 1 Sentinel 12 Non-sentinel 13 Total Lymph nodes with metastasis: 0 Isolated tumor cells (< 0.2 mm): 0 Micrometastasis: (> 0.2 mm and < 2.0 mm): 0 Macrometastasis: (> 2.0 mm): 0 Extracapsular extension: N/A Tumor block for ancillary studies: 5E, 5B MMR / MSI testing: Pending will be reported separately Pathologic Stage Classification (pTNM, AJCC 8th edition): pT3a, pN0 FIGO Stage: IIIA COMMENT: There is tumor present on the surface of the left ovary and within the lumen of the left fallopian tube. The carcinoma appears to be mixed with the largest component being high grade serous carcinoma. Dr. Colonel Bald reviewed the case and agrees with the above diagnosis.   01/23/2018 Surgery   Surgeon: Quinn Axe     Operation: Robotic-assisted laparoscopic total hysterectomy with bilateral salpingoophorectomy, SLN injection, mapping and biopsy, right  pelvic and para-aortic lymphadenectomy   Operative Findings:  : 10-12cm bulky uterus with frank serosal involvement on posterior cul de sac peritoneum and anterior peritoneum which adhesed the bladder to the anterior uterus. Unilateral mapping on left pelvis. No grossly suspicious nodes. Normal omentum and diaphragms.    02/01/2018 Pathology Results   Lymph node, needle/core biopsy, right axilla - METASTATIC CARCINOMA - SEE COMMENT Microscopic Comment The neoplastic cells are positive for cytokeratin 7 and Pax-8 but negative for cytokeratin 5/6, cytokeratin 20, Gata-3, p63, p53 and GCDFP. Overall, the immunoprofile is consistent with metastasis from the patient's known gynecologic carcinoma.   02/01/2018 Procedure   Ultrasound-guided core biopsies of a suspicious right axillary lymph node.   02/21/2018 - 06/13/2018 Chemotherapy   The patient had carboplatin & Taxol x 6   03/26/2018 - 04/30/2018 Radiation Therapy   Radiation treatment dates:   03/26/18, 04/02/18, 04/19/18, 04/23/18 04/30/18   Site/dose: proximal Vagina, 6 Gy in 5 fractions for a total dose of 30 Gy     04/26/2018 PET scan   1. Interval resection of the hypermetabolic endometrial primary. No discernible hypermetabolic pelvic sidewall or peritoneal metastases. 2. Stable hypermetabolic bilateral axillary lymphadenopathy. The degree of hypermetabolism in these lymph nodes remains highly suspicious for neoplasm. 3. Interval development of low level FDG uptake in 2 stable, small lymph nodes in the left groin region. This may be reactive, but close attention recommended to exclude metastatic involvement. 4. Stable non hypermetabolic low-density liver lesions and the mixed lucent  and sclerotic T12 lesion is stable without hypermetabolism today.   07/09/2018 Imaging   Status post hysterectomy.   Mild axillary lymphadenopathy, improved. Nodal metastases not excluded.   Irregular wall thickening involving the anterior bladder, possibly  reflecting radiation cystitis versus tumor.   Additional stable findings as above, including a 5 mm right middle lobe nodule and stable sclerosis involving the T12 vertebral body.   10/10/2018 Imaging   1. Stable exam. No findings within the abdomen or pelvis to suggest metastatic disease. 2. Unchanged appearance of sclerosis involving the T12 vertebra. 3.  Aortic Atherosclerosis (ICD10-I70.0).   10/10/2018 Imaging   Ct abdomen and pelvis 1. Stable exam. No findings within the abdomen or pelvis to suggest metastatic disease. 2. Unchanged appearance of sclerosis involving the T12 vertebra. 3.  Aortic Atherosclerosis (ICD10-I70.0).   01/16/2019 PET scan   1. Enlargement and increased activity in the left axillary, right axillary, and left inguinal adenopathy compatible with progressive malignancy. 2. Stable 4 mm right middle lobe subpleural nodule, not appreciably hypermetabolic. 3. Other imaging findings of potential clinical significance: Right kidney lower pole cyst. Aortic Atherosclerosis (ICD10-I70.0). Prominent stool throughout the colon favors constipation. Mild chronic left maxillary sinusitis.     02/01/2019 -  Chemotherapy   The patient had pembrolizumab and Lenvima for chemotherapy treatment.     04/25/2019 Imaging   Ct imaging 1. Interval response to therapy. Decrease in size of bilateral axillary and left inguinal lymph nodes. No new or progressive findings identified. 2. Stable sclerotic appearance of the T12 vertebra. 3. Aortic atherosclerosis. Lad coronary artery calcification noted.   08/30/2019 Imaging   1. Bulky RIGHT hilar lymph node, slightly increased in size from previous imaging. 2. Multiple foci of hyperenhancement in the liver. 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  Consider MRI liver on follow-up to establish a baseline for number of lesions as these will appear variable on CT follow-up based on phase of contrast acquisition in the future. 3. Stable 5 mm nodule adjacent to the fissure, minor fissure in the RIGHT middle lobe.     11/28/2019 Imaging   1. Stable exam. No new or progressive findings. 2. Stable enlarged right axillary lymph node. 3. Stable tiny bilateral pulmonary nodules. Continued attention on follow-up recommended. 4. Scattered hypodensities in the liver parenchyma correspond to the hypervascular lesions seen on the previous study performed with intravenous contrast material. 5. Right renal cyst. 6. Aortic Atherosclerosis (ICD10-I70.0).     03/16/2020 Imaging   Increased echogenicity of renal parenchyma is noted bilaterally suggesting medical renal disease. No hydronephrosis or renal obstruction is noted   04/02/2020 Imaging   1. Right axillary lymphadenopathy is stable. Tiny bilateral pulmonary nodules are stable. Subcentimeter low-attenuation liver lesions are stable. 2. No new or progressive metastatic disease in the chest, abdomen or pelvis. 3. Aortic Atherosclerosis (ICD10-I70.0).   09/15/2020 Imaging   1. Stable RIGHT axillary lymph node with enlargement measuring 2 cm short axis. 2. Stable small nodule along the minor fissure in the RIGHT chest. 3. No new or progressive findings. 4. Aortic atherosclerosis.   03/24/2021 Imaging   1. Right axillary lymphadenopathy is stable compared to prior studies. 2. No other definite signs of metastatic disease elsewhere in the thorax. 3. Hepatic steatosis. Numerous hypervascular areas noted in the liver, incompletely  characterized on today's arterial phase examination. These are similar to remote prior examination from 08/30/2019, likely to represent small benign lesions such as flash fill cavernous hemangiomas. These could be definitively evaluated with nonemergent abdominal MRI with and  without IV gadolinium if of clinical concern. 4. Aortic atherosclerosis, in addition to left main and left anterior descending coronary artery disease. Assessment for potential risk factor modification, dietary therapy or pharmacologic therapy may be warranted, if clinically indicated.   Aortic Atherosclerosis (ICD10-I70.0).   09/16/2021 Imaging   1. Unchanged enlarged right axillary lymph node. No evidence of new lymphadenopathy or metastatic disease in the chest, abdomen, or pelvis. 2. Subcentimeter hyperenhancing lesions of the right lobe of the liver, hepatic segments VII and VIII are unchanged, measuring up to 0.7 cm. These are likely small benign incidental flash filling hemangiomata although incompletely characterized on this examination. Attention on follow-up. 3. Status post hysterectomy. 4. Cholelithiasis. 5. Coronary artery disease.   Aortic Atherosclerosis (ICD10-I70.0).     12/31/2021 - 03/04/2022 Chemotherapy   Patient is on Treatment Plan : UTERINE Lenvatinib (20) D1-21 + Pembrolizumab (200) D1 q21d     03/24/2022 Imaging   1. Similar to mild enlargement of an isolated right axillary nodal mass. 2. No new sites of disease identified. 3.  Possible constipation. 4. Coronary artery atherosclerosis. Aortic Atherosclerosis (ICD10-I70.0).   04/22/2022 Pathology Results   A. AXILLARY, RIGHT, LYMPH NODE:  -  1 of 3 lymph nodes positive for metastatic carcinoma, consistent with  the patient's known endometrial carcinoma (i.e. morphologically consistent with papillary serous carcinoma) with areas of geographic necrosis, negative for extracapsular extension on sections examined.    04/22/2022 Surgery   Preoperative Diagnosis: RIGHT AXILLARY LYMPHADENOPATHY    Postoperative Diagnosis: RIGHT AXILLARY LYMPHADENOPATHY    Surgical Procedure: RIGHT AXILLARY LYMPH NODE BIOPSY:     Operative Team Members:  Surgeon(s) and Role:    * Stechschulte, Hyman Hopes, MD - Primary     Specimen:  ID  Type Source Tests Collected by Time Destination  1 : Right Axillary Lymph Node Tissue PATH Lymph nodes regional SURGICAL PATHOLOGY Stechschulte, Hyman Hopes, MD 04/22/2022 [id number]         Indications for Procedure: Krista Johnson is a 72 y.o. female who presented with right axillary lymphadenopathy. I recommended right axillary lymph node biopsy. The procedure itself as well as its risk, benefits, and alternatives were discussed the patient in full. After full discussion all questions answered the patient granted consent to proceed.    Findings: Firm enlarged right axillary lymph node    Description of Procedure:    On the date stated above the patient was taken operating room placed in supine position.  Anesthesia was induced.  A timeout was completed verifying correct patient, procedure, positioning, and equipment needed for the case.  The patient's chest and right axilla was prepped and draped in the usual sterile fashion.   I made a incision along the hairline anterior to the axilla and dissected through the subcutaneous tissues into the axilla.  Open the axillary fascia and palpated for the lymph node.  The lymph node was dissected circumferentially.  The blood supply of the lymph node was controlled using two 2-0 Vicryl sutures.  Was passed off the field as a specimen.  The wound was irrigated and hemostasis was obtained.  The wound was closed in layers with 2-0 Vicryl suture running the deeper layers together and 4-0 Monocryl and Dermabond for the skin.  All sponge needle counts were  correct at the end of the case.   07/21/2022 Imaging   CT CHEST ABDOMEN PELVIS W CONTRAST  Result Date: 07/20/2022 CLINICAL DATA:  Cervical/endometrial cancer/evaluate treatment response. Lung nodules. * Tracking Code: BO * EXAM: CT CHEST, ABDOMEN, AND PELVIS WITH CONTRAST TECHNIQUE: Multidetector CT imaging of the chest, abdomen and pelvis was performed following the standard protocol during bolus administration of  intravenous contrast. RADIATION DOSE REDUCTION: This exam was performed according to the departmental dose-optimization program which includes automated exposure control, adjustment of the mA and/or kV according to patient size and/or use of iterative reconstruction technique. CONTRAST:  80mL OMNIPAQUE IOHEXOL 300 MG/ML  SOLN COMPARISON:  03/23/2022 FINDINGS: CT CHEST FINDINGS Cardiovascular: Right Port-A-Cath tip superior caval/atrial junction. Aortic atherosclerosis. Tortuous thoracic aorta. Mild cardiomegaly, without pericardial effusion. Lad coronary artery calcification. No central pulmonary embolism, on this non-dedicated study. Mediastinum/Nodes: No supraclavicular adenopathy. Resolution of right axillary adenopathy. No mediastinal or hilar adenopathy. Lungs/Pleura: No pleural fluid. A Perifissural right middle lobe 4 mm pulmonary nodule on 67/5 is unchanged and likely a subpleural lymph node. Left upper lobe 4-5 mm ground-glass nodule is unchanged on 39/5. Musculoskeletal: No acute osseous abnormality. 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 No hypermetabolism on prior PET. CT ABDOMEN PELVIS FINDINGS Hepatobiliary: Normal liver. Normal gallbladder, without biliary ductal dilatation. Pancreas: Normal, without mass or ductal dilatation. Spleen: Normal in size, without focal abnormality. Adrenals/Urinary Tract: Normal adrenal glands. Normal left kidney. Exophytic inter/lower pole right renal 3.7 cm cyst . In the absence of clinically indicated signs/symptoms require(s) no independent follow-up. No hydronephrosis. Normal urinary bladder. Stomach/Bowel: Gastric antral underdistention. Colonic stool burden suggests constipation. Normal appendix. Normal small bowel. Vascular/Lymphatic: Aortic atherosclerosis. Small abdominal retroperitoneal nodes are similar, not pathologic by size criteria. No abdominopelvic adenopathy. Reproductive: Hysterectomy.   No adnexal mass. Other: No significant free fluid. No free intraperitoneal air. No evidence of omental or peritoneal disease. Musculoskeletal: Osteopenia. Trace L4-5 anterolisthesis with prominent disc bulges at L3-4 through L5-S1. IMPRESSION: 1. Resolution of right axillary adenopathy. 2. No evidence of residual metastatic disease. 3.  Possible constipation. 4. Coronary artery atherosclerosis. Aortic Atherosclerosis (ICD10-I70.0). Electronically Signed   By: Jeronimo Greaves M.D.   On: 07/20/2022 16:17      01/06/2023 Imaging   CT CHEST ABDOMEN PELVIS W CONTRAST  Result Date: 01/06/2023 CLINICAL DATA:  History of metastatic uterine cancer. Follow-up. * Tracking Code: BO * EXAM: CT CHEST, ABDOMEN, AND PELVIS WITH CONTRAST TECHNIQUE: Multidetector CT imaging of the chest, abdomen and pelvis was performed following the standard protocol during bolus administration of intravenous contrast. RADIATION DOSE REDUCTION: This exam was performed according to the departmental dose-optimization program which includes automated exposure control, adjustment of the mA and/or kV according to patient size and/or use of iterative reconstruction technique. CONTRAST:  OMNIPAQUE IOHEXOL 300 MG/ML  SOLN COMPARISON:  CT July 20, 2022 FINDINGS: CT CHEST FINDINGS Cardiovascular: Right chest Port-A-Cath with tip at the superior cavoatrial junction. Normal caliber thoracic aorta. No central pulmonary embolus on this nondedicated study. Normal size heart. No significant pericardial effusion/thickening. Mediastinum/Nodes: No suspicious thyroid nodule. No pathologically enlarged mediastinal, hilar or axillary lymph nodes. Mild symmetric esophageal wall thickening. Lungs/Pleura: Stable 4 mm right middle lobe perifissural pulmonary nodule on image 69/4. No new suspicious pulmonary nodules or masses. No pleural effusion. No pneumothorax. Musculoskeletal: Similar appearance of the eccentric left T12 sclerosis present dating back to 2021  without hypermetabolism on prior PET-CT. No  aggressive lytic or blastic lesion of bone. CT ABDOMEN PELVIS FINDINGS Hepatobiliary: New enhancing soft tissue nodule along the anterior capsule of the lateral left lobe of the liver measuring 12 x 7 mm on image 54/2. No suspicious intrahepatic mass. Gallbladder is unremarkable. No biliary ductal dilation. Pancreas: No pancreatic ductal dilation or evidence of acute inflammation. Spleen: No splenomegaly. Adrenals/Urinary Tract: Bilateral adrenal glands appear normal. No hydronephrosis. Stable benign right renal cysts. Urinary bladder is unremarkable for degree of distension. Stomach/Bowel: No radiopaque enteric contrast material was administered. Stomach is unremarkable for degree of distension. No pathologic dilation of small or large bowel. No evidence of acute bowel inflammation. Vascular/Lymphatic: Aortic atherosclerosis.  Smooth IVC contours. New pathologically enlarged left inguinal lymph node measures 2.7 cm in short axis on image 110/2. Reproductive: Uterus is surgically absent without suspicious enhancing nodularity along the vaginal cuff. No suspicious adnexal mass. Other: No significant abdominopelvic free fluid. Tiny periumbilical fat containing hernia. Musculoskeletal: No aggressive lytic or blastic lesion of bone. Diffuse demineralization of bone. Multilevel degenerative change of the spine. 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 IMPRESSION: 1. New enhancing 12 x 7 mm soft tissue nodule along the anterior capsule of the lateral left lobe of the liver no significant abdominopelvic free fluid., Suspicious for peritoneal metastatic disease. 2. New pathologically enlarged left inguinal lymph node measures 2.7 cm in short axis, suspicious for nodal metastatic disease. 3. Mild symmetric esophageal wall thickening, correlate for esophagitis. 4.  Aortic Atherosclerosis (ICD10-I70.0). These results will be called  to the ordering clinician or representative by the Radiologist Assistant, and communication documented in the PACS or [employer]. Electronically Signed   By: Maudry Mayhew M.D.   On: 01/06/2023 14:55      01/20/2023 -  Chemotherapy   Patient is on Treatment Plan : UTERINE Lenvatinib (20) D1-21 + Pembrolizumab (200) D1 q21d       PHYSICAL EXAMINATION: ECOG PERFORMANCE STATUS: 1 - Symptomatic but completely ambulatory  Vitals:   03/10/23 0845  BP: (!) 165/100  Pulse: 88  Resp: 18  Temp: (!) 97.4 F (36.3 C)  SpO2: 100%   Filed Weights   03/10/23 0845  Weight: 185 lb 12.8 oz (84.3 kg)    GENERAL:alert, no distress and comfortable  LABORATORY DATA:  I have reviewed the data as listed    Component Value Date/Time   NA 137 03/10/2023 0818   K 3.8 03/10/2023 0818   CL 108 03/10/2023 0818   CO2 22 03/10/2023 0818   GLUCOSE 286 (H) 03/10/2023 0818   BUN 39 (H) 03/10/2023 0818   CREATININE 1.50 (H) 03/10/2023 0818   CALCIUM 9.4 03/10/2023 0818   PROT 6.8 03/10/2023 0818   ALBUMIN 4.0 03/10/2023 0818   AST 13 (L) 03/10/2023 0818   ALT 15 03/10/2023 0818   ALKPHOS 77 03/10/2023 0818   BILITOT 0.7 03/10/2023 0818   GFRNONAA 37 (L) 03/10/2023 0818   GFRAA 34 (L) 01/10/2020 1200    No results found for: "SPEP", "UPEP"  Lab Results  Component Value Date   WBC 8.5 03/10/2023   NEUTROABS 7.2 03/10/2023   HGB 13.1 03/10/2023   HCT 38.5 03/10/2023   MCV 86.5 03/10/2023   PLT 181 03/10/2023      Chemistry      Component Value Date/Time   NA 137 03/10/2023 0818   K 3.8 03/10/2023 0818   CL 108 03/10/2023 0818   CO2 22 03/10/2023 0818   BUN  39 (H) 03/10/2023 0818   CREATININE 1.50 (H) 03/10/2023 0818      Component Value Date/Time   CALCIUM 9.4 03/10/2023 0818   ALKPHOS 77 03/10/2023 0818   AST 13 (L) 03/10/2023 0818   ALT 15 03/10/2023 0818   BILITOT 0.7 03/10/2023 0818

## 2023-03-10 NOTE — Assessment & Plan Note (Signed)
Your blood sugar is very high likely due to his recent steroid injection We discussed importance of hydration and dietary modification

## 2023-03-12 LAB — T4: T4, Total: 6.7 ug/dL (ref 4.5–12.0)

## 2023-03-15 ENCOUNTER — Other Ambulatory Visit: Payer: Self-pay | Admitting: Hematology and Oncology

## 2023-03-15 ENCOUNTER — Telehealth: Payer: Self-pay

## 2023-03-15 NOTE — Telephone Encounter (Signed)
Spoke with representative at [facility] pharmacy regarding approval of switching manufactures for patient's levothyroxine prescription. Dr. Bertis Ruddy is aware and approves of change.

## 2023-03-24 ENCOUNTER — Ambulatory Visit: Payer: Medicare Other

## 2023-03-24 ENCOUNTER — Other Ambulatory Visit: Payer: Medicare Other

## 2023-03-24 ENCOUNTER — Ambulatory Visit: Payer: Medicare Other | Admitting: Hematology and Oncology

## 2023-03-31 ENCOUNTER — Telehealth: Payer: Self-pay

## 2023-03-31 ENCOUNTER — Other Ambulatory Visit: Payer: Self-pay

## 2023-03-31 ENCOUNTER — Inpatient Hospital Stay: Payer: Medicare Other | Attending: Gynecology | Admitting: Hematology and Oncology

## 2023-03-31 ENCOUNTER — Encounter: Payer: Self-pay | Admitting: Hematology and Oncology

## 2023-03-31 ENCOUNTER — Other Ambulatory Visit: Payer: Medicare Other

## 2023-03-31 ENCOUNTER — Inpatient Hospital Stay: Payer: Medicare Other

## 2023-03-31 VITALS — BP 172/104 | HR 85 | Temp 97.8°F | Resp 18 | Ht 66.0 in | Wt 185.0 lb

## 2023-03-31 VITALS — BP 132/92

## 2023-03-31 DIAGNOSIS — R739 Hyperglycemia, unspecified: Secondary | ICD-10-CM | POA: Insufficient documentation

## 2023-03-31 DIAGNOSIS — Z7189 Other specified counseling: Secondary | ICD-10-CM

## 2023-03-31 DIAGNOSIS — N189 Chronic kidney disease, unspecified: Secondary | ICD-10-CM | POA: Diagnosis not present

## 2023-03-31 DIAGNOSIS — Z90710 Acquired absence of both cervix and uterus: Secondary | ICD-10-CM | POA: Diagnosis not present

## 2023-03-31 DIAGNOSIS — C541 Malignant neoplasm of endometrium: Secondary | ICD-10-CM

## 2023-03-31 DIAGNOSIS — Z5112 Encounter for antineoplastic immunotherapy: Secondary | ICD-10-CM | POA: Insufficient documentation

## 2023-03-31 DIAGNOSIS — I10 Essential (primary) hypertension: Secondary | ICD-10-CM

## 2023-03-31 DIAGNOSIS — Z90722 Acquired absence of ovaries, bilateral: Secondary | ICD-10-CM | POA: Insufficient documentation

## 2023-03-31 DIAGNOSIS — C773 Secondary and unspecified malignant neoplasm of axilla and upper limb lymph nodes: Secondary | ICD-10-CM

## 2023-03-31 DIAGNOSIS — I129 Hypertensive chronic kidney disease with stage 1 through stage 4 chronic kidney disease, or unspecified chronic kidney disease: Secondary | ICD-10-CM | POA: Diagnosis not present

## 2023-03-31 DIAGNOSIS — E039 Hypothyroidism, unspecified: Secondary | ICD-10-CM

## 2023-03-31 DIAGNOSIS — Z79620 Long term (current) use of immunosuppressive biologic: Secondary | ICD-10-CM | POA: Insufficient documentation

## 2023-03-31 LAB — CBC WITH DIFFERENTIAL (CANCER CENTER ONLY)
Abs Immature Granulocytes: 0.01 10*3/uL (ref 0.00–0.07)
Basophils Absolute: 0 10*3/uL (ref 0.0–0.1)
Basophils Relative: 0 %
Eosinophils Absolute: 0.1 10*3/uL (ref 0.0–0.5)
Eosinophils Relative: 1 %
HCT: 37.9 % (ref 36.0–46.0)
Hemoglobin: 12.6 g/dL (ref 12.0–15.0)
Immature Granulocytes: 0 %
Lymphocytes Relative: 38 %
Lymphs Abs: 1.4 10*3/uL (ref 0.7–4.0)
MCH: 29.3 pg (ref 26.0–34.0)
MCHC: 33.2 g/dL (ref 30.0–36.0)
MCV: 88.1 fL (ref 80.0–100.0)
Monocytes Absolute: 0.2 10*3/uL (ref 0.1–1.0)
Monocytes Relative: 5 %
Neutro Abs: 2.1 10*3/uL (ref 1.7–7.7)
Neutrophils Relative %: 56 %
Platelet Count: 170 10*3/uL (ref 150–400)
RBC: 4.3 MIL/uL (ref 3.87–5.11)
RDW: 14.1 % (ref 11.5–15.5)
WBC Count: 3.8 10*3/uL — ABNORMAL LOW (ref 4.0–10.5)
nRBC: 0 % (ref 0.0–0.2)

## 2023-03-31 LAB — CMP (CANCER CENTER ONLY)
ALT: 15 U/L (ref 0–44)
AST: 16 U/L (ref 15–41)
Albumin: 3.8 g/dL (ref 3.5–5.0)
Alkaline Phosphatase: 78 U/L (ref 38–126)
Anion gap: 6 (ref 5–15)
BUN: 23 mg/dL (ref 8–23)
CO2: 23 mmol/L (ref 22–32)
Calcium: 9.4 mg/dL (ref 8.9–10.3)
Chloride: 110 mmol/L (ref 98–111)
Creatinine: 1.32 mg/dL — ABNORMAL HIGH (ref 0.44–1.00)
GFR, Estimated: 43 mL/min — ABNORMAL LOW (ref >60)
Glucose, Bld: 183 mg/dL — ABNORMAL HIGH (ref 70–99)
Potassium: 3.7 mmol/L (ref 3.5–5.1)
Sodium: 139 mmol/L (ref 135–145)
Total Bilirubin: 0.6 mg/dL (ref <1.2)
Total Protein: 6.5 g/dL (ref 6.5–8.1)

## 2023-03-31 LAB — T4, FREE: Free T4: 1.13 ng/dL — ABNORMAL HIGH (ref 0.61–1.12)

## 2023-03-31 LAB — TSH: TSH: 6.56 u[IU]/mL — ABNORMAL HIGH (ref 0.350–4.500)

## 2023-03-31 MED ORDER — SODIUM CHLORIDE 0.9 % IV SOLN: Freq: Once | INTRAVENOUS | Status: AC

## 2023-03-31 MED ORDER — SODIUM CHLORIDE 0.9 % IV SOLN
200.0000 mg | Freq: Once | INTRAVENOUS | Status: AC
  Administered 2023-03-31: 200 mg via INTRAVENOUS
  Filled 2023-03-31: qty 200

## 2023-03-31 MED ORDER — HEPARIN SOD (PORK) LOCK FLUSH 100 UNIT/ML IV SOLN
500.0000 [IU] | Freq: Once | INTRAVENOUS | Status: AC | PRN
  Administered 2023-03-31: 500 [IU]

## 2023-03-31 MED ORDER — AZELASTINE HCL 0.15 % NA SOLN: 1.0000 | Freq: Every day | NASAL | 2 refills | Status: AC | PRN

## 2023-03-31 MED ORDER — SODIUM CHLORIDE 0.9% FLUSH
10.0000 mL | INTRAVENOUS | Status: DC | PRN
  Administered 2023-03-31: 10 mL

## 2023-03-31 MED ORDER — LEVOTHYROXINE SODIUM 125 MCG PO TABS
125.0000 ug | ORAL_TABLET | Freq: Every day | ORAL | 1 refills | Status: DC
Start: 2023-03-31 — End: 2023-04-26

## 2023-03-31 MED ORDER — SODIUM CHLORIDE 0.9% FLUSH
10.0000 mL | Freq: Once | INTRAVENOUS | Status: AC
  Administered 2023-03-31: 10 mL

## 2023-03-31 NOTE — Assessment & Plan Note (Signed)
Clinically, she is responding to treatment She has no side effects from treatment so far apart from intermittent hypertension, hyperglycemia and chronic kidney disease We will proceed with treatment as scheduled Plan to repeat imaging study before I see her back in January

## 2023-03-31 NOTE — Assessment & Plan Note (Signed)
 Blood pressure is high this morning but at home is typically within normal range We will continue close monitoring of her blood pressure

## 2023-03-31 NOTE — Telephone Encounter (Signed)
-----   Message from Artis Delay sent at 03/31/2023  2:18 PM EST ----- Tell her to increase synthroid to 125 mcg If she does not have it at home, please e-scribe to her local pharmacy for 30 tabs, 1 refill

## 2023-03-31 NOTE — Progress Notes (Signed)
[facility] OFFICE PROGRESS NOTE  Patient Care Team: [name], MD as PCP - General (Family Medicine)  ASSESSMENT & PLAN:  Endometrial cancer [facility]) Clinically, she is responding to treatment She has no side effects from treatment so far apart from intermittent hypertension, hyperglycemia and chronic kidney disease We will proceed with treatment as scheduled Plan to repeat imaging study before I see her back in January  Essential hypertension Blood pressure is high this morning but at home is typically within normal range We will continue close monitoring of her blood pressure  Orders Placed This Encounter  Procedures   CT ABDOMEN PELVIS W CONTRAST    Standing Status:   Future    Expected Date:   04/18/2023    Expiration Date:   03/30/2024    Scheduling Instructions:     No need oral contrast    If indicated for the ordered procedure, I authorize the administration of contrast media per Radiology protocol:   Yes    Does the patient have a contrast media/X-ray dye allergy?:   No    Preferred imaging location?:   [facility]    If indicated for the ordered procedure, I authorize the administration of oral contrast media per Radiology protocol:   Yes    All questions were answered. The patient knows to call the clinic with any problems, questions or concerns. The total time spent in the appointment was 20 minutes encounter with patients including review of chart and various tests results, discussions about plan of care and coordination of care plan   Artis Delay, MD 03/31/2023 9:50 AM  INTERVAL HISTORY: Please see below for problem oriented charting. she returns for treatment follow-up She is doing well Her blood pressure at home were normal She noted improvement in the size of her lymph nodes We discussed timing of her next imaging  REVIEW OF SYSTEMS:   Constitutional: Denies fevers, chills or abnormal weight loss Eyes: Denies blurriness of  vision Ears, nose, mouth, throat, and face: Denies mucositis or sore throat Respiratory: Denies cough, dyspnea or wheezes Cardiovascular: Denies palpitation, chest discomfort or lower extremity swelling Gastrointestinal:  Denies nausea, heartburn or change in bowel habits Skin: Denies abnormal skin rashes Lymphatics: Denies new lymphadenopathy or easy bruising Neurological:Denies numbness, tingling or new weaknesses Behavioral/Psych: Mood is stable, no new changes  All other systems were reviewed with the patient and are negative.  I have reviewed the past medical history, past surgical history, social history and family history with the patient and they are unchanged from previous note.  ALLERGIES:  is allergic to sulfa antibiotics and sulfamethoxazole.  MEDICATIONS:  Current Outpatient Medications  Medication Sig Dispense Refill   amLODipine-valsartan (EXFORGE) 5-160 MG tablet Take 1 tablet by mouth daily after breakfast.     aspirin EC 81 MG tablet Take 81 mg by mouth daily. Swallow whole.     Azelastine HCl 0.15 % SOLN Place 1-2 sprays into both nostrils daily as needed (sinus congestion/issues.). 30 mL 2   calcium elemental as carbonate (BARIATRIC TUMS ULTRA) 400 MG chewable tablet Chew 2 tablets by mouth daily in the afternoon.     diphenhydrAMINE (BENADRYL) 25 MG tablet Take 25 mg by mouth daily as needed (runny nose/sinus issues.).     ergocalciferol (VITAMIN D2) 1.25 MG (50000 UT) capsule Take 1 capsule (50,000 Units total) by mouth once a week. (Patient taking differently: Take 50,000 Units by mouth 2 (two) times a week. Wednesdays & Saturdays.) 25 capsule 1  fexofenadine (ALLEGRA) 180 MG tablet Take 180 mg by mouth daily as needed for allergies or rhinitis.     glipiZIDE (GLUCOTROL) 5 MG tablet Take 1 tablet (5 mg total) by mouth 2 (two) times daily before a meal. 180 tablet 3   glucose blood (ONETOUCH VERIO) test strip Use as instructed to check blood sugars 3 times a day. 100  each 3   lenvatinib 10 mg daily dose (LENVIMA, 10 MG DAILY DOSE,) capsule Take 1 capsule (10 mg total) by mouth daily. 30 capsule 11   levothyroxine (SYNTHROID) 100 MCG tablet TAKE 1 TABLET BY MOUTH ONCE DAILY BEFORE BREAKFAST 60 tablet 0   lidocaine-prilocaine (EMLA) cream Apply 1 Application topically daily as needed (prior to port access.). 30 g 3   OneTouch Delica Lancets 33G MISC 1 each by Other route 2 (two) times daily. Use to monitor glucose levels BID; E11.65 100 each 2   RELION PEN NEEDLES 32G X 4 MM MISC USE 1 PEN PER DAY 50 each 5   simvastatin (ZOCOR) 10 MG tablet Take 10 mg by mouth at bedtime.      spironolactone (ALDACTONE) 25 MG tablet Take 25 mg by mouth as needed.     No current facility-administered medications for this visit.   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 Pennie Rushing who obtained an endometrial biopsy showing poorly differentiated endometrial carcinoma and negative endocervical curettage   12/12/2017 Imaging   MAMMOGRAM FINDINGS: In the right axilla, a possible mass warrants further evaluation. In the left breast, no findings suspicious for malignancy.   Images were processed with CAD.    IMPRESSION: Further evaluation is suggested for possible mass in the right axilla.     12/24/2017 Imaging   Ct scan chest, abdomen and pelvis 1. Marked thickening of the endometrium (42 mm) compatible with known primary endometrial malignancy. No evidence of extrauterine invasion. 2. No pelvic or retroperitoneal adenopathy. 3. Right middle lobe 4 mm solid pulmonary nodule, for which follow-up chest CT is advised in 3-6 months. 4. Several findings that are equivocal for distant metastatic disease. Vaguely nodular heterogeneous hyperenhancement in the peripheral right liver lobe, which could represent benign transient perfusional phenomena, with underlying liver lesions not entirely excluded. Mildly sclerotic T12 vertebral lesion. Mildly enlarged right axillary lymph node. The best single test to further evaluate these findings would be a PET-CT. Alternative tests include bone scan or thoracic MRI without and with IV contrast for the T12 osseous lesion, MRI abdomen without and with IV contrast for the liver findings, and diagnostic mammographic evaluation for the right axillary node. 5.  Aortic Atherosclerosis (ICD10-I70.0).     01/09/2018 PET scan   1. Moderate hypermetabolism  corresponding to enlarging axillary node since 12/22/2017. Highly suspicious for an atypical distribution of metastatic disease. 2. No hypermetabolism to suggest hepatic or T12 osseous metastasis. 3. Hypermetabolic endometrial primary.   01/23/2018 Initial Diagnosis   Endometrial cancer (HCC)   01/23/2018 Pathology Results   1. Lymph node, sentinel, biopsy, left external iliac - NO CARCINOMA IDENTIFIED IN ONE LYMPH NODE (0/1) - SEE COMMENT 2. Lymph nodes, regional resection, right para aortic - NO CARCINOMA IDENTIFIED IN FOUR LYMPH NODES (0/4) - SEE COMMENT 3. Lymph nodes, regional resection, right pelvic - NO CARCINOMA IDENTIFIED IN EIGHT LYMPH NODES (0/8) - SEE COMMENT 4. Cul-de-sac biopsy - METASTATIC  CARCINOMA 5. Uterus +/- tubes/ovaries, neoplastic, cervix, bilateral fallopian tubes and ovaries UTERUS: - MIXED SEROUS AND ENDOMETRIOID CARCINOMA - SEROSAL IMPLANTS PRESENT - LYMPHOVASCULAR SPACE INVASION PRESENT - LEIOMYOMATA (1.5 CM; LARGEST) - SEE ONCOLOGY TABLE AND COMMENT BELOW CERVIX: - BENIGN NABOTHIAN CYSTS - NO CARCINOMA IDENTIFIED BILATERAL OVARIES: - METASTATIC CARCINOMA PRESENT ON OVARIAN SURFACE BILATERAL FALLOPIAN TUBES: - INTRALUMINAL CARCINOMA Microscopic Comment 1. -3. Cytokeratin AE1/3 was performed on the sentinel lymph nodes to exclude micrometastasis. There is no evidence of metastatic carcinoma by immunohistochemistry. 5. 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%) Histologic Grade: N/A Myometrial invasion: Estimated less than 50% myometrial invasion (0.3 cm of myometrium involved; 1.4 cm measured thickness) Uterine Serosa Involvement: Present Cervical stromal involvement: Not identified Extent of involvement of other organs: - Fallopian tube (left within the lumen) - Ovary, left (surface involvement) - Cul-de-sac Lymphovascular invasion: Present Regional Lymph Nodes: Examined: 1 Sentinel 12 Non-sentinel 13 Total Lymph nodes with metastasis: 0 Isolated tumor cells (< 0.2 mm): 0 Micrometastasis: (> 0.2 mm and < 2.0 mm): 0 Macrometastasis: (> 2.0 mm): 0 Extracapsular extension: N/A Tumor block for ancillary studies: 5E, 5B MMR / MSI testing: Pending will be reported separately Pathologic Stage Classification (pTNM, AJCC 8th edition): pT3a, pN0 FIGO Stage: IIIA COMMENT: There is tumor present on the surface of the left ovary and within the lumen of the left fallopian tube. The carcinoma appears to be mixed with the largest  component being high grade serous carcinoma. Dr. Colonel Bald reviewed the case and agrees with the above diagnosis.   01/23/2018 Surgery   Surgeon: Quinn Axe     Operation: Robotic-assisted laparoscopic total hysterectomy with bilateral salpingoophorectomy, SLN injection, mapping and biopsy, right pelvic and para-aortic lymphadenectomy   Operative Findings:  : 10-12cm bulky uterus with frank serosal involvement on posterior cul de sac peritoneum and anterior peritoneum which adhesed the bladder to the anterior uterus. Unilateral mapping on left pelvis. No grossly suspicious nodes. Normal omentum and diaphragms.    02/01/2018 Pathology Results   Lymph node, needle/core biopsy, right axilla - METASTATIC CARCINOMA - SEE COMMENT Microscopic Comment The neoplastic cells are positive for cytokeratin 7 and Pax-8 but negative for cytokeratin 5/6, cytokeratin 20, Gata-3, p63, p53 and GCDFP. Overall, the immunoprofile is consistent with metastasis from the patient's known gynecologic carcinoma.   02/01/2018 Procedure   Ultrasound-guided core biopsies of a suspicious right axillary lymph node.   02/21/2018 - 06/13/2018 Chemotherapy   The patient had carboplatin & Taxol x 6   03/26/2018 - 04/30/2018 Radiation Therapy   Radiation treatment dates:   03/26/18, 04/02/18, 04/19/18, 04/23/18 04/30/18   Site/dose: proximal Vagina, 6 Gy in 5 fractions for a total dose of  30 Gy     04/26/2018 PET scan   1. Interval resection of the hypermetabolic endometrial primary. No discernible hypermetabolic pelvic sidewall or peritoneal metastases. 2. Stable hypermetabolic bilateral axillary lymphadenopathy. The degree of hypermetabolism in these lymph nodes remains highly suspicious for neoplasm. 3. Interval development of low level FDG uptake in 2 stable, small lymph nodes in the left groin region. This may be reactive, but close attention recommended to exclude metastatic involvement. 4. Stable non hypermetabolic  low-density liver lesions and the mixed lucent and sclerotic T12 lesion is stable without hypermetabolism today.   07/09/2018 Imaging   Status post hysterectomy.   Mild axillary lymphadenopathy, improved. Nodal metastases not excluded.   Irregular wall thickening involving the anterior bladder, possibly reflecting radiation cystitis versus tumor.   Additional stable findings as above, including a 5 mm right middle lobe nodule and stable sclerosis involving the T12 vertebral body.   10/10/2018 Imaging   1. Stable exam. No findings within the abdomen or pelvis to suggest metastatic disease. 2. Unchanged appearance of sclerosis involving the T12 vertebra. 3.  Aortic Atherosclerosis (ICD10-I70.0).   10/10/2018 Imaging   Ct abdomen and pelvis 1. Stable exam. No findings within the abdomen or pelvis to suggest metastatic disease. 2. Unchanged appearance of sclerosis involving the T12 vertebra. 3.  Aortic Atherosclerosis (ICD10-I70.0).   01/16/2019 PET scan   1. Enlargement and increased activity in the left axillary, right axillary, and left inguinal adenopathy compatible with progressive malignancy. 2. Stable 4 mm right middle lobe subpleural nodule, not appreciably hypermetabolic. 3. Other imaging findings of potential clinical significance: Right kidney lower pole cyst. Aortic Atherosclerosis (ICD10-I70.0). Prominent stool throughout the colon favors constipation. Mild chronic left maxillary sinusitis.     02/01/2019 -  Chemotherapy   The patient had pembrolizumab and Lenvima for chemotherapy treatment.     04/25/2019 Imaging   Ct imaging 1. Interval response to therapy. Decrease in size of bilateral axillary and left inguinal lymph nodes. No new or progressive findings identified. 2. Stable sclerotic appearance of the T12 vertebra. 3. Aortic atherosclerosis. Lad coronary artery calcification noted.   08/30/2019 Imaging   1. Bulky RIGHT hilar lymph node, slightly increased in size from  previous imaging. 2. Multiple foci of hyperenhancement in the liver. 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 Consider MRI liver on follow-up to establish a baseline for number of lesions as these will appear variable on CT follow-up based on phase of contrast acquisition in the future. 3. Stable 5 mm nodule adjacent to the fissure, minor fissure in the RIGHT middle lobe.     11/28/2019 Imaging   1. Stable exam. No new or progressive findings. 2. Stable enlarged right axillary lymph node. 3. Stable tiny bilateral pulmonary nodules. Continued attention on follow-up recommended. 4. Scattered hypodensities in the liver parenchyma correspond to the hypervascular lesions seen on the previous study performed with intravenous contrast material. 5. Right renal cyst. 6. Aortic Atherosclerosis (ICD10-I70.0).     03/16/2020 Imaging   Increased echogenicity of renal parenchyma is noted bilaterally suggesting medical renal disease. No hydronephrosis or renal obstruction is noted   04/02/2020 Imaging   1. Right axillary lymphadenopathy is stable. Tiny bilateral pulmonary nodules are stable. Subcentimeter low-attenuation liver lesions are stable. 2. No new or progressive metastatic disease in  the chest, abdomen or pelvis. 3. Aortic Atherosclerosis (ICD10-I70.0).   09/15/2020 Imaging   1. Stable RIGHT axillary lymph node with enlargement measuring 2 cm short axis. 2. Stable small nodule along the minor fissure in the RIGHT chest. 3. No new or progressive findings. 4. Aortic atherosclerosis.   03/24/2021 Imaging   1. Right axillary lymphadenopathy is stable compared to prior studies. 2. No other definite signs of metastatic disease elsewhere in the thorax. 3. Hepatic steatosis. Numerous hypervascular areas  noted in the liver, incompletely characterized on today's arterial phase examination. These are similar to remote prior examination from 08/30/2019, likely to represent small benign lesions such as flash fill cavernous hemangiomas. These could be definitively evaluated with nonemergent abdominal MRI with and without IV gadolinium if of clinical concern. 4. Aortic atherosclerosis, in addition to left main and left anterior descending coronary artery disease. Assessment for potential risk factor modification, dietary therapy or pharmacologic therapy may be warranted, if clinically indicated.   Aortic Atherosclerosis (ICD10-I70.0).   09/16/2021 Imaging   1. Unchanged enlarged right axillary lymph node. No evidence of new lymphadenopathy or metastatic disease in the chest, abdomen, or pelvis. 2. Subcentimeter hyperenhancing lesions of the right lobe of the liver, hepatic segments VII and VIII are unchanged, measuring up to 0.7 cm. These are likely small benign incidental flash filling hemangiomata although incompletely characterized on this examination. Attention on follow-up. 3. Status post hysterectomy. 4. Cholelithiasis. 5. Coronary artery disease.   Aortic Atherosclerosis (ICD10-I70.0).     12/31/2021 - 03/04/2022 Chemotherapy   Patient is on Treatment Plan : UTERINE Lenvatinib (20) D1-21 + Pembrolizumab (200) D1 q21d     03/24/2022 Imaging   1. Similar to mild enlargement of an isolated right axillary nodal mass. 2. No new sites of disease identified. 3.  Possible constipation. 4. Coronary artery atherosclerosis. Aortic Atherosclerosis (ICD10-I70.0).   04/22/2022 Pathology Results   A. AXILLARY, RIGHT, LYMPH NODE:  -  1 of 3 lymph nodes positive for metastatic carcinoma, consistent with  the patient's known endometrial carcinoma (i.e. morphologically consistent with papillary serous carcinoma) with areas of geographic necrosis, negative for extracapsular extension on sections examined.     04/22/2022 Surgery   Preoperative Diagnosis: RIGHT AXILLARY LYMPHADENOPATHY    Postoperative Diagnosis: RIGHT AXILLARY LYMPHADENOPATHY    Surgical Procedure: RIGHT AXILLARY LYMPH NODE BIOPSY:     Operative Team Members:  Surgeon(s) and Role:    * Stechschulte, Hyman Hopes, MD - Primary     Specimen:  ID Type Source Tests Collected by Time Destination  1 : Right Axillary Lymph Node Tissue PATH Lymph nodes regional SURGICAL PATHOLOGY Stechschulte, Hyman Hopes, MD 04/22/2022 1040         Indications for Procedure: AVRIEL WUJCIK is a 72 y.o. female who presented with right axillary lymphadenopathy. I recommended right axillary lymph node biopsy. The procedure itself as well as its risk, benefits, and alternatives were discussed the patient in full. After full discussion all questions answered the patient granted consent to proceed.    Findings: Firm enlarged right axillary lymph node    Description of Procedure:    On the date stated above the patient was taken operating room placed in supine position.  Anesthesia was induced.  A timeout was completed verifying correct patient, procedure, positioning, and equipment needed for the case.  The patient's chest and right axilla was prepped and draped in the usual sterile fashion.   I made a incision along the hairline anterior to the axilla and dissected  through the subcutaneous tissues into the axilla.  Open the axillary fascia and palpated for the lymph node.  The lymph node was dissected circumferentially.  The blood supply of the lymph node was controlled using two 2-0 Vicryl sutures.  Was passed off the field as a specimen.  The wound was irrigated and hemostasis was obtained.  The wound was closed in layers with 2-0 Vicryl suture running the deeper layers together and 4-0 Monocryl and Dermabond for the skin.  All sponge needle counts were correct at the end of the case.   07/21/2022 Imaging   CT CHEST ABDOMEN PELVIS W CONTRAST  Result Date:  07/20/2022 CLINICAL DATA:  Cervical/endometrial cancer/evaluate treatment response. Lung nodules. * Tracking Code: BO * EXAM: CT CHEST, ABDOMEN, AND PELVIS WITH CONTRAST TECHNIQUE: Multidetector CT imaging of the chest, abdomen and pelvis was performed following the standard protocol during bolus administration of intravenous contrast. RADIATION DOSE REDUCTION: This exam was performed according to the departmental dose-optimization program which includes automated exposure control, adjustment of the mA and/or kV according to patient size and/or use of iterative reconstruction technique. CONTRAST:  80mL OMNIPAQUE IOHEXOL 300 MG/ML  SOLN COMPARISON:  03/23/2022 FINDINGS: CT CHEST FINDINGS Cardiovascular: Right Port-A-Cath tip superior caval/atrial junction. Aortic atherosclerosis. Tortuous thoracic aorta. Mild cardiomegaly, without pericardial effusion. Lad coronary artery calcification. No central pulmonary embolism, on this non-dedicated study. Mediastinum/Nodes: No supraclavicular adenopathy. Resolution of right axillary adenopathy. No mediastinal or hilar adenopathy. Lungs/Pleura: No pleural fluid. A Perifissural right middle lobe 4 mm pulmonary nodule on 67/5 is unchanged and likely a subpleural lymph node. Left upper lobe 4-5 mm ground-glass nodule is unchanged on 39/5. Musculoskeletal: No acute osseous abnormality. 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 No hypermetabolism on prior PET. CT ABDOMEN PELVIS FINDINGS Hepatobiliary: Normal liver. Normal gallbladder, without biliary ductal dilatation. Pancreas: Normal, without mass or ductal dilatation. Spleen: Normal in size, without focal abnormality. Adrenals/Urinary Tract: Normal adrenal glands. Normal left kidney. Exophytic inter/lower pole right renal 3.7 cm cyst . In the absence of clinically indicated signs/symptoms require(s) no independent follow-up. No hydronephrosis. Normal urinary bladder.  Stomach/Bowel: Gastric antral underdistention. Colonic stool burden suggests constipation. Normal appendix. Normal small bowel. Vascular/Lymphatic: Aortic atherosclerosis. Small abdominal retroperitoneal nodes are similar, not pathologic by size criteria. No abdominopelvic adenopathy. Reproductive: Hysterectomy.  No adnexal mass. Other: No significant free fluid. No free intraperitoneal air. No evidence of omental or peritoneal disease. Musculoskeletal: Osteopenia. Trace L4-5 anterolisthesis with prominent disc bulges at L3-4 through L5-S1. IMPRESSION: 1. Resolution of right axillary adenopathy. 2. No evidence of residual metastatic disease. 3.  Possible constipation. 4. Coronary artery atherosclerosis. Aortic Atherosclerosis (ICD10-I70.0). Electronically Signed   By: Jeronimo Greaves M.D.   On: 07/20/2022 16:17      01/06/2023 Imaging   CT CHEST ABDOMEN PELVIS W CONTRAST  Result Date: 01/06/2023 CLINICAL DATA:  History of metastatic uterine cancer. Follow-up. * Tracking Code: BO * EXAM: CT CHEST, ABDOMEN, AND PELVIS WITH CONTRAST TECHNIQUE: Multidetector CT imaging of the chest, abdomen and pelvis was performed following the standard protocol during bolus administration of intravenous contrast. RADIATION DOSE REDUCTION: This exam was performed according to the departmental dose-optimization program which includes automated exposure control, adjustment of the mA and/or kV according to patient size and/or use of iterative reconstruction technique. CONTRAST:  OMNIPAQUE IOHEXOL 300 MG/ML  SOLN COMPARISON:  CT July 20, 2022 FINDINGS: CT CHEST FINDINGS Cardiovascular: Right chest Port-A-Cath with tip at  the superior cavoatrial junction. Normal caliber thoracic aorta. No central pulmonary embolus on this nondedicated study. Normal size heart. No significant pericardial effusion/thickening. Mediastinum/Nodes: No suspicious thyroid nodule. No pathologically enlarged mediastinal, hilar or axillary lymph nodes. Mild  symmetric esophageal wall thickening. Lungs/Pleura: Stable 4 mm right middle lobe perifissural pulmonary nodule on image 69/4. No new suspicious pulmonary nodules or masses. No pleural effusion. No pneumothorax. Musculoskeletal: Similar appearance of the eccentric left T12 sclerosis present dating back to 2021 without hypermetabolism on prior PET-CT. No aggressive lytic or blastic lesion of bone. CT ABDOMEN PELVIS FINDINGS Hepatobiliary: New enhancing soft tissue nodule along the anterior capsule of the lateral left lobe of the liver measuring 12 x 7 mm on image 54/2. No suspicious intrahepatic mass. Gallbladder is unremarkable. No biliary ductal dilation. Pancreas: No pancreatic ductal dilation or evidence of acute inflammation. Spleen: No splenomegaly. Adrenals/Urinary Tract: Bilateral adrenal glands appear normal. No hydronephrosis. Stable benign right renal cysts. Urinary bladder is unremarkable for degree of distension. Stomach/Bowel: No radiopaque enteric contrast material was administered. Stomach is unremarkable for degree of distension. No pathologic dilation of small or large bowel. No evidence of acute bowel inflammation. Vascular/Lymphatic: Aortic atherosclerosis.  Smooth IVC contours. New pathologically enlarged left inguinal lymph node measures 2.7 cm in short axis on image 110/2. Reproductive: Uterus is surgically absent without suspicious enhancing nodularity along the vaginal cuff. No suspicious adnexal mass. Other: No significant abdominopelvic free fluid. Tiny periumbilical fat containing hernia. Musculoskeletal: No aggressive lytic or blastic lesion of bone. Diffuse demineralization of bone. Multilevel degenerative change of the spine. 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 IMPRESSION: 1. New enhancing 12 x 7 mm soft tissue nodule along the anterior capsule of the lateral left lobe of the liver no significant abdominopelvic free fluid.,  Suspicious for peritoneal metastatic disease. 2. New pathologically enlarged left inguinal lymph node measures 2.7 cm in short axis, suspicious for nodal metastatic disease. 3. Mild symmetric esophageal wall thickening, correlate for esophagitis. 4.  Aortic Atherosclerosis (ICD10-I70.0). These results will be called to the ordering clinician or representative by the Radiologist Assistant, and communication documented in the PACS or [employer]. Electronically Signed   By: Maudry Mayhew M.D.   On: 01/06/2023 14:55      01/20/2023 -  Chemotherapy   Patient is on Treatment Plan : UTERINE Lenvatinib (20) D1-21 + Pembrolizumab (200) D1 q21d       PHYSICAL EXAMINATION: ECOG PERFORMANCE STATUS: 0 - Asymptomatic  Vitals:   03/31/23 0828  BP: (!) 172/104  Pulse: 85  Resp: 18  Temp: 97.8 F (36.6 C)  SpO2: 100%   Filed Weights   03/31/23 0828  Weight: 185 lb (83.9 kg)    GENERAL:alert, no distress and comfortable   LABORATORY DATA:  I have reviewed the data as listed    Component Value Date/Time   NA 139 03/31/2023 0802   K 3.7 03/31/2023 0802   CL 110 03/31/2023 0802   CO2 23 03/31/2023 0802   GLUCOSE 183 (H) 03/31/2023 0802   BUN 23 03/31/2023 0802   CREATININE 1.32 (H) 03/31/2023 0802   CALCIUM 9.4 03/31/2023 0802   PROT 6.5 03/31/2023 0802   ALBUMIN 3.8 03/31/2023 0802   AST 16 03/31/2023 0802   ALT 15 03/31/2023 0802   ALKPHOS 78 03/31/2023 0802   BILITOT 0.6 03/31/2023 0802   GFRNONAA 43 (L) 03/31/2023 0802   GFRAA 34 (L) 01/10/2020 1200    No results found  for: "SPEP", "UPEP"  Lab Results  Component Value Date   WBC 3.8 (L) 03/31/2023   NEUTROABS 2.1 03/31/2023   HGB 12.6 03/31/2023   HCT 37.9 03/31/2023   MCV 88.1 03/31/2023   PLT 170 03/31/2023      Chemistry      Component Value Date/Time   NA 139 03/31/2023 0802   K 3.7 03/31/2023 0802   CL 110 03/31/2023 0802   CO2 23 03/31/2023 0802   BUN 23 03/31/2023 0802   CREATININE 1.32 (H) 03/31/2023  0802      Component Value Date/Time   CALCIUM 9.4 03/31/2023 0802   ALKPHOS 78 03/31/2023 0802   AST 16 03/31/2023 0802   ALT 15 03/31/2023 0802   BILITOT 0.6 03/31/2023 0802

## 2023-03-31 NOTE — Telephone Encounter (Signed)
Called and given below message. She verbalized understanding and will start new Rx. Sent Rx to her preferred pharmacy.

## 2023-03-31 NOTE — Patient Instructions (Signed)

## 2023-04-18 ENCOUNTER — Ambulatory Visit (HOSPITAL_COMMUNITY)
Admission: RE | Admit: 2023-04-18 | Discharge: 2023-04-18 | Disposition: A | Discharge status: Home / Self Care | Payer: Medicare Other | Source: Ambulatory Visit | Attending: Hematology and Oncology | Admitting: Hematology and Oncology

## 2023-04-18 DIAGNOSIS — K7689 Other specified diseases of liver: Secondary | ICD-10-CM | POA: Diagnosis not present

## 2023-04-18 DIAGNOSIS — C541 Malignant neoplasm of endometrium: Secondary | ICD-10-CM | POA: Diagnosis not present

## 2023-04-18 MED ORDER — IOHEXOL 300 MG/ML  SOLN
100.0000 mL | Freq: Once | INTRAMUSCULAR | Status: AC | PRN
Start: 2023-04-18 — End: 2023-04-18
  Administered 2023-04-18: 100 mL via INTRAVENOUS

## 2023-04-18 MED ORDER — HEPARIN SOD (PORK) LOCK FLUSH 100 UNIT/ML IV SOLN
500.0000 [IU] | Freq: Once | INTRAVENOUS | Status: AC
  Administered 2023-04-18: 500 [IU] via INTRAVENOUS

## 2023-04-18 MED ORDER — HEPARIN SOD (PORK) LOCK FLUSH 100 UNIT/ML IV SOLN
INTRAVENOUS | Status: AC
  Filled 2023-04-18: qty 5

## 2023-04-25 ENCOUNTER — Encounter: Payer: Self-pay | Admitting: Hematology and Oncology

## 2023-04-25 ENCOUNTER — Inpatient Hospital Stay (HOSPITAL_BASED_OUTPATIENT_CLINIC_OR_DEPARTMENT_OTHER): Payer: Medicare Other | Admitting: Hematology and Oncology

## 2023-04-25 ENCOUNTER — Inpatient Hospital Stay: Payer: Medicare Other

## 2023-04-25 ENCOUNTER — Inpatient Hospital Stay: Payer: Medicare Other | Attending: Gynecology

## 2023-04-25 VITALS — BP 147/103 | HR 82 | Temp 98.0°F | Resp 18 | Ht 66.0 in | Wt 187.2 lb

## 2023-04-25 VITALS — BP 158/87

## 2023-04-25 DIAGNOSIS — Z79620 Long term (current) use of immunosuppressive biologic: Secondary | ICD-10-CM | POA: Insufficient documentation

## 2023-04-25 DIAGNOSIS — Z5112 Encounter for antineoplastic immunotherapy: Secondary | ICD-10-CM | POA: Insufficient documentation

## 2023-04-25 DIAGNOSIS — C773 Secondary and unspecified malignant neoplasm of axilla and upper limb lymph nodes: Secondary | ICD-10-CM | POA: Diagnosis not present

## 2023-04-25 DIAGNOSIS — E039 Hypothyroidism, unspecified: Secondary | ICD-10-CM

## 2023-04-25 DIAGNOSIS — C541 Malignant neoplasm of endometrium: Secondary | ICD-10-CM

## 2023-04-25 DIAGNOSIS — C779 Secondary and unspecified malignant neoplasm of lymph node, unspecified: Secondary | ICD-10-CM | POA: Diagnosis not present

## 2023-04-25 DIAGNOSIS — Z170 Estrogen receptor positive status [ER+]: Secondary | ICD-10-CM | POA: Diagnosis not present

## 2023-04-25 DIAGNOSIS — I10 Essential (primary) hypertension: Secondary | ICD-10-CM | POA: Insufficient documentation

## 2023-04-25 DIAGNOSIS — Z7189 Other specified counseling: Secondary | ICD-10-CM

## 2023-04-25 LAB — TSH: TSH: 3.245 u[IU]/mL (ref 0.350–4.500)

## 2023-04-25 LAB — CMP (CANCER CENTER ONLY)
ALT: 16 U/L (ref 0–44)
AST: 18 U/L (ref 15–41)
Albumin: 4 g/dL (ref 3.5–5.0)
Alkaline Phosphatase: 72 U/L (ref 38–126)
Anion gap: 6 (ref 5–15)
BUN: 25 mg/dL — ABNORMAL HIGH (ref 8–23)
CO2: 22 mmol/L (ref 22–32)
Calcium: 9.4 mg/dL (ref 8.9–10.3)
Chloride: 111 mmol/L (ref 98–111)
Creatinine: 1.31 mg/dL — ABNORMAL HIGH (ref 0.44–1.00)
GFR, Estimated: 43 mL/min — ABNORMAL LOW (ref >60)
Glucose, Bld: 211 mg/dL — ABNORMAL HIGH (ref 70–99)
Potassium: 3.7 mmol/L (ref 3.5–5.1)
Sodium: 139 mmol/L (ref 135–145)
Total Bilirubin: 0.5 mg/dL (ref 0.0–1.2)
Total Protein: 6.7 g/dL (ref 6.5–8.1)

## 2023-04-25 LAB — CBC WITH DIFFERENTIAL (CANCER CENTER ONLY)
Abs Immature Granulocytes: 0.01 10*3/uL (ref 0.00–0.07)
Basophils Absolute: 0 10*3/uL (ref 0.0–0.1)
Basophils Relative: 0 %
Eosinophils Absolute: 0.1 10*3/uL (ref 0.0–0.5)
Eosinophils Relative: 1 %
HCT: 36.8 % (ref 36.0–46.0)
Hemoglobin: 12.7 g/dL (ref 12.0–15.0)
Immature Granulocytes: 0 %
Lymphocytes Relative: 31 %
Lymphs Abs: 1.4 10*3/uL (ref 0.7–4.0)
MCH: 30 pg (ref 26.0–34.0)
MCHC: 34.5 g/dL (ref 30.0–36.0)
MCV: 86.8 fL (ref 80.0–100.0)
Monocytes Absolute: 0.3 10*3/uL (ref 0.1–1.0)
Monocytes Relative: 6 %
Neutro Abs: 2.8 10*3/uL (ref 1.7–7.7)
Neutrophils Relative %: 62 %
Platelet Count: 163 10*3/uL (ref 150–400)
RBC: 4.24 MIL/uL (ref 3.87–5.11)
RDW: 14.1 % (ref 11.5–15.5)
WBC Count: 4.6 10*3/uL (ref 4.0–10.5)
nRBC: 0 % (ref 0.0–0.2)

## 2023-04-25 LAB — T4, FREE: Free T4: 1.29 ng/dL — ABNORMAL HIGH (ref 0.61–1.12)

## 2023-04-25 MED ORDER — SODIUM CHLORIDE 0.9 % IV SOLN
Freq: Once | INTRAVENOUS | Status: AC
Start: 2023-04-25 — End: 2023-04-25

## 2023-04-25 MED ORDER — SODIUM CHLORIDE 0.9% FLUSH
10.0000 mL | INTRAVENOUS | Status: DC | PRN
Start: 2023-04-25 — End: 2023-04-25
  Administered 2023-04-25: 10 mL

## 2023-04-25 MED ORDER — SODIUM CHLORIDE 0.9% FLUSH
10.0000 mL | Freq: Once | INTRAVENOUS | Status: AC
  Administered 2023-04-25: 10 mL

## 2023-04-25 MED ORDER — SODIUM CHLORIDE 0.9 % IV SOLN
200.0000 mg | Freq: Once | INTRAVENOUS | Status: AC
  Administered 2023-04-25: 200 mg via INTRAVENOUS
  Filled 2023-04-25: qty 200

## 2023-04-25 MED ORDER — HEPARIN SOD (PORK) LOCK FLUSH 100 UNIT/ML IV SOLN
500.0000 [IU] | Freq: Once | INTRAVENOUS | Status: AC | PRN
Start: 2023-04-25 — End: 2023-04-25
  Administered 2023-04-25: 500 [IU]

## 2023-04-25 NOTE — Progress Notes (Signed)
 [facility] OFFICE PROGRESS NOTE  Patient Care Team: Verena Mems, MD as PCP - General (Family Medicine)  ASSESSMENT & PLAN:  Endometrial cancer [facility]) I have reviewed multiple imaging studies with the patient She has positive response to therapy and so far she had not experienced major side effects of treatment except for occasional intermittent hypertension She will continue treatment indefinitely I plan to repeat imaging study again in April We discussed risk and benefits of changing future doses of pembrolizumab  to every 6 weeks and she is in agreement She will receive 200 mg dose today but when she returns in 3 weeks, I will change the dose of 400 mg  Metastasis to lymph nodes (HCC) This has improved in size As above, we will continue to monitor  Acquired hypothyroidism Recent TSH was elevated and the dose of her Synthroid  was adjusted TSH from today is pending I will call the patient with test results and will adjust the dose of Synthroid  as needed  Essential hypertension Blood pressure is high this morning but at home is typically within normal range We will continue close monitoring of her blood pressure  No orders of the defined types were placed in this encounter.   All questions were answered. The patient knows to call the clinic with any problems, questions or concerns. The total time spent in the appointment was 40 minutes encounter with patients including review of chart and various tests results, discussions about plan of care and coordination of care plan   Almarie Bedford, MD 04/25/2023 12:36 PM  INTERVAL HISTORY: Please see below for problem oriented charting. she returns for surveillance follow-up and review of test results She tolerated recent treatment well She has occasional fatigue Her documented blood pressure from home were within normal range She denies any changes to her palpable lymphadenopathy We discussed test results and future  follow-up  REVIEW OF SYSTEMS:   Constitutional: Denies fevers, chills or abnormal weight loss Eyes: Denies blurriness of vision Ears, nose, mouth, throat, and face: Denies mucositis or sore throat Respiratory: Denies cough, dyspnea or wheezes Cardiovascular: Denies palpitation, chest discomfort or lower extremity swelling Gastrointestinal:  Denies nausea, heartburn or change in bowel habits Skin: Denies abnormal skin rashes Lymphatics: Denies new lymphadenopathy or easy bruising Neurological:Denies numbness, tingling or new weaknesses Behavioral/Psych: Mood is stable, no new changes  All other systems were reviewed with the patient and are negative.  I have reviewed the past medical history, past surgical history, social history and family history with the patient and they are unchanged from previous note.  ALLERGIES:  is allergic to sulfa antibiotics and sulfamethoxazole.  MEDICATIONS:  Current Outpatient Medications  Medication Sig Dispense Refill   amLODipine-valsartan (EXFORGE) 5-160 MG tablet Take 1 tablet by mouth daily after breakfast.     aspirin  EC 81 MG tablet Take 81 mg by mouth daily. Swallow whole.     Azelastine  HCl 0.15 % SOLN Place 1-2 sprays into both nostrils daily as needed (sinus congestion/issues.). 30 mL 2   calcium  elemental as carbonate (BARIATRIC TUMS ULTRA) 400 MG chewable tablet Chew 2 tablets by mouth daily in the afternoon.     diphenhydrAMINE  (BENADRYL ) 25 MG tablet Take 25 mg by mouth daily as needed (runny nose/sinus issues.).     ergocalciferol  (VITAMIN D2) 1.25 MG (50000 UT) capsule Take 1 capsule (50,000 Units total) by mouth once a week. (Patient taking differently: Take 50,000 Units by mouth 2 (two) times a week. Wednesdays & Saturdays.) 25 capsule 1  fexofenadine (ALLEGRA) 180 MG tablet Take 180 mg by mouth daily as needed for allergies or rhinitis.     glipiZIDE  (GLUCOTROL ) 5 MG tablet Take 1 tablet (5 mg total) by mouth 2 (two) times daily before  a meal. 180 tablet 3   glucose blood (ONETOUCH VERIO) test strip Use as instructed to check blood sugars 3 times a day. 100 each 3   lenvatinib  10 mg daily dose (LENVIMA , 10 MG DAILY DOSE,) capsule Take 1 capsule (10 mg total) by mouth daily. 30 capsule 11   levothyroxine  (SYNTHROID ) 125 MCG tablet Take 1 tablet (125 mcg total) by mouth daily before breakfast. 30 tablet 1   lidocaine -prilocaine  (EMLA ) cream Apply 1 Application topically daily as needed (prior to port access.). 30 g 3   OneTouch Delica Lancets 33G MISC 1 each by Other route 2 (two) times daily. Use to monitor glucose levels BID; E11.65 100 each 2   RELION PEN NEEDLES 32G X 4 MM MISC USE 1 PEN PER DAY 50 each 5   simvastatin  (ZOCOR ) 10 MG tablet Take 10 mg by mouth at bedtime.      spironolactone (ALDACTONE) 25 MG tablet Take 25 mg by mouth as needed.     No current facility-administered medications for this visit.    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 Raeanne who obtained an endometrial biopsy showing poorly differentiated endometrial carcinoma and negative endocervical curettage   12/12/2017 Imaging   MAMMOGRAM FINDINGS: In the right axilla, a possible mass warrants further evaluation. In the left breast, no findings suspicious for malignancy.   Images were processed with CAD.   IMPRESSION: Further evaluation is suggested for possible mass in the right axilla.     12/24/2017 Imaging   Ct scan chest, abdomen and pelvis 1. Marked thickening of the endometrium (42  mm) compatible with known primary endometrial malignancy. No evidence of extrauterine invasion. 2. No pelvic or retroperitoneal adenopathy. 3. Right middle lobe 4 mm solid pulmonary nodule, for which follow-up chest CT is advised in 3-6 months. 4. Several findings that are equivocal for distant metastatic disease. Vaguely nodular heterogeneous hyperenhancement in the peripheral right liver lobe, which could represent benign transient perfusional phenomena, with underlying liver lesions not entirely excluded. Mildly sclerotic T12 vertebral lesion. Mildly enlarged right axillary lymph node. The best single test to further evaluate these findings would be a PET-CT. Alternative tests include bone scan or thoracic MRI without and with IV contrast for the T12 osseous lesion, MRI abdomen without and with IV contrast for the liver findings, and diagnostic mammographic evaluation for the right axillary node. 5.  Aortic Atherosclerosis (ICD10-I70.0).     01/09/2018 PET scan   1. Moderate hypermetabolism corresponding to enlarging axillary node since 12/22/2017. Highly suspicious for an atypical distribution of metastatic disease. 2. No hypermetabolism to suggest hepatic or T12 osseous metastasis. 3. Hypermetabolic endometrial primary.   01/23/2018 Initial Diagnosis   Endometrial cancer (HCC)   01/23/2018 Pathology Results   1. Lymph node, sentinel, biopsy, left external iliac - NO CARCINOMA IDENTIFIED  IN ONE LYMPH NODE (0/1) - SEE COMMENT 2. Lymph nodes, regional resection, right para aortic - NO CARCINOMA IDENTIFIED IN FOUR LYMPH NODES (0/4) - SEE COMMENT 3. Lymph nodes, regional resection, right pelvic - NO CARCINOMA IDENTIFIED IN EIGHT LYMPH NODES (0/8) - SEE COMMENT 4. Cul-de-sac biopsy - METASTATIC CARCINOMA 5. Uterus +/- tubes/ovaries, neoplastic, cervix, bilateral fallopian tubes and ovaries UTERUS: - MIXED SEROUS AND ENDOMETRIOID CARCINOMA - SEROSAL IMPLANTS PRESENT - LYMPHOVASCULAR SPACE  INVASION PRESENT - LEIOMYOMATA (1.5 CM; LARGEST) - SEE ONCOLOGY TABLE AND COMMENT BELOW CERVIX: - BENIGN NABOTHIAN CYSTS - NO CARCINOMA IDENTIFIED BILATERAL OVARIES: - METASTATIC CARCINOMA PRESENT ON OVARIAN SURFACE BILATERAL FALLOPIAN TUBES: - INTRALUMINAL CARCINOMA Microscopic Comment 1. -3. Cytokeratin AE1/3 was performed on the sentinel lymph nodes to exclude micrometastasis. There is no evidence of metastatic carcinoma by immunohistochemistry. 5. 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%) Histologic Grade: N/A Myometrial invasion: Estimated less than 50% myometrial invasion (0.3 cm of myometrium involved; 1.4 cm measured thickness) Uterine Serosa Involvement: Present Cervical stromal involvement: Not identified Extent of involvement of other organs: - Fallopian tube (left within the lumen) - Ovary, left (surface involvement) - Cul-de-sac Lymphovascular invasion: Present Regional Lymph Nodes: Examined: 1 Sentinel 12 Non-sentinel 13 Total Lymph nodes with metastasis: 0 Isolated tumor cells (< 0.2 mm): 0 Micrometastasis: (> 0.2 mm and < 2.0 mm): 0 Macrometastasis: (> 2.0 mm): 0 Extracapsular extension: N/A Tumor block for ancillary studies: 5E, 5B MMR / MSI testing: Pending will be reported separately Pathologic Stage Classification (pTNM, AJCC 8th edition): pT3a, pN0 FIGO Stage: IIIA COMMENT: There is tumor present on the surface of the left ovary and within the lumen of the left fallopian tube. The carcinoma appears to be mixed with the largest component being high grade serous carcinoma. Dr. Guinevere reviewed the case and agrees with the above diagnosis.   01/23/2018 Surgery   Surgeon: Eloy Maurilio Fitch     Operation: Robotic-assisted  laparoscopic total hysterectomy with bilateral salpingoophorectomy, SLN injection, mapping and biopsy, right pelvic and para-aortic lymphadenectomy   Operative Findings:  : 10-12cm bulky uterus with frank serosal involvement on posterior cul de sac peritoneum and anterior peritoneum which adhesed the bladder to the anterior uterus. Unilateral mapping on left pelvis. No grossly suspicious nodes. Normal omentum and diaphragms.    02/01/2018 Pathology Results   Lymph node, needle/core biopsy, right axilla - METASTATIC CARCINOMA - SEE COMMENT Microscopic Comment The neoplastic cells are positive for cytokeratin 7 and Pax-8 but negative for cytokeratin 5/6, cytokeratin 20, Gata-3, p63, p53 and GCDFP. Overall, the immunoprofile is consistent with metastasis from the patient's known gynecologic carcinoma.   02/01/2018 Procedure   Ultrasound-guided core biopsies of a suspicious right axillary lymph node.   02/21/2018 - 06/13/2018 Chemotherapy   The patient had carboplatin  & Taxol  x 6   03/26/2018 - 04/30/2018 Radiation Therapy   Radiation treatment dates:   03/26/18, 04/02/18, 04/19/18, 04/23/18 04/30/18   Site/dose: proximal Vagina, 6 Gy in 5 fractions for a total dose of 30 Gy     04/26/2018 PET scan   1. Interval resection of the hypermetabolic endometrial primary. No discernible hypermetabolic pelvic sidewall or peritoneal metastases. 2. Stable hypermetabolic bilateral axillary lymphadenopathy. The degree of hypermetabolism in these lymph nodes remains highly suspicious for neoplasm. 3. Interval development of low level FDG uptake in 2 stable, small lymph  nodes in the left groin region. This may be reactive, but close attention recommended to exclude metastatic involvement. 4. Stable non hypermetabolic low-density liver lesions and the mixed lucent and sclerotic T12 lesion is stable without hypermetabolism today.   07/09/2018 Imaging   Status post hysterectomy.   Mild axillary lymphadenopathy,  improved. Nodal metastases not excluded.   Irregular wall thickening involving the anterior bladder, possibly reflecting radiation cystitis versus tumor.   Additional stable findings as above, including a 5 mm right middle lobe nodule and stable sclerosis involving the T12 vertebral body.   10/10/2018 Imaging   1. Stable exam. No findings within the abdomen or pelvis to suggest metastatic disease. 2. Unchanged appearance of sclerosis involving the T12 vertebra. 3.  Aortic Atherosclerosis (ICD10-I70.0).   10/10/2018 Imaging   Ct abdomen and pelvis 1. Stable exam. No findings within the abdomen or pelvis to suggest metastatic disease. 2. Unchanged appearance of sclerosis involving the T12 vertebra. 3.  Aortic Atherosclerosis (ICD10-I70.0).   01/16/2019 PET scan   1. Enlargement and increased activity in the left axillary, right axillary, and left inguinal adenopathy compatible with progressive malignancy. 2. Stable 4 mm right middle lobe subpleural nodule, not appreciably hypermetabolic. 3. Other imaging findings of potential clinical significance: Right kidney lower pole cyst. Aortic Atherosclerosis (ICD10-I70.0). Prominent stool throughout the colon favors constipation. Mild chronic left maxillary sinusitis.     02/01/2019 -  Chemotherapy   The patient had pembrolizumab  and Lenvima  for chemotherapy treatment.     04/25/2019 Imaging   Ct imaging 1. Interval response to therapy. Decrease in size of bilateral axillary and left inguinal lymph nodes. No new or progressive findings identified. 2. Stable sclerotic appearance of the T12 vertebra. 3. Aortic atherosclerosis. Lad coronary artery calcification noted.   08/30/2019 Imaging   1. Bulky RIGHT hilar lymph node, slightly increased in size from previous imaging. 2. Multiple foci of hyperenhancement in the liver. The study is closer to in arterial phase on today's exam in these appear more numerous in the most recent prior, but more similar  to the study of July 09, 2018 suspect that these represent flash fill hemangiomata but they are quite numerous. Consider MRI liver on follow-up to establish a baseline for number of lesions as these will appear variable on CT follow-up based on phase of contrast acquisition in the future. 3. Stable 5 mm nodule adjacent to the fissure, minor fissure in the RIGHT middle lobe.     11/28/2019 Imaging   1. Stable exam. No new or progressive findings. 2. Stable enlarged right axillary lymph node. 3. Stable tiny bilateral pulmonary nodules. Continued attention on follow-up recommended. 4. Scattered hypodensities in the liver parenchyma correspond to the hypervascular lesions seen on the previous study performed with intravenous contrast material. 5. Right renal cyst. 6. Aortic Atherosclerosis (ICD10-I70.0).     03/16/2020 Imaging   Increased echogenicity of renal parenchyma is noted bilaterally suggesting medical renal disease. No hydronephrosis or renal obstruction is noted   04/02/2020 Imaging   1. Right axillary lymphadenopathy is stable. Tiny bilateral pulmonary nodules are stable. Subcentimeter low-attenuation liver lesions are stable. 2. No new or progressive metastatic disease in the chest, abdomen or pelvis. 3. Aortic Atherosclerosis (ICD10-I70.0).   09/15/2020 Imaging   1. Stable RIGHT axillary lymph node with enlargement measuring 2 cm short axis. 2. Stable small nodule along the minor fissure in the RIGHT chest. 3. No new or progressive findings. 4. Aortic atherosclerosis.   03/24/2021 Imaging   1. Right axillary lymphadenopathy  is stable compared to prior studies. 2. No other definite signs of metastatic disease elsewhere in the thorax. 3. Hepatic steatosis. Numerous hypervascular areas noted in the liver, incompletely characterized on today's arterial phase examination. These are similar to remote prior examination from 08/30/2019, likely to represent small benign lesions such as  flash fill cavernous hemangiomas. These could be definitively evaluated with nonemergent abdominal MRI with and without IV gadolinium if of clinical concern. 4. Aortic atherosclerosis, in addition to left main and left anterior descending coronary artery disease. Assessment for potential risk factor modification, dietary therapy or pharmacologic therapy may be warranted, if clinically indicated.   Aortic Atherosclerosis (ICD10-I70.0).   09/16/2021 Imaging   1. Unchanged enlarged right axillary lymph node. No evidence of new lymphadenopathy or metastatic disease in the chest, abdomen, or pelvis. 2. Subcentimeter hyperenhancing lesions of the right lobe of the liver, hepatic segments VII and VIII are unchanged, measuring up to 0.7 cm. These are likely small benign incidental flash filling hemangiomata although incompletely characterized on this examination. Attention on follow-up. 3. Status post hysterectomy. 4. Cholelithiasis. 5. Coronary artery disease.   Aortic Atherosclerosis (ICD10-I70.0).     12/31/2021 - 03/04/2022 Chemotherapy   Patient is on Treatment Plan : UTERINE Lenvatinib  (20) D1-21 + Pembrolizumab  (200) D1 q21d     03/24/2022 Imaging   1. Similar to mild enlargement of an isolated right axillary nodal mass. 2. No new sites of disease identified. 3.  Possible constipation. 4. Coronary artery atherosclerosis. Aortic Atherosclerosis (ICD10-I70.0).   04/22/2022 Pathology Results   A. AXILLARY, RIGHT, LYMPH NODE:  -  1 of 3 lymph nodes positive for metastatic carcinoma, consistent with  the patient's known endometrial carcinoma (i.e. morphologically consistent with papillary serous carcinoma) with areas of geographic necrosis, negative for extracapsular extension on sections examined.    04/22/2022 Surgery   Preoperative Diagnosis: RIGHT AXILLARY LYMPHADENOPATHY    Postoperative Diagnosis: RIGHT AXILLARY LYMPHADENOPATHY    Surgical Procedure: RIGHT AXILLARY LYMPH NODE BIOPSY:      Operative Team Members:  Surgeon(s) and Role:    * Stechschulte, Deward PARAS, MD - Primary     Specimen:  ID Type Source Tests Collected by Time Destination  1 : Right Axillary Lymph Node Tissue PATH Lymph nodes regional SURGICAL PATHOLOGY Stechschulte, Deward PARAS, MD 04/22/2022 1040         Indications for Procedure: FRANCIS YARDLEY is a 73 y.o. female who presented with right axillary lymphadenopathy. I recommended right axillary lymph node biopsy. The procedure itself as well as its risk, benefits, and alternatives were discussed the patient in full. After full discussion all questions answered the patient granted consent to proceed.    Findings: Firm enlarged right axillary lymph node    Description of Procedure:    On the date stated above the patient was taken operating room placed in supine position.  Anesthesia was induced.  A timeout was completed verifying correct patient, procedure, positioning, and equipment needed for the case.  The patient's chest and right axilla was prepped and draped in the usual sterile fashion.   I made a incision along the hairline anterior to the axilla and dissected through the subcutaneous tissues into the axilla.  Open the axillary fascia and palpated for the lymph node.  The lymph node was dissected circumferentially.  The blood supply of the lymph node was controlled using two 2-0 Vicryl sutures.  Was passed off the field as a specimen.  The wound was irrigated and hemostasis was obtained.  The wound was closed in layers with 2-0 Vicryl suture running the deeper layers together and 4-0 Monocryl and Dermabond for the skin.  All sponge needle counts were correct at the end of the case.   07/21/2022 Imaging   CT CHEST ABDOMEN PELVIS W CONTRAST  Result Date: 07/20/2022 CLINICAL DATA:  Cervical/endometrial cancer/evaluate treatment response. Lung nodules. * Tracking Code: BO * EXAM: CT CHEST, ABDOMEN, AND PELVIS WITH CONTRAST TECHNIQUE: Multidetector CT imaging of the  chest, abdomen and pelvis was performed following the standard protocol during bolus administration of intravenous contrast. RADIATION DOSE REDUCTION: This exam was performed according to the departmental dose-optimization program which includes automated exposure control, adjustment of the mA and/or kV according to patient size and/or use of iterative reconstruction technique. CONTRAST:  80mL OMNIPAQUE  IOHEXOL  300 MG/ML  SOLN COMPARISON:  03/23/2022 FINDINGS: CT CHEST FINDINGS Cardiovascular: Right Port-A-Cath tip superior caval/atrial junction. Aortic atherosclerosis. Tortuous thoracic aorta. Mild cardiomegaly, without pericardial effusion. Lad coronary artery calcification. No central pulmonary embolism, on this non-dedicated study. Mediastinum/Nodes: No supraclavicular adenopathy. Resolution of right axillary adenopathy. No mediastinal or hilar adenopathy. Lungs/Pleura: No pleural fluid. A Perifissural right middle lobe 4 mm pulmonary nodule on 67/5 is unchanged and likely a subpleural lymph node. Left upper lobe 4-5 mm ground-glass nodule is unchanged on 39/5. Musculoskeletal: No acute osseous abnormality. 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 No hypermetabolism on prior PET. CT ABDOMEN PELVIS FINDINGS Hepatobiliary: Normal liver. Normal gallbladder, without biliary ductal dilatation. Pancreas: Normal, without mass or ductal dilatation. Spleen: Normal in size, without focal abnormality. Adrenals/Urinary Tract: Normal adrenal glands. Normal left kidney. Exophytic inter/lower pole right renal 3.7 cm cyst . In the absence of clinically indicated signs/symptoms require(s) no independent follow-up. No hydronephrosis. Normal urinary bladder. Stomach/Bowel: Gastric antral underdistention. Colonic stool burden suggests constipation. Normal appendix. Normal small bowel. Vascular/Lymphatic: Aortic atherosclerosis. Small abdominal retroperitoneal nodes  are similar, not pathologic by size criteria. No abdominopelvic adenopathy. Reproductive: Hysterectomy.  No adnexal mass. Other: No significant free fluid. No free intraperitoneal air. No evidence of omental or peritoneal disease. Musculoskeletal: Osteopenia. Trace L4-5 anterolisthesis with prominent disc bulges at L3-4 through L5-S1. IMPRESSION: 1. Resolution of right axillary adenopathy. 2. No evidence of residual metastatic disease. 3.  Possible constipation. 4. Coronary artery atherosclerosis. Aortic Atherosclerosis (ICD10-I70.0). Electronically Signed   By: Rockey Kilts M.D.   On: 07/20/2022 16:17      01/06/2023 Imaging   CT CHEST ABDOMEN PELVIS W CONTRAST  Result Date: 01/06/2023 CLINICAL DATA:  History of metastatic uterine cancer. Follow-up. * Tracking Code: BO * EXAM: CT CHEST, ABDOMEN, AND PELVIS WITH CONTRAST TECHNIQUE: Multidetector CT imaging of the chest, abdomen and pelvis was performed following the standard protocol during bolus administration of intravenous contrast. RADIATION DOSE REDUCTION: This exam was performed according to the departmental dose-optimization program which includes automated exposure control, adjustment of the mA and/or kV according to patient size and/or use of iterative reconstruction technique. CONTRAST:  OMNIPAQUE  IOHEXOL  300 MG/ML  SOLN COMPARISON:  CT July 20, 2022 FINDINGS: CT CHEST FINDINGS Cardiovascular: Right chest Port-A-Cath with tip at the superior cavoatrial junction. Normal caliber thoracic aorta. No central pulmonary embolus on this nondedicated study. Normal size heart. No significant pericardial effusion/thickening. Mediastinum/Nodes: No suspicious thyroid  nodule. No pathologically enlarged mediastinal, hilar or axillary lymph nodes. Mild symmetric esophageal wall thickening. Lungs/Pleura: Stable 4 mm right middle lobe perifissural pulmonary nodule on image 69/4. No new suspicious pulmonary  nodules or masses. No pleural effusion. No pneumothorax.  Musculoskeletal: Similar appearance of the eccentric left T12 sclerosis present dating back to 2021 without hypermetabolism on prior PET-CT. No aggressive lytic or blastic lesion of bone. CT ABDOMEN PELVIS FINDINGS Hepatobiliary: New enhancing soft tissue nodule along the anterior capsule of the lateral left lobe of the liver measuring 12 x 7 mm on image 54/2. No suspicious intrahepatic mass. Gallbladder is unremarkable. No biliary ductal dilation. Pancreas: No pancreatic ductal dilation or evidence of acute inflammation. Spleen: No splenomegaly. Adrenals/Urinary Tract: Bilateral adrenal glands appear normal. No hydronephrosis. Stable benign right renal cysts. Urinary bladder is unremarkable for degree of distension. Stomach/Bowel: No radiopaque enteric contrast material was administered. Stomach is unremarkable for degree of distension. No pathologic dilation of small or large bowel. No evidence of acute bowel inflammation. Vascular/Lymphatic: Aortic atherosclerosis.  Smooth IVC contours. New pathologically enlarged left inguinal lymph node measures 2.7 cm in short axis on image 110/2. Reproductive: Uterus is surgically absent without suspicious enhancing nodularity along the vaginal cuff. No suspicious adnexal mass. Other: No significant abdominopelvic free fluid. Tiny periumbilical fat containing hernia. Musculoskeletal: No aggressive lytic or blastic lesion of bone. Diffuse demineralization of bone. Multilevel degenerative change of the spine. 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 IMPRESSION: 1. New enhancing 12 x 7 mm soft tissue nodule along the anterior capsule of the lateral left lobe of the liver no significant abdominopelvic free fluid., Suspicious for peritoneal metastatic disease. 2. New pathologically enlarged left inguinal lymph node measures 2.7 cm in short axis, suspicious for nodal metastatic disease. 3. Mild symmetric esophageal wall  thickening, correlate for esophagitis. 4.  Aortic Atherosclerosis (ICD10-I70.0). These results will be called to the ordering clinician or representative by the Radiologist Assistant, and communication documented in the PACS or [employer]. Electronically Signed   By: Reyes Holder M.D.   On: 01/06/2023 14:55      01/20/2023 -  Chemotherapy   Patient is on Treatment Plan : UTERINE Lenvatinib  (20) D1-21 + Pembrolizumab  (200) D1 q21d     04/18/2023 Imaging   CT ABDOMEN PELVIS W CONTRAST Result Date: 04/24/2023 CLINICAL DATA:  History of metastatic endometrial carcinoma. Treatment response. * Tracking Code: BO * EXAM: CT ABDOMEN AND PELVIS WITH CONTRAST TECHNIQUE: Multidetector CT imaging of the abdomen and pelvis was performed using the standard protocol following bolus administration of intravenous contrast. RADIATION DOSE REDUCTION: This exam was performed according to the departmental dose-optimization program which includes automated exposure control, adjustment of the mA and/or kV according to patient size and/or use of iterative reconstruction technique. CONTRAST:  OMNIPAQUE  IOHEXOL  300 MG/ML  SOLN COMPARISON:  CT abdomen and pelvis dated 01/06/2023 and multiple priors FINDINGS: Lower chest: No focal consolidation or pulmonary nodule in the lung bases. No pleural effusion or pneumothorax demonstrated. Partially imaged heart size is normal. Hepatobiliary: Ill-defined peripheral hyperattenuating areas within segment 8 (2:15, 17), similar compared to 08/30/2019, likely perfusional variation/flash filling hemangiomas. Unchanged coarse calcification along anterior segment 4/8 (2:14). No intra or extrahepatic biliary ductal dilation. Normal gallbladder. Pancreas: No focal lesions or main ductal dilation. Spleen: Multifocal subcentimeter hypoattenuating foci within the spleen, similar dating back multiple studies. Adrenals/Urinary Tract: No adrenal nodules. No suspicious renal mass, calculi or  hydronephrosis. No focal bladder wall thickening. Stomach/Bowel: Normal appearance of the stomach. No evidence of bowel wall thickening, distention, or inflammatory changes. Appendix is not discretely seen. Vascular/Lymphatic: Aortic atherosclerosis. Decreased size of 1.9 cm left inguinal  lymph node (2:70), previously 2.7 cm. Reproductive: Status post hysterectomy.  No adnexal masses. Other: No free fluid, fluid collection, or free air. 1.4 x 0.5 cm soft tissue focus along the anterior margin of segment 2 (2:14), unchanged dating back to at least 10/10/2018 and not FDG-avid on prior PET study. Musculoskeletal: No acute or abnormal lytic or blastic osseous lesions. Multilevel degenerative changes of the partially imaged thoracic and lumbar spine. Unchanged grade 1 anterolisthesis at L4-5. IMPRESSION: 1. Decreased size of 1.9 cm left inguinal lymph node, previously 2.7 cm. 2. No new or progressive disease in the abdomen or pelvis. Previously noted soft tissue nodule along the anterolateral left hepatic lobe is unchanged dating back to at least 10/10/2018 in retrospect and not FDG-avid on prior PET study. 3.  Aortic Atherosclerosis (ICD10-I70.0). Electronically Signed   By: Limin  Xu M.D.   On: 04/24/2023 20:02        PHYSICAL EXAMINATION: ECOG PERFORMANCE STATUS: 1 - Symptomatic but completely ambulatory  Vitals:   04/25/23 1215  BP: (!) 147/103  Pulse: 82  Resp: 18  Temp: 98 F (36.7 C)  SpO2: 98%   Filed Weights   04/25/23 1215  Weight: 187 lb 3.2 oz (84.9 kg)    GENERAL:alert, no distress and comfortable  LABORATORY DATA:  I have reviewed the data as listed    Component Value Date/Time   NA 139 03/31/2023 0802   K 3.7 03/31/2023 0802   CL 110 03/31/2023 0802   CO2 23 03/31/2023 0802   GLUCOSE 183 (H) 03/31/2023 0802   BUN 23 03/31/2023 0802   CREATININE 1.32 (H) 03/31/2023 0802   CALCIUM  9.4 03/31/2023 0802   PROT 6.5 03/31/2023 0802   ALBUMIN 3.8 03/31/2023 0802   AST 16  03/31/2023 0802   ALT 15 03/31/2023 0802   ALKPHOS 78 03/31/2023 0802   BILITOT 0.6 03/31/2023 0802   GFRNONAA 43 (L) 03/31/2023 0802   GFRAA 34 (L) 01/10/2020 1200    No results found for: SPEP, UPEP  Lab Results  Component Value Date   WBC 4.6 04/25/2023   NEUTROABS 2.8 04/25/2023   HGB 12.7 04/25/2023   HCT 36.8 04/25/2023   MCV 86.8 04/25/2023   PLT 163 04/25/2023      Chemistry      Component Value Date/Time   NA 139 03/31/2023 0802   K 3.7 03/31/2023 0802   CL 110 03/31/2023 0802   CO2 23 03/31/2023 0802   BUN 23 03/31/2023 0802   CREATININE 1.32 (H) 03/31/2023 0802      Component Value Date/Time   CALCIUM  9.4 03/31/2023 0802   ALKPHOS 78 03/31/2023 0802   AST 16 03/31/2023 0802   ALT 15 03/31/2023 0802   BILITOT 0.6 03/31/2023 0802       RADIOGRAPHIC STUDIES: I have reviewed imaging study with the patient I have personally reviewed the radiological images as listed and agreed with the findings in the report. CT ABDOMEN PELVIS W CONTRAST Result Date: 04/24/2023 CLINICAL DATA:  History of metastatic endometrial carcinoma. Treatment response. * Tracking Code: BO * EXAM: CT ABDOMEN AND PELVIS WITH CONTRAST TECHNIQUE: Multidetector CT imaging of the abdomen and pelvis was performed using the standard protocol following bolus administration of intravenous contrast. RADIATION DOSE REDUCTION: This exam was performed according to the departmental dose-optimization program which includes automated exposure control, adjustment of the mA and/or kV according to patient size and/or use of iterative reconstruction technique. CONTRAST:  OMNIPAQUE  IOHEXOL  300 MG/ML  SOLN COMPARISON:  CT abdomen and pelvis dated 01/06/2023 and multiple priors FINDINGS: Lower chest: No focal consolidation or pulmonary nodule in the lung bases. No pleural effusion or pneumothorax demonstrated. Partially imaged heart size is normal. Hepatobiliary: Ill-defined peripheral hyperattenuating areas  within segment 8 (2:15, 17), similar compared to 08/30/2019, likely perfusional variation/flash filling hemangiomas. Unchanged coarse calcification along anterior segment 4/8 (2:14). No intra or extrahepatic biliary ductal dilation. Normal gallbladder. Pancreas: No focal lesions or main ductal dilation. Spleen: Multifocal subcentimeter hypoattenuating foci within the spleen, similar dating back multiple studies. Adrenals/Urinary Tract: No adrenal nodules. No suspicious renal mass, calculi or hydronephrosis. No focal bladder wall thickening. Stomach/Bowel: Normal appearance of the stomach. No evidence of bowel wall thickening, distention, or inflammatory changes. Appendix is not discretely seen. Vascular/Lymphatic: Aortic atherosclerosis. Decreased size of 1.9 cm left inguinal lymph node (2:70), previously 2.7 cm. Reproductive: Status post hysterectomy.  No adnexal masses. Other: No free fluid, fluid collection, or free air. 1.4 x 0.5 cm soft tissue focus along the anterior margin of segment 2 (2:14), unchanged dating back to at least 10/10/2018 and not FDG-avid on prior PET study. Musculoskeletal: No acute or abnormal lytic or blastic osseous lesions. Multilevel degenerative changes of the partially imaged thoracic and lumbar spine. Unchanged grade 1 anterolisthesis at L4-5. IMPRESSION: 1. Decreased size of 1.9 cm left inguinal lymph node, previously 2.7 cm. 2. No new or progressive disease in the abdomen or pelvis. Previously noted soft tissue nodule along the anterolateral left hepatic lobe is unchanged dating back to at least 10/10/2018 in retrospect and not FDG-avid on prior PET study. 3.  Aortic Atherosclerosis (ICD10-I70.0). Electronically Signed   By: Limin  Xu M.D.   On: 04/24/2023 20:02

## 2023-04-25 NOTE — Patient Instructions (Signed)

## 2023-04-25 NOTE — Assessment & Plan Note (Signed)
 This has improved in size As above, we will continue to monitor

## 2023-04-25 NOTE — Assessment & Plan Note (Addendum)
 I have reviewed multiple imaging studies with the patient She has positive response to therapy and so far she had not experienced major side effects of treatment except for occasional intermittent hypertension She will continue treatment indefinitely I plan to repeat imaging study again in April We discussed risk and benefits of changing future doses of pembrolizumab  to every 6 weeks and she is in agreement She will receive 200 mg dose today but when she returns in 3 weeks, I will change the dose of 400 mg

## 2023-04-25 NOTE — Assessment & Plan Note (Signed)
 Recent TSH was elevated and the dose of her Synthroid was adjusted TSH from today is pending I will call the patient with test results and will adjust the dose of Synthroid as needed

## 2023-04-25 NOTE — Assessment & Plan Note (Signed)
 Blood pressure is high this morning but at home is typically within normal range We will continue close monitoring of her blood pressure

## 2023-04-26 ENCOUNTER — Other Ambulatory Visit: Payer: Self-pay | Admitting: Hematology and Oncology

## 2023-04-26 ENCOUNTER — Telehealth: Payer: Self-pay

## 2023-04-26 MED ORDER — LEVOTHYROXINE SODIUM 125 MCG PO TABS: 125.0000 ug | ORAL_TABLET | Freq: Every day | ORAL | 1 refills | Status: DC

## 2023-04-26 NOTE — Telephone Encounter (Signed)
 Patient aware of the following information.  Patient voiced appreciation for the call and confirmed that she will pick-up refill from pharmacy.

## 2023-04-26 NOTE — Telephone Encounter (Signed)
-----   Message from Artis Delay sent at 04/26/2023  8:47 AM EST ----- Let her know TSH is good I refilled her syntroid

## 2023-04-27 DIAGNOSIS — M25661 Stiffness of right knee, not elsewhere classified: Secondary | ICD-10-CM | POA: Diagnosis not present

## 2023-04-27 DIAGNOSIS — M1711 Unilateral primary osteoarthritis, right knee: Secondary | ICD-10-CM | POA: Diagnosis not present

## 2023-05-04 NOTE — Telephone Encounter (Signed)
Oral Oncology Patient Advocate Encounter   Received notification re-enrollment for assistance for Lenvima through [place] has been approved. Patient may continue to receive their medication at $0 from this program.    [facility] phone number [phone].   Effective dates: 04/19/23 through 04/17/24  I have spoken to the patient.  Jinger Neighbors, CPhT-Adv Oncology Pharmacy Patient Advocate [facility] Direct Number: [phone]  Fax: [phone]

## 2023-05-04 NOTE — Telephone Encounter (Signed)
Oral Oncology Patient Advocate Encounter  Called and confirmed application is coming up to be processed. Representative stated application should move into final review by end of this week.   They are basing the review time on the patient's last fill on 04/14/23  Krista Johnson, CPhT-Adv Oncology Pharmacy Patient Advocate [facility] Direct Number: [phone]  Fax: [phone]

## 2023-05-09 ENCOUNTER — Ambulatory Visit (INDEPENDENT_AMBULATORY_CARE_PROVIDER_SITE_OTHER): Payer: Medicare Other | Admitting: Podiatry

## 2023-05-09 ENCOUNTER — Encounter: Payer: Self-pay | Admitting: Podiatry

## 2023-05-09 ENCOUNTER — Encounter: Payer: Self-pay | Admitting: Hematology and Oncology

## 2023-05-09 DIAGNOSIS — N183 Chronic kidney disease, stage 3 unspecified: Secondary | ICD-10-CM

## 2023-05-09 DIAGNOSIS — B351 Tinea unguium: Secondary | ICD-10-CM

## 2023-05-09 DIAGNOSIS — M79675 Pain in left toe(s): Secondary | ICD-10-CM

## 2023-05-09 DIAGNOSIS — Z794 Long term (current) use of insulin: Secondary | ICD-10-CM | POA: Diagnosis not present

## 2023-05-09 DIAGNOSIS — E0822 Diabetes mellitus due to underlying condition with diabetic chronic kidney disease: Secondary | ICD-10-CM | POA: Diagnosis not present

## 2023-05-09 DIAGNOSIS — M79674 Pain in right toe(s): Secondary | ICD-10-CM

## 2023-05-09 DIAGNOSIS — G620 Drug-induced polyneuropathy: Secondary | ICD-10-CM

## 2023-05-11 ENCOUNTER — Other Ambulatory Visit: Payer: Self-pay

## 2023-05-14 NOTE — Progress Notes (Signed)
  Subjective:  Patient ID: Krista Johnson, female    DOB: 10/30/1950,  MRN: [id number]  Krista Johnson presents to clinic today for: at risk foot care with h/o neuropathy secondary to chemotherapy and painful elongated mycotic toenails 1-5 bilaterally which are tender when wearing enclosed shoe gear. Pain is relieved with periodic professional debridement.  Chief Complaint  Patient presents with   Diabetes    Patient states her last A1c was 8.0 , patient last saw her PCP in October , patient PCP name is [name]   PCP is [name], MD.  Allergies  Allergen Reactions   Sulfa Antibiotics Nausea Only   Sulfamethoxazole Nausea Only    Review of Systems: Negative except as noted in the HPI.  Objective: No changes noted in today's physical examination. There were no vitals filed for this visit.  Krista Johnson is a pleasant 73 y.o. female WD, WN in NAD. AAO x 3.  Vascular Examination: Capillary refill time <3 seconds b/l LE. Palpable pedal pulses b/l LE. Digital hair present b/l. No pedal edema b/l. Skin temperature gradient WNL b/l. No varicosities b/l. No cyanosis or clubbing noted b/l LE.Marland Kitchen  Dermatological Examination: Pedal skin with normal turgor, texture and tone b/l. No open wounds. No interdigital macerations b/l. Toenails 1-5 b/l thickened, discolored, dystrophic with subungual debris. There is pain on palpation to dorsal aspect of nailplates. No hyperkeratotic nor porokeratotic lesions present on today's visit.Marland Kitchen  Neurological Examination: Protective sensation intact with 10 gram monofilament b/l LE. Pt has subjective symptoms of neuropathy. Proprioception intact bilaterally.  Musculoskeletal Examination: Hammertoe deformity noted 2-5 b/l.     08/09/2022   12:00 AM  Hemoglobin A1C  Hemoglobin-A1c 6.8         This result is from an external source.   Assessment/Plan: 1. Pain due to onychomycosis of toenails of both feet   2. Chemotherapy-induced neuropathy  (HCC)   3. 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 All patient's and/or POA's questions/concerns addressed on today's visit. Toenails 1-5 debrided in length and girth without incident. Continue soft, supportive shoe gear daily. Report any pedal injuries to medical professional. Call office if there are any questions/concerns. -Continue foot and shoe inspections daily. Monitor blood glucose per PCP/Endocrinologist's recommendations. -Patient/POA to call should there be question/concern in the interim.   Return in about 3 months (around 08/07/2023).  Freddie Breech, DPM      [place] LOCATION: [address], [place] [address]                   Office [phone]   [facility] LOCATION: [address] [place], [place] [address] Office [phone]

## 2023-05-19 ENCOUNTER — Inpatient Hospital Stay: Payer: Medicare Other

## 2023-05-19 ENCOUNTER — Encounter: Payer: Self-pay | Admitting: Hematology and Oncology

## 2023-05-19 ENCOUNTER — Inpatient Hospital Stay (HOSPITAL_BASED_OUTPATIENT_CLINIC_OR_DEPARTMENT_OTHER): Payer: Medicare Other | Admitting: Hematology and Oncology

## 2023-05-19 VITALS — BP 173/99 | HR 70

## 2023-05-19 VITALS — BP 153/106 | HR 83 | Temp 97.6°F | Resp 18 | Ht 66.0 in | Wt 187.0 lb

## 2023-05-19 DIAGNOSIS — Z7189 Other specified counseling: Secondary | ICD-10-CM

## 2023-05-19 DIAGNOSIS — C541 Malignant neoplasm of endometrium: Secondary | ICD-10-CM

## 2023-05-19 DIAGNOSIS — I10 Essential (primary) hypertension: Secondary | ICD-10-CM

## 2023-05-19 DIAGNOSIS — C773 Secondary and unspecified malignant neoplasm of axilla and upper limb lymph nodes: Secondary | ICD-10-CM

## 2023-05-19 DIAGNOSIS — C779 Secondary and unspecified malignant neoplasm of lymph node, unspecified: Secondary | ICD-10-CM | POA: Diagnosis not present

## 2023-05-19 DIAGNOSIS — E039 Hypothyroidism, unspecified: Secondary | ICD-10-CM | POA: Diagnosis not present

## 2023-05-19 DIAGNOSIS — Z5112 Encounter for antineoplastic immunotherapy: Secondary | ICD-10-CM | POA: Diagnosis not present

## 2023-05-19 DIAGNOSIS — Z170 Estrogen receptor positive status [ER+]: Secondary | ICD-10-CM | POA: Diagnosis not present

## 2023-05-19 LAB — CBC WITH DIFFERENTIAL (CANCER CENTER ONLY)
Abs Immature Granulocytes: 0.02 10*3/uL (ref 0.00–0.07)
Basophils Absolute: 0 10*3/uL (ref 0.0–0.1)
Basophils Relative: 1 %
Eosinophils Absolute: 0 10*3/uL (ref 0.0–0.5)
Eosinophils Relative: 1 %
HCT: 38.4 % (ref 36.0–46.0)
Hemoglobin: 12.5 g/dL (ref 12.0–15.0)
Immature Granulocytes: 1 %
Lymphocytes Relative: 34 %
Lymphs Abs: 1.4 10*3/uL (ref 0.7–4.0)
MCH: 28.9 pg (ref 26.0–34.0)
MCHC: 32.6 g/dL (ref 30.0–36.0)
MCV: 88.9 fL (ref 80.0–100.0)
Monocytes Absolute: 0.3 10*3/uL (ref 0.1–1.0)
Monocytes Relative: 7 %
Neutro Abs: 2.4 10*3/uL (ref 1.7–7.7)
Neutrophils Relative %: 56 %
Platelet Count: 176 10*3/uL (ref 150–400)
RBC: 4.32 MIL/uL (ref 3.87–5.11)
RDW: 13.2 % (ref 11.5–15.5)
WBC Count: 4.2 10*3/uL (ref 4.0–10.5)
nRBC: 0 % (ref 0.0–0.2)

## 2023-05-19 LAB — CMP (CANCER CENTER ONLY)
ALT: 16 U/L (ref 0–44)
AST: 19 U/L (ref 15–41)
Albumin: 3.9 g/dL (ref 3.5–5.0)
Alkaline Phosphatase: 71 U/L (ref 38–126)
Anion gap: 6 (ref 5–15)
BUN: 29 mg/dL — ABNORMAL HIGH (ref 8–23)
CO2: 23 mmol/L (ref 22–32)
Calcium: 9.7 mg/dL (ref 8.9–10.3)
Chloride: 111 mmol/L (ref 98–111)
Creatinine: 1.34 mg/dL — ABNORMAL HIGH (ref 0.44–1.00)
GFR, Estimated: 42 mL/min — ABNORMAL LOW (ref >60)
Glucose, Bld: 144 mg/dL — ABNORMAL HIGH (ref 70–99)
Potassium: 3.8 mmol/L (ref 3.5–5.1)
Sodium: 140 mmol/L (ref 135–145)
Total Bilirubin: 0.6 mg/dL (ref 0.0–1.2)
Total Protein: 6.5 g/dL (ref 6.5–8.1)

## 2023-05-19 LAB — TSH: TSH: 2.696 u[IU]/mL (ref 0.350–4.500)

## 2023-05-19 MED ORDER — SODIUM CHLORIDE 0.9 % IV SOLN
400.0000 mg | Freq: Once | INTRAVENOUS | Status: AC
  Administered 2023-05-19: 400 mg via INTRAVENOUS
  Filled 2023-05-19: qty 16

## 2023-05-19 MED ORDER — HEPARIN SOD (PORK) LOCK FLUSH 100 UNIT/ML IV SOLN
500.0000 [IU] | Freq: Once | INTRAVENOUS | Status: AC | PRN
  Administered 2023-05-19: 500 [IU]

## 2023-05-19 MED ORDER — SODIUM CHLORIDE 0.9% FLUSH
10.0000 mL | INTRAVENOUS | Status: DC | PRN
  Administered 2023-05-19: 10 mL

## 2023-05-19 MED ORDER — SODIUM CHLORIDE 0.9% FLUSH
10.0000 mL | Freq: Once | INTRAVENOUS | Status: AC
  Administered 2023-05-19: 10 mL

## 2023-05-19 MED ORDER — SODIUM CHLORIDE 0.9 % IV SOLN: Freq: Once | INTRAVENOUS | Status: AC

## 2023-05-19 NOTE — Progress Notes (Signed)
[facility] OFFICE PROGRESS NOTE  Patient Care Team: [name], MD as PCP - General (Family Medicine)  ASSESSMENT & PLAN:  Endometrial cancer [facility]) Her last imaging in Dec showed positive response to therapy and so far she had not experienced major side effects of treatment except for occasional intermittent hypertension She will continue treatment indefinitely I plan to repeat imaging study again in April We discussed risk and benefits of changing future doses of pembrolizumab to every 6 weeks and she is in agreement   Essential hypertension Blood pressure is high this morning Her home blood pressure monitoring showed normal systolic but typically borderline high diastolic numbers We discussed risk and benefits of additional blood pressure medication versus lifestyle changes and she elected for lifestyle changes I will order urine protein in her next visit  Orders Placed This Encounter  Procedures   CBC with Differential (Cancer Center Only)    Standing Status:   Future    Expected Date:   06/30/2023    Expiration Date:   06/29/2024   CMP (Cancer Center only)    Standing Status:   Future    Expected Date:   06/30/2023    Expiration Date:   06/29/2024   T4    Standing Status:   Future    Expected Date:   06/30/2023    Expiration Date:   06/29/2024   TSH    Standing Status:   Future    Expected Date:   06/30/2023    Expiration Date:   06/29/2024   Total Protein, Urine dipstick    Standing Status:   Future    Expected Date:   06/30/2023    Expiration Date:   06/29/2024    All questions were answered. The patient knows to call the clinic with any problems, questions or concerns. The total time spent in the appointment was 25 minutes encounter with patients including review of chart and various tests results, discussions about plan of care and coordination of care plan   Artis Delay, MD 05/19/2023 9:53 AM  INTERVAL HISTORY: Please see below for problem oriented  charting. she returns for chemo follow-up She tolerated treatment well Her documented blood pressure from home typically show systolic blood pressure in the 130s and diastolic in the high 80s She denies signs or symptoms of hypertension  REVIEW OF SYSTEMS:   Constitutional: Denies fevers, chills or abnormal weight loss Eyes: Denies blurriness of vision Ears, nose, mouth, throat, and face: Denies mucositis or sore throat Respiratory: Denies cough, dyspnea or wheezes Cardiovascular: Denies palpitation, chest discomfort or lower extremity swelling Gastrointestinal:  Denies nausea, heartburn or change in bowel habits Skin: Denies abnormal skin rashes Lymphatics: Denies new lymphadenopathy or easy bruising Neurological:Denies numbness, tingling or new weaknesses Behavioral/Psych: Mood is stable, no new changes  All other systems were reviewed with the patient and are negative.  I have reviewed the past medical history, past surgical history, social history and family history with the patient and they are unchanged from previous note.  ALLERGIES:  is allergic to sulfa antibiotics and sulfamethoxazole.  MEDICATIONS:  Current Outpatient Medications  Medication Sig Dispense Refill   amLODipine-valsartan (EXFORGE) 5-160 MG tablet Take 1 tablet by mouth daily after breakfast.     aspirin EC 81 MG tablet Take 81 mg by mouth daily. Swallow whole.     Azelastine HCl 0.15 % SOLN Place 1-2 sprays into both nostrils daily as needed (sinus congestion/issues.). 30 mL 2   calcium elemental as carbonate (  BARIATRIC TUMS ULTRA) 400 MG chewable tablet Chew 2 tablets by mouth daily in the afternoon.     diphenhydrAMINE (BENADRYL) 25 MG tablet Take 25 mg by mouth daily as needed (runny nose/sinus issues.).     ergocalciferol (VITAMIN D2) 1.25 MG (50000 UT) capsule Take 1 capsule (50,000 Units total) by mouth once a week. (Patient taking differently: Take 50,000 Units by mouth 2 (two) times a week. Wednesdays &  Saturdays.) 25 capsule 1   fexofenadine (ALLEGRA) 180 MG tablet Take 180 mg by mouth daily as needed for allergies or rhinitis.     glipiZIDE (GLUCOTROL) 5 MG tablet Take 1 tablet (5 mg total) by mouth 2 (two) times daily before a meal. 180 tablet 3   glucose blood (ONETOUCH VERIO) test strip Use as instructed to check blood sugars 3 times a day. 100 each 3   lenvatinib 10 mg daily dose (LENVIMA, 10 MG DAILY DOSE,) capsule Take 1 capsule (10 mg total) by mouth daily. 30 capsule 11   levothyroxine (SYNTHROID) 125 MCG tablet Take 1 tablet (125 mcg total) by mouth daily before breakfast. 90 tablet 1   lidocaine-prilocaine (EMLA) cream Apply 1 Application topically daily as needed (prior to port access.). 30 g 3   OneTouch Delica Lancets 33G MISC 1 each by Other route 2 (two) times daily. Use to monitor glucose levels BID; E11.65 100 each 2   RELION PEN NEEDLES 32G X 4 MM MISC USE 1 PEN PER DAY 50 each 5   simvastatin (ZOCOR) 10 MG tablet Take 10 mg by mouth at bedtime.      spironolactone (ALDACTONE) 25 MG tablet Take 25 mg by mouth as needed.     No current facility-administered medications for this visit.   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 Pennie Rushing who obtained an endometrial biopsy  showing poorly differentiated endometrial carcinoma and negative endocervical curettage   12/12/2017 Imaging   MAMMOGRAM FINDINGS: In the right axilla, a possible mass warrants further evaluation. In the left breast, no findings suspicious for malignancy.   Images were processed with CAD.   IMPRESSION: Further evaluation is suggested for possible mass in the right axilla.     12/24/2017 Imaging   Ct scan chest, abdomen and pelvis 1. Marked thickening of the endometrium (42 mm) compatible with known primary endometrial malignancy. No evidence of extrauterine invasion. 2. No pelvic or retroperitoneal adenopathy. 3. Right middle lobe 4 mm solid pulmonary nodule, for which follow-up chest CT is advised in 3-6 months. 4. Several findings that are equivocal for distant metastatic disease. Vaguely nodular heterogeneous hyperenhancement in the peripheral right liver lobe, which could represent benign transient perfusional phenomena, with underlying liver lesions not entirely excluded. Mildly sclerotic T12 vertebral lesion. Mildly enlarged right axillary lymph node. The best  single test to further evaluate these findings would be a PET-CT. Alternative tests include bone scan or thoracic MRI without and with IV contrast for the T12 osseous lesion, MRI abdomen without and with IV contrast for the liver findings, and diagnostic mammographic evaluation for the right axillary node. 5.  Aortic Atherosclerosis (ICD10-I70.0).     01/09/2018 PET scan   1. Moderate hypermetabolism corresponding to enlarging axillary node since 12/22/2017. Highly suspicious for an atypical distribution of metastatic disease. 2. No hypermetabolism to suggest hepatic or T12 osseous metastasis. 3. Hypermetabolic endometrial primary.   01/23/2018 Initial Diagnosis   Endometrial cancer (HCC)   01/23/2018 Pathology Results   1. Lymph node, sentinel, biopsy, left external iliac - NO CARCINOMA IDENTIFIED IN ONE LYMPH NODE (0/1) - SEE  COMMENT 2. Lymph nodes, regional resection, right para aortic - NO CARCINOMA IDENTIFIED IN FOUR LYMPH NODES (0/4) - SEE COMMENT 3. Lymph nodes, regional resection, right pelvic - NO CARCINOMA IDENTIFIED IN EIGHT LYMPH NODES (0/8) - SEE COMMENT 4. Cul-de-sac biopsy - METASTATIC CARCINOMA 5. Uterus +/- tubes/ovaries, neoplastic, cervix, bilateral fallopian tubes and ovaries UTERUS: - MIXED SEROUS AND ENDOMETRIOID CARCINOMA - SEROSAL IMPLANTS PRESENT - LYMPHOVASCULAR SPACE INVASION PRESENT - LEIOMYOMATA (1.5 CM; LARGEST) - SEE ONCOLOGY TABLE AND COMMENT BELOW CERVIX: - BENIGN NABOTHIAN CYSTS - NO CARCINOMA IDENTIFIED BILATERAL OVARIES: - METASTATIC CARCINOMA PRESENT ON OVARIAN SURFACE BILATERAL FALLOPIAN TUBES: - INTRALUMINAL CARCINOMA Microscopic Comment 1. -3. Cytokeratin AE1/3 was performed on the sentinel lymph nodes to exclude micrometastasis. There is no evidence of metastatic carcinoma by immunohistochemistry. 5. 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%) Histologic Grade: N/A Myometrial invasion: Estimated less than 50% myometrial invasion (0.3 cm of myometrium involved; 1.4 cm measured thickness) Uterine Serosa Involvement: Present Cervical stromal involvement: Not identified Extent of involvement of other organs: - Fallopian tube (left within the lumen) - Ovary, left (surface involvement) - Cul-de-sac Lymphovascular invasion: Present Regional Lymph Nodes: Examined: 1 Sentinel 12 Non-sentinel 13 Total Lymph nodes with metastasis: 0 Isolated tumor cells (< 0.2 mm): 0 Micrometastasis: (> 0.2 mm and < 2.0 mm): 0 Macrometastasis: (> 2.0 mm): 0 Extracapsular extension: N/A Tumor block for ancillary studies: 5E, 5B MMR / MSI testing:  Pending will be reported separately Pathologic Stage Classification (pTNM, AJCC 8th edition): pT3a, pN0 FIGO Stage: IIIA COMMENT: There is tumor present on the surface of the left ovary and within the lumen of the left fallopian tube. The carcinoma appears to be mixed with the largest component being high grade serous carcinoma. Dr. Colonel Bald reviewed the case and agrees with the above diagnosis.   01/23/2018 Surgery   Surgeon: Quinn Axe     Operation: Robotic-assisted laparoscopic total hysterectomy with bilateral salpingoophorectomy, SLN injection, mapping and biopsy, right pelvic and para-aortic lymphadenectomy   Operative Findings:  : 10-12cm bulky uterus with frank serosal involvement on posterior cul de sac peritoneum and anterior peritoneum which adhesed the bladder to the anterior uterus. Unilateral mapping on left pelvis. No grossly suspicious nodes. Normal omentum and diaphragms.    02/01/2018 Pathology Results   Lymph node, needle/core biopsy, right axilla - METASTATIC CARCINOMA - SEE COMMENT Microscopic Comment The neoplastic cells are positive for cytokeratin 7 and Pax-8 but negative for cytokeratin 5/6, cytokeratin 20, Gata-3, p63, p53 and GCDFP. Overall, the immunoprofile is consistent with metastasis from the patient's known gynecologic carcinoma.  02/01/2018 Procedure   Ultrasound-guided core biopsies of a suspicious right axillary lymph node.   02/21/2018 - 06/13/2018 Chemotherapy   The patient had carboplatin & Taxol x 6   03/26/2018 - 04/30/2018 Radiation Therapy   Radiation treatment dates:   03/26/18, 04/02/18, 04/19/18, 04/23/18 04/30/18   Site/dose: proximal Vagina, 6 Gy in 5 fractions for a total dose of 30 Gy     04/26/2018 PET scan   1. Interval resection of the hypermetabolic endometrial primary. No discernible hypermetabolic pelvic sidewall or peritoneal metastases. 2. Stable hypermetabolic bilateral axillary lymphadenopathy. The degree of hypermetabolism in  these lymph nodes remains highly suspicious for neoplasm. 3. Interval development of low level FDG uptake in 2 stable, small lymph nodes in the left groin region. This may be reactive, but close attention recommended to exclude metastatic involvement. 4. Stable non hypermetabolic low-density liver lesions and the mixed lucent and sclerotic T12 lesion is stable without hypermetabolism today.   07/09/2018 Imaging   Status post hysterectomy.   Mild axillary lymphadenopathy, improved. Nodal metastases not excluded.   Irregular wall thickening involving the anterior bladder, possibly reflecting radiation cystitis versus tumor.   Additional stable findings as above, including a 5 mm right middle lobe nodule and stable sclerosis involving the T12 vertebral body.   10/10/2018 Imaging   1. Stable exam. No findings within the abdomen or pelvis to suggest metastatic disease. 2. Unchanged appearance of sclerosis involving the T12 vertebra. 3.  Aortic Atherosclerosis (ICD10-I70.0).   10/10/2018 Imaging   Ct abdomen and pelvis 1. Stable exam. No findings within the abdomen or pelvis to suggest metastatic disease. 2. Unchanged appearance of sclerosis involving the T12 vertebra. 3.  Aortic Atherosclerosis (ICD10-I70.0).   01/16/2019 PET scan   1. Enlargement and increased activity in the left axillary, right axillary, and left inguinal adenopathy compatible with progressive malignancy. 2. Stable 4 mm right middle lobe subpleural nodule, not appreciably hypermetabolic. 3. Other imaging findings of potential clinical significance: Right kidney lower pole cyst. Aortic Atherosclerosis (ICD10-I70.0). Prominent stool throughout the colon favors constipation. Mild chronic left maxillary sinusitis.     02/01/2019 -  Chemotherapy   The patient had pembrolizumab and Lenvima for chemotherapy treatment.     04/25/2019 Imaging   Ct imaging 1. Interval response to therapy. Decrease in size of bilateral axillary  and left inguinal lymph nodes. No new or progressive findings identified. 2. Stable sclerotic appearance of the T12 vertebra. 3. Aortic atherosclerosis. Lad coronary artery calcification noted.   08/30/2019 Imaging   1. Bulky RIGHT hilar lymph node, slightly increased in size from previous imaging. 2. Multiple foci of hyperenhancement in the liver. 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 Consider MRI liver on follow-up to establish a baseline for number of lesions as these will appear variable on CT follow-up based on phase of contrast acquisition in the future. 3. Stable 5 mm nodule adjacent to the fissure, minor fissure in the RIGHT middle lobe.     11/28/2019 Imaging   1. Stable exam. No new or progressive findings. 2. Stable enlarged right axillary lymph node. 3. Stable tiny bilateral pulmonary nodules. Continued attention on follow-up recommended. 4. Scattered hypodensities in the liver parenchyma correspond to the hypervascular lesions seen on the previous study performed with intravenous contrast material. 5. Right renal  cyst. 6. Aortic Atherosclerosis (ICD10-I70.0).     03/16/2020 Imaging   Increased echogenicity of renal parenchyma is noted bilaterally suggesting medical renal disease. No hydronephrosis or renal obstruction is noted   04/02/2020 Imaging   1. Right axillary lymphadenopathy is stable. Tiny bilateral pulmonary nodules are stable. Subcentimeter low-attenuation liver lesions are stable. 2. No new or progressive metastatic disease in the chest, abdomen or pelvis. 3. Aortic Atherosclerosis (ICD10-I70.0).   09/15/2020 Imaging   1. Stable RIGHT axillary lymph node with enlargement measuring 2 cm short axis. 2. Stable small nodule along the minor fissure in the RIGHT  chest. 3. No new or progressive findings. 4. Aortic atherosclerosis.   03/24/2021 Imaging   1. Right axillary lymphadenopathy is stable compared to prior studies. 2. No other definite signs of metastatic disease elsewhere in the thorax. 3. Hepatic steatosis. Numerous hypervascular areas noted in the liver, incompletely characterized on today's arterial phase examination. These are similar to remote prior examination from 08/30/2019, likely to represent small benign lesions such as flash fill cavernous hemangiomas. These could be definitively evaluated with nonemergent abdominal MRI with and without IV gadolinium if of clinical concern. 4. Aortic atherosclerosis, in addition to left main and left anterior descending coronary artery disease. Assessment for potential risk factor modification, dietary therapy or pharmacologic therapy may be warranted, if clinically indicated.   Aortic Atherosclerosis (ICD10-I70.0).   09/16/2021 Imaging   1. Unchanged enlarged right axillary lymph node. No evidence of new lymphadenopathy or metastatic disease in the chest, abdomen, or pelvis. 2. Subcentimeter hyperenhancing lesions of the right lobe of the liver, hepatic segments VII and VIII are unchanged, measuring up to 0.7 cm. These are likely small benign incidental flash filling hemangiomata although incompletely characterized on this examination. Attention on follow-up. 3. Status post hysterectomy. 4. Cholelithiasis. 5. Coronary artery disease.   Aortic Atherosclerosis (ICD10-I70.0).     12/31/2021 - 03/04/2022 Chemotherapy   Patient is on Treatment Plan : UTERINE Lenvatinib (20) D1-21 + Pembrolizumab (200) D1 q21d     03/24/2022 Imaging   1. Similar to mild enlargement of an isolated right axillary nodal mass. 2. No new sites of disease identified. 3.  Possible constipation. 4. Coronary artery atherosclerosis. Aortic Atherosclerosis (ICD10-I70.0).   04/22/2022 Pathology Results   A. AXILLARY, RIGHT,  LYMPH NODE:  -  1 of 3 lymph nodes positive for metastatic carcinoma, consistent with  the patient's known endometrial carcinoma (i.e. morphologically consistent with papillary serous carcinoma) with areas of geographic necrosis, negative for extracapsular extension on sections examined.    04/22/2022 Surgery   Preoperative Diagnosis: RIGHT AXILLARY LYMPHADENOPATHY    Postoperative Diagnosis: RIGHT AXILLARY LYMPHADENOPATHY    Surgical Procedure: RIGHT AXILLARY LYMPH NODE BIOPSY:     Operative Team Members:  Surgeon(s) and Role:    * Stechschulte, Hyman Hopes, MD - Primary     Specimen:  ID Type Source Tests Collected by Time Destination  1 : Right Axillary Lymph Node Tissue PATH Lymph nodes regional SURGICAL PATHOLOGY Stechschulte, Hyman Hopes, MD 04/22/2022 1040         Indications for Procedure: HARLEM THRESHER is a 73 y.o. female who presented with right axillary lymphadenopathy. I recommended right axillary lymph node biopsy. The procedure itself as well as its risk, benefits, and alternatives were discussed the patient in full. After full discussion all questions answered the patient granted consent to proceed.    Findings: Firm enlarged right axillary lymph node    Description of Procedure:    On the  date stated above the patient was taken operating room placed in supine position.  Anesthesia was induced.  A timeout was completed verifying correct patient, procedure, positioning, and equipment needed for the case.  The patient's chest and right axilla was prepped and draped in the usual sterile fashion.   I made a incision along the hairline anterior to the axilla and dissected through the subcutaneous tissues into the axilla.  Open the axillary fascia and palpated for the lymph node.  The lymph node was dissected circumferentially.  The blood supply of the lymph node was controlled using two 2-0 Vicryl sutures.  Was passed off the field as a specimen.  The wound was irrigated and hemostasis was  obtained.  The wound was closed in layers with 2-0 Vicryl suture running the deeper layers together and 4-0 Monocryl and Dermabond for the skin.  All sponge needle counts were correct at the end of the case.   07/21/2022 Imaging   CT CHEST ABDOMEN PELVIS W CONTRAST  Result Date: 07/20/2022 CLINICAL DATA:  Cervical/endometrial cancer/evaluate treatment response. Lung nodules. * Tracking Code: BO * EXAM: CT CHEST, ABDOMEN, AND PELVIS WITH CONTRAST TECHNIQUE: Multidetector CT imaging of the chest, abdomen and pelvis was performed following the standard protocol during bolus administration of intravenous contrast. RADIATION DOSE REDUCTION: This exam was performed according to the departmental dose-optimization program which includes automated exposure control, adjustment of the mA and/or kV according to patient size and/or use of iterative reconstruction technique. CONTRAST:  80mL OMNIPAQUE IOHEXOL 300 MG/ML  SOLN COMPARISON:  03/23/2022 FINDINGS: CT CHEST FINDINGS Cardiovascular: Right Port-A-Cath tip superior caval/atrial junction. Aortic atherosclerosis. Tortuous thoracic aorta. Mild cardiomegaly, without pericardial effusion. Lad coronary artery calcification. No central pulmonary embolism, on this non-dedicated study. Mediastinum/Nodes: No supraclavicular adenopathy. Resolution of right axillary adenopathy. No mediastinal or hilar adenopathy. Lungs/Pleura: No pleural fluid. A Perifissural right middle lobe 4 mm pulmonary nodule on 67/5 is unchanged and likely a subpleural lymph node. Left upper lobe 4-5 mm ground-glass nodule is unchanged on 39/5. Musculoskeletal: No acute osseous abnormality. 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 No hypermetabolism on prior PET. CT ABDOMEN PELVIS FINDINGS Hepatobiliary: Normal liver. Normal gallbladder, without biliary ductal dilatation. Pancreas: Normal, without mass or ductal dilatation. Spleen: Normal in  size, without focal abnormality. Adrenals/Urinary Tract: Normal adrenal glands. Normal left kidney. Exophytic inter/lower pole right renal 3.7 cm cyst . In the absence of clinically indicated signs/symptoms require(s) no independent follow-up. No hydronephrosis. Normal urinary bladder. Stomach/Bowel: Gastric antral underdistention. Colonic stool burden suggests constipation. Normal appendix. Normal small bowel. Vascular/Lymphatic: Aortic atherosclerosis. Small abdominal retroperitoneal nodes are similar, not pathologic by size criteria. No abdominopelvic adenopathy. Reproductive: Hysterectomy.  No adnexal mass. Other: No significant free fluid. No free intraperitoneal air. No evidence of omental or peritoneal disease. Musculoskeletal: Osteopenia. Trace L4-5 anterolisthesis with prominent disc bulges at L3-4 through L5-S1. IMPRESSION: 1. Resolution of right axillary adenopathy. 2. No evidence of residual metastatic disease. 3.  Possible constipation. 4. Coronary artery atherosclerosis. Aortic Atherosclerosis (ICD10-I70.0). Electronically Signed   By: Jeronimo Greaves M.D.   On: 07/20/2022 16:17      01/06/2023 Imaging   CT CHEST ABDOMEN PELVIS W CONTRAST  Result Date: 01/06/2023 CLINICAL DATA:  History of metastatic uterine cancer. Follow-up. * Tracking Code: BO * EXAM: CT CHEST, ABDOMEN, AND PELVIS WITH CONTRAST TECHNIQUE: Multidetector CT imaging of the chest, abdomen and pelvis was performed following the standard protocol during bolus  administration of intravenous contrast. RADIATION DOSE REDUCTION: This exam was performed according to the departmental dose-optimization program which includes automated exposure control, adjustment of the mA and/or kV according to patient size and/or use of iterative reconstruction technique. CONTRAST:  OMNIPAQUE IOHEXOL 300 MG/ML  SOLN COMPARISON:  CT July 20, 2022 FINDINGS: CT CHEST FINDINGS Cardiovascular: Right chest Port-A-Cath with tip at the superior cavoatrial  junction. Normal caliber thoracic aorta. No central pulmonary embolus on this nondedicated study. Normal size heart. No significant pericardial effusion/thickening. Mediastinum/Nodes: No suspicious thyroid nodule. No pathologically enlarged mediastinal, hilar or axillary lymph nodes. Mild symmetric esophageal wall thickening. Lungs/Pleura: Stable 4 mm right middle lobe perifissural pulmonary nodule on image 69/4. No new suspicious pulmonary nodules or masses. No pleural effusion. No pneumothorax. Musculoskeletal: Similar appearance of the eccentric left T12 sclerosis present dating back to 2021 without hypermetabolism on prior PET-CT. No aggressive lytic or blastic lesion of bone. CT ABDOMEN PELVIS FINDINGS Hepatobiliary: New enhancing soft tissue nodule along the anterior capsule of the lateral left lobe of the liver measuring 12 x 7 mm on image 54/2. No suspicious intrahepatic mass. Gallbladder is unremarkable. No biliary ductal dilation. Pancreas: No pancreatic ductal dilation or evidence of acute inflammation. Spleen: No splenomegaly. Adrenals/Urinary Tract: Bilateral adrenal glands appear normal. No hydronephrosis. Stable benign right renal cysts. Urinary bladder is unremarkable for degree of distension. Stomach/Bowel: No radiopaque enteric contrast material was administered. Stomach is unremarkable for degree of distension. No pathologic dilation of small or large bowel. No evidence of acute bowel inflammation. Vascular/Lymphatic: Aortic atherosclerosis.  Smooth IVC contours. New pathologically enlarged left inguinal lymph node measures 2.7 cm in short axis on image 110/2. Reproductive: Uterus is surgically absent without suspicious enhancing nodularity along the vaginal cuff. No suspicious adnexal mass. Other: No significant abdominopelvic free fluid. Tiny periumbilical fat containing hernia. Musculoskeletal: No aggressive lytic or blastic lesion of bone. Diffuse demineralization of bone. Multilevel  degenerative change of the spine. 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 IMPRESSION: 1. New enhancing 12 x 7 mm soft tissue nodule along the anterior capsule of the lateral left lobe of the liver no significant abdominopelvic free fluid., Suspicious for peritoneal metastatic disease. 2. New pathologically enlarged left inguinal lymph node measures 2.7 cm in short axis, suspicious for nodal metastatic disease. 3. Mild symmetric esophageal wall thickening, correlate for esophagitis. 4.  Aortic Atherosclerosis (ICD10-I70.0). These results will be called to the ordering clinician or representative by the Radiologist Assistant, and communication documented in the PACS or [employer]. Electronically Signed   By: Maudry Mayhew M.D.   On: 01/06/2023 14:55      01/20/2023 -  Chemotherapy   Patient is on Treatment Plan : UTERINE Lenvatinib (20) D1-21 + Pembrolizumab (200) D1 q21d     04/18/2023 Imaging   CT ABDOMEN PELVIS W CONTRAST Result Date: 04/24/2023 CLINICAL DATA:  History of metastatic endometrial carcinoma. Treatment response. * Tracking Code: BO * EXAM: CT ABDOMEN AND PELVIS WITH CONTRAST TECHNIQUE: Multidetector CT imaging of the abdomen and pelvis was performed using the standard protocol following bolus administration of intravenous contrast. RADIATION DOSE REDUCTION: This exam was performed according to the departmental dose-optimization program which includes automated exposure control, adjustment of the mA and/or kV according to patient size and/or use of iterative reconstruction technique. CONTRAST:  OMNIPAQUE IOHEXOL 300 MG/ML  SOLN COMPARISON:  CT abdomen and pelvis dated 01/06/2023 and multiple priors FINDINGS: Lower chest: No focal consolidation or pulmonary  nodule in the lung bases. No pleural effusion or pneumothorax demonstrated. Partially imaged heart size is normal. Hepatobiliary: Ill-defined peripheral hyperattenuating areas  within segment 8 (2:15, 17), similar compared to 08/30/2019, likely perfusional variation/flash filling hemangiomas. Unchanged coarse calcification along anterior segment 4/8 (2:14). No intra or extrahepatic biliary ductal dilation. Normal gallbladder. Pancreas: No focal lesions or main ductal dilation. Spleen: Multifocal subcentimeter hypoattenuating foci within the spleen, similar dating back multiple studies. Adrenals/Urinary Tract: No adrenal nodules. No suspicious renal mass, calculi or hydronephrosis. No focal bladder wall thickening. Stomach/Bowel: Normal appearance of the stomach. No evidence of bowel wall thickening, distention, or inflammatory changes. Appendix is not discretely seen. Vascular/Lymphatic: Aortic atherosclerosis. Decreased size of 1.9 cm left inguinal lymph node (2:70), previously 2.7 cm. Reproductive: Status post hysterectomy.  No adnexal masses. Other: No free fluid, fluid collection, or free air. 1.4 x 0.5 cm soft tissue focus along the anterior margin of segment 2 (2:14), unchanged dating back to at least 10/10/2018 and not FDG-avid on prior PET study. Musculoskeletal: No acute or abnormal lytic or blastic osseous lesions. Multilevel degenerative changes of the partially imaged thoracic and lumbar spine. Unchanged grade 1 anterolisthesis at L4-5. IMPRESSION: 1. Decreased size of 1.9 cm left inguinal lymph node, previously 2.7 cm. 2. No new or progressive disease in the abdomen or pelvis. Previously noted soft tissue nodule along the anterolateral left hepatic lobe is unchanged dating back to at least 10/10/2018 in retrospect and not FDG-avid on prior PET study. 3.  Aortic Atherosclerosis (ICD10-I70.0). Electronically Signed   By: Agustin Cree M.D.   On: 04/24/2023 20:02        PHYSICAL EXAMINATION: ECOG PERFORMANCE STATUS: 0 - Asymptomatic  Vitals:   05/19/23 0857  BP: (!) 153/106  Pulse: 83  Resp: 18  Temp: 97.6 F (36.4 C)  SpO2: 99%   Filed Weights   05/19/23 0857   Weight: 187 lb (84.8 kg)    GENERAL:alert, no distress and comfortable LABORATORY DATA:  I have reviewed the data as listed    Component Value Date/Time   NA 140 05/19/2023 0825   K 3.8 05/19/2023 0825   CL 111 05/19/2023 0825   CO2 23 05/19/2023 0825   GLUCOSE 144 (H) 05/19/2023 0825   BUN 29 (H) 05/19/2023 0825   CREATININE 1.34 (H) 05/19/2023 0825   CALCIUM 9.7 05/19/2023 0825   PROT 6.5 05/19/2023 0825   ALBUMIN 3.9 05/19/2023 0825   AST 19 05/19/2023 0825   ALT 16 05/19/2023 0825   ALKPHOS 71 05/19/2023 0825   BILITOT 0.6 05/19/2023 0825   GFRNONAA 42 (L) 05/19/2023 0825   GFRAA 34 (L) 01/10/2020 1200    No results found for: "SPEP", "UPEP"  Lab Results  Component Value Date   WBC 4.2 05/19/2023   NEUTROABS 2.4 05/19/2023   HGB 12.5 05/19/2023   HCT 38.4 05/19/2023   MCV 88.9 05/19/2023   PLT 176 05/19/2023      Chemistry      Component Value Date/Time   NA 140 05/19/2023 0825   K 3.8 05/19/2023 0825   CL 111 05/19/2023 0825   CO2 23 05/19/2023 0825   BUN 29 (H) 05/19/2023 0825   CREATININE 1.34 (H) 05/19/2023 0825      Component Value Date/Time   CALCIUM 9.7 05/19/2023 0825   ALKPHOS 71 05/19/2023 0825   AST 19 05/19/2023 0825   ALT 16 05/19/2023 0825   BILITOT 0.6 05/19/2023 0825

## 2023-05-19 NOTE — Patient Instructions (Signed)
 [facility] - A DEPT OF [facility]  Discharge Instructions: Thank you for choosing [facility] to provide your oncology and hematology care.   If you have a lab appointment with the Cancer Center, please go directly to the Cancer Center and check in at the registration area.   Wear comfortable clothing and clothing appropriate for easy access to any Portacath or PICC line.   We strive to give you quality time with your provider. You may need to reschedule your appointment if you arrive late (15 or more minutes).  Arriving late affects you and other patients whose appointments are after yours.  Also, if you miss three or more appointments without notifying the office, you may be dismissed from the clinic at the provider's discretion.      For prescription refill requests, have your pharmacy contact our office and allow 72 hours for refills to be completed.    Today you received the following chemotherapy and/or immunotherapy agents: Keytruda      To help prevent nausea and vomiting after your treatment, we encourage you to take your nausea medication as directed.  BELOW ARE SYMPTOMS THAT SHOULD BE REPORTED IMMEDIATELY: *FEVER GREATER THAN 100.4 F (38 C) OR HIGHER *CHILLS OR SWEATING *NAUSEA AND VOMITING THAT IS NOT CONTROLLED WITH YOUR NAUSEA MEDICATION *UNUSUAL SHORTNESS OF BREATH *UNUSUAL BRUISING OR BLEEDING *URINARY PROBLEMS (pain or burning when urinating, or frequent urination) *BOWEL PROBLEMS (unusual diarrhea, constipation, pain near the anus) TENDERNESS IN MOUTH AND THROAT WITH OR WITHOUT PRESENCE OF ULCERS (sore throat, sores in mouth, or a toothache) UNUSUAL RASH, SWELLING OR PAIN  UNUSUAL VAGINAL DISCHARGE OR ITCHING   Items with * indicate a potential emergency and should be followed up as soon as possible or go to the Emergency Department if any problems should occur.  Please show the CHEMOTHERAPY ALERT CARD or IMMUNOTHERAPY  ALERT CARD at check-in to the Emergency Department and triage nurse.  Should you have questions after your visit or need to cancel or reschedule your appointment, please contact [facility] - A DEPT OF [facility]  Dept: [phone]  and follow the prompts.  Office hours are 8:00 a.m. to 4:30 p.m. Monday - Friday. Please note that voicemails left after 4:00 p.m. may not be returned until the following business day.  We are closed weekends and major holidays. You have access to a nurse at all times for urgent questions. Please call the main number to the clinic Dept: [phone] and follow the prompts.   For any non-urgent questions, you may also contact your provider using MyChart. We now offer e-Visits for anyone 107 and older to request care online for non-urgent symptoms. For details visit [email or web address].   Also download the MyChart app! Go to the app store, search "MyChart", open the app, select [facility], and log in with your MyChart username and password.

## 2023-05-19 NOTE — Assessment & Plan Note (Signed)
Blood pressure is high this morning Her home blood pressure monitoring showed normal systolic but typically borderline high diastolic numbers We discussed risk and benefits of additional blood pressure medication versus lifestyle changes and she elected for lifestyle changes I will order urine protein in her next visit

## 2023-05-19 NOTE — Assessment & Plan Note (Signed)
Her last imaging in Dec showed positive response to therapy and so far she had not experienced major side effects of treatment except for occasional intermittent hypertension She will continue treatment indefinitely I plan to repeat imaging study again in April We discussed risk and benefits of changing future doses of pembrolizumab to every 6 weeks and she is in agreement

## 2023-05-20 LAB — T4: T4, Total: 8.4 ug/dL (ref 4.5–12.0)

## 2023-06-12 NOTE — Progress Notes (Unsigned)
 Name: Krista Johnson  Age/ Sex: 73 y.o., female   MRN/ DOB: [id number], January 20, 1951     PCP: [name], MD   Reason for Endocrinology Evaluation: Type 2 Diabetes Mellitus  Initial Endocrine Consultative Visit: 12/26/2017    PATIENT IDENTIFIER: Krista Johnson is a 73 y.o. female with a past medical history of DM, HTN, hypothyroid, CKD, Hx endometrial cancer. The patient has followed with Endocrinology clinic since 12/26/2017 for consultative assistance with management of her diabetes.  DIABETIC HISTORY:  [name] was diagnosed with DM 2004, she was on insulin between 2019-2022 while on chemotherapy. Her hemoglobin A1c has ranged from 5.8% in 2022, peaking at 14.9% in 2019. She was following up with Dr. Everardo All, subsequently saw Dr. Lucianne Muss 09/2021  Jardiance caused constipation   SUBJECTIVE:   During the last visit (03/10/2023): A1c 8.0%  Today (06/13/2023): Ms. [name] is here for follow-up on diabetes management.  She checks her blood sugars occasionally . The patient has  had hypoglycemic episodes since the last clinic visit. The patient is symptomatic with these episodes, which occurs in the afternoon.  Patient follows with oncology for endometrial cancer Pt had a follow up with podiatry 05/09/2023  Denies nausea or vomiting  She had recent diarrhea that she attributes to eating a certain meal   She has been taking 2 tabs of Glipizide before Breakfast rather that separating it    HOME DIABETES REGIMEN:  Glipizide 5 mg , 1 tab BID    Statin: Yes ACE-I/ARB: no  METER DOWNLOAD SUMMARY: 90 day average 111 mg/dL    DIABETIC COMPLICATIONS: Microvascular complications:  CKD III Denies:  Last Eye Exam: Completed   Macrovascular complications:   Denies: CAD, CVA, PVD   HISTORY:  Past Medical History:  Past Medical History:  Diagnosis Date   Arthritis    right knee--- last cortisone injection 04/ 2019   CKD (chronic kidney disease)    Diabetes mellitus  without complication [facility])    endocrinologist-  dr Everardo All   Endometrial cancer [facility])    History of colon polyps    Hyperlipidemia    Hyperparathyroidism (HCC)    Hypertension    Past Surgical History:  Past Surgical History:  Procedure Laterality Date   AXILLARY LYMPH NODE BIOPSY Right 04/22/2022   Procedure: RIGHT AXILLARY LYMPH NODE BIOPSY;  Surgeon: Quentin Ore, MD;  Location: [facility] OR;  Service: General;  Laterality: Right;   COLONOSCOPY  last one 12-25-2017   IR IMAGING GUIDED PORT INSERTION  02/20/2018   ROBOTIC ASSISTED TOTAL HYSTERECTOMY WITH BILATERAL SALPINGO OOPHERECTOMY N/A 01/23/2018   Procedure: XI ROBOTIC ASSISTED TOTAL HYSTERECTOMY WITH BILATERAL SALPINGO OOPHORECTOMY;  Surgeon: Adolphus Birchwood, MD;  Location: [facility]S;  Service: Gynecology;  Laterality: N/A;   SENTINEL NODE BIOPSY N/A 01/23/2018   Procedure: SENTINEL NODE BIOPSY;  Surgeon: Adolphus Birchwood, MD;  Location: [facility];  Service: Gynecology;  Laterality: N/A;   Social History:  reports that she has never smoked. She has never used smokeless tobacco. She reports that she does not currently use alcohol. She reports that she does not use drugs. Family History:  Family History  Problem Relation Age of Onset   Diabetes Mother    Hypertension Mother    Diabetes Sister    Hypertension Sister    Diabetes Maternal Uncle    Colon cancer Paternal Aunt    Diabetes Paternal Aunt    Stomach cancer Neg Hx    Rectal cancer Neg Hx    Esophageal  cancer Neg Hx    Colon polyps Neg Hx      HOME MEDICATIONS: Allergies as of 06/13/2023       Reactions   Sulfa Antibiotics Nausea Only   Sulfamethoxazole Nausea Only        Medication List        Accurate as of June 13, 2023  9:10 AM. If you have any questions, ask your nurse or doctor.          amLODipine-valsartan 5-160 MG tablet Commonly known as: EXFORGE Take 1 tablet by mouth daily after breakfast.   aspirin EC 81 MG tablet Take 81 mg by mouth daily.  Swallow whole.   Azelastine HCl 0.15 % Soln Place 1-2 sprays into both nostrils daily as needed (sinus congestion/issues.).   calcium elemental as carbonate 400 MG chewable tablet Commonly known as: BARIATRIC TUMS ULTRA Chew 2 tablets by mouth daily in the afternoon.   diphenhydrAMINE 25 MG tablet Commonly known as: BENADRYL Take 25 mg by mouth daily as needed (runny nose/sinus issues.).   ergocalciferol 1.25 MG (50000 UT) capsule Commonly known as: VITAMIN D2 Take 1 capsule (50,000 Units total) by mouth once a week. What changed:  when to take this additional instructions   fexofenadine 180 MG tablet Commonly known as: ALLEGRA Take 180 mg by mouth daily as needed for allergies or rhinitis.   glipiZIDE 5 MG tablet Commonly known as: GLUCOTROL Take 1 tablet (5 mg total) by mouth 2 (two) times daily before a meal.   lenvatinib 10 mg daily dose capsule Commonly known as: Lenvima (10 MG Daily Dose) Take 1 capsule (10 mg total) by mouth daily.   levothyroxine 125 MCG tablet Commonly known as: Synthroid Take 1 tablet (125 mcg total) by mouth daily before breakfast.   lidocaine-prilocaine cream Commonly known as: EMLA Apply 1 Application topically daily as needed (prior to port access.).   OneTouch Delica Lancets 33G Misc 1 each by Other route 2 (two) times daily. Use to monitor glucose levels BID; E11.65   OneTouch Verio test strip Generic drug: glucose blood Use as instructed to check blood sugars 3 times a day.   ReliOn Pen Needles 32G X 4 MM Misc Generic drug: Insulin Pen Needle USE 1 PEN PER DAY   simvastatin 10 MG tablet Commonly known as: ZOCOR Take 10 mg by mouth at bedtime.   spironolactone 25 MG tablet Commonly known as: ALDACTONE Take 25 mg by mouth as needed.         OBJECTIVE:   Vital Signs: BP 134/82 (BP Location: Right Arm, Patient Position: Sitting, Cuff Size: Normal)   Pulse 88   Ht 5\' 6"  (1.676 m)   Wt 186 lb (84.4 kg)   SpO2 99%   BMI  30.02 kg/m   Wt Readings from Last 3 Encounters:  06/13/23 186 lb (84.4 kg)  05/19/23 187 lb (84.8 kg)  04/25/23 187 lb 3.2 oz (84.9 kg)     Exam: General: Pt appears well and is in NAD  Neck: General: Supple without adenopathy. Thyroid: Thyroid size normal.  No goiter or nodules appreciated.   Lungs: Clear with good BS bilat   Heart: RRR   Extremities: No  pretibial edema.   Neuro: MS is good with appropriate affect, pt is alert and Ox3    DM foot exam: 05/09/2023 per podiatry        DATA REVIEWED:  Lab Results  Component Value Date   HGBA1C 6.5 (A) 06/13/2023   HGBA1C 6.8  08/09/2022   HGBA1C 6.6 (H) 04/14/2022    Latest Reference Range & Units 05/19/23 08:25  Sodium 135 - 145 mmol/L 140  Potassium 3.5 - 5.1 mmol/L 3.8  Chloride 98 - 111 mmol/L 111  CO2 22 - 32 mmol/L 23  Glucose 70 - 99 mg/dL 132 (H)  BUN 8 - 23 mg/dL 29 (H)  Creatinine 4.40 - 1.00 mg/dL 1.02 (H)  Calcium 8.9 - 10.3 mg/dL 9.7  Anion gap 5 - 15  6  Alkaline Phosphatase 38 - 126 U/L 71  Albumin 3.5 - 5.0 g/dL 3.9  AST 15 - 41 U/L 19  ALT 0 - 44 U/L 16  Total Protein 6.5 - 8.1 g/dL 6.5  Total Bilirubin 0.0 - 1.2 mg/dL 0.6  GFR, Est Non African American >60 mL/min 42 (L)    Latest Reference Range & Units 05/19/23 08:25  TSH 0.350 - 4.500 uIU/mL 2.696  Thyroxine (T4) 4.5 - 12.0 ug/dL 8.4     Old records , labs and images have been reviewed.    ASSESSMENT / PLAN / RECOMMENDATIONS:   1) Type 2 Diabetes Mellitus, Optimally controlled, With CKD III complications - Most recent A1c of 6.5 %. Goal A1c < 7.0 %.     -A1c has optimized with switching from glipizide XL to glipizide twice daily dosing -The patient does endorse hypoglycemia, she has been self adjusting glipizide to taking 2 tablets in the morning rather than 1 tablet twice daily, I did recommend taking half a tablet of glipizide twice daily before breakfast and supper -Patient intolerant to Jardiance due to constipation, Marcelline Deist is  not covered -Patient is cost conscious and would like to stay on medications that are affordable   MEDICATIONS:  Decrease glipizide  5 mg, half tablet before breakfast have to tablet before supper  EDUCATION / INSTRUCTIONS: BG monitoring instructions: Patient is instructed to check her blood sugars 1 times a day. Call [facility] if: BG persistently < 70  I reviewed the Rule of 15 for the treatment of hypoglycemia in detail with the patient. Literature supplied.    2) Diabetic complications:  Eye: Does not have known diabetic retinopathy.  Neuro/ Feet: Does  have known diabetic peripheral neuropathy .  Renal: Patient does  have known baseline CKD. She   is  on an ACEI/ARB at present.     F/U in 6 months   Signed electronically by: Lyndle Herrlich, MD  [facility] Endocr[facility]ology  [facility] [address] [place], [place] [address] Phone: [phone] FAX: [phone]   CC: [name], MD [address] [place] [address] Phone: [phone]  Fax: [phone]  Return to Endocrinology clinic as below: Future Appointments  Date Time Provider Department Center  06/30/2023  8:00 AM [facility] FLUSH [facility] None  06/30/2023  8:20 AM Artis Delay, MD [facility] None  06/30/2023  9:15 AM [facility] INFUSION [facility] None  08/09/2023  9:00 AM Freddie Breech, DPM [facility]  11/07/2023  9:00 AM Freddie Breech, DPM [facility]

## 2023-06-13 ENCOUNTER — Encounter: Payer: Self-pay | Admitting: Internal Medicine

## 2023-06-13 ENCOUNTER — Ambulatory Visit (INDEPENDENT_AMBULATORY_CARE_PROVIDER_SITE_OTHER): Payer: Medicare Other | Admitting: Internal Medicine

## 2023-06-13 VITALS — BP 134/82 | HR 88 | Ht 66.0 in | Wt 186.0 lb

## 2023-06-13 DIAGNOSIS — N1831 Chronic kidney disease, stage 3a: Secondary | ICD-10-CM | POA: Diagnosis not present

## 2023-06-13 DIAGNOSIS — E1122 Type 2 diabetes mellitus with diabetic chronic kidney disease: Secondary | ICD-10-CM | POA: Diagnosis not present

## 2023-06-13 DIAGNOSIS — Z794 Long term (current) use of insulin: Secondary | ICD-10-CM

## 2023-06-13 LAB — POCT GLYCOSYLATED HEMOGLOBIN (HGB A1C): Hemoglobin A1C: 6.5 % — AB (ref 4.0–5.6)

## 2023-06-13 MED ORDER — GLIPIZIDE 5 MG PO TABS: 2.5000 mg | ORAL_TABLET | Freq: Two times a day (BID) | ORAL | 3 refills | Status: DC

## 2023-06-13 NOTE — Patient Instructions (Addendum)
 Change Glipizide  5 mg, Half a tablet before Breakfast and half a tablet before Supper    HOW TO TREAT LOW BLOOD SUGARS (Blood sugar LESS THAN 70 MG/DL) Please follow the RULE OF 15 for the treatment of hypoglycemia treatment (when your (blood sugars are less than 70 mg/dL)   STEP 1: Take 15 grams of carbohydrates when your blood sugar is low, which includes:  3-4 GLUCOSE TABS  OR 3-4 OZ OF JUICE OR REGULAR SODA OR ONE TUBE OF GLUCOSE GEL    STEP 2: RECHECK blood sugar in 15 MINUTES STEP 3: If your blood sugar is still low at the 15 minute recheck --> then, go back to STEP 1 and treat AGAIN with another 15 grams of carbohydrates.

## 2023-06-19 ENCOUNTER — Encounter: Payer: Self-pay | Admitting: Obstetrics and Gynecology

## 2023-06-30 ENCOUNTER — Inpatient Hospital Stay: Payer: Medicare Other | Attending: Gynecology

## 2023-06-30 ENCOUNTER — Inpatient Hospital Stay: Payer: Medicare Other

## 2023-06-30 ENCOUNTER — Encounter: Payer: Self-pay | Admitting: Hematology and Oncology

## 2023-06-30 ENCOUNTER — Inpatient Hospital Stay (HOSPITAL_BASED_OUTPATIENT_CLINIC_OR_DEPARTMENT_OTHER): Payer: Medicare Other | Admitting: Hematology and Oncology

## 2023-06-30 VITALS — BP 165/102 | HR 87 | Temp 97.4°F | Resp 18 | Ht 66.0 in | Wt 183.9 lb

## 2023-06-30 DIAGNOSIS — C541 Malignant neoplasm of endometrium: Secondary | ICD-10-CM

## 2023-06-30 DIAGNOSIS — Z7189 Other specified counseling: Secondary | ICD-10-CM

## 2023-06-30 DIAGNOSIS — E039 Hypothyroidism, unspecified: Secondary | ICD-10-CM | POA: Diagnosis not present

## 2023-06-30 DIAGNOSIS — I10 Essential (primary) hypertension: Secondary | ICD-10-CM | POA: Diagnosis not present

## 2023-06-30 DIAGNOSIS — Z5112 Encounter for antineoplastic immunotherapy: Secondary | ICD-10-CM | POA: Diagnosis not present

## 2023-06-30 DIAGNOSIS — C773 Secondary and unspecified malignant neoplasm of axilla and upper limb lymph nodes: Secondary | ICD-10-CM

## 2023-06-30 DIAGNOSIS — Z79620 Long term (current) use of immunosuppressive biologic: Secondary | ICD-10-CM | POA: Insufficient documentation

## 2023-06-30 LAB — CMP (CANCER CENTER ONLY)
ALT: 18 U/L (ref 0–44)
AST: 24 U/L (ref 15–41)
Albumin: 4 g/dL (ref 3.5–5.0)
Alkaline Phosphatase: 75 U/L (ref 38–126)
Anion gap: 9 (ref 5–15)
BUN: 19 mg/dL (ref 8–23)
CO2: 22 mmol/L (ref 22–32)
Calcium: 8.8 mg/dL — ABNORMAL LOW (ref 8.9–10.3)
Chloride: 110 mmol/L (ref 98–111)
Creatinine: 1.2 mg/dL — ABNORMAL HIGH (ref 0.44–1.00)
GFR, Estimated: 48 mL/min — ABNORMAL LOW (ref >60)
Glucose, Bld: 124 mg/dL — ABNORMAL HIGH (ref 70–99)
Potassium: 3.1 mmol/L — ABNORMAL LOW (ref 3.5–5.1)
Sodium: 141 mmol/L (ref 135–145)
Total Bilirubin: 0.6 mg/dL (ref 0.0–1.2)
Total Protein: 6.7 g/dL (ref 6.5–8.1)

## 2023-06-30 LAB — CBC WITH DIFFERENTIAL (CANCER CENTER ONLY)
Abs Immature Granulocytes: 0.03 10*3/uL (ref 0.00–0.07)
Basophils Absolute: 0 10*3/uL (ref 0.0–0.1)
Basophils Relative: 0 %
Eosinophils Absolute: 0.1 10*3/uL (ref 0.0–0.5)
Eosinophils Relative: 1 %
HCT: 37.8 % (ref 36.0–46.0)
Hemoglobin: 12.5 g/dL (ref 12.0–15.0)
Immature Granulocytes: 1 %
Lymphocytes Relative: 32 %
Lymphs Abs: 1.4 10*3/uL (ref 0.7–4.0)
MCH: 29 pg (ref 26.0–34.0)
MCHC: 33.1 g/dL (ref 30.0–36.0)
MCV: 87.7 fL (ref 80.0–100.0)
Monocytes Absolute: 0.3 10*3/uL (ref 0.1–1.0)
Monocytes Relative: 6 %
Neutro Abs: 2.6 10*3/uL (ref 1.7–7.7)
Neutrophils Relative %: 60 %
Platelet Count: 192 10*3/uL (ref 150–400)
RBC: 4.31 MIL/uL (ref 3.87–5.11)
RDW: 13.4 % (ref 11.5–15.5)
WBC Count: 4.3 10*3/uL (ref 4.0–10.5)
nRBC: 0 % (ref 0.0–0.2)

## 2023-06-30 LAB — TOTAL PROTEIN, URINE DIPSTICK: Protein, ur: NEGATIVE mg/dL

## 2023-06-30 LAB — TSH: TSH: 11.388 u[IU]/mL — ABNORMAL HIGH (ref 0.350–4.500)

## 2023-06-30 MED ORDER — SODIUM CHLORIDE 0.9% FLUSH
10.0000 mL | Freq: Once | INTRAVENOUS | Status: AC
  Administered 2023-06-30: 10 mL

## 2023-06-30 MED ORDER — SODIUM CHLORIDE 0.9% FLUSH
10.0000 mL | INTRAVENOUS | Status: DC | PRN
  Administered 2023-06-30: 10 mL

## 2023-06-30 MED ORDER — HEPARIN SOD (PORK) LOCK FLUSH 100 UNIT/ML IV SOLN
500.0000 [IU] | Freq: Once | INTRAVENOUS | Status: AC | PRN
  Administered 2023-06-30: 500 [IU]

## 2023-06-30 MED ORDER — SODIUM CHLORIDE 0.9 % IV SOLN
Freq: Once | INTRAVENOUS | Status: AC
Start: 2023-06-30 — End: 2023-06-30

## 2023-06-30 MED ORDER — SODIUM CHLORIDE 0.9 % IV SOLN
400.0000 mg | Freq: Once | INTRAVENOUS | Status: AC
  Administered 2023-06-30: 400 mg via INTRAVENOUS
  Filled 2023-06-30: qty 16

## 2023-06-30 NOTE — Patient Instructions (Signed)
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
## 2023-06-30 NOTE — Assessment & Plan Note (Addendum)
 Her blood pressure is profoundly elevated today due to pain Her documented blood pressure from home were within normal range She will continue current antihypertensives

## 2023-06-30 NOTE — Assessment & Plan Note (Addendum)
 Her TSH is intermittently elevated I will increase the dose of her Synthroid

## 2023-06-30 NOTE — Assessment & Plan Note (Addendum)
 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

## 2023-07-01 LAB — T4: T4, Total: 8.1 ug/dL (ref 4.5–12.0)

## 2023-07-03 ENCOUNTER — Telehealth: Payer: Self-pay

## 2023-07-03 ENCOUNTER — Other Ambulatory Visit: Payer: Self-pay

## 2023-07-03 DIAGNOSIS — E113293 Type 2 diabetes mellitus with mild nonproliferative diabetic retinopathy without macular edema, bilateral: Secondary | ICD-10-CM | POA: Diagnosis not present

## 2023-07-03 DIAGNOSIS — H2513 Age-related nuclear cataract, bilateral: Secondary | ICD-10-CM | POA: Diagnosis not present

## 2023-07-03 MED ORDER — LEVOTHYROXINE SODIUM 150 MCG PO TABS: 150.0000 ug | ORAL_TABLET | Freq: Every day | ORAL | 1 refills | Status: DC

## 2023-07-03 NOTE — Telephone Encounter (Signed)
-----   Message from Artis Delay sent at 06/30/2023  2:45 PM EDT ----- TSH is high, increase her syntroid to 150 mcg, 60 tabs with 1 refill to her pharmacy

## 2023-07-03 NOTE — Telephone Encounter (Signed)
 Called and left below message and ask her to call the office for questions. Sent Rx to her preferred pharmacy.

## 2023-07-07 DIAGNOSIS — M791 Myalgia, unspecified site: Secondary | ICD-10-CM | POA: Diagnosis not present

## 2023-07-07 DIAGNOSIS — M2550 Pain in unspecified joint: Secondary | ICD-10-CM | POA: Diagnosis not present

## 2023-08-09 ENCOUNTER — Encounter: Payer: Self-pay | Admitting: Podiatry

## 2023-08-09 ENCOUNTER — Ambulatory Visit (INDEPENDENT_AMBULATORY_CARE_PROVIDER_SITE_OTHER): Payer: Medicare Other | Admitting: Podiatry

## 2023-08-09 DIAGNOSIS — Z794 Long term (current) use of insulin: Secondary | ICD-10-CM | POA: Diagnosis not present

## 2023-08-09 DIAGNOSIS — B351 Tinea unguium: Secondary | ICD-10-CM

## 2023-08-09 DIAGNOSIS — E0822 Diabetes mellitus due to underlying condition with diabetic chronic kidney disease: Secondary | ICD-10-CM | POA: Diagnosis not present

## 2023-08-09 DIAGNOSIS — G620 Drug-induced polyneuropathy: Secondary | ICD-10-CM

## 2023-08-09 DIAGNOSIS — M79674 Pain in right toe(s): Secondary | ICD-10-CM | POA: Diagnosis not present

## 2023-08-09 DIAGNOSIS — M79675 Pain in left toe(s): Secondary | ICD-10-CM | POA: Diagnosis not present

## 2023-08-09 DIAGNOSIS — L84 Corns and callosities: Secondary | ICD-10-CM | POA: Diagnosis not present

## 2023-08-09 DIAGNOSIS — N183 Chronic kidney disease, stage 3 unspecified: Secondary | ICD-10-CM | POA: Diagnosis not present

## 2023-08-10 ENCOUNTER — Other Ambulatory Visit: Payer: Self-pay

## 2023-08-10 NOTE — Assessment & Plan Note (Signed)
 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%), MSI stable, ER 80%, PR 60%, Her2/neu neg, PD-L1 CPS 15%  She has disease relapse after completion of chemotherapy and was switched to pembrolizumab  and lenvatinib  with complete response, discontinued in 2023 She had biopsy proven disease relapse in 2024 and treatment was restarted again in October 2024  Overall, she tolerated treatment very well without major side effects apart from intermittent hypertension Imaging study from January 2025 show positive response to therapy We will proceed with treatment without delay I plan to repeat imaging study next month Due to hypertension that could be due to Lenvima , I will start her on additional antihypertensives

## 2023-08-11 ENCOUNTER — Inpatient Hospital Stay

## 2023-08-11 ENCOUNTER — Encounter: Payer: Self-pay | Admitting: Hematology and Oncology

## 2023-08-11 ENCOUNTER — Other Ambulatory Visit: Payer: Self-pay

## 2023-08-11 ENCOUNTER — Telehealth: Payer: Self-pay

## 2023-08-11 ENCOUNTER — Inpatient Hospital Stay: Attending: Gynecology

## 2023-08-11 ENCOUNTER — Inpatient Hospital Stay (HOSPITAL_BASED_OUTPATIENT_CLINIC_OR_DEPARTMENT_OTHER): Admitting: Hematology and Oncology

## 2023-08-11 VITALS — BP 139/91 | HR 84 | Resp 16

## 2023-08-11 VITALS — BP 155/90 | HR 90 | Temp 97.8°F | Resp 18 | Ht 66.0 in | Wt 185.6 lb

## 2023-08-11 DIAGNOSIS — Z79620 Long term (current) use of immunosuppressive biologic: Secondary | ICD-10-CM | POA: Diagnosis not present

## 2023-08-11 DIAGNOSIS — C541 Malignant neoplasm of endometrium: Secondary | ICD-10-CM | POA: Insufficient documentation

## 2023-08-11 DIAGNOSIS — Z923 Personal history of irradiation: Secondary | ICD-10-CM | POA: Diagnosis not present

## 2023-08-11 DIAGNOSIS — N183 Chronic kidney disease, stage 3 unspecified: Secondary | ICD-10-CM | POA: Diagnosis not present

## 2023-08-11 DIAGNOSIS — I10 Essential (primary) hypertension: Secondary | ICD-10-CM

## 2023-08-11 DIAGNOSIS — I129 Hypertensive chronic kidney disease with stage 1 through stage 4 chronic kidney disease, or unspecified chronic kidney disease: Secondary | ICD-10-CM | POA: Diagnosis not present

## 2023-08-11 DIAGNOSIS — Z5112 Encounter for antineoplastic immunotherapy: Secondary | ICD-10-CM | POA: Diagnosis not present

## 2023-08-11 DIAGNOSIS — C773 Secondary and unspecified malignant neoplasm of axilla and upper limb lymph nodes: Secondary | ICD-10-CM

## 2023-08-11 DIAGNOSIS — Z7189 Other specified counseling: Secondary | ICD-10-CM

## 2023-08-11 LAB — CBC WITH DIFFERENTIAL (CANCER CENTER ONLY)
Abs Immature Granulocytes: 0.02 10*3/uL (ref 0.00–0.07)
Basophils Absolute: 0 10*3/uL (ref 0.0–0.1)
Basophils Relative: 0 %
Eosinophils Absolute: 0 10*3/uL (ref 0.0–0.5)
Eosinophils Relative: 1 %
HCT: 37.7 % (ref 36.0–46.0)
Hemoglobin: 12.4 g/dL (ref 12.0–15.0)
Immature Granulocytes: 0 %
Lymphocytes Relative: 28 %
Lymphs Abs: 1.4 10*3/uL (ref 0.7–4.0)
MCH: 28.4 pg (ref 26.0–34.0)
MCHC: 32.9 g/dL (ref 30.0–36.0)
MCV: 86.5 fL (ref 80.0–100.0)
Monocytes Absolute: 0.3 10*3/uL (ref 0.1–1.0)
Monocytes Relative: 6 %
Neutro Abs: 3.3 10*3/uL (ref 1.7–7.7)
Neutrophils Relative %: 65 %
Platelet Count: 179 10*3/uL (ref 150–400)
RBC: 4.36 MIL/uL (ref 3.87–5.11)
RDW: 14.3 % (ref 11.5–15.5)
WBC Count: 5 10*3/uL (ref 4.0–10.5)
nRBC: 0 % (ref 0.0–0.2)

## 2023-08-11 LAB — CMP (CANCER CENTER ONLY)
ALT: 22 U/L (ref 0–44)
AST: 22 U/L (ref 15–41)
Albumin: 4.1 g/dL (ref 3.5–5.0)
Alkaline Phosphatase: 65 U/L (ref 38–126)
Anion gap: 6 (ref 5–15)
BUN: 26 mg/dL — ABNORMAL HIGH (ref 8–23)
CO2: 21 mmol/L — ABNORMAL LOW (ref 22–32)
Calcium: 8.6 mg/dL — ABNORMAL LOW (ref 8.9–10.3)
Chloride: 116 mmol/L — ABNORMAL HIGH (ref 98–111)
Creatinine: 1.3 mg/dL — ABNORMAL HIGH (ref 0.44–1.00)
GFR, Estimated: 44 mL/min — ABNORMAL LOW (ref >60)
Glucose, Bld: 136 mg/dL — ABNORMAL HIGH (ref 70–99)
Potassium: 3.3 mmol/L — ABNORMAL LOW (ref 3.5–5.1)
Sodium: 143 mmol/L (ref 135–145)
Total Bilirubin: 0.5 mg/dL (ref 0.0–1.2)
Total Protein: 6.6 g/dL (ref 6.5–8.1)

## 2023-08-11 LAB — TOTAL PROTEIN, URINE DIPSTICK: Protein, ur: 100 mg/dL — AB

## 2023-08-11 LAB — TSH: TSH: 10.1 u[IU]/mL — ABNORMAL HIGH (ref 0.350–4.500)

## 2023-08-11 MED ORDER — SODIUM CHLORIDE 0.9% FLUSH
10.0000 mL | Freq: Once | INTRAVENOUS | Status: AC
  Administered 2023-08-11: 10 mL

## 2023-08-11 MED ORDER — LEVOTHYROXINE SODIUM 175 MCG PO TABS: 175.0000 ug | ORAL_TABLET | Freq: Every day | ORAL | 2 refills | Status: DC

## 2023-08-11 MED ORDER — PEMBROLIZUMAB CHEMO INJECTION 100 MG/4ML
400.0000 mg | Freq: Once | INTRAVENOUS | Status: AC
  Administered 2023-08-11: 400 mg via INTRAVENOUS
  Filled 2023-08-11: qty 16

## 2023-08-11 MED ORDER — ATENOLOL 25 MG PO TABS: 25.0000 mg | ORAL_TABLET | Freq: Every day | ORAL | 3 refills | Status: DC

## 2023-08-11 MED ORDER — SODIUM CHLORIDE 0.9 % IV SOLN: Freq: Once | INTRAVENOUS | Status: AC

## 2023-08-11 MED ORDER — HEPARIN SOD (PORK) LOCK FLUSH 100 UNIT/ML IV SOLN
500.0000 [IU] | Freq: Once | INTRAVENOUS | Status: AC | PRN
  Administered 2023-08-11: 500 [IU]

## 2023-08-11 MED ORDER — SODIUM CHLORIDE 0.9% FLUSH
10.0000 mL | INTRAVENOUS | Status: DC | PRN
  Administered 2023-08-11: 10 mL

## 2023-08-11 NOTE — Assessment & Plan Note (Addendum)
 She has stable chronic kidney disease stage III Advised the patient to increase oral fluid intake and monitor blood pressure carefully

## 2023-08-11 NOTE — Assessment & Plan Note (Addendum)
 Blood pressure is suboptimally controlled, could be due to Lenvima  I recommend addition of beta-blocker once daily in addition to her current prescribed antihypertensives

## 2023-08-11 NOTE — Telephone Encounter (Signed)
Called and given below message. She verbalized understanding. Rx sent to preferred pharmacy. 

## 2023-08-11 NOTE — Progress Notes (Signed)
 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%), MSI stable, ER 80%, PR 60%, Her2/neu neg, PD-L1 CPS 15%  She has disease relapse after completion of chemotherapy and was switched to pembrolizumab  and lenvatinib  with complete response, discontinued in 2023 She had biopsy proven disease relapse in 2024 and treatment was restarted again in October 2024  Overall, she tolerated treatment very well without major side effects apart from intermittent hypertension Imaging study from January 2025 show positive response to therapy We will proceed with treatment without delay I plan to repeat imaging study next month Due to hypertension that could be due to Lenvima , I will start her on additional antihypertensives Goals of care, counseling/discussion  Essential hypertension Blood pressure is suboptimally controlled, could be due to Lenvima  I recommend addition of beta-blocker once daily in addition to her current prescribed antihypertensives Stage 3 chronic kidney disease, unspecified whether stage 3a or 3b CKD (HCC) She has stable chronic kidney disease stage III Advised the patient to increase oral fluid intake and monitor blood pressure carefully  Orders Placed This Encounter  Procedures   CT ABDOMEN PELVIS W CONTRAST    Standing Status:   Future    Expected Date:   09/15/2023    Expiration Date:   08/10/2024    Scheduling Instructions:     No oral contrast    If indicated for the ordered procedure, I authorize the administration of contrast media per Radiology protocol:   Yes    Does the patient have a contrast  media/X-ray dye allergy?:   No    Preferred imaging location?:   [facility]    If indicated for the ordered procedure, I authorize the administration of oral contrast media per Radiology protocol:   No    Reason for no oral contrast::   No need oral contrast   Total Protein, Urine dipstick    Standing Status:   Future    Expected Date:   11/03/2023    Expiration Date:   11/02/2024   CBC with Differential (Cancer Center Only)    Standing Status:   Future    Expected Date:   11/03/2023    Expiration Date:   11/02/2024   CMP (Cancer Center only)    Standing Status:   Future    Expected Date:   11/03/2023    Expiration Date:   11/02/2024   T4    Standing Status:   Future    Expected Date:   11/03/2023    Expiration Date:   11/02/2024   TSH    Standing Status:   Future    Expected Date:   11/03/2023    Expiration Date:   11/02/2024     Almeda Jacobs, MD  INTERVAL HISTORY: she returns for treatment follow-up Complications related to previous cycle of chemotherapy included bone aches, and elevated BP Since last time I saw her, she has noted her blood pressure has been running up and down She saw a chiropractor recently due to muscular strain but is improving We discussed test results Urine protein come back high and I recommend additional blood pressure medication while she is on treatment We discussed timing of  her next imaging  PHYSICAL EXAMINATION: ECOG PERFORMANCE STATUS: 1 - Symptomatic but completely ambulatory  Vitals:   08/11/23 0822  BP: (!) 155/90  Pulse: 90  Resp: 18  Temp: 97.8 F (36.6 C)  SpO2: 100%   Filed Weights   08/11/23 0822  Weight: 185 lb 9.6 oz (84.2 kg)    Relevant data reviewed during this visit included CBC, CMP and urine protein

## 2023-08-11 NOTE — Telephone Encounter (Signed)
-----   Message from Almeda Jacobs sent at 08/11/2023  2:13 PM EDT ----- TSH is high Please increase synthroid  to 175 mcg and call in rpesxcription

## 2023-08-11 NOTE — Patient Instructions (Signed)

## 2023-08-12 LAB — T4: T4, Total: 7.7 ug/dL (ref 4.5–12.0)

## 2023-08-13 NOTE — Progress Notes (Signed)
 Subjective:  Patient ID: Krista Johnson, female    DOB: 1951/01/11,  MRN: [id number]  Krista Johnson presents to clinic today for at risk foot care with h/o neuropathy secondary to chemotherapy and callus(es) of both feet and painful mycotic toenails that are difficult to trim. Painful toenails interfere with ambulation. Aggravating factors include wearing enclosed shoe gear. Pain is relieved with periodic professional debridement. Painful calluses are aggravated when weightbearing with and without shoegear. Pain is relieved with periodic professional debridement.  Chief Complaint  Patient presents with   Diabetes    Patient A1c is 6.5 and she knows her PCP and last saw her PCP in October last year her PCP name is Nyota Brummell   New problem(s): None.   PCP is Olin Bertin, MD.  Allergies  Allergen Reactions   Sulfa Antibiotics Nausea Only   Sulfamethoxazole Nausea Only    Review of Systems: Negative except as noted in the HPI.  Objective: No changes noted in today's physical examination. There were no vitals filed for this visit. Krista Johnson is a pleasant 73 y.o. female WD, WN in NAD. AAO x 3.  Vascular Examination: Capillary refill time immediate b/l. Vascular status intact b/l with palpable pedal pulses. Pedal hair present b/l. No pain with calf compression b/l. Skin temperature gradient WNL b/l. No cyanosis or clubbing b/l. No ischemia or gangrene noted b/l.   Neurological Examination: Sensation grossly intact b/l with 10 gram monofilament. Pt has subjective symptoms of neuropathy.  Dermatological Examination: Pedal skin with normal turgor, texture and tone b/l.  No open wounds. No interdigital macerations.   Toenails 1-5 b/l thick, discolored, elongated with subungual debris and pain on dorsal palpation.   Hyperkeratotic lesion(s) left heel, L 5th toe, and sub 5th met base right foot.  No erythema, no edema, no drainage, no fluctuance.  Musculoskeletal  Examination: Muscle strength 5/5 to all lower extremity muscle groups bilaterally. Hammertoe deformity noted 2-5 b/l.  Radiographs: None  Last A1c:      Latest Ref Rng & Units 06/13/2023    9:02 AM  Hemoglobin A1C  Hemoglobin-A1c 4.0 - 5.6 % 6.5     Assessment/Plan: 1. Pain due to onychomycosis of toenails of both feet   2. Corns and callosities   3. Chemotherapy-induced neuropathy (HCC)   4. 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 Patient examined. All patient's and/or POA's questions/concerns addressed on today's visit. Toenails 1-5 debrided in length and girth without incident. Corn(s) and callus(es) left heel, L 5th toe, and 5th met base right foot pared with sharp debridement without incident. Continue foot and shoe inspections daily. Monitor blood glucose per PCP/Endocrinologist's recommendations.Continue soft, supportive shoe gear daily. Report any pedal injuries to medical professional. Call office if there are any questions/concerns. -Patient/POA to call should there be question/concern in the interim.   Return in about 3 months (around 11/08/2023).  Krista Johnson, DPM      [facility] LOCATION: [address], [place] [address]  Office [phone]   [facility] LOCATION: [address] [place], [place] [address] Office [phone]

## 2023-08-16 DIAGNOSIS — G8929 Other chronic pain: Secondary | ICD-10-CM | POA: Diagnosis not present

## 2023-08-16 DIAGNOSIS — M25561 Pain in right knee: Secondary | ICD-10-CM | POA: Diagnosis not present

## 2023-08-22 ENCOUNTER — Telehealth: Payer: Self-pay

## 2023-08-22 NOTE — Telephone Encounter (Signed)
 Patient states that she received a steroid injection last Wednesday and her sugars have been running higher that normal. Her normal fasting numbers are 98-107 but have been running in the 150 range.  She been doing 2.5mg  of Glipizide  breakfast and supper. She would like to know if she should be taking more medication.  This morning her BS was 137 fasting

## 2023-08-23 NOTE — Telephone Encounter (Signed)
Patient notified and will make medication change

## 2023-08-28 DIAGNOSIS — E119 Type 2 diabetes mellitus without complications: Secondary | ICD-10-CM | POA: Diagnosis not present

## 2023-08-28 DIAGNOSIS — H2512 Age-related nuclear cataract, left eye: Secondary | ICD-10-CM | POA: Diagnosis not present

## 2023-08-28 DIAGNOSIS — H2513 Age-related nuclear cataract, bilateral: Secondary | ICD-10-CM | POA: Diagnosis not present

## 2023-08-28 DIAGNOSIS — I10 Essential (primary) hypertension: Secondary | ICD-10-CM | POA: Diagnosis not present

## 2023-09-15 ENCOUNTER — Ambulatory Visit (HOSPITAL_COMMUNITY)
Admission: RE | Admit: 2023-09-15 | Discharge: 2023-09-15 | Disposition: A | Discharge status: Home / Self Care | Source: Ambulatory Visit | Attending: Hematology and Oncology | Admitting: Hematology and Oncology

## 2023-09-15 ENCOUNTER — Other Ambulatory Visit (HOSPITAL_COMMUNITY)

## 2023-09-15 DIAGNOSIS — C541 Malignant neoplasm of endometrium: Secondary | ICD-10-CM | POA: Insufficient documentation

## 2023-09-15 LAB — POCT I-STAT CREATININE: Creatinine, Ser: 1.3 mg/dL — ABNORMAL HIGH (ref 0.44–1.00)

## 2023-09-15 MED ORDER — SODIUM CHLORIDE (PF) 0.9 % IJ SOLN
INTRAMUSCULAR | Status: AC
  Filled 2023-09-15: qty 50

## 2023-09-15 MED ORDER — HEPARIN SOD (PORK) LOCK FLUSH 100 UNIT/ML IV SOLN
500.0000 [IU] | Freq: Once | INTRAVENOUS | Status: AC
  Administered 2023-09-15: 500 [IU] via INTRAVENOUS

## 2023-09-15 MED ORDER — IOHEXOL 300 MG/ML  SOLN
100.0000 mL | Freq: Once | INTRAMUSCULAR | Status: AC | PRN
  Administered 2023-09-15: 80 mL via INTRAVENOUS

## 2023-09-22 ENCOUNTER — Inpatient Hospital Stay (HOSPITAL_BASED_OUTPATIENT_CLINIC_OR_DEPARTMENT_OTHER): Admitting: Hematology and Oncology

## 2023-09-22 ENCOUNTER — Inpatient Hospital Stay

## 2023-09-22 ENCOUNTER — Encounter: Payer: Self-pay | Admitting: Hematology and Oncology

## 2023-09-22 ENCOUNTER — Other Ambulatory Visit: Payer: Self-pay

## 2023-09-22 ENCOUNTER — Inpatient Hospital Stay: Attending: Gynecology

## 2023-09-22 ENCOUNTER — Ambulatory Visit: Payer: Self-pay | Admitting: Hematology and Oncology

## 2023-09-22 VITALS — BP 166/111 | HR 65 | Temp 97.6°F | Resp 17 | Wt 179.4 lb

## 2023-09-22 VITALS — BP 176/90

## 2023-09-22 DIAGNOSIS — I10 Essential (primary) hypertension: Secondary | ICD-10-CM

## 2023-09-22 DIAGNOSIS — E039 Hypothyroidism, unspecified: Secondary | ICD-10-CM | POA: Insufficient documentation

## 2023-09-22 DIAGNOSIS — Z79620 Long term (current) use of immunosuppressive biologic: Secondary | ICD-10-CM | POA: Insufficient documentation

## 2023-09-22 DIAGNOSIS — C541 Malignant neoplasm of endometrium: Secondary | ICD-10-CM | POA: Diagnosis not present

## 2023-09-22 DIAGNOSIS — Z5112 Encounter for antineoplastic immunotherapy: Secondary | ICD-10-CM | POA: Insufficient documentation

## 2023-09-22 DIAGNOSIS — Z7189 Other specified counseling: Secondary | ICD-10-CM

## 2023-09-22 DIAGNOSIS — Z923 Personal history of irradiation: Secondary | ICD-10-CM | POA: Insufficient documentation

## 2023-09-22 DIAGNOSIS — C773 Secondary and unspecified malignant neoplasm of axilla and upper limb lymph nodes: Secondary | ICD-10-CM

## 2023-09-22 LAB — CBC WITH DIFFERENTIAL (CANCER CENTER ONLY)
Abs Immature Granulocytes: 0.04 10*3/uL (ref 0.00–0.07)
Basophils Absolute: 0 10*3/uL (ref 0.0–0.1)
Basophils Relative: 0 %
Eosinophils Absolute: 0 10*3/uL (ref 0.0–0.5)
Eosinophils Relative: 1 %
HCT: 38 % (ref 36.0–46.0)
Hemoglobin: 12.6 g/dL (ref 12.0–15.0)
Immature Granulocytes: 1 %
Lymphocytes Relative: 27 %
Lymphs Abs: 1.4 10*3/uL (ref 0.7–4.0)
MCH: 28.9 pg (ref 26.0–34.0)
MCHC: 33.2 g/dL (ref 30.0–36.0)
MCV: 87.2 fL (ref 80.0–100.0)
Monocytes Absolute: 0.2 10*3/uL (ref 0.1–1.0)
Monocytes Relative: 5 %
Neutro Abs: 3.5 10*3/uL (ref 1.7–7.7)
Neutrophils Relative %: 66 %
Platelet Count: 172 10*3/uL (ref 150–400)
RBC: 4.36 MIL/uL (ref 3.87–5.11)
RDW: 14 % (ref 11.5–15.5)
WBC Count: 5.3 10*3/uL (ref 4.0–10.5)
nRBC: 0 % (ref 0.0–0.2)

## 2023-09-22 LAB — CMP (CANCER CENTER ONLY)
ALT: 17 U/L (ref 0–44)
AST: 18 U/L (ref 15–41)
Albumin: 3.9 g/dL (ref 3.5–5.0)
Alkaline Phosphatase: 64 U/L (ref 38–126)
Anion gap: 9 (ref 5–15)
BUN: 21 mg/dL (ref 8–23)
CO2: 23 mmol/L (ref 22–32)
Calcium: 9 mg/dL (ref 8.9–10.3)
Chloride: 112 mmol/L — ABNORMAL HIGH (ref 98–111)
Creatinine: 1.27 mg/dL — ABNORMAL HIGH (ref 0.44–1.00)
GFR, Estimated: 45 mL/min — ABNORMAL LOW (ref >60)
Glucose, Bld: 136 mg/dL — ABNORMAL HIGH (ref 70–99)
Potassium: 3.6 mmol/L (ref 3.5–5.1)
Sodium: 144 mmol/L (ref 135–145)
Total Bilirubin: 0.6 mg/dL (ref 0.0–1.2)
Total Protein: 6.5 g/dL (ref 6.5–8.1)

## 2023-09-22 LAB — TOTAL PROTEIN, URINE DIPSTICK: Protein, ur: 100 mg/dL — AB

## 2023-09-22 LAB — TSH: TSH: 17.1 u[IU]/mL — ABNORMAL HIGH (ref 0.350–4.500)

## 2023-09-22 MED ORDER — SODIUM CHLORIDE 0.9% FLUSH
10.0000 mL | INTRAVENOUS | Status: DC | PRN
  Administered 2023-09-22: 10 mL

## 2023-09-22 MED ORDER — HEPARIN SOD (PORK) LOCK FLUSH 100 UNIT/ML IV SOLN
500.0000 [IU] | Freq: Once | INTRAVENOUS | Status: AC | PRN
  Administered 2023-09-22: 500 [IU]

## 2023-09-22 MED ORDER — SODIUM CHLORIDE 0.9% FLUSH
10.0000 mL | Freq: Once | INTRAVENOUS | Status: AC
  Administered 2023-09-22: 10 mL

## 2023-09-22 MED ORDER — SODIUM CHLORIDE 0.9 % IV SOLN: Freq: Once | INTRAVENOUS | Status: AC

## 2023-09-22 MED ORDER — SODIUM CHLORIDE 0.9 % IV SOLN
400.0000 mg | Freq: Once | INTRAVENOUS | Status: AC
  Administered 2023-09-22: 400 mg via INTRAVENOUS
  Filled 2023-09-22: qty 16

## 2023-09-22 MED ORDER — ATENOLOL 25 MG PO TABS: 25.0000 mg | ORAL_TABLET | Freq: Two times a day (BID) | ORAL | 3 refills | Status: DC

## 2023-09-22 MED ORDER — LENVATINIB (10 MG DAILY DOSE) 10 MG PO CPPK: 10.0000 mg | ORAL_CAPSULE | Freq: Every day | ORAL | Status: DC

## 2023-09-22 MED ORDER — LEVOTHYROXINE SODIUM 200 MCG PO TABS: 200.0000 ug | ORAL_TABLET | Freq: Every day | ORAL | 2 refills | Status: DC

## 2023-09-22 MED ORDER — ALTEPLASE 2 MG IJ SOLR
2.0000 mg | Freq: Once | INTRAMUSCULAR | Status: AC
  Administered 2023-09-22: 2 mg
  Filled 2023-09-22: qty 2

## 2023-09-22 NOTE — Telephone Encounter (Signed)
-----   Message from Almeda Jacobs sent at 09/22/2023  2:12 PM EDT ----- TSH is very high, side effects of pembrolizumab . I recommend increased Synthroid  to 200 mcg

## 2023-09-22 NOTE — Assessment & Plan Note (Addendum)
 Her TSH is intermittently elevated I will increase the dose of her Synthroid

## 2023-09-22 NOTE — Progress Notes (Signed)
 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%), MSI stable, ER 80%, PR 60%, Her2/neu neg, PD-L1 CPS 15%  She has disease relapse after completion of chemotherapy and was switched to pembrolizumab  and lenvatinib  with complete response, discontinued in 2023 She had biopsy proven disease relapse in 2024 and treatment was restarted again in October 2024  Overall, she tolerated treatment very well without major side effects except for poorly controlled hypertension and abnormal thyroid  function Imaging study from January 2025 and May 2025 showed positive response to therapy Unfortunately, she has proteinuria and uncontrolled hypertension likely due to side effects of Lenvima  Lenvima  moved placed on hold She will proceed with pembrolizumab  only today Goals of care, counseling/discussion  Essential hypertension Her blood pressure is poorly controlled We will hold Lenvima  Plan to increase the dose of atenolol  and reassess in her next visit Acquired hypothyroidism Her TSH is intermittently elevated I will increase the dose of her Synthroid   Orders Placed This Encounter  Procedures   Total Protein, Urine dipstick    Standing Status:   Future    Expected Date:   12/15/2023    Expiration Date:   12/14/2024   CBC with Differential (Cancer Center Only)    Standing Status:   Future    Expected Date:   12/15/2023    Expiration Date:   12/14/2024   CMP (Cancer Center only)    Standing Status:   Future    Expected Date:   12/15/2023    Expiration Date:   12/14/2024   T4    Standing Status:   Future    Expected Date:    12/15/2023    Expiration Date:   12/14/2024   TSH    Standing Status:   Future    Expected Date:   12/15/2023    Expiration Date:   12/14/2024     Almeda Jacobs, MD  INTERVAL HISTORY: she returns for treatment follow-up Complications related to previous cycle of chemotherapy included anemia,, abnormal thyroid  function, and elevated BP Her blood pressure at home is borderline elevated She is not symptomatic She has developed signs of proteinuria We discussed the rationale behind with holding Lenvima  I reviewed imaging study with the patient PHYSICAL EXAMINATION: ECOG PERFORMANCE STATUS: 1 - Symptomatic but completely ambulatory  No results found for: "CAN125"    Latest Ref Rng & Units 09/22/2023    8:21 AM 08/11/2023    7:35 AM 06/30/2023    8:06 AM  CBC  WBC 4.0 - 10.5 K/uL 5.3  5.0  4.3   Hemoglobin 12.0 - 15.0 g/dL 29.5  62.1  30.8   Hematocrit 36.0 - 46.0 % 38.0  37.7  37.8   Platelets 150 - 400 K/uL 172  179  192       Chemistry      Component Value Date/Time   NA 144 09/22/2023 0821   K 3.6 09/22/2023 0821   CL 112 (H) 09/22/2023 0821   CO2 23 09/22/2023 0821   BUN 21 09/22/2023 0821   CREATININE 1.27 (H) 09/22/2023 0821      Component Value Date/Time   CALCIUM 9.0 09/22/2023 0821   ALKPHOS 64 09/22/2023 0821  AST 18 09/22/2023 0821   ALT 17 09/22/2023 0821   BILITOT 0.6 09/22/2023 0821       Vitals:   09/22/23 0848 09/22/23 0849  BP: (!) 196/107 (!) 166/111  Pulse: 65   Resp: 17   Temp: 97.6 F (36.4 C)   SpO2: 100%    Filed Weights   09/22/23 0848  Weight: 179 lb 6.4 oz (81.4 kg)   Other relevant data reviewed during this visit included CBC, CMP, TSH, CT imaging from May 2025

## 2023-09-22 NOTE — Assessment & Plan Note (Addendum)
 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%), MSI stable, ER 80%, PR 60%, Her2/neu neg, PD-L1 CPS 15%  She has disease relapse after completion of chemotherapy and was switched to pembrolizumab  and lenvatinib  with complete response, discontinued in 2023 She had biopsy proven disease relapse in 2024 and treatment was restarted again in October 2024  Overall, she tolerated treatment very well without major side effects except for poorly controlled hypertension and abnormal thyroid  function Imaging study from January 2025 and May 2025 showed positive response to therapy Unfortunately, she has proteinuria and uncontrolled hypertension likely due to side effects of Lenvima  Lenvima  moved placed on hold She will proceed with pembrolizumab  only today

## 2023-09-22 NOTE — Telephone Encounter (Signed)
 Called and given below message. She verbalized understanding. Rx sent to her preferred pharmacy. She checked BP when she arrived home today and bp at home 128/75

## 2023-09-22 NOTE — Assessment & Plan Note (Addendum)
 Her blood pressure is poorly controlled We will hold Lenvima  Plan to increase the dose of atenolol  and reassess in her next visit

## 2023-09-22 NOTE — Patient Instructions (Signed)

## 2023-09-23 LAB — T4: T4, Total: 7.8 ug/dL (ref 4.5–12.0)

## 2023-09-25 ENCOUNTER — Other Ambulatory Visit: Payer: Self-pay

## 2023-09-25 DIAGNOSIS — Z794 Long term (current) use of insulin: Secondary | ICD-10-CM

## 2023-09-25 MED ORDER — ONETOUCH VERIO REFLECT W/DEVICE KIT: PACK | 0 refills | Status: DC

## 2023-09-25 MED ORDER — ONETOUCH DELICA LANCETS 33G MISC: 2 refills | Status: DC

## 2023-09-25 MED ORDER — ONETOUCH VERIO VI STRP: ORAL_STRIP | 3 refills | Status: DC

## 2023-09-28 ENCOUNTER — Other Ambulatory Visit: Payer: Self-pay

## 2023-09-28 MED ORDER — ACCU-CHEK GUIDE TEST VI STRP: ORAL_STRIP | 12 refills | Status: DC

## 2023-09-28 MED ORDER — ACCU-CHEK GUIDE W/DEVICE KIT: PACK | 0 refills | Status: DC

## 2023-09-28 MED ORDER — ACCU-CHEK FASTCLIX LANCETS MISC: 3 refills | Status: DC

## 2023-09-29 ENCOUNTER — Other Ambulatory Visit: Payer: Self-pay

## 2023-09-29 MED ORDER — ACCU-CHEK SOFTCLIX LANCETS MISC: 12 refills | Status: DC

## 2023-10-02 ENCOUNTER — Other Ambulatory Visit: Payer: Self-pay

## 2023-10-02 MED ORDER — ACCU-CHEK SOFTCLIX LANCETS MISC: 12 refills | Status: DC

## 2023-10-04 ENCOUNTER — Telehealth: Payer: Self-pay | Admitting: *Deleted

## 2023-10-04 NOTE — Telephone Encounter (Signed)
 Pt called stating the her bp has been running lower than usual. Today she checked her b/p after lunch and it was 102/65. She stated that she is taking recent prescribed rx once before bed instead of twice daily as directed for the past week. Pt stated that she has no dizziness or lightheadedness when bp drops. She mentioned also that her legs started swelling this past Sunday and will be taking new rx concerning that. Advised pt to monitor bp daily and to document. Made pt aware that provider will be notified of the changes she herself has made with prescribed bp med and office will call if there are any recommendations. Pt verbalized understanding.

## 2023-10-05 ENCOUNTER — Telehealth: Payer: Self-pay

## 2023-10-05 NOTE — Telephone Encounter (Signed)
 Called her regarding her call yesterday regarding bp being lower. Bp this am 140/72, she is taking Atenolol  only at night due to low readings and taking Amlodipine in the am. She is having leg swelling and started taking her Spironolactone  25 mg from Dr. Chaney Comfort yesterday. She just wanted to keep the office updated and is aware that the Atenolol  did not cause the swelling.

## 2023-10-11 ENCOUNTER — Ambulatory Visit (INDEPENDENT_AMBULATORY_CARE_PROVIDER_SITE_OTHER): Payer: Medicare Other | Admitting: Internal Medicine

## 2023-10-11 VITALS — BP 126/80 | HR 80 | Ht 66.0 in | Wt 186.0 lb

## 2023-10-11 DIAGNOSIS — Z794 Long term (current) use of insulin: Secondary | ICD-10-CM

## 2023-10-11 DIAGNOSIS — N1831 Chronic kidney disease, stage 3a: Secondary | ICD-10-CM

## 2023-10-11 DIAGNOSIS — E1142 Type 2 diabetes mellitus with diabetic polyneuropathy: Secondary | ICD-10-CM | POA: Diagnosis not present

## 2023-10-11 DIAGNOSIS — E1122 Type 2 diabetes mellitus with diabetic chronic kidney disease: Secondary | ICD-10-CM | POA: Diagnosis not present

## 2023-10-11 DIAGNOSIS — E1165 Type 2 diabetes mellitus with hyperglycemia: Secondary | ICD-10-CM

## 2023-10-11 LAB — POCT GLYCOSYLATED HEMOGLOBIN (HGB A1C): Hemoglobin A1C: 7.1 % — AB (ref 4.0–5.6)

## 2023-10-11 MED ORDER — GLIPIZIDE 5 MG PO TABS: ORAL_TABLET | ORAL | 3 refills | Status: DC

## 2023-10-11 NOTE — Patient Instructions (Signed)
 Change Glipizide   5 mg, 2  tablets before Breakfast and one tablet before Supper    HOW TO TREAT LOW BLOOD SUGARS (Blood sugar LESS THAN 70 MG/DL) Please follow the RULE OF 15 for the treatment of hypoglycemia treatment (when your (blood sugars are less than 70 mg/dL)   STEP 1: Take 15 grams of carbohydrates when your blood sugar is low, which includes:  3-4 GLUCOSE TABS  OR 3-4 OZ OF JUICE OR REGULAR SODA OR ONE TUBE OF GLUCOSE GEL    STEP 2: RECHECK blood sugar in 15 MINUTES STEP 3: If your blood sugar is still low at the 15 minute recheck --> then, go back to STEP 1 and treat AGAIN with another 15 grams of carbohydrates.

## 2023-10-11 NOTE — Progress Notes (Signed)
 Name: Krista Johnson  Age/ Sex: 73 y.o., female   MRN/ DOB: [id number], 1950/08/14     PCP: Verena Reena, MD   Reason for Endocrinology Evaluation: Type 2 Diabetes Mellitus  Initial Endocrine Consultative Visit: 12/26/2017    PATIENT IDENTIFIER: Krista Johnson is a 73 y.o. female with a past medical history of DM, HTN, hypothyroid, CKD, Hx endometrial cancer. The patient has followed with Endocrinology clinic since 12/26/2017 for consultative assistance with management of her diabetes.  DIABETIC HISTORY:  Krista Johnson was diagnosed with DM 2004, she was on insulin  between 2019-2022 while on chemotherapy. Her hemoglobin A1c has ranged from 5.8% in 2022, peaking at 14.9% in 2019. She was following up with Dr. Kassie, subsequently saw Dr. Von 09/2021  Jardiance caused constipation      SUBJECTIVE:   During the last visit (06/13/2023): A1c 6.5%  Today (10/11/2023): Krista Johnson is here for follow-up on diabetes management.  She checks her blood sugars occasionally . The patient has  had hypoglycemic episodes since the last clinic visit. The patient is symptomatic with these episodes, which occurs in the afternoon.  Patient follows with oncology for endometrial cancer, she is on Keytruda  infusions Q 6 weeks   Pt had a follow up with podiatry 08/09/2023  In reviewing her history, the patient has been noted with elevated TSH, this has been attributed to Keytruda  and oncology her adjusted levothyroxine  dose  Denies nausea or vomiting  Denies constipation or diarrhea  She feels well    HOME DIABETES REGIMEN:  Glipizide  5 mg , half a tab BID- takes 1 tablet BID     Statin: Yes ACE-I/ARB: no  METER DOWNLOAD SUMMARY: 90 day average 116 mg/dL      DIABETIC COMPLICATIONS: Microvascular complications:  CKD III Denies:  Last Eye Exam: Completed   Macrovascular complications:   Denies: CAD, CVA, PVD   HISTORY:  Past Medical History:  Past Medical History:  Diagnosis  Date   Arthritis    right knee--- last cortisone injection 04/ 2019   CKD (chronic kidney disease)    Diabetes mellitus without complication [facility])    endocrinologist-  dr kassie   Endometrial cancer [facility])    History of colon polyps    Hyperlipidemia    Hyperparathyroidism (HCC)    Hypertension    Past Surgical History:  Past Surgical History:  Procedure Laterality Date   AXILLARY LYMPH NODE BIOPSY Right 04/22/2022   Procedure: RIGHT AXILLARY LYMPH NODE BIOPSY;  Surgeon: Lyndel Deward PARAS, MD;  Location: [facility] OR;  Service: General;  Laterality: Right;   COLONOSCOPY  last one 12-25-2017   IR IMAGING GUIDED PORT INSERTION  02/20/2018   ROBOTIC ASSISTED TOTAL HYSTERECTOMY WITH BILATERAL SALPINGO OOPHERECTOMY N/A 01/23/2018   Procedure: XI ROBOTIC ASSISTED TOTAL HYSTERECTOMY WITH BILATERAL SALPINGO OOPHORECTOMY;  Surgeon: Eloy Herring, MD;  Location: [facility] ORS;  Service: Gynecology;  Laterality: N/A;   SENTINEL NODE BIOPSY N/A 01/23/2018   Procedure: SENTINEL NODE BIOPSY;  Surgeon: Eloy Herring, MD;  Location: [facility] ORS;  Service: Gynecology;  Laterality: N/A;   Social History:  reports that she has never smoked. She has never used smokeless tobacco. She reports that she does not currently use alcohol. She reports that she does not use drugs. Family History:  Family History  Problem Relation Age of Onset   Diabetes Mother    Hypertension Mother    Diabetes Sister    Hypertension Sister    Diabetes Maternal Uncle    Colon cancer  Paternal Aunt    Diabetes Paternal Aunt    Stomach cancer Neg Hx    Rectal cancer Neg Hx    Esophageal cancer Neg Hx    Colon polyps Neg Hx      HOME MEDICATIONS: Allergies as of 10/11/2023       Reactions   Sulfa Antibiotics Nausea Only   Sulfamethoxazole Nausea Only        Medication List        Accurate as of October 11, 2023  9:46 AM. If you have any questions, ask your nurse or doctor.          amLODipine-valsartan 5-160 MG tablet Commonly known  as: EXFORGE Take 1 tablet by mouth daily after breakfast.   aspirin EC 81 MG tablet Take 81 mg by mouth daily. Swallow whole.   atenolol  25 MG tablet Commonly known as: Tenormin  Take 1 tablet (25 mg total) by mouth 2 (two) times daily.   Azelastine  HCl 0.15 % Soln Place 1-2 sprays into both nostrils daily as needed (sinus congestion/issues.).   calcium elemental as carbonate 400 MG chewable tablet Commonly known as: BARIATRIC TUMS ULTRA Chew 2 tablets by mouth daily in the afternoon.   diphenhydrAMINE  25 MG tablet Commonly known as: BENADRYL  Take 25 mg by mouth daily as needed (runny nose/sinus issues.).   ergocalciferol  1.25 MG (50000 UT) capsule Commonly known as: VITAMIN D2 Take 1 capsule (50,000 Units total) by mouth once a week. What changed:  when to take this additional instructions   fexofenadine 180 MG tablet Commonly known as: ALLEGRA Take 180 mg by mouth daily as needed for allergies or rhinitis.   glipiZIDE  5 MG tablet Commonly known as: GLUCOTROL  Take 0.5 tablets (2.5 mg total) by mouth 2 (two) times daily before a meal.   lenvatinib  10 mg daily dose capsule Commonly known as: Lenvima  (10 MG Daily Dose) Take 1 capsule (10 mg total) by mouth daily. HOLD Lenvima  until further notice on 6/6-NG   levothyroxine  200 MCG tablet Commonly known as: Synthroid  Take 1 tablet (200 mcg total) by mouth daily before breakfast.   lidocaine -prilocaine  cream Commonly known as: EMLA  Apply 1 Application topically daily as needed (prior to port access.).   OneTouch Delica Lancets 33G Misc Check blood sugar 3x daily   Accu-Chek FastClix Lancets Misc Check blood sugar 3x daily   Accu-Chek Softclix Lancets lancets Check blood sugar 3x daily   OneTouch Verio Reflect w/Device Kit Check blood sugar 3x daily   Accu-Chek Guide w/Device Kit Check blood sugars 3x daily   OneTouch Verio test strip Generic drug: glucose blood Use as instructed to check blood sugars 3 times  a day.   Accu-Chek Guide Test test strip Generic drug: glucose blood Check blood sugar 3x daily   ReliOn Pen Needles 32G X 4 MM Misc Generic drug: Insulin  Pen Needle USE 1 PEN PER DAY   simvastatin  10 MG tablet Commonly known as: ZOCOR  Take 10 mg by mouth at bedtime.         OBJECTIVE:   Vital Signs: BP 126/80 (BP Location: Right Arm, Patient Position: Sitting, Cuff Size: Normal)   Pulse 80   Ht 5' 6 (1.676 m)   Wt 186 lb (84.4 kg)   SpO2 96%   BMI 30.02 kg/m   Wt Readings from Last 3 Encounters:  10/11/23 186 lb (84.4 kg)  09/22/23 179 lb 6.4 oz (81.4 kg)  08/11/23 185 lb 9.6 oz (84.2 kg)     Exam: General: Pt  appears well and is in NAD  Neck: General: Supple without adenopathy. Thyroid :  No goiter or nodules appreciated.   Lungs: Clear with good BS bilat   Heart: RRR   Extremities: Trace pretibial edema.   Neuro: MS is good with appropriate affect, pt is alert and Ox3    DM foot exam: 08/09/2023 per podiatry        DATA REVIEWED:  Lab Results  Component Value Date   HGBA1C 7.1 (A) 10/11/2023   HGBA1C 6.5 (A) 06/13/2023   HGBA1C 6.8 08/09/2022    Latest Reference Range & Units 09/22/23 08:22  TSH 0.350 - 4.500 uIU/mL 17.100 (H)      Latest Reference Range & Units 09/22/23 08:21  Sodium 135 - 145 mmol/L 144  Potassium 3.5 - 5.1 mmol/L 3.6  Chloride 98 - 111 mmol/L 112 (H)  CO2 22 - 32 mmol/L 23  Glucose 70 - 99 mg/dL 863 (H)  BUN 8 - 23 mg/dL 21  Creatinine 9.55 - 8.99 mg/dL 8.72 (H)  Calcium 8.9 - 10.3 mg/dL 9.0  Anion gap 5 - 15  9  Alkaline Phosphatase 38 - 126 U/L 64  Albumin 3.5 - 5.0 g/dL 3.9  AST 15 - 41 U/L 18  ALT 0 - 44 U/L 17  Total Protein 6.5 - 8.1 g/dL 6.5  Total Bilirubin 0.0 - 1.2 mg/dL 0.6  GFR, Est Non African American >60 mL/min 45 (L)   Old records , labs and images have been reviewed.    In Office BG 211 mg/dL   ASSESSMENT / PLAN / RECOMMENDATIONS:   1) Type 2 Diabetes Mellitus, Sub-Optimally controlled,  With CKD III complications - Most recent A1c of 7.1 %. Goal A1c < 7.0 %.     -A1c has trended up -Patient intolerant to Jardiance due to constipation, Krista Johnson is not covered -Patient is cost conscious and would like to stay on medications that are affordable - Will adjust glipizide  as below   MEDICATIONS:  Change glipizide  5 mg, 2 tablets before breakfast and 1 tablet before supper  EDUCATION / INSTRUCTIONS: BG monitoring instructions: Patient is instructed to check her blood sugars 1 times a day. Call [facility] if: BG persistently < 70  I reviewed the Rule of 15 for the treatment of hypoglycemia in detail with the patient. Literature supplied.    2) Diabetic complications:  Eye: Does not have known diabetic retinopathy.  Neuro/ Feet: Does  have known diabetic peripheral neuropathy .  Renal: Patient does  have known baseline CKD. She   is  on an ACEI/ARB at present.     F/U in 4 months   Signed electronically by: Stefano Redgie Butts, MD  [facility]  [facility] [address] [place], [place] [address] Phone: [phone] FAX: [phone]   CC: Verena Mems, MD [address] [place] [address] Phone: [phone]  Fax: [phone]  Return to Endocrinology clinic as below: Future Appointments  Date Time Provider Department Center  11/03/2023  8:15 AM [facility] FLUSH [facility] None  11/03/2023  8:40 AM Lonn Hicks, MD [facility] None  11/03/2023  9:15 AM [facility] INFUSION [facility] None  11/07/2023  9:00 AM Gaynel Delon CROME, DPM [facility]  12/15/2023  7:30 AM [facility] FLUSH [facility] None  12/15/2023  8:00 AM Lonn Hicks, MD [facility] None  12/15/2023  8:30 AM [facility] INFUSION [facility] None  02/07/2024  9:45 AM Gaynel Delon CROME, DPM [facility]

## 2023-10-12 ENCOUNTER — Other Ambulatory Visit: Payer: Self-pay | Admitting: Hematology and Oncology

## 2023-10-16 ENCOUNTER — Encounter: Payer: Self-pay | Admitting: Hematology and Oncology

## 2023-10-16 ENCOUNTER — Telehealth: Payer: Self-pay

## 2023-10-16 NOTE — Telephone Encounter (Signed)
 Returned her call and left a message asking her to call the office back. She left a message that she wanted to talk about her BP.

## 2023-10-17 ENCOUNTER — Encounter: Payer: Self-pay | Admitting: Hematology and Oncology

## 2023-11-03 ENCOUNTER — Encounter: Payer: Self-pay | Admitting: Hematology and Oncology

## 2023-11-03 ENCOUNTER — Inpatient Hospital Stay: Attending: Gynecology

## 2023-11-03 ENCOUNTER — Inpatient Hospital Stay

## 2023-11-03 ENCOUNTER — Inpatient Hospital Stay (HOSPITAL_BASED_OUTPATIENT_CLINIC_OR_DEPARTMENT_OTHER): Admitting: Hematology and Oncology

## 2023-11-03 ENCOUNTER — Ambulatory Visit: Payer: Self-pay | Admitting: Hematology and Oncology

## 2023-11-03 VITALS — BP 139/84 | HR 85 | Temp 97.6°F | Resp 18 | Ht 66.0 in | Wt 188.0 lb

## 2023-11-03 DIAGNOSIS — I10 Essential (primary) hypertension: Secondary | ICD-10-CM

## 2023-11-03 DIAGNOSIS — Z923 Personal history of irradiation: Secondary | ICD-10-CM | POA: Insufficient documentation

## 2023-11-03 DIAGNOSIS — C541 Malignant neoplasm of endometrium: Secondary | ICD-10-CM

## 2023-11-03 DIAGNOSIS — C549 Malignant neoplasm of corpus uteri, unspecified: Secondary | ICD-10-CM

## 2023-11-03 DIAGNOSIS — Z79620 Long term (current) use of immunosuppressive biologic: Secondary | ICD-10-CM | POA: Insufficient documentation

## 2023-11-03 DIAGNOSIS — D61818 Other pancytopenia: Secondary | ICD-10-CM

## 2023-11-03 DIAGNOSIS — Z7189 Other specified counseling: Secondary | ICD-10-CM | POA: Diagnosis not present

## 2023-11-03 DIAGNOSIS — Z5112 Encounter for antineoplastic immunotherapy: Secondary | ICD-10-CM | POA: Diagnosis not present

## 2023-11-03 DIAGNOSIS — C773 Secondary and unspecified malignant neoplasm of axilla and upper limb lymph nodes: Secondary | ICD-10-CM

## 2023-11-03 LAB — CBC WITH DIFFERENTIAL (CANCER CENTER ONLY)
Abs Immature Granulocytes: 0.02 K/uL (ref 0.00–0.07)
Basophils Absolute: 0 K/uL (ref 0.0–0.1)
Basophils Relative: 0 %
Eosinophils Absolute: 0.1 K/uL (ref 0.0–0.5)
Eosinophils Relative: 2 %
HCT: 32.2 % — ABNORMAL LOW (ref 36.0–46.0)
Hemoglobin: 10.3 g/dL — ABNORMAL LOW (ref 12.0–15.0)
Immature Granulocytes: 0 %
Lymphocytes Relative: 21 %
Lymphs Abs: 1.1 K/uL (ref 0.7–4.0)
MCH: 28.7 pg (ref 26.0–34.0)
MCHC: 32 g/dL (ref 30.0–36.0)
MCV: 89.7 fL (ref 80.0–100.0)
Monocytes Absolute: 0.3 K/uL (ref 0.1–1.0)
Monocytes Relative: 6 %
Neutro Abs: 3.8 K/uL (ref 1.7–7.7)
Neutrophils Relative %: 71 %
Platelet Count: 147 K/uL — ABNORMAL LOW (ref 150–400)
RBC: 3.59 MIL/uL — ABNORMAL LOW (ref 3.87–5.11)
RDW: 13 % (ref 11.5–15.5)
WBC Count: 5.3 K/uL (ref 4.0–10.5)
nRBC: 0 % (ref 0.0–0.2)

## 2023-11-03 LAB — CMP (CANCER CENTER ONLY)
ALT: 23 U/L (ref 0–44)
AST: 17 U/L (ref 15–41)
Albumin: 3.5 g/dL (ref 3.5–5.0)
Alkaline Phosphatase: 98 U/L (ref 38–126)
Anion gap: 4 — ABNORMAL LOW (ref 5–15)
BUN: 35 mg/dL — ABNORMAL HIGH (ref 8–23)
CO2: 22 mmol/L (ref 22–32)
Calcium: 9.9 mg/dL (ref 8.9–10.3)
Chloride: 114 mmol/L — ABNORMAL HIGH (ref 98–111)
Creatinine: 1.44 mg/dL — ABNORMAL HIGH (ref 0.44–1.00)
GFR, Estimated: 39 mL/min — ABNORMAL LOW (ref >60)
Glucose, Bld: 148 mg/dL — ABNORMAL HIGH (ref 70–99)
Potassium: 4.4 mmol/L (ref 3.5–5.1)
Sodium: 140 mmol/L (ref 135–145)
Total Bilirubin: 0.4 mg/dL (ref 0.0–1.2)
Total Protein: 6.1 g/dL — ABNORMAL LOW (ref 6.5–8.1)

## 2023-11-03 LAB — TSH: TSH: 0.014 u[IU]/mL — ABNORMAL LOW (ref 0.350–4.500)

## 2023-11-03 LAB — TOTAL PROTEIN, URINE DIPSTICK: Protein, ur: NEGATIVE mg/dL

## 2023-11-03 MED ORDER — SODIUM CHLORIDE 0.9% FLUSH
10.0000 mL | Freq: Once | INTRAVENOUS | Status: AC
  Administered 2023-11-03: 10 mL

## 2023-11-03 MED ORDER — HEPARIN SOD (PORK) LOCK FLUSH 100 UNIT/ML IV SOLN: 500.0000 [IU] | Freq: Once | INTRAVENOUS | Status: DC | PRN

## 2023-11-03 MED ORDER — SODIUM CHLORIDE 0.9 % IV SOLN
400.0000 mg | Freq: Once | INTRAVENOUS | Status: AC
  Administered 2023-11-03: 400 mg via INTRAVENOUS
  Filled 2023-11-03: qty 16

## 2023-11-03 MED ORDER — SODIUM CHLORIDE 0.9 % IV SOLN
Freq: Once | INTRAVENOUS | Status: AC
Start: 2023-11-03 — End: 2023-11-03

## 2023-11-03 MED ORDER — LIDOCAINE-PRILOCAINE 2.5-2.5 % EX CREA: 1.0000 | TOPICAL_CREAM | Freq: Every day | CUTANEOUS | 3 refills | Status: DC | PRN

## 2023-11-03 MED ORDER — SODIUM CHLORIDE 0.9% FLUSH
10.0000 mL | INTRAVENOUS | Status: DC | PRN
Start: 2023-11-03 — End: 2023-11-03

## 2023-11-03 MED ORDER — LENVATINIB (10 MG DAILY DOSE) 10 MG PO CPPK: 10.0000 mg | ORAL_CAPSULE | Freq: Every day | ORAL | Status: DC

## 2023-11-03 MED ORDER — LEVOTHYROXINE SODIUM 175 MCG PO TABS: 175.0000 ug | ORAL_TABLET | Freq: Every day | ORAL | 0 refills | Status: DC

## 2023-11-03 NOTE — Progress Notes (Signed)
 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%), MSI stable, ER 80%, PR 60%, Her2/neu neg, PD-L1 CPS 15%  She has disease relapse after completion of chemotherapy and was switched to pembrolizumab  and lenvatinib  with complete response, discontinued in 2023 She had biopsy proven disease relapse in 2024 and treatment was restarted again in October 2024  Overall, she tolerated treatment very well without major side effects except for poorly controlled hypertension and abnormal thyroid  function Imaging study from January 2025 and May 2025 showed positive response to therapy Lenvatinib  was placed on hold and since then, her blood pressure has dropped back to normal She has not been taking atenolol  that was prescribed last month Due to anticipated cataract surgery in the near future, she will continue to hold Lenvima  until August 18 I will see her again next month for further follow-up Pancytopenia, acquired Krista Johnson) This is chronic in nature She is not symptomatic Observe only for now Essential hypertension She has multiple cardiovascular risk factors Recent blood pressure was poorly controlled due to Lenvima  but that has since improved back to normal She will continue to hold Lenvima  for another month in anticipation for cataract surgery When she restart on August 18, the patient is made aware that her blood pressure might start to trend up again  Orders Placed This Encounter  Procedures   Total Protein, Urine dipstick    Standing Status:   Future    Expected Date:   01/25/2024    Expiration Date:   01/24/2025    CBC with Differential (Cancer Johnson Only)    Standing Status:   Future    Expected Date:   01/25/2024    Expiration Date:   01/24/2025   CMP (Cancer Johnson only)    Standing Status:   Future    Expected Date:   01/25/2024    Expiration Date:   01/24/2025   T4    Standing Status:   Future    Expected Date:   01/25/2024    Expiration Date:   01/24/2025   TSH    Standing Status:   Future    Expected Date:   01/25/2024    Expiration Date:   01/24/2025     Krista Bedford, MD  INTERVAL HISTORY: she returns for treatment follow-up Complications related to previous cycle of chemotherapy included Pancytopenia and Elevated BP She is doing well She showed me documented blood pressure from home which in general is less than 130 She has 2 planned cataract surgeries in the near future  PHYSICAL EXAMINATION: ECOG PERFORMANCE STATUS: 1 - Symptomatic but completely ambulatory  Vitals:   11/03/23 0906  BP: 139/84  Pulse: 85  Resp: 18  Temp: 97.6 F (36.4 C)  SpO2: 100%   Filed Weights   11/03/23 0906  Weight: 188 lb (85.3 kg)    Relevant data reviewed during this visit included CBC, CMP, total protein of urine and TSH

## 2023-11-03 NOTE — Telephone Encounter (Signed)
 Called pt per MD. She does not know if she has any synthroid  175 mcg and she is not currently at home. Since it is the weekend, sending rx per MD to [facility] phx per pt request. She is agreeable and understands

## 2023-11-03 NOTE — Assessment & Plan Note (Addendum)
 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%), MSI stable, ER 80%, PR 60%, Her2/neu neg, PD-L1 CPS 15%  She has disease relapse after completion of chemotherapy and was switched to pembrolizumab  and lenvatinib  with complete response, discontinued in 2023 She had biopsy proven disease relapse in 2024 and treatment was restarted again in October 2024  Overall, she tolerated treatment very well without major side effects except for poorly controlled hypertension and abnormal thyroid  function Imaging study from January 2025 and May 2025 showed positive response to therapy Lenvatinib  was placed on hold and since then, her blood pressure has dropped back to normal She has not been taking atenolol  that was prescribed last month Due to anticipated cataract surgery in the near future, she will continue to hold Lenvima  until August 18 I will see her again next month for further follow-up

## 2023-11-03 NOTE — Assessment & Plan Note (Addendum)
 She has multiple cardiovascular risk factors Recent blood pressure was poorly controlled due to Lenvima  but that has since improved back to normal She will continue to hold Lenvima  for another month in anticipation for cataract surgery When she restart on August 18, the patient is made aware that her blood pressure might start to trend up again

## 2023-11-03 NOTE — Addendum Note (Signed)
 Addended byBETHA BEDFORD, Mayuri Staples on: 11/03/2023 10:44 AM   Modules accepted: Orders

## 2023-11-03 NOTE — Telephone Encounter (Signed)
-----   Message from [employer] sent at 11/03/2023  2:13 PM EDT ----- TSH is too low Reduce synthroid  to 175 mcg If she does not have 175 mcg dose, send 60 tabs to her pharmacy, no refills ----- Message ----- From: Rebecka, Lab In [place] Sent: 11/03/2023   8:58 AM EDT To: Almarie Bedford, MD

## 2023-11-03 NOTE — Patient Instructions (Signed)
 [facility] - A DEPT OF [facility]  Discharge Instructions: Thank you for choosing [facility] to provide your oncology and hematology care.   If you have a lab appointment with the Cancer Center, please go directly to the Cancer Center and check in at the registration area.   Wear comfortable clothing and clothing appropriate for easy access to any Portacath or PICC line.   We strive to give you quality time with your provider. You may need to reschedule your appointment if you arrive late (15 or more minutes).  Arriving late affects you and other patients whose appointments are after yours.  Also, if you miss three or more appointments without notifying the office, you may be dismissed from the clinic at the provider's discretion.      For prescription refill requests, have your pharmacy contact our office and allow 72 hours for refills to be completed.    Today you received the following chemotherapy and/or immunotherapy agents [name]      To help prevent nausea and vomiting after your treatment, we encourage you to take your nausea medication as directed.  BELOW ARE SYMPTOMS THAT SHOULD BE REPORTED IMMEDIATELY: *FEVER GREATER THAN 100.4 F (38 C) OR HIGHER *CHILLS OR SWEATING *NAUSEA AND VOMITING THAT IS NOT CONTROLLED WITH YOUR NAUSEA MEDICATION *UNUSUAL SHORTNESS OF BREATH *UNUSUAL BRUISING OR BLEEDING *URINARY PROBLEMS (pain or burning when urinating, or frequent urination) *BOWEL PROBLEMS (unusual diarrhea, constipation, pain near the anus) TENDERNESS IN MOUTH AND THROAT WITH OR WITHOUT PRESENCE OF ULCERS (sore throat, sores in mouth, or a toothache) UNUSUAL RASH, SWELLING OR PAIN  UNUSUAL VAGINAL DISCHARGE OR ITCHING   Items with * indicate a potential emergency and should be followed up as soon as possible or go to the Emergency Department if any problems should occur.  Please show the CHEMOTHERAPY ALERT CARD or IMMUNOTHERAPY  ALERT CARD at check-in to the Emergency Department and triage nurse.  Should you have questions after your visit or need to cancel or reschedule your appointment, please contact [facility] - A DEPT OF [facility]  Dept: [phone]  and follow the prompts.  Office hours are 8:00 a.m. to 4:30 p.m. Monday - Friday. Please note that voicemails left after 4:00 p.m. may not be returned until the following business day.  We are closed weekends and major holidays. You have access to a nurse at all times for urgent questions. Please call the main number to the clinic Dept: [phone] and follow the prompts.   For any non-urgent questions, you may also contact your provider using MyChart. We now offer e-Visits for anyone 85 and older to request care online for non-urgent symptoms. For details visit [email or web address].   Also download the MyChart app! Go to the app store, search "MyChart", open the app, select , and log in with your MyChart username and password.

## 2023-11-03 NOTE — Assessment & Plan Note (Addendum)
This is chronic in nature She is not symptomatic Observe only for now 

## 2023-11-04 LAB — T4: T4, Total: 14.9 ug/dL — ABNORMAL HIGH (ref 4.5–12.0)

## 2023-11-07 ENCOUNTER — Other Ambulatory Visit: Payer: Self-pay

## 2023-11-07 ENCOUNTER — Encounter: Payer: Self-pay | Admitting: Podiatry

## 2023-11-07 ENCOUNTER — Encounter (HOSPITAL_COMMUNITY): Payer: Self-pay

## 2023-11-07 ENCOUNTER — Ambulatory Visit (INDEPENDENT_AMBULATORY_CARE_PROVIDER_SITE_OTHER): Payer: Medicare Other | Admitting: Podiatry

## 2023-11-07 ENCOUNTER — Observation Stay (HOSPITAL_COMMUNITY)
Admission: EM | Admit: 2023-11-07 | Discharge: 2023-11-09 | Disposition: A | Discharge status: Home / Self Care | Attending: Internal Medicine | Admitting: Internal Medicine

## 2023-11-07 DIAGNOSIS — C541 Malignant neoplasm of endometrium: Secondary | ICD-10-CM | POA: Diagnosis not present

## 2023-11-07 DIAGNOSIS — I10 Essential (primary) hypertension: Secondary | ICD-10-CM | POA: Diagnosis present

## 2023-11-07 DIAGNOSIS — B351 Tinea unguium: Secondary | ICD-10-CM

## 2023-11-07 DIAGNOSIS — E1122 Type 2 diabetes mellitus with diabetic chronic kidney disease: Secondary | ICD-10-CM | POA: Diagnosis not present

## 2023-11-07 DIAGNOSIS — N1831 Chronic kidney disease, stage 3a: Secondary | ICD-10-CM | POA: Diagnosis present

## 2023-11-07 DIAGNOSIS — E0822 Diabetes mellitus due to underlying condition with diabetic chronic kidney disease: Secondary | ICD-10-CM

## 2023-11-07 DIAGNOSIS — Z79899 Other long term (current) drug therapy: Secondary | ICD-10-CM | POA: Diagnosis not present

## 2023-11-07 DIAGNOSIS — L84 Corns and callosities: Secondary | ICD-10-CM

## 2023-11-07 DIAGNOSIS — E162 Hypoglycemia, unspecified: Secondary | ICD-10-CM | POA: Diagnosis not present

## 2023-11-07 DIAGNOSIS — E039 Hypothyroidism, unspecified: Secondary | ICD-10-CM | POA: Diagnosis not present

## 2023-11-07 DIAGNOSIS — N183 Chronic kidney disease, stage 3 unspecified: Secondary | ICD-10-CM | POA: Diagnosis not present

## 2023-11-07 DIAGNOSIS — G620 Drug-induced polyneuropathy: Secondary | ICD-10-CM | POA: Diagnosis not present

## 2023-11-07 DIAGNOSIS — M79674 Pain in right toe(s): Secondary | ICD-10-CM | POA: Diagnosis not present

## 2023-11-07 DIAGNOSIS — E11649 Type 2 diabetes mellitus with hypoglycemia without coma: Secondary | ICD-10-CM | POA: Diagnosis not present

## 2023-11-07 DIAGNOSIS — I129 Hypertensive chronic kidney disease with stage 1 through stage 4 chronic kidney disease, or unspecified chronic kidney disease: Secondary | ICD-10-CM | POA: Diagnosis not present

## 2023-11-07 DIAGNOSIS — Z7984 Long term (current) use of oral hypoglycemic drugs: Secondary | ICD-10-CM | POA: Diagnosis not present

## 2023-11-07 DIAGNOSIS — N1832 Chronic kidney disease, stage 3b: Secondary | ICD-10-CM | POA: Diagnosis not present

## 2023-11-07 DIAGNOSIS — Z7901 Long term (current) use of anticoagulants: Secondary | ICD-10-CM | POA: Insufficient documentation

## 2023-11-07 DIAGNOSIS — Z794 Long term (current) use of insulin: Secondary | ICD-10-CM

## 2023-11-07 DIAGNOSIS — M79675 Pain in left toe(s): Secondary | ICD-10-CM

## 2023-11-07 LAB — CBC WITH DIFFERENTIAL/PLATELET
Abs Immature Granulocytes: 0.02 K/uL (ref 0.00–0.07)
Basophils Absolute: 0 K/uL (ref 0.0–0.1)
Basophils Relative: 0 %
Eosinophils Absolute: 0.1 K/uL (ref 0.0–0.5)
Eosinophils Relative: 1 %
HCT: 39 % (ref 36.0–46.0)
Hemoglobin: 11.2 g/dL — ABNORMAL LOW (ref 12.0–15.0)
Immature Granulocytes: 0 %
Lymphocytes Relative: 33 %
Lymphs Abs: 2.2 K/uL (ref 0.7–4.0)
MCH: 28.3 pg (ref 26.0–34.0)
MCHC: 28.7 g/dL — ABNORMAL LOW (ref 30.0–36.0)
MCV: 98.5 fL (ref 80.0–100.0)
Monocytes Absolute: 0.7 K/uL (ref 0.1–1.0)
Monocytes Relative: 11 %
Neutro Abs: 3.6 K/uL (ref 1.7–7.7)
Neutrophils Relative %: 55 %
Platelets: 168 K/uL (ref 150–400)
RBC: 3.96 MIL/uL (ref 3.87–5.11)
RDW: 13.1 % (ref 11.5–15.5)
WBC: 6.6 K/uL (ref 4.0–10.5)
nRBC: 0 % (ref 0.0–0.2)

## 2023-11-07 LAB — COMPREHENSIVE METABOLIC PANEL WITH GFR
ALT: 33 U/L (ref 0–44)
AST: 25 U/L (ref 15–41)
Albumin: 3.3 g/dL — ABNORMAL LOW (ref 3.5–5.0)
Alkaline Phosphatase: 82 U/L (ref 38–126)
Anion gap: 11 (ref 5–15)
BUN: 32 mg/dL — ABNORMAL HIGH (ref 8–23)
CO2: 17 mmol/L — ABNORMAL LOW (ref 22–32)
Calcium: 9.7 mg/dL (ref 8.9–10.3)
Chloride: 108 mmol/L (ref 98–111)
Creatinine, Ser: 1.35 mg/dL — ABNORMAL HIGH (ref 0.44–1.00)
GFR, Estimated: 42 mL/min — ABNORMAL LOW (ref >60)
Glucose, Bld: 269 mg/dL — ABNORMAL HIGH (ref 70–99)
Potassium: 3.9 mmol/L (ref 3.5–5.1)
Sodium: 136 mmol/L (ref 135–145)
Total Bilirubin: 0.4 mg/dL (ref 0.0–1.2)
Total Protein: 6 g/dL — ABNORMAL LOW (ref 6.5–8.1)

## 2023-11-07 LAB — CBG MONITORING, ED
Glucose-Capillary: 14 mg/dL — CL (ref 70–99)
Glucose-Capillary: 113 mg/dL — ABNORMAL HIGH (ref 70–99)
Glucose-Capillary: 12 mg/dL — CL (ref 70–99)
Glucose-Capillary: 36 mg/dL — CL (ref 70–99)
Glucose-Capillary: 104 mg/dL — ABNORMAL HIGH (ref 70–99)
Glucose-Capillary: 95 mg/dL (ref 70–99)
Glucose-Capillary: 145 mg/dL — ABNORMAL HIGH (ref 70–99)
Glucose-Capillary: 116 mg/dL — ABNORMAL HIGH (ref 70–99)

## 2023-11-07 LAB — GLUCOSE, CAPILLARY: Glucose-Capillary: 107 mg/dL — ABNORMAL HIGH (ref 70–99)

## 2023-11-07 MED ORDER — SIMVASTATIN 10 MG PO TABS
10.0000 mg | ORAL_TABLET | Freq: Every day | ORAL | Status: DC
  Administered 2023-11-07 – 2023-11-08 (×2): 10 mg via ORAL
  Filled 2023-11-07 (×2): qty 1

## 2023-11-07 MED ORDER — DEXTROSE 10 % IV SOLN: INTRAVENOUS | Status: DC

## 2023-11-07 MED ORDER — ACETAMINOPHEN 325 MG PO TABS: 650.0000 mg | ORAL_TABLET | Freq: Four times a day (QID) | ORAL | Status: DC | PRN

## 2023-11-07 MED ORDER — ASPIRIN 81 MG PO TBEC
81.0000 mg | DELAYED_RELEASE_TABLET | Freq: Every day | ORAL | Status: DC
  Administered 2023-11-08 – 2023-11-09 (×2): 81 mg via ORAL
  Filled 2023-11-07 (×2): qty 1

## 2023-11-07 MED ORDER — ALBUTEROL SULFATE (2.5 MG/3ML) 0.083% IN NEBU: 2.5000 mg | INHALATION_SOLUTION | RESPIRATORY_TRACT | Status: DC | PRN

## 2023-11-07 MED ORDER — LEVOTHYROXINE SODIUM 75 MCG PO TABS
175.0000 ug | ORAL_TABLET | Freq: Every day | ORAL | Status: DC
  Administered 2023-11-08 – 2023-11-09 (×2): 175 ug via ORAL
  Filled 2023-11-07 (×2): qty 1

## 2023-11-07 MED ORDER — DEXTROSE 50 % IV SOLN
INTRAVENOUS | Status: AC
  Administered 2023-11-07: 25 g via INTRAVENOUS
  Filled 2023-11-07: qty 50

## 2023-11-07 MED ORDER — DEXTROSE 50 % IV SOLN
1.0000 | Freq: Once | INTRAVENOUS | Status: AC
  Administered 2023-11-07: 50 mL via INTRAVENOUS
  Filled 2023-11-07: qty 50

## 2023-11-07 MED ORDER — ONDANSETRON HCL 4 MG/2ML IJ SOLN: 4.0000 mg | Freq: Four times a day (QID) | INTRAMUSCULAR | Status: DC | PRN

## 2023-11-07 MED ORDER — DEXTROSE 50 % IV SOLN: 25.0000 g | Freq: Once | INTRAVENOUS | Status: AC

## 2023-11-07 MED ORDER — ENOXAPARIN SODIUM 40 MG/0.4ML IJ SOSY
40.0000 mg | PREFILLED_SYRINGE | INTRAMUSCULAR | Status: DC
  Administered 2023-11-07 – 2023-11-08 (×2): 40 mg via SUBCUTANEOUS
  Filled 2023-11-07 (×2): qty 0.4

## 2023-11-07 MED ORDER — ONDANSETRON HCL 4 MG PO TABS: 4.0000 mg | ORAL_TABLET | Freq: Four times a day (QID) | ORAL | Status: DC | PRN

## 2023-11-07 MED ORDER — ACETAMINOPHEN 650 MG RE SUPP: 650.0000 mg | Freq: Four times a day (QID) | RECTAL | Status: DC | PRN

## 2023-11-07 MED ORDER — POLYETHYLENE GLYCOL 3350 17 G PO PACK: 17.0000 g | PACK | Freq: Every day | ORAL | Status: DC | PRN

## 2023-11-07 NOTE — ED Triage Notes (Signed)
 POV/ by self/ pt c/o low blood sugar @ home/ pt reports 41 @ 0100 this am/ 38 @ 1100 today/ hx of type 2 DM/ pt reports feeling nervous and uncomfortable/ pt is ambulatory and A&OX4

## 2023-11-07 NOTE — H&P (Signed)
 History and Physical    Patient: Krista Johnson FMW:[id number] DOB: 1950-10-18 DOA: 11/07/2023 DOS: the patient was seen and examined on 11/07/2023 PCP: Verena Reena, MD  Patient coming from: Home  Chief Complaint: Hypoglycemia Chief Complaint  Patient presents with   Hypoglycemia   HPI: Krista Johnson is a 73 y.o. female with medical history significant of endometrial cancer s/p TAH w/ bilateral oophorectomy s/p chemo/radiation, HTN, DM2, hypothyroidism, acquired pancytopenia who presented to [facility] ED on 7/22 with reported low blood sugar, 41 this morning.  Patient on glipizide  outpatient, recently seen by endocrinology on 6/25 with increase in glipizide  from 2.5 mg p.o. twice daily to 10 mg p.o. every morning and 5 mg p.o. every afternoon.  Patient reports intermittent low blood sugars since but responded well to oral intake until today.  Patient endorses generalized weakness, fatigue.  No other complaints at this time.  Denies headache, no visual changes, no chest pain, no palpitations, no shortness of breath, no abdominal pain, no fever/chills/night sweats, no nausea/vomiting/diarrhea, no focal weakness, no fatigue, no paresthesias.  In the ED, temperature 97.7 F, HR 92, RR 18, BP 142/92, SpO2 100% on room air.  Glucose on arrival 14.  WBC 6.6, hemoglobin 11.2, platelet count 168.  Sodium 136, potassium 3.9, chloride 108, CO2 17, glucose 269 (after receiving 2 A of D10 and started on D10 drip), BUN 32, creat 1.35, AST 25, ALT 33, total bilirubin 0.4.  Patient received 2 A of D10, started on D10 drip.  TRH consulted for observation admission for further evaluation and management of hypoglycemia likely related to recent increase in glipizide  outpatient.   Review of Systems: As mentioned in the history of present illness. All other systems reviewed and are negative. Past Medical History:  Diagnosis Date   Arthritis    right knee--- last cortisone injection 04/ 2019   CKD (chronic kidney  disease)    Diabetes mellitus without complication [facility])    endocrinologist-  dr kassie   Endometrial cancer [facility])    History of colon polyps    Hyperlipidemia    Hyperparathyroidism (HCC)    Hypertension    Past Surgical History:  Procedure Laterality Date   AXILLARY LYMPH NODE BIOPSY Right 04/22/2022   Procedure: RIGHT AXILLARY LYMPH NODE BIOPSY;  Surgeon: Lyndel Deward PARAS, MD;  Location: [facility] OR;  Service: General;  Laterality: Right;   COLONOSCOPY  last one 12-25-2017   IR IMAGING GUIDED PORT INSERTION  02/20/2018   ROBOTIC ASSISTED TOTAL HYSTERECTOMY WITH BILATERAL SALPINGO OOPHERECTOMY N/A 01/23/2018   Procedure: XI ROBOTIC ASSISTED TOTAL HYSTERECTOMY WITH BILATERAL SALPINGO OOPHORECTOMY;  Surgeon: Eloy Herring, MD;  Location: [facility] ORS;  Service: Gynecology;  Laterality: N/A;   SENTINEL NODE BIOPSY N/A 01/23/2018   Procedure: SENTINEL NODE BIOPSY;  Surgeon: Eloy Herring, MD;  Location: [facility] ORS;  Service: Gynecology;  Laterality: N/A;   Social History:  reports that she has never smoked. She has never used smokeless tobacco. She reports that she does not currently use alcohol. She reports that she does not use drugs.  Allergies  Allergen Reactions   Sulfa Antibiotics Nausea Only   Atenolol  Other (See Comments)    Dropped B/P too much    Family History  Problem Relation Age of Onset   Diabetes Mother    Hypertension Mother    Diabetes Sister    Hypertension Sister    Diabetes Maternal Uncle    Colon cancer Paternal Aunt    Diabetes Paternal Aunt  Stomach cancer Neg Hx    Rectal cancer Neg Hx    Esophageal cancer Neg Hx    Colon polyps Neg Hx     Prior to Admission medications   Medication Sig Start Date End Date Taking? Authorizing Provider  Accu-Chek FastClix Lancets MISC Check blood sugar 3x daily 09/28/23   Shamleffer, Ibtehal Jaralla, MD  Accu-Chek Softclix Lancets lancets Check blood sugar 3x daily 10/02/23   Shamleffer, Ibtehal Jaralla, MD  amLODipine-valsartan  (EXFORGE) 5-160 MG tablet Take 1 tablet by mouth daily after breakfast. 09/15/20   [provider]  aspirin  EC 81 MG tablet Take 81 mg by mouth daily. Swallow whole.    [provider]  Azelastine  HCl 0.15 % SOLN Place 1-2 sprays into both nostrils daily as needed (sinus congestion/issues.). 03/31/23   Lonn Hicks, MD  Blood Glucose Monitoring Suppl (ACCU-CHEK GUIDE) w/Device KIT Check blood sugars 3x daily 09/28/23   Shamleffer, Ibtehal Jaralla, MD  Blood Glucose Monitoring Suppl (ONETOUCH VERIO REFLECT) w/Device KIT Check blood sugar 3x daily 09/25/23   Shamleffer, Ibtehal Jaralla, MD  calcium  elemental as carbonate (BARIATRIC TUMS ULTRA) 400 MG chewable tablet Chew 2 tablets by mouth daily in the afternoon.    [provider]  diphenhydrAMINE  (BENADRYL ) 25 MG tablet Take 25 mg by mouth daily as needed (runny nose/sinus issues.).    [provider]  ergocalciferol  (VITAMIN D2) 1.25 MG (50000 UT) capsule Take 1 capsule (50,000 Units total) by mouth once a week. Patient taking differently: Take 50,000 Units by mouth 2 (two) times a week. Wednesdays & Saturdays. 01/04/22   Von Pacific, MD  fexofenadine (ALLEGRA) 180 MG tablet Take 180 mg by mouth daily as needed for allergies or rhinitis.    [provider]  glipiZIDE  (GLUCOTROL ) 5 MG tablet Take 2 tablets (10 mg total) by mouth daily before breakfast AND 1 tablet (5 mg total) daily before supper. 10/11/23   Shamleffer, Ibtehal Jaralla, MD  glucose blood (ACCU-CHEK GUIDE TEST) test strip Check blood sugar 3x daily 09/28/23   Shamleffer, Ibtehal Jaralla, MD  glucose blood (ONETOUCH VERIO) test strip Use as instructed to check blood sugars 3 times a day. 09/25/23   Shamleffer, Ibtehal Jaralla, MD  lenvatinib  10 mg daily dose (LENVIMA , 10 MG DAILY DOSE,) capsule Take 1 capsule (10 mg total) by mouth daily. HOLD Lenvima  until 8/18-NG 11/03/23   Lonn Hicks, MD  levothyroxine  (SYNTHROID ) 175 MCG tablet Take 1 tablet (175  mcg total) by mouth daily before breakfast. 11/03/23   Lonn Hicks, MD  lidocaine -prilocaine  (EMLA ) cream Apply 1 Application topically daily as needed (prior to port access.). 11/03/23   Lonn Hicks, MD  OneTouch Delica Lancets 33G MISC Check blood sugar 3x daily 09/25/23   Shamleffer, Ibtehal Jaralla, MD  RELION PEN NEEDLES 32G X 4 MM MISC USE 1 PEN PER DAY 04/23/20   Kassie Mallick, MD  simvastatin  (ZOCOR ) 10 MG tablet Take 10 mg by mouth at bedtime.  07/09/17   [provider]    Physical Exam: Vitals:   11/07/23 1540 11/07/23 1545 11/07/23 1600 11/07/23 1635  BP: 124/75 102/85 113/84 120/74  Pulse:      Resp:      Temp:      TempSrc:      SpO2:      Weight:      Height:       Physical Exam GEN: 73 yo female in NAD, alert and oriented x 3, wd/wn HEENT: NCAT, PERRL, EOMI, sclera clear, MMM  PULM: CTAB w/o wheezes/crackles, normal respiratory effort, room air CV: RRR w/o M/G/R GI: abd soft, NTND, NABS, no R/G/M MSK: no peripheral edema, muscle strength globally intact 5/5 bilateral upper/lower extremities NEURO: CN II-XII intact, no focal deficits, sensation to light touch intact PSYCH: normal mood/affect Integumentary: dry/intact, no rashes or wounds   Assessment and Plan:  Hypoglycemia, symptomatic Type 2 diabetes mellitus Patient presenting to the ED with hyperglycemia, reported 41 at home.  On arrival to the ED glucose was noted to be 12.  Received 2 A of D50 and given downtrending glucose following treatment was started on a D10 drip.  Followed by endocrinology for diabetes outpatient, Dr. Sam; recently seen in clinic 10/11/2023, in which her glipizide  was increased from 2.5 mg BID to 10mg  PO qAm and 5mg  PO qPM.  Recent hemoglobin A1c 7.1 on 10/11/2023. -- Admit to observation -- Repeat glucose on BMP up to 269, will reduce D10 drip from 100 mL/h to 50 mL/h -- Continue to monitor CBGs every 4 hours -- Holding home glipizide , suspect will need dose reduction at time  of discharge -- Will need close outpatient follow-up with endocrinology  HTN On amlodipine-valsartan 5-160 mg p.o. daily at baseline.  Blood pressure 120/74. -- Hold home hypertensives for now -- Monitor BP closely  Hypothyroidism -- Continue levothyroxine   Endometrial cancer s/p TAH w/ bilateral oophorectomy s/p chemo/radiation.  Follows with medical oncology outpatient, Dr. Lonn.    Advance Care Planning:   Code Status: Full Code   Consults: None  Family Communication: None  Severity of Illness: The appropriate patient status for this patient is OBSERVATION. Observation status is judged to be reasonable and necessary in order to provide the required intensity of service to ensure the patient's safety. The patient's presenting symptoms, physical exam findings, and initial radiographic and laboratory data in the context of their medical condition is felt to place them at decreased risk for further clinical deterioration. Furthermore, it is anticipated that the patient will be medically stable for discharge from the hospital within 2 midnights of admission.   Author: Camellia PARAS [place], DO 11/07/2023 6:05 PM  For on call review [email or web address].

## 2023-11-07 NOTE — ED Provider Notes (Signed)
  ED Course / MDM   Clinical Course as of 11/07/23 1755  Tue Nov 07, 2023  1454 Patient's repeat blood sugar still low.  She remains awake and alert. [MB]  [id number] Patient remains awake alert asymptomatic.  Has eaten food and ambulated in the department without any difficulty.  Repeat blood sugar 104.  Still waiting on her chemistries. [MB]  1638 Received sign out from Dr. Towana, presented with low blood sugar. Was as low as 14 here and required multiple amp of D50 and on D10 drip. Patient appears very well and mentation normally. Not on insulin . Has had decreased PO intake  [WS]  1729 Pt well appearing. Stable. Agreeable for admission  [WS]  1755 Patient signed out to hospitalist for admission.  [WS]    Clinical Course User Index [MB] Towana Ozell BROCKS, MD [WS] Francesca Elsie CROME, MD   Medical Decision Making Amount and/or Complexity of Data Reviewed Labs: ordered.  Risk Prescription drug management. Decision regarding hospitalization.         Francesca Elsie CROME, MD 11/07/23 [phone]

## 2023-11-07 NOTE — ED Provider Notes (Signed)
 [facility]ENC[facility] DEPARTMENT AT [facility] Provider Note   CSN: [id number] Arrival date & time: 11/07/23  1346     Patient presents with: Hypoglycemia   Krista Johnson is a 73 y.o. female.  She has a history of diabetes on oral medication and endometrial cancer.  She said she had vomiting and diarrhea few days ago and has not really had much of an appetite since then.  Blood sugars today have been in the 40s which is low for her and she has been drinking orange juice but it does not seem to come up so she presented here for further evaluation.  She said she feels maybe a little confused.  No headache.  No syncope.  No chest pain or shortness of breath.  She does not have significant history of low blood sugars in the past.   The history is provided by the patient.  Hypoglycemia Initial blood sugar:  12 Severity:  Severe Onset quality:  Gradual Duration:  1 day Timing:  Constant Chronicity:  New Diabetic status:  Controlled with oral medications Current diabetic therapy:  Glipizide  Context: decreased oral intake and recent illness   Relieved by:  Nothing Ineffective treatments:  Oral glucose Associated symptoms: no shortness of breath, no speech difficulty, no sweats, no visual change and no vomiting   Risk factors: cancer        Prior to Admission medications   Medication Sig Start Date End Date Taking? Authorizing Provider  Accu-Chek FastClix Lancets MISC Check blood sugar 3x daily 09/28/23   Shamleffer, Ibtehal Jaralla, MD  Accu-Chek Softclix Lancets lancets Check blood sugar 3x daily 10/02/23   Shamleffer, Ibtehal Jaralla, MD  amLODipine-valsartan (EXFORGE) 5-160 MG tablet Take 1 tablet by mouth daily after breakfast. 09/15/20   [provider]  aspirin  EC 81 MG tablet Take 81 mg by mouth daily. Swallow whole.    [provider]  Azelastine  HCl 0.15 % SOLN Place 1-2 sprays into both nostrils daily as needed (sinus congestion/issues.). 03/31/23    Lonn Hicks, MD  Blood Glucose Monitoring Suppl (ACCU-CHEK GUIDE) w/Device KIT Check blood sugars 3x daily 09/28/23   Shamleffer, Ibtehal Jaralla, MD  Blood Glucose Monitoring Suppl (ONETOUCH VERIO REFLECT) w/Device KIT Check blood sugar 3x daily 09/25/23   Shamleffer, Ibtehal Jaralla, MD  calcium  elemental as carbonate (BARIATRIC TUMS ULTRA) 400 MG chewable tablet Chew 2 tablets by mouth daily in the afternoon.    [provider]  diphenhydrAMINE  (BENADRYL ) 25 MG tablet Take 25 mg by mouth daily as needed (runny nose/sinus issues.).    [provider]  ergocalciferol  (VITAMIN D2) 1.25 MG (50000 UT) capsule Take 1 capsule (50,000 Units total) by mouth once a week. Patient taking differently: Take 50,000 Units by mouth 2 (two) times a week. Wednesdays & Saturdays. 01/04/22   Von [place], MD  fexofenadine (ALLEGRA) 180 MG tablet Take 180 mg by mouth daily as needed for allergies or rhinitis.    [provider]  glipiZIDE  (GLUCOTROL ) 5 MG tablet Take 2 tablets (10 mg total) by mouth daily before breakfast AND 1 tablet (5 mg total) daily before supper. 10/11/23   Shamleffer, Ibtehal Jaralla, MD  glucose blood (ACCU-CHEK GUIDE TEST) test strip Check blood sugar 3x daily 09/28/23   Shamleffer, Ibtehal Jaralla, MD  glucose blood (ONETOUCH VERIO) test strip Use as instructed to check blood sugars 3 times a day. 09/25/23   Shamleffer, Ibtehal Jaralla, MD  lenvatinib  10 mg daily dose (LENVIMA , 10 MG DAILY DOSE,)  capsule Take 1 capsule (10 mg total) by mouth daily. HOLD Lenvima  until 8/18-NG 11/03/23   Lonn Hicks, MD  levothyroxine  (SYNTHROID ) 175 MCG tablet Take 1 tablet (175 mcg total) by mouth daily before breakfast. 11/03/23   Lonn Hicks, MD  lidocaine -prilocaine  (EMLA ) cream Apply 1 Application topically daily as needed (prior to port access.). 11/03/23   Lonn Hicks, MD  OneTouch Delica Lancets 33G MISC Check blood sugar 3x daily 09/25/23   Shamleffer, Ibtehal Jaralla, MD  RELION PEN  NEEDLES 32G X 4 MM MISC USE 1 PEN PER DAY 04/23/20   Kassie Mallick, MD  simvastatin  (ZOCOR ) 10 MG tablet Take 10 mg by mouth at bedtime.  07/09/17   [provider]    Allergies: Sulfa antibiotics and Sulfamethoxazole    Review of Systems  Constitutional:  Negative for diaphoresis.  Respiratory:  Negative for shortness of breath.   Gastrointestinal:  Negative for vomiting.  Neurological:  Negative for speech difficulty.    Updated Vital Signs BP (!) 142/92 (BP Location: Right Arm)   Pulse 92   Temp 97.7 F (36.5 C) (Oral)   Resp 18   Wt 85.3 kg   SpO2 100%   BMI 30.34 kg/m   Physical Exam Vitals and nursing note reviewed.  Constitutional:      General: She is not in acute distress.    Appearance: Normal appearance. She is well-developed.  HENT:     Head: Normocephalic and atraumatic.  Eyes:     Conjunctiva/sclera: Conjunctivae normal.  Cardiovascular:     Rate and Rhythm: Normal rate and regular rhythm.     Heart sounds: No murmur heard. Pulmonary:     Effort: Pulmonary effort is normal. No respiratory distress.     Breath sounds: Normal breath sounds. No stridor. No wheezing.  Abdominal:     Palpations: Abdomen is soft.     Tenderness: There is no abdominal tenderness. There is no guarding or rebound.  Musculoskeletal:        General: No deformity.     Cervical back: Neck supple.  Skin:    General: Skin is warm and dry.  Neurological:     General: No focal deficit present.     Mental Status: She is alert and oriented to person, place, and time.     GCS: GCS eye subscore is 4. GCS verbal subscore is 5. GCS motor subscore is 6.     Cranial Nerves: No cranial nerve deficit.     Sensory: No sensory deficit.     Motor: No weakness.     (all labs ordered are listed, but only abnormal results are displayed) Labs Reviewed  CBC WITH DIFFERENTIAL/PLATELET - Abnormal; Notable for the following components:      Result Value   Hemoglobin 11.2 (*)    MCHC 28.7  (*)    All other components within normal limits  COMPREHENSIVE METABOLIC PANEL WITH GFR - Abnormal; Notable for the following components:   CO2 17 (*)    Glucose, Bld 269 (*)    BUN 32 (*)    Creatinine, Ser 1.35 (*)    Total Protein 6.0 (*)    Albumin 3.3 (*)    GFR, Estimated 42 (*)    All other components within normal limits  CBG MONITORING, ED - Abnormal; Notable for the following components:   Glucose-Capillary 14 (*)    All other components within normal limits  CBG MONITORING, ED - Abnormal; Notable for the following components:   Glucose-Capillary 36 (*)  All other components within normal limits  CBG MONITORING, ED - Abnormal; Notable for the following components:   Glucose-Capillary 12 (*)    All other components within normal limits  CBG MONITORING, ED - Abnormal; Notable for the following components:   Glucose-Capillary 113 (*)    All other components within normal limits  CBG MONITORING, ED - Abnormal; Notable for the following components:   Glucose-Capillary 104 (*)    All other components within normal limits  BASIC METABOLIC PANEL WITH GFR  CBG MONITORING, ED    EKG: None  Radiology: No results found.   .Critical Care  Performed by: Towana Ozell BROCKS, MD Authorized by: Towana Ozell BROCKS, MD   Critical care provider statement:    Critical care time (minutes):  30   Critical care was necessary to treat or prevent imminent or life-threatening deterioration of the following conditions:  Endocrine crisis   Critical care was time spent personally by me on the following activities:  Development of treatment plan with patient or surrogate, discussions with consultants, evaluation of patient's response to treatment, examination of patient, ordering and review of laboratory studies, ordering and review of radiographic studies, ordering and performing treatments and interventions, pulse oximetry, re-evaluation of patient's condition and review of old charts     Medications Ordered in the ED  simvastatin  (ZOCOR ) tablet 10 mg (has no administration in time range)  aspirin  EC tablet 81 mg (has no administration in time range)  levothyroxine  (SYNTHROID ) tablet 175 mcg (has no administration in time range)  enoxaparin  (LOVENOX ) injection 40 mg (has no administration in time range)  acetaminophen  (TYLENOL ) tablet 650 mg (has no administration in time range)    Or  acetaminophen  (TYLENOL ) suppository 650 mg (has no administration in time range)  polyethylene glycol (MIRALAX  / GLYCOLAX ) packet 17 g (has no administration in time range)  ondansetron  (ZOFRAN ) tablet 4 mg (has no administration in time range)    Or  ondansetron  (ZOFRAN ) injection 4 mg (has no administration in time range)  albuterol  (PROVENTIL ) (2.5 MG/3ML) 0.083% nebulizer solution 2.5 mg (has no administration in time range)  dextrose  10 % infusion ( Intravenous New Bag/Given 11/07/23 1758)  dextrose  50 % solution 25 g (25 g Intravenous Given 11/07/23 1421)  dextrose  50 % solution 50 mL (50 mLs Intravenous Given 11/07/23 1457)    Clinical Course as of 11/07/23 1819  Tue Nov 07, 2023  1454 Patient's repeat blood sugar still low.  She remains awake and alert. [MB]  [id number] Patient remains awake alert asymptomatic.  Has eaten food and ambulated in the department without any difficulty.  Repeat blood sugar 104.  Still waiting on her chemistries. [MB]  1638 Received sign out from Dr. Towana, presented with low blood sugar. Was as low as 14 here and required multiple amp of D50 and on D10 drip. Patient appears very well and mentation normally. Not on insulin . Has had decreased PO intake  [WS]  1729 Pt well appearing. Stable. Agreeable for admission  [WS]  1755 Patient signed out to hospitalist for admission.  [WS]    Clinical Course User Index [MB] Towana Ozell BROCKS, MD [WS] Francesca Elsie CROME, MD                                 Medical Decision Making Amount and/or Complexity of Data  Reviewed Labs: ordered.  Risk Prescription drug management. Decision regarding hospitalization.   This patient  complains of low blood sugar; this involves an extensive number of treatment Options and is a complaint that carries with it a high risk of complications and morbidity. The differential includes hypoglycemia, dehydration, metabolic derangement  I ordered, reviewed and interpreted labs, which included glucose critically low I ordered medication IV glucose and reviewed PMP when indicated. Previous records obtained and reviewed in epic no similar presentations Cardiac monitoring reviewed, sinus tachycardia Social determinants considered, no significant barriers Critical Interventions: Initiation of IV glucose for symptomatic hypoglycemia  After the interventions stated above, I reevaluated the patient and found patient to be feeling better and blood sugar finally stabilizing Admission and further testing considered, she likely needs admission.  Her care is signed out to Dr. Francesca to follow-up on response of IV glucose.  If she drops again she will clearly need admission.  Otherwise may be able to follow her blood sugars and see if can stabilize.      Final diagnoses:  Hypoglycemia    ED Discharge Orders     None          Towana Ozell BROCKS, MD 11/07/23 [place]

## 2023-11-07 NOTE — ED Notes (Signed)
 Checked pts CBG. Pt was sitting up and talking in no obvious distress at this time. Notified RN and pts result and did a retake CBG. Pt was transferred to room 12.

## 2023-11-07 NOTE — ED Notes (Signed)
 Patient is awake, alert and oriented x 4 with her glucose being this low.  Patient exhibiting no hypoglycemia symptoms.

## 2023-11-08 DIAGNOSIS — N1832 Chronic kidney disease, stage 3b: Secondary | ICD-10-CM | POA: Diagnosis not present

## 2023-11-08 DIAGNOSIS — I10 Essential (primary) hypertension: Secondary | ICD-10-CM | POA: Diagnosis not present

## 2023-11-08 DIAGNOSIS — E039 Hypothyroidism, unspecified: Secondary | ICD-10-CM | POA: Diagnosis not present

## 2023-11-08 DIAGNOSIS — E11649 Type 2 diabetes mellitus with hypoglycemia without coma: Secondary | ICD-10-CM | POA: Diagnosis not present

## 2023-11-08 LAB — BASIC METABOLIC PANEL WITH GFR
Anion gap: 7 (ref 5–15)
BUN: 27 mg/dL — ABNORMAL HIGH (ref 8–23)
CO2: 20 mmol/L — ABNORMAL LOW (ref 22–32)
Calcium: 9.6 mg/dL (ref 8.9–10.3)
Chloride: 114 mmol/L — ABNORMAL HIGH (ref 98–111)
Creatinine, Ser: 1.36 mg/dL — ABNORMAL HIGH (ref 0.44–1.00)
GFR, Estimated: 41 mL/min — ABNORMAL LOW (ref >60)
Glucose, Bld: 104 mg/dL — ABNORMAL HIGH (ref 70–99)
Potassium: 4.2 mmol/L (ref 3.5–5.1)
Sodium: 141 mmol/L (ref 135–145)

## 2023-11-08 LAB — GLUCOSE, CAPILLARY
Glucose-Capillary: 144 mg/dL — ABNORMAL HIGH (ref 70–99)
Glucose-Capillary: 91 mg/dL (ref 70–99)
Glucose-Capillary: 107 mg/dL — ABNORMAL HIGH (ref 70–99)
Glucose-Capillary: 137 mg/dL — ABNORMAL HIGH (ref 70–99)
Glucose-Capillary: 113 mg/dL — ABNORMAL HIGH (ref 70–99)

## 2023-11-08 MED ORDER — CALCIUM CARBONATE ANTACID 500 MG PO CHEW
2.0000 | CHEWABLE_TABLET | Freq: Every day | ORAL | Status: DC
  Administered 2023-11-08: 400 mg via ORAL
  Filled 2023-11-08: qty 2

## 2023-11-08 MED ORDER — VITAMIN D 25 MCG (1000 UNIT) PO TABS
1000.0000 [IU] | ORAL_TABLET | ORAL | Status: DC
  Administered 2023-11-08: 1000 [IU] via ORAL
  Filled 2023-11-08: qty 1

## 2023-11-08 MED ORDER — AZELASTINE HCL 0.1 % NA SOLN: 1.0000 | Freq: Every day | NASAL | Status: DC | PRN

## 2023-11-08 NOTE — Progress Notes (Signed)
   11/08/23 1557  TOC Brief Assessment  Insurance and Status Reviewed  Patient has primary care physician Yes Katheen, Reena, MD)  Home environment has been reviewed From home alone  Prior level of function: Independent  Prior/Current Home Services No current home services  Social Drivers of Health Review SDOH reviewed no interventions necessary  Readmission risk has been reviewed Yes  Transition of care needs no transition of care needs at this time

## 2023-11-08 NOTE — Assessment & Plan Note (Signed)
 Continue Synthroid

## 2023-11-08 NOTE — Care Management Obs Status (Signed)
 MEDICARE OBSERVATION STATUS NOTIFICATION   Patient Details  Name: Krista Johnson MRN: [id number] Date of Birth: [birth year]   Medicare Observation Status Notification Given:  Yes    Toy LITTIE Agar, RN 11/08/2023, 3:45 PM

## 2023-11-08 NOTE — Assessment & Plan Note (Signed)
 Baseline creatinine appears to be approximately 1.3 Creatinine appears to be near baseline Strict input and output monitoring Monitoring renal function and electrolytes with serial chemistries.

## 2023-11-08 NOTE — Plan of Care (Signed)

## 2023-11-08 NOTE — Progress Notes (Signed)
 PROGRESS NOTE   Krista Johnson  FMW:[id number] DOB: 1951-02-20 DOA: 11/07/2023 PCP: Verena Reena, MD   Date of Service: the patient was seen and examined on 11/08/2023  Brief Narrative:  73 y.o. female with medical history significant of endometrial cancer s/p TAH w/ bilateral oophorectomy s/p chemo/radiation, HTN, DM2, hypothyroidism, acquired pancytopenia who presented to [facility] ED on 7/22 with complaints of generalized weakness and fatigue as well as a fingerstick glucose of 41 on the morning of presentation.    Of note, patient was recently seen by endocrinology on 6/25 when there was a ncrease in glipizide  from 2.5 mg p.o. twice daily to 10 mg p.o. every morning and 5 mg p.o. every afternoon.    Upon evaluation in the emergency department patient had persisting hypoglycemia and therefore was initiated on a D10 infusion.  The hospitalist group was then called to assess the patient for admission hospital.     Assessment & Plan Uncontrolled type 2 diabetes mellitus with hypoglycemia, without long-term current use of insulin  (HCC) Patient is very susceptible to glipizide  induced hypoglycemia due to chronic kidney disease Glipizide  is currently being held Attempting to wean patient off D10 infusion Encouraging oral intake Monitoring Accu-Cheks closely every 4 hours Patient will likely need a different class of oral hypoglycemic at time of discharge Essential hypertension History of hypertension however patient is normotensive without AnHypertensive therapy Hypothyroidism Continue Synthroid  Chronic kidney disease, stage 3b (HCC) Baseline creatinine appears to be approximately 1.3 Creatinine appears to be near baseline Strict input and output monitoring Monitoring renal function and electrolytes with serial chemistries.    Subjective:  Patient reports good tolerance of oral intake.  Patient denies weakness, lightheadedness or confusion.  Physical Exam:  Vitals:   11/07/23 2000  11/07/23 2215 11/08/23 0212 11/08/23 0602  BP: 106/89 (!) 149/77 120/66 103/71  Pulse: (!) 54 98 93 91  Resp: 16 18 18 18   Temp: 97.8 F (36.6 C) 98.8 F (37.1 C) 98.5 F (36.9 C) 98.5 F (36.9 C)  TempSrc:  Oral Oral Oral  SpO2: 98% 100% 100% 100%  Weight:      Height:        Constitutional: Awake alert and oriented x3, no associated distress.   Skin: no rashes, no lesions, good skin turgor noted. Eyes: Pupils are equally reactive to light.  No evidence of scleral icterus or conjunctival pallor.  ENMT: Moist mucous membranes noted.  Posterior pharynx clear of any exudate or lesions.   Respiratory: clear to auscultation bilaterally, no wheezing, no crackles. Normal respiratory effort. No accessory muscle use.  Cardiovascular: Regular rate and rhythm, no murmurs / rubs / gallops. No extremity edema. 2+ pedal pulses. No carotid bruits.  Abdomen: Abdomen is soft and nontender.  No evidence of intra-abdominal masses.  Positive bowel sounds noted in all quadrants.   Musculoskeletal: No joint deformity upper and lower extremities. Good ROM, no contractures. Normal muscle tone.    Data Reviewed:  I have personally reviewed and interpreted labs, imaging.  Significant findings are   CBC: Recent Labs  Lab 11/03/23 0828 11/07/23 1416  WBC 5.3 6.6  NEUTROABS 3.8 3.6  HGB 10.3* 11.2*  HCT 32.2* 39.0  MCV 89.7 98.5  PLT 147* 168   Basic Metabolic Panel: Recent Labs  Lab 11/03/23 0828 11/07/23 1600 11/08/23 0626  NA 140 136 141  K 4.4 3.9 4.2  CL 114* 108 114*  CO2 22 17* 20*  GLUCOSE 148* 269* 104*  BUN 35* 32* 27*  CREATININE  1.44* 1.35* 1.36*  CALCIUM  9.9 9.7 9.6   GFR: Estimated Creatinine Clearance: 41.1 mL/min (A) (by C-G formula based on SCr of 1.36 mg/dL (H)). Liver Function Tests: Recent Labs  Lab 11/03/23 0828 11/07/23 1600  AST 17 25  ALT 23 33  ALKPHOS 98 82  BILITOT 0.4 0.4  PROT 6.1* 6.0*  ALBUMIN 3.5 3.3*       Code Status:  Full code.  Code  status decision has been confirmed with: patient    Severity of Illness:  The appropriate patient status for this patient is OBSERVATION. Observation status is judged to be reasonable and necessary in order to provide the required intensity of service to ensure the patient's safety. The patient's presenting symptoms, physical exam findings, and initial radiographic and laboratory data in the context of their medical condition is felt to place them at decreased risk for further clinical deterioration. Furthermore, it is anticipated that the patient will be medically stable for discharge from the hospital within 2 midnights of admission.   Time spent:  40 minutes  Author:  Zachary JINNY Ba MD  11/08/2023 9:07 AM

## 2023-11-08 NOTE — Assessment & Plan Note (Signed)
 Patient is very susceptible to glipizide  induced hypoglycemia due to chronic kidney disease Glipizide  is currently being held Attempting to wean patient off D10 infusion Encouraging oral intake Monitoring Accu-Cheks closely every 4 hours Patient will likely need a different class of oral hypoglycemic at time of discharge

## 2023-11-08 NOTE — Progress Notes (Signed)
 Mobility Specialist - Progress Note   11/08/23 1159  Mobility  Activity Ambulated independently in hallway  Level of Assistance Independent  Assistive Device None  Distance Ambulated (ft) 500 ft  Activity Response Tolerated well  Mobility Referral Yes  Mobility visit 1 Mobility  Mobility Specialist Start Time (ACUTE ONLY) 1120   Pt received in bed and agreeable to mobility. No complaints during session. Pt to bed after session with all needs met.    [facility]

## 2023-11-08 NOTE — Plan of Care (Signed)
  Problem: Education: Goal: Knowledge of General Education information will improve Description: Including pain rating scale, medication(s)/side effects and non-pharmacologic comfort measures Outcome: Progressing   Problem: Clinical Measurements: Goal: Ability to maintain clinical measurements within normal limits will improve Outcome: Progressing   Problem: Coping: Goal: Level of anxiety will decrease Outcome: Progressing   

## 2023-11-08 NOTE — Assessment & Plan Note (Signed)
 History of hypertension however patient is normotensive without AnHypertensive therapy

## 2023-11-08 NOTE — Hospital Course (Addendum)
 73 y.o. female with medical history significant of endometrial cancer s/p TAH w/ bilateral oophorectomy s/p chemo/radiation, HTN, DM2, hypothyroidism, acquired pancytopenia who presented to [facility] ED on 7/22 with complaints of generalized weakness and fatigue as well as a fingerstick glucose of 41 on the morning of presentation.    Of note, patient was recently seen by endocrinology on 6/25 when there was a ncrease in glipizide  from 2.5 mg p.o. twice daily to 10 mg p.o. every morning and 5 mg p.o. every afternoon.    Upon evaluation in the emergency department patient had persisting hypoglycemia and therefore was initiated on a D10 infusion.  The hospitalist group was then called to assess the patient for admission hospital.    In the days that followed patient's hypoglycemia had resolved.  Patient's D10 infusion was slowly weaned and patient maintained normoglycemia.  Reassessment were made for the patient to be discharged home in improved and stable condition on 11/09/2023.  Patient was placed on a reduced regimen of glipizide  2.5 mg twice daily going forward and advised to follow-up closely with her endocrinologist for further guidance.  Patient was also instructed to monitor blood sugars closely in the home setting.

## 2023-11-09 ENCOUNTER — Encounter: Payer: Self-pay | Admitting: Hematology and Oncology

## 2023-11-09 ENCOUNTER — Other Ambulatory Visit (HOSPITAL_COMMUNITY): Payer: Self-pay

## 2023-11-09 DIAGNOSIS — E039 Hypothyroidism, unspecified: Secondary | ICD-10-CM | POA: Diagnosis not present

## 2023-11-09 DIAGNOSIS — N1832 Chronic kidney disease, stage 3b: Secondary | ICD-10-CM | POA: Diagnosis not present

## 2023-11-09 DIAGNOSIS — E11649 Type 2 diabetes mellitus with hypoglycemia without coma: Secondary | ICD-10-CM | POA: Diagnosis not present

## 2023-11-09 DIAGNOSIS — I10 Essential (primary) hypertension: Secondary | ICD-10-CM | POA: Diagnosis not present

## 2023-11-09 LAB — GLUCOSE, CAPILLARY
Glucose-Capillary: 110 mg/dL — ABNORMAL HIGH (ref 70–99)
Glucose-Capillary: 117 mg/dL — ABNORMAL HIGH (ref 70–99)
Glucose-Capillary: 121 mg/dL — ABNORMAL HIGH (ref 70–99)

## 2023-11-09 MED ORDER — LINAGLIPTIN 5 MG PO TABS
5.0000 mg | ORAL_TABLET | Freq: Every day | ORAL | Status: DC
  Administered 2023-11-09: 5 mg via ORAL
  Filled 2023-11-09: qty 1

## 2023-11-09 MED ORDER — LINAGLIPTIN 5 MG PO TABS
5.0000 mg | ORAL_TABLET | Freq: Every day | ORAL | 2 refills | Status: DC
  Filled 2023-11-09: qty 30, 30d supply, fill #0

## 2023-11-09 MED ORDER — GLIPIZIDE 5 MG PO TABS: 2.5000 mg | ORAL_TABLET | Freq: Two times a day (BID) | ORAL | Status: DC

## 2023-11-09 MED ORDER — GLIPIZIDE 5 MG PO TABS
2.5000 mg | ORAL_TABLET | Freq: Two times a day (BID) | ORAL | 2 refills | Status: DC
  Filled 2023-11-09: qty 30, 30d supply, fill #0

## 2023-11-09 NOTE — Discharge Instructions (Signed)
 Please take all prescribed medications exactly as instructed including reducing your Glipizide  to 2.5mg  twice daily. Please consume a low carbohydrate diet diet Please increase your physical activity as tolerated. Please maintain all outpatient follow-up appointments including follow-up with your primary care provider and your endocrinologist Please return to the emergency department if you develop worsening low blood sugars that don't come up with juice, weakness or inability to tolerate oral intake.

## 2023-11-09 NOTE — Plan of Care (Signed)

## 2023-11-09 NOTE — Progress Notes (Signed)
 Patient informed that the pharmacy were on their way to deliver the Glipizide  ordered. However patient declined the delivery stating that she is already has  the medication at home , she recently refilled and its in 5 mg which she will cut into two to take.  Pharmacy made aware and order cancelled. Dr Kenard also made aware. Patient D/ced

## 2023-11-10 ENCOUNTER — Ambulatory Visit (INDEPENDENT_AMBULATORY_CARE_PROVIDER_SITE_OTHER): Admitting: Internal Medicine

## 2023-11-10 ENCOUNTER — Telehealth: Payer: Self-pay | Admitting: Internal Medicine

## 2023-11-10 VITALS — BP 122/70 | HR 72 | Ht 66.0 in | Wt 184.0 lb

## 2023-11-10 DIAGNOSIS — Z794 Long term (current) use of insulin: Secondary | ICD-10-CM | POA: Diagnosis not present

## 2023-11-10 DIAGNOSIS — N1831 Chronic kidney disease, stage 3a: Secondary | ICD-10-CM

## 2023-11-10 DIAGNOSIS — E1122 Type 2 diabetes mellitus with diabetic chronic kidney disease: Secondary | ICD-10-CM

## 2023-11-10 DIAGNOSIS — E1142 Type 2 diabetes mellitus with diabetic polyneuropathy: Secondary | ICD-10-CM | POA: Diagnosis not present

## 2023-11-10 MED ORDER — EMPAGLIFLOZIN 10 MG PO TABS: 10.0000 mg | ORAL_TABLET | Freq: Every day | ORAL | Status: DC

## 2023-11-10 NOTE — Telephone Encounter (Signed)
 Patient dropped off patient assistance paper work today 11/10/23 for Dr.Shamleffer to fill out.

## 2023-11-10 NOTE — Patient Instructions (Signed)
 STOP Glipizide   Start Jardiance 10 mg, 1 tablet every morning   HOW TO TREAT LOW BLOOD SUGARS (Blood sugar LESS THAN 70 MG/DL) Please follow the RULE OF 15 for the treatment of hypoglycemia treatment (when your (blood sugars are less than 70 mg/dL)   STEP 1: Take 15 grams of carbohydrates when your blood sugar is low, which includes:  3-4 GLUCOSE TABS  OR 3-4 OZ OF JUICE OR REGULAR SODA OR ONE TUBE OF GLUCOSE GEL    STEP 2: RECHECK blood sugar in 15 MINUTES STEP 3: If your blood sugar is still low at the 15 minute recheck --> then, go back to STEP 1 and treat AGAIN with another 15 grams of carbohydrates.

## 2023-11-10 NOTE — Progress Notes (Signed)
 Name: Krista Johnson  Age/ Sex: 73 y.o., female   MRN/ DOB: [id number], [birth year]     PCP: Verena Reena, MD   Reason for Endocrinology Evaluation: Type 2 Diabetes Mellitus  Initial Endocrine Consultative Visit: 12/26/2017    PATIENT IDENTIFIER: Krista Johnson is a 73 y.o. female with a past medical history of DM, HTN, hypothyroid, CKD, Hx endometrial cancer. The patient has followed with Endocrinology clinic since 12/26/2017 for consultative assistance with management of her diabetes.  DIABETIC HISTORY:  Krista Johnson was diagnosed with DM 2004, Krista Johnson was on insulin  between 2019-2022 while on chemotherapy. Her hemoglobin A1c has ranged from 5.8% in 2022, peaking at 14.9% in 2019. Krista Johnson was following up with Dr. Kassie, subsequently saw Dr. Von 09/2021  Jardiance caused constipation      SUBJECTIVE:   During the last visit (06/13/2023): A1c 6.5%  Today (11/10/2023): Krista Johnson is here for follow-up on diabetes management.  Krista Johnson checks her blood sugars occasionally . The patient has  had hypoglycemic episodes since the last clinic visit. The patient is symptomatic with these episodes.  Patient follows with oncology for endometrial cancer, Krista Johnson is on Keytruda  infusions Q 6 weeks   Pt had a follow up with podiatry 08/09/2023  In reviewing her history, the patient has been noted with elevated TSH, this has been attributed to Keytruda  and oncology her adjusted levothyroxine  dose   The patient had developed acute gastritis with vomiting Saturday night after eating out, overnight Krista Johnson did develop hypoglycemia with a BG reading of 41 Mg/DL, the patient continued to attempt to correct hypoglycemia by consuming food and drinking juice but also continued to have the vomiting.  Unfortunately Krista Johnson also took glipizide  during this time. Patient noted decreased appetite through Monday and Tuesday Krista Johnson was feeling shaky on Tuesday and that is when Krista Johnson presented to the ED 11/07/2023 with hypoglycemia with  a BG reading of 14 Mg/DL.  The patient was kept under observation and was discharged yesterday  Krista Johnson has been discharged on glipizide  5 mg, half a tablet before breakfast and half a tablet before supper GI symptoms have resolved   HOME DIABETES REGIMEN:  Glipizide  5 mg ,half a tablet before Breakfast     Statin: Yes ACE-I/ARB: no  METER DOWNLOAD SUMMARY: 90 day average 119 mg/dL  61-869 mg/dL     DIABETIC COMPLICATIONS: Microvascular complications:  CKD III Denies:  Last Eye Exam: Completed   Macrovascular complications:   Denies: CAD, CVA, PVD   HISTORY:  Past Medical History:  Past Medical History:  Diagnosis Date   Arthritis    right knee--- last cortisone injection 04/ 2019   CKD (chronic kidney disease)    Diabetes mellitus without complication [facility])    endocrinologist-  dr kassie   Endometrial cancer [facility])    History of colon polyps    Hyperlipidemia    Hyperparathyroidism (HCC)    Hypertension    Past Surgical History:  Past Surgical History:  Procedure Laterality Date   AXILLARY LYMPH NODE BIOPSY Right 04/22/2022   Procedure: RIGHT AXILLARY LYMPH NODE BIOPSY;  Surgeon: Lyndel Deward PARAS, MD;  Location: [facility] OR;  Service: General;  Laterality: Right;   COLONOSCOPY  last one 12-25-2017   IR IMAGING GUIDED PORT INSERTION  02/20/2018   ROBOTIC ASSISTED TOTAL HYSTERECTOMY WITH BILATERAL SALPINGO OOPHERECTOMY N/A 01/23/2018   Procedure: XI ROBOTIC ASSISTED TOTAL HYSTERECTOMY WITH BILATERAL SALPINGO OOPHORECTOMY;  Surgeon: Eloy Herring, MD;  Location: [facility] ORS;  Service: Gynecology;  Laterality: N/A;  SENTINEL NODE BIOPSY N/A 01/23/2018   Procedure: SENTINEL NODE BIOPSY;  Surgeon: Eloy Herring, MD;  Location: [facility] ORS;  Service: Gynecology;  Laterality: N/A;   Social History:  reports that Krista Johnson has never smoked. Krista Johnson has never used smokeless tobacco. Krista Johnson reports that Krista Johnson does not currently use alcohol. Krista Johnson reports that Krista Johnson does not use drugs. Family History:  Family  History  Problem Relation Age of Onset   Diabetes Mother    Hypertension Mother    Diabetes Sister    Hypertension Sister    Diabetes Maternal Uncle    Colon cancer Paternal Aunt    Diabetes Paternal Aunt    Stomach cancer Neg Hx    Rectal cancer Neg Hx    Esophageal cancer Neg Hx    Colon polyps Neg Hx      HOME MEDICATIONS: Allergies as of 11/10/2023       Reactions   Sulfa Antibiotics Nausea Only   Atenolol  Other (See Comments)   Dropped B/P too much        Medication List        Accurate as of November 10, 2023 11:29 AM. If you have any questions, ask your nurse or doctor.          aspirin  EC 81 MG tablet Take 81 mg by mouth daily. Swallow whole.   Azelastine  HCl 0.15 % Soln Place 1-2 sprays into both nostrils daily as needed (sinus congestion/issues.).   calcium  elemental as carbonate 400 MG chewable tablet Commonly known as: BARIATRIC TUMS ULTRA Chew 2 tablets by mouth daily in the afternoon.   glipiZIDE  5 MG tablet Commonly known as: GLUCOTROL  Take 0.5 tablets (2.5 mg total) by mouth 2 (two) times daily before a meal.   levothyroxine  175 MCG tablet Commonly known as: SYNTHROID  Take 1 tablet (175 mcg total) by mouth daily before breakfast.   simvastatin  10 MG tablet Commonly known as: ZOCOR  Take 10 mg by mouth at bedtime.   VITAMIN D -3 PO Take 1 capsule by mouth every other day.         OBJECTIVE:   Vital Signs: There were no vitals taken for this visit.  Wt Readings from Last 3 Encounters:  11/07/23 188 lb (85.3 kg)  11/03/23 188 lb (85.3 kg)  10/11/23 186 lb (84.4 kg)     Exam: General: Pt appears well and is in NAD  Neck: General: Supple without adenopathy. Thyroid :  No goiter or nodules appreciated.   Lungs: Clear with good BS bilat   Heart: RRR   Extremities: Trace pretibial edema.   Neuro: MS is good with appropriate affect, pt is alert and Ox3    DM foot exam: 08/09/2023 per podiatry        DATA REVIEWED:  Lab  Results  Component Value Date   HGBA1C 7.1 (A) 10/11/2023   HGBA1C 6.5 (A) 06/13/2023   HGBA1C 6.8 08/09/2022     Latest Reference Range & Units 11/08/23 06:26  Sodium 135 - 145 mmol/L 141  Potassium 3.5 - 5.1 mmol/L 4.2  Chloride 98 - 111 mmol/L 114 (H)  CO2 22 - 32 mmol/L 20 (L)  Glucose 70 - 99 mg/dL 895 (H)  BUN 8 - 23 mg/dL 27 (H)  Creatinine 9.55 - 1.00 mg/dL 8.63 (H)  Calcium  8.9 - 10.3 mg/dL 9.6  Anion gap 5 - 15  7  GFR, Estimated >60 mL/min 41 (L)  (H): Data is abnormally high (L): Data is abnormally low  Old records , labs and  images have been reviewed.    ASSESSMENT / PLAN / RECOMMENDATIONS:   1) Type 2 Diabetes Mellitus, Sub-Optimally controlled, With CKD III complications - Most recent A1c of 7.1 %. Goal A1c < 7.0 %.     -Unfortunately, Krista Johnson developed severe hypoglycemia while on glipizide , the patient continues to take regular doses of glipizide  despite vomiting -I have recommended discontinuing glipizide , historically Krista Johnson has been cost conscious and wanted to affordable medications -I did recommend Jardiance, the patient will need to apply for patient assistance program, Krista Johnson was provided Jardiance 10 mg dose samples -I have cautioned the patient against genital infections and UTI with SGLT2 inhibitors -I have encouraged her to stay hydrated and to contact us  with any side effects  MEDICATIONS:  STOP glipizide  5 mg Start Jardiance 10 mg daily   EDUCATION / INSTRUCTIONS: BG monitoring instructions: Patient is instructed to check her blood sugars 1 times a day. Call [facility] if: BG persistently < 70  I reviewed the Rule of 15 for the treatment of hypoglycemia in detail with the patient. Literature supplied.   2) Diabetic complications:  Eye: Does not have known diabetic retinopathy.  Neuro/ Feet: Does  have known diabetic peripheral neuropathy .  Renal: Patient does  have known baseline CKD. Krista Johnson   is  on an ACEI/ARB at present.      F/U in 3 months  I spent 25 minutes preparing to see the patient by review of recent labs, imaging and procedures, obtaining and reviewing separately obtained history, communicating with the patient, ordering medications, tests or procedures, and documenting clinical information in the EHR including the differential Dx, treatment, and any further evaluation and other management    Signed electronically by: Stefano Redgie Butts, MD  [facility] Endocrinology  [facility] [address] [place], [place] [address] Phone: [phone] FAX: [phone]   CC: Verena Mems, MD [address] [place] [address] Phone: [phone]  Fax: [phone]  Return to Endocrinology clinic as below: Future Appointments  Date Time Provider Department Center  11/10/2023 11:30 AM Joleena Weisenburger, Donell Redgie, MD [facility] None  12/14/2023 10:15 AM [facility] FLUSH [facility] None  12/14/2023 10:40 AM Lonn Hicks, MD [facility] None  12/14/2023 11:00 AM [facility] INFUSION [facility] None  02/07/2024  9:45 AM Gaynel Delon CROME, DPM [facility]  02/09/2024  8:30 AM Deiondra Denley, Donell Redgie, MD [facility] None

## 2023-11-10 NOTE — Addendum Note (Signed)
 Addended by: OCTAVIO DIETRICH CROME on: 11/10/2023 02:11 PM   Modules accepted: Orders

## 2023-11-11 NOTE — Discharge Summary (Signed)
 Physician Discharge Summary   Patient: Krista Johnson MRN: [id number] DOB: Apr 08, 1951  Admit date:     11/07/2023  Discharge date: 11/09/2023  Discharge Physician: Zachary JINNY Ba   PCP: Verena Reena, MD   Recommendations at discharge:   Please take all prescribed medications exactly as instructed including reducing your Glipizide  to 2.5mg  twice daily. Please consume a low carbohydrate diet diet Please increase your physical activity as tolerated. Please maintain all outpatient follow-up appointments including follow-up with your primary care provider and your endocrinologist Please return to the emergency department if you develop worsening low blood sugars that don't come up with juice, weakness or inability to tolerate oral intake.  Discharge Diagnoses: Principal Problem:   Hypoglycemia Active Problems:   Chronic kidney disease, stage 3b (HCC)   Essential hypertension   Hypothyroidism   Uncontrolled type 2 diabetes mellitus with hypoglycemia, without long-term current use of insulin  (HCC)  Resolved Problems:   * No resolved hospital problems. *   Hospital Course: 73 y.o. female with medical history significant of endometrial cancer s/p TAH w/ bilateral oophorectomy s/p chemo/radiation, HTN, DM2, hypothyroidism, acquired pancytopenia who presented to [facility] ED on 7/22 with complaints of generalized weakness and fatigue as well as a fingerstick glucose of 41 on the morning of presentation.    Of note, patient was recently seen by endocrinology on 6/25 when there was a ncrease in glipizide  from 2.5 mg p.o. twice daily to 10 mg p.o. every morning and 5 mg p.o. every afternoon.    Upon evaluation in the emergency department patient had persisting hypoglycemia and therefore was initiated on a D10 infusion.  The hospitalist group was then called to assess the patient for admission hospital.    In the days that followed patient's hypoglycemia had resolved.  Patient's D10 infusion was  slowly weaned and patient maintained normoglycemia.  Reassessment were made for the patient to be discharged home in improved and stable condition on 11/09/2023.  Patient was placed on a reduced regimen of glipizide  2.5 mg twice daily going forward and advised to follow-up closely with her endocrinologist for further guidance.  Patient was also instructed to monitor blood sugars closely in the home setting.      Consultants: none Procedures performed: none  Disposition: Home Diet recommendation:  Discharge Diet Orders (From admission, onward)     Start     Ordered   11/09/23 0000  Diet Carb Modified        11/09/23 0950           Carb modified diet  DISCHARGE MEDICATION: Allergies as of 11/09/2023       Reactions   Sulfa Antibiotics Nausea Only   Atenolol  Other (See Comments)   Dropped B/P too much        Medication List     TAKE these medications    aspirin  EC 81 MG tablet Take 81 mg by mouth daily. Swallow whole.   Azelastine  HCl 0.15 % Soln Place 1-2 sprays into both nostrils daily as needed (sinus congestion/issues.).   calcium  elemental as carbonate 400 MG chewable tablet Commonly known as: BARIATRIC TUMS ULTRA Chew 2 tablets by mouth daily in the afternoon.   levothyroxine  175 MCG tablet Commonly known as: SYNTHROID  Take 1 tablet (175 mcg total) by mouth daily before breakfast.   simvastatin  10 MG tablet Commonly known as: ZOCOR  Take 10 mg by mouth at bedtime.   VITAMIN D -3 PO Take 1 capsule by mouth every other day.  Follow-up Information     Verena Mems, MD. Schedule an appointment as soon as possible for a visit in 1 week(s).   Specialty: Family Medicine Contact information: [address] [place] [address] [phone]         Shamleffer, Donell Cardinal, MD. Schedule an appointment as soon as possible for a visit.   Specialties: Endocrinology, Radiology Contact information: [address]  [place] [place] [address] [phone]                 Discharge Exam: Fredricka Weights   11/07/23 1406 11/07/23 1420  Weight: 85.3 kg 85.3 kg    Constitutional: Awake alert and oriented x3, no associated distress.   Respiratory: clear to auscultation bilaterally, no wheezing, no crackles. Normal respiratory effort. No accessory muscle use.  Cardiovascular: Regular rate and rhythm, no murmurs / rubs / gallops. No extremity edema. 2+ pedal pulses. No carotid bruits.  Abdomen: Abdomen is soft and nontender.  No evidence of intra-abdominal masses.  Positive bowel sounds noted in all quadrants.   Musculoskeletal: No joint deformity upper and lower extremities. Good ROM, no contractures. Normal muscle tone.     Condition at discharge: fair  The results of significant diagnostics from this hospitalization (including imaging, microbiology, ancillary and laboratory) are listed below for reference.   Imaging Studies: No results found.  Microbiology: Results for orders placed or performed in visit on 05/02/19  Novel Coronavirus, NAA ([facility])     Status: None   Collection Time: 05/02/19  4:39 PM   Specimen: Nasopharyngeal(NP) swabs in vial transport medium   NASOPHARYNGE  TESTING  Result Value Ref Range Status   SARS-CoV-2, NAA Not Detected Not Detected Final    Comment: This nucleic acid amplification test was developed and its performance characteristics determined by [employer]. Nucleic acid amplification tests include PCR and TMA. This test has not been FDA cleared or approved. This test has been authorized by FDA under an Emergency Use Authorization (EUA). This test is only authorized for the duration of time the declaration that circumstances exist justifying the authorization of the emergency use of in vitro diagnostic tests for detection of SARS-CoV-2 virus and/or diagnosis of COVID-19 infection under section 564(b)(1) of the Act, 21 U.S.C. 639aaa-6(a)  (1), unless the authorization is terminated or revoked sooner. When diagnostic testing is negative, the possibility of a false negative result should be considered in the context of a patient's recent exposures and the presence of clinical signs and symptoms consistent with COVID-19. An individual without symptoms of COVID-19 and who is not shedding SARS-CoV-2 virus would  expect to have a negative (not detected) result in this assay.     Labs: CBC: Recent Labs  Lab 11/07/23 1416  WBC 6.6  NEUTROABS 3.6  HGB 11.2*  HCT 39.0  MCV 98.5  PLT 168   Basic Metabolic Panel: Recent Labs  Lab 11/07/23 1600 11/08/23 0626  NA 136 141  K 3.9 4.2  CL 108 114*  CO2 17* 20*  GLUCOSE 269* 104*  BUN 32* 27*  CREATININE 1.35* 1.36*  CALCIUM  9.7 9.6   Liver Function Tests: Recent Labs  Lab 11/07/23 1600  AST 25  ALT 33  ALKPHOS 82  BILITOT 0.4  PROT 6.0*  ALBUMIN 3.3*   CBG: Recent Labs  Lab 11/08/23 1649 11/08/23 2003 11/09/23 0012 11/09/23 0412 11/09/23 0723  GLUCAP 137* 113* 121* 117* 110*    Discharge time spent: greater than 30 minutes.  Signed:  Zachary JINNY Ba, MD [facility] 11/11/2023

## 2023-11-12 NOTE — Progress Notes (Signed)
  Subjective:  Patient ID: Krista Johnson, female    DOB: 11-25-1950,  MRN: [id number]  Krista Johnson presents to clinic today for at risk foot care with h/o neuropathy secondary to chemotherapy and painful thick toenails that are difficult to trim. Pain interferes with ambulation. Aggravating factors include wearing enclosed shoe gear. Pain is relieved with periodic professional debridement.  Chief Complaint  Patient presents with   Diabetes    [facility] toenail trim. A1C 7.1 NIDDM. LOV with PCP 01/2023.   New problem(s): None.   PCP is Verena Reena, MD.  Allergies  Allergen Reactions   Sulfa Antibiotics Nausea Only   Atenolol  Other (See Comments)    Dropped B/P too much    Review of Systems: Negative except as noted in the HPI.  Objective: No changes noted in today's physical examination. There were no vitals filed for this visit. Krista Johnson is a pleasant 73 y.o. female WD, WN in NAD. AAO x 3.  Vascular Examination: Capillary refill time immediate b/l. Vascular status intact b/l with palpable pedal pulses. Pedal hair present b/l. No pain with calf compression b/l. Skin temperature gradient WNL b/l. No cyanosis or clubbing b/l. No ischemia or gangrene noted b/l. Trace edema noted BLE.  Neurological Examination:  Pt has subjective symptoms of neuropathy. Protective sensation intact 5/5 intact bilaterally with 10g monofilament b/l.  Dermatological Examination: Pedal skin with normal turgor, texture and tone b/l.  No open wounds. No interdigital macerations.   Toenails 1-5 b/l thick, discolored, elongated with subungual debris and pain on dorsal palpation.   Hyperkeratotic lesion(s) left heel.  No erythema, no edema, no drainage, no fluctuance.  Musculoskeletal Examination: Muscle strength 5/5 to all lower extremity muscle groups bilaterally. Hammertoe(s) 2-5 b/l.Krista Johnson No pain, crepitus or joint limitation noted with ROM b/l LE.  Patient ambulates independently without assistive  aids.  Radiographs: None  Last A1c:      Latest Ref Rng & Units 10/11/2023    9:39 AM 06/13/2023    9:02 AM  Hemoglobin A1C  Hemoglobin-A1c 4.0 - 5.6 % 7.1  6.5    Assessment/Plan: 1. Pain due to onychomycosis of toenails of both feet   2. Callus   3. Chemotherapy-induced neuropathy (HCC)   4. Diabetes mellitus due to underlying condition with stage 3 chronic kidney disease, with long-term current use of insulin , unspecified whether stage 3a or 3b CKD (HCC)   Patient was evaluated and treated. All patient's and/or POA's questions/concerns addressed on today's visit. Toenails 1-5 debrided in length and girth without incident. Callus(es) left heel pared with sharp debridement without incident. Continue daily foot inspections and monitor blood glucose per PCP/Endocrinologist's recommendations. Continue soft, supportive shoe gear daily. Report any pedal injuries to medical professional. Call office if there are any questions/concerns.  Return in about 3 months (around 02/07/2024).  Krista Johnson, DPM      [facility] LOCATION: [address], [place] [address]                   Office [phone]   [facility] LOCATION: [address] [place], [place] [address] Office [phone]

## 2023-11-15 DIAGNOSIS — E1169 Type 2 diabetes mellitus with other specified complication: Secondary | ICD-10-CM | POA: Diagnosis not present

## 2023-11-15 DIAGNOSIS — Z09 Encounter for follow-up examination after completed treatment for conditions other than malignant neoplasm: Secondary | ICD-10-CM | POA: Diagnosis not present

## 2023-11-16 DIAGNOSIS — H2512 Age-related nuclear cataract, left eye: Secondary | ICD-10-CM | POA: Diagnosis not present

## 2023-11-17 DIAGNOSIS — H2511 Age-related nuclear cataract, right eye: Secondary | ICD-10-CM | POA: Diagnosis not present

## 2023-11-17 DIAGNOSIS — H25011 Cortical age-related cataract, right eye: Secondary | ICD-10-CM | POA: Diagnosis not present

## 2023-11-17 DIAGNOSIS — H25041 Posterior subcapsular polar age-related cataract, right eye: Secondary | ICD-10-CM | POA: Diagnosis not present

## 2023-11-23 DIAGNOSIS — N289 Disorder of kidney and ureter, unspecified: Secondary | ICD-10-CM | POA: Diagnosis not present

## 2023-11-24 ENCOUNTER — Telehealth: Payer: Self-pay

## 2023-11-24 MED ORDER — EMPAGLIFLOZIN 25 MG PO TABS: 25.0000 mg | ORAL_TABLET | Freq: Every day | ORAL | 3 refills | Status: DC

## 2023-11-24 NOTE — Telephone Encounter (Signed)
 Patient states that she has been taking the Jardiance  for about 2 weeks and  she notice that her blood sugars are higher when she takes this medication in the 200 range non-fasting and she is watching what she eats.

## 2023-11-24 NOTE — Telephone Encounter (Signed)
 Pt has been notified and voices understanding.

## 2023-11-27 ENCOUNTER — Telehealth: Payer: Self-pay

## 2023-11-27 MED ORDER — GLIMEPIRIDE 1 MG PO TABS: 1.0000 mg | ORAL_TABLET | Freq: Every day | ORAL | 3 refills | Status: DC

## 2023-11-27 NOTE — Telephone Encounter (Signed)
 Patient unable to afford the cost of the Jardiance .

## 2023-11-27 NOTE — Telephone Encounter (Signed)
 Patient just feels that the Jardiance  medication is not working and would like to change medication. Jardiance  also gives her constipation.

## 2023-11-27 NOTE — Telephone Encounter (Signed)
 Patient advised and verbalized understanding medication change and directions.

## 2023-11-30 DIAGNOSIS — H25041 Posterior subcapsular polar age-related cataract, right eye: Secondary | ICD-10-CM | POA: Diagnosis not present

## 2023-11-30 DIAGNOSIS — H2511 Age-related nuclear cataract, right eye: Secondary | ICD-10-CM | POA: Diagnosis not present

## 2023-11-30 DIAGNOSIS — H25011 Cortical age-related cataract, right eye: Secondary | ICD-10-CM | POA: Diagnosis not present

## 2023-12-14 ENCOUNTER — Ambulatory Visit: Payer: Self-pay | Admitting: Hematology and Oncology

## 2023-12-14 ENCOUNTER — Inpatient Hospital Stay: Attending: Gynecology

## 2023-12-14 ENCOUNTER — Encounter: Payer: Self-pay | Admitting: Hematology and Oncology

## 2023-12-14 ENCOUNTER — Inpatient Hospital Stay

## 2023-12-14 ENCOUNTER — Other Ambulatory Visit: Payer: Self-pay

## 2023-12-14 ENCOUNTER — Inpatient Hospital Stay (HOSPITAL_BASED_OUTPATIENT_CLINIC_OR_DEPARTMENT_OTHER): Admitting: Hematology and Oncology

## 2023-12-14 VITALS — Temp 97.5°F

## 2023-12-14 VITALS — BP 159/95 | HR 86 | Resp 18 | Ht 66.0 in | Wt 180.2 lb

## 2023-12-14 DIAGNOSIS — C541 Malignant neoplasm of endometrium: Secondary | ICD-10-CM | POA: Insufficient documentation

## 2023-12-14 DIAGNOSIS — C773 Secondary and unspecified malignant neoplasm of axilla and upper limb lymph nodes: Secondary | ICD-10-CM | POA: Diagnosis not present

## 2023-12-14 DIAGNOSIS — C549 Malignant neoplasm of corpus uteri, unspecified: Secondary | ICD-10-CM

## 2023-12-14 DIAGNOSIS — Z5112 Encounter for antineoplastic immunotherapy: Secondary | ICD-10-CM | POA: Insufficient documentation

## 2023-12-14 DIAGNOSIS — Z79620 Long term (current) use of immunosuppressive biologic: Secondary | ICD-10-CM | POA: Diagnosis not present

## 2023-12-14 DIAGNOSIS — Z7189 Other specified counseling: Secondary | ICD-10-CM

## 2023-12-14 DIAGNOSIS — D649 Anemia, unspecified: Secondary | ICD-10-CM | POA: Diagnosis not present

## 2023-12-14 DIAGNOSIS — Z923 Personal history of irradiation: Secondary | ICD-10-CM | POA: Insufficient documentation

## 2023-12-14 DIAGNOSIS — D638 Anemia in other chronic diseases classified elsewhere: Secondary | ICD-10-CM | POA: Diagnosis not present

## 2023-12-14 DIAGNOSIS — I10 Essential (primary) hypertension: Secondary | ICD-10-CM | POA: Diagnosis not present

## 2023-12-14 LAB — CBC WITH DIFFERENTIAL (CANCER CENTER ONLY)
Abs Immature Granulocytes: 0.04 K/uL (ref 0.00–0.07)
Basophils Absolute: 0 K/uL (ref 0.0–0.1)
Basophils Relative: 0 %
Eosinophils Absolute: 0.1 K/uL (ref 0.0–0.5)
Eosinophils Relative: 2 %
HCT: 34.4 % — ABNORMAL LOW (ref 36.0–46.0)
Hemoglobin: 11.3 g/dL — ABNORMAL LOW (ref 12.0–15.0)
Immature Granulocytes: 1 %
Lymphocytes Relative: 31 %
Lymphs Abs: 1.6 K/uL (ref 0.7–4.0)
MCH: 27.6 pg (ref 26.0–34.0)
MCHC: 32.8 g/dL (ref 30.0–36.0)
MCV: 84.1 fL (ref 80.0–100.0)
Monocytes Absolute: 0.3 K/uL (ref 0.1–1.0)
Monocytes Relative: 6 %
Neutro Abs: 3.1 K/uL (ref 1.7–7.7)
Neutrophils Relative %: 60 %
Platelet Count: 188 K/uL (ref 150–400)
RBC: 4.09 MIL/uL (ref 3.87–5.11)
RDW: 12 % (ref 11.5–15.5)
WBC Count: 5.1 K/uL (ref 4.0–10.5)
nRBC: 0 % (ref 0.0–0.2)

## 2023-12-14 LAB — CMP (CANCER CENTER ONLY)
ALT: 25 U/L (ref 0–44)
AST: 21 U/L (ref 15–41)
Albumin: 3.6 g/dL (ref 3.5–5.0)
Alkaline Phosphatase: 111 U/L (ref 38–126)
Anion gap: 6 (ref 5–15)
BUN: 33 mg/dL — ABNORMAL HIGH (ref 8–23)
CO2: 23 mmol/L (ref 22–32)
Calcium: 9.5 mg/dL (ref 8.9–10.3)
Chloride: 112 mmol/L — ABNORMAL HIGH (ref 98–111)
Creatinine: 1.4 mg/dL — ABNORMAL HIGH (ref 0.44–1.00)
GFR, Estimated: 40 mL/min — ABNORMAL LOW (ref >60)
Glucose, Bld: 165 mg/dL — ABNORMAL HIGH (ref 70–99)
Potassium: 4.3 mmol/L (ref 3.5–5.1)
Sodium: 141 mmol/L (ref 135–145)
Total Bilirubin: 0.5 mg/dL (ref 0.0–1.2)
Total Protein: 6.4 g/dL — ABNORMAL LOW (ref 6.5–8.1)

## 2023-12-14 LAB — TSH: TSH: <0.1 u[IU]/mL — ABNORMAL LOW (ref 0.350–4.500)

## 2023-12-14 LAB — TOTAL PROTEIN, URINE DIPSTICK: Protein, ur: 30 mg/dL — AB

## 2023-12-14 MED ORDER — LEVOTHYROXINE SODIUM 150 MCG PO TABS
150.0000 ug | ORAL_TABLET | Freq: Every day | ORAL | 0 refills | Status: DC
Start: 2023-12-14 — End: 2024-01-26

## 2023-12-14 MED ORDER — LENVATINIB (10 MG DAILY DOSE) 10 MG PO CPPK: 10.0000 mg | ORAL_CAPSULE | Freq: Every day | ORAL | 11 refills | Status: DC

## 2023-12-14 MED ORDER — SODIUM CHLORIDE 0.9 % IV SOLN: Freq: Once | INTRAVENOUS | Status: AC

## 2023-12-14 MED ORDER — SODIUM CHLORIDE 0.9 % IV SOLN
400.0000 mg | Freq: Once | INTRAVENOUS | Status: AC
  Administered 2023-12-14: 400 mg via INTRAVENOUS
  Filled 2023-12-14: qty 16

## 2023-12-14 NOTE — Assessment & Plan Note (Addendum)
This is likely multifactorial related to anemia chronic disease It is not likely due to her treatment She is not symptomatic.  Observe only.   

## 2023-12-14 NOTE — Assessment & Plan Note (Addendum)
 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%), MSI stable, ER 80%, PR 60%, Her2/neu neg, PD-L1 CPS 15%  She has disease relapse after completion of chemotherapy and was switched to pembrolizumab  and lenvatinib  with complete response, discontinued in 2023 She had biopsy proven disease relapse in 2024 and treatment was restarted again in October 2024  Overall, she tolerated treatment very well without major side effects except for poorly controlled hypertension and abnormal thyroid  function Imaging study from January 2025 and May 2025 showed positive response to therapy Lenvatinib  was placed on hold and since then, her blood pressure has dropped back to normal She resume Lenvima  recently after cataract surgery and tolerated that well We will proceed with treatment today and I plan to order CT imaging before her next appointment

## 2023-12-14 NOTE — Telephone Encounter (Signed)
 S/w patient about results of TSH. Patient aware that TSH levels are suppressed. Dr. Lonn would like to change synthroid  dose to 150 mcg and new prescription will be sent in to pharmacy. Patient verbalized an understanding of the results and confirmed pharmacy for new prescription to be sent to.

## 2023-12-14 NOTE — Progress Notes (Signed)
 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%), MSI stable, ER 80%, PR 60%, Her2/neu neg, PD-L1 CPS 15%  She has disease relapse after completion of chemotherapy and was switched to pembrolizumab  and lenvatinib  with complete response, discontinued in 2023 She had biopsy proven disease relapse in 2024 and treatment was restarted again in October 2024  Overall, she tolerated treatment very well without major side effects except for poorly controlled hypertension and abnormal thyroid  function Imaging study from January 2025 and May 2025 showed positive response to therapy Lenvatinib  was placed on hold and since then, her blood pressure has dropped back to normal She resume Lenvima  recently after cataract surgery and tolerated that well We will proceed with treatment today and I plan to order CT imaging before her next appointment Anemia, chronic disease This is likely multifactorial related to anemia chronic disease It is not likely due to her treatment She is not symptomatic.  Observe only.    Orders Placed This Encounter  Procedures   Ovarian CT ABD/PELVIS no oral contrast    Standing Status:   Future    Expected Date:   01/22/2024    Expiration Date:   12/13/2024    If indicated for the ordered procedure, I authorize the administration of contrast media per Radiology protocol:   Yes    Does the patient have a contrast media/X-ray dye allergy?:   No    Preferred imaging location?:   [facility]    If indicated for the ordered procedure, I authorize the administration of oral contrast media per Radiology  protocol:   No    Reason for no oral contrast::   No need oral contrast   Total Protein, Urine dipstick    Standing Status:   Future    Expected Date:   03/08/2024    Expiration Date:   03/08/2025   CBC with Differential (Cancer Center Only)    Standing Status:   Future    Expected Date:   03/08/2024    Expiration Date:   03/08/2025   CMP (Cancer Center only)    Standing Status:   Future    Expected Date:   03/08/2024    Expiration Date:   03/08/2025   T4    Standing Status:   Future    Expected Date:   03/08/2024    Expiration Date:   03/08/2025   TSH    Standing Status:   Future    Expected Date:   03/08/2024    Expiration Date:   03/08/2025     Krista Bedford, MD  INTERVAL HISTORY: she returns for treatment follow-up Complications related to previous cycle of chemotherapy included anemia, and recent hospitalization/ ER visit, She was recently hospitalized due to hypoglycemia Her blood sugar is still up and down and she is following up with her primary care doctor to manage her blood sugar  PHYSICAL EXAMINATION: ECOG PERFORMANCE STATUS: 1 - Symptomatic but completely ambulatory  No results found for: CAN125    Latest Ref Rng & Units 12/14/2023   10:10 AM 11/07/2023    2:16 PM 11/03/2023    8:28 AM  CBC  WBC 4.0 - 10.5  K/uL 5.1  6.6  5.3   Hemoglobin 12.0 - 15.0 g/dL 88.6  88.7  89.6   Hematocrit 36.0 - 46.0 % 34.4  39.0  32.2   Platelets 150 - 400 K/uL 188  168  147       Chemistry      Component Value Date/Time   NA 141 12/14/2023 1010   K 4.3 12/14/2023 1010   CL 112 (H) 12/14/2023 1010   CO2 23 12/14/2023 1010   BUN 33 (H) 12/14/2023 1010   CREATININE 1.40 (H) 12/14/2023 1010      Component Value Date/Time   CALCIUM  9.5 12/14/2023 1010   ALKPHOS 111 12/14/2023 1010   AST 21 12/14/2023 1010   ALT 25 12/14/2023 1010   BILITOT 0.5 12/14/2023 1010       Vitals:   12/14/23 1054  BP: (!) 159/95  Pulse: 86  Resp: 18  SpO2: 100%   Filed Weights    12/14/23 1054  Weight: 180 lb 3.2 oz (81.7 kg)   Other relevant data reviewed during this visit included CBC, CMP, urine protein

## 2023-12-15 ENCOUNTER — Ambulatory Visit

## 2023-12-15 ENCOUNTER — Other Ambulatory Visit

## 2023-12-15 ENCOUNTER — Ambulatory Visit: Admitting: Hematology and Oncology

## 2023-12-15 LAB — T4: T4, Total: 13.7 ug/dL — ABNORMAL HIGH (ref 4.5–12.0)

## 2024-01-22 ENCOUNTER — Ambulatory Visit (HOSPITAL_COMMUNITY)
Admission: RE | Admit: 2024-01-22 | Discharge: 2024-01-22 | Disposition: A | Discharge status: Home / Self Care | Source: Ambulatory Visit | Attending: Hematology and Oncology | Admitting: Hematology and Oncology

## 2024-01-22 DIAGNOSIS — C549 Malignant neoplasm of corpus uteri, unspecified: Secondary | ICD-10-CM | POA: Diagnosis not present

## 2024-01-22 DIAGNOSIS — R590 Localized enlarged lymph nodes: Secondary | ICD-10-CM | POA: Diagnosis not present

## 2024-01-22 DIAGNOSIS — C55 Malignant neoplasm of uterus, part unspecified: Secondary | ICD-10-CM | POA: Diagnosis not present

## 2024-01-22 DIAGNOSIS — C541 Malignant neoplasm of endometrium: Secondary | ICD-10-CM | POA: Insufficient documentation

## 2024-01-22 LAB — POCT I-STAT CREATININE: Creatinine, Ser: 1.5 mg/dL — ABNORMAL HIGH (ref 0.44–1.00)

## 2024-01-22 MED ORDER — HEPARIN SOD (PORK) LOCK FLUSH 100 UNIT/ML IV SOLN
500.0000 [IU] | Freq: Once | INTRAVENOUS | Status: AC
  Administered 2024-01-22: 500 [IU] via INTRAVENOUS

## 2024-01-22 MED ORDER — IOHEXOL 300 MG/ML  SOLN
100.0000 mL | Freq: Once | INTRAMUSCULAR | Status: AC | PRN
  Administered 2024-01-22: 80 mL via INTRAVENOUS

## 2024-01-22 MED ORDER — HEPARIN SOD (PORK) LOCK FLUSH 100 UNIT/ML IV SOLN
INTRAVENOUS | Status: AC
  Filled 2024-01-22: qty 5

## 2024-01-22 MED ORDER — SODIUM CHLORIDE (PF) 0.9 % IJ SOLN
INTRAMUSCULAR | Status: AC
  Filled 2024-01-22: qty 50

## 2024-01-24 ENCOUNTER — Encounter: Payer: Self-pay | Admitting: Emergency Medicine

## 2024-01-24 ENCOUNTER — Ambulatory Visit: Admission: EM | Admit: 2024-01-24 | Discharge: 2024-01-24 | Disposition: A | Discharge status: Home / Self Care

## 2024-01-24 ENCOUNTER — Ambulatory Visit (INDEPENDENT_AMBULATORY_CARE_PROVIDER_SITE_OTHER)

## 2024-01-24 DIAGNOSIS — K8020 Calculus of gallbladder without cholecystitis without obstruction: Secondary | ICD-10-CM | POA: Insufficient documentation

## 2024-01-24 DIAGNOSIS — J9801 Acute bronchospasm: Secondary | ICD-10-CM | POA: Diagnosis not present

## 2024-01-24 DIAGNOSIS — J329 Chronic sinusitis, unspecified: Secondary | ICD-10-CM

## 2024-01-24 DIAGNOSIS — C799 Secondary malignant neoplasm of unspecified site: Secondary | ICD-10-CM | POA: Insufficient documentation

## 2024-01-24 DIAGNOSIS — C549 Malignant neoplasm of corpus uteri, unspecified: Secondary | ICD-10-CM | POA: Insufficient documentation

## 2024-01-24 DIAGNOSIS — E1169 Type 2 diabetes mellitus with other specified complication: Secondary | ICD-10-CM | POA: Insufficient documentation

## 2024-01-24 DIAGNOSIS — J209 Acute bronchitis, unspecified: Secondary | ICD-10-CM

## 2024-01-24 DIAGNOSIS — I2510 Atherosclerotic heart disease of native coronary artery without angina pectoris: Secondary | ICD-10-CM | POA: Insufficient documentation

## 2024-01-24 DIAGNOSIS — G620 Drug-induced polyneuropathy: Secondary | ICD-10-CM | POA: Insufficient documentation

## 2024-01-24 DIAGNOSIS — R059 Cough, unspecified: Secondary | ICD-10-CM | POA: Diagnosis not present

## 2024-01-24 DIAGNOSIS — E1165 Type 2 diabetes mellitus with hyperglycemia: Secondary | ICD-10-CM | POA: Insufficient documentation

## 2024-01-24 DIAGNOSIS — E1121 Type 2 diabetes mellitus with diabetic nephropathy: Secondary | ICD-10-CM | POA: Insufficient documentation

## 2024-01-24 MED ORDER — AMOXICILLIN-POT CLAVULANATE 875-125 MG PO TABS: 1.0000 | ORAL_TABLET | Freq: Two times a day (BID) | ORAL | 0 refills | Status: AC

## 2024-01-24 MED ORDER — METHYLPREDNISOLONE SODIUM SUCC 125 MG IJ SOLR
125.0000 mg | Freq: Once | INTRAMUSCULAR | Status: AC
  Administered 2024-01-24: 125 mg via INTRAMUSCULAR

## 2024-01-24 MED ORDER — ALBUTEROL SULFATE HFA 108 (90 BASE) MCG/ACT IN AERS
2.0000 | INHALATION_SPRAY | Freq: Once | RESPIRATORY_TRACT | Status: AC
Start: 2024-01-24 — End: 2024-01-24
  Administered 2024-01-24: 2 via RESPIRATORY_TRACT

## 2024-01-24 MED ORDER — IPRATROPIUM-ALBUTEROL 0.5-2.5 (3) MG/3ML IN SOLN
3.0000 mL | Freq: Once | RESPIRATORY_TRACT | Status: AC
  Administered 2024-01-24: 3 mL via RESPIRATORY_TRACT

## 2024-01-24 MED ORDER — MUCINEX DM MAXIMUM STRENGTH 60-1200 MG PO TB12: 1.0000 | ORAL_TABLET | Freq: Two times a day (BID) | ORAL | 0 refills | Status: DC

## 2024-01-24 MED ORDER — BENZONATATE 200 MG PO CAPS: 200.0000 mg | ORAL_CAPSULE | Freq: Three times a day (TID) | ORAL | 0 refills | Status: DC | PRN

## 2024-01-24 MED ORDER — BUDESONIDE-FORMOTEROL FUMARATE 80-4.5 MCG/ACT IN AERO: 2.0000 | INHALATION_SPRAY | Freq: Two times a day (BID) | RESPIRATORY_TRACT | 0 refills | Status: AC

## 2024-01-24 MED ORDER — ALBUTEROL SULFATE HFA 108 (90 BASE) MCG/ACT IN AERS: 2.0000 | INHALATION_SPRAY | RESPIRATORY_TRACT | 0 refills | Status: DC | PRN

## 2024-01-24 MED ORDER — AEROCHAMBER PLUS FLO-VU MEDIUM MISC
1.0000 | Freq: Once | Status: AC
  Administered 2024-01-24: 1

## 2024-01-24 MED ORDER — PREDNISONE 20 MG PO TABS: 40.0000 mg | ORAL_TABLET | Freq: Every day | ORAL | 0 refills | Status: AC

## 2024-01-24 NOTE — ED Triage Notes (Signed)
 Pt presents c/o cough, scratchy throat, runny nose x 4 weeks. Pt reports she has not taken herallergy meds in weeks. Pt denies any additional sxs.

## 2024-01-24 NOTE — Discharge Instructions (Addendum)
 You were seen today for cough and chest congestion that have been ongoing for the past 4 weeks but worsening over the past couple of days. During your visit, you had an episode of difficulty catching your breath which caused your oxygen to drop. You were treated right away with oxygen, a breathing treatment (nebulizer), and a steroid shot, which helped improve your symptoms. Your chest X-ray did not show any pneumonia or other serious lung problems. Your symptoms are most likely due to bronchitis with bronchospasms, which means the tubes in your lungs are irritated and temporarily narrowed, making it harder to breathe. You were prescribed an antibiotic to treat possible infection, a steroid to reduce inflammation, cough medicine to help with your cough, Symbicort inhaler to use every day to help with the bronchospasms, and an albuterol  inhaler to use as needed for sudden breathing problems. Please take all medicines exactly as directed. Make sure to rinse your mouth after using the inhalers to prevent developing an oral yeast infection.   Your pharmacy was already closed for the day, so you were provided with an albuterol  inhaler and a spacer to use tonight as needed. You may use this inhaler every 4 hours if you experience mild shortness of breath or bronchospasms. If you have a more moderate episode, you may use 2 puffs every 20 minutes for up to 3 doses.  Please pick up your prescriptions first thing in the morning. Let the pharmacy staff know that you do not need the albuterol  inhaler from your prescription, as you were given one here this evening.  Drink plenty of fluids, get good rest, and use a humidifier or steam to help loosen mucus. Follow up with your primary care doctor for recheck and continued management. Go to the emergency department right away if you have worsening shortness of breath, chest pain, fainting, confusion, lips or fingertips turning blue, or if you feel like you cannot catch your  breath even after using your inhaler.
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
## 2024-01-26 ENCOUNTER — Ambulatory Visit

## 2024-01-26 ENCOUNTER — Inpatient Hospital Stay (HOSPITAL_BASED_OUTPATIENT_CLINIC_OR_DEPARTMENT_OTHER): Admitting: Hematology and Oncology

## 2024-01-26 ENCOUNTER — Other Ambulatory Visit: Payer: Self-pay

## 2024-01-26 ENCOUNTER — Encounter: Payer: Self-pay | Admitting: Hematology and Oncology

## 2024-01-26 ENCOUNTER — Ambulatory Visit: Payer: Self-pay | Admitting: Hematology and Oncology

## 2024-01-26 ENCOUNTER — Inpatient Hospital Stay: Attending: Gynecology

## 2024-01-26 VITALS — BP 173/104 | HR 94 | Temp 97.4°F | Resp 18 | Ht 66.0 in | Wt 179.4 lb

## 2024-01-26 DIAGNOSIS — C541 Malignant neoplasm of endometrium: Secondary | ICD-10-CM | POA: Insufficient documentation

## 2024-01-26 DIAGNOSIS — Z7189 Other specified counseling: Secondary | ICD-10-CM

## 2024-01-26 DIAGNOSIS — Z5112 Encounter for antineoplastic immunotherapy: Secondary | ICD-10-CM | POA: Insufficient documentation

## 2024-01-26 DIAGNOSIS — C773 Secondary and unspecified malignant neoplasm of axilla and upper limb lymph nodes: Secondary | ICD-10-CM | POA: Diagnosis not present

## 2024-01-26 DIAGNOSIS — I129 Hypertensive chronic kidney disease with stage 1 through stage 4 chronic kidney disease, or unspecified chronic kidney disease: Secondary | ICD-10-CM | POA: Diagnosis not present

## 2024-01-26 DIAGNOSIS — I952 Hypotension due to drugs: Secondary | ICD-10-CM | POA: Diagnosis not present

## 2024-01-26 DIAGNOSIS — N1831 Chronic kidney disease, stage 3a: Secondary | ICD-10-CM | POA: Diagnosis not present

## 2024-01-26 DIAGNOSIS — Z79620 Long term (current) use of immunosuppressive biologic: Secondary | ICD-10-CM | POA: Diagnosis not present

## 2024-01-26 DIAGNOSIS — E1122 Type 2 diabetes mellitus with diabetic chronic kidney disease: Secondary | ICD-10-CM | POA: Diagnosis not present

## 2024-01-26 LAB — CBC WITH DIFFERENTIAL (CANCER CENTER ONLY)
Abs Immature Granulocytes: 0.1 K/uL — ABNORMAL HIGH (ref 0.00–0.07)
Basophils Absolute: 0 K/uL (ref 0.0–0.1)
Basophils Relative: 0 %
Eosinophils Absolute: 0 K/uL (ref 0.0–0.5)
Eosinophils Relative: 0 %
HCT: 38.1 % (ref 36.0–46.0)
Hemoglobin: 12.4 g/dL (ref 12.0–15.0)
Immature Granulocytes: 1 %
Lymphocytes Relative: 7 %
Lymphs Abs: 0.9 K/uL (ref 0.7–4.0)
MCH: 27.3 pg (ref 26.0–34.0)
MCHC: 32.5 g/dL (ref 30.0–36.0)
MCV: 83.9 fL (ref 80.0–100.0)
Monocytes Absolute: 0.3 K/uL (ref 0.1–1.0)
Monocytes Relative: 3 %
Neutro Abs: 10.1 K/uL — ABNORMAL HIGH (ref 1.7–7.7)
Neutrophils Relative %: 89 %
Platelet Count: 203 K/uL (ref 150–400)
RBC: 4.54 MIL/uL (ref 3.87–5.11)
RDW: 14.2 % (ref 11.5–15.5)
WBC Count: 11.4 K/uL — ABNORMAL HIGH (ref 4.0–10.5)
nRBC: 0 % (ref 0.0–0.2)

## 2024-01-26 LAB — CMP (CANCER CENTER ONLY)
ALT: 16 U/L (ref 0–44)
AST: 18 U/L (ref 15–41)
Albumin: 4.1 g/dL (ref 3.5–5.0)
Alkaline Phosphatase: 82 U/L (ref 38–126)
Anion gap: 7 (ref 5–15)
BUN: 32 mg/dL — ABNORMAL HIGH (ref 8–23)
CO2: 23 mmol/L (ref 22–32)
Calcium: 10 mg/dL (ref 8.9–10.3)
Chloride: 108 mmol/L (ref 98–111)
Creatinine: 1.58 mg/dL — ABNORMAL HIGH (ref 0.44–1.00)
GFR, Estimated: 34 mL/min — ABNORMAL LOW (ref >60)
Glucose, Bld: 215 mg/dL — ABNORMAL HIGH (ref 70–99)
Potassium: 4.2 mmol/L (ref 3.5–5.1)
Sodium: 138 mmol/L (ref 135–145)
Total Bilirubin: 0.6 mg/dL (ref 0.0–1.2)
Total Protein: 7.1 g/dL (ref 6.5–8.1)

## 2024-01-26 LAB — TOTAL PROTEIN, URINE DIPSTICK: Protein, ur: 300 mg/dL — AB

## 2024-01-26 LAB — TSH: TSH: <0.1 u[IU]/mL — ABNORMAL LOW (ref 0.350–4.500)

## 2024-01-26 MED ORDER — LEVOTHYROXINE SODIUM 100 MCG PO TABS: 100.0000 ug | ORAL_TABLET | Freq: Every day | ORAL | 0 refills | Status: DC

## 2024-01-26 NOTE — Progress Notes (Signed)
 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%), MSI stable, ER 80%, PR 60%, Her2/neu neg, PD-L1 CPS 15%  She has disease relapse after completion of chemotherapy and was switched to pembrolizumab  and lenvatinib  with complete response, discontinued in 2023 She had biopsy proven disease relapse in 2024 and treatment with pembrolizumab  and lenvatinib  was restarted again in October 2024  Overall, she tolerated treatment very well without major side effects except for poorly controlled hypertension and abnormal thyroid  function I reviewed imaging study from October 2025 which show positive response to treatment The plan will be to continue on current treatment of pembrolizumab  and lenvatinib  indefinitely However, the patient is placed on a course of antibiotics along with high-dose steroids due to bronchitis  I recommend holding treatment today Her blood pressure is also very high I will defer treatment by 10 days and I will see her again on October 20 to resume treatment if everything is back to normal Hypotension due to drugs She has uncontrolled hypertension and proteinuria likely due to steroid treatment She will hold Lenvima  until I see her back on October 20  Orders Placed This Encounter  Procedures   Total Protein, Urine dipstick    Standing Status:   Future    Expected Date:   02/05/2024    Expiration Date:   02/04/2025   CBC with Differential (Cancer Center Only)    Standing Status:   Future    Expected Date:   02/05/2024    Expiration Date:   02/04/2025   CMP (Cancer Center  only)    Standing Status:   Future    Expected Date:   02/05/2024    Expiration Date:   02/04/2025   Total Protein, Urine dipstick    Standing Status:   Future    Expected Date:   04/29/2024    Expiration Date:   04/29/2025   CBC with Differential (Cancer Center Only)    Standing Status:   Future    Expected Date:   04/29/2024    Expiration Date:   04/29/2025   CMP (Cancer Center only)    Standing Status:   Future    Expected Date:   04/29/2024    Expiration Date:   04/29/2025   T4    Standing Status:   Future    Expected Date:   04/29/2024    Expiration Date:   04/29/2025   TSH    Standing Status:   Future    Expected Date:   04/29/2024    Expiration Date:   04/29/2025     Almarie Bedford, MD  INTERVAL HISTORY: she returns for treatment follow-up Complications related to previous cycle of chemotherapy included recent hospitalization/ ER visit,, infection,, and elevated BP She went to the emergency room several days ago for symptoms of bronchitis She received nebulizer, high-dose steroids, oral antibiotics and oral prednisone She is still on treatment Overall, she is improving She noted however that her blood sugar and blood pressure is very high We reviewed CT imaging studies and discussed rationale behind deferring treatment  PHYSICAL EXAMINATION: ECOG PERFORMANCE STATUS: 1 - Symptomatic but completely ambulatory  No results found for:  [id number]    Latest Ref Rng & Units 01/26/2024    8:38 AM 12/14/2023   10:10 AM 11/07/2023    2:16 PM  CBC  WBC 4.0 - 10.5 K/uL 11.4  5.1  6.6   Hemoglobin 12.0 - 15.0 g/dL 87.5  88.6  88.7   Hematocrit 36.0 - 46.0 % 38.1  34.4  39.0   Platelets 150 - 400 K/uL 203  188  168       Chemistry      Component Value Date/Time   NA 141 12/14/2023 1010   K 4.3 12/14/2023 1010   CL 112 (H) 12/14/2023 1010   CO2 23 12/14/2023 1010   BUN 33 (H) 12/14/2023 1010   CREATININE 1.50 (H) 01/22/2024 0952   CREATININE 1.40 (H) 12/14/2023 1010       Component Value Date/Time   CALCIUM  9.5 12/14/2023 1010   ALKPHOS 111 12/14/2023 1010   AST 21 12/14/2023 1010   ALT 25 12/14/2023 1010   BILITOT 0.5 12/14/2023 1010       Vitals:   01/26/24 0918  BP: (!) 173/104  Pulse: 94  Resp: 18  Temp: (!) 97.4 F (36.3 C)  SpO2: 98%   Filed Weights   01/26/24 0918  Weight: 179 lb 6.4 oz (81.4 kg)   Other relevant data reviewed during this visit included CBC, CMP, chest x-ray and recent ER visit note

## 2024-01-26 NOTE — Assessment & Plan Note (Addendum)
 She has uncontrolled hypertension and proteinuria likely due to steroid treatment She will hold Lenvima  until I see her back on October 20

## 2024-01-26 NOTE — Telephone Encounter (Signed)
 Called and left below message. Rx sent to her preferred pharmacy. Ask her to call the office for questions.

## 2024-01-26 NOTE — Assessment & Plan Note (Addendum)
 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%), MSI stable, ER 80%, PR 60%, Her2/neu neg, PD-L1 CPS 15%  She has disease relapse after completion of chemotherapy and was switched to pembrolizumab  and lenvatinib  with complete response, discontinued in 2023 She had biopsy proven disease relapse in 2024 and treatment with pembrolizumab  and lenvatinib  was restarted again in October 2024  Overall, she tolerated treatment very well without major side effects except for poorly controlled hypertension and abnormal thyroid  function I reviewed imaging study from October 2025 which show positive response to treatment The plan will be to continue on current treatment of pembrolizumab  and lenvatinib  indefinitely However, the patient is placed on a course of antibiotics along with high-dose steroids due to bronchitis  I recommend holding treatment today Her blood pressure is also very high I will defer treatment by 10 days and I will see her again on October 20 to resume treatment if everything is back to normal

## 2024-01-27 LAB — T4: T4, Total: 9.5 ug/dL (ref 4.5–12.0)

## 2024-02-05 ENCOUNTER — Inpatient Hospital Stay

## 2024-02-05 ENCOUNTER — Encounter: Payer: Self-pay | Admitting: Hematology and Oncology

## 2024-02-05 ENCOUNTER — Inpatient Hospital Stay: Admitting: Hematology and Oncology

## 2024-02-05 VITALS — BP 143/79 | HR 71 | Temp 97.6°F | Resp 16 | Wt 184.8 lb

## 2024-02-05 DIAGNOSIS — Z7189 Other specified counseling: Secondary | ICD-10-CM

## 2024-02-05 DIAGNOSIS — C541 Malignant neoplasm of endometrium: Secondary | ICD-10-CM | POA: Diagnosis not present

## 2024-02-05 DIAGNOSIS — Z5112 Encounter for antineoplastic immunotherapy: Secondary | ICD-10-CM | POA: Diagnosis not present

## 2024-02-05 DIAGNOSIS — N1831 Chronic kidney disease, stage 3a: Secondary | ICD-10-CM | POA: Diagnosis not present

## 2024-02-05 DIAGNOSIS — E1169 Type 2 diabetes mellitus with other specified complication: Secondary | ICD-10-CM | POA: Diagnosis not present

## 2024-02-05 DIAGNOSIS — C773 Secondary and unspecified malignant neoplasm of axilla and upper limb lymph nodes: Secondary | ICD-10-CM | POA: Diagnosis not present

## 2024-02-05 DIAGNOSIS — I129 Hypertensive chronic kidney disease with stage 1 through stage 4 chronic kidney disease, or unspecified chronic kidney disease: Secondary | ICD-10-CM | POA: Diagnosis not present

## 2024-02-05 DIAGNOSIS — I952 Hypotension due to drugs: Secondary | ICD-10-CM | POA: Diagnosis not present

## 2024-02-05 LAB — CMP (CANCER CENTER ONLY)
ALT: 13 U/L (ref 0–44)
AST: 14 U/L — ABNORMAL LOW (ref 15–41)
Albumin: 3.5 g/dL (ref 3.5–5.0)
Alkaline Phosphatase: 60 U/L (ref 38–126)
Anion gap: 4 — ABNORMAL LOW (ref 5–15)
BUN: 27 mg/dL — ABNORMAL HIGH (ref 8–23)
CO2: 24 mmol/L (ref 22–32)
Calcium: 9.6 mg/dL (ref 8.9–10.3)
Chloride: 112 mmol/L — ABNORMAL HIGH (ref 98–111)
Creatinine: 1.45 mg/dL — ABNORMAL HIGH (ref 0.44–1.00)
GFR, Estimated: 38 mL/min — ABNORMAL LOW (ref >60)
Glucose, Bld: 190 mg/dL — ABNORMAL HIGH (ref 70–99)
Potassium: 4 mmol/L (ref 3.5–5.1)
Sodium: 140 mmol/L (ref 135–145)
Total Bilirubin: 0.5 mg/dL (ref 0.0–1.2)
Total Protein: 5.8 g/dL — ABNORMAL LOW (ref 6.5–8.1)

## 2024-02-05 LAB — CBC WITH DIFFERENTIAL (CANCER CENTER ONLY)
Abs Immature Granulocytes: 0.01 K/uL (ref 0.00–0.07)
Basophils Absolute: 0 K/uL (ref 0.0–0.1)
Basophils Relative: 0 %
Eosinophils Absolute: 0.1 K/uL (ref 0.0–0.5)
Eosinophils Relative: 2 %
HCT: 36 % (ref 36.0–46.0)
Hemoglobin: 11.4 g/dL — ABNORMAL LOW (ref 12.0–15.0)
Immature Granulocytes: 0 %
Lymphocytes Relative: 29 %
Lymphs Abs: 1.1 K/uL (ref 0.7–4.0)
MCH: 27.1 pg (ref 26.0–34.0)
MCHC: 31.7 g/dL (ref 30.0–36.0)
MCV: 85.7 fL (ref 80.0–100.0)
Monocytes Absolute: 0.3 K/uL (ref 0.1–1.0)
Monocytes Relative: 7 %
Neutro Abs: 2.4 K/uL (ref 1.7–7.7)
Neutrophils Relative %: 62 %
Platelet Count: 149 K/uL — ABNORMAL LOW (ref 150–400)
RBC: 4.2 MIL/uL (ref 3.87–5.11)
RDW: 15.1 % (ref 11.5–15.5)
WBC Count: 3.8 K/uL — ABNORMAL LOW (ref 4.0–10.5)
nRBC: 0 % (ref 0.0–0.2)

## 2024-02-05 LAB — TOTAL PROTEIN, URINE DIPSTICK: Protein, ur: 30 mg/dL — AB

## 2024-02-05 MED ORDER — SODIUM CHLORIDE 0.9 % IV SOLN: Freq: Once | INTRAVENOUS | Status: AC

## 2024-02-05 MED ORDER — SODIUM CHLORIDE 0.9 % IV SOLN
400.0000 mg | Freq: Once | INTRAVENOUS | Status: AC
  Administered 2024-02-05: 400 mg via INTRAVENOUS
  Filled 2024-02-05: qty 16

## 2024-02-05 NOTE — Patient Instructions (Signed)
 [facility] - A DEPT OF [facility]  Discharge Instructions: Thank you for choosing [facility] to provide your oncology and hematology care.   If you have a lab appointment with the Cancer Center, please go directly to the Cancer Center and check in at the registration area.   Wear comfortable clothing and clothing appropriate for easy access to any Portacath or PICC line.   We strive to give you quality time with your provider. You may need to reschedule your appointment if you arrive late (15 or more minutes).  Arriving late affects you and other patients whose appointments are after yours.  Also, if you miss three or more appointments without notifying the office, you may be dismissed from the clinic at the provider's discretion.      For prescription refill requests, have your pharmacy contact our office and allow 72 hours for refills to be completed.    Today you received the following chemotherapy and/or immunotherapy agents: pembrolizumab      To help prevent nausea and vomiting after your treatment, we encourage you to take your nausea medication as directed.  BELOW ARE SYMPTOMS THAT SHOULD BE REPORTED IMMEDIATELY: *FEVER GREATER THAN 100.4 F (38 C) OR HIGHER *CHILLS OR SWEATING *NAUSEA AND VOMITING THAT IS NOT CONTROLLED WITH YOUR NAUSEA MEDICATION *UNUSUAL SHORTNESS OF BREATH *UNUSUAL BRUISING OR BLEEDING *URINARY PROBLEMS (pain or burning when urinating, or frequent urination) *BOWEL PROBLEMS (unusual diarrhea, constipation, pain near the anus) TENDERNESS IN MOUTH AND THROAT WITH OR WITHOUT PRESENCE OF ULCERS (sore throat, sores in mouth, or a toothache) UNUSUAL RASH, SWELLING OR PAIN  UNUSUAL VAGINAL DISCHARGE OR ITCHING   Items with * indicate a potential emergency and should be followed up as soon as possible or go to the Emergency Department if any problems should occur.  Please show the CHEMOTHERAPY ALERT CARD or  IMMUNOTHERAPY ALERT CARD at check-in to the Emergency Department and triage nurse.  Should you have questions after your visit or need to cancel or reschedule your appointment, please contact [facility] - A DEPT OF [facility]  Dept: [phone]  and follow the prompts.  Office hours are 8:00 a.m. to 4:30 p.m. Monday - Friday. Please note that voicemails left after 4:00 p.m. may not be returned until the following business day.  We are closed weekends and major holidays. You have access to a nurse at all times for urgent questions. Please call the main number to the clinic Dept: [phone] and follow the prompts.   For any non-urgent questions, you may also contact your provider using MyChart. We now offer e-Visits for anyone 78 and older to request care online for non-urgent symptoms. For details visit [email or web address].   Also download the MyChart app! Go to the app store, search "MyChart", open the app, select [place], and log in with your MyChart username and password.

## 2024-02-05 NOTE — Assessment & Plan Note (Addendum)
 She has a high blood sugar We discussed importance of dietary modification while on treatment

## 2024-02-05 NOTE — Progress Notes (Signed)
 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%), MSI stable, ER 80%, PR 60%, Her2/neu neg, PD-L1 CPS 15%  She has disease relapse after completion of chemotherapy and was switched to pembrolizumab  and lenvatinib  with complete response, discontinued in 2023 She had biopsy proven disease relapse in 2024 and treatment with pembrolizumab  and lenvatinib  was restarted again in October 2024  Overall, she tolerated treatment very well without major side effects except for poorly controlled hypertension and abnormal thyroid  function Imaging study from October 2025 showed positive response to treatment The plan will be to continue on current treatment of pembrolizumab  and lenvatinib  indefinitely  Her Lenvima  treatment was temporarily placed on hold recently due to hypertension Blood pressure has much improved now She will resume Lenvima  Urine protein is stable We discussed importance of close monitoring of blood pressure while on treatment Stage 3a chronic kidney disease (HCC) She has stable chronic kidney disease stage III Advised the patient to increase oral fluid intake and monitor blood pressure carefully Type 2 diabetes mellitus with other specified complication, unspecified whether long term insulin  use (HCC) She has a high blood sugar We discussed importance of dietary modification while on treatment  Orders Placed This Encounter  Procedures   Total Protein, Urine dipstick    Standing Status:   Future    Expected Date:   06/10/2024    Expiration Date:   06/10/2025   CBC with Differential  (Cancer Center Only)    Standing Status:   Future    Expected Date:   06/10/2024    Expiration Date:   06/10/2025   CMP (Cancer Center only)    Standing Status:   Future    Expected Date:   06/10/2024    Expiration Date:   06/10/2025   T4    Standing Status:   Future    Expected Date:   06/10/2024    Expiration Date:   06/10/2025   TSH    Standing Status:   Future    Expected Date:   06/10/2024    Expiration Date:   06/10/2025     Almarie Bedford, MD  INTERVAL HISTORY: she returns for treatment follow-up Complications related to previous cycle of chemotherapy included pancytopenia,, elevated BP, and elevated serum creatinine  PHYSICAL EXAMINATION: ECOG PERFORMANCE STATUS: 1 - Symptomatic but completely ambulatory  No results found for: CAN125    Latest Ref Rng & Units 02/05/2024    8:12 AM 01/26/2024    8:38 AM 12/14/2023   10:10 AM  CBC  WBC 4.0 - 10.5 K/uL 3.8  11.4  5.1   Hemoglobin 12.0 - 15.0 g/dL 88.5  87.5  88.6   Hematocrit 36.0 - 46.0 % 36.0  38.1  34.4   Platelets 150 - 400 K/uL 149  203  188       Chemistry      Component Value Date/Time   NA 140 02/05/2024 0812   K 4.0 02/05/2024 0812   CL 112 (H) 02/05/2024 0812   CO2 24 02/05/2024 0812   BUN 27 (H) 02/05/2024 0812   CREATININE 1.45 (H) 02/05/2024 0812      Component Value Date/Time  CALCIUM  9.6 02/05/2024 0812   ALKPHOS 60 02/05/2024 0812   AST 14 (L) 02/05/2024 0812   ALT 13 02/05/2024 0812   BILITOT 0.5 02/05/2024 0812       There were no vitals filed for this visit. There were no vitals filed for this visit. Other relevant data reviewed during this visit included CBC and CMP

## 2024-02-05 NOTE — Assessment & Plan Note (Addendum)
 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%), MSI stable, ER 80%, PR 60%, Her2/neu neg, PD-L1 CPS 15%  She has disease relapse after completion of chemotherapy and was switched to pembrolizumab  and lenvatinib  with complete response, discontinued in 2023 She had biopsy proven disease relapse in 2024 and treatment with pembrolizumab  and lenvatinib  was restarted again in October 2024  Overall, she tolerated treatment very well without major side effects except for poorly controlled hypertension and abnormal thyroid  function Imaging study from October 2025 showed positive response to treatment The plan will be to continue on current treatment of pembrolizumab  and lenvatinib  indefinitely  Her Lenvima  treatment was temporarily placed on hold recently due to hypertension Blood pressure has much improved now She will resume Lenvima  Urine protein is stable We discussed importance of close monitoring of blood pressure while on treatment

## 2024-02-05 NOTE — Assessment & Plan Note (Addendum)
 She has stable chronic kidney disease stage III Advised the patient to increase oral fluid intake and monitor blood pressure carefully

## 2024-02-06 ENCOUNTER — Other Ambulatory Visit: Payer: Self-pay

## 2024-02-07 ENCOUNTER — Encounter: Payer: Self-pay | Admitting: Podiatry

## 2024-02-07 ENCOUNTER — Ambulatory Visit: Admitting: Podiatry

## 2024-02-07 DIAGNOSIS — M79675 Pain in left toe(s): Secondary | ICD-10-CM

## 2024-02-07 DIAGNOSIS — Z794 Long term (current) use of insulin: Secondary | ICD-10-CM | POA: Diagnosis not present

## 2024-02-07 DIAGNOSIS — N183 Chronic kidney disease, stage 3 unspecified: Secondary | ICD-10-CM | POA: Diagnosis not present

## 2024-02-07 DIAGNOSIS — E0822 Diabetes mellitus due to underlying condition with diabetic chronic kidney disease: Secondary | ICD-10-CM

## 2024-02-07 DIAGNOSIS — G620 Drug-induced polyneuropathy: Secondary | ICD-10-CM

## 2024-02-07 DIAGNOSIS — T451X5A Adverse effect of antineoplastic and immunosuppressive drugs, initial encounter: Secondary | ICD-10-CM

## 2024-02-07 DIAGNOSIS — M79674 Pain in right toe(s): Secondary | ICD-10-CM

## 2024-02-07 DIAGNOSIS — B351 Tinea unguium: Secondary | ICD-10-CM | POA: Diagnosis not present

## 2024-02-08 ENCOUNTER — Other Ambulatory Visit: Payer: Self-pay

## 2024-02-09 ENCOUNTER — Ambulatory Visit (INDEPENDENT_AMBULATORY_CARE_PROVIDER_SITE_OTHER): Admitting: Internal Medicine

## 2024-02-09 ENCOUNTER — Encounter: Payer: Self-pay | Admitting: Internal Medicine

## 2024-02-09 ENCOUNTER — Other Ambulatory Visit

## 2024-02-09 VITALS — BP 120/82 | HR 80 | Ht 66.0 in | Wt 182.0 lb

## 2024-02-09 DIAGNOSIS — E1142 Type 2 diabetes mellitus with diabetic polyneuropathy: Secondary | ICD-10-CM | POA: Diagnosis not present

## 2024-02-09 DIAGNOSIS — Z794 Long term (current) use of insulin: Secondary | ICD-10-CM | POA: Diagnosis not present

## 2024-02-09 DIAGNOSIS — N1831 Chronic kidney disease, stage 3a: Secondary | ICD-10-CM | POA: Diagnosis not present

## 2024-02-09 DIAGNOSIS — E1122 Type 2 diabetes mellitus with diabetic chronic kidney disease: Secondary | ICD-10-CM

## 2024-02-09 LAB — POCT GLYCOSYLATED HEMOGLOBIN (HGB A1C): Hemoglobin A1C: 7.1 % — AB (ref 4.0–5.6)

## 2024-02-09 LAB — POCT GLUCOSE (DEVICE FOR HOME USE): POC Glucose: 112 mg/dL — AB (ref 70–99)

## 2024-02-09 MED ORDER — GLIMEPIRIDE 1 MG PO TABS: 1.0000 mg | ORAL_TABLET | Freq: Every day | ORAL | 3 refills | Status: AC

## 2024-02-09 NOTE — Progress Notes (Addendum)
 Name: Krista Johnson  Age/ Sex: 73 y.o., female   MRN/ DOB: [id number], [birth year]     PCP: Verena Reena, MD   Reason for Endocrinology Evaluation: Type 2 Diabetes Mellitus  Initial Endocrine Consultative Visit: 12/26/2017    PATIENT IDENTIFIER: [name] is a 73 y.o. female with a past medical history of DM, HTN, hypothyroid, CKD, Hx endometrial cancer. The patient has followed with Endocrinology clinic since 12/26/2017 for consultative assistance with management of her diabetes.  DIABETIC HISTORY:  Krista Johnson was diagnosed with DM 2004, she was on insulin  between 2019-2022 while on chemotherapy. Her hemoglobin A1c has ranged from 5.8% in 2022, peaking at 14.9% in 2019. She was following up with Dr. Kassie, subsequently saw Dr. Von 09/2021  Jardiance  caused constipation   Patient developed severe hypoglycemia while on glipizide , as she continued to take glipizide  on regular basis despite having vomiting.  I discontinued glipizide  and started her on Jardiance  in July, 2025 with an A1c of 7.1%.  The patient was provided patient assistance papers at the time  But the patient had persistent hyperglycemia and constipation despite being on Jardiance , and opted to start glimepiride    SUBJECTIVE:   During the last visit (11/10/2023): A1c 7.1%    Today (02/09/2024): Krista Johnson is here for follow-up on diabetes management.  She checks her blood sugars 2-3 times daily . The patient has not  had hypoglycemic episodes since the last clinic visit.   Patient follows with oncology for endometrial cancer, she is on Keytruda  infusions Q 6 weeks  Pt has history of allergies and eczema, had an episode of bronchospasm which required glucocorticoid intake. Which has resulted in severe hyperglycemia.   NO SOB but has mild cough  No nausea  No constipation    HOME DIABETES REGIMEN:  Glimepiride  1 mg daily   Statin: Yes ACE-I/ARB: no  METER DOWNLOAD SUMMARY: n/a    DIABETIC  COMPLICATIONS: Microvascular complications:  CKD III Denies:  Last Eye Exam: Completed 11/17/2023  Macrovascular complications:   Denies: CAD, CVA, PVD   HISTORY:  Past Medical History:  Past Medical History:  Diagnosis Date   Arthritis    right knee--- last cortisone injection 04/ 2019   CKD (chronic kidney disease)    Diabetes mellitus without complication [facility])    endocrinologist-  dr kassie   Endometrial cancer [facility])    History of colon polyps    Hyperlipidemia    Hyperparathyroidism    Hypertension    Past Surgical History:  Past Surgical History:  Procedure Laterality Date   AXILLARY LYMPH NODE BIOPSY Right 04/22/2022   Procedure: RIGHT AXILLARY LYMPH NODE BIOPSY;  Surgeon: Lyndel Deward PARAS, MD;  Location: [facility] OR;  Service: General;  Laterality: Right;   COLONOSCOPY  last one 12-25-2017   IR IMAGING GUIDED PORT INSERTION  02/20/2018   ROBOTIC ASSISTED TOTAL HYSTERECTOMY WITH BILATERAL SALPINGO OOPHERECTOMY N/A 01/23/2018   Procedure: XI ROBOTIC ASSISTED TOTAL HYSTERECTOMY WITH BILATERAL SALPINGO OOPHORECTOMY;  Surgeon: Eloy Herring, MD;  Location: [facility] ORS;  Service: Gynecology;  Laterality: N/A;   SENTINEL NODE BIOPSY N/A 01/23/2018   Procedure: SENTINEL NODE BIOPSY;  Surgeon: Eloy Herring, MD;  Location: [facility] ORS;  Service: Gynecology;  Laterality: N/A;   Social History:  reports that she has never smoked. She has never been exposed to tobacco smoke. She has never used smokeless tobacco. She reports that she does not currently use alcohol. She reports that she does not use drugs. Family History:  Family  History  Problem Relation Age of Onset   Diabetes Mother    Hypertension Mother    Diabetes Sister    Hypertension Sister    Diabetes Maternal Uncle    Colon cancer Paternal Aunt    Diabetes Paternal Aunt    Stomach cancer Neg Hx    Rectal cancer Neg Hx    Esophageal cancer Neg Hx    Colon polyps Neg Hx      HOME MEDICATIONS: Allergies as of 02/09/2024        Reactions   Sulfa Antibiotics Nausea Only   Atenolol  Other (See Comments)   Dropped B/P too much        Medication List        Accurate as of February 09, 2024  8:50 AM. If you have any questions, ask your nurse or doctor.          albuterol  108 (90 Base) MCG/ACT inhaler Commonly known as: VENTOLIN  HFA Inhale 2 puffs into the lungs every 4 (four) hours as needed for wheezing or shortness of breath (bronchospasms).   amLODipine-valsartan 5-160 MG tablet Commonly known as: EXFORGE Take 1 tablet by mouth daily.   aspirin  EC 81 MG tablet Take 81 mg by mouth daily. Swallow whole.   Azelastine  HCl 0.15 % Soln Place 1-2 sprays into both nostrils daily as needed (sinus congestion/issues.).   benzonatate 200 MG capsule Commonly known as: TESSALON Take 1 capsule (200 mg total) by mouth 3 (three) times daily as needed for cough.   budesonide-formoterol 80-4.5 MCG/ACT inhaler Commonly known as: SYMBICORT Inhale 2 puffs into the lungs in the morning and at bedtime. Rinse mouth with water  after use   FreeStyle Libre 3 Reader Espiridion See admin instructions.   FreeStyle [employer] 3 Sensor Misc See admin instructions.   gatifloxacin 0.5 % Soln Commonly known as: ZYMAXID Place 1 drop into the right eye 4 (four) times daily.   glimepiride  1 MG tablet Commonly known as: AMARYL  Take 1 tablet (1 mg total) by mouth daily with breakfast.   ketorolac  0.5 % ophthalmic solution Commonly known as: ACULAR  Place 1 drop into the right eye 4 (four) times daily.   lenvatinib  10 mg daily dose capsule Commonly known as: LENVIMA  Take 1 capsule (10 mg total) by mouth daily.   levothyroxine  100 MCG tablet Commonly known as: Synthroid  Take 1 tablet (100 mcg total) by mouth daily before breakfast.   Mucinex DM Maximum Strength 60-1200 MG Tb12 Take 1 tablet by mouth 2 (two) times daily.   prednisoLONE acetate 1 % ophthalmic suspension Commonly known as: PRED FORTE Place 1 drop into the right  eye 4 (four) times daily.   simvastatin  10 MG tablet Commonly known as: ZOCOR  Take 10 mg by mouth at bedtime.   spironolactone 25 MG tablet Commonly known as: ALDACTONE 1 tablet Orally Once a day as needed; Duration: 90 days   Vitamin D  (Ergocalciferol ) 1.25 MG (50000 UNIT) Caps capsule Commonly known as: DRISDOL  Take 50,000 Units by mouth once a week.   VITAMIN D -3 PO Take 1 capsule by mouth every other day.         OBJECTIVE:   Vital Signs: BP 120/82 (BP Location: Left Arm, Patient Position: Sitting, Cuff Size: Normal)   Pulse 80   Ht 5' 6 (1.676 m)   Wt 182 lb (82.6 kg)   SpO2 99%   BMI 29.38 kg/m   Wt Readings from Last 3 Encounters:  02/09/24 182 lb (82.6 kg)  02/05/24 184 lb  12 oz (83.8 kg)  01/26/24 179 lb 6.4 oz (81.4 kg)     Exam: General: Pt appears well and is in NAD  Neck: General: Supple without adenopathy. Thyroid :  No goiter or nodules appreciated.   Lungs: Clear with good BS bilat   Heart: RRR   Extremities: Trace pretibial edema.   Neuro: MS is good with appropriate affect, pt is alert and Ox3    DM foot exam: 02/07/2024 per podiatry        DATA REVIEWED:  Lab Results  Component Value Date   HGBA1C 7.1 (A) 02/09/2024   HGBA1C 7.1 (A) 10/11/2023   HGBA1C 6.5 (A) 06/13/2023    Latest Reference Range & Units 02/05/24 08:12  Sodium 135 - 145 mmol/L 140  Potassium 3.5 - 5.1 mmol/L 4.0  Chloride 98 - 111 mmol/L 112 (H)  CO2 22 - 32 mmol/L 24  Glucose 70 - 99 mg/dL 809 (H)  BUN 8 - 23 mg/dL 27 (H)  Creatinine 9.55 - 1.00 mg/dL 8.54 (H)  Calcium  8.9 - 10.3 mg/dL 9.6  Anion gap 5 - 15  4 (L)  Alkaline Phosphatase 38 - 126 U/L 60  Albumin 3.5 - 5.0 g/dL 3.5  AST 15 - 41 U/L 14 (L)  ALT 0 - 44 U/L 13  Total Protein 6.5 - 8.1 g/dL 5.8 (L)  Total Bilirubin 0.0 - 1.2 mg/dL 0.5  GFR, Est Non African American >60 mL/min 38 (L)     Old records , labs and images have been reviewed.   In office 112 mg /dL     ASSESSMENT / PLAN /  RECOMMENDATIONS:   1) Type 2 Diabetes Mellitus, Sub-Optimally controlled, With CKD III complications - Most recent A1c of 7.1 %. Goal A1c < 7.0 %.    - A1c remains stable  -Unfortunately, she developed severe hypoglycemia while on glipizide , but this is due to taking regular doses of glipizide  despite vomiting -I did switch glipizide  to Jardiance  (with patient assistance), but she developed constipation and opted not to stay on it -She is currently on glimepiride  -Patient under the impression her A1c needs to be between 5.7 and 6.2, I did explain to the patient that these are low A1c's which would increase her risk of hypoglycemia, goal A1c 6.5% - 7.0% - I again emphasized the importance of taking glimepiride  15-20 minutes before the first meal of the day, patient advised to hold glimepiride  should she skip breakfast, patient also advised to hold glimepiride  should she have vomiting to prevent hypoglycemia    MEDICATIONS:  Continue glimepiride  1 mg daily  EDUCATION / INSTRUCTIONS: BG monitoring instructions: Patient is instructed to check her blood sugars 1 times a day. Call [facility] if: BG persistently < 70  I reviewed the Rule of 15 for the treatment of hypoglycemia in detail with the patient. Literature supplied.   2) Diabetic complications:  Eye: Does not have known diabetic retinopathy.  Neuro/ Feet: Does  have known diabetic peripheral neuropathy .  Renal: Patient does  have known baseline CKD. She   is  on an ACEI/ARB at present.    3) CKDIII/Microalbuminuria :  - MA/CR ratio elevated - Will start losartan and recheck potassium in 4 weeks - Of note, patient on spironolactone  F/U in 6 months   I spent 25 minutes preparing to see the patient by review of recent labs, imaging and procedures, obtaining and reviewing separately obtained history, communicating with the patient, ordering medications, tests or procedures, and  documenting clinical information  in the EHR including the differential Dx, treatment, and any further evaluation and other management    Signed electronically by: Stefano Redgie Butts, MD  [facility] End[facility]  [facility] [address] [place] [place], [place] [address] Phone: [phone] FAX: [phone]   CC: Verena Mems, MD [address] [place] [address] Phone: [phone]  Fax: [phone]  Return to Endocrinology clinic as below: Future Appointments  Date Time Provider Department Center  03/18/2024  7:30 AM [facility] FLUSH [facility] None  03/18/2024  8:15 AM [facility] INFUSION [facility] None  03/18/2024  9:00 AM Lonn Hicks, MD [facility] None  05/22/2024 10:15 AM Gaynel Delon CROME, DPM [facility]  08/20/2024 10:15 AM Gaynel Delon CROME, DPM [facility]

## 2024-02-09 NOTE — Patient Instructions (Signed)
 Continue Glimepiride  1 mg,  1 tablet before Breakfast     HOW TO TREAT LOW BLOOD SUGARS (Blood sugar LESS THAN 70 MG/DL) Please follow the RULE OF 15 for the treatment of hypoglycemia treatment (when your (blood sugars are less than 70 mg/dL)   STEP 1: Take 15 grams of carbohydrates when your blood sugar is low, which includes:  3-4 GLUCOSE TABS  OR 3-4 OZ OF JUICE OR REGULAR SODA OR ONE TUBE OF GLUCOSE GEL    STEP 2: RECHECK blood sugar in 15 MINUTES STEP 3: If your blood sugar is still low at the 15 minute recheck --> then, go back to STEP 1 and treat AGAIN with another 15 grams of carbohydrates.

## 2024-02-10 LAB — MICROALBUMIN / CREATININE URINE RATIO
Creatinine, Urine: 50 mg/dL (ref 20–275)
Microalb Creat Ratio: 218 mg/g{creat} — ABNORMAL HIGH (ref <30)
Microalb, Ur: 10.9 mg/dL

## 2024-02-12 ENCOUNTER — Ambulatory Visit: Payer: Self-pay | Admitting: Internal Medicine

## 2024-02-12 ENCOUNTER — Other Ambulatory Visit (HOSPITAL_COMMUNITY): Payer: Self-pay

## 2024-02-12 DIAGNOSIS — E039 Hypothyroidism, unspecified: Secondary | ICD-10-CM | POA: Diagnosis not present

## 2024-02-12 DIAGNOSIS — Z1211 Encounter for screening for malignant neoplasm of colon: Secondary | ICD-10-CM | POA: Diagnosis not present

## 2024-02-12 DIAGNOSIS — I152 Hypertension secondary to endocrine disorders: Secondary | ICD-10-CM | POA: Diagnosis not present

## 2024-02-12 DIAGNOSIS — N1832 Chronic kidney disease, stage 3b: Secondary | ICD-10-CM | POA: Diagnosis not present

## 2024-02-12 DIAGNOSIS — E1169 Type 2 diabetes mellitus with other specified complication: Secondary | ICD-10-CM | POA: Diagnosis not present

## 2024-02-12 DIAGNOSIS — J9801 Acute bronchospasm: Secondary | ICD-10-CM | POA: Diagnosis not present

## 2024-02-12 DIAGNOSIS — D649 Anemia, unspecified: Secondary | ICD-10-CM | POA: Diagnosis not present

## 2024-02-12 DIAGNOSIS — M8580 Other specified disorders of bone density and structure, unspecified site: Secondary | ICD-10-CM | POA: Diagnosis not present

## 2024-02-12 DIAGNOSIS — Z780 Asymptomatic menopausal state: Secondary | ICD-10-CM | POA: Diagnosis not present

## 2024-02-12 DIAGNOSIS — E1122 Type 2 diabetes mellitus with diabetic chronic kidney disease: Secondary | ICD-10-CM

## 2024-02-12 DIAGNOSIS — I2510 Atherosclerotic heart disease of native coronary artery without angina pectoris: Secondary | ICD-10-CM | POA: Diagnosis not present

## 2024-02-12 DIAGNOSIS — Z8542 Personal history of malignant neoplasm of other parts of uterus: Secondary | ICD-10-CM | POA: Diagnosis not present

## 2024-02-12 DIAGNOSIS — Z0000 Encounter for general adult medical examination without abnormal findings: Secondary | ICD-10-CM | POA: Diagnosis not present

## 2024-02-12 MED ORDER — LOSARTAN POTASSIUM 25 MG PO TABS: 25.0000 mg | ORAL_TABLET | Freq: Every day | ORAL | 3 refills | Status: AC

## 2024-02-12 NOTE — Telephone Encounter (Signed)
 Please let the patient know that she is leaking way too much protein in the urine, normally she should leak less than 30 and hers is over 200   This is due to diabetes,, the most important thing is to continue to keep the sugars under good control but I would also suggest that she start a medication that specifically protect her kidneys called losartan   Losartan 25 mg daily   Please schedule her for a recheck on the potassium in 4 weeks, as one of the side effects of losartan is high potassium, especially while on spironolactone.   Thanks

## 2024-02-13 ENCOUNTER — Telehealth: Payer: Self-pay

## 2024-02-13 NOTE — Telephone Encounter (Signed)
 Patient is concern that the Losartan interacts with her other medications,  States that she is already on a BP medication and can't be on the Losartan at the same time per her other doctor. Please advise. Patient is aware is not for BP but concerned.

## 2024-02-13 NOTE — Telephone Encounter (Signed)
 If she is concerned then obviously she is not going to take it we will keep an eye on her kidneys, and if things do not improve, we will send her to a specialist

## 2024-02-13 NOTE — Progress Notes (Signed)
  Subjective:  Patient ID: Krista Johnson, female    DOB: Sep 26, 1950,  MRN: [id number]  Krista Johnson presents to clinic today for at risk foot care. Patient has history of diabetes with CKD and chemotherapy induced neuropathy. She is seen for painful mycotic toenails x 10 which interfere with daily activities. Pain is relieved with periodic professional debridement. She is using Bag Balm moisturizer. Chief Complaint  Patient presents with   Diabetes    DFC NIDDM A1C 7.1. Toenail trim. LOV with PCP 11/09/23.   New problem(s): None.   PCP is Verena Reena, MD.  Allergies  Allergen Reactions   Sulfa Antibiotics Nausea Only   Atenolol  Other (See Comments)    Dropped B/P too much    Review of Systems: Negative except as noted in the HPI.  Objective: No changes noted in today's physical examination. There were no vitals filed for this visit. Krista Johnson is a pleasant 73 y.o. female in NAD. AAO x 3.  Vascular Examination: Capillary refill time immediate b/l. Vascular status intact b/l with palpable pedal pulses. Pedal hair present b/l. No pain with calf compression b/l. Skin temperature gradient WNL b/l. No cyanosis or clubbing b/l. No ischemia or gangrene noted b/l. Trace edema noted BLE.  Neurological Examination:  Pt has subjective symptoms of neuropathy. Protective sensation intact 5/5 intact bilaterally with 10g monofilament b/l.  Dermatological Examination: Pedal skin with normal turgor, texture and tone b/l.  No open wounds. No interdigital macerations.   Toenails 1-5 b/l thick, discolored, elongated with subungual debris and pain on dorsal palpation.   Hyperkeratotic lesion(s) left heel.  No erythema, no edema, no drainage, no fluctuance.  Musculoskeletal Examination: Muscle strength 5/5 to all lower extremity muscle groups bilaterally. Hammertoe(s) 2-5 b/l.SABRA No pain, crepitus or joint limitation noted with ROM b/l LE.  Patient ambulates independently without assistive  aids.  Radiographs: None  Assessment/Plan: 1. Pain due to onychomycosis of toenails of both feet   2. Chemotherapy-induced neuropathy   3. Diabetes mellitus due to underlying condition with stage 3 chronic kidney disease, with long-term current use of insulin , unspecified whether stage 3a or 3b CKD (HCC)   Consent given for treatment. Patient examined. All patient's and/or POA's questions/concerns addressed on today's visit. Mycotic toenails 1-5 b/l debrided in length and girth without incident. Continue foot and shoe inspections daily. Monitor blood glucose per PCP/Endocrinologist's recommendations.Continue soft, supportive shoe gear daily. Report any pedal injuries to medical professional. Call office if there are any quesitons/concerns. -Patient/POA to call should there be question/concern in the interim.   Return in about 3 months (around 05/09/2024).  Delon LITTIE Merlin, DPM      Krista Johnson: [address] [place] [address]                   Office [phone]   [facility] Johnson: [address] [place], [place] [address] Office [phone]

## 2024-02-13 NOTE — Telephone Encounter (Signed)
 LDTVM

## 2024-03-12 ENCOUNTER — Other Ambulatory Visit

## 2024-03-12 ENCOUNTER — Encounter: Attending: Internal Medicine | Admitting: Nutrition

## 2024-03-12 DIAGNOSIS — E11649 Type 2 diabetes mellitus with hypoglycemia without coma: Secondary | ICD-10-CM | POA: Diagnosis not present

## 2024-03-12 LAB — POTASSIUM: Potassium: 3.6 mmol/L (ref 3.5–5.3)

## 2024-03-12 NOTE — Patient Instructions (Signed)
 Change sensor every 15 days Call [facility] help line if questions/problems with the sensor

## 2024-03-12 NOTE — Progress Notes (Signed)
 Patient brought her sensors in when she had a lab appointment.  She was show how to use the [place] 3 sensor.  Her phone does not support the app, and so the readings are going to the reader.  We discussed the difference between sensor and blood sugar readings and when it is necessary to use the meter.  She reported good understanding of this.  The reader was set with date/time and the sensor was inserted by the patient into her left outer arm, and scanned by the reader.  She had no final questions.

## 2024-03-13 ENCOUNTER — Other Ambulatory Visit: Payer: Self-pay

## 2024-03-13 ENCOUNTER — Other Ambulatory Visit (HOSPITAL_COMMUNITY): Payer: Self-pay

## 2024-03-13 MED ORDER — LENVATINIB (10 MG DAILY DOSE) 10 MG PO CPPK: 10.0000 mg | ORAL_CAPSULE | Freq: Every day | ORAL | 11 refills | Status: DC

## 2024-03-18 ENCOUNTER — Inpatient Hospital Stay: Admitting: Hematology and Oncology

## 2024-03-18 ENCOUNTER — Other Ambulatory Visit: Payer: Self-pay

## 2024-03-18 ENCOUNTER — Encounter: Payer: Self-pay | Admitting: Hematology and Oncology

## 2024-03-18 ENCOUNTER — Telehealth: Payer: Self-pay

## 2024-03-18 ENCOUNTER — Inpatient Hospital Stay

## 2024-03-18 ENCOUNTER — Inpatient Hospital Stay: Attending: Gynecology

## 2024-03-18 VITALS — BP 161/98 | HR 79 | Temp 97.6°F | Resp 14

## 2024-03-18 DIAGNOSIS — C541 Malignant neoplasm of endometrium: Secondary | ICD-10-CM | POA: Insufficient documentation

## 2024-03-18 DIAGNOSIS — I10 Essential (primary) hypertension: Secondary | ICD-10-CM | POA: Diagnosis not present

## 2024-03-18 DIAGNOSIS — Z7189 Other specified counseling: Secondary | ICD-10-CM

## 2024-03-18 DIAGNOSIS — Z5112 Encounter for antineoplastic immunotherapy: Secondary | ICD-10-CM | POA: Insufficient documentation

## 2024-03-18 DIAGNOSIS — E032 Hypothyroidism due to medicaments and other exogenous substances: Secondary | ICD-10-CM | POA: Diagnosis not present

## 2024-03-18 DIAGNOSIS — Z79620 Long term (current) use of immunosuppressive biologic: Secondary | ICD-10-CM | POA: Diagnosis not present

## 2024-03-18 DIAGNOSIS — N1831 Chronic kidney disease, stage 3a: Secondary | ICD-10-CM | POA: Insufficient documentation

## 2024-03-18 LAB — CMP (CANCER CENTER ONLY)
ALT: 21 U/L (ref 0–44)
AST: 26 U/L (ref 15–41)
Albumin: 4 g/dL (ref 3.5–5.0)
Alkaline Phosphatase: 85 U/L (ref 38–126)
Anion gap: 10 (ref 5–15)
BUN: 22 mg/dL (ref 8–23)
CO2: 22 mmol/L (ref 22–32)
Calcium: 9.4 mg/dL (ref 8.9–10.3)
Chloride: 108 mmol/L (ref 98–111)
Creatinine: 1.28 mg/dL — ABNORMAL HIGH (ref 0.44–1.00)
GFR, Estimated: 44 mL/min — ABNORMAL LOW (ref >60)
Glucose, Bld: 186 mg/dL — ABNORMAL HIGH (ref 70–99)
Potassium: 3.8 mmol/L (ref 3.5–5.1)
Sodium: 140 mmol/L (ref 135–145)
Total Bilirubin: 0.5 mg/dL (ref 0.0–1.2)
Total Protein: 6.7 g/dL (ref 6.5–8.1)

## 2024-03-18 LAB — CBC WITH DIFFERENTIAL (CANCER CENTER ONLY)
Abs Immature Granulocytes: 0.02 K/uL (ref 0.00–0.07)
Basophils Absolute: 0 K/uL (ref 0.0–0.1)
Basophils Relative: 0 %
Eosinophils Absolute: 0.1 K/uL (ref 0.0–0.5)
Eosinophils Relative: 1 %
HCT: 38.4 % (ref 36.0–46.0)
Hemoglobin: 12.7 g/dL (ref 12.0–15.0)
Immature Granulocytes: 0 %
Lymphocytes Relative: 27 %
Lymphs Abs: 1.4 K/uL (ref 0.7–4.0)
MCH: 27.7 pg (ref 26.0–34.0)
MCHC: 33.1 g/dL (ref 30.0–36.0)
MCV: 83.7 fL (ref 80.0–100.0)
Monocytes Absolute: 0.2 K/uL (ref 0.1–1.0)
Monocytes Relative: 5 %
Neutro Abs: 3.4 K/uL (ref 1.7–7.7)
Neutrophils Relative %: 67 %
Platelet Count: 171 K/uL (ref 150–400)
RBC: 4.59 MIL/uL (ref 3.87–5.11)
RDW: 14.2 % (ref 11.5–15.5)
WBC Count: 5.1 K/uL (ref 4.0–10.5)
nRBC: 0 % (ref 0.0–0.2)

## 2024-03-18 LAB — TOTAL PROTEIN, URINE DIPSTICK: Protein, ur: 30 mg/dL — AB

## 2024-03-18 LAB — TSH: TSH: 22 u[IU]/mL — ABNORMAL HIGH (ref 0.350–4.500)

## 2024-03-18 MED ORDER — SODIUM CHLORIDE 0.9 % IV SOLN
400.0000 mg | Freq: Once | INTRAVENOUS | Status: AC
  Administered 2024-03-18: 400 mg via INTRAVENOUS
  Filled 2024-03-18: qty 16

## 2024-03-18 MED ORDER — LEVOTHYROXINE SODIUM 125 MCG PO TABS: 125.0000 ug | ORAL_TABLET | Freq: Every day | ORAL | 0 refills | Status: DC

## 2024-03-18 MED ORDER — SODIUM CHLORIDE 0.9 % IV SOLN: Freq: Once | INTRAVENOUS | Status: AC

## 2024-03-18 NOTE — Patient Instructions (Signed)
 [facility] - A DEPT OF [facility]  Discharge Instructions: Thank you for choosing [facility] to provide your oncology and hematology care.   If you have a lab appointment with the Cancer Center, please go directly to the Cancer Center and check in at the registration area.   Wear comfortable clothing and clothing appropriate for easy access to any Portacath or PICC line.   We strive to give you quality time with your provider. You may need to reschedule your appointment if you arrive late (15 or more minutes).  Arriving late affects you and other patients whose appointments are after yours.  Also, if you miss three or more appointments without notifying the office, you may be dismissed from the clinic at the provider's discretion.      For prescription refill requests, have your pharmacy contact our office and allow 72 hours for refills to be completed.    Today you received the following chemotherapy and/or immunotherapy agents: pembrolizumab      To help prevent nausea and vomiting after your treatment, we encourage you to take your nausea medication as directed.  BELOW ARE SYMPTOMS THAT SHOULD BE REPORTED IMMEDIATELY: *FEVER GREATER THAN 100.4 F (38 C) OR HIGHER *CHILLS OR SWEATING *NAUSEA AND VOMITING THAT IS NOT CONTROLLED WITH YOUR NAUSEA MEDICATION *UNUSUAL SHORTNESS OF BREATH *UNUSUAL BRUISING OR BLEEDING *URINARY PROBLEMS (pain or burning when urinating, or frequent urination) *BOWEL PROBLEMS (unusual diarrhea, constipation, pain near the anus) TENDERNESS IN MOUTH AND THROAT WITH OR WITHOUT PRESENCE OF ULCERS (sore throat, sores in mouth, or a toothache) UNUSUAL RASH, SWELLING OR PAIN  UNUSUAL VAGINAL DISCHARGE OR ITCHING   Items with * indicate a potential emergency and should be followed up as soon as possible or go to the Emergency Department if any problems should occur.  Please show the CHEMOTHERAPY ALERT CARD or  IMMUNOTHERAPY ALERT CARD at check-in to the Emergency Department and triage nurse.  Should you have questions after your visit or need to cancel or reschedule your appointment, please contact [facility] - A DEPT OF [facility]  Dept: [phone]  and follow the prompts.  Office hours are 8:00 a.m. to 4:30 p.m. Monday - Friday. Please note that voicemails left after 4:00 p.m. may not be returned until the following business day.  We are closed weekends and major holidays. You have access to a nurse at all times for urgent questions. Please call the main number to the clinic Dept: [phone] and follow the prompts.   For any non-urgent questions, you may also contact your provider using MyChart. We now offer e-Visits for anyone 78 and older to request care online for non-urgent symptoms. For details visit [email or web address].   Also download the MyChart app! Go to the app store, search "MyChart", open the app, select [place], and log in with your MyChart username and password.

## 2024-03-18 NOTE — Assessment & Plan Note (Addendum)
 She has multiple cardiovascular risk factors Recent blood pressure was poorly controlled due to Lenvima  but that has since improved back to normal We will continue close monitoring

## 2024-03-18 NOTE — Assessment & Plan Note (Addendum)
 She has stable chronic kidney disease stage III Advised the patient to increase oral fluid intake and monitor blood pressure carefully

## 2024-03-18 NOTE — Assessment & Plan Note (Signed)
 Her TSH is profoundly elevated due to checkpoint inhibitors I will adjust the dose of her Synthroid  and we will continue close monitoring

## 2024-03-18 NOTE — Progress Notes (Signed)
 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%), MSI stable, ER 80%, PR 60%, Her2/neu neg, PD-L1 CPS 15%  She has disease relapse after completion of chemotherapy and was switched to pembrolizumab  and lenvatinib  with complete response, discontinued in 2023 She had biopsy proven disease relapse in 2024 and treatment with pembrolizumab  and lenvatinib  was restarted again in October 2024  Overall, she tolerated treatment very well without major side effects except for poorly controlled hypertension and abnormal thyroid  function Imaging study from October 2025 showed positive response to treatment The plan will be to continue on current treatment of pembrolizumab  and lenvatinib  indefinitely  Her Lenvima  treatment was temporarily placed on hold recently due to hypertension Blood pressure has much improved now and she was able to resume Lenvima  without problems We discussed importance of close monitoring of blood pressure while on treatment We will proceed with treatment without delay Her next imaging will be in February 2026 Stage 3a chronic kidney disease (HCC) She has stable chronic kidney disease stage III Advised the patient to increase oral fluid intake and monitor blood pressure carefully Primary hypertension She has multiple cardiovascular risk factors Recent blood pressure was poorly controlled due to Lenvima  but that has since improved back to normal We will continue close monitoring  No orders of the defined types were placed in this encounter.    Almarie Bedford, MD  INTERVAL  HISTORY: she returns for treatment follow-up Complications related to previous cycle of chemotherapy included anemia,, elevated BP, and elevated serum creatinine  PHYSICAL EXAMINATION: ECOG PERFORMANCE STATUS: 0 - Asymptomatic  No results found for: [id number]    Latest Ref Rng & Units 03/18/2024    7:51 AM 02/05/2024    8:12 AM 01/26/2024    8:38 AM  CBC  WBC 4.0 - 10.5 K/uL 5.1  3.8  11.4   Hemoglobin 12.0 - 15.0 g/dL 87.2  88.5  87.5   Hematocrit 36.0 - 46.0 % 38.4  36.0  38.1   Platelets 150 - 400 K/uL 171  149  203       Chemistry      Component Value Date/Time   NA 140 03/18/2024 0751   K 3.8 03/18/2024 0751   CL 108 03/18/2024 0751   CO2 22 03/18/2024 0751   BUN 22 03/18/2024 0751   CREATININE 1.28 (H) 03/18/2024 0751      Component Value Date/Time   CALCIUM  9.4 03/18/2024 0751   ALKPHOS 85 03/18/2024 0751   AST 26 03/18/2024 0751   ALT 21 03/18/2024 0751   BILITOT 0.5 03/18/2024 0751       There were no vitals filed for this visit. There were no vitals filed for this visit. Other relevant data reviewed during this visit included CBC and CMP

## 2024-03-18 NOTE — Assessment & Plan Note (Addendum)
 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%), MSI stable, ER 80%, PR 60%, Her2/neu neg, PD-L1 CPS 15%  She has disease relapse after completion of chemotherapy and was switched to pembrolizumab  and lenvatinib  with complete response, discontinued in 2023 She had biopsy proven disease relapse in 2024 and treatment with pembrolizumab  and lenvatinib  was restarted again in October 2024  Overall, she tolerated treatment very well without major side effects except for poorly controlled hypertension and abnormal thyroid  function Imaging study from October 2025 showed positive response to treatment The plan will be to continue on current treatment of pembrolizumab  and lenvatinib  indefinitely  Her Lenvima  treatment was temporarily placed on hold recently due to hypertension Blood pressure has much improved now and she was able to resume Lenvima  without problems We discussed importance of close monitoring of blood pressure while on treatment We will proceed with treatment without delay Her next imaging will be in February 2026

## 2024-03-18 NOTE — Telephone Encounter (Signed)
 Called per Dr. Lonn, TSH high and ask how she is taking Synthroid . She is taking 100 mcg daily in am and waiting 30 mins before eating. Told her Dr. Rick increased dose to 125 mcg, she verbalized understanding and will pick up Rx and start.

## 2024-03-19 LAB — T4: T4, Total: 6.7 ug/dL (ref 4.5–12.0)

## 2024-04-03 ENCOUNTER — Ambulatory Visit: Admitting: Allergy

## 2024-04-03 ENCOUNTER — Encounter: Payer: Self-pay | Admitting: Allergy

## 2024-04-03 ENCOUNTER — Other Ambulatory Visit: Payer: Self-pay

## 2024-04-03 VITALS — BP 140/100 | HR 94 | Temp 97.3°F | Resp 16 | Ht 65.0 in | Wt 179.5 lb

## 2024-04-03 DIAGNOSIS — J3089 Other allergic rhinitis: Secondary | ICD-10-CM

## 2024-04-03 DIAGNOSIS — J302 Other seasonal allergic rhinitis: Secondary | ICD-10-CM

## 2024-04-03 DIAGNOSIS — J9801 Acute bronchospasm: Secondary | ICD-10-CM

## 2024-04-03 DIAGNOSIS — B349 Viral infection, unspecified: Secondary | ICD-10-CM

## 2024-04-03 MED ORDER — AZELASTINE HCL 0.1 % NA SOLN: NASAL | 5 refills | Status: AC

## 2024-04-03 NOTE — Progress Notes (Signed)
 New Patient Note  RE: [name] MRN: [id number] DOB: 1950/09/06 Date of Office Visit: 04/03/2024  Primary care provider: Verena Reena, MD  Chief Complaint: allergies, eczema, sinus issues  History of present illness: Krista Johnson is a 73 y.o. female presenting today for evaluation of bronchospasm.   Discussed the use of AI scribe software for clinical note transcription with the patient, who gave verbal consent to proceed.  She has had lifelong allergies and experienced eczema as a child, requiring frequent steroid shots. In her twenties, her symptoms transitioned to sinus issues, for which she took Allegra and Claritin. Recently, in October, she experienced a bronchial spasm following a sinus infection, which was treated with amoxicillin . During the sinus infection, she was prescribed albuterol  and Symbicort  inhalers. She used the albuterol  only a couple of times and did not get the Symbicort  due to cost. She has not needed to use the albuterol  since the bronchospasm resolved.  She continues to experience a runny nose and mucus accumulation in the mornings, which she manages by drinking water . She has a nasal spray, azelastine , which she uses more frequently in the spring and fall, but not regularly.  Her past medical history includes severe eczema as a child, requiring frequent steroid shots. She has not had recent allergy testing since her teenage years but recalls being allergic to various environmental factors such as trees, mold, mildew, dust, and grass. She no longer avoids foods she was allergic to as a child, such as chocolate and chicken.  Review of systems: 10pt ROS negative unless noted above in HPI  Past medical history: Past Medical History:  Diagnosis Date   Arthritis    right knee--- last cortisone injection 04/ 2019   CKD (chronic kidney disease)    Diabetes mellitus without complication [facility])    endocrinologist-  dr kassie   Endometrial cancer [facility])     History of colon polyps    Hyperlipidemia    Hyperparathyroidism    Hypertension     Past surgical history: Past Surgical History:  Procedure Laterality Date   AXILLARY LYMPH NODE BIOPSY Right 04/22/2022   Procedure: RIGHT AXILLARY LYMPH NODE BIOPSY;  Surgeon: Lyndel Deward PARAS, MD;  Location: [facility] OR;  Service: General;  Laterality: Right;   COLONOSCOPY  last one 12-25-2017   IR IMAGING GUIDED PORT INSERTION  02/20/2018   ROBOTIC ASSISTED TOTAL HYSTERECTOMY WITH BILATERAL SALPINGO OOPHERECTOMY N/A 01/23/2018   Procedure: XI ROBOTIC ASSISTED TOTAL HYSTERECTOMY WITH BILATERAL SALPINGO OOPHORECTOMY;  Surgeon: Eloy Herring, MD;  Location: [facility] ORS;  Service: Gynecology;  Laterality: N/A;   SENTINEL NODE BIOPSY N/A 01/23/2018   Procedure: SENTINEL NODE BIOPSY;  Surgeon: Eloy Herring, MD;  Location: [facility] ORS;  Service: Gynecology;  Laterality: N/A;    Family history:  Family History  Problem Relation Age of Onset   Diabetes Mother    Hypertension Mother    Diabetes Sister    Hypertension Sister    Diabetes Maternal Uncle    Colon cancer Paternal Aunt    Diabetes Paternal Aunt    Stomach cancer Neg Hx    Rectal cancer Neg Hx    Esophageal cancer Neg Hx    Colon polyps Neg Hx     Social history: Lives in a home with out carpeting but has rugs with gas sting and central cooling.  No pets in the home.  There is no concern for water  damage, mildew or roaches in the home.  She is retired.  Denies  any smoking history.  Medication List: Current Outpatient Medications  Medication Sig Dispense Refill   amLODipine-valsartan (EXFORGE) 5-160 MG tablet Take 1 tablet by mouth daily.     aspirin  EC 81 MG tablet Take 81 mg by mouth daily. Swallow whole.     Azelastine  HCl 0.15 % SOLN Place 1-2 sprays into both nostrils daily as needed (sinus congestion/issues.). 30 mL 2   Continuous Glucose Receiver (FREESTYLE LIBRE 3 READER) DEVI See admin instructions.     Continuous Glucose Sensor (FREESTYLE LIBRE  3 SENSOR) MISC See admin instructions.     glimepiride  (AMARYL ) 1 MG tablet Take 1 tablet (1 mg total) by mouth daily with breakfast. 90 tablet 3   lenvatinib  10 mg daily dose (LENVIMA ) capsule Take 1 capsule (10 mg total) by mouth daily. 30 capsule 11   levothyroxine  (SYNTHROID ) 125 MCG tablet Take 1 tablet (125 mcg total) by mouth daily before breakfast. 60 tablet 0   Vitamin D , Ergocalciferol , (DRISDOL ) 1.25 MG (50000 UNIT) CAPS capsule Take 50,000 Units by mouth once a week.     budesonide -formoterol  (SYMBICORT ) 80-4.5 MCG/ACT inhaler Inhale 2 puffs into the lungs in the morning and at bedtime. Rinse mouth with water  after use (Patient not taking: Reported on 04/03/2024) 1 each 0   losartan  (COZAAR ) 25 MG tablet Take 1 tablet (25 mg total) by mouth daily. (Patient not taking: Reported on 04/03/2024) 90 tablet 3   rosuvastatin (CRESTOR) 10 MG tablet Take 10 mg by mouth daily. (Patient not taking: Reported on 04/03/2024)     spironolactone (ALDACTONE) 25 MG tablet 1 tablet Orally Once a day as needed; Duration: 90 days (Patient not taking: Reported on 04/03/2024)     No current facility-administered medications for this visit.    Known medication allergies: Allergies[1]   Physical examination: Blood pressure (!) 140/100, pulse 94, temperature (!) 97.3 F (36.3 C), temperature source Temporal, resp. rate 16, height 5' 5 (1.651 m), weight 179 lb 8 oz (81.4 kg), SpO2 100%.  General: Alert, interactive, in no acute distress. HEENT: PERRLA, TMs pearly gray, turbinates minimally edematous with clear discharge, post-pharynx non erythematous. Neck: Supple without lymphadenopathy. Lungs: Clear to auscultation without wheezing, rhonchi or rales. {no increased work of breathing. CV: Normal S1, S2 without murmurs. Abdomen: Nondistended, nontender. Skin: Warm and dry, without lesions or rashes. Extremities:  No clubbing, cyanosis or edema. Neuro:   Grossly intact.  Diagnostics/Labs: Labs:  ***  Spirometry: FEV1: 1.67L 106%, FVC: 1.99L 99%, ratio consistent with nonobstructive pattern  Assessment and plan: Allergic rhinitis Chronic allergic rhinitis with persistent rhinorrhea, exacerbated in spring and fall. Previous allergy testing indicated environmental allergen sensitivities. - Refilled azelastine  nasal spray for runny nose.  Use 1-2 sprays each nostril twice a day for runny nose control.   With using nasal sprays point tip of bottle toward eye on same side nostril and lean head slightly forward for best technique.   - Scheduled skin allergy testing to update allergen profile when convenient for you.  Hold all antihistamines for three days prior to testing.  Bronchospasm with illness - If you have an upper respiratory illness then use Albuterol  inhaler 2 puffs every 4 hours as needed for cough, difficulty breathing, wheezing control.    Schedule skin testing visit and hold antihistamines for 3 days prior.  (Env 1-55)  I appreciate the opportunity to take part in Yu's care. Please do not hesitate to contact me with questions.  Sincerely,   Danita Brain, MD Allergy/Immunology [facility]     [1]  Allergies Allergen Reactions   Sulfa Antibiotics Nausea Only   Atenolol  Other (See Comments)    Dropped B/P too much

## 2024-04-03 NOTE — Patient Instructions (Signed)
 Allergic rhinitis Chronic allergic rhinitis with persistent rhinorrhea, exacerbated in spring and fall. Previous allergy testing indicated environmental allergen sensitivities. - Refilled azelastine  nasal spray for runny nose.  Use 1-2 sprays each nostril twice a day for runny nose control.   With using nasal sprays point tip of bottle toward eye on same side nostril and lean head slightly forward for best technique.   - Scheduled skin allergy testing to update allergen profile when convenient for you.  Hold all antihistamines for three days prior to testing.  Bronchospasm with illness - If you have an upper respiratory illness then use Albuterol  inhaler 2 puffs every 4 hours as needed for cough, difficulty breathing, wheezing control.    Schedule skin testing visit and hold antihistamines for 3 days prior.

## 2024-04-12 ENCOUNTER — Telehealth: Payer: Self-pay

## 2024-04-12 NOTE — Telephone Encounter (Addendum)
 PAP reenrollment for year 2026    Application has been submitted for Lenvima  through [place] with both Patient and doctor signatures, along with requested documentation.  Status: Pending  *05-09-24 Approved through 04/17/2025  Charlott Hamilton,  CPhT-Adv  she/her/hers [facility]  [facility] Pharmacy Technician [facility] Phone: [phone]  Fax: [phone] [email or web address]

## 2024-04-29 ENCOUNTER — Inpatient Hospital Stay: Attending: Gynecology

## 2024-04-29 ENCOUNTER — Telehealth: Payer: Self-pay

## 2024-04-29 ENCOUNTER — Inpatient Hospital Stay: Admitting: Hematology and Oncology

## 2024-04-29 ENCOUNTER — Other Ambulatory Visit: Payer: Self-pay

## 2024-04-29 ENCOUNTER — Inpatient Hospital Stay

## 2024-04-29 VITALS — BP 187/98 | HR 65 | Temp 98.0°F | Resp 16 | Wt 179.2 lb

## 2024-04-29 DIAGNOSIS — R809 Proteinuria, unspecified: Secondary | ICD-10-CM | POA: Diagnosis not present

## 2024-04-29 DIAGNOSIS — Z923 Personal history of irradiation: Secondary | ICD-10-CM | POA: Diagnosis not present

## 2024-04-29 DIAGNOSIS — N1831 Chronic kidney disease, stage 3a: Secondary | ICD-10-CM

## 2024-04-29 DIAGNOSIS — C541 Malignant neoplasm of endometrium: Secondary | ICD-10-CM

## 2024-04-29 DIAGNOSIS — I10 Essential (primary) hypertension: Secondary | ICD-10-CM

## 2024-04-29 DIAGNOSIS — Z79620 Long term (current) use of immunosuppressive biologic: Secondary | ICD-10-CM | POA: Diagnosis not present

## 2024-04-29 DIAGNOSIS — Z7189 Other specified counseling: Secondary | ICD-10-CM

## 2024-04-29 DIAGNOSIS — I129 Hypertensive chronic kidney disease with stage 1 through stage 4 chronic kidney disease, or unspecified chronic kidney disease: Secondary | ICD-10-CM | POA: Insufficient documentation

## 2024-04-29 DIAGNOSIS — Z5112 Encounter for antineoplastic immunotherapy: Secondary | ICD-10-CM | POA: Diagnosis present

## 2024-04-29 LAB — CBC WITH DIFFERENTIAL (CANCER CENTER ONLY)
Abs Immature Granulocytes: 0.01 K/uL (ref 0.00–0.07)
Basophils Absolute: 0 K/uL (ref 0.0–0.1)
Basophils Relative: 0 %
Eosinophils Absolute: 0.1 K/uL (ref 0.0–0.5)
Eosinophils Relative: 2 %
HCT: 38.5 % (ref 36.0–46.0)
Hemoglobin: 13 g/dL (ref 12.0–15.0)
Immature Granulocytes: 0 %
Lymphocytes Relative: 32 %
Lymphs Abs: 1.3 K/uL (ref 0.7–4.0)
MCH: 28.4 pg (ref 26.0–34.0)
MCHC: 33.8 g/dL (ref 30.0–36.0)
MCV: 84.1 fL (ref 80.0–100.0)
Monocytes Absolute: 0.2 K/uL (ref 0.1–1.0)
Monocytes Relative: 5 %
Neutro Abs: 2.6 K/uL (ref 1.7–7.7)
Neutrophils Relative %: 61 %
Platelet Count: 153 K/uL (ref 150–400)
RBC: 4.58 MIL/uL (ref 3.87–5.11)
RDW: 14.5 % (ref 11.5–15.5)
WBC Count: 4.2 K/uL (ref 4.0–10.5)
nRBC: 0 % (ref 0.0–0.2)

## 2024-04-29 LAB — CMP (CANCER CENTER ONLY)
ALT: 21 U/L (ref 0–44)
AST: 27 U/L (ref 15–41)
Albumin: 3.9 g/dL (ref 3.5–5.0)
Alkaline Phosphatase: 80 U/L (ref 38–126)
Anion gap: 12 (ref 5–15)
BUN: 16 mg/dL (ref 8–23)
CO2: 22 mmol/L (ref 22–32)
Calcium: 9.2 mg/dL (ref 8.9–10.3)
Chloride: 108 mmol/L (ref 98–111)
Creatinine: 1.24 mg/dL — ABNORMAL HIGH (ref 0.44–1.00)
GFR, Estimated: 46 mL/min — ABNORMAL LOW
Glucose, Bld: 186 mg/dL — ABNORMAL HIGH (ref 70–99)
Potassium: 3.5 mmol/L (ref 3.5–5.1)
Sodium: 142 mmol/L (ref 135–145)
Total Bilirubin: 0.6 mg/dL (ref 0.0–1.2)
Total Protein: 6.6 g/dL (ref 6.5–8.1)

## 2024-04-29 LAB — TOTAL PROTEIN, URINE DIPSTICK: Protein, ur: 300 mg/dL — AB

## 2024-04-29 LAB — TSH: TSH: 20 u[IU]/mL — ABNORMAL HIGH (ref 0.350–4.500)

## 2024-04-29 MED ORDER — LENVATINIB (10 MG DAILY DOSE) 10 MG PO CPPK: 10.0000 mg | ORAL_CAPSULE | Freq: Every day | ORAL | Status: AC

## 2024-04-29 MED ORDER — LEVOTHYROXINE SODIUM 150 MCG PO TABS: 150.0000 ug | ORAL_TABLET | Freq: Every day | ORAL | 0 refills | Status: AC

## 2024-04-29 MED ORDER — SODIUM CHLORIDE 0.9 % IV SOLN
400.0000 mg | Freq: Once | INTRAVENOUS | Status: AC
  Administered 2024-04-29: 400 mg via INTRAVENOUS
  Filled 2024-04-29: qty 16

## 2024-04-29 MED ORDER — SODIUM CHLORIDE 0.9 % IV SOLN: Freq: Once | INTRAVENOUS | Status: AC

## 2024-04-29 NOTE — Assessment & Plan Note (Addendum)
 Her blood pressure is very high today We discussed importance of dietary modification and aggressive blood pressure management

## 2024-04-29 NOTE — Telephone Encounter (Signed)
 Called and left below message. Ask her to call the office back for questions. Sent Rx to preferred pharmacy.

## 2024-04-29 NOTE — Progress Notes (Signed)
 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%), MSI stable, ER 80%, PR 60%, Her2/neu neg, PD-L1 CPS 15%  She has disease relapse after completion of chemotherapy and was switched to pembrolizumab  and lenvatinib  with complete response, discontinued in 2023 She had biopsy proven disease relapse in 2024 and treatment with pembrolizumab  and lenvatinib  was restarted again in October 2024  Overall, she tolerated treatment very well without major side effects except for poorly controlled hypertension and abnormal thyroid  function Imaging study from October 2025 showed positive response to treatment The plan will be to continue on current treatment of pembrolizumab  and lenvatinib  indefinitely  Last year, Lenvima  treatment was temporarily placed on hold recently due to hypertension Today, she has significant proteinuria again She has not been checking her blood pressure recently We will proceed with treatment today but she will not take Lenvima  for at least a week She is given very specific instruction to check her blood pressure twice a day moving forward and to modify her diet I plan to order CT imaging next month We will proceed with pembrolizumab  today Stage 3a chronic kidney disease (HCC) She has stable chronic kidney disease stage III Advised the patient to increase oral fluid intake and monitor blood pressure carefully Proteinuria, unspecified type She is noted to have heavy proteinuria I explained to her the rationale of not pursuing Lenvima  for now until she can get her blood pressure and  proteinuria resolve Primary hypertension Her blood pressure is very high today We discussed importance of dietary modification and aggressive blood pressure management  Orders Placed This Encounter  Procedures   CT ABDOMEN PELVIS W CONTRAST    Standing Status:   Future    Expected Date:   06/03/2024    Expiration Date:   04/29/2025    Scheduling Instructions:     No oral contrast    If indicated for the ordered procedure, I authorize the administration of contrast media per Radiology protocol:   Yes    Does the patient have a contrast media/X-ray dye allergy ?:   No    Preferred imaging location?:   [facility]    If indicated for the ordered procedure, I authorize the administration of oral contrast media per Radiology protocol:   No    Reason for no oral contrast::   No need oral contrast     Almarie Bedford, MD  INTERVAL HISTORY: she returns for treatment follow-up Complications related to previous cycle of chemotherapy included elevated BP, elevated serum creatinine, and significant proteinuria She admits that she has not been checking her blood pressure lately and was undergoing some stress due to death of her sister We discussed rationale behind with holding Lenvima  today  PHYSICAL EXAMINATION: ECOG PERFORMANCE STATUS: 1 - Symptomatic but completely ambulatory  No results found for: [id number]    Latest Ref Rng & Units 04/29/2024    7:59 AM 03/18/2024    7:51 AM 02/05/2024    8:12 AM  CBC  WBC 4.0 - 10.5 K/uL 4.2  5.1  3.8   Hemoglobin 12.0 - 15.0 g/dL 86.9  87.2  88.5   Hematocrit 36.0 - 46.0 % 38.5  38.4  36.0   Platelets 150 - 400 K/uL 153  171  149       Chemistry      Component Value Date/Time   NA 142 04/29/2024 0759   K 3.5 04/29/2024 0759   CL 108 04/29/2024 0759   CO2 22 04/29/2024 0759   BUN 16 04/29/2024 0759   CREATININE 1.24 (H) 04/29/2024 0759      Component Value Date/Time   CALCIUM  9.2 04/29/2024 0759   ALKPHOS 80 04/29/2024 0759   AST 27  04/29/2024 0759   ALT 21 04/29/2024 0759   BILITOT 0.6 04/29/2024 0759       There were no vitals filed for this visit. There were no vitals filed for this visit. Other relevant data reviewed during this visit included CBC, CMP, total urine protein

## 2024-04-29 NOTE — Telephone Encounter (Signed)
-----   Message from Almarie Bedford, MD sent at 04/29/2024 11:53 AM EST ----- TSh is still high Send in prescription for 150 mcg and update her med list 60 tabs no refills

## 2024-04-29 NOTE — Assessment & Plan Note (Addendum)
 She is noted to have heavy proteinuria I explained to her the rationale of not pursuing Lenvima  for now until she can get her blood pressure and proteinuria resolve

## 2024-04-29 NOTE — Patient Instructions (Signed)
 [facility] - A DEPT OF [facility]  Discharge Instructions: Thank you for choosing [facility] to provide your oncology and hematology care.   If you have a lab appointment with the Cancer Center, please go directly to the Cancer Center and check in at the registration area.   Wear comfortable clothing and clothing appropriate for easy access to any Portacath or PICC line.   We strive to give you quality time with your provider. You may need to reschedule your appointment if you arrive late (15 or more minutes).  Arriving late affects you and other patients whose appointments are after yours.  Also, if you miss three or more appointments without notifying the office, you may be dismissed from the clinic at the provider's discretion.      For prescription refill requests, have your pharmacy contact our office and allow 72 hours for refills to be completed.    Today you received the following chemotherapy and/or immunotherapy agents: pembrolizumab      To help prevent nausea and vomiting after your treatment, we encourage you to take your nausea medication as directed.  BELOW ARE SYMPTOMS THAT SHOULD BE REPORTED IMMEDIATELY: *FEVER GREATER THAN 100.4 F (38 C) OR HIGHER *CHILLS OR SWEATING *NAUSEA AND VOMITING THAT IS NOT CONTROLLED WITH YOUR NAUSEA MEDICATION *UNUSUAL SHORTNESS OF BREATH *UNUSUAL BRUISING OR BLEEDING *URINARY PROBLEMS (pain or burning when urinating, or frequent urination) *BOWEL PROBLEMS (unusual diarrhea, constipation, pain near the anus) TENDERNESS IN MOUTH AND THROAT WITH OR WITHOUT PRESENCE OF ULCERS (sore throat, sores in mouth, or a toothache) UNUSUAL RASH, SWELLING OR PAIN  UNUSUAL VAGINAL DISCHARGE OR ITCHING   Items with * indicate a potential emergency and should be followed up as soon as possible or go to the Emergency Department if any problems should occur.  Please show the CHEMOTHERAPY ALERT CARD or  IMMUNOTHERAPY ALERT CARD at check-in to the Emergency Department and triage nurse.  Should you have questions after your visit or need to cancel or reschedule your appointment, please contact [facility] - A DEPT OF [facility]  Dept: [phone]  and follow the prompts.  Office hours are 8:00 a.m. to 4:30 p.m. Monday - Friday. Please note that voicemails left after 4:00 p.m. may not be returned until the following business day.  We are closed weekends and major holidays. You have access to a nurse at all times for urgent questions. Please call the main number to the clinic Dept: [phone] and follow the prompts.   For any non-urgent questions, you may also contact your provider using MyChart. We now offer e-Visits for anyone 78 and older to request care online for non-urgent symptoms. For details visit [email or web address].   Also download the MyChart app! Go to the app store, search "MyChart", open the app, select [place], and log in with your MyChart username and password.

## 2024-04-29 NOTE — Assessment & Plan Note (Addendum)
 She has stable chronic kidney disease stage III Advised the patient to increase oral fluid intake and monitor blood pressure carefully

## 2024-04-29 NOTE — Assessment & Plan Note (Addendum)
 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%), MSI stable, ER 80%, PR 60%, Her2/neu neg, PD-L1 CPS 15%  She has disease relapse after completion of chemotherapy and was switched to pembrolizumab  and lenvatinib  with complete response, discontinued in 2023 She had biopsy proven disease relapse in 2024 and treatment with pembrolizumab  and lenvatinib  was restarted again in October 2024  Overall, she tolerated treatment very well without major side effects except for poorly controlled hypertension and abnormal thyroid  function Imaging study from October 2025 showed positive response to treatment The plan will be to continue on current treatment of pembrolizumab  and lenvatinib  indefinitely  Last year, Lenvima  treatment was temporarily placed on hold recently due to hypertension Today, she has significant proteinuria again She has not been checking her blood pressure recently We will proceed with treatment today but she will not take Lenvima  for at least a week She is given very specific instruction to check her blood pressure twice a day moving forward and to modify her diet I plan to order CT imaging next month We will proceed with pembrolizumab  today

## 2024-04-30 LAB — T4: T4, Total: 6.6 ug/dL (ref 4.5–12.0)

## 2024-05-02 ENCOUNTER — Encounter: Payer: Self-pay | Admitting: Allergy

## 2024-05-02 ENCOUNTER — Ambulatory Visit (INDEPENDENT_AMBULATORY_CARE_PROVIDER_SITE_OTHER): Admitting: Allergy

## 2024-05-02 DIAGNOSIS — J3089 Other allergic rhinitis: Secondary | ICD-10-CM

## 2024-05-02 DIAGNOSIS — J302 Other seasonal allergic rhinitis: Secondary | ICD-10-CM

## 2024-05-02 NOTE — Progress Notes (Signed)
 "   Follow-up Note  RE: Krista Johnson MRN: [id number] DOB: [birth year] Date of Office Visit: 05/02/2024   History of present illness: Krista Johnson is a 74 y.o. female presenting today for skin testing visit.  She was last seen in the office on 04/03/24 for allergic rhinitis and bronchospasm with illness.  She is in her usual state of health today without recent illness.  She has held antihistamines for at least 3 days for testing today.   Medication List: Current Outpatient Medications  Medication Sig Dispense Refill   amLODipine-valsartan (EXFORGE) 5-160 MG tablet Take 1 tablet by mouth daily.     aspirin  EC 81 MG tablet Take 81 mg by mouth daily. Swallow whole.     azelastine  (ASTELIN ) 0.1 % nasal spray Use 1-2 sprays each nostril twice a day for runny nose control. 30 mL 5   Azelastine  HCl 0.15 % SOLN Place 1-2 sprays into both nostrils daily as needed (sinus congestion/issues.). 30 mL 2   budesonide -formoterol  (SYMBICORT ) 80-4.5 MCG/ACT inhaler Inhale 2 puffs into the lungs in the morning and at bedtime. Rinse mouth with water  after use (Patient not taking: Reported on 04/03/2024) 1 each 0   Continuous Glucose Receiver (FREESTYLE LIBRE 3 READER) DEVI See admin instructions.     Continuous Glucose Sensor (FREESTYLE LIBRE 3 SENSOR) MISC See admin instructions.     glimepiride  (AMARYL ) 1 MG tablet Take 1 tablet (1 mg total) by mouth daily with breakfast. 90 tablet 3   lenvatinib  10 mg daily dose (LENVIMA ) capsule Take 1 capsule (10 mg total) by mouth daily. On hold 04/29/24: proteinuria     levothyroxine  (SYNTHROID ) 150 MCG tablet Take 1 tablet (150 mcg total) by mouth daily before breakfast. 60 tablet 0   losartan  (COZAAR ) 25 MG tablet Take 1 tablet (25 mg total) by mouth daily. (Patient not taking: Reported on 04/03/2024) 90 tablet 3   rosuvastatin (CRESTOR) 10 MG tablet Take 10 mg by mouth daily. (Patient not taking: Reported on 04/03/2024)     spironolactone (ALDACTONE) 25 MG tablet 1  tablet Orally Once a day as needed; Duration: 90 days (Patient not taking: Reported on 04/03/2024)     Vitamin D , Ergocalciferol , (DRISDOL ) 1.25 MG (50000 UNIT) CAPS capsule Take 50,000 Units by mouth once a week.     No current facility-administered medications for this visit.     Known medication allergies: Allergies[1]  Diagnostics/Labs:  Allergy  testing:   Airborne Adult Perc - 05/02/24 0929     Time Antigen Placed 9070    Allergen Manufacturer Jestine    Location Back    Number of Test 55    2. Control-Histamine 2+    3. Bahia Negative    4. Bermuda Negative    5. Johnson Negative    6. [place]  Blue Negative    7. Meadow Fescue Negative    8. Perennial Rye Negative    9. Timothy Negative    10. Ragweed Mix 2+    11. Cocklebur Negative    12. Plantain,  English Negative    13. Baccharis Negative    14. Dog Fennel Negative    15. Russian Thistle Negative    16. Lamb's Quarters 2+    17. Sheep Sorrell Negative    18. Rough Pigweed Negative    19. Marsh Elder, Rough Negative    20. Mugwort, Common Negative    21. Box, Elder Negative    22. Cedar, red Negative    23. Sweet Gum Negative  24. Pecan Pollen Negative    25. Pine Mix Negative    26. Walnut, Black Pollen Negative    27. Red Mulberry Negative    28. Ash Mix Negative    29. Birch Mix Negative    30. Beech American Negative    31. Cottonwood, Eastern Negative    32. Hickory, White Negative    33. Maple Mix Negative    34. Oak, Eastern Mix Negative    35. Sycamore Eastern Negative    36. Alternaria Alternata Negative    37. Cladosporium Herbarum Negative    38. Aspergillus Mix 2+    39. Penicillium Mix 2+    40. Bipolaris Sorokiniana (Helminthosporium) 2+    41. Drechslera Spicifera (Curvularia) Negative    42. Mucor Plumbeus Negative    43. Fusarium Moniliforme Negative    44. Aureobasidium Pullulans (pullulara) Negative    45. Rhizopus Oryzae Negative    46. Botrytis Cinera Negative    47.  Epicoccum Nigrum Negative    48. Phoma Betae Negative    49. Dust Mite Mix Negative    50. Cat Hair 10,000 BAU/ml Negative    51.  Dog Epithelia Negative    52. Mixed Feathers Negative    53. Horse Epithelia Negative    54. Cockroach, German Negative    55. Tobacco Leaf Negative          Allergy  testing results were read and interpreted by provider, documented by clinical staff.   Assessment and plan: Allergic rhinitis Chronic allergic rhinitis with persistent rhinorrhea, exacerbated in spring and fall.  Previous allergy  testing indicated environmental allergen sensitivities. - Use azelastine  nasal spray for runny nose.  Use 1-2 sprays each nostril twice a day for runny nose control.   With using nasal sprays point tip of bottle toward eye on same side nostril and lean head slightly forward for best technique.   - Use Allegra 180mg  1 tab daily as needed for allergy  symptoms.  May need to take daily during pollen season - Testing today showed: ragweed, weeds, indoor molds, outdoor molds, and cat - Copy of test results provided.  - Avoidance measures provided. - Can consider allergy  shots as a means of long-term control. - Allergy  shots re-train and reset the immune system to ignore environmental allergens and decrease the resulting immune response to those allergens (sneezing, itchy watery eyes, runny nose, nasal congestion, etc).    - Allergy  shots improve symptoms in 75-85% of patients.  - We can discuss more at a future appointment if the medications are not working for you.   Bronchospasm with illness - If you have an upper respiratory illness then use Albuterol  inhaler 2 puffs every 4 hours as needed for cough, difficulty breathing, wheezing control.    Follow-up in 4-6 months or sooner if needed  I appreciate the opportunity to take part in [name]'s care. Please do not hesitate to contact me with questions.  Sincerely,   Danita Brain,  MD Allergy /Immunology [facility]       [1]  Allergies Allergen Reactions   Sulfa Antibiotics Nausea Only   Atenolol  Other (See Comments)    Dropped B/P too much   "

## 2024-05-02 NOTE — Patient Instructions (Signed)
 Allergic rhinitis Chronic allergic rhinitis with persistent rhinorrhea, exacerbated in spring and fall.  Previous allergy  testing indicated environmental allergen sensitivities. - Use azelastine  nasal spray for runny nose.  Use 1-2 sprays each nostril twice a day for runny nose control.   With using nasal sprays point tip of bottle toward eye on same side nostril and lean head slightly forward for best technique.   - Use Allegra 180mg  1 tab daily as needed for allergy  symptoms.  May need to take daily during pollen season - Testing today showed: ragweed, weeds, indoor molds, outdoor molds, and cat - Copy of test results provided.  - Avoidance measures provided. - Can consider allergy  shots as a means of long-term control. - Allergy  shots re-train and reset the immune system to ignore environmental allergens and decrease the resulting immune response to those allergens (sneezing, itchy watery eyes, runny nose, nasal congestion, etc).    - Allergy  shots improve symptoms in 75-85% of patients.  - We can discuss more at a future appointment if the medications are not working for you.   Bronchospasm with illness - If you have an upper respiratory illness then use Albuterol  inhaler 2 puffs every 4 hours as needed for cough, difficulty breathing, wheezing control.    Follow-up in 4-6 months or sooner if needed  Avoidance measures: Reducing Pollen Exposure  The American Academy of Allergy , Asthma and Immunology suggests the following steps to reduce your exposure to pollen during allergy  seasons.    Do not hang sheets or clothing out to dry; pollen may collect on these items. Do not mow lawns or spend time around freshly cut grass; mowing stirs up pollen. Keep windows closed at night.  Keep car windows closed while driving. Minimize morning activities outdoors, a time when pollen counts are usually at their highest. Stay indoors as much as possible when pollen counts or humidity is high and  on windy days when pollen tends to remain in the air longer. Use air conditioning when possible.  Many air conditioners have filters that trap the pollen spores. Use a HEPA room air filter to remove pollen form the indoor air you breathe.   Control of Mold Allergen   Mold and fungi can grow on a variety of surfaces provided certain temperature and moisture conditions exist.  Outdoor molds grow on plants, decaying vegetation and soil.  The major outdoor mold, Alternaria and Cladosporium, are found in very high numbers during hot and dry conditions.  Generally, a late Summer - Fall peak is seen for common outdoor fungal spores.  Rain will temporarily lower outdoor mold spore count, but counts rise rapidly when the rainy period ends.  The most important indoor molds are Aspergillus and Penicillium.  Dark, humid and poorly ventilated basements are ideal sites for mold growth.  The next most common sites of mold growth are the bathroom and the kitchen.  Outdoor (Seasonal) Mold Control Use air conditioning and keep windows closed Avoid exposure to decaying vegetation. Avoid leaf raking. Avoid grain handling. Consider wearing a face mask if working in moldy areas.   Indoor (Perennial) Mold Control  Maintain humidity below 50%. Clean washable surfaces with 5% bleach solution. Remove sources e.g. contaminated carpets.  Control of Dog or Cat Allergen  Avoidance is the best way to manage a dog or cat allergy . If you have a dog or cat and are allergic to dog or cats, consider removing the dog or cat from the home. If you have a dog or  cat but dont want to find it a new home, or if your family wants a pet even though someone in the household is allergic, here are some strategies that may help keep symptoms at bay:  Keep the pet out of your bedroom and restrict it to only a few rooms. Be advised that keeping the dog or cat in only one room will not limit the allergens to that room. Dont pet, hug or  kiss the dog or cat; if you do, wash your hands with soap and water . High-efficiency particulate air (HEPA) cleaners run continuously in a bedroom or living room can reduce allergen levels over time. Regular use of a high-efficiency vacuum cleaner or a central vacuum can reduce allergen levels. Giving your dog or cat a bath at least once a week can reduce airborne allergen.

## 2024-05-03 ENCOUNTER — Other Ambulatory Visit: Payer: Self-pay

## 2024-05-06 ENCOUNTER — Telehealth: Payer: Self-pay

## 2024-05-06 NOTE — Telephone Encounter (Signed)
 She only takes Mondays, Wednesdays and Fridays only until her appt next month to see me

## 2024-05-06 NOTE — Telephone Encounter (Signed)
 Called her back and given message to resume Lenvima  on Mondays, Wednesday and Fridays until seen in the office. She verbalized understanding.

## 2024-05-06 NOTE — Telephone Encounter (Signed)
 Returned her call. She called to give bp readings. 1/13 am bp 127/92 and pm bp 123/74 1/14 noon bp 99/97 and pm bp 112/67 1/15 am bp 115/66 and pm bp 142/68/ recheck 5 min later bp 132/70 1/16 did not check bp 1/17 pm bp 147/71 1/18 am bp 128/76 and  pm bp 120/68 1/19 am bp 136/64  She is asking when should she restart Lenvima ?

## 2024-05-22 ENCOUNTER — Encounter: Payer: Self-pay | Admitting: Podiatry

## 2024-05-22 ENCOUNTER — Ambulatory Visit: Admitting: Podiatry

## 2024-05-22 DIAGNOSIS — B353 Tinea pedis: Secondary | ICD-10-CM

## 2024-05-22 MED ORDER — CLOTRIMAZOLE-BETAMETHASONE 1-0.05 % EX CREA: TOPICAL_CREAM | CUTANEOUS | 1 refills | Status: AC

## 2024-05-22 NOTE — Progress Notes (Unsigned)
"  °  Subjective:  Patient ID: Krista Johnson, female    DOB: 01/30/1951,  MRN: [id number]  Krista Johnson presents to clinic today for {jgcomplaint:23593}  Chief Complaint  Patient presents with   Diabetes    [facility] NIDDM A1C 7.1. Toenail trim. LOV with PCP   New problem(s): None. {jgcomplaint:23593}  PCP is Verena Reena, MD.  Allergies[1]  Review of Systems: Negative except as noted in the HPI.  Objective: No changes noted in today's physical examination. There were no vitals filed for this visit. Krista Johnson is a pleasant 74 y.o. female {jgbodyhabitus:24098} AAO x 3.  {Perform Simple Foot Exam  Perform Detailed exam:1} {Insert foot Exam (Optional):30965}   Assessment/Plan: No diagnosis found.  No orders of the defined types were placed in this encounter.   None {Jgplan:23602::-Patient/POA to call should there be question/concern in the interim.}   No follow-ups on file.  Krista Johnson, DPM      [place] LOCATION: [address] [place] [address]                   Office [phone]   [facility] LOCATION: [address] [place], [place] [address] Office [phone]     [1]  Allergies Allergen Reactions   Sulfa Antibiotics Nausea Only   Atenolol  Other (See Comments)    Dropped B/P too much   "

## 2024-05-23 ENCOUNTER — Other Ambulatory Visit: Payer: Self-pay

## 2024-06-03 ENCOUNTER — Ambulatory Visit (HOSPITAL_COMMUNITY)

## 2024-06-10 ENCOUNTER — Inpatient Hospital Stay

## 2024-06-10 ENCOUNTER — Inpatient Hospital Stay: Admitting: Hematology and Oncology

## 2024-06-24 ENCOUNTER — Ambulatory Visit: Admitting: Gastroenterology

## 2024-08-09 ENCOUNTER — Ambulatory Visit: Admitting: Internal Medicine

## 2024-08-20 ENCOUNTER — Ambulatory Visit: Admitting: Podiatry

## 2024-10-31 ENCOUNTER — Ambulatory Visit: Admitting: Allergy

## 2024-11-13 ENCOUNTER — Ambulatory Visit: Admitting: Podiatry
